# Patient Record
Sex: Male | Born: 1955
Health system: Southern US, Community
[De-identification: ages and names within clinical notes are randomized; demographics above are authoritative.]

## PROBLEM LIST (undated history)

## (undated) DIAGNOSIS — R569 Unspecified convulsions: Secondary | ICD-10-CM

## (undated) DIAGNOSIS — H269 Unspecified cataract: Secondary | ICD-10-CM

## (undated) DIAGNOSIS — F329 Major depressive disorder, single episode, unspecified: Secondary | ICD-10-CM

## (undated) DIAGNOSIS — N179 Acute kidney failure, unspecified: Secondary | ICD-10-CM

## (undated) DIAGNOSIS — N183 Chronic kidney disease, stage 3 unspecified: Secondary | ICD-10-CM

## (undated) DIAGNOSIS — M1990 Unspecified osteoarthritis, unspecified site: Secondary | ICD-10-CM

## (undated) DIAGNOSIS — D649 Anemia, unspecified: Secondary | ICD-10-CM

## (undated) DIAGNOSIS — T7840XA Allergy, unspecified, initial encounter: Secondary | ICD-10-CM

## (undated) DIAGNOSIS — E785 Hyperlipidemia, unspecified: Secondary | ICD-10-CM

## (undated) DIAGNOSIS — Z87442 Personal history of urinary calculi: Secondary | ICD-10-CM

## (undated) DIAGNOSIS — G4733 Obstructive sleep apnea (adult) (pediatric): Secondary | ICD-10-CM

## (undated) DIAGNOSIS — F1910 Other psychoactive substance abuse, uncomplicated: Secondary | ICD-10-CM

## (undated) DIAGNOSIS — I951 Orthostatic hypotension: Secondary | ICD-10-CM

## (undated) DIAGNOSIS — J189 Pneumonia, unspecified organism: Secondary | ICD-10-CM

## (undated) DIAGNOSIS — G4730 Sleep apnea, unspecified: Secondary | ICD-10-CM

## (undated) DIAGNOSIS — F419 Anxiety disorder, unspecified: Secondary | ICD-10-CM

## (undated) DIAGNOSIS — F32A Depression, unspecified: Secondary | ICD-10-CM

## (undated) DIAGNOSIS — I10 Essential (primary) hypertension: Secondary | ICD-10-CM

## (undated) HISTORY — DX: Unspecified osteoarthritis, unspecified site: M19.90

## (undated) HISTORY — DX: Allergy, unspecified, initial encounter: T78.40XA

## (undated) HISTORY — DX: Acute kidney failure, unspecified: N17.9

## (undated) HISTORY — DX: Orthostatic hypotension: I95.1

## (undated) HISTORY — DX: Obstructive sleep apnea (adult) (pediatric): G47.33

## (undated) HISTORY — DX: Major depressive disorder, single episode, unspecified: F32.9

## (undated) HISTORY — DX: Sleep apnea, unspecified: G47.30

## (undated) HISTORY — DX: Unspecified cataract: H26.9

## (undated) HISTORY — PX: COLONOSCOPY: SHX174

## (undated) HISTORY — DX: Essential (primary) hypertension: I10

## (undated) HISTORY — DX: Anemia, unspecified: D64.9

## (undated) HISTORY — DX: Depression, unspecified: F32.A

## (undated) HISTORY — DX: Other psychoactive substance abuse, uncomplicated: F19.10

## (undated) HISTORY — DX: Unspecified convulsions: R56.9

## (undated) HISTORY — DX: Hyperlipidemia, unspecified: E78.5

## (undated) HISTORY — DX: Anxiety disorder, unspecified: F41.9

## (undated) HISTORY — PX: WISDOM TOOTH EXTRACTION: SHX21

---

## 1988-11-15 HISTORY — PX: FRACTURE SURGERY: SHX138

## 1988-11-15 HISTORY — PX: JOINT REPLACEMENT: SHX530

## 1999-08-16 HISTORY — PX: TRACHEOSTOMY: SUR1362

## 1999-10-03 ENCOUNTER — Inpatient Hospital Stay (HOSPITAL_COMMUNITY): Admission: EM | Admit: 1999-10-03 | Discharge: 1999-11-02 | Payer: Self-pay | Admitting: Emergency Medicine

## 1999-10-04 ENCOUNTER — Encounter: Payer: Self-pay | Admitting: Family Medicine

## 1999-10-04 ENCOUNTER — Encounter: Payer: Self-pay | Admitting: *Deleted

## 1999-10-05 ENCOUNTER — Encounter: Payer: Self-pay | Admitting: Pulmonary Disease

## 1999-10-05 ENCOUNTER — Encounter: Payer: Self-pay | Admitting: Gastroenterology

## 1999-10-05 ENCOUNTER — Encounter: Payer: Self-pay | Admitting: Family Medicine

## 1999-10-06 ENCOUNTER — Encounter: Payer: Self-pay | Admitting: Pulmonary Disease

## 1999-10-07 ENCOUNTER — Encounter: Payer: Self-pay | Admitting: Pulmonary Disease

## 1999-10-08 ENCOUNTER — Encounter: Payer: Self-pay | Admitting: Pulmonary Disease

## 1999-10-09 ENCOUNTER — Encounter: Payer: Self-pay | Admitting: Pulmonary Disease

## 1999-10-10 ENCOUNTER — Encounter: Payer: Self-pay | Admitting: Pulmonary Disease

## 1999-10-11 ENCOUNTER — Encounter: Payer: Self-pay | Admitting: Critical Care Medicine

## 1999-10-12 ENCOUNTER — Encounter: Payer: Self-pay | Admitting: Family Medicine

## 1999-10-13 ENCOUNTER — Encounter: Payer: Self-pay | Admitting: Critical Care Medicine

## 1999-10-13 ENCOUNTER — Encounter: Payer: Self-pay | Admitting: Pulmonary Disease

## 1999-10-14 ENCOUNTER — Encounter: Payer: Self-pay | Admitting: Pulmonary Disease

## 1999-10-14 ENCOUNTER — Encounter: Payer: Self-pay | Admitting: Otolaryngology

## 1999-10-15 ENCOUNTER — Encounter: Payer: Self-pay | Admitting: Interventional Cardiology

## 1999-10-15 ENCOUNTER — Encounter: Payer: Self-pay | Admitting: Pulmonary Disease

## 1999-10-16 ENCOUNTER — Encounter: Payer: Self-pay | Admitting: Pulmonary Disease

## 1999-10-17 ENCOUNTER — Encounter: Payer: Self-pay | Admitting: Family Medicine

## 1999-10-18 ENCOUNTER — Encounter: Payer: Self-pay | Admitting: Family Medicine

## 1999-10-19 ENCOUNTER — Encounter: Payer: Self-pay | Admitting: Pulmonary Disease

## 1999-10-20 ENCOUNTER — Encounter: Payer: Self-pay | Admitting: Family Medicine

## 1999-10-21 ENCOUNTER — Encounter: Payer: Self-pay | Admitting: Family Medicine

## 1999-10-22 ENCOUNTER — Encounter: Payer: Self-pay | Admitting: Pulmonary Disease

## 1999-10-22 ENCOUNTER — Encounter: Payer: Self-pay | Admitting: Family Medicine

## 1999-10-24 ENCOUNTER — Encounter: Payer: Self-pay | Admitting: Family Medicine

## 1999-10-26 ENCOUNTER — Encounter: Payer: Self-pay | Admitting: Family Medicine

## 1999-10-28 ENCOUNTER — Encounter: Payer: Self-pay | Admitting: Family Medicine

## 1999-11-02 ENCOUNTER — Encounter: Payer: Self-pay | Admitting: Family Medicine

## 1999-11-10 ENCOUNTER — Encounter: Admission: RE | Admit: 1999-11-10 | Discharge: 1999-11-10 | Payer: Self-pay | Admitting: Family Medicine

## 1999-11-12 ENCOUNTER — Encounter: Admission: RE | Admit: 1999-11-12 | Discharge: 1999-11-12 | Payer: Self-pay | Admitting: Family Medicine

## 1999-11-19 ENCOUNTER — Encounter: Admission: RE | Admit: 1999-11-19 | Discharge: 1999-11-19 | Payer: Self-pay | Admitting: Family Medicine

## 1999-11-24 ENCOUNTER — Encounter: Admission: RE | Admit: 1999-11-24 | Discharge: 1999-12-17 | Payer: Self-pay | Admitting: *Deleted

## 1999-12-11 ENCOUNTER — Encounter: Admission: RE | Admit: 1999-12-11 | Discharge: 1999-12-11 | Payer: Self-pay | Admitting: Family Medicine

## 1999-12-25 ENCOUNTER — Encounter: Admission: RE | Admit: 1999-12-25 | Discharge: 1999-12-25 | Payer: Self-pay | Admitting: Family Medicine

## 1999-12-31 ENCOUNTER — Encounter: Admission: RE | Admit: 1999-12-31 | Discharge: 1999-12-31 | Payer: Self-pay | Admitting: Family Medicine

## 2000-01-04 ENCOUNTER — Encounter: Admission: RE | Admit: 2000-01-04 | Discharge: 2000-01-04 | Payer: Self-pay | Admitting: Family Medicine

## 2000-01-08 ENCOUNTER — Encounter: Admission: RE | Admit: 2000-01-08 | Discharge: 2000-01-08 | Payer: Self-pay | Admitting: Family Medicine

## 2000-01-13 ENCOUNTER — Encounter: Payer: Self-pay | Admitting: Sports Medicine

## 2000-01-13 ENCOUNTER — Encounter: Admission: RE | Admit: 2000-01-13 | Discharge: 2000-01-13 | Payer: Self-pay | Admitting: *Deleted

## 2000-01-18 ENCOUNTER — Encounter: Admission: RE | Admit: 2000-01-18 | Discharge: 2000-01-18 | Payer: Self-pay | Admitting: Family Medicine

## 2000-01-25 ENCOUNTER — Encounter: Admission: RE | Admit: 2000-01-25 | Discharge: 2000-01-25 | Payer: Self-pay | Admitting: Family Medicine

## 2000-02-01 ENCOUNTER — Encounter: Admission: RE | Admit: 2000-02-01 | Discharge: 2000-02-01 | Payer: Self-pay | Admitting: Family Medicine

## 2000-02-11 ENCOUNTER — Encounter: Admission: RE | Admit: 2000-02-11 | Discharge: 2000-02-11 | Payer: Self-pay | Admitting: Family Medicine

## 2000-02-15 ENCOUNTER — Encounter: Admission: RE | Admit: 2000-02-15 | Discharge: 2000-02-15 | Payer: Self-pay | Admitting: Family Medicine

## 2000-02-19 ENCOUNTER — Encounter: Admission: RE | Admit: 2000-02-19 | Discharge: 2000-02-19 | Payer: Self-pay | Admitting: Family Medicine

## 2000-03-04 ENCOUNTER — Encounter: Admission: RE | Admit: 2000-03-04 | Discharge: 2000-03-04 | Payer: Self-pay | Admitting: Family Medicine

## 2000-07-04 ENCOUNTER — Encounter: Admission: RE | Admit: 2000-07-04 | Discharge: 2000-07-04 | Payer: Self-pay | Admitting: Family Medicine

## 2000-07-05 ENCOUNTER — Ambulatory Visit (HOSPITAL_COMMUNITY): Admission: RE | Admit: 2000-07-05 | Discharge: 2000-07-05 | Payer: Self-pay | Admitting: *Deleted

## 2000-07-06 ENCOUNTER — Encounter: Admission: RE | Admit: 2000-07-06 | Discharge: 2000-07-06 | Payer: Self-pay | Admitting: Family Medicine

## 2001-05-09 ENCOUNTER — Encounter: Admission: RE | Admit: 2001-05-09 | Discharge: 2001-05-09 | Payer: Self-pay | Admitting: Family Medicine

## 2001-05-31 ENCOUNTER — Ambulatory Visit (HOSPITAL_COMMUNITY): Admission: RE | Admit: 2001-05-31 | Discharge: 2001-05-31 | Payer: Self-pay | Admitting: *Deleted

## 2001-06-07 ENCOUNTER — Encounter: Admission: RE | Admit: 2001-06-07 | Discharge: 2001-06-07 | Payer: Self-pay | Admitting: Family Medicine

## 2002-09-01 ENCOUNTER — Emergency Department (HOSPITAL_COMMUNITY): Admission: EM | Admit: 2002-09-01 | Discharge: 2002-09-01 | Payer: Self-pay

## 2003-02-05 ENCOUNTER — Encounter: Payer: Self-pay | Admitting: *Deleted

## 2003-02-05 ENCOUNTER — Emergency Department (HOSPITAL_COMMUNITY): Admission: EM | Admit: 2003-02-05 | Discharge: 2003-02-06 | Payer: Self-pay | Admitting: Emergency Medicine

## 2003-11-27 ENCOUNTER — Encounter: Admission: RE | Admit: 2003-11-27 | Discharge: 2003-11-27 | Payer: Self-pay | Admitting: Family Medicine

## 2004-08-16 ENCOUNTER — Ambulatory Visit: Payer: Self-pay | Admitting: Family Medicine

## 2004-08-16 ENCOUNTER — Inpatient Hospital Stay (HOSPITAL_COMMUNITY): Admission: EM | Admit: 2004-08-16 | Discharge: 2004-08-19 | Payer: Self-pay | Admitting: Emergency Medicine

## 2004-09-04 ENCOUNTER — Ambulatory Visit: Payer: Self-pay | Admitting: Family Medicine

## 2004-09-17 ENCOUNTER — Ambulatory Visit: Payer: Self-pay | Admitting: Family Medicine

## 2004-10-01 ENCOUNTER — Emergency Department (HOSPITAL_COMMUNITY): Admission: EM | Admit: 2004-10-01 | Discharge: 2004-10-02 | Payer: Self-pay | Admitting: Emergency Medicine

## 2004-10-09 ENCOUNTER — Emergency Department (HOSPITAL_COMMUNITY): Admission: EM | Admit: 2004-10-09 | Discharge: 2004-10-09 | Payer: Self-pay | Admitting: Emergency Medicine

## 2004-10-19 ENCOUNTER — Emergency Department (HOSPITAL_COMMUNITY): Admission: EM | Admit: 2004-10-19 | Discharge: 2004-10-20 | Payer: Self-pay

## 2004-11-04 ENCOUNTER — Emergency Department (HOSPITAL_COMMUNITY): Admission: EM | Admit: 2004-11-04 | Discharge: 2004-11-05 | Payer: Self-pay | Admitting: Emergency Medicine

## 2004-11-04 ENCOUNTER — Ambulatory Visit: Payer: Self-pay | Admitting: Sports Medicine

## 2004-12-24 ENCOUNTER — Ambulatory Visit: Payer: Self-pay | Admitting: Family Medicine

## 2005-01-21 ENCOUNTER — Ambulatory Visit: Payer: Self-pay | Admitting: Family Medicine

## 2005-03-29 ENCOUNTER — Emergency Department (HOSPITAL_COMMUNITY): Admission: EM | Admit: 2005-03-29 | Discharge: 2005-03-30 | Payer: Self-pay | Admitting: Emergency Medicine

## 2005-07-29 ENCOUNTER — Ambulatory Visit: Payer: Self-pay | Admitting: Family Medicine

## 2005-09-28 ENCOUNTER — Ambulatory Visit: Payer: Self-pay | Admitting: Family Medicine

## 2005-10-27 ENCOUNTER — Ambulatory Visit: Payer: Self-pay | Admitting: Family Medicine

## 2005-10-29 ENCOUNTER — Ambulatory Visit: Payer: Self-pay | Admitting: Family Medicine

## 2005-12-01 ENCOUNTER — Ambulatory Visit: Payer: Self-pay | Admitting: Family Medicine

## 2005-12-09 ENCOUNTER — Ambulatory Visit: Payer: Self-pay | Admitting: Sports Medicine

## 2005-12-23 ENCOUNTER — Ambulatory Visit: Payer: Self-pay | Admitting: Family Medicine

## 2006-01-19 ENCOUNTER — Ambulatory Visit: Payer: Self-pay | Admitting: Family Medicine

## 2006-03-17 ENCOUNTER — Ambulatory Visit: Payer: Self-pay | Admitting: Family Medicine

## 2006-05-27 ENCOUNTER — Ambulatory Visit: Payer: Self-pay | Admitting: Family Medicine

## 2006-09-08 ENCOUNTER — Ambulatory Visit: Payer: Self-pay | Admitting: Family Medicine

## 2006-11-01 ENCOUNTER — Ambulatory Visit: Payer: Self-pay | Admitting: Sports Medicine

## 2007-01-12 DIAGNOSIS — F319 Bipolar disorder, unspecified: Secondary | ICD-10-CM | POA: Insufficient documentation

## 2007-01-12 DIAGNOSIS — G43909 Migraine, unspecified, not intractable, without status migrainosus: Secondary | ICD-10-CM | POA: Insufficient documentation

## 2007-01-12 DIAGNOSIS — F1010 Alcohol abuse, uncomplicated: Secondary | ICD-10-CM | POA: Insufficient documentation

## 2007-01-17 ENCOUNTER — Telehealth: Payer: Self-pay | Admitting: *Deleted

## 2007-03-01 ENCOUNTER — Ambulatory Visit: Payer: Self-pay | Admitting: Family Medicine

## 2007-03-06 ENCOUNTER — Telehealth: Payer: Self-pay | Admitting: Psychology

## 2007-03-10 ENCOUNTER — Telehealth (INDEPENDENT_AMBULATORY_CARE_PROVIDER_SITE_OTHER): Payer: Self-pay | Admitting: Family Medicine

## 2007-03-20 ENCOUNTER — Ambulatory Visit: Payer: Self-pay | Admitting: Psychology

## 2007-04-03 ENCOUNTER — Ambulatory Visit: Payer: Self-pay | Admitting: Family Medicine

## 2007-04-12 ENCOUNTER — Ambulatory Visit: Payer: Self-pay | Admitting: Family Medicine

## 2007-05-10 ENCOUNTER — Ambulatory Visit: Payer: Self-pay | Admitting: Family Medicine

## 2007-05-10 DIAGNOSIS — G40909 Epilepsy, unspecified, not intractable, without status epilepticus: Secondary | ICD-10-CM | POA: Insufficient documentation

## 2007-06-14 ENCOUNTER — Ambulatory Visit: Payer: Self-pay | Admitting: Family Medicine

## 2007-07-10 ENCOUNTER — Emergency Department (HOSPITAL_COMMUNITY): Admission: EM | Admit: 2007-07-10 | Discharge: 2007-07-10 | Payer: Self-pay | Admitting: Emergency Medicine

## 2007-07-29 ENCOUNTER — Inpatient Hospital Stay (HOSPITAL_COMMUNITY): Admission: EM | Admit: 2007-07-29 | Discharge: 2007-08-01 | Payer: Self-pay | Admitting: Emergency Medicine

## 2007-08-29 ENCOUNTER — Ambulatory Visit: Payer: Self-pay | Admitting: Family Medicine

## 2007-08-29 ENCOUNTER — Telehealth: Payer: Self-pay | Admitting: *Deleted

## 2007-08-29 DIAGNOSIS — R55 Syncope and collapse: Secondary | ICD-10-CM

## 2007-09-06 ENCOUNTER — Encounter (INDEPENDENT_AMBULATORY_CARE_PROVIDER_SITE_OTHER): Payer: Self-pay | Admitting: Family Medicine

## 2007-09-06 ENCOUNTER — Ambulatory Visit: Payer: Self-pay | Admitting: Family Medicine

## 2007-09-06 LAB — CONVERTED CEMR LAB
ALT: 14 units/L (ref 0–53)
AST: 12 units/L (ref 0–37)
Albumin: 4.9 g/dL (ref 3.5–5.2)
Alkaline Phosphatase: 51 units/L (ref 39–117)
BUN: 13 mg/dL (ref 6–23)
CO2: 21 meq/L (ref 19–32)
Calcium: 9.9 mg/dL (ref 8.4–10.5)
Chloride: 103 meq/L (ref 96–112)
Cholesterol: 185 mg/dL (ref 0–200)
Creatinine, Ser: 0.96 mg/dL (ref 0.40–1.50)
Glucose, Bld: 100 mg/dL — ABNORMAL HIGH (ref 70–99)
HCT: 42.6 % (ref 39.0–52.0)
HDL: 32 mg/dL — ABNORMAL LOW (ref >39)
Hemoglobin: 13.6 g/dL (ref 13.0–17.0)
LDL Cholesterol: 97 mg/dL (ref 0–99)
MCHC: 31.9 g/dL (ref 30.0–36.0)
MCV: 91.6 fL (ref 78.0–100.0)
Platelets: 411 10*3/uL — ABNORMAL HIGH (ref 150–400)
Potassium: 4.5 meq/L (ref 3.5–5.3)
RBC: 4.65 M/uL (ref 4.22–5.81)
RDW: 14.5 % — ABNORMAL HIGH (ref 11.5–14.0)
Sodium: 137 meq/L (ref 135–145)
TSH: 1.756 microintl units/mL (ref 0.350–5.50)
Total Bilirubin: 0.5 mg/dL (ref 0.3–1.2)
Total CHOL/HDL Ratio: 5.8
Total Protein: 7.5 g/dL (ref 6.0–8.3)
Triglycerides: 279 mg/dL — ABNORMAL HIGH (ref <150)
VLDL: 56 mg/dL — ABNORMAL HIGH (ref 0–40)
WBC: 9.5 10*3/uL (ref 4.0–10.5)

## 2007-10-09 ENCOUNTER — Ambulatory Visit: Payer: Self-pay | Admitting: Family Medicine

## 2007-10-09 DIAGNOSIS — E785 Hyperlipidemia, unspecified: Secondary | ICD-10-CM | POA: Insufficient documentation

## 2007-11-07 ENCOUNTER — Inpatient Hospital Stay (HOSPITAL_COMMUNITY): Admission: EM | Admit: 2007-11-07 | Discharge: 2007-11-14 | Payer: Self-pay | Admitting: Family Medicine

## 2007-11-07 ENCOUNTER — Ambulatory Visit: Payer: Self-pay | Admitting: Family Medicine

## 2007-11-07 ENCOUNTER — Encounter: Payer: Self-pay | Admitting: Emergency Medicine

## 2007-11-08 ENCOUNTER — Encounter: Payer: Self-pay | Admitting: Family Medicine

## 2007-12-14 ENCOUNTER — Ambulatory Visit: Payer: Self-pay | Admitting: Family Medicine

## 2007-12-14 DIAGNOSIS — F17200 Nicotine dependence, unspecified, uncomplicated: Secondary | ICD-10-CM | POA: Insufficient documentation

## 2008-01-02 ENCOUNTER — Ambulatory Visit (HOSPITAL_COMMUNITY): Admission: RE | Admit: 2008-01-02 | Discharge: 2008-01-02 | Payer: Self-pay | Admitting: Family Medicine

## 2008-01-03 ENCOUNTER — Ambulatory Visit: Payer: Self-pay

## 2008-01-04 ENCOUNTER — Telehealth (INDEPENDENT_AMBULATORY_CARE_PROVIDER_SITE_OTHER): Payer: Self-pay | Admitting: Family Medicine

## 2008-01-08 ENCOUNTER — Encounter (INDEPENDENT_AMBULATORY_CARE_PROVIDER_SITE_OTHER): Payer: Self-pay | Admitting: Family Medicine

## 2008-02-07 ENCOUNTER — Ambulatory Visit: Payer: Self-pay | Admitting: Family Medicine

## 2008-02-07 ENCOUNTER — Encounter (INDEPENDENT_AMBULATORY_CARE_PROVIDER_SITE_OTHER): Payer: Self-pay | Admitting: Family Medicine

## 2008-02-07 LAB — CONVERTED CEMR LAB
ALT: 32 units/L (ref 0–53)
AST: 24 units/L (ref 0–37)
Albumin: 4.3 g/dL (ref 3.5–5.2)
Alkaline Phosphatase: 38 units/L — ABNORMAL LOW (ref 39–117)
BUN: 12 mg/dL (ref 6–23)
CO2: 24 meq/L (ref 19–32)
Calcium: 9.5 mg/dL (ref 8.4–10.5)
Chloride: 108 meq/L (ref 96–112)
Cholesterol: 174 mg/dL (ref 0–200)
Creatinine, Ser: 0.84 mg/dL (ref 0.40–1.50)
Glucose, Bld: 88 mg/dL (ref 70–99)
HDL: 38 mg/dL — ABNORMAL LOW (ref >39)
LDL Cholesterol: 105 mg/dL — ABNORMAL HIGH (ref 0–99)
Potassium: 4.5 meq/L (ref 3.5–5.3)
Sodium: 145 meq/L (ref 135–145)
Total Bilirubin: 0.7 mg/dL (ref 0.3–1.2)
Total CHOL/HDL Ratio: 4.6
Total CK: 63 units/L (ref 7–232)
Total Protein: 6.6 g/dL (ref 6.0–8.3)
Triglycerides: 156 mg/dL — ABNORMAL HIGH (ref <150)
VLDL: 31 mg/dL (ref 0–40)

## 2008-02-08 ENCOUNTER — Encounter (INDEPENDENT_AMBULATORY_CARE_PROVIDER_SITE_OTHER): Payer: Self-pay | Admitting: Family Medicine

## 2008-02-12 ENCOUNTER — Telehealth (INDEPENDENT_AMBULATORY_CARE_PROVIDER_SITE_OTHER): Payer: Self-pay | Admitting: *Deleted

## 2008-08-21 ENCOUNTER — Ambulatory Visit: Payer: Self-pay | Admitting: Family Medicine

## 2008-11-27 ENCOUNTER — Ambulatory Visit: Payer: Self-pay | Admitting: Family Medicine

## 2009-04-10 ENCOUNTER — Encounter: Payer: Self-pay | Admitting: Family Medicine

## 2009-04-10 ENCOUNTER — Ambulatory Visit: Payer: Self-pay | Admitting: Family Medicine

## 2009-04-10 DIAGNOSIS — K439 Ventral hernia without obstruction or gangrene: Secondary | ICD-10-CM | POA: Insufficient documentation

## 2009-04-10 LAB — CONVERTED CEMR LAB
ALT: 32 units/L (ref 0–53)
AST: 26 units/L (ref 0–37)
Albumin: 4.4 g/dL (ref 3.5–5.2)
Alkaline Phosphatase: 63 units/L (ref 39–117)
BUN: 2 mg/dL — ABNORMAL LOW (ref 6–23)
CO2: 21 meq/L (ref 19–32)
Calcium: 9.5 mg/dL (ref 8.4–10.5)
Chloride: 98 meq/L (ref 96–112)
Cholesterol: 217 mg/dL — ABNORMAL HIGH (ref 0–200)
Creatinine, Ser: 0.8 mg/dL (ref 0.40–1.50)
Glucose, Bld: 123 mg/dL — ABNORMAL HIGH (ref 70–99)
HDL: 27 mg/dL — ABNORMAL LOW (ref >39)
LDL Cholesterol: 125 mg/dL — ABNORMAL HIGH (ref 0–99)
PSA: 0.53 ng/mL (ref 0.10–4.00)
Potassium: 4.2 meq/L (ref 3.5–5.3)
Sodium: 132 meq/L — ABNORMAL LOW (ref 135–145)
Total Bilirubin: 0.3 mg/dL (ref 0.3–1.2)
Total CHOL/HDL Ratio: 8
Total Protein: 7 g/dL (ref 6.0–8.3)
Triglycerides: 323 mg/dL — ABNORMAL HIGH (ref <150)
VLDL: 65 mg/dL — ABNORMAL HIGH (ref 0–40)

## 2009-05-06 ENCOUNTER — Encounter: Payer: Self-pay | Admitting: Family Medicine

## 2009-06-11 ENCOUNTER — Ambulatory Visit: Payer: Self-pay | Admitting: Family Medicine

## 2009-07-02 ENCOUNTER — Encounter: Payer: Self-pay | Admitting: Family Medicine

## 2009-07-16 HISTORY — PX: HERNIA REPAIR: SHX51

## 2009-07-23 ENCOUNTER — Ambulatory Visit (HOSPITAL_COMMUNITY): Admission: RE | Admit: 2009-07-23 | Discharge: 2009-07-23 | Payer: Self-pay | Admitting: General Surgery

## 2009-07-26 ENCOUNTER — Inpatient Hospital Stay (HOSPITAL_COMMUNITY): Admission: EM | Admit: 2009-07-26 | Discharge: 2009-08-01 | Payer: Self-pay | Admitting: Emergency Medicine

## 2009-07-26 ENCOUNTER — Encounter (INDEPENDENT_AMBULATORY_CARE_PROVIDER_SITE_OTHER): Payer: Self-pay | Admitting: General Surgery

## 2009-08-01 ENCOUNTER — Encounter: Payer: Self-pay | Admitting: Family Medicine

## 2009-09-08 ENCOUNTER — Encounter: Payer: Self-pay | Admitting: Family Medicine

## 2009-09-18 ENCOUNTER — Ambulatory Visit: Payer: Self-pay | Admitting: Family Medicine

## 2009-12-24 ENCOUNTER — Encounter: Payer: Self-pay | Admitting: Family Medicine

## 2010-01-23 ENCOUNTER — Encounter: Payer: Self-pay | Admitting: Family Medicine

## 2010-01-23 LAB — CONVERTED CEMR LAB
ALT: 13 units/L
AST: 28 units/L
Alkaline Phosphatase: 64 units/L
BUN: 8 mg/dL
CO2: 34 meq/L
Calcium: 9.9 mg/dL
Chloride: 79 meq/L
Creatinine, Ser: 1.4 mg/dL
GFR calc non Af Amer: 57 mL/min
Glucose, Bld: 93 mg/dL
HCT: 40.7 %
Hemoglobin: 14.4 g/dL
Hgb A1c MFr Bld: 5.1 %
MCHC: 35.3 g/dL
MCV: 85 fL
Platelets: 379 10*3/uL
Potassium: 3.6 meq/L
RBC: 4.8 M/uL
RDW: 12.2 %
Sodium: 122 meq/L
TSH: 2.72 microintl units/mL
Total Bilirubin: 0.6 mg/dL
Total Protein: 8 g/dL
WBC: 8.2 10*3/uL

## 2010-01-26 ENCOUNTER — Telehealth: Payer: Self-pay | Admitting: Family Medicine

## 2010-01-29 ENCOUNTER — Telehealth: Payer: Self-pay | Admitting: Family Medicine

## 2010-03-04 ENCOUNTER — Encounter: Payer: Self-pay | Admitting: Family Medicine

## 2010-05-12 ENCOUNTER — Ambulatory Visit: Payer: Self-pay | Admitting: Family Medicine

## 2010-05-12 DIAGNOSIS — H938X9 Other specified disorders of ear, unspecified ear: Secondary | ICD-10-CM | POA: Insufficient documentation

## 2010-05-14 ENCOUNTER — Telehealth: Payer: Self-pay | Admitting: Family Medicine

## 2010-05-28 ENCOUNTER — Encounter: Payer: Self-pay | Admitting: Family Medicine

## 2010-05-28 ENCOUNTER — Ambulatory Visit: Payer: Self-pay | Admitting: Family Medicine

## 2010-09-03 ENCOUNTER — Encounter: Payer: Self-pay | Admitting: Family Medicine

## 2010-09-03 ENCOUNTER — Ambulatory Visit: Payer: Self-pay | Admitting: Family Medicine

## 2010-09-03 LAB — CONVERTED CEMR LAB
Cholesterol: 212 mg/dL — ABNORMAL HIGH (ref 0–200)
HDL: 32 mg/dL — ABNORMAL LOW (ref >39)
LDL Cholesterol: 122 mg/dL — ABNORMAL HIGH (ref 0–99)
Total CHOL/HDL Ratio: 6.6
Triglycerides: 289 mg/dL — ABNORMAL HIGH (ref <150)
VLDL: 58 mg/dL — ABNORMAL HIGH (ref 0–40)

## 2010-09-11 ENCOUNTER — Encounter: Payer: Self-pay | Admitting: Family Medicine

## 2010-09-11 ENCOUNTER — Telehealth (INDEPENDENT_AMBULATORY_CARE_PROVIDER_SITE_OTHER): Payer: Self-pay | Admitting: *Deleted

## 2010-09-15 ENCOUNTER — Encounter: Payer: Self-pay | Admitting: Family Medicine

## 2010-10-13 ENCOUNTER — Encounter (INDEPENDENT_AMBULATORY_CARE_PROVIDER_SITE_OTHER): Payer: Self-pay | Admitting: *Deleted

## 2010-12-02 ENCOUNTER — Ambulatory Visit: Admission: RE | Admit: 2010-12-02 | Discharge: 2010-12-02 | Payer: Self-pay | Source: Home / Self Care

## 2010-12-17 NOTE — Miscellaneous (Signed)
Summary: Medical record request  Clinical Lists Changes  Rec'd medical record request to go to: [facility] date sent: 08/05/10 Marily Memos  October 13, 2010 3:24 PM
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
## 2010-12-17 NOTE — Progress Notes (Signed)
Summary: Rx Req  Phone Note Refill Request Call back at [phone] Message from:  Patient  Refills Requested: Medication #1:  IBUPROFEN 600 MG TABS one tab by mouth q6 hours as needed headache. [facility].  Initial call taken by: Clydell Hakim,  May 14, 2010 9:56 AM    Prescriptions: IBUPROFEN 600 MG TABS (IBUPROFEN) one tab by mouth q6 hours as needed headache.  #90 x 3   Entered and Authorized by:   Lequita Asal  MD   Signed by:   Lequita Asal  MD on 05/14/2010   Method used:   Electronically to        [facility]  [phone]* (retail)       [address]       [place], [place]  [address]       Ph: [phone] or [phone]       Fax: [phone]   RxID:   [id number]

## 2010-12-17 NOTE — Assessment & Plan Note (Signed)
Summary: F/U   Vital Signs:  Patient profile:   55 year old male Height:      98.6 inches Weight:      196.5 pounds BMI:     14.26 Temp:     98.6 degrees F oral Pulse rate:   104 / minute BP sitting:   126 / 86  (left arm) Cuff size:   regular  Vitals Entered By: Barnabas Lister  MD (May 28, 2010 3:16 PM) CC: F/U Ears Is Patient Diabetic? No Pain Assessment Patient in pain? no        Primary Provider:  Barnabas Lister  MD  CC:  F/U Ears.  History of Present Illness: CC: F/u ear discomfort from 2 weeks ago, improved  HPI: Pt came to clinic 2 weeks ago for ear discomfort.  C/o ringing and itchiness.  Pt was given Zytrec 10mg  by mouth once a day.  Pt says medication has improved his symptoms and says he is doing well.  ROS: Denies headache, ear pain, ear discharge, hearing impairment, sinus congestion.  Current Medications (verified): 1)  Depakote 500 Mg  Tbec (Divalproex Sodium) .Marland Kitchen.. 1 Tabs By Mouth Bid 2)  Venlafaxine Hcl 75 Mg Tabs (Venlafaxine Hcl) .... One Tab By Mouth By Mouth Bid 3)  Thiamine Hcl 100 Mg  Tabs (Thiamine Hcl) .Marland Kitchen.. 1 By Mouth Daily 4)  Mens Multivitamin Plus   Tabs (Multiple Vitamins-Minerals) .Marland Kitchen.. 1 By Mouth Daily 5)  Benztropine Mesylate 1 Mg  Tabs (Benztropine Mesylate) .Marland Kitchen.. 1 By Mouth Bid 6)  Ibuprofen 600 Mg Tabs (Ibuprofen) .... One Tab By Mouth Q6 Hours As Needed Headache. 7)  Keppra 750 Mg Tabs (Levetiracetam) .... One Tab By Mouth Bid 8)  Risperdal 2 Mg Tabs (Risperidone) .... One Tab By Mouth Qhs 9)  Risperdal 1 Mg Tabs (Risperidone) .... One Tab By Mouth Qam 10)  Cetirizine Hcl 10 Mg Tabs (Cetirizine Hcl) .... One Tab By Mouth Daily (Same As Zyrtec)  Allergies (verified): 1)  ! Erythromycin  Review of Systems       per hpi  Physical Exam  General:  Well-developed,well-nourished,in no acute distress; alert,appropriate and cooperative throughout examination Head:  Normocephalic and atraumatic without obvious abnormalities. No apparent  alopecia or balding. Ears:  External ear exam shows no significant lesions or deformities.  Otoscopic examination reveals clear canals, tympanic membranes are intact bilaterally without bulging, retraction, inflammation or discharge. Hearing is grossly normal bilaterally.   Impression & Recommendations:  Problem # 1:  OTHER DISORDERS OF EAR (ICD-388.8) Assessment Improved  Pt states the ear discomfort has improved.  Discussed with patient the results of his hearing exam which was normal.  Patient denied any ear tenderness, itching, or discharge.  Patient will continue to take Zyrtec 10 mg by mouth daily.  Advised to return to clinic if ear symptoms return or symptoms become worse.    Orders: [facility]- Est Level  3 ([address])  Complete Medication List: 1)  Depakote 500 Mg Tbec (Divalproex sodium) .Marland Kitchen.. 1 tabs by mouth bid 2)  Venlafaxine Hcl 75 Mg Tabs (Venlafaxine hcl) .... One tab by mouth by mouth bid 3)  Thiamine Hcl 100 Mg Tabs (Thiamine hcl) .Marland Kitchen.. 1 by mouth daily 4)  Mens Multivitamin Plus Tabs (Multiple vitamins-minerals) .Marland Kitchen.. 1 by mouth daily 5)  Benztropine Mesylate 1 Mg Tabs (Benztropine mesylate) .Marland Kitchen.. 1 by mouth bid 6)  Ibuprofen 600 Mg Tabs (Ibuprofen) .... One tab by mouth q6 hours as needed headache. 7)  Keppra  750 Mg Tabs (Levetiracetam) .... One tab by mouth bid 8)  Risperdal 2 Mg Tabs (Risperidone) .... One tab by mouth qhs 9)  Risperdal 1 Mg Tabs (Risperidone) .... One tab by mouth qam 10)  Cetirizine Hcl 10 Mg Tabs (Cetirizine hcl) .... One tab by mouth daily (same as zyrtec)  Patient Instructions: 1)  Pt to return if ear discomfort returns or becomes worse. 2)  Advised to make an appointment with PCP to discuss preventative medicine and smoking cessation.

## 2010-12-17 NOTE — Assessment & Plan Note (Signed)
Summary: f/u & flu shot,df   Vital Signs:  Patient profile:   55 year old male Height:      98.6 inches Weight:      190.8 pounds BMI:     13.85 Temp:     98.1 degrees F oral Pulse rate:   92 / minute BP sitting:   149 / 75  (left arm) Cuff size:   regular  Vitals Entered By: Garen Grams LPN (September 03, 2010 1:56 PM) CC: f/u Is Patient Diabetic? No Pain Assessment Patient in pain? no        Primary Provider:  Barnabas Lister  MD  CC:  f/u.  History of Present Illness: This is a 55 yo AAM who is here for his flu shot and to get his cholesterol levels checked.  His last FLP was over one year ago and his cholesterol, TG, and LDL were high, and HDL low.  Dr.  Lanier Prude that he advised to watch his diet, especially carbs, to help reduce his elevated TG.  Pt does not have any complaints.  No hospitalizations since last visit.  Needs refill for Ibuprofen 600 for low back pain.  At last visit, pt c/o ringing and pain in his ear.  He says this has improved with Zyrtec.  Pt has no other complaints.  ROS: Denies CP, SOB, fever, chills, NS.  Denies N/V, constipation/diarrhea, abdominal pain, dysuria.  Does endorse low back pain and headaches.  Preventive Screening-Counseling & Management  Alcohol-Tobacco     Smoking Status: current     Smoking Cessation Counseling: yes     Packs/Day: 1.5     Tobacco Counseling: to quit use of tobacco products  Current Medications (verified): 1)  Venlafaxine Hcl 75 Mg Tabs (Venlafaxine Hcl) .... One Tab By Mouth By Mouth Bid 2)  Thiamine Hcl 100 Mg  Tabs (Thiamine Hcl) .Marland Kitchen.. 1 By Mouth Daily 3)  Mens Multivitamin Plus   Tabs (Multiple Vitamins-Minerals) .Marland Kitchen.. 1 By Mouth Daily 4)  Benztropine Mesylate 1 Mg  Tabs (Benztropine Mesylate) .Marland Kitchen.. 1 By Mouth Bid 5)  Ibuprofen 600 Mg Tabs (Ibuprofen) .... One Tab By Mouth Q6 Hours As Needed Headache. 6)  Keppra 500 Mg Tabs (Levetiracetam) .... 2 Tab By Mouth Bid 7)  Risperdal 2 Mg Tabs (Risperidone) .... One Tab  By Mouth Qhs 8)  Risperdal 1 Mg Tabs (Risperidone) .... One Tab By Mouth Qam 9)  Cetirizine Hcl 10 Mg Tabs (Cetirizine Hcl) .... One Tab By Mouth Daily (Same As Zyrtec)  Allergies: 1)  ! Erythromycin  Review of Systems       per HPI  Physical Exam  General:  Well-developed,well-nourished,in no acute distress Lungs:  Normal respiratory effort, chest expands symmetrically. Lungs are clear to auscultation, no crackles or wheezes. Heart:  Normal rate and regular rhythm. S1 and S2 normal without gallop, murmur, click, rub or other extra sounds. Extremities:  No clubbing, cyanosis, edema Neurologic:  No cranial nerve deficits noted. Station and gait are normal.   Impression & Recommendations:  Problem # 1:  HYPERLIPIDEMIA (ICD-272.4) Most recent FLP showed elevated TG and total cholesterol, and low HDL.  Advised pt to continue to diet and increase physical activity.  WIll get a FLP today, and call patient with results.  Will decide if pt needs to take meds for hyperlipidemia.  Currently, he does not take any statins ro fish oils.  Orders: Lipid-[facility] ([id number]) [facility]- Est Level  3 ([address])Future Orders: Comp Met-[facility] ([id number]) ... 09/10/2011 A1C-[facility] (  [address]) ... 09/10/2011  Problem # 2:  TOBACCO ABUSE (ICD-305.1) Discussed the importance of cutting down on tobacco use.  Pt still packs 2 packs/week.  Pt says he is not ready to quit smoking.  Advised pt of the many resources available to him in the [facility] clinic.  He would like to discuss this further at his next visit.    Complete Medication List: 1)  Venlafaxine Hcl 75 Mg Tabs (Venlafaxine hcl) .... One tab by mouth by mouth bid 2)  Thiamine Hcl 100 Mg Tabs (Thiamine hcl) .Marland Kitchen.. 1 by mouth daily 3)  Mens Multivitamin Plus Tabs (Multiple vitamins-minerals) .Marland Kitchen.. 1 by mouth daily 4)  Benztropine Mesylate 1 Mg Tabs (Benztropine mesylate) .Marland Kitchen.. 1 by mouth bid 5)  Ibuprofen 600 Mg Tabs (Ibuprofen) .... One tab by mouth q6 hours as needed  headache. 6)  Keppra 500 Mg Tabs (Levetiracetam) .... 2 tab by mouth bid 7)  Risperdal 2 Mg Tabs (Risperidone) .... One tab by mouth qhs 8)  Risperdal 1 Mg Tabs (Risperidone) .... One tab by mouth qam 9)  Cetirizine Hcl 10 Mg Tabs (Cetirizine hcl) .... One tab by mouth daily (same as zyrtec)  Other Orders: Future Orders: CBC-[facility] ([address]454) ... 09/10/2011  Patient Instructions: 1)  It was great to see you today. 2)  Please schedule a complete physical exam in March 2012. 3)  Please schedule a lab appointment for blood work 1 week prior to your CPE. 4)  Thanks! Prescriptions: IBUPROFEN 600 MG TABS (IBUPROFEN) one tab by mouth q6 hours as needed headache.  #90 x 3   Entered and Authorized by:   Ivy de Lawson Radar  MD   Signed by:   Barnabas Lister  MD on 09/03/2010   Method used:   Print then Give to Patient   RxID:   [id number]    Orders Added: 1)  Lipid-[facility] [[id number]] 2)  Comp Met-[facility] [[id number]] 3)  A1C-[facility] [83036] 4)  CBC-[facility] [85027] 5)  [facility]- Est Level  3 [62952]

## 2010-12-17 NOTE — Progress Notes (Signed)
Summary: triage  Phone Note Call from Patient Call back at [phone]   Caller: Patient Summary of Call: Pt needs to talk to a nurse about an adjustment that the [facility] has requested for him to go from 3 a day to just 1 a day. Initial call taken by: Clydell Hakim,  January 26, 2010 3:25 PM  Follow-up for Phone Call        left message Follow-up by: Golden Circle RN,  January 26, 2010 3:27 PM  Additional Follow-up for Phone Call Additional follow up Details #1::        [facility] wants his dose changed of the effexor. They are faxing info about that. told him I will forward it to his md when we receive it Additional Follow-up by: Golden Circle RN,  January 26, 2010 3:40 PM
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
## 2010-12-17 NOTE — Progress Notes (Signed)
Summary: refill  Phone Note Refill Request Call back at [phone] Message from:  Patient  Refills Requested: Medication #1:  CETIRIZINE HCL 10 MG TABS one tab by mouth daily (same as zyrtec). Initial call taken by: De Nurse,  September 11, 2010 8:53 AM  Follow-up for Phone Call        patient notified that Rx has been sent.. Follow-up by: Theresia Lo RN,  September 11, 2010 10:39 AM

## 2010-12-17 NOTE — Consult Note (Signed)
Summary: [facility]  [facility]   Imported By: Knox Royalty 09/18/2010 09:37:51  _____________________________________________________________________  External Attachment:    Type:   Image     Comment:   External Document

## 2010-12-17 NOTE — Letter (Signed)
Summary: Generic Letter  [facility]  [address]   [place], [place] [address]   Phone: [phone]  Fax: [phone]    09/15/2010  Jose Li [address] [place], [place]  [address]  Dear Jose Li,  I hope this letter finds you well.  I have reviewed the results of your most recent lab work.  Your total cholesterol and triglycerides are elevated.  However, your LDL (bad cholesterol) is within normal limits.  At this time, I would encourage you to avoid foods that are high in cholesterol and high in sugars/carbohydrates.  Also, please increase your physical activity as tolerated.  We can discuss other lifesyle modifications at your next office visit.  Tests: (1) Lipid Profile (69629)   Cholesterol          [H]  212 mg/dL                   5-284     ATP III Classification:           < 200        mg/dL        Desirable          200 - 239     mg/dL        Borderline High          >= 240        mg/dL        High         Triglyceride         [H]  289 mg/dL                   <132   HDL Cholesterol      [L]  32 mg/dL                    >44   Total Chol/HDL Ratio      6.6 Ratio  VLDL Cholesterol (Calc)                        [H]  58 mg/dL                    0-10  LDL Cholesterol (Calc)                        [H]  122 mg/dL                   2-72      Sincerely,   Ivy de Lawson Radar  MD  Appended Document: Generic Letter letter mailed

## 2010-12-17 NOTE — Progress Notes (Signed)
Summary: triage  Phone Note Call from Patient Call back at [phone]   Caller: Patient Summary of Call: Checking on the medication that hopefully will be at a reduced price. Initial call taken by: Clydell Hakim,  January 29, 2010 2:18 PM  Follow-up for Phone Call        he wants to be notified when this is done. told him his pcp will be here in am & will handle.Golden Circle RN  January 29, 2010 2:37 PM  Follow-up by: Golden Circle RN,  January 29, 2010 2:35 PM  Additional Follow-up for Phone Call Additional follow up Details #1::        not sure what patient is referring to. only recent correspondence had to do with effexor, and i dont prescribe that medication. Additional Follow-up by: Lequita Asal  MD,  January 29, 2010 11:05 PM    Additional Follow-up for Phone Call Additional follow up Details #2::    Dr. Manson Passey at [facility] center & a nurse Madaline Savage told him to get med from pcp from now on & that pcp could change if desired for lower cost to pcp Follow-up by: Golden Circle RN,  January 30, 2010 9:29 AM  Additional Follow-up for Phone Call Additional follow up Details #3:: Details for Additional Follow-up Action Taken: effexor changed immediate release formulation (which is less expensive) 50 mg twice a day. patient will need to have BMP in 4-6 weeks to evaluate sodium level (which is reason for medication adjustment). i am unwilling to change to another medication all together because patient has been tried on several in the past.  Additional Follow-up by: Lequita Asal  MD,  January 30, 2010 10:08 AM  New/Updated Medications: VENLAFAXINE HCL 50 MG TABS (VENLAFAXINE HCL) one tab by mouth bid Prescriptions: VENLAFAXINE HCL 50 MG TABS (VENLAFAXINE HCL) one tab by mouth bid  #60 x 3   Entered and Authorized by:   Lequita Asal  MD   Signed by:   Lequita Asal  MD on 01/30/2010   Method used:   Electronically to        [facility]  [phone]* (retail)       [address]       [place], [place]  [address]       Ph: [phone] or [phone]       Fax: [phone]   RxID:   [id number]  pt informed of above.Golden Circle RN  January 30, 2010 11:33 AM

## 2010-12-17 NOTE — Miscellaneous (Signed)
  Clinical Lists Changes  Medications: Removed medication of AMBIEN 10 MG  TABS (ZOLPIDEM TARTRATE) 1 by mouth at bedtime prn

## 2010-12-17 NOTE — Consult Note (Signed)
Summary: [facility]   [facility]   Imported By: Clydell Hakim 03/05/2010 12:04:22  _____________________________________________________________________  External Attachment:    Type:   Image     Comment:   External Document

## 2010-12-17 NOTE — Miscellaneous (Signed)
Summary: Labs  Clinical Lists Changes  Observations: Added new observation of TSH: 2.720 microintl units/mL (01/23/2010 8:57) Added new observation of HGBA1C: 5.1 % (01/23/2010 8:57) Added new observation of PLATELETK/UL: 379 K/uL (01/23/2010 8:57) Added new observation of RDW: 12.2 % (01/23/2010 8:57) Added new observation of MCHC RBC: 35.3 g/dL (13/06/6577 4:69) Added new observation of MCV: 85 fL (01/23/2010 8:57) Added new observation of HCT: 40.7 % (01/23/2010 8:57) Added new observation of HGB: 14.4 g/dL (62/95/2841 3:24) Added new observation of RBC M/UL: 4.8 M/uL (01/23/2010 8:57) Added new observation of WBC COUNT: 8.2 10*3/microliter (01/23/2010 8:57) Added new observation of CALCIUM: 9.9 mg/dL (40/08/2724 3:66) Added new observation of PROTEIN, TOT: 8.0 g/dL (44/01/4741 5:95) Added new observation of SGPT (ALT): 13 units/L (01/23/2010 8:57) Added new observation of SGOT (AST): 28 units/L (01/23/2010 8:57) Added new observation of ALK PHOS: 64 units/L (01/23/2010 8:57) Added new observation of BILI TOTAL: 0.6 mg/dL (63/87/5643 3:29) Added new observation of GFR: 57 mL/min (01/23/2010 8:57) Added new observation of CREATININE: 1.4 mg/dL (51/88/4166 0:63) Added new observation of BUN: 8 mg/dL (01/60/1093 2:35) Added new observation of BG RANDOM: 93 mg/dL (57/32/2025 4:27) Added new observation of CO2 PLSM/SER: 34 meq/L (01/23/2010 8:57) Added new observation of CL SERUM: 79 meq/L (01/23/2010 8:57) Added new observation of K SERUM: 3.6 meq/L (01/23/2010 8:57) Added new observation of NA: 122 meq/L (01/23/2010 8:57)

## 2011-02-11 ENCOUNTER — Ambulatory Visit (INDEPENDENT_AMBULATORY_CARE_PROVIDER_SITE_OTHER): Payer: Medicaid Other | Admitting: Family Medicine

## 2011-02-11 ENCOUNTER — Encounter: Payer: Self-pay | Admitting: Family Medicine

## 2011-02-11 VITALS — BP 121/80 | HR 104 | Temp 97.2°F | Ht 72.0 in | Wt 204.0 lb

## 2011-02-11 DIAGNOSIS — Z0000 Encounter for general adult medical examination without abnormal findings: Secondary | ICD-10-CM

## 2011-02-11 MED ORDER — ATORVASTATIN CALCIUM 20 MG PO TABS: 20.0000 mg | ORAL_TABLET | Freq: Every day | ORAL | Status: DC

## 2011-02-11 MED ORDER — PROPRANOLOL HCL ER 60 MG PO CP24: 60.0000 mg | ORAL_CAPSULE | Freq: Every day | ORAL | Status: DC

## 2011-02-11 NOTE — Patient Instructions (Signed)
It was great to see you today. Please schedule an appointment with me in 6 weeks to follow-up migraines. Two new prescriptions have been sent to your pharmacy. Please take Lipitor for high cholesterol as directed. Please take Inderal to prevent migraines as directed. Please call me if you have any questions or concerns. Thanks, Dr. Sherron Flemings Sondra Come

## 2011-02-12 ENCOUNTER — Telehealth: Payer: Self-pay | Admitting: *Deleted

## 2011-02-12 ENCOUNTER — Telehealth: Payer: Self-pay | Admitting: Family Medicine

## 2011-02-12 NOTE — Telephone Encounter (Signed)
Needs to get PA for his Lipitor - [facility]- [address]

## 2011-02-12 NOTE — Telephone Encounter (Signed)
PA required for Atorvastatin. Form placed in MD box. 

## 2011-02-15 DIAGNOSIS — Z0000 Encounter for general adult medical examination without abnormal findings: Secondary | ICD-10-CM | POA: Insufficient documentation

## 2011-02-15 NOTE — Assessment & Plan Note (Signed)
Patient's FLP: cholesterol 218, TG 326, HDL 27, LDL 126.  Will start statin today and will repeat FLP in 6 months.  Patient does not want to schedule a colonscopy at this time.  Will discuss importance again at next visit.  Patient's bipolar DO is managed by Psychiatry - seems stable.  Epilepsy stable on current meds.  Migraine HA still bothersome.  He was taking Motrin 3 days/week for HA.  Will start Inderal today and follow-up in 4-6 weeks.   If this does not prevent HA, may consider Topamax low dose.  Will follow.

## 2011-02-15 NOTE — Progress Notes (Signed)
  Subjective:    Patient ID: Jose Li, male    DOB: 1956/05/25, 55 y.o.   MRN: [id number]  HPI This is a 55 yo male who is here for an annual physical.  Patient has no complaints today.  He denies any recent illnesses, ED visits or hospitalizations.  He recently had lab work done and wants to review it with PCP.  Patient continues to smoke 1/2 ppd and has no desire to quit at this time.  In the past, patient hesitated to start taking any statins.  He does not want to schedule a colonoscopy yet.       Review of Systems Denies any fever, chills, NS, CP, SOB.  Denies HA, cough, weight loss, constipation/diarrhea, or bloody stool.    Objective:   Physical Exam  Constitutional: He appears well-developed and well-nourished.  HENT:  Head: Normocephalic and atraumatic.  Mouth/Throat: No oropharyngeal exudate.  Eyes: EOM are normal. Pupils are equal, round, and reactive to light.  Neck: Normal range of motion. Neck supple.  Cardiovascular: Normal rate and regular rhythm.  Exam reveals no gallop and no friction rub.   No murmur heard. Pulmonary/Chest: Effort normal and breath sounds normal. He has no wheezes. He has no rales.  Abdominal: Soft. Bowel sounds are normal. He exhibits distension. There is no tenderness. There is no rebound and no guarding.  Musculoskeletal: He exhibits no edema.  Lymphadenopathy:    He has no cervical adenopathy.  Skin: Skin is warm and dry.          Assessment & Plan:

## 2011-02-17 NOTE — Telephone Encounter (Signed)
Approval received . Form placed in MD box.

## 2011-02-17 NOTE — Telephone Encounter (Signed)
Approval recieved . Pharmacy notified.

## 2011-02-19 LAB — COMPREHENSIVE METABOLIC PANEL
ALT: 80 U/L — ABNORMAL HIGH (ref 0–53)
AST: 51 U/L — ABNORMAL HIGH (ref 0–37)
AST: 26 U/L (ref 0–37)
Albumin: 2.7 g/dL — ABNORMAL LOW (ref 3.5–5.2)
Albumin: 2.9 g/dL — ABNORMAL LOW (ref 3.5–5.2)
Albumin: 3.5 g/dL (ref 3.5–5.2)
Alkaline Phosphatase: 58 U/L (ref 39–117)
Alkaline Phosphatase: 66 U/L (ref 39–117)
Alkaline Phosphatase: 37 U/L — ABNORMAL LOW (ref 39–117)
BUN: 6 mg/dL (ref 6–23)
BUN: 7 mg/dL (ref 6–23)
BUN: 12 mg/dL (ref 6–23)
BUN: 13 mg/dL (ref 6–23)
BUN: 3 mg/dL — ABNORMAL LOW (ref 6–23)
CO2: 22 mEq/L (ref 19–32)
CO2: 22 mEq/L (ref 19–32)
CO2: 25 mEq/L (ref 19–32)
Calcium: 8.8 mg/dL (ref 8.4–10.5)
Calcium: 8 mg/dL — ABNORMAL LOW (ref 8.4–10.5)
Chloride: 100 mEq/L (ref 96–112)
Chloride: 101 mEq/L (ref 96–112)
Creatinine, Ser: 0.7 mg/dL (ref 0.4–1.5)
Creatinine, Ser: 0.74 mg/dL (ref 0.4–1.5)
Creatinine, Ser: 0.83 mg/dL (ref 0.4–1.5)
Creatinine, Ser: 0.85 mg/dL (ref 0.4–1.5)
Creatinine, Ser: 0.87 mg/dL (ref 0.4–1.5)
GFR calc Af Amer: >60 mL/min (ref >60)
GFR calc Af Amer: >60 mL/min (ref >60)
GFR calc non Af Amer: >60 mL/min (ref >60)
GFR calc non Af Amer: >60 mL/min (ref >60)
GFR calc non Af Amer: >60 mL/min (ref >60)
Glucose, Bld: 90 mg/dL (ref 70–99)
Glucose, Bld: 85 mg/dL (ref 70–99)
Glucose, Bld: 98 mg/dL (ref 70–99)
Potassium: 3.6 mEq/L (ref 3.5–5.1)
Potassium: 4.1 mEq/L (ref 3.5–5.1)
Potassium: 3.5 mEq/L (ref 3.5–5.1)
Sodium: 130 mEq/L — ABNORMAL LOW (ref 135–145)
Total Bilirubin: 0.6 mg/dL (ref 0.3–1.2)
Total Bilirubin: 0.7 mg/dL (ref 0.3–1.2)
Total Protein: 6.2 g/dL (ref 6.0–8.3)
Total Protein: 5.9 g/dL — ABNORMAL LOW (ref 6.0–8.3)
Total Protein: 6.3 g/dL (ref 6.0–8.3)

## 2011-02-19 LAB — VALPROIC ACID LEVEL
Valproic Acid Lvl: 91.5 ug/mL (ref 50.0–100.0)
Valproic Acid Lvl: 83 ug/mL (ref 50.0–100.0)

## 2011-02-19 LAB — CARDIAC PANEL(CRET KIN+CKTOT+MB+TROPI)
Relative Index: 1.5 (ref 0.0–2.5)
Relative Index: 1.6 (ref 0.0–2.5)
Total CK: 185 U/L (ref 7–232)
Troponin I: 0.01 ng/mL (ref 0.00–0.06)

## 2011-02-19 LAB — URINALYSIS, ROUTINE W REFLEX MICROSCOPIC
Bilirubin Urine: NEGATIVE
Glucose, UA: NEGATIVE mg/dL
Hgb urine dipstick: NEGATIVE
Ketones, ur: NEGATIVE mg/dL
Nitrite: NEGATIVE
Nitrite: NEGATIVE
Protein, ur: NEGATIVE mg/dL
Specific Gravity, Urine: 1.026 (ref 1.005–1.030)
Urobilinogen, UA: 1 mg/dL (ref 0.0–1.0)
pH: 6.5 (ref 5.0–8.0)

## 2011-02-19 LAB — AMMONIA
Ammonia: 32 umol/L (ref 11–35)
Ammonia: 43 umol/L — ABNORMAL HIGH (ref 11–35)

## 2011-02-19 LAB — GRAM STAIN

## 2011-02-19 LAB — DIFFERENTIAL
Basophils Absolute: 0 10*3/uL (ref 0.0–0.1)
Basophils Relative: 0 % (ref 0–1)
Eosinophils Relative: 2 % (ref 0–5)
Lymphocytes Relative: 14 % (ref 12–46)
Lymphocytes Relative: 19 % (ref 12–46)
Lymphs Abs: 1.6 10*3/uL (ref 0.7–4.0)
Monocytes Absolute: 1.3 10*3/uL — ABNORMAL HIGH (ref 0.1–1.0)
Monocytes Relative: 14 % — ABNORMAL HIGH (ref 3–12)
Monocytes Relative: 20 % — ABNORMAL HIGH (ref 3–12)
Neutro Abs: 5.7 10*3/uL (ref 1.7–7.7)
Neutro Abs: 7 10*3/uL (ref 1.7–7.7)
Neutrophils Relative %: 67 % (ref 43–77)

## 2011-02-19 LAB — CBC
HCT: 36.5 % — ABNORMAL LOW (ref 39.0–52.0)
HCT: 30.5 % — ABNORMAL LOW (ref 39.0–52.0)
HCT: 31.2 % — ABNORMAL LOW (ref 39.0–52.0)
HCT: 35.1 % — ABNORMAL LOW (ref 39.0–52.0)
HCT: 36.1 % — ABNORMAL LOW (ref 39.0–52.0)
Hemoglobin: 12.7 g/dL — ABNORMAL LOW (ref 13.0–17.0)
Hemoglobin: 10.3 g/dL — ABNORMAL LOW (ref 13.0–17.0)
Hemoglobin: 10.6 g/dL — ABNORMAL LOW (ref 13.0–17.0)
Hemoglobin: 12.3 g/dL — ABNORMAL LOW (ref 13.0–17.0)
MCHC: 34.8 g/dL (ref 30.0–36.0)
MCHC: 33.7 g/dL (ref 30.0–36.0)
MCHC: 35 g/dL (ref 30.0–36.0)
MCHC: 35.5 g/dL (ref 30.0–36.0)
MCV: 90.3 fL (ref 78.0–100.0)
MCV: 89.5 fL (ref 78.0–100.0)
MCV: 90.1 fL (ref 78.0–100.0)
MCV: 90.4 fL (ref 78.0–100.0)
MCV: 91.4 fL (ref 78.0–100.0)
Platelets: 369 10*3/uL (ref 150–400)
Platelets: 242 10*3/uL (ref 150–400)
RBC: 4.04 MIL/uL — ABNORMAL LOW (ref 4.22–5.81)
RBC: 3.33 MIL/uL — ABNORMAL LOW (ref 4.22–5.81)
RDW: 14 % (ref 11.5–15.5)
RDW: 13.5 % (ref 11.5–15.5)
RDW: 13.8 % (ref 11.5–15.5)
RDW: 14.2 % (ref 11.5–15.5)
RDW: 14.3 % (ref 11.5–15.5)
WBC: 10.9 10*3/uL — ABNORMAL HIGH (ref 4.0–10.5)
WBC: 8.8 10*3/uL (ref 4.0–10.5)

## 2011-02-19 LAB — WOUND CULTURE: Culture: NO GROWTH

## 2011-02-19 LAB — CLOSTRIDIUM DIFFICILE EIA: C difficile Toxins A+B, EIA: NEGATIVE

## 2011-02-19 LAB — CULTURE, BLOOD (ROUTINE X 2): Culture: NO GROWTH

## 2011-02-19 LAB — ANAEROBIC CULTURE

## 2011-02-19 LAB — OVA AND PARASITE EXAMINATION

## 2011-02-19 LAB — BASIC METABOLIC PANEL
BUN: 11 mg/dL (ref 6–23)
Calcium: 7.8 mg/dL — ABNORMAL LOW (ref 8.4–10.5)
Creatinine, Ser: 0.81 mg/dL (ref 0.4–1.5)
GFR calc non Af Amer: >60 mL/min (ref >60)
Glucose, Bld: 87 mg/dL (ref 70–99)
Potassium: 3.4 mEq/L — ABNORMAL LOW (ref 3.5–5.1)

## 2011-02-19 LAB — GLUCOSE, CAPILLARY: Glucose-Capillary: 100 mg/dL — ABNORMAL HIGH (ref 70–99)

## 2011-02-19 LAB — STOOL CULTURE

## 2011-02-19 LAB — RAPID URINE DRUG SCREEN, HOSP PERFORMED
Amphetamines: NOT DETECTED
Benzodiazepines: NOT DETECTED
Tetrahydrocannabinol: NOT DETECTED

## 2011-03-30 ENCOUNTER — Ambulatory Visit (INDEPENDENT_AMBULATORY_CARE_PROVIDER_SITE_OTHER): Payer: Medicaid Other | Admitting: Family Medicine

## 2011-03-30 ENCOUNTER — Encounter: Payer: Self-pay | Admitting: Family Medicine

## 2011-03-30 DIAGNOSIS — Z0000 Encounter for general adult medical examination without abnormal findings: Secondary | ICD-10-CM

## 2011-03-30 DIAGNOSIS — E785 Hyperlipidemia, unspecified: Secondary | ICD-10-CM

## 2011-03-30 DIAGNOSIS — G43909 Migraine, unspecified, not intractable, without status migrainosus: Secondary | ICD-10-CM
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
## 2011-03-30 NOTE — Progress Notes (Signed)
  Subjective:    Patient ID: Jose [name], male    DOB: 1956-07-03, 55 y.o.   MRN: [id number]  HPI This is a 55 year old male who presents to clinic with CC: follow migraine HA.  Patient has been taking Inderal daily for 2 months now.  Says HA are becoming less frequent ~ once a week.  Migraines no longer wake him up at night.  He used to wake up with HA, but that has now resolved.  Wants to continue taking medication.  Started Lipitor 2 months ago.  Complains of occasional muscle cramps/aches in bilateral LE.  This does not bother patient.  He wants to continue Lipitor.  Patient wants a referral for colonoscopy today.   Review of Systems Denies any fever, chills, NS, nausea/vomting, or CP.  Denies any seizure activity.  Endorses occasional muscle aches in bil LE, headache, and chronic back pain.    Objective:   Physical Exam  Constitutional: He appears well-developed and well-nourished.  Cardiovascular: Normal rate and regular rhythm.  Exam reveals no gallop and no friction rub.   No murmur heard. Pulmonary/Chest: Breath sounds normal. No respiratory distress. He has no wheezes. He has no rales.  Neurological: He is alert.  Psychiatric: He has a normal mood and affect.          Assessment & Plan:

## 2011-03-30 NOTE — H&P (Signed)
Jose [name], Jose [name]                  ACCOUNT NO.:  [id number]   MEDICAL RECORD NO.:  [id number]          PATIENT TYPE:  EMS   LOCATION:  MAJO                         FACILITY:  [facility]   PHYSICIAN:  Mobolaji B. Bakare, M.D.DATE OF BIRTH:  1956-11-02   DATE OF ADMISSION:  07/29/2007  DATE OF DISCHARGE:                              HISTORY & PHYSICAL   PRIMARY CARE PHYSICIAN:  Ralene Ok, M.D.   CHIEF COMPLAINT:  Change in mental status.   HISTORY OF PRESENTING COMPLAINT:  Jose [name] is a 55 year old Caucasian  male with significant history of bipolar disorder and seizure disorder.  He has been on multiple medications.  He was in his usual state of  health until early this morning about 3:30 a.m. when his mom heard a  bang.  They suspected that he could have fallen, but they rushed to the  site of the bang and the patient was in the bathroom on the toilet seat.  He was confused, uncooperative, and was not answering questions.  He was  responding to all questions by saying no, no.  His blood glucose was  122 per EMS at the time of arrival.  Blood pressure and temperature were  normal in the emergency room.   Further details obtained from family reveal that the patient had used  all his medications prior to time he went to bed.  Just before he went  to bed he went outside the house for some time and came back in to  sleep.  Of note is that his alcohol level was 22 on arrival here.  The  patient was noted by EMS to be smelling of alcohol.  The patient used  sedating medications including trazodone, Risperdal, and Remeron prior  to going to bed.  His urine drug screen showed no abnormality.  Acetaminophen level and salicylate level were normal.   REVIEW OF SYSTEMS:  Not obtainable from the patient, but there was no  report of fever, cough, shortness of breath, or chest pain.   PAST MEDICAL HISTORY:  1. Visual disorder secondary to head injury in 1990.  2. Bipolar disorder.  3. Bilobar  pneumonia in 2000 requiring intubation and tracheostomy.  4. History of DVT.  5. History of superficial thrombophlebitis.  6. Chronically elevated GTT.  7. Migraine headaches.   PAST SURGICAL HISTORY:  1. History of right hip fracture in the 1990s status post open      reduction internal fixation.  2. C3-C4 fusion in the 1990s.   CURRENT MEDICATIONS:  1. Hydrochlorothiazide 25 mg daily.  2. Trazodone 100 mg one-half to one tablet q.h.s.  3. Depakote ER 500 mg two in the morning and four at bedtime.  4. Remeron 30 mg q.h.s.  5. Risperdal 3 mg q.h.s.  6. Zoloft 100 mg two tablets daily.   ALLERGIES:  No known drug allergies.   FAMILY HISTORY:  Significant for myocardial infarction in mom, who had  MI at an older age.  One brother passed away from a massive heart attack  at the age of 37, 7 years ago.  Both  maternal and paternal grandfathers  had myocardial infarction.  Both parents are alive.   SOCIAL HISTORY:  The patient resides with his family and his parents  have been taking good care of him.  He smokes cigarettes and drinks  alcohol.  Father stated that he used to drink heavily in the past but  stopped 2 years ago and he is not aware that the patient started  drinking again.   INITIAL VITAL SIGNS:  Temperature 97.0, blood pressure 135/86, pulse of  105, respiratory rate is 24, O2 saturations of 95% on room air.   EXAMINATION:  The patient is drowsy, arousable with sternal rub, and he  responds by saying leave me alone.  He moves all his limbs.  His  pupils are equal, round and reactive to light, 6 mm bilaterally.  No  elevated JVD, no carotid bruit.  Mucous membranes moist.  LUNGS:  Clear clinically to auscultation.  CARDIOVASCULAR:  S1, S2.  Regular.  No murmur, gallop or rub.  ABDOMEN:  Not distended.  Soft, nontender.  Bowel sounds present.  EXTREMITIES:  No pedal edema or calf tenderness.  Dorsalis pedis pulse  palpable bilaterally 2+.  CENTRAL NERVOUS SYSTEM:  No  focal weakness.   INITIAL LABORATORY DATA:  Valproic acid level 51.8.  Urine drug screen:  None detected.  Urinalysis shows white blood cells 3-6, red blood cells  11-20, bacteria rare, granular casts.  There is moderate hemoglobin,  negative nitrite and leukocyte esterase.  Salicylate level less than 4.  Alcohol level 22.  Acetaminophen level less than 10.  Sodium 134,  potassium 3.1, chloride 97, bicarb 21, glucose 106, BUN 5, creatinine  0.84, calcium 9.0, total protein 6.8, albumin 3.9, AST 21, ALT 15,  alkaline phosphatase 34, total bilirubin 0.4.  White cells 6.6,  hemoglobin 12.7, hematocrit 36.4, platelets 343.  Normal differential.   Head CT scan showed no acute abnormalities.  EKG unavailable.   ASSESSMENT AND PLAN:  Jose [name] is a 55 year old Caucasian male with  bipolar disorder and seizure disorder on multiple medications.  He  apparently must have had some alcohol intake yesterday night.  His  alcohol level is 22.  The patient is now presenting with diminished  level of consciousness.  His head CT scan showed no acute abnormality.  He will be admitted for further evaluation and management.   ADMISSION DIAGNOSES:  1. Altered mental status, likely secondary to alcohol intoxication on      the background of antipsychotic medications which he used last      night (Risperdal, trazodone, Remeron).  We will also need to rule      out postictal confusion.  The patient will be admitted to      telemetry, obtain an EEG, place on seizure and fall precautions, IV      fluids D5-normal saline at 125 mL/hour.  Neurologic checks q.2h.      for 24 hours.  2. Hypokalemia.  Will replete with three rounds of 10 mEq KCl and also      add potassium chloride to IV fluids.  3. Bipolar disorder.  Will hold antipsychotic medication until the      patient wakes up and we can reassess.  4. Seizure disorder.  This change in mental status may reflect      postictal state but there was no witnessed  seizure.  Will obtain an      EEG.  Will resume Depakote IV (Depacon).  5. Alcohol intoxication.  Will  give thiamine, multivitamin and folate.      Offer alcohol cessation counseling.  6. Hypertension.  Blood pressure is currently fairly controlled.  Will      resume hydrochlorothiazide when the patient is fully awake.      Alternatively, may use clonidine patch if necessary.  7. Tobacco abuse. Nicotine patch 21 mg daily.  Will offer tobacco      cessation counseling.      Mobolaji B. Corky Downs, M.D.  Electronically Signed     MBB/MEDQ  D:  07/29/2007  T:  07/29/2007  Job:  [id number]   cc:   Ralene Ok, M.D.  [name], M.D.

## 2011-03-30 NOTE — Assessment & Plan Note (Signed)
Started Lipitor 2 months ago.  Complains of occasional muscle cramps/aches in bilateral LE.  Not bothersome.  Patient wants to continue taking Lipitor.  Will follow up in 6 months and check LFTs at that time.  Counseled patient about the risks of smoking and hyperlipidemia.  Patient has no desire to quit smoking.

## 2011-03-30 NOTE — Procedures (Signed)
EEG NUMBER:  [id number].   HISTORY:  The patient is a 55 year old with bipolar affective disorder  and seizures.  The patient was admitted due to an episode where he fell,  followed by confusion, being uncooperative and not answering questions,  (780.02, 293.0).   PROCEDURE:  The tracing is carried out on a 32-channel digital Cadwell  recorder reformatted into 16 channel montages with one devoted to EKG.  The patient was awake and drowsy during the recording.  The  International 10/20 system of lead placement was used.   MEDICATIONS:  Lovenox, Protonix, nicotine Depakote, Risperdal, Zoloft,  Zofran, Ventolin, vitamin B12 and Tylenol.   DESCRIPTION OF FINDINGS:  Dominant frequency is a 10-15 microvolt 8-Hz  alpha range-activity that is broadly distributed.  Background activity  shows predominately alpha and frontally-predominant beta-range  components with rare upper theta-range activity.  There was no focal  slowing.  There was no interictal epileptiform activity in the form of  spikes or sharp waves.  Periods of drowsiness occurred during the  record, where somewhat greater rhythmic theta was seen.   IMPRESSION:  Normal record with the patient awake and drowsy.      Deanna Artis. Sharene Skeans, M.D.  Electronically Signed     WGN:FAOZ  D:  07/31/2007 17:45:13  T:  08/01/2007 08:34:12  Job #:  [id number]   cc:   Barnetta Chapel, MD

## 2011-03-30 NOTE — Discharge Summary (Signed)
Jose [name], Jose [name]                  ACCOUNT NO.:  [id number]   MEDICAL RECORD NO.:  [id number]          PATIENT TYPE:  INP   LOCATION:  3705                         FACILITY:  [facility]   PHYSICIAN:  Hettie Holstein, D.O.    DATE OF BIRTH:  1956/03/17   DATE OF ADMISSION:  07/29/2007  DATE OF DISCHARGE:  08/01/2007                               DISCHARGE SUMMARY   PRIMARY CARE PHYSICIAN:  Dr. Ludwig Clarks.   PRIMARY NEUROLOGIST:  [name], M.D.   FINAL DIAGNOSIS:  1. Altered mentation, resolved, probably related to alcohol abuse.  2. Seizure disorder.  3. Hypokalemia, resolved.  4. Bipolar disorder.  5. History of deep venous thrombosis.  6. History of alcohol abuse.   MEDICATIONS ON DISCHARGE:  1. He was instructed to resume his Depakote ER 500 mg 2 tablets in the      morning, Depakote 500 mg 4 tablets in the evening.  2. He was instructed STOP taking hydrochlorothiazide.  3. He was to resume his mirtazapine 30 mg p.o. nightly.  4. Risperdal 3 mg p.o. nightly.  5. Sertraline 100 mg 2 tablets in the morning.  6,  He was instructed to STOP his evening dose of trazodone.  1. Continue aspirin 81 mg daily.  2. A multivitamin daily.   He was instructed to followup with Dr. Anne Hahn in 1-2 weeks and Dr.  Ludwig Clarks as well in 1-2 weeks.   DISPOSITION:  Jose [name] was discharged in medically stable condition.   HISTORY OF PRESENTING ILLNESS AND HOSPITAL COURSE:  [name] is a 55-  year-old male with a history significant for bipolar disorder and  seizure disorder who had had apparent changes in his mental status.  He  was in his usual state of health in the morning and at 3:30 a.m. his  mother heard a noise, and she suspected that the patient could have  fallen.  They noticed the patient to be in the bathroom on the toilet.  He was confused, uncooperative, and was not answer questions.  In any  event, he was brought to the emergency department.  He was suspected by  the family to have  had some alcohol, as it was reported by family that  he had used all of his medications prior to the time, he went to bed.  Just before he went to bed he went outside of the house for some time,  he came back in to sleep.  Upon arrival to the emergency department his  alcohol level was 22.  He was noted by EMS to be smelling of alcohol, in  combination with sedating medications, and recent adjustments by his  primary psychiatrist of his sedating medications.  He was admitted and  observed.  His mental status resolved to the baseline.  He became asymptomatic and hemodynamically stable.  He was ambulatory  without difficulty.  He underwent EEG, as described above, that was  normal per final impression as read by Dr. Sharene Skeans.  He was provided  with tobacco cessation information, and ultimately he was felt to be  stable for  discharge home.      Hettie Holstein, D.O.  Electronically Signed     ESS/MEDQ  D:  08/27/2007  T:  08/28/2007  Job:  [id number]   cc:   Deanna Artis. Sharene Skeans, M.D.  Dr. Ludwig Clarks

## 2011-03-30 NOTE — Assessment & Plan Note (Signed)
Started patient on Inderal for migraine prophylaxis 2 months ago.  Now patient says migraine HA are less frequent.  Has a HA once a week.  Able to sleep through the night without waking up in pain.  Still takes Ibuprofen as needed for HA and back pain.  Takes about 3 tablets 5 days/week.  Will continue current regimen.  Patient's HR and BP are stable.  No adverse effects noted.

## 2011-03-30 NOTE — Patient Instructions (Addendum)
It was great to see you today. Please continue to take medications as directed. I am happy to hear your migraines are getting better. Please try to cut back on smoking: goal 3-4 cigarettes per day. Continue to walk and exercise to improve back pain.  Continue to take Motrin as needed. Please call [facility] to schedule appointment for colonoscopy. Please return to clinic in 6 months. Thanks, Dr. Sherron Flemings Sondra Come

## 2011-04-02 ENCOUNTER — Telehealth: Payer: Self-pay | Admitting: Family Medicine

## 2011-04-02 DIAGNOSIS — Z0000 Encounter for general adult medical examination without abnormal findings: Secondary | ICD-10-CM
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
## 2011-04-02 NOTE — Op Note (Signed)
[facility]  Patient:    Jose Li                          MRN: [id number] Proc. Date: 10/14/99 Adm. Date:  [id number] Attending:  Tobin Chad                           Operative Report  PREOPERATIVE DIAGNOSIS: 1. Chronic respiratory failure. 2. Chronic ventilatory dependent. 3. Acute respiratory distress syndrome.  POSTOPERATIVE DIAGNOSIS: 1. Chronic respiratory failure. 2. Chronic ventilatory dependent. 3. Acute respiratory distress syndrome.  SURGICAL PROCEDURE:  Tracheostomy.  ANESTHESIA:  General endotracheal.  SURGEON:  Kinnie Scales. Annalee Genta, M.D.  COMPLICATIONS:  None.  ESTIMATED BLOOD LOSS:  Minimal.  DISPOSITION:  The patient is transferred from the operating room to Unit 2100 in stable condition.  DRAINS:  A #8 Shiley tracheostomy tube was placed without difficulty.  BRIEF HISTORY:  [name] is a 55 year old white male admitted to [facility] on October 03, 1999, with progressive respiratory distress.  This necessitated intubation and ventilatory support and he developed pneumococcal pneumonia, sepsis, and ARDS.  The patient was unable to wean from the ventilator and the ENT service was consulted for placement of the tracheostomy.  The risks, benefits, and possible complications of the surgical procedure were discussed in detail with the patients family.  They understood and concurred with our plan for surgery.  DESCRIPTION OF THE PROCEDURE:  The patient was brought to the operating room on  October 14, 1999, and placed in the supine position on the operating table. General endotracheal anesthesia was established via the patients existing endotracheal tube.  With the patient adequately anesthetized, he was prepped and draped in a sterile fashion and injected with 1 cc of 1% lidocaine, 1:100,000 solution epinephrine in the anterior neck skin.  After allowing adequate time for vasoconstriction, the  surgical procedure was begun using a #15 scalpel and creating a 2 cm horizontal skin incision in the anterior neck.  The subcutaneous tissue as dissected.  Strap muscles were identified, divided in the midline, and lateralized. The thyroid isthmus was identified and suture ligated with 2-0 chromic suture.  This allowed direct access to the anterior tracheal wall.  The trachea was entered at the second tracheal interspace using a #15 scalpel blade followed by Bovie cautery and a curved Mayo scissors.  The tracheotomy was created using a U-shaped inferioly-based tracheal flap.  Superior and inferior stay sutures were then placed consisting of 2-0 silk suture.  A #8 Shiley tracheostomy tube was then inserted  without difficulty as the endotracheal tube was removed.  The tracheostomy tube was sutured into position using a 3-0 Ethilon suture and Velcro tracheostomy ties.  Xeroform gauze was used to dress the tracheostomy site.  The patient was then awakened from his anesthetic.  He was transferred from the operating room to unit 2100 in stable condition.  There were no complications and estimated blood loss was minimal. DD:  10/14/99 TD:  10/15/99 Job: [id number]

## 2011-04-02 NOTE — Discharge Summary (Signed)
[facility]  Patient:    Jose Li                          MRN: [id number] Adm. Date:  [id number] Disc. Date: 11/02/99 Attending:  Tobin Chad Dictator:   Doren Custard, M.D. CC:         Danice Goltz, M.D.                           Discharge Summary  DISCHARGE DIAGNOSES:  1. Bilateral Pneumococcal pneumonia.  2. Severe septic shock.  3. Severe adult respiratory distress syndrome.  4. Right pneumothorax secondary to ______ trauma.  5. Left pleural effusion.  6. Right lower extremity deep venous thrombosis.  7. History of seizure disorder.  8. Severe deconditioning.  9. Malnutrition. 10. Nosocomial sepsis. 11. History of tobacco abuse. 12. History of alcohol abuse. 13. Electrolyte abnormalities/resolved. 14. Thrombocytosis, resolved.  DISCHARGE MEDICATIONS:  1. Dilantin 300 mg p.o. q.d.  2. Klonopin 1 mg p.o. q.o.d. x 6 days.  3. Albuterol MDI two puffs q.6h. p.r.n.  4. Ultram 15 mg one to two q.6h. p.r.n.  5. Coumadin 7.5 mg p.o. q.d.  CONSULTS:  1. Pulmonary critical care, Dr. Sung Amabile, Dr. Jayme Cloud.  2. CVTS, Dr. Allyson Sabal.  3. ENT, Dr. Annalee Genta.  4. Nutrition.  5. Speech therapy, occupational therapy, physical therapy.  PROCEDURES:  1. Intubation.  2. Right femoral line.  3. Right anterior chest tube.  4. Right posterior chest tube.  5. Right subclavian line.  6. Left subclavian line.  7. Tracheotomy.  8. FEES speech evaluation.  9. Radiographic left thoracentesis.  BRIEF HISTORY:  This is a 55 year old male who presented with a one-week history of worsening shortness of breath, subjective fever, productive cough, myalgias and  weakness.  He was febrile to 103.6, had a heart rate of 150, respiratory rate of 30, satting 91% on room air.  BRIEF HOSPITAL COURSE: #1 - PULMONARY:  Patient was admitted on November 18 with bilateral pneumonia found to be secondary to Pneumococcus.  He rapidly decompensated requiring  intubation on November 19.  Pulmonary critical care was consulted.  Patient developed ARDS and underwent a "mini-code" on November 19 with severe hypoxemia, tachycardia and decrease in respiratory function, development of sepsis.  Patient was on Levophed, dopamine, and epinephrine drip to sustain heart function.  Patient was also treated with ceftriaxone, vancomycin.  Patient developed a pneumothorax, treated with chest tubes and continued on a ventilator.  Patient had a tracheostomy placed on October 14, 1999.  He was continued with aggressive pulmonary toilet and respiratory treatment.  Patient was ventilator dependent until October 19, 1999 at which time he has undergone a chronic weaning protocol.  Until his day of discharge, he has been decannulated without problem, satting 99% on room air, very comfortable with a good pulmonary exam and chest x-ray showing a persistent left pleural effusion ut good overall aeration.  #2 - SEVERE SEPTIC SHOCK SECONDARY TO PNEUMOCOCCAL PNEUMONIA:  Treated aggressively with antibiotics, required pressors and intensive care.  Patient made a truly remarkable recovery and has been off antibiotics for two weeks.  At the time of his discharge, he was afebrile, feeling well, and making an excellent recovery.  #3 - RIGHT LOWER EXTREMITY DEEP VENOUS THROMBOSIS:  Treated initially with Lovenox but  now is on Coumadin therapeutic, will go home to complete a 3-6 month course of  Coumadin.  #4 - HISTORY OF SEIZURE DISORDER:  Was treated with phenytoin during this admission and will be discharged home on a lower dose of phenytoin after discussion with is neurologist, Dr. Noreene Filbert.  He will go home on Dilantin 300 mg p.o. q.d.  #5 - MALNUTRITION:  Patient was fed via a feeding tube until he was extubated, swallowing function improved and he was sent home with no dietary restrictions er speech recommendations.  He was tolerating food well and  beginning to gain weight upon discharge.  #6 - SEVERE DECONDITIONING:  Patient underwent aggressive physical therapy, occupational therapy and speech therapy.  This will be continued on an outpatient basis.  #7 - HISTORY OF TOBACCO ABUSE:  It was imperative that this patient not smoke again.  This was stressed to him repeatedly.  He agreed to comply with this.  #8 - HISTORY OF ALCOHOL ABUSE:  Again this was stressed that it was imperative hat he no longer drink.  He agreed that he will no longer drink.  #9 - ELECTROLYTE ABNORMALITIES, THROMBOCYTOSIS:  These were resolved by the time of his discharge.  #10 - LEFT PLEURAL EFFUSION:  This persisted and will be followed as an outpatient.  DISCHARGE CONDITION:  Greatly improved and stable.  Afebrile, vital signs stable. Patient is ambulatory.  Mental status is alert and oriented x 4, Folstein mini-mental status exam revealed 30 out of 30, eager to go home.  FOLLOWUP:  Patient will follow up with Dr. Wilma Flavin at [facility] on November 24, 1999, at 1:30 p.m.  Patient will have a PT and INR drawn as an outpatient by the home health R.N.  Also have one drawn in the clinic on November 12, 1999, have Coumadin dose adjusted if needed. DD:  11/02/99 TD:  11/03/99 Job: [id number]

## 2011-04-02 NOTE — Telephone Encounter (Signed)
He called [facility] to make appt for his colonoscopy and they told him that they needed a referral from [place].
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
## 2011-04-05 NOTE — Telephone Encounter (Signed)
This pt does not have an order for this.Loralee [place]

## 2011-04-06 ENCOUNTER — Telehealth: Payer: Self-pay | Admitting: Family Medicine

## 2011-04-06 NOTE — Telephone Encounter (Signed)
Will change order from virtual CT colonoscopy to regular screening colonoscopy.

## 2011-04-06 NOTE — Telephone Encounter (Signed)
Patient asking for RN to schedule his colonoscopy, says everyone he has called requires a referral.

## 2011-04-09 ENCOUNTER — Other Ambulatory Visit: Payer: Self-pay | Admitting: Family Medicine

## 2011-04-09 DIAGNOSIS — Z0000 Encounter for general adult medical examination without abnormal findings: Secondary | ICD-10-CM

## 2011-04-09 NOTE — Telephone Encounter (Signed)
LVM for patient to call back. Just wanting to verify if he has had a colonoscopy anywhere else before I schedule him one.Erma Pinto

## 2011-04-09 NOTE — Telephone Encounter (Signed)
Pt called back, has never had a colonoscopy.

## 2011-04-15 NOTE — Telephone Encounter (Signed)
Pt called again to see about appointment.

## 2011-04-15 NOTE — Telephone Encounter (Signed)
Spoke with patient and informed him of colonoscopy set up at [facility] w/ Dr. Marina Goodell. 6/13 @ 3:30pm pre-procedure visit. 6/27 @ 11:30am patient to arrive @ 10:30am. Patient given address and phone number of practice if needing to cancel or reschedule. [address] 3rd floor. [phone]. Patient agreed and understands information

## 2011-04-28 ENCOUNTER — Ambulatory Visit (AMBULATORY_SURGERY_CENTER): Payer: Medicaid Other | Admitting: *Deleted

## 2011-04-28 VITALS — Ht 72.0 in | Wt 204.0 lb

## 2011-04-28 DIAGNOSIS — Z1211 Encounter for screening for malignant neoplasm of colon: Secondary | ICD-10-CM

## 2011-04-28 MED ORDER — PEG-KCL-NACL-NASULF-NA ASC-C 100 G PO SOLR: ORAL | Status: DC

## 2011-05-04 ENCOUNTER — Ambulatory Visit (AMBULATORY_SURGERY_CENTER): Payer: Medicaid Other | Admitting: Internal Medicine

## 2011-05-04 ENCOUNTER — Encounter: Payer: Self-pay | Admitting: Internal Medicine

## 2011-05-04 ENCOUNTER — Other Ambulatory Visit: Payer: Self-pay | Admitting: Family Medicine

## 2011-05-04 VITALS — BP 117/67 | HR 69 | Temp 98.3°F | Resp 18 | Ht 73.0 in | Wt 204.0 lb

## 2011-05-04 DIAGNOSIS — K5730 Diverticulosis of large intestine without perforation or abscess without bleeding: Secondary | ICD-10-CM

## 2011-05-04 DIAGNOSIS — Z1211 Encounter for screening for malignant neoplasm of colon: Secondary | ICD-10-CM

## 2011-05-04 MED ORDER — SODIUM CHLORIDE 0.9 % IV SOLN: 500.0000 mL | INTRAVENOUS | Status: DC

## 2011-05-04 NOTE — Patient Instructions (Addendum)
Please refer to blue and green discharge instruction sheets. It's very important NOT to drink or drive today.  Follow all discharge instructions. Continue your medications.  Next Colonoscopy in 10 years.

## 2011-05-05 ENCOUNTER — Telehealth: Payer: Self-pay

## 2011-05-05 NOTE — Telephone Encounter (Signed)
Refill request for ibuprofen for Dr. Tye Savoy patient . I refilled cetizirine . Will forward to MD to ask about refill on ibuprofen.

## 2011-05-05 NOTE — Telephone Encounter (Signed)
No id on answering machine  

## 2011-05-12 ENCOUNTER — Other Ambulatory Visit: Payer: Medicaid Other | Admitting: Internal Medicine

## 2011-06-02 ENCOUNTER — Other Ambulatory Visit: Payer: Self-pay | Admitting: Family Medicine

## 2011-06-02 NOTE — Telephone Encounter (Signed)
Refill request

## 2011-08-11 ENCOUNTER — Ambulatory Visit: Payer: Medicaid Other

## 2011-08-13 ENCOUNTER — Ambulatory Visit: Payer: Medicaid Other

## 2011-08-18 ENCOUNTER — Ambulatory Visit (INDEPENDENT_AMBULATORY_CARE_PROVIDER_SITE_OTHER): Payer: Medicaid Other

## 2011-08-18 DIAGNOSIS — Z23 Encounter for immunization: Secondary | ICD-10-CM

## 2011-08-20 LAB — COMPREHENSIVE METABOLIC PANEL
ALT: 23
AST: 24
Albumin: 3.9
Alkaline Phosphatase: 38 — ABNORMAL LOW
BUN: 6
CO2: 21
Calcium: 9.5
Chloride: 112
Creatinine, Ser: 0.93
GFR calc Af Amer: >60
GFR calc non Af Amer: >60
Glucose, Bld: 91
Potassium: 4.2
Sodium: 145
Total Bilirubin: 0.7
Total Protein: 6.8

## 2011-08-20 LAB — BASIC METABOLIC PANEL
BUN: 7
CO2: 23
CO2: 23
CO2: 24
Calcium: 9
Chloride: 105
Chloride: 109
Chloride: 110
Chloride: 112
Creatinine, Ser: 1.02
GFR calc Af Amer: >60
GFR calc Af Amer: >60
GFR calc Af Amer: >60
GFR calc non Af Amer: >60
Glucose, Bld: 110 — ABNORMAL HIGH
Potassium: 3.2 — ABNORMAL LOW
Potassium: 3.2 — ABNORMAL LOW
Potassium: 3.4 — ABNORMAL LOW
Potassium: 4.3
Sodium: 139
Sodium: 140

## 2011-08-20 LAB — URINALYSIS, ROUTINE W REFLEX MICROSCOPIC
Glucose, UA: NEGATIVE
Leukocytes, UA: NEGATIVE
Protein, ur: NEGATIVE
Specific Gravity, Urine: 1.016
Urobilinogen, UA: 0.2

## 2011-08-20 LAB — RAPID URINE DRUG SCREEN, HOSP PERFORMED
Barbiturates: NOT DETECTED
Benzodiazepines: NOT DETECTED

## 2011-08-20 LAB — DIFFERENTIAL
Basophils Absolute: 0
Basophils Relative: 1
Eosinophils Absolute: 0 — ABNORMAL LOW
Eosinophils Relative: 0
Lymphocytes Relative: 9 — ABNORMAL LOW
Lymphs Abs: 0.7
Monocytes Absolute: 0.3
Monocytes Relative: 3
Neutro Abs: 7
Neutrophils Relative %: 87 — ABNORMAL HIGH

## 2011-08-20 LAB — CBC
HCT: 35.9 — ABNORMAL LOW
HCT: 34.3 — ABNORMAL LOW
Hemoglobin: 12.4 — ABNORMAL LOW
Hemoglobin: 11.7 — ABNORMAL LOW
MCHC: 34.5
MCV: 89.6
Platelets: 307
RBC: 4.01 — ABNORMAL LOW
RDW: 13.6
RDW: 13.4
WBC: 7.4

## 2011-08-20 LAB — VALPROIC ACID LEVEL: Valproic Acid Lvl: 67.1

## 2011-08-20 LAB — URINE MICROSCOPIC-ADD ON

## 2011-08-20 LAB — ETHANOL: Alcohol, Ethyl (B): 26 — ABNORMAL HIGH

## 2011-08-26 LAB — BASIC METABOLIC PANEL
BUN: 7
CO2: 24
Chloride: 109
GFR calc non Af Amer: >60
Glucose, Bld: 93
Potassium: 4.6
Sodium: 139

## 2011-08-27 LAB — SALICYLATE LEVEL: Salicylate Lvl: <4

## 2011-08-27 LAB — COMPREHENSIVE METABOLIC PANEL
ALT: 15
AST: 21
CO2: 21
Chloride: 97
Creatinine, Ser: 0.84
GFR calc Af Amer: >60
GFR calc non Af Amer: >60
Glucose, Bld: 106 — ABNORMAL HIGH
Total Bilirubin: 0.4

## 2011-08-27 LAB — I-STAT 8, (EC8 V) (CONVERTED LAB)
BUN: 7
Bicarbonate: 26.2 — ABNORMAL HIGH
Glucose, Bld: 103 — ABNORMAL HIGH
Hemoglobin: 14.6
Potassium: 3 — ABNORMAL LOW
Sodium: 135

## 2011-08-27 LAB — BASIC METABOLIC PANEL
CO2: 25
Chloride: 104
GFR calc non Af Amer: >60
Glucose, Bld: 97
Potassium: 2.8 — ABNORMAL LOW
Sodium: 136

## 2011-08-27 LAB — CBC
HCT: 33 — ABNORMAL LOW
Hemoglobin: 11.6 — ABNORMAL LOW
Hemoglobin: 12.7 — ABNORMAL LOW
MCHC: 35.1
MCV: 86.3
MCV: 86.6
RBC: 4.18 — ABNORMAL LOW
RDW: 13.6
WBC: 6.6

## 2011-08-27 LAB — DIFFERENTIAL
Basophils Absolute: 0
Basophils Absolute: 0
Eosinophils Absolute: 0
Eosinophils Relative: 0
Eosinophils Relative: 1
Lymphocytes Relative: 18
Lymphocytes Relative: 35
Monocytes Absolute: 0.6
Neutrophils Relative %: 73

## 2011-08-27 LAB — RAPID URINE DRUG SCREEN, HOSP PERFORMED: Barbiturates: NOT DETECTED

## 2011-08-27 LAB — URINALYSIS, ROUTINE W REFLEX MICROSCOPIC
Glucose, UA: NEGATIVE
Leukocytes, UA: NEGATIVE
pH: 8

## 2011-08-27 LAB — URINE MICROSCOPIC-ADD ON

## 2011-09-16 ENCOUNTER — Encounter: Payer: Self-pay | Admitting: Family Medicine

## 2011-09-16 ENCOUNTER — Ambulatory Visit (INDEPENDENT_AMBULATORY_CARE_PROVIDER_SITE_OTHER): Payer: Medicaid Other | Admitting: Family Medicine

## 2011-09-16 DIAGNOSIS — G40909 Epilepsy, unspecified, not intractable, without status epilepticus: Secondary | ICD-10-CM

## 2011-09-16 DIAGNOSIS — E785 Hyperlipidemia, unspecified: Secondary | ICD-10-CM

## 2011-09-16 DIAGNOSIS — F319 Bipolar disorder, unspecified: Secondary | ICD-10-CM

## 2011-09-16 NOTE — Patient Instructions (Signed)
It was good to see you, Jose Li. I will send you a letter with the recent lab results. I will also fax results to your other providers. Please schedule follow up appointment in 6 months.

## 2011-09-17 LAB — COMPREHENSIVE METABOLIC PANEL
ALT: 12 U/L (ref 0–53)
AST: 14 U/L (ref 0–37)
Alkaline Phosphatase: 60 U/L (ref 39–117)
BUN: 2 mg/dL — ABNORMAL LOW (ref 6–23)
Calcium: 9.1 mg/dL (ref 8.4–10.5)
Chloride: 103 mEq/L (ref 96–112)
Creat: 0.89 mg/dL (ref 0.50–1.35)
Total Bilirubin: 0.4 mg/dL (ref 0.3–1.2)

## 2011-09-17 LAB — LIPID PANEL
Cholesterol: 151 mg/dL (ref 0–200)
HDL: 28 mg/dL — ABNORMAL LOW (ref >39)
Triglycerides: 240 mg/dL — ABNORMAL HIGH (ref <150)
VLDL: 48 mg/dL — ABNORMAL HIGH (ref 0–40)

## 2011-09-17 LAB — CBC WITH DIFFERENTIAL/PLATELET
Basophils Absolute: 0.1 10*3/uL (ref 0.0–0.1)
Basophils Relative: 1 % (ref 0–1)
Eosinophils Absolute: 0.4 10*3/uL (ref 0.0–0.7)
Hemoglobin: 13.7 g/dL (ref 13.0–17.0)
Lymphocytes Relative: 32 % (ref 12–46)
Lymphs Abs: 2.9 10*3/uL (ref 0.7–4.0)
Monocytes Relative: 11 % (ref 3–12)
Neutro Abs: 4.8 10*3/uL (ref 1.7–7.7)
Neutrophils Relative %: 53 % (ref 43–77)
RBC: 4.77 MIL/uL (ref 4.22–5.81)

## 2011-09-18 ENCOUNTER — Encounter: Payer: Self-pay | Admitting: Family Medicine

## 2011-09-18 NOTE — Assessment & Plan Note (Signed)
Will order fasting lipid panel today. Will call or send letter with results to patient and to other providers. Continue Lipitor 20 mg q daily for now.

## 2011-09-18 NOTE — Assessment & Plan Note (Signed)
Will order labs requested by physician at [facility] and fax to their office.  Continue current psych regimen per psychiatry.

## 2011-09-18 NOTE — Progress Notes (Signed)
  Subjective:    Patient ID: Jose Li, male    DOB: December 10, 1955, 55 y.o.   MRN: [id number]  HPI  Patient presents to clinic for routine check up for hyperlipidemia, tobacco use.    Hyperlipidemia:  Patient has no complaints.  Denies any adverse effects of Lipitor.  Currently takes Lipitor 20 mg daily.  Patient is due for a fasting lipid panel and CMP today.  Tobacco abuse: He continues to smoke 2 packs per week, which has improved from before.  He is still not ready to quit and politely declines a referral to [facility].    Bipolar DO: Patient seen by psychiatrist at [facility].  Psychiatrist requesting a copy of lab work to be faxed to their office.   Review of Systems  Denies any recent ED visits or hospitalizations. Denies any fever, chills, night sweats, cough, chest pain.  Denies any muscle aches, numbness/tingling of extremities, or weakness.    Objective:   Physical Exam  Constitutional: He is oriented to person, place, and time. No distress.  HENT:  Head: Normocephalic and atraumatic.  Neck: Neck supple. No JVD present. No thyromegaly present.  Cardiovascular: Normal rate, regular rhythm and normal heart sounds.  Exam reveals no gallop and no friction rub.   No murmur heard. Pulmonary/Chest: Effort normal and breath sounds normal. He has no wheezes. He has no rales.  Abdominal: Soft. Bowel sounds are normal. He exhibits no distension. There is no tenderness.  Lymphadenopathy:    He has no cervical adenopathy.  Neurological: He is alert and oriented to person, place, and time.  Psychiatric: He has a normal mood and affect.           Assessment & Plan:

## 2011-11-29 ENCOUNTER — Other Ambulatory Visit: Payer: Self-pay | Admitting: Family Medicine

## 2011-11-29 NOTE — Telephone Encounter (Signed)
Refill request

## 2012-02-16 ENCOUNTER — Ambulatory Visit (INDEPENDENT_AMBULATORY_CARE_PROVIDER_SITE_OTHER): Payer: Medicaid Other | Admitting: Family Medicine

## 2012-02-16 ENCOUNTER — Encounter: Payer: Self-pay | Admitting: Family Medicine

## 2012-02-16 VITALS — BP 126/77 | HR 77 | Temp 97.8°F | Ht 72.0 in | Wt 202.6 lb

## 2012-02-16 DIAGNOSIS — E785 Hyperlipidemia, unspecified: Secondary | ICD-10-CM

## 2012-02-16 DIAGNOSIS — F17200 Nicotine dependence, unspecified, uncomplicated: Secondary | ICD-10-CM

## 2012-02-16 NOTE — Assessment & Plan Note (Signed)
Patient still not interested in smoking cessation at this time.  Does not want to cut back and definitely does not want to take Chantix.

## 2012-02-16 NOTE — Assessment & Plan Note (Signed)
Continue Lipitor 20 and start taking Fish Oil supplements. Repeat fasting lipid panel in 6 months.

## 2012-02-16 NOTE — Progress Notes (Signed)
  Subjective:    Patient ID: Jose Li, male    DOB: Apr 11, 1956, 56 y.o.   MRN: [id number]  HPI  Hyperlipidemia: Patient continues to take Lipitor 20.  He denies any myalgias.    Tobacco use: Continues to smoke 2-3 packs per week.  He is not interested in smoking cessation.  He has friends who have tried Chantix and experienced adverse effects.  He would rather smoke than go through that.   Psych/Neuro Disorder: Patient is followed by Neurology (once a year) and Psychiatry (every 6 months).  Denies any seizure activity on Keppra.  Denies any side effects on Bipolar medications.  Mood has been stable.  Review of Systems  Per HPI    Objective:   Physical Exam  Constitutional: No distress.  HENT:  Head: Normocephalic and atraumatic.  Mouth/Throat: Oropharynx is clear and moist.  Cardiovascular: Normal rate and normal heart sounds.   No murmur heard. Pulmonary/Chest: Effort normal and breath sounds normal. He has no wheezes. He has no rales.  Abdominal: Soft. He exhibits no distension. There is no tenderness.  Musculoskeletal: Normal range of motion. He exhibits no edema.  Neurological: He is alert.  Psychiatric: He has a normal mood and affect. His behavior is normal.         Assessment & Plan:

## 2012-02-16 NOTE — Patient Instructions (Signed)
It was good to see you today, Jose Li. For your cold, pick up over the counter Dayquil and Nyquil generic brands. Drink plenty of water and get plenty of sleep. If your symptoms worsen or you develop fever, vomiting, difficulty breathing, cough, please return to clinic. Also, pick up Fish oil supplements and take once a day. Schedule follow up appointment with me in 6 months.

## 2012-02-25 ENCOUNTER — Telehealth: Payer: Self-pay | Admitting: Family Medicine

## 2012-02-25 MED ORDER — ATORVASTATIN CALCIUM 20 MG PO TABS: 20.0000 mg | ORAL_TABLET | Freq: Every day | ORAL | Status: DC

## 2012-02-25 NOTE — Telephone Encounter (Signed)
Jose Li is calling because his pharmacy has sent a refill request for atorvastatin (LIPITOR), it is in Dr. Tye Savoy' box.

## 2012-02-25 NOTE — Telephone Encounter (Signed)
Will fax today

## 2012-03-01 ENCOUNTER — Other Ambulatory Visit: Payer: Self-pay | Admitting: Family Medicine

## 2012-08-02 ENCOUNTER — Ambulatory Visit (INDEPENDENT_AMBULATORY_CARE_PROVIDER_SITE_OTHER): Payer: Medicare Other | Admitting: Family Medicine

## 2012-08-02 ENCOUNTER — Encounter: Payer: Self-pay | Admitting: Family Medicine

## 2012-08-02 VITALS — BP 143/78 | HR 78 | Temp 98.0°F | Ht 72.0 in | Wt 211.0 lb

## 2012-08-02 DIAGNOSIS — Z23 Encounter for immunization: Secondary | ICD-10-CM

## 2012-08-02 DIAGNOSIS — I10 Essential (primary) hypertension: Secondary | ICD-10-CM | POA: Insufficient documentation

## 2012-08-02 HISTORY — DX: Essential (primary) hypertension: I10

## 2012-08-02 MED ORDER — HYDROCHLOROTHIAZIDE 12.5 MG PO TABS: 12.5000 mg | ORAL_TABLET | Freq: Every day | ORAL | Status: DC

## 2012-08-02 NOTE — Patient Instructions (Addendum)
Please pick up new blood pressure medication at your pharmacy.  Take once a day. Read about the DASH diet below and try to eat fresh foods, less frozen dinners. Schedule follow up appointment with me in ONE month. If you develop chest pain, nausea/vomiting, shortness of breath, one sided weakness, or numbness in extremities, please return to clinic or go to ER.  DASH Diet The DASH diet stands for "Dietary Approaches to Stop Hypertension." It is a healthy eating plan that has been shown to reduce high blood pressure (hypertension) in as little as 14 days, while also possibly providing other significant health benefits. These other health benefits include reducing the risk of breast cancer after menopause and reducing the risk of type 2 diabetes, heart disease, colon cancer, and stroke. Health benefits also include weight loss and slowing kidney failure in patients with chronic kidney disease.  DIET GUIDELINES  Limit salt (sodium). Your diet should contain less than 1500 mg of sodium daily.   Limit refined or processed carbohydrates. Your diet should include mostly whole grains. Desserts and added sugars should be used sparingly.   Include small amounts of heart-healthy fats. These types of fats include nuts, oils, and tub margarine. Limit saturated and trans fats. These fats have been shown to be harmful in the body.  CHOOSING FOODS  The following food groups are based on a 2000 calorie diet. See your Registered Dietitian for individual calorie needs. Grains and Grain Products (6 to 8 servings daily)  Eat More Often: Whole-wheat bread, brown rice, whole-grain or wheat pasta, quinoa, popcorn without added fat or salt (air popped).   Eat Less Often: White bread, white pasta, white rice, cornbread.  Vegetables (4 to 5 servings daily)  Eat More Often: Fresh, frozen, and canned vegetables. Vegetables may be raw, steamed, roasted, or grilled with a minimal amount of fat.   Eat Less Often/Avoid:  Creamed or fried vegetables. Vegetables in a cheese sauce.  Fruit (4 to 5 servings daily)  Eat More Often: All fresh, canned (in natural juice), or frozen fruits. Dried fruits without added sugar. One hundred percent fruit juice ( cup [237 mL] daily).   Eat Less Often: Dried fruits with added sugar. Canned fruit in light or heavy syrup.  [employer], Fish, and Poultry (2 servings or less daily. One serving is 3 to 4 oz [85-114 g]).  Eat More Often: Ninety percent or leaner ground beef, tenderloin, sirloin. Round cuts of beef, chicken breast, [place] breast. All fish. Grill, bake, or broil your meat. Nothing should be fried.   Eat Less Often/Avoid: Fatty cuts of meat, [place], or chicken leg, thigh, or wing. Fried cuts of meat or fish.  Dairy (2 to 3 servings)  Eat More Often: Low-fat or fat-free milk, low-fat plain or light yogurt, reduced-fat or part-skim cheese.   Eat Less Often/Avoid: Milk (whole, 2%, skim, or chocolate).Whole milk yogurt. Full-fat cheeses.  Nuts, Seeds, and Legumes (4 to 5 servings per week)  Eat More Often: All without added salt.   Eat Less Often/Avoid: Salted nuts and seeds, canned beans with added salt.  Fats and Sweets (limited)  Eat More Often: Vegetable oils, tub margarines without trans fats, sugar-free gelatin. Mayonnaise and salad dressings.   Eat Less Often/Avoid: Coconut oils, palm oils, butter, stick margarine, cream, half and half, cookies, candy, pie.  FOR MORE INFORMATION The Dash Diet Eating Plan: www.dashdiet.org Document Released: 10/21/2011 Document Reviewed: 10/11/2011 [facility] Patient Information 2012 [place], [place].

## 2012-08-02 NOTE — Progress Notes (Signed)
  Subjective:    Patient here for follow-up of elevated blood pressure.  He is exercising (walks for 30 minutes in the mornings 5x/week) and is not adherent to a low-salt diet.  He likes to frozen dinners, pizza, and goes out to eat 2 times per week.  Patient denies: dyspnea, fatigue, palpitations and syncope.  Does complain of intermittent chest pain - not associated with food or exertion.  CP not associated with nausea/vomiting, diaphoreses.  Lasts for a few minutes, and resolves on its own.  Use of agents associated with hypertension: takes Propanolol for migraine prophylaxis.  He went to his Psych office last week, BP was elevated there and advised to see PCP.  He is followed by Psych NP every 6 months.  Review of Systems Pertinent items are noted in HPI.     Objective:    BP 143/78  Pulse 78  Temp 98 F (36.7 C) (Oral)  Ht 6' (1.829 m)  Wt 211 lb (95.709 kg)  BMI 28.62 kg/m2  General Appearance:    Alert, cooperative, no distress, central obesity  Head:    Normocephalic, without obvious abnormality, atraumatic  Eyes:    PERRL, conjunctiva/corneas clear, EOM's intact  Neck:   Supple, symmetrical, trachea midline, no adenopathy;       thyroid:  No enlargement/tenderness/nodules; no carotid   bruit or JVD  Lungs:     Clear to auscultation bilaterally, respirations unlabored  Heart:    Regular rate and rhythm, S1 and S2 normal, no murmur, rub   or gallop  Extremities:   1+ pitting edema bilateral ankles to shin; no skin breakdown  Neurologic:   No focal deficits      Assessment:    Hypertension.   Plan:    See Problem List.

## 2012-08-02 NOTE — Assessment & Plan Note (Signed)
BP elevated at Psych office last week and elevated today. He is taking Inderal for migraine PPx, but BP still elevated likely due to sedentary lifestyle and poor dietary habits. Start HCTZ 12.5 mg today and recheck BP in one month. Red flags reviewed per [facility].

## 2012-08-29 ENCOUNTER — Ambulatory Visit: Payer: Medicare Other | Admitting: Family Medicine

## 2012-09-01 ENCOUNTER — Ambulatory Visit (INDEPENDENT_AMBULATORY_CARE_PROVIDER_SITE_OTHER): Payer: Medicare Other | Admitting: Family Medicine

## 2012-09-01 ENCOUNTER — Encounter: Payer: Self-pay | Admitting: Family Medicine

## 2012-09-01 VITALS — BP 114/72 | HR 60 | Temp 98.6°F | Ht 72.0 in | Wt 209.0 lb

## 2012-09-01 DIAGNOSIS — M7989 Other specified soft tissue disorders: Secondary | ICD-10-CM

## 2012-09-01 DIAGNOSIS — I10 Essential (primary) hypertension: Secondary | ICD-10-CM

## 2012-09-01 MED ORDER — HYDROCHLOROTHIAZIDE 12.5 MG PO TABS: 12.5000 mg | ORAL_TABLET | Freq: Every day | ORAL | Status: DC

## 2012-09-01 NOTE — Assessment & Plan Note (Signed)
Now well controlled.  Cont current meds with no changes.

## 2012-09-01 NOTE — Assessment & Plan Note (Signed)
With asymmetric leg swelling will order venous doplers.  As this is a long-term problem do not anticipate this will be an acute DVT but it would be helpful to know if there is a chronic DVT present.  If doplers do show acute DVT would plan Xarelto (has insurance so should be able to obtain).

## 2012-09-01 NOTE — Patient Instructions (Signed)
It was good to see you today! We will schedule an ultrasound of your legs to make certain you do not have a blood clot. I will call you if there is a blood clot, otherwise I will send you a letter. Plan on getting back to see [place] in 2-3 months.

## 2012-09-01 NOTE — Progress Notes (Signed)
Patient ID: Jose Li, male   DOB: 03-16-1956, 56 y.o.   MRN: [id number] Subjective: The patient is a 56 y.o. year old male who presents today for htn f/u.  HTN: No cp, new SOB.  Tolerating HCTZ well.  BP appropriate today.  Continues to have asymmetric leg swelling.  Reports this has been a problem for some time with no new changes.  Mood: Reports mood is slightly more depressed than normal but denies any SI/HI.  Has appt with mental health at end of month.  They typically manage his meds.  Patient's past medical, social, and family history were reviewed and updated as appropriate. History  Substance Use Topics  . Smoking status: Current Every Day Smoker -- 0.3 packs/day for 30 years    Types: Cigarettes  . Smokeless tobacco: Never Used  . Alcohol Use: No   Objective:  Filed Vitals:   09/01/12 0956  BP: 114/72  Pulse: 60  Temp: 98.6 F (37 C)   Gen: NAD CV: RRR Resp: CTABL Ext: Edema and dilated veins on right.  Slightly dilated veins on left but no edema.  Some tenderness in right calf.  Assessment/Plan:  Please also see individual problems in problem list for problem-specific plans.

## 2012-09-04 ENCOUNTER — Telehealth: Payer: Self-pay | Admitting: Family Medicine

## 2012-09-04 ENCOUNTER — Ambulatory Visit (HOSPITAL_COMMUNITY)
Admission: RE | Admit: 2012-09-04 | Discharge: 2012-09-04 | Disposition: A | Discharge status: Home / Self Care | Payer: Medicare Other | Source: Ambulatory Visit | Attending: Family Medicine | Admitting: Family Medicine

## 2012-09-04 DIAGNOSIS — M7989 Other specified soft tissue disorders: Secondary | ICD-10-CM

## 2012-09-04 DIAGNOSIS — M79609 Pain in unspecified limb: Secondary | ICD-10-CM | POA: Insufficient documentation

## 2012-09-04 NOTE — Progress Notes (Signed)
VASCULAR LAB PRELIMINARY  PRELIMINARY  PRELIMINARY  PRELIMINARY  Bilateral lower extremity venous Dopplers completed.    Preliminary report:  There is no DVT or SVT noted in the bilateral lower extremities.  Carel Carrier, 09/04/2012, 9:49 AM

## 2012-09-04 NOTE — Telephone Encounter (Signed)
I received phone call in preceptor room from vascular lab, calling to report negative for clots, but does have varicosities.  Patient discharged from vascular lab.  Paula Compton, MD

## 2012-09-13 ENCOUNTER — Ambulatory Visit: Payer: Medicare Other | Admitting: Family Medicine

## 2012-09-22 ENCOUNTER — Other Ambulatory Visit: Payer: Self-pay | Admitting: Family Medicine

## 2012-09-28 ENCOUNTER — Other Ambulatory Visit: Payer: Self-pay | Admitting: Family Medicine

## 2012-11-09 ENCOUNTER — Other Ambulatory Visit: Payer: Self-pay | Admitting: Family Medicine

## 2012-11-10 ENCOUNTER — Ambulatory Visit (INDEPENDENT_AMBULATORY_CARE_PROVIDER_SITE_OTHER): Payer: Medicare Other | Admitting: Family Medicine

## 2012-11-10 ENCOUNTER — Encounter: Payer: Self-pay | Admitting: Family Medicine

## 2012-11-10 VITALS — BP 117/76 | HR 90 | Temp 98.6°F | Ht 72.0 in | Wt 209.0 lb

## 2012-11-10 DIAGNOSIS — Z0000 Encounter for general adult medical examination without abnormal findings: Secondary | ICD-10-CM

## 2012-11-10 DIAGNOSIS — I10 Essential (primary) hypertension: Secondary | ICD-10-CM

## 2012-11-10 DIAGNOSIS — E669 Obesity, unspecified: Secondary | ICD-10-CM

## 2012-11-10 DIAGNOSIS — F17200 Nicotine dependence, unspecified, uncomplicated: Secondary | ICD-10-CM

## 2012-11-10 DIAGNOSIS — E785 Hyperlipidemia, unspecified: Secondary | ICD-10-CM

## 2012-11-10 DIAGNOSIS — F319 Bipolar disorder, unspecified: Secondary | ICD-10-CM

## 2012-11-10 LAB — COMPREHENSIVE METABOLIC PANEL
AST: 15 U/L (ref 0–37)
Alkaline Phosphatase: 54 U/L (ref 39–117)
BUN: 3 mg/dL — ABNORMAL LOW (ref 6–23)
Calcium: 9.3 mg/dL (ref 8.4–10.5)
Chloride: 100 mEq/L (ref 96–112)
Creat: 1 mg/dL (ref 0.50–1.35)
Total Bilirubin: 0.4 mg/dL (ref 0.3–1.2)

## 2012-11-10 LAB — CBC
HCT: 42 % (ref 39.0–52.0)
MCH: 28.9 pg (ref 26.0–34.0)
MCHC: 34.5 g/dL (ref 30.0–36.0)
MCV: 83.7 fL (ref 78.0–100.0)
RDW: 13.7 % (ref 11.5–15.5)

## 2012-11-10 LAB — LIPID PANEL
HDL: 32 mg/dL — ABNORMAL LOW (ref >39)
Total CHOL/HDL Ratio: 5.5 Ratio
VLDL: 73 mg/dL — ABNORMAL HIGH (ref 0–40)

## 2012-11-10 NOTE — Assessment & Plan Note (Signed)
Discussed smoking cessation, he smokes about 6 cigarettes per day but is not interested in cutting back at this time.  Usually not motivated when we discuss this.

## 2012-11-10 NOTE — Progress Notes (Signed)
  Subjective:    Patient ID: SAGE KOPERA, male    DOB: 07/28/1956, 56 y.o.   MRN: [id number]  HPI  Patient presents to clinic for follow up blood pressure, LE swelling on RT, and for annual screening lab work (typically faxed to [facility]).  Patient has no complaints or concerns today.  BP at goal today.  He denies any HA, lightheadedness, paresthesias, abdominal or chest pain.  He continues to take Propanolol 60 for migraine PPx and HCTZ for HTN.  He says HCTZ has helped to decrease swelling oft RT LE.  He was evaluated by Dr. Louanne Belton previously - LE dopplers were negative for DVT.  Patient says his RT LE has been larger than LT since he had pneumonia several years ago.  Patient continues to smoke and is not motivated to quit.  He smokes about 6 cigarettes per day.  He does not seem interested in meeting with [facility] or starting Chantix.  Review of Systems  Per HPI    Objective:   Physical Exam  Constitutional: He appears well-nourished. No distress.  HENT:  Mouth/Throat: Oropharynx is clear and moist.  Cardiovascular: Normal rate, regular rhythm and normal heart sounds.   Pulmonary/Chest: Effort normal and breath sounds normal. He has no wheezes. He has no rales.  Abdominal:       Central obesity, soft, ND, NT  Musculoskeletal:       Arms:      Assessment & Plan:

## 2012-11-10 NOTE — Patient Instructions (Addendum)
It was good to see you again, Jose Li. Your blood pressure was normal today.  Keep up the good work. Try to increase physical activity - try to exercise 30 minutes per day. You will receive a letter with results with recent lab work in the next week or so. Have a happy New Year!!

## 2012-11-10 NOTE — Assessment & Plan Note (Signed)
Will check lipid panel today 

## 2012-11-10 NOTE — Assessment & Plan Note (Signed)
BP at goal today.   Will order CBC and CMET today - will need to be faxed to [facility], as well as TSH. Continue Propanolol (mostly for migraine PPx) and HCTZ (he says this has improved RT lower extremity swelling). Recheck BP in 1-2 months - if still well controlled, will consider backing off on either Propanolol or HCTZ. Red flags reviewed - recommended elevating LE at night.

## 2012-12-01 ENCOUNTER — Telehealth: Payer: Self-pay | Admitting: Family Medicine

## 2012-12-01 NOTE — Telephone Encounter (Signed)
Pt is calling about his lab results - has not heard anything and also the doctor was supposed to fax results to [facility].  pls advise

## 2012-12-04 ENCOUNTER — Encounter: Payer: Self-pay | Admitting: Family Medicine

## 2012-12-04 NOTE — Telephone Encounter (Signed)
Hi team, I have a huge favor to ask.  Can you please print out two copies of the results letter and send one copy to patient and the other to [facility]?  I don't know what the fax number is and when I called the office, the number was disconnected.  I'll try to search again tomorrow.  Thanks.

## 2012-12-04 NOTE — Telephone Encounter (Signed)
Dr. Sherron Flemings Sondra Come

## 2012-12-05 NOTE — Telephone Encounter (Signed)
Mailed to patient, faxed to [facility] [phone]

## 2012-12-11 ENCOUNTER — Other Ambulatory Visit: Payer: Self-pay | Admitting: Neurology

## 2012-12-11 DIAGNOSIS — R42 Dizziness and giddiness: Secondary | ICD-10-CM

## 2012-12-14 ENCOUNTER — Ambulatory Visit
Admission: RE | Admit: 2012-12-14 | Discharge: 2012-12-14 | Disposition: A | Discharge status: Home / Self Care | Payer: Medicare Other | Source: Ambulatory Visit | Attending: Neurology | Admitting: Neurology

## 2012-12-14 DIAGNOSIS — R42 Dizziness and giddiness: Secondary | ICD-10-CM

## 2012-12-14 MED ORDER — GADOBENATE DIMEGLUMINE 529 MG/ML IV SOLN
20.0000 mL | Freq: Once | INTRAVENOUS | Status: AC | PRN
  Administered 2012-12-14: 20 mL via INTRAVENOUS

## 2012-12-25 ENCOUNTER — Encounter: Payer: Self-pay | Admitting: Internal Medicine

## 2012-12-25 ENCOUNTER — Ambulatory Visit (INDEPENDENT_AMBULATORY_CARE_PROVIDER_SITE_OTHER): Payer: Medicare Other | Admitting: Internal Medicine

## 2012-12-25 VITALS — BP 90/70 | HR 86 | Ht 72.0 in | Wt 211.4 lb

## 2012-12-25 DIAGNOSIS — R55 Syncope and collapse: Secondary | ICD-10-CM

## 2012-12-25 NOTE — Patient Instructions (Addendum)
Do not take Hydrochlorothiazide and Propranolol.  Your physician recommends that you schedule a follow-up appointment in: 4-6 weeks with Dr. Tenny Craw.

## 2012-12-25 NOTE — Progress Notes (Signed)
HPI Patient referred for evaluation of syncope Patient blacks out with no warning  Other times gets real dizzy, hot  Ears ring more.  Pass out then No history of injury.  OUt for a min  After feels weak after   Not dizzy all the time.  Drinks fluids.    Allergies  Allergen Reactions  . Erythromycin     Current Outpatient Prescriptions  Medication Sig Dispense Refill  . aspirin 81 MG EC tablet Take 81 mg by mouth daily.        Marland Kitchen atorvastatin (LIPITOR) 20 MG tablet TAKE ONE TABLET BY MOUTH EVERY DAY  30 tablet  4  . benztropine (COGENTIN) 1 MG tablet Take 1 mg by mouth 2 (two) times daily.        . cetirizine (ZYRTEC) 10 MG tablet TAKE ONE TABLET BY MOUTH EVERY DAY  30 tablet  10  . DULoxetine (CYMBALTA) 60 MG capsule Take 60 mg by mouth daily.        . hydrochlorothiazide (HYDRODIURIL) 12.5 MG tablet Take 1 tablet (12.5 mg total) by mouth daily.  30 tablet  3  . ibuprofen (ADVIL,MOTRIN) 600 MG tablet TAKE ONE TABLET BY MOUTH EVERY 6 HOURS AS NEEDED FOR HEADACHE  30 tablet  3  . levETIRAcetam (KEPPRA) 500 MG tablet Take 1,500 mg by mouth 2 (two) times daily.       . Multiple Vitamins-Minerals (MENS MULTIVITAMIN PLUS) TABS Take 1 tablet by mouth daily.        . pregabalin (LYRICA) 50 MG capsule Take 50 mg by mouth 3 (three) times daily. Take 1 in the morning and 2 in the evening       . propranolol ER (INDERAL LA) 60 MG 24 hr capsule TAKE ONE CAPSULE BY MOUTH EVERY DAY  30 capsule  5  . risperiDONE (RISPERDAL) 1 MG tablet Take 1 mg by mouth every morning.        . risperiDONE (RISPERDAL) 2 MG tablet Take 2 mg by mouth at bedtime.        . thiamine 100 MG tablet Take 100 mg by mouth daily.        . traZODone (DESYREL) 100 MG tablet 100 mg at bedtime.        Current Facility-Administered Medications  Medication Dose Route Frequency Provider Last Rate Last Dose  . 0.9 %  sodium chloride infusion  500 mL Intravenous Continuous Hilarie Fredrickson, MD        Past Medical History  Diagnosis Date   . Allergy   . Anxiety   . Depression   . Hyperlipidemia   . Substance abuse   . Seizures     none for last 6-59months  . Hypertension, essential, benign 08/02/2012    Past Surgical History  Procedure Laterality Date  . Fracture surgery      right hip and femur  . Joint replacement      right hip  . Tracheostomy  08/1999    Had pneumoniaand was in a coma 10 days  . Hernia repair  07/2009    umbilical hernia/ got infected    Family History  Problem Relation Age of Onset  . Heart disease Mother   . Diabetes Father   . Heart disease Brother     History   Social History  . Marital Status: Single    Spouse Name: N/A    Number of Children: N/A  . Years of Education: N/A   Occupational History  . Not  on file.   Social History Main Topics  . Smoking status: Current Every Day Smoker -- 0.30 packs/day for 30 years    Types: Cigarettes  . Smokeless tobacco: Never Used  . Alcohol Use: No  . Drug Use: No  . Sexually Active: Not Currently   Other Topics Concern  . Not on file   Social History Narrative  . No narrative on file    Review of Systems:  All systems reviewed.  They are negative to the above problem except as previously stated.  Vital Signs: BP 114/69  Pulse 76  Ht 6' (1.829 m)  Wt 211 lb 6.4 oz (95.89 kg)  BMI 28.66 kg/m2  Physical Exam Patient is in NAD HEENT:  Normocephalic, atraumatic. EOMI, PERRLA.  Neck: JVP is normal.  No bruits.  Lungs: clear to auscultation. No rales no wheezes.  Heart: Regular rate and rhythm. Normal S1, S2. No S3.   No significant murmurs. PMI not displaced.  Abdomen:  Supple, nontender. Normal bowel sounds. No masses. No hepatomegaly.  Extremities:   Good distal pulses throughout. No lower extremity edema.  Musculoskeletal :moving all extremities.  Neuro:   alert and oriented x3.  CN II-XII grossly intact.  EKG  SR 79  Occasional PVC Assessment and Plan:  1.  Syncope.    I do not think spells are due to arrhythmia.   Question orthostatic.  I would recomm holking HCTZ and propranolol.  Stay hydrated.     2.  HL  Continue statin.

## 2013-01-30 ENCOUNTER — Encounter: Payer: Self-pay | Admitting: Family Medicine

## 2013-01-30 ENCOUNTER — Ambulatory Visit (INDEPENDENT_AMBULATORY_CARE_PROVIDER_SITE_OTHER): Payer: Medicare Other | Admitting: Family Medicine

## 2013-01-30 VITALS — BP 161/83 | HR 99 | Temp 97.9°F | Ht 72.0 in | Wt 212.0 lb

## 2013-01-30 MED ORDER — LOSARTAN POTASSIUM 50 MG PO TABS: 50.0000 mg | ORAL_TABLET | Freq: Every day | ORAL | Status: DC

## 2013-01-30 NOTE — Assessment & Plan Note (Signed)
Poorly controlled off medication. He will prefer not to restart HCTZ and Propranolol since he believe it was the cause of his dizziness. I started him on Losartan 50 mg qd,patient instructed to check his BP atleast 2-3 times per week and to alert me or his PMD if BP is running high. He verbalized understanding.

## 2013-01-30 NOTE — Patient Instructions (Addendum)
PLEASE CHECK BP FREQUENTLY FOR 1 WEEK IF STILL ELEVATED CALL us.  Losartan tablets What is this medicine? LOSARTAN (loe SAR tan) is used to treat high blood pressure and to reduce the risk of stroke in certain patients. This drug also slows the progression of kidney disease in patients with diabetes. This medicine may be used for other purposes; ask your health care provider or pharmacist if you have questions. What should I tell my health care provider before I take this medicine? They need to know if you have any of these conditions: -heart failure -kidney or liver disease -an unusual or allergic reaction to losartan, other medicines, foods, dyes, or preservatives -pregnant or trying to get pregnant -breast-feeding How should I use this medicine? Take this medicine by mouth with a glass of water. Follow the directions on the prescription label. This medicine can be taken with or without food. Take your doses at regular intervals. Do not take your medicine more often than directed. Talk to your pediatrician regarding the use of this medicine in children. Special care may be needed. Overdosage: If you think you have taken too much of this medicine contact a poison control center or emergency room at once. NOTE: This medicine is only for you. Do not share this medicine with others. What if I miss a dose? If you miss a dose, take it as soon as you can. If it is almost time for your next dose, take only that dose. Do not take double or extra doses. What may interact with this medicine? -blood pressure medicines -diuretics, especially triamterene, spironolactone, or amiloride -fluconazole -NSAIDs, medicines for pain and inflammation, like ibuprofen or naproxen -potassium salts or potassium supplements -rifampin This list may not describe all possible interactions. Give your health care provider a list of all the medicines, herbs, non-prescription drugs, or dietary supplements you use. Also  tell them if you smoke, drink alcohol, or use illegal drugs. Some items may interact with your medicine. What should I watch for while using this medicine? Visit your doctor or health care professional for regular checks on your progress. Check your blood pressure as directed. Ask your doctor or health care professional what your blood pressure should be and when you should contact him or her. Call your doctor or health care professional if you notice an irregular or fast heart beat. Women should inform their doctor if they wish to become pregnant or think they might be pregnant. There is a potential for serious side effects to an unborn child, particularly in the second or third trimester. Talk to your health care professional or pharmacist for more information. You may get drowsy or dizzy. Do not drive, use machinery, or do anything that needs mental alertness until you know how this drug affects you. Do not stand or sit up quickly, especially if you are an older patient. This reduces the risk of dizzy or fainting spells. Alcohol can make you more drowsy and dizzy. Avoid alcoholic drinks. Avoid salt substitutes unless you are told otherwise by your doctor or health care professional. Do not treat yourself for coughs, colds, or pain while you are taking this medicine without asking your doctor or health care professional for advice. Some ingredients may increase your blood pressure. What side effects may I notice from receiving this medicine? Side effects that you should report to your doctor or health care professional as soon as possible: -confusion, dizziness, light headedness or fainting spells -decreased amount of urine passed -difficulty breathing or  swallowing, hoarseness, or tightening of the throat -fast or irregular heart beat, palpitations, or chest pain -skin rash, itching -swelling of your face, lips, tongue, hands, or feet Side effects that usually do not require medical attention  (report to your doctor or health care professional if they continue or are bothersome): -cough -decreased sexual function or desire -headache -nasal congestion or stuffiness -nausea or stomach pain -sore or cramping muscles This list may not describe all possible side effects. Call your doctor for medical advice about side effects. You may report side effects to FDA at 1-800-FDA-1088. Where should I keep my medicine? Keep out of the reach of children. Store at room temperature between 15 and 30 degrees C (59 and 86 degrees F). Protect from light. Keep container tightly closed. Throw away any unused medicine after the expiration date. NOTE: This sheet is a summary. It may not cover all possible information. If you have questions about this medicine, talk to your doctor, pharmacist, or health care provider.  2012, [employer]. (01/12/2008 4:42:18 PM)

## 2013-01-30 NOTE — Progress Notes (Signed)
Subjective:     Patient ID: Jose Li, male   DOB: 13-Jul-1956, 57 y.o.   MRN: [id number]  HPI HTN: Patient is here to follow up for his BP which has been running high since he was taken off his HCTZ and Propranolol due to him feeling dizzy.BP [facility] and his BP was 182/102. He denies chest pain,no dizziness,he does have ringing in his ears due to her BP and body feels strange.He does not want to get back on HCTZ and propranolol. Hyperlipidemia;He is compliant with Lipitor 20 mg qd which he has been on for many years,he denies any S/E to this medication.   Review of Systems  Constitutional: Negative for fever and fatigue.  Eyes: Negative for visual disturbance.  Respiratory: Negative.   Cardiovascular: Negative.   Gastrointestinal: Negative.   Genitourinary: Negative.   Neurological: Negative for dizziness.  All other systems reviewed and are negative.   Filed Vitals:   01/30/13 1046  BP: 161/83  Pulse: 99  Temp: 97.9 F (36.6 C)  TempSrc: Oral  Height: 6' (1.829 m)  Weight: 212 lb (96.163 kg)      Objective:   Physical Exam  Nursing note and vitals reviewed. Constitutional: He is oriented to person, place, and time. He appears well-developed. No distress.  Cardiovascular: Normal rate, regular rhythm, normal heart sounds and intact distal pulses.   No murmur heard. Pulmonary/Chest: Effort normal and breath sounds normal. No respiratory distress. He has no wheezes. He exhibits no tenderness.  Abdominal: Soft. Bowel sounds are normal. He exhibits no mass. There is no tenderness.  Musculoskeletal: He exhibits no edema.  Neurological: He is alert and oriented to person, place, and time. No cranial nerve deficit.       Assessment/Plan:

## 2013-01-30 NOTE — Assessment & Plan Note (Signed)
He his compliant with his medication. I reviewed his lab,he has normal Chol,but triglyceride is high. I suggested he will need his Lipitor increased or start Niacin. Since I just started him on new medication for his BP,I suggested he follow up with his PMD to discuss medication adjustment. In the interim,he will continue to work on diet control as well. He agreed with plan.

## 2013-02-05 ENCOUNTER — Encounter: Payer: Self-pay | Admitting: Internal Medicine

## 2013-02-05 ENCOUNTER — Ambulatory Visit (INDEPENDENT_AMBULATORY_CARE_PROVIDER_SITE_OTHER): Payer: Medicare Other | Admitting: Internal Medicine

## 2013-02-05 VITALS — BP 119/61 | HR 104 | Ht 72.0 in | Wt 212.0 lb

## 2013-02-05 DIAGNOSIS — I10 Essential (primary) hypertension: Secondary | ICD-10-CM

## 2013-02-05 DIAGNOSIS — I951 Orthostatic hypotension: Secondary | ICD-10-CM

## 2013-02-05 DIAGNOSIS — R55 Syncope and collapse: Secondary | ICD-10-CM

## 2013-02-05 MED ORDER — LOSARTAN POTASSIUM 25 MG PO TABS: 25.0000 mg | ORAL_TABLET | Freq: Every day | ORAL | Status: DC

## 2013-02-05 MED ORDER — FLUDROCORTISONE ACETATE 0.1 MG PO TABS: 0.0500 mg | ORAL_TABLET | Freq: Two times a day (BID) | ORAL | Status: DC

## 2013-02-05 MED ORDER — PINDOLOL 5 MG PO TABS: 2.5000 mg | ORAL_TABLET | Freq: Two times a day (BID) | ORAL | Status: DC

## 2013-02-05 NOTE — Progress Notes (Signed)
HPI Patient is a 57 yo who I saw back in Feb  He has a history of syncope.  Whien I saw him he was orthostatic. I recomm he stop HTCT and propranlol.  INcrease fluid intake Since seen the patient has not had any more syncopal spells  He still gets dizzy. He was seen last week in [facility].  Placed on cozaar 50 since his BP was 180s/ Allergies  Allergen Reactions  . Erythromycin   . Hctz (Hydrochlorothiazide)     "Makes him faint" per patient Dr. Tenny Craw ([place]) took him off  . Propranolol     "Makes him faint" per patient Dr. Tenny Craw ([place]) took him off      Current Outpatient Prescriptions  Medication Sig Dispense Refill  . aspirin 81 MG EC tablet Take 81 mg by mouth daily.        Marland Kitchen atorvastatin (LIPITOR) 20 MG tablet TAKE ONE TABLET BY MOUTH EVERY DAY  30 tablet  4  . benztropine (COGENTIN) 1 MG tablet Take 1 mg by mouth 2 (two) times daily.        . cetirizine (ZYRTEC) 10 MG tablet TAKE ONE TABLET BY MOUTH EVERY DAY  30 tablet  10  . DULoxetine (CYMBALTA) 60 MG capsule Take 60 mg by mouth daily.        Marland Kitchen ibuprofen (ADVIL,MOTRIN) 600 MG tablet TAKE ONE TABLET BY MOUTH EVERY 6 HOURS AS NEEDED FOR HEADACHE  30 tablet  3  . levETIRAcetam (KEPPRA) 500 MG tablet Take 1,500 mg by mouth 2 (two) times daily.       Marland Kitchen losartan (COZAAR) 50 MG tablet Take 1 tablet (50 mg total) by mouth daily.  30 tablet  1  . Multiple Vitamins-Minerals (MENS MULTIVITAMIN PLUS) TABS Take 1 tablet by mouth daily.        . pregabalin (LYRICA) 50 MG capsule Take 50 mg by mouth 3 (three) times daily. Take 1 in the morning and 2 in the evening       . risperiDONE (RISPERDAL) 1 MG tablet Take 1 mg by mouth every morning.        . risperiDONE (RISPERDAL) 2 MG tablet Take 2 mg by mouth at bedtime.        . thiamine 100 MG tablet Take 100 mg by mouth daily.        . traZODone (DESYREL) 100 MG tablet 100 mg at bedtime.        Current Facility-Administered Medications  Medication Dose Route Frequency Provider Last  Rate Last Dose  . 0.9 %  sodium chloride infusion  500 mL Intravenous Continuous Hilarie Fredrickson, MD        Past Medical History  Diagnosis Date  . Allergy   . Anxiety   . Depression   . Hyperlipidemia   . Substance abuse   . Seizures     none for last 6-74months  . Hypertension, essential, benign 08/02/2012    Past Surgical History  Procedure Laterality Date  . Fracture surgery      right hip and femur  . Joint replacement      right hip  . Tracheostomy  08/1999    Had pneumoniaand was in a coma 10 days  . Hernia repair  07/2009    umbilical hernia/ got infected    Family History  Problem Relation Age of Onset  . Heart disease Mother   . Diabetes Father   . Heart disease Brother     History  Social History  . Marital Status: Single    Spouse Name: N/A    Number of Children: N/A  . Years of Education: N/A   Occupational History  . Not on file.   Social History Main Topics  . Smoking status: Current Every Day Smoker -- 0.30 packs/day for 30 years    Types: Cigarettes  . Smokeless tobacco: Never Used  . Alcohol Use: No  . Drug Use: No  . Sexually Active: Not Currently   Other Topics Concern  . Not on file   Social History Narrative  . No narrative on file    Review of Systems:  All systems reviewed.  They are negative to the above problem except as previously stated.  Vital Signs: BP 119/61  Pulse 104  Ht 6' (1.829 m)  Wt 212 lb (96.163 kg)  BMI 28.75 kg/m2  Physical Exam Patient is in NAD HEENT:  Normocephalic, atraumatic. EOMI, PERRLA.  Neck: JVP is normal.  No bruits.  Lungs: clear to auscultation. No rales no wheezes.  Heart: Regular rate and rhythm. Normal S1, S2. No S3.   No significant murmurs. PMI not displaced.  Abdomen:  Supple, nontender. Normal bowel sounds. No masses. No hepatomegaly.  Extremities:   Good distal pulses throughout. No lower extremity edema.  Musculoskeletal :moving all extremities.  Neuro:   alert and oriented x3.   CN II-XII grossly intact.   Assessment and Plan: 1.  Syncope.  He still is orthostatic.  I would recomm adding Flornef 0.05  At 8 and 2.  Also add pindolol 2.5 bid Cut cozaar to 25.  F/U in 1 month. Will need to have BMET checked in 10 days.

## 2013-02-05 NOTE — Patient Instructions (Addendum)
Decrease Cozaar to 25mg  daily.  Start Pindolol 2.5mg  twice daily.  Start Florinef 0.05mg  at 8AM and 2PM daily.  Your physician recommends that you schedule a follow-up appointment with Dr. Tenny Craw on 02/23/2013.

## 2013-02-23 ENCOUNTER — Other Ambulatory Visit (INDEPENDENT_AMBULATORY_CARE_PROVIDER_SITE_OTHER): Payer: Medicare Other

## 2013-02-23 ENCOUNTER — Ambulatory Visit (INDEPENDENT_AMBULATORY_CARE_PROVIDER_SITE_OTHER): Payer: Medicare Other | Admitting: Internal Medicine

## 2013-02-23 VITALS — BP 126/78 | HR 84 | Ht 72.0 in | Wt 219.0 lb

## 2013-02-23 DIAGNOSIS — I10 Essential (primary) hypertension: Secondary | ICD-10-CM

## 2013-02-23 LAB — BASIC METABOLIC PANEL
BUN: 3 mg/dL — ABNORMAL LOW (ref 6–23)
Creatinine, Ser: 0.9 mg/dL (ref 0.4–1.5)
GFR: 88.51 mL/min (ref >60.00)
Glucose, Bld: 86 mg/dL (ref 70–99)
Potassium: 2.6 mEq/L — CL (ref 3.5–5.1)

## 2013-02-23 MED ORDER — POTASSIUM CHLORIDE CRYS ER 20 MEQ PO TBCR: 20.0000 meq | EXTENDED_RELEASE_TABLET | ORAL | Status: DC

## 2013-02-23 NOTE — Progress Notes (Signed)
HPI Patient is a 57 yo who I saw back in Feb He has a history of syncope. Whien I saw him he was orthostatic.  I recomm he stop HTCT and propranlol. INcrease fluid intake  I saw him in March  Primary care had placed him on Cozaar 50  He was still dizzy  He was still orthostatic I cut back on his cozaar to 25 and added Florinef 0.05 bid and Pindolol 2.5 bid.    Since seen he denies syncope  He says he is much less dizzy. Breathing is OK  APpetite OK  Allergies     Allergies  Allergen Reactions  . Erythromycin   . Hctz (Hydrochlorothiazide)     "Makes him faint" per patient Dr. Tenny Craw ([place]) took him off  . Propranolol     "Makes him faint" per patient Dr. Tenny Craw ([place]) took him off      Current Outpatient Prescriptions  Medication Sig Dispense Refill  . atorvastatin (LIPITOR) 20 MG tablet TAKE ONE TABLET BY MOUTH EVERY DAY  30 tablet  4  . benztropine (COGENTIN) 1 MG tablet Take 1 mg by mouth 2 (two) times daily.        . cetirizine (ZYRTEC) 10 MG tablet TAKE ONE TABLET BY MOUTH EVERY DAY  30 tablet  10  . DULoxetine (CYMBALTA) 60 MG capsule Take 60 mg by mouth daily.        . fludrocortisone (FLORINEF) 0.1 MG tablet Take 0.5 tablets (0.05 mg total) by mouth 2 (two) times daily. Twice daily at 8AM and 2PM.  60 tablet  3  . ibuprofen (ADVIL,MOTRIN) 600 MG tablet TAKE ONE TABLET BY MOUTH EVERY 6 HOURS AS NEEDED FOR HEADACHE  30 tablet  3  . levETIRAcetam (KEPPRA) 500 MG tablet Take 1,500 mg by mouth 2 (two) times daily.       Marland Kitchen losartan (COZAAR) 25 MG tablet Take 1 tablet (25 mg total) by mouth daily.  90 tablet  3  . Multiple Vitamins-Minerals (MENS MULTIVITAMIN PLUS) TABS Take 1 tablet by mouth daily.        . pindolol (VISKEN) 5 MG tablet Take 0.5 tablets (2.5 mg total) by mouth 2 (two) times daily.  60 tablet  3  . pregabalin (LYRICA) 50 MG capsule Take 50 mg by mouth 3 (three) times daily. Take 1 in the morning and 2 in the evening       . risperiDONE (RISPERDAL) 1 MG tablet  Take 1 mg by mouth every morning.        . risperiDONE (RISPERDAL) 2 MG tablet Take 2 mg by mouth at bedtime.        . thiamine 100 MG tablet Take 100 mg by mouth daily.        . traZODone (DESYREL) 100 MG tablet 150 mg at bedtime.       Marland Kitchen aspirin 81 MG EC tablet Take 81 mg by mouth daily.         Current Facility-Administered Medications  Medication Dose Route Frequency Provider Last Rate Last Dose  . 0.9 %  sodium chloride infusion  500 mL Intravenous Continuous Hilarie Fredrickson, MD        Past Medical History  Diagnosis Date  . Allergy   . Anxiety   . Depression   . Hyperlipidemia   . Substance abuse   . Seizures     none for last 6-63months  . Hypertension, essential, benign 08/02/2012    Past Surgical History  Procedure Laterality  Date  . Fracture surgery      right hip and femur  . Joint replacement      right hip  . Tracheostomy  08/1999    Had pneumoniaand was in a coma 10 days  . Hernia repair  07/2009    umbilical hernia/ got infected    Family History  Problem Relation Age of Onset  . Heart disease Mother   . Diabetes Father   . Heart disease Brother     History   Social History  . Marital Status: Single    Spouse Name: N/A    Number of Children: N/A  . Years of Education: N/A   Occupational History  . Not on file.   Social History Main Topics  . Smoking status: Current Every Day Smoker -- 0.30 packs/day for 30 years    Types: Cigarettes  . Smokeless tobacco: Never Used  . Alcohol Use: No  . Drug Use: No  . Sexually Active: Not Currently   Other Topics Concern  . Not on file   Social History Narrative  . No narrative on file    Review of Systems:  All systems reviewed.  They are negative to the above problem except as previously stated.  Vital Signs: BP 138/72  Pulse 86  Ht 6' (1.829 m)  Wt 219 lb (99.338 kg)  BMI 29.7 kg/m2  Physical Exam Patient is in NAD HEENT:  Normocephalic, atraumatic. EOMI, PERRLA.  Neck: JVP is normal.  No  bruits.  Lungs: clear to auscultation. No rales no wheezes.  Heart: Regular rate and rhythm. Normal S1, S2. No S3.   No significant murmurs. PMI not displaced.  Abdomen:  Supple, nontender. Normal bowel sounds. No masses. No hepatomegaly.  Extremities:   Good distal pulses throughout. No lower extremity edema.  Musculoskeletal :moving all extremities.  Neuro:   alert and oriented x3.  CN II-XII grossly intact.   Assessment and Plan:  1  Orthostatic hypotension  BP drops with standing but it does not quite meet criteria for othrostasis Patient symptomatically better.   My plan was to continue current regmein.  Labs though showed K was low at 2.4 Instead of adding supplements will replete over a few days then stop K D/C florinef Check BMET on Monday  Patient will need f/u in 1 month  Continue pindolol   2.  HTN  Continue pindolol and cozaar.

## 2013-02-23 NOTE — Patient Instructions (Addendum)
LABS TODAY:  BMET  Your physician wants you to follow-up in: 3 months with Dr. Tenny Craw.  You will receive a reminder letter in the mail two months in advance. If you don't receive a letter, please call our office to schedule the follow-up appointment.

## 2013-02-26 ENCOUNTER — Other Ambulatory Visit: Payer: Self-pay | Admitting: *Deleted

## 2013-02-26 ENCOUNTER — Other Ambulatory Visit: Payer: Self-pay | Admitting: Family Medicine

## 2013-02-26 ENCOUNTER — Other Ambulatory Visit (INDEPENDENT_AMBULATORY_CARE_PROVIDER_SITE_OTHER): Payer: Medicare Other

## 2013-02-26 DIAGNOSIS — E876 Hypokalemia: Secondary | ICD-10-CM

## 2013-02-26 LAB — BASIC METABOLIC PANEL
CO2: 25 mEq/L (ref 19–32)
Chloride: 105 mEq/L (ref 96–112)
Creatinine, Ser: 0.9 mg/dL (ref 0.4–1.5)
Sodium: 137 mEq/L (ref 135–145)

## 2013-02-28 ENCOUNTER — Encounter: Payer: Self-pay | Admitting: Family Medicine

## 2013-02-28 ENCOUNTER — Ambulatory Visit (INDEPENDENT_AMBULATORY_CARE_PROVIDER_SITE_OTHER): Payer: Medicare Other | Admitting: Family Medicine

## 2013-02-28 VITALS — BP 148/81 | HR 77 | Temp 98.7°F | Ht 72.0 in | Wt 216.0 lb

## 2013-02-28 DIAGNOSIS — R112 Nausea with vomiting, unspecified: Secondary | ICD-10-CM

## 2013-02-28 DIAGNOSIS — E876 Hypokalemia: Secondary | ICD-10-CM

## 2013-02-28 DIAGNOSIS — R197 Diarrhea, unspecified: Secondary | ICD-10-CM

## 2013-02-28 DIAGNOSIS — I10 Essential (primary) hypertension: Secondary | ICD-10-CM

## 2013-02-28 NOTE — Patient Instructions (Addendum)
It was nice to see you today, Jose Li. I will recheck your Potassium level and call you with results. Continue taking Potassium daily until we call you. Schedule follow up appointment with me in 1-2 months or sooner as needed.  You may have a GI virus causing vomiting and diarrhea x 3 days. If you develop fever, bloody diarrhea, or persistent vomiting, please return to clinic or go to ER.

## 2013-02-28 NOTE — Assessment & Plan Note (Signed)
BP could be better controlled.  Currently BP is 148/81.  He is followed by [facility] for syncope spells.  Medications have been changed per progress note.  Continue current regimen.  Follow up with me in 1-2 months or sooner as needed.

## 2013-02-28 NOTE — Progress Notes (Signed)
  Subjective:    Patient ID: Jose Li, male    DOB: 31-Aug-1956, 57 y.o.   MRN: [id number]  HPI  Patient presents to clinic for follow up hypertension and to get blood work done.  Potassium was 2.1 at Cardiology office about 2 weeks ago.  He is taking K-Dur 60 mg once per day.  K on 4/14 was 2.6.    Denies any chest pain, SOB, but does endorse heart palpitations.  Does endorse leg cramping, but this has been on a chronic issue.  Endorses nausea, loose stool,  and vomited x 3 days after eating.  No sick contacts.  Decreased appetite x 3 days.  No bloody stools or abdominal pain.  BP 148/81.  Since he has been going to [facility] for fainting spells, his medications have been changed.  He is no longer taking Propanolol for migraine PPx.  He is no longer taking HCTZ.  He is now taking Cozaar and Pindolol.  He denies any recent migraines.  Denies any blurry vision, HA, SOB, CP, fatigue, or abdominal pain.  Review of Systems Per HPI    Objective:   Physical Exam  Constitutional: He appears well-nourished. No distress.  Cardiovascular: Normal rate and normal heart sounds.   Pulmonary/Chest: Effort normal and breath sounds normal. He has no wheezes. He has no rales.  Abdominal: Soft. Bowel sounds are normal. He exhibits no distension. There is no tenderness. There is no rebound and no guarding.  Central obesity  Musculoskeletal:  Trace pitting pedal edema RT > LT  Skin: Skin is warm and dry. No rash noted.       Assessment & Plan:

## 2013-02-28 NOTE — Assessment & Plan Note (Signed)
Afebrile today and non-toxic appearing.  Symptoms could be due to GI virus.  Offered anti-emetic but patient wants to see if symptoms are self-limiting.  No acute abdomen on exam.  Red flags reviewed.  Patient to follow up with me if symptoms worsen.

## 2013-02-28 NOTE — Assessment & Plan Note (Signed)
On 02/26/13, K was 2.6.  Patient is not taking Lasix, so unsure why K is low.  He has been taking K-Dur 60 daily since then.  Wiill repeat BMET today and notify of results.

## 2013-03-01 ENCOUNTER — Other Ambulatory Visit: Payer: Self-pay | Admitting: Neurology

## 2013-03-01 ENCOUNTER — Telehealth: Payer: Self-pay | Admitting: Family Medicine

## 2013-03-01 DIAGNOSIS — E876 Hypokalemia: Secondary | ICD-10-CM

## 2013-03-01 LAB — BASIC METABOLIC PANEL
BUN: 3 mg/dL — ABNORMAL LOW (ref 6–23)
Chloride: 106 mEq/L (ref 96–112)
Glucose, Bld: 103 mg/dL — ABNORMAL HIGH (ref 70–99)
Potassium: 3.6 mEq/L (ref 3.5–5.3)
Sodium: 140 mEq/L (ref 135–145)

## 2013-03-01 NOTE — Telephone Encounter (Signed)
Please let Jose Li know that potassium was low normal.  He should take Potassium through the weekend, then he should schedule a lab visit in ONE week to recheck level.  Thanks.  I'll put the future order in.

## 2013-03-05 ENCOUNTER — Telehealth: Payer: Self-pay | Admitting: Internal Medicine

## 2013-03-05 DIAGNOSIS — E876 Hypokalemia: Secondary | ICD-10-CM

## 2013-03-05 MED ORDER — POTASSIUM CHLORIDE CRYS ER 20 MEQ PO TBCR: 20.0000 meq | EXTENDED_RELEASE_TABLET | Freq: Three times a day (TID) | ORAL | Status: DC

## 2013-03-05 NOTE — Telephone Encounter (Signed)
Called patient back. He wanted to know how long he needs to stay on KCL. Currently taking KCL 3 times per day. Advised will forward last set of labs to Dr.Ross for review and we will call him back for instructions. He will stay on the same dose for now. Has only a few KCL tabs left. Will send in script to [facility] on [address].

## 2013-03-05 NOTE — Telephone Encounter (Signed)
New problem     1. C/O sided from medication  K+.      2. Test results from [facility] practice.

## 2013-03-06 NOTE — Telephone Encounter (Signed)
Stop KCL He has stopped Florinef KCL supplements were only temporary to replace what he had lost Do not fill refills called in  D/C Rx  Needs BMET tomorrow or Thurs  Keep f/u appt as scheduled

## 2013-03-06 NOTE — Telephone Encounter (Signed)
Called patient and advised him to stop KCL for now and return for a BMET on 4/24 per Dr.Ross. Patient verbalized understanding.

## 2013-03-08 ENCOUNTER — Other Ambulatory Visit (INDEPENDENT_AMBULATORY_CARE_PROVIDER_SITE_OTHER): Payer: Medicare Other

## 2013-03-08 DIAGNOSIS — E876 Hypokalemia: Secondary | ICD-10-CM

## 2013-03-08 LAB — BASIC METABOLIC PANEL
BUN: 3 mg/dL — ABNORMAL LOW (ref 6–23)
CO2: 23 mEq/L (ref 19–32)
Calcium: 8.6 mg/dL (ref 8.4–10.5)
GFR: 91.88 mL/min (ref >60.00)
Glucose, Bld: 124 mg/dL — ABNORMAL HIGH (ref 70–99)
Potassium: 3.5 mEq/L (ref 3.5–5.1)
Sodium: 135 mEq/L (ref 135–145)

## 2013-03-22 ENCOUNTER — Other Ambulatory Visit: Payer: Self-pay | Admitting: Neurology

## 2013-03-30 ENCOUNTER — Encounter: Payer: Self-pay | Admitting: Internal Medicine

## 2013-03-30 ENCOUNTER — Ambulatory Visit (INDEPENDENT_AMBULATORY_CARE_PROVIDER_SITE_OTHER): Payer: Medicare Other | Admitting: Internal Medicine

## 2013-03-30 VITALS — BP 105/70 | HR 96 | Ht 72.0 in | Wt 210.8 lb

## 2013-03-30 DIAGNOSIS — I10 Essential (primary) hypertension: Secondary | ICD-10-CM

## 2013-03-30 LAB — BASIC METABOLIC PANEL
BUN: 6 mg/dL (ref 6–23)
CO2: 28 mEq/L (ref 19–32)
Calcium: 8.9 mg/dL (ref 8.4–10.5)
Chloride: 103 mEq/L (ref 96–112)
Creatinine, Ser: 1.1 mg/dL (ref 0.4–1.5)

## 2013-03-30 NOTE — Patient Instructions (Addendum)
STOP Cozaar.  LABS TODAY:  BMET  Your physician recommends that you schedule a follow-up appointment in: 1 month with Dr. Tenny Craw.

## 2013-04-02 NOTE — Progress Notes (Signed)
HPI Patient is a 57 yo who has a history of orthostatic hypotension and syncope.  I saw him last month.  He was doing better and feeling good on florinef, pindolol.  Cozaar was added by primary MD (25 mg ) because he was hypertensive. When I saw him I checked a BMET and he was severely hypokalemic (He did nto return 10 days after starting florinef as scheduled)  I recomm d/cing florinef  K was repleted Since stopping florinf he has been dizzier.  He denies syncope.  Not feeling as good Says he is drinking fluids. Allergies  Allergen Reactions  . Erythromycin   . Hctz (Hydrochlorothiazide)     "Makes him faint" per patient Dr. Tenny Craw ([place]) took him off  . Propranolol     "Makes him faint" per patient Dr. Tenny Craw ([place]) took him off      Current Outpatient Prescriptions  Medication Sig Dispense Refill  . aspirin 81 MG EC tablet Take 81 mg by mouth daily.        Marland Kitchen atorvastatin (LIPITOR) 20 MG tablet TAKE ONE TABLET BY MOUTH EVERY DAY  30 tablet  2  . benztropine (COGENTIN) 1 MG tablet Take 1 mg by mouth 2 (two) times daily.        . cetirizine (ZYRTEC) 10 MG tablet TAKE ONE TABLET BY MOUTH EVERY DAY  30 tablet  10  . DULoxetine (CYMBALTA) 60 MG capsule Take 60 mg by mouth daily. 20 MG added in the afternoons Total of 80 MG daily      . fludrocortisone (FLORINEF) 0.1 MG tablet Take 0.5 tablets (0.05 mg total) by mouth 2 (two) times daily. Twice daily at 8AM and 2PM.  60 tablet  3  . ibuprofen (ADVIL,MOTRIN) 600 MG tablet TAKE ONE TABLET BY MOUTH EVERY 6 HOURS AS NEEDED FOR HEADACHE  30 tablet  3  . levETIRAcetam (KEPPRA) 500 MG tablet Take 1,500 mg by mouth 2 (two) times daily.       Marland Kitchen LYRICA 75 MG capsule TAKE ONE CAPSULE BY MOUTH TWICE DAILY  60 capsule  5  . Multiple Vitamins-Minerals (MENS MULTIVITAMIN PLUS) TABS Take 1 tablet by mouth daily.        . pindolol (VISKEN) 5 MG tablet Take 0.5 tablets (2.5 mg total) by mouth 2 (two) times daily.  60 tablet  3  . risperiDONE (RISPERDAL) 2 MG  tablet Take 2 mg by mouth at bedtime.        . thiamine 100 MG tablet Take 100 mg by mouth daily.        . traZODone (DESYREL) 100 MG tablet 150 mg at bedtime.        Current Facility-Administered Medications  Medication Dose Route Frequency Provider Last Rate Last Dose  . 0.9 %  sodium chloride infusion  500 mL Intravenous Continuous Hilarie Fredrickson, MD        Past Medical History  Diagnosis Date  . Allergy   . Anxiety   . Depression   . Hyperlipidemia   . Substance abuse   . Seizures     none for last 6-26months  . Hypertension, essential, benign 08/02/2012    Past Surgical History  Procedure Laterality Date  . Fracture surgery      right hip and femur  . Joint replacement      right hip  . Tracheostomy  08/1999    Had pneumoniaand was in a coma 10 days  . Hernia repair  07/2009    umbilical  hernia/ got infected    Family History  Problem Relation Age of Onset  . Heart disease Mother   . Diabetes Father   . Heart disease Brother     History   Social History  . Marital Status: Single    Spouse Name: N/A    Number of Children: N/A  . Years of Education: N/A   Occupational History  . Not on file.   Social History Main Topics  . Smoking status: Current Every Day Smoker -- 0.30 packs/day for 30 years    Types: Cigarettes  . Smokeless tobacco: Never Used  . Alcohol Use: No  . Drug Use: No  . Sexually Active: Not Currently   Other Topics Concern  . Not on file   Social History Narrative  . No narrative on file    Review of Systems:  All systems reviewed.  They are negative to the above problem except as previously stated.  Vital Signs: BP 105/70  Pulse 96  Ht 6' (1.829 m)  Wt 210 lb 12.8 oz (95.618 kg)  BMI 28.58 kg/m2  SpO2 96%  Physical Exam Patient is in NAD HEENT:  Normocephalic, atraumatic. EOMI, PERRLA.  Neck: JVP is normal.  No bruits.  Lungs: clear to auscultation. No rales no wheezes.  Heart: Regular rate and rhythm. Normal S1, S2. No S3.    No significant murmurs. PMI not displaced.  Abdomen:  Supple, nontender. Normal bowel sounds. No masses. No hepatomegaly.  Extremities:   Good distal pulses throughout. No lower extremity edema.  Musculoskeletal :moving all extremities.  Neuro:   alert and oriented x3.  CN II-XII grossly intact.   Assessment and Plan:  1.  Orthostatic hypotension/syncope.  Unfortunately developed hypokalemia on Florinef.  I am not eager to place him on K supplemtns to use it  May need to though  For now I would keep on pindolol Stop Cozaar.  Keep drinking fluids.  WIll follow up and reassess in 1 month.  May need to add back florinef with K at that time.

## 2013-04-27 ENCOUNTER — Telehealth: Payer: Self-pay | Admitting: Neurology

## 2013-04-27 NOTE — Telephone Encounter (Signed)
Patient needs to be rescheduled from the May 02, 2013 appt with Dr. Anne Hahn.  Patient needs a 30 minute slot.

## 2013-04-30 ENCOUNTER — Telehealth: Payer: Self-pay | Admitting: Neurology

## 2013-04-30 NOTE — Telephone Encounter (Signed)
States he got a call from out office needing to reschedule his appointment.  The next available is in January 2015 but he doesn't feel he can wait that long.  States he needs to be seen much sooner.  Offered to schedule and put on a wait list but patient refused.  Please call.

## 2013-05-01 ENCOUNTER — Telehealth: Payer: Self-pay | Admitting: *Deleted

## 2013-05-01 NOTE — Telephone Encounter (Signed)
Patient excepted appointment

## 2013-05-02 ENCOUNTER — Ambulatory Visit: Payer: Self-pay | Admitting: Neurology

## 2013-05-07 ENCOUNTER — Encounter: Payer: Self-pay | Admitting: Family Medicine

## 2013-05-07 ENCOUNTER — Ambulatory Visit (INDEPENDENT_AMBULATORY_CARE_PROVIDER_SITE_OTHER): Payer: Medicare Other | Admitting: Family Medicine

## 2013-05-07 VITALS — BP 112/71 | HR 94 | Temp 99.1°F | Ht 72.0 in | Wt 211.4 lb

## 2013-05-07 DIAGNOSIS — E785 Hyperlipidemia, unspecified: Secondary | ICD-10-CM

## 2013-05-07 DIAGNOSIS — I10 Essential (primary) hypertension: Secondary | ICD-10-CM

## 2013-05-07 DIAGNOSIS — G43909 Migraine, unspecified, not intractable, without status migrainosus: Secondary | ICD-10-CM

## 2013-05-07 NOTE — Patient Instructions (Addendum)
I will call you this week if your lab results are abnormal. Your blood pressure today was perfect.  Keep up the good work. Schedule a complete physical in 6 months for annual lab work.  It was my pleasure taking care of you and I will miss you.

## 2013-05-07 NOTE — Progress Notes (Signed)
  Subjective:    Patient ID: Jose Li, male    DOB: 1956/01/04, 57 y.o.   MRN: [id number]  HPI  Patient presents to clinic for BP follow up and to say goodbye before I graduate.  He has been seeing Cardiology for orthostatic hypotension.  Takes Pindolol per Cardiology.  Still complains of intermittent dizziness, about 2 times per week.  He experiences dizziness only with positional changes.  He has stopped taking Florinef and Cozaar.  BP is well controlled today.   Denies any HA or migraine.  Pindolol seems to be helping prevent this.    He has had intermittent elevated CBG in the past, so he agrees to get A1c checked today.  Review of Systems Per HPI    Objective:   Physical Exam  Constitutional: He appears well-nourished. No distress.  Cardiovascular: Normal rate and regular rhythm.   Pulmonary/Chest: Effort normal and breath sounds normal.  Musculoskeletal: He exhibits no edema.      Assessment & Plan:

## 2013-05-07 NOTE — Assessment & Plan Note (Signed)
Now he has orthostatic hypotension.  Continue Pindolol.  Appreciate Cardiology recommendations.

## 2013-05-07 NOTE — Assessment & Plan Note (Signed)
Well controlled. No recent exacerbations

## 2013-05-11 ENCOUNTER — Other Ambulatory Visit: Payer: Self-pay | Admitting: *Deleted

## 2013-05-11 ENCOUNTER — Ambulatory Visit (INDEPENDENT_AMBULATORY_CARE_PROVIDER_SITE_OTHER): Payer: Medicare Other | Admitting: Internal Medicine

## 2013-05-11 VITALS — BP 96/66 | HR 95

## 2013-05-11 DIAGNOSIS — R55 Syncope and collapse: Secondary | ICD-10-CM

## 2013-05-11 LAB — BASIC METABOLIC PANEL
BUN: 2 mg/dL — ABNORMAL LOW (ref 6–23)
Chloride: 105 mEq/L (ref 96–112)
Glucose, Bld: 76 mg/dL (ref 70–99)
Potassium: 3 mEq/L — ABNORMAL LOW (ref 3.5–5.1)

## 2013-05-11 MED ORDER — MIDODRINE HCL 2.5 MG PO TABS: 2.5000 mg | ORAL_TABLET | Freq: Two times a day (BID) | ORAL | Status: DC

## 2013-05-11 MED ORDER — POTASSIUM CHLORIDE 20 MEQ PO PACK: 20.0000 meq | PACK | Freq: Every day | ORAL | Status: DC

## 2013-05-11 MED ORDER — FLUDROCORTISONE ACETATE 0.1 MG PO TABS: 0.0500 mg | ORAL_TABLET | Freq: Two times a day (BID) | ORAL | Status: DC

## 2013-05-11 NOTE — Progress Notes (Signed)
HPI Patient is a 57 yo who has a history of dysautonomia.  I saw him in May  Unfort he did not tolerate Florinef due to hypokalemia  When i saw him last I stopped Cozaar. SInce I saw him he has continued to feel dizzy at times  Denies syncope.   Allergies  Allergen Reactions  . Erythromycin   . Hctz (Hydrochlorothiazide)     "Makes him faint" per patient Dr. Tenny Craw ([place]) took him off  . Propranolol     "Makes him faint" per patient Dr. Tenny Craw ([place]) took him off      Current Outpatient Prescriptions  Medication Sig Dispense Refill  . aspirin 81 MG EC tablet Take 81 mg by mouth daily.        Marland Kitchen atorvastatin (LIPITOR) 20 MG tablet TAKE ONE TABLET BY MOUTH EVERY DAY  30 tablet  2  . benztropine (COGENTIN) 1 MG tablet Take 1 mg by mouth 2 (two) times daily.        . cetirizine (ZYRTEC) 10 MG tablet TAKE ONE TABLET BY MOUTH EVERY DAY  30 tablet  10  . DULoxetine (CYMBALTA) 20 MG capsule Take 20 mg by mouth daily at 12 noon.      . DULoxetine (CYMBALTA) 60 MG capsule Take 60 mg by mouth 2 (two) times daily. 20 MG added in the afternoons      . ibuprofen (ADVIL,MOTRIN) 600 MG tablet TAKE ONE TABLET BY MOUTH EVERY 6 HOURS AS NEEDED FOR HEADACHE  30 tablet  3  . levETIRAcetam (KEPPRA) 500 MG tablet Take 1,000 mg by mouth 2 (two) times daily.       Marland Kitchen LYRICA 75 MG capsule TAKE ONE CAPSULE BY MOUTH TWICE DAILY  60 capsule  5  . pindolol (VISKEN) 5 MG tablet Take 0.5 tablets (2.5 mg total) by mouth 2 (two) times daily.  60 tablet  3  . risperiDONE (RISPERDAL) 2 MG tablet Take 1/2 tab in the am, 1/2 tab at lunch, 1 tab at night      . thiamine 100 MG tablet Take 100 mg by mouth daily.        . traZODone (DESYREL) 100 MG tablet 150 mg at bedtime.       . midodrine (PROAMATINE) 2.5 MG tablet Take 1 tablet (2.5 mg total) by mouth 2 (two) times daily. At 8am and 2pm.  180 tablet  3  . potassium chloride (KLOR-CON) 20 MEQ packet Take 20 mEq by mouth daily.  90 tablet  3   Current Facility-Administered  Medications  Medication Dose Route Frequency Provider Last Rate Last Dose  . 0.9 %  sodium chloride infusion  500 mL Intravenous Continuous Hilarie Fredrickson, MD        Past Medical History  Diagnosis Date  . Allergy   . Anxiety   . Depression   . Hyperlipidemia   . Substance abuse   . Seizures     none for last 6-29months  . Hypertension, essential, benign 08/02/2012    Past Surgical History  Procedure Laterality Date  . Fracture surgery      right hip and femur  . Joint replacement      right hip  . Tracheostomy  08/1999    Had pneumoniaand was in a coma 10 days  . Hernia repair  07/2009    umbilical hernia/ got infected    Family History  Problem Relation Age of Onset  . Heart disease Mother   . Diabetes Father   .  Heart disease Brother     History   Social History  . Marital Status: Single    Spouse Name: N/A    Number of Children: N/A  . Years of Education: N/A   Occupational History  . Not on file.   Social History Main Topics  . Smoking status: Current Every Day Smoker -- 0.30 packs/day for 30 years    Types: Cigarettes  . Smokeless tobacco: Never Used  . Alcohol Use: No  . Drug Use: No  . Sexually Active: Not Currently   Other Topics Concern  . Not on file   Social History Narrative  . No narrative on file    Review of Systems:  All systems reviewed.  They are negative to the above problem except as previously stated.  Vital Signs: BP 96/66  Pulse 95  Physical Exam Patient is in NAD HEENT:  Normocephalic, atraumatic. EOMI, PERRLA.  Neck: JVP is normal.  No bruits.  Lungs: clear to auscultation. No rales no wheezes.  Heart: Regular rate and rhythm. Normal S1, S2. No S3.   No significant murmurs. PMI not displaced.  Abdomen:  Supple, nontender. Normal bowel sounds. No masses. No hepatomegaly.  Extremities:   Good distal pulses throughout. No lower extremity edema.  Musculoskeletal :moving all extremities.  Neuro:   alert and oriented x3.  CN  II-XII grossly intact.   Assessment and Plan:  1. Dysautonomia.  Patient still dizzy.  Orthostatics are positive He did very well with florinef  I would recomm resuming 0.05 mg at 8 and 2 PM  Continue pindolol.  Add KCL supplement 20 mEq per day.   WIll check BMET today.\ Encouraged him to stay active.  Stay hydrated.

## 2013-05-11 NOTE — Patient Instructions (Addendum)
Start Florinef 0.05mg  (1/2 tablet) twice daily.  Start Potassium once daily.  BMET today  Your physician recommends that you return for lab work on May 17, 2013 for a BMET.

## 2013-05-17 ENCOUNTER — Other Ambulatory Visit (INDEPENDENT_AMBULATORY_CARE_PROVIDER_SITE_OTHER): Payer: Medicare Other

## 2013-05-17 ENCOUNTER — Other Ambulatory Visit: Payer: Self-pay | Admitting: *Deleted

## 2013-05-17 DIAGNOSIS — R55 Syncope and collapse: Secondary | ICD-10-CM

## 2013-05-17 LAB — BASIC METABOLIC PANEL
CO2: 26 mEq/L (ref 19–32)
Calcium: 8.5 mg/dL (ref 8.4–10.5)
Creatinine, Ser: 1 mg/dL (ref 0.4–1.5)
Glucose, Bld: 121 mg/dL — ABNORMAL HIGH (ref 70–99)

## 2013-05-17 MED ORDER — POTASSIUM CHLORIDE 20 MEQ PO PACK: 20.0000 meq | PACK | Freq: Every day | ORAL | Status: DC

## 2013-05-17 NOTE — Telephone Encounter (Signed)
Refill for potassium sent

## 2013-05-21 ENCOUNTER — Other Ambulatory Visit: Payer: Self-pay | Admitting: Family Medicine

## 2013-05-22 ENCOUNTER — Telehealth: Payer: Self-pay | Admitting: Internal Medicine

## 2013-05-22 DIAGNOSIS — E876 Hypokalemia: Secondary | ICD-10-CM

## 2013-05-22 NOTE — Telephone Encounter (Signed)
Will discuss with Dr Tenny Craw, patient has increase his potassium to twice a day

## 2013-05-22 NOTE — Telephone Encounter (Signed)
Follow Up    Pt calling following up on a call received concerning his blood work. Please call.

## 2013-05-23 NOTE — Telephone Encounter (Signed)
Patient has continued to have loose stools and continued potassium twice a day. He does think stools secondary to virus, denies any dark stools. Only having about 2-3 loose stools a day now.  Will have patient come in tomorrow for lab recheck.

## 2013-05-24 ENCOUNTER — Other Ambulatory Visit (INDEPENDENT_AMBULATORY_CARE_PROVIDER_SITE_OTHER): Payer: Medicare Other

## 2013-05-24 DIAGNOSIS — E876 Hypokalemia: Secondary | ICD-10-CM

## 2013-05-24 LAB — BASIC METABOLIC PANEL WITH GFR
BUN: 4 mg/dL — ABNORMAL LOW (ref 6–23)
CO2: 25 meq/L (ref 19–32)
Calcium: 8.7 mg/dL (ref 8.4–10.5)
Chloride: 104 meq/L (ref 96–112)
Creatinine, Ser: 1.1 mg/dL (ref 0.4–1.5)
GFR: 72.25 mL/min
Glucose, Bld: 87 mg/dL (ref 70–99)
Potassium: 3.7 meq/L (ref 3.5–5.1)
Sodium: 132 meq/L — ABNORMAL LOW (ref 135–145)

## 2013-05-25 ENCOUNTER — Ambulatory Visit: Payer: Medicare Other | Admitting: Internal Medicine

## 2013-05-28 ENCOUNTER — Telehealth: Payer: Self-pay | Admitting: Internal Medicine

## 2013-05-28 NOTE — Telephone Encounter (Signed)
Pt is aware of lab results and MD's recommendations.  Pt said that he still having dizziness, but  not as bad as when he saw Dr. Tenny Craw  on his last office visit. Pt would like to know when Dr. Tenny Craw wants  to come for an Office visit again.

## 2013-05-28 NOTE — Telephone Encounter (Signed)
Follow Up ° ° ° ° °Pt calling in following up on test results. Please call. °

## 2013-05-29 ENCOUNTER — Ambulatory Visit: Payer: Self-pay | Admitting: Neurology

## 2013-05-29 NOTE — Telephone Encounter (Signed)
Left follow up ov date (8/22) on machine

## 2013-05-29 NOTE — Telephone Encounter (Signed)
F/U in 4 to 6 wks. 

## 2013-05-29 NOTE — Telephone Encounter (Signed)
F/U in 4 to 6 wks.

## 2013-06-08 ENCOUNTER — Other Ambulatory Visit: Payer: Self-pay | Admitting: *Deleted

## 2013-06-08 DIAGNOSIS — E876 Hypokalemia: Secondary | ICD-10-CM

## 2013-06-08 MED ORDER — POTASSIUM CHLORIDE CRYS ER 20 MEQ PO TBCR: 20.0000 meq | EXTENDED_RELEASE_TABLET | Freq: Every day | ORAL | Status: DC

## 2013-06-08 NOTE — Telephone Encounter (Signed)
Pharmacy faxed requesting Rx for K+ tablets instead of packets, patient been taking tabs. Ok tablets

## 2013-06-20 ENCOUNTER — Other Ambulatory Visit: Payer: Self-pay | Admitting: Family Medicine

## 2013-07-06 ENCOUNTER — Ambulatory Visit (INDEPENDENT_AMBULATORY_CARE_PROVIDER_SITE_OTHER): Payer: Medicare Other | Admitting: Internal Medicine

## 2013-07-06 ENCOUNTER — Other Ambulatory Visit: Payer: Self-pay

## 2013-07-06 ENCOUNTER — Encounter: Payer: Self-pay | Admitting: Internal Medicine

## 2013-07-06 VITALS — BP 119/74 | HR 86 | Ht 72.0 in | Wt 211.0 lb

## 2013-07-06 DIAGNOSIS — Z79899 Other long term (current) drug therapy: Secondary | ICD-10-CM

## 2013-07-06 DIAGNOSIS — R55 Syncope and collapse: Secondary | ICD-10-CM

## 2013-07-06 DIAGNOSIS — E876 Hypokalemia: Secondary | ICD-10-CM

## 2013-07-06 LAB — BASIC METABOLIC PANEL
Chloride: 103 mEq/L (ref 96–112)
Creatinine, Ser: 1 mg/dL (ref 0.4–1.5)
Potassium: 2.9 mEq/L — ABNORMAL LOW (ref 3.5–5.1)
Sodium: 137 mEq/L (ref 135–145)

## 2013-07-06 MED ORDER — POTASSIUM CHLORIDE CRYS ER 20 MEQ PO TBCR: 20.0000 meq | EXTENDED_RELEASE_TABLET | Freq: Every day | ORAL | Status: DC

## 2013-07-06 NOTE — Patient Instructions (Signed)
Your physician recommends that you have lab work today: BMP  Your physician wants you to follow-up in: January 2015 with Dr Tenny Craw. You will receive a reminder letter in the mail two months in advance. If you don't receive a letter, please call our office to schedule the follow-up appointment.  Your physician recommends that you continue on your current medications as directed. Please refer to the Current Medication list given to you today.

## 2013-07-06 NOTE — Progress Notes (Signed)
HPI Patient is a 57 yo who has a history of dysautonomia. I saw him in May Unfort he did not tolerate Florinef due to hypokalemia .   I saw him in June  I recomm a retrial of Florinef with K since his symtoms improved so much.  The patient did not tolearte He is currently on pindolol and midodrine.   He is doing fairly well.  Notes occasional dizziness  No syncope   He has occasional diarrhea. Allergies     Allergies  Allergen Reactions  . Erythromycin   . Hctz [Hydrochlorothiazide]     "Makes him faint" per patient Dr. Tenny Craw ([place]) took him off  . Propranolol     "Makes him faint" per patient Dr. Tenny Craw ([place]) took him off      Current Outpatient Prescriptions  Medication Sig Dispense Refill  . aspirin 81 MG EC tablet Take 81 mg by mouth daily.        Marland Kitchen atorvastatin (LIPITOR) 20 MG tablet TAKE ONE TABLET BY MOUTH EVERY DAY  30 tablet  11  . benztropine (COGENTIN) 1 MG tablet Take 1 mg by mouth 2 (two) times daily.        . cetirizine (ZYRTEC) 10 MG tablet TAKE ONE TABLET BY MOUTH EVERY DAY  30 tablet  10  . DULoxetine (CYMBALTA) 20 MG capsule Take 20 mg by mouth daily at 12 noon.      . DULoxetine (CYMBALTA) 60 MG capsule Take 60 mg by mouth 2 (two) times daily. 20 MG added in the afternoons      . ibuprofen (ADVIL,MOTRIN) 600 MG tablet TAKE ONE TABLET BY MOUTH EVERY 6 HOURS AS NEEDED FOR HEADACHE  30 tablet  3  . levETIRAcetam (KEPPRA) 500 MG tablet Take 1,000 mg by mouth 2 (two) times daily.       Marland Kitchen LYRICA 75 MG capsule TAKE ONE CAPSULE BY MOUTH TWICE DAILY  60 capsule  5  . midodrine (PROAMATINE) 2.5 MG tablet Take 1 tablet (2.5 mg total) by mouth 2 (two) times daily. At 8am and 2pm.  180 tablet  3  . pindolol (VISKEN) 5 MG tablet Take 0.5 tablets (2.5 mg total) by mouth 2 (two) times daily.  60 tablet  3  . potassium chloride SA (K-DUR,KLOR-CON) 20 MEQ tablet Take 1 tablet (20 mEq total) by mouth daily.  90 tablet  3  . risperiDONE (RISPERDAL) 2 MG tablet Take 1/2 tab in the  am, 1/2 tab at lunch, 2 tab at night      . thiamine 100 MG tablet Take 100 mg by mouth daily.        . traZODone (DESYREL) 100 MG tablet 150 mg at bedtime.        No current facility-administered medications for this visit.    Past Medical History  Diagnosis Date  . Allergy   . Anxiety   . Depression   . Hyperlipidemia   . Substance abuse   . Seizures     none for last 6-84months  . Hypertension, essential, benign 08/02/2012    Past Surgical History  Procedure Laterality Date  . Fracture surgery      right hip and femur  . Joint replacement      right hip  . Tracheostomy  08/1999    Had pneumoniaand was in a coma 10 days  . Hernia repair  07/2009    umbilical hernia/ got infected    Family History  Problem Relation Age of Onset  .  Heart disease Mother   . Diabetes Father   . Heart disease Brother     History   Social History  . Marital Status: Single    Spouse Name: N/A    Number of Children: N/A  . Years of Education: N/A   Occupational History  . Not on file.   Social History Main Topics  . Smoking status: Current Every Day Smoker -- 0.30 packs/day for 30 years    Types: Cigarettes  . Smokeless tobacco: Never Used  . Alcohol Use: No  . Drug Use: No  . Sexual Activity: Not Currently   Other Topics Concern  . Not on file   Social History Narrative  . No narrative on file    Review of Systems:  All systems reviewed.  They are negative to the above problem except as previously stated.  Vital Signs: BP 119/74  Pulse 86  Ht 6' (1.829 m)  Wt 211 lb (95.709 kg)  BMI 28.61 kg/m2  Physical Exam [place] is in NAD HEENT:  Normocephalic, atraumatic. EOMI, PERRLA.  Neck: JVP is normal.  No bruits.  Lungs: clear to auscultation. No rales no wheezes.  Heart: Regular rate and rhythm. Normal S1, S2. No S3.   No significant murmurs. PMI not displaced.  Abdomen:  Supple, nontender. Normal bowel sounds. No masses. No hepatomegaly.  Extremities:   Good distal  pulses throughout. No lower extremity edema.  Musculoskeletal :moving all extremities.  Neuro:   alert and oriented x3.  CN II-XII grossly intact.   Assessment and Plan:  1.  Dysautonomia.  Patients symptoms have improved.  Still dizzy at times but not severe I would keep on same regimen    2.  Hypokalemia  Would check BMET today.

## 2013-07-13 ENCOUNTER — Other Ambulatory Visit (INDEPENDENT_AMBULATORY_CARE_PROVIDER_SITE_OTHER): Payer: Medicare Other

## 2013-07-13 DIAGNOSIS — E876 Hypokalemia: Secondary | ICD-10-CM

## 2013-07-13 LAB — BASIC METABOLIC PANEL
CO2: 23 mEq/L (ref 19–32)
GFR: 91.76 mL/min (ref >60.00)
Glucose, Bld: 101 mg/dL — ABNORMAL HIGH (ref 70–99)
Potassium: 3.5 mEq/L (ref 3.5–5.1)
Sodium: 135 mEq/L (ref 135–145)

## 2013-07-19 ENCOUNTER — Telehealth: Payer: Self-pay | Admitting: Internal Medicine

## 2013-07-19 NOTE — Telephone Encounter (Signed)
Spoke with pt, aware of lab results. 

## 2013-07-19 NOTE — Telephone Encounter (Signed)
Pt wants lab results from last friday

## 2013-08-29 ENCOUNTER — Other Ambulatory Visit: Payer: Self-pay | Admitting: Neurology

## 2013-08-30 NOTE — Telephone Encounter (Signed)
Rx signed and faxed.

## 2013-09-20 ENCOUNTER — Other Ambulatory Visit: Payer: Self-pay | Admitting: Internal Medicine

## 2013-09-21 ENCOUNTER — Encounter: Payer: Self-pay | Admitting: Family Medicine

## 2013-09-21 ENCOUNTER — Ambulatory Visit (INDEPENDENT_AMBULATORY_CARE_PROVIDER_SITE_OTHER): Payer: Medicare Other | Admitting: Family Medicine

## 2013-09-21 VITALS — BP 137/79 | HR 94 | Temp 98.2°F | Ht 72.0 in | Wt 214.6 lb

## 2013-09-21 DIAGNOSIS — Z23 Encounter for immunization: Secondary | ICD-10-CM

## 2013-09-21 DIAGNOSIS — Z0000 Encounter for general adult medical examination without abnormal findings: Secondary | ICD-10-CM | POA: Insufficient documentation

## 2013-09-21 DIAGNOSIS — F319 Bipolar disorder, unspecified: Secondary | ICD-10-CM

## 2013-09-21 DIAGNOSIS — I10 Essential (primary) hypertension: Secondary | ICD-10-CM

## 2013-09-21 DIAGNOSIS — E7800 Pure hypercholesterolemia, unspecified: Secondary | ICD-10-CM

## 2013-09-21 NOTE — Progress Notes (Signed)
Subjective:     Patient ID: Jose Li, male   DOB: [birth year], 57 y.o.   MRN: [id number]  HPI Annual exam:  Patient reports he is doing well. He is here for his annual exam. He has been feeling well. He currently being treated for Bipolar disorder at [facility]. He is in need for his yearly lipid profile and labs. He has no complaints today.   Flu shot today  Review of Systems  Constitutional: Negative for activity change, appetite change, fatigue and unexpected weight change.  HENT: Negative for drooling and hearing loss.   Eyes: Negative for photophobia and visual disturbance.  Respiratory: Negative for cough, chest tightness, shortness of breath and wheezing.   Cardiovascular: Negative for chest pain.  Gastrointestinal: Negative for abdominal pain, diarrhea, constipation, blood in stool, anal bleeding and rectal pain.  Endocrine: Negative for polydipsia and polyphagia.  Genitourinary: Negative for dysuria, urgency, frequency, hematuria and difficulty urinating.  Neurological: Negative for dizziness, syncope and headaches.       Objective:   Physical Exam BP 137/79  Pulse 94  Temp(Src) 98.2 F (36.8 C) (Oral)  Ht 6' (1.829 m)  Wt 214 lb 9.6 oz (97.342 kg)  BMI 29.10 kg/m2 Gen: NAD. pleasant HEENT: AT. [place]. Bilateral TM visualized and normal in appearance. Bilateral eyes without injections or icterus. MMM. Bilateral nares normal. Throat without erythema or exudates.  CV: RRR Chest: CTAB, no wheeze or crackles Abd: Soft.  NTND. BS present. No Masses palpated.  Ext: No erythema. No edema.  Neuro: Normal gait. PERLA. EOMi. Alert. Grossly intact.  Psych: Normal dress, demeanor and affect. Not manic or depressed in nature.

## 2013-09-21 NOTE — Patient Instructions (Signed)
Flu vac Hypertension As your heart beats, it forces blood through your arteries. This force is your blood pressure. If the pressure is too high, it is called hypertension (HTN) or high blood pressure. HTN is dangerous because you may have it and not know it. High blood pressure may mean that your heart has to work harder to pump blood. Your arteries may be narrow or stiff. The extra work puts you at risk for heart disease, stroke, and other problems.  Blood pressure consists of two numbers, a higher number over a lower, 110/72, for example. It is stated as "110 over 72." The ideal is below 120 for the top number (systolic) and under 80 for the bottom (diastolic). Write down your blood pressure today. You should pay close attention to your blood pressure if you have certain conditions such as:  Heart failure.  Prior heart attack.  Diabetes  Chronic kidney disease.  Prior stroke.  Multiple risk factors for heart disease. To see if you have HTN, your blood pressure should be measured while you are seated with your arm held at the level of the heart. It should be measured at least twice. A one-time elevated blood pressure reading (especially in the Emergency Department) does not mean that you need treatment. There may be conditions in which the blood pressure is different between your right and left arms. It is important to see your caregiver soon for a recheck. Most people have essential hypertension which means that there is not a specific cause. This type of high blood pressure may be lowered by changing lifestyle factors such as:  Stress.  Smoking.  Lack of exercise.  Excessive weight.  Drug/tobacco/alcohol use.  Eating less salt. Most people do not have symptoms from high blood pressure until it has caused damage to the body. Effective treatment can often prevent, delay or reduce that damage. TREATMENT  When a cause has been identified, treatment for high blood pressure is directed  at the cause. There are a large number of medications to treat HTN. These fall into several categories, and your caregiver will help you select the medicines that are best for you. Medications may have side effects. You should review side effects with your caregiver. If your blood pressure stays high after you have made lifestyle changes or started on medicines,   Your medication(s) may need to be changed.  Other problems may need to be addressed.  Be certain you understand your prescriptions, and know how and when to take your medicine.  Be sure to follow up with your caregiver within the time frame advised (usually within two weeks) to have your blood pressure rechecked and to review your medications.  If you are taking more than one medicine to lower your blood pressure, make sure you know how and at what times they should be taken. Taking two medicines at the same time can result in blood pressure that is too low. SEEK IMMEDIATE MEDICAL CARE IF:  You develop a severe headache, blurred or changing vision, or confusion.  You have unusual weakness or numbness, or a faint feeling.  You have severe chest or abdominal pain, vomiting, or breathing problems. MAKE SURE YOU:   Understand these instructions.  Will watch your condition.  Will get help right away if you are not doing well or get worse. Document Released: 11/01/2005 Document Revised: 01/24/2012 Document Reviewed: 06/21/2008 [facility] Patient Information 2014 [place], [place].

## 2013-09-25 NOTE — Assessment & Plan Note (Signed)
Labs ordered today. FLu shot given

## 2013-09-25 NOTE — Assessment & Plan Note (Signed)
Scheduled routine labs today. Otherwise chronic diseases are stable .

## 2013-09-25 NOTE — Assessment & Plan Note (Signed)
Presently seen at [facility] for care. Labs ordered today. Continue services at [facility]

## 2013-09-26 ENCOUNTER — Other Ambulatory Visit: Payer: Medicare Other

## 2013-09-26 DIAGNOSIS — I10 Essential (primary) hypertension: Secondary | ICD-10-CM

## 2013-09-26 DIAGNOSIS — Z0000 Encounter for general adult medical examination without abnormal findings: Secondary | ICD-10-CM

## 2013-09-26 DIAGNOSIS — E7800 Pure hypercholesterolemia, unspecified: Secondary | ICD-10-CM

## 2013-09-26 LAB — COMPREHENSIVE METABOLIC PANEL
ALT: 26 U/L (ref 0–53)
AST: 19 U/L (ref 0–37)
Albumin: 3.8 g/dL (ref 3.5–5.2)
CO2: 29 mEq/L (ref 19–32)
Calcium: 9.1 mg/dL (ref 8.4–10.5)
Chloride: 105 mEq/L (ref 96–112)
Creat: 1.04 mg/dL (ref 0.50–1.35)
Potassium: 3.9 mEq/L (ref 3.5–5.3)
Total Bilirubin: 0.3 mg/dL (ref 0.3–1.2)

## 2013-09-26 LAB — CBC WITH DIFFERENTIAL/PLATELET
Basophils Absolute: 0.1 10*3/uL (ref 0.0–0.1)
Basophils Relative: 1 % (ref 0–1)
Eosinophils Relative: 4 % (ref 0–5)
HCT: 38.6 % — ABNORMAL LOW (ref 39.0–52.0)
Lymphocytes Relative: 27 % (ref 12–46)
Lymphs Abs: 2.2 10*3/uL (ref 0.7–4.0)
MCH: 28.8 pg (ref 26.0–34.0)
MCV: 81.1 fL (ref 78.0–100.0)
Monocytes Absolute: 0.8 10*3/uL (ref 0.1–1.0)
Neutro Abs: 4.8 10*3/uL (ref 1.7–7.7)
Neutrophils Relative %: 58 % (ref 43–77)
Platelets: 412 10*3/uL — ABNORMAL HIGH (ref 150–400)
RBC: 4.76 MIL/uL (ref 4.22–5.81)
RDW: 13.4 % (ref 11.5–15.5)
WBC: 8.1 10*3/uL (ref 4.0–10.5)

## 2013-09-26 LAB — LIPID PANEL
Cholesterol: 158 mg/dL (ref 0–200)
Total CHOL/HDL Ratio: 5.6 Ratio

## 2013-09-26 NOTE — Progress Notes (Signed)
CBC WITH DIFF, CMP, FLP. Nikos Anglemyer,

## 2013-09-27 ENCOUNTER — Encounter: Payer: Self-pay | Admitting: Family Medicine

## 2013-10-04 ENCOUNTER — Other Ambulatory Visit: Payer: Self-pay | Admitting: Family Medicine

## 2013-10-09 ENCOUNTER — Encounter: Payer: Self-pay | Admitting: Neurology

## 2013-10-09 ENCOUNTER — Ambulatory Visit (INDEPENDENT_AMBULATORY_CARE_PROVIDER_SITE_OTHER): Payer: Medicare Other | Admitting: Neurology

## 2013-10-09 VITALS — BP 104/72 | HR 70 | Wt 221.0 lb

## 2013-10-09 DIAGNOSIS — R55 Syncope and collapse: Secondary | ICD-10-CM

## 2013-10-09 DIAGNOSIS — G40909 Epilepsy, unspecified, not intractable, without status epilepticus: Secondary | ICD-10-CM

## 2013-10-09 DIAGNOSIS — I951 Orthostatic hypotension: Secondary | ICD-10-CM

## 2013-10-09 HISTORY — DX: Orthostatic hypotension: I95.1

## 2013-10-09 MED ORDER — LEVETIRACETAM 500 MG PO TABS: 1000.0000 mg | ORAL_TABLET | Freq: Two times a day (BID) | ORAL | Status: DC

## 2013-10-09 NOTE — Patient Instructions (Signed)
Orthostatic Hypotension °Orthostatic hypotension is a sudden fall in blood pressure. It occurs when a person goes from a sitting or lying position to a standing position. °CAUSES  °· Loss of body fluids (dehydration). °· Medicines that lower blood pressure. °· Sudden changes in posture, such as sudden standing when you have been sitting or lying down. °· Taking too much of your medicine. °SYMPTOMS  °· Lightheadedness or dizziness. °· Fainting or near-fainting. °· A fast heart rate (tachycardia). °· Weakness. °· Feeling tired (fatigue). °DIAGNOSIS  °Your caregiver may find the cause of orthostatic hypotension through: °· A history and/or physical exam. °· Checking your blood pressure. Your caregiver will check your blood pressure when you are: °· Lying down. °· Sitting. °· Standing. °· Tilt table testing. In this test, you are placed on a table that goes from a lying position to a standing position. You will be strapped to the table. This test helps to monitor your blood pressure and heart rate when you are in different positions. °TREATMENT  °· If orthostatic hypotension is caused by your medicines, your caregiver will need to adjust your dosage. Do not stop or adjust your medicine on your own. °· When changing positions, make these changes slowly. This allows your body to adjust to the different position. °· Compression stockings that are worn on your lower legs may be helpful. °· Your caregiver may have you consume extra salt. Do not add extra salt to your diet unless directed by your caregiver. °· Eat frequent, small meals. Avoid sudden standing after eating. °· Avoid hot showers or excessive heat. °· Your caregiver may give you fluids through the vein (intravenous). °· Your caregiver may put you on medicine to help enhance fluid retention. °SEEK IMMEDIATE MEDICAL CARE IF:  °· You faint or have a near-fainting episode. Call your local emergency services (911 in [place] °· You have or develop chest pain. °· You  feel sick to your stomach (nauseous) or vomit. °· You have a loss of feeling or movement in your arms or legs. °· You have difficulty talking, slurred speech, or you are unable to talk. °· You have difficulty thinking or have confused thinking. °MAKE SURE YOU:  °· Understand these instructions. °· Will watch your condition. °· Will get help right away if you are not doing well or get worse. °Document Released: 10/22/2002 Document Revised: 01/24/2012 Document Reviewed: 02/14/2009 °ExitCare® Patient Information ©2014 [employer]. ° °

## 2013-10-09 NOTE — Progress Notes (Signed)
Reason for visit: History of seizures  Jose Li is an 58 y.o. male  History of present illness:  Jose Li is a 57 year old right-handed white male with a history of alcohol overuse in the past. The patient is no longer consuming alcohol. The patient has a history of bipolar disorder, and he is been treated with Risperdal and Cogentin. The patient has developed significant problems with orthostatic hypotension, and he continues to have episodes of dizziness with occasional blackout episodes. The patient last had a blackout last week. The patient can feel dizzy prior to the blackout. The patient has dizziness only with standing, not with sitting or lying down. The patient has been placed on midodrine in low dose taking 2.5 mg twice daily. This has not been completely effective. The patient indicates that he already sleeps with his head elevated about 30. The patient does not have compression stockings at this point. The patient returns to this office for an evaluation. The patient indicates that he has not had any further seizures, and that his migraine headaches are under good control.  Past Medical History  Diagnosis Date  . Allergy   . Anxiety   . Depression   . Hyperlipidemia   . Substance abuse   . Seizures     none for last 6-27months  . Hypertension, essential, benign 08/02/2012  . Orthostatic hypotension 10/09/2013    Past Surgical History  Procedure Laterality Date  . Fracture surgery      right hip and femur  . Joint replacement      right hip  . Tracheostomy  08/1999    Had pneumoniaand was in a coma 10 days  . Hernia repair  07/2009    umbilical hernia/ got infected    Family History  Problem Relation Age of Onset  . Heart disease Mother   . Diabetes Father   . Heart disease Brother     Social history:  reports that he has been smoking Cigarettes.  He has a 9 pack-year smoking history. He has never used smokeless tobacco. He reports that he does not drink  alcohol or use illicit drugs.    Allergies  Allergen Reactions  . Erythromycin   . Hctz [Hydrochlorothiazide]     "Makes him faint" per patient Dr. Tenny Craw ([place]) took him off  . Propranolol     "Makes him faint" per patient Dr. Tenny Craw ([place]) took him off      Medications:  Current Outpatient Prescriptions on File Prior to Visit  Medication Sig Dispense Refill  . aspirin 81 MG EC tablet Take 81 mg by mouth daily.        Marland Kitchen atorvastatin (LIPITOR) 20 MG tablet TAKE ONE TABLET BY MOUTH EVERY DAY  30 tablet  11  . benztropine (COGENTIN) 1 MG tablet Take 1 mg by mouth 2 (two) times daily.        . cetirizine (ZYRTEC) 10 MG tablet TAKE ONE TABLET BY MOUTH EVERY DAY  30 tablet  2  . DULoxetine (CYMBALTA) 60 MG capsule Take 60 mg by mouth daily. 20 MG added in the afternoons      . ibuprofen (ADVIL,MOTRIN) 600 MG tablet TAKE ONE TABLET BY MOUTH EVERY 6 HOURS AS NEEDED FOR HEADACHE  30 tablet  3  . LYRICA 75 MG capsule TAKE ONE CAPSULE BY MOUTH TWICE DAILY  60 capsule  3  . midodrine (PROAMATINE) 2.5 MG tablet Take 1 tablet (2.5 mg total) by mouth 2 (two) times daily.  At 8am and 2pm.  180 tablet  3  . pindolol (VISKEN) 5 MG tablet TAKE ONE-HALF TABLET BY MOUTH TWICE DAILY  30 tablet  1  . potassium chloride SA (K-DUR,KLOR-CON) 20 MEQ tablet Take 1 tablet (20 mEq total) by mouth daily.  90 tablet  3  . risperiDONE (RISPERDAL) 2 MG tablet Take 1/2 tab in the am, 1/2 tab at lunch, 2 tab at night      . thiamine 100 MG tablet Take 100 mg by mouth daily.        . traZODone (DESYREL) 100 MG tablet 150 mg at bedtime.        No current facility-administered medications on file prior to visit.    ROS:  Out of a complete 14 system review of symptoms, the patient complains only of the following symptoms, and all other reviewed systems are negative.  Weight gain, fatigue Headache, dizziness Depression, anxiety, insomnia, racing thoughts, sleepiness  Blood pressure 104/72, pulse 70, weight 221 lb  (100.245 kg).  Blood pressures, right arm, standing is 84/58. Blood pressure, right arm, sitting is 146/80.  Physical Exam  General: The patient is alert and cooperative at the time of the examination.  Skin: No significant peripheral edema is noted.   Neurologic Exam  Mental status: The patient is oriented x 3.  Cranial nerves: Facial symmetry is present. Speech is normal, no aphasia or dysarthria is noted. Extraocular movements are full. Visual fields are full.  Motor: The patient has good strength in all 4 extremities.  Sensory examination: Soft touch sensation on the face, arms, and legs is symmetric.  Coordination: The patient has good finger-nose-finger and heel-to-shin bilaterally.  Gait and station: The patient has a normal gait. Tandem gait is normal. Romberg is negative. No drift is seen.  Reflexes: Deep tendon reflexes are symmetric.   Assessment/Plan:  1. History seizures  2. History of migraine headache  3. Orthostatic hypotension  The patient may have an autonomic neuropathy causing his orthostasis. The patient does not report any bowel or bladder dysfunction or problems consistent with gastroparesis. The patient will be given a prescription for compression stockings, and the midodrine may need to be increased in the future. The patient will followup through this office in 6 months.  [name] MD 10/09/2013 7:00 PM  [facility] [address] [place], [place] [address]  Phone [phone] Fax [phone]

## 2013-11-19 DIAGNOSIS — F3132 Bipolar disorder, current episode depressed, moderate: Secondary | ICD-10-CM | POA: Diagnosis not present

## 2013-11-21 ENCOUNTER — Other Ambulatory Visit: Payer: Self-pay | Admitting: Internal Medicine

## 2013-11-22 ENCOUNTER — Ambulatory Visit: Payer: Self-pay | Admitting: Neurology

## 2013-12-03 ENCOUNTER — Encounter: Payer: Self-pay | Admitting: Internal Medicine

## 2013-12-03 ENCOUNTER — Ambulatory Visit (INDEPENDENT_AMBULATORY_CARE_PROVIDER_SITE_OTHER): Payer: Medicare Other | Admitting: Internal Medicine

## 2013-12-03 VITALS — BP 101/77 | HR 83 | Ht 72.0 in | Wt 223.0 lb

## 2013-12-03 DIAGNOSIS — R55 Syncope and collapse: Secondary | ICD-10-CM | POA: Diagnosis not present

## 2013-12-03 DIAGNOSIS — R42 Dizziness and giddiness: Secondary | ICD-10-CM

## 2013-12-03 LAB — URINALYSIS, ROUTINE W REFLEX MICROSCOPIC
BILIRUBIN URINE: NEGATIVE
HGB URINE DIPSTICK: NEGATIVE
KETONES UR: NEGATIVE
LEUKOCYTES UA: NEGATIVE
NITRITE: NEGATIVE
RBC / HPF: NONE SEEN (ref 0–?)
Specific Gravity, Urine: <=1.005 — AB (ref 1.000–1.030)
Total Protein, Urine: NEGATIVE
Urine Glucose: NEGATIVE
Urobilinogen, UA: 0.2 (ref 0.0–1.0)
WBC, UA: NONE SEEN (ref 0–?)
pH: 7 (ref 5.0–8.0)

## 2013-12-03 LAB — BASIC METABOLIC PANEL
BUN: 4 mg/dL — ABNORMAL LOW (ref 6–23)
CALCIUM: 9.1 mg/dL (ref 8.4–10.5)
CO2: 29 mEq/L (ref 19–32)
CREATININE: 1.1 mg/dL (ref 0.4–1.5)
Chloride: 103 mEq/L (ref 96–112)
GFR: 76.01 mL/min (ref >60.00)
Glucose, Bld: 83 mg/dL (ref 70–99)
Potassium: 4.5 mEq/L (ref 3.5–5.1)
Sodium: 139 mEq/L (ref 135–145)

## 2013-12-03 MED ORDER — MIDODRINE HCL 5 MG PO TABS: 5.0000 mg | ORAL_TABLET | Freq: Two times a day (BID) | ORAL | Status: DC

## 2013-12-03 NOTE — Patient Instructions (Addendum)
Your physician recommends that you return for lab work in: today bmet and urinalysis   Your physician has recommended you make the following change in your medication:  INCREASE MIDODRINE TO 5 MG TWICE DAILY   Your physician recommends that you schedule a follow-up appointment in: 6 WEEKS

## 2013-12-03 NOTE — Progress Notes (Signed)
HPI Patient is a 58 yo who has a history of dysautonomia. I saw him in August   He was seen by Dr Anne Hahn in  November  BP decreased wint standing  Told to wear support socks He still feels dizzy a lot.  Passed out a few days ago.  He was walking  Felt light headed  Sat down then passed out Ha some R sided Abdominal/chest pain. Allergies     Allergies  Allergen Reactions  . Erythromycin   . Hctz [Hydrochlorothiazide]     "Makes him faint" per patient Dr. Tenny Craw ([place]) took him off  . Propranolol     "Makes him faint" per patient Dr. Tenny Craw ([place]) took him off      Current Outpatient Prescriptions  Medication Sig Dispense Refill  . aspirin 81 MG EC tablet Take 81 mg by mouth daily.        Marland Kitchen atorvastatin (LIPITOR) 20 MG tablet TAKE ONE TABLET BY MOUTH EVERY DAY  30 tablet  11  . benztropine (COGENTIN) 1 MG tablet Take 1 mg by mouth 2 (two) times daily.        . cetirizine (ZYRTEC) 10 MG tablet TAKE ONE TABLET BY MOUTH EVERY DAY  30 tablet  2  . DULoxetine (CYMBALTA) 30 MG capsule Take 30 mg by mouth every evening. With Cymbalta 60 mg every morning.      . DULoxetine (CYMBALTA) 60 MG capsule Take 60 mg by mouth daily. 30 MG added in the afternoons      . ibuprofen (ADVIL,MOTRIN) 600 MG tablet TAKE ONE TABLET BY MOUTH EVERY 6 HOURS AS NEEDED FOR HEADACHE  30 tablet  3  . levETIRAcetam (KEPPRA) 500 MG tablet Take 2 tablets (1,000 mg total) by mouth 2 (two) times daily.  120 tablet  11  . LYRICA 75 MG capsule TAKE ONE CAPSULE BY MOUTH TWICE DAILY  60 capsule  3  . midodrine (PROAMATINE) 2.5 MG tablet Take 1 tablet (2.5 mg total) by mouth 2 (two) times daily. At 8am and 2pm.  180 tablet  3  . pindolol (VISKEN) 5 MG tablet TAKE ONE-HALF TABLET BY MOUTH TWICE DAILY  30 tablet  0  . potassium chloride SA (K-DUR,KLOR-CON) 20 MEQ tablet Take 1 tablet (20 mEq total) by mouth daily.  90 tablet  3  . risperiDONE (RISPERDAL) 2 MG tablet Take 1/2 tab in the am, 1/2 tab at lunch, 2 tab at night       . thiamine 100 MG tablet Take 100 mg by mouth daily.        . traZODone (DESYREL) 100 MG tablet 150 mg at bedtime.        No current facility-administered medications for this visit.    Past Medical History  Diagnosis Date  . Allergy   . Anxiety   . Depression   . Hyperlipidemia   . Substance abuse   . Seizures     none for last 6-64months  . Hypertension, essential, benign 08/02/2012  . Orthostatic hypotension 10/09/2013    Past Surgical History  Procedure Laterality Date  . Fracture surgery      right hip and femur  . Joint replacement      right hip  . Tracheostomy  08/1999    Had pneumoniaand was in a coma 10 days  . Hernia repair  07/2009    umbilical hernia/ got infected    Family History  Problem Relation Age of Onset  . Heart disease Mother   .  Diabetes Father   . Heart disease Brother     History   Social History  . Marital Status: Single    Spouse Name: N/A    Number of Children: N/A  . Years of Education: N/A   Occupational History  . Not on file.   Social History Main Topics  . Smoking status: Current Every Day Smoker -- 0.30 packs/day for 30 years    Types: Cigarettes  . Smokeless tobacco: Never Used  . Alcohol Use: No     Comment: history of alcohol abuse.  . Drug Use: No  . Sexual Activity: Not Currently   Other Topics Concern  . Not on file   Social History Narrative  . No narrative on file    Review of Systems:  All systems reviewed.  They are negative to the above problem except as previously stated.  Vital Signs: BP 136/83  Pulse 78  Ht 6' (1.829 m)  Wt 223 lb (101.152 kg)  BMI 30.24 kg/m2  Physical Exam Patinet is in NAD HEENT:  Normocephalic, atraumatic. EOMI, PERRLA.  Neck: JVP is normal.  No bruits.  Lungs: clear to auscultation. No rales no wheezes.  Heart: Regular rate and rhythm. Normal S1, S2. No S3.   No significant murmurs. PMI not displaced.  Abdomen:  Supple, nontender. Normal bowel sounds. No masses. No  hepatomegaly.  Extremities:   Good distal pulses throughout. No lower extremity edema.  Musculoskeletal :moving all extremities.  Neuro:   alert and oriented x3.  CN II-XII grossly intact.   Assessment and Plan:  1.  Dysautonomia.  Patient still symptomatic  I would increase proamatine to 5 2x per day  Continue to drink fluids, eat salt   F/U in 1 month  2.  Hypokalemia  Would check BMET today.  Taking K

## 2013-12-04 ENCOUNTER — Telehealth: Payer: Self-pay | Admitting: Internal Medicine

## 2013-12-04 NOTE — Telephone Encounter (Signed)
New message ° °Patient is returning your call, please call back. °

## 2013-12-05 NOTE — Telephone Encounter (Signed)
Spoke with pt, aware of lab results. 

## 2013-12-13 ENCOUNTER — Other Ambulatory Visit: Payer: Self-pay | Admitting: Neurology

## 2013-12-13 NOTE — Telephone Encounter (Signed)
The pharmacy sent a refill request for Keppra 750mg  (1,500mg  twice daily).  At last OV, an Rx for Keppra 500mg  was prescribed (1,000mg  twice daily).  I spoke with the patient who says he has been taking 750mg .  Okay to send refills on the 750mg  Rx?  Please advise.  Thank you.

## 2013-12-20 ENCOUNTER — Other Ambulatory Visit: Payer: Self-pay | Admitting: Internal Medicine

## 2013-12-27 ENCOUNTER — Other Ambulatory Visit: Payer: Self-pay | Admitting: Neurology

## 2013-12-27 ENCOUNTER — Other Ambulatory Visit: Payer: Self-pay | Admitting: Family Medicine

## 2013-12-27 NOTE — Telephone Encounter (Signed)
Rx signed and faxed.

## 2014-01-14 ENCOUNTER — Ambulatory Visit (INDEPENDENT_AMBULATORY_CARE_PROVIDER_SITE_OTHER): Payer: Medicare Other | Admitting: Internal Medicine

## 2014-01-14 ENCOUNTER — Encounter: Payer: Self-pay | Admitting: Internal Medicine

## 2014-01-14 VITALS — BP 102/72 | HR 96 | Ht 72.0 in | Wt 221.0 lb

## 2014-01-14 DIAGNOSIS — I951 Orthostatic hypotension: Secondary | ICD-10-CM

## 2014-01-14 MED ORDER — FLUDROCORTISONE ACETATE 0.1 MG PO TABS: ORAL_TABLET | ORAL | Status: DC

## 2014-01-14 MED ORDER — MIDODRINE HCL 5 MG PO TABS: ORAL_TABLET | ORAL | Status: DC

## 2014-01-14 NOTE — Progress Notes (Signed)
HPI Patient is a 58 yo who has a history of dysautonomia. I saw him in Jan At the time I saw him in Jan  Increased proamatine Still dizzy with occasional syncope with bending.  Allergies     Allergies  Allergen Reactions  . Erythromycin   . Hctz [Hydrochlorothiazide]     "Makes him faint" per patient Dr. Tenny Crawoss ([place]) took him off  . Propranolol     "Makes him faint" per patient Dr. Tenny Crawoss () took him off      Current Outpatient Prescriptions  Medication Sig Dispense Refill  . aspirin 81 MG EC tablet Take 81 mg by mouth daily.        Marland Kitchen. atorvastatin (LIPITOR) 20 MG tablet TAKE ONE TABLET BY MOUTH EVERY DAY  30 tablet  11  . benztropine (COGENTIN) 1 MG tablet Take 1 mg by mouth 2 (two) times daily.        . cetirizine (ZYRTEC) 10 MG tablet TAKE ONE TABLET BY MOUTH ONCE DAILY  30 tablet  0  . DULoxetine (CYMBALTA) 30 MG capsule Take 30 mg by mouth every evening. With Cymbalta 60 mg every morning.      . DULoxetine (CYMBALTA) 60 MG capsule Take 60 mg by mouth daily. 30 MG added in the afternoons      . ibuprofen (ADVIL,MOTRIN) 600 MG tablet TAKE ONE TABLET BY MOUTH EVERY 6 HOURS AS NEEDED FOR HEADACHE  30 tablet  3  . levETIRAcetam (KEPPRA) 750 MG tablet TAKE TWO TABLETS BY MOUTH TWICE DAILY.  360 tablet  3  . LYRICA 75 MG capsule TAKE ONE CAPSULE BY MOUTH TWICE DAILY  60 capsule  5  . midodrine (PROAMATINE) 5 MG tablet Take 1 tablet (5 mg total) by mouth 2 (two) times daily with a meal. At 8am and 2pm.  180 tablet  3  . pindolol (VISKEN) 5 MG tablet TAKE ONE-HALF TABLET BY MOUTH TWICE DAILY  30 tablet  0  . potassium chloride SA (K-DUR,KLOR-CON) 20 MEQ tablet Take 1 tablet (20 mEq total) by mouth daily.  90 tablet  3  . risperiDONE (RISPERDAL) 2 MG tablet Take 1/2 tab in the am, 1/2 tab at lunch, 2 tab at night      . thiamine 100 MG tablet Take 100 mg by mouth daily.        . traZODone (DESYREL) 100 MG tablet 150 mg at bedtime.        No current facility-administered  medications for this visit.    Past Medical History  Diagnosis Date  . Allergy   . Anxiety   . Depression   . Hyperlipidemia   . Substance abuse   . Seizures     none for last 6-927months  . Hypertension, essential, benign 08/02/2012  . Orthostatic hypotension 10/09/2013    Past Surgical History  Procedure Laterality Date  . Fracture surgery      right hip and femur  . Joint replacement      right hip  . Tracheostomy  08/1999    Had pneumoniaand was in a coma 10 days  . Hernia repair  07/2009    umbilical hernia/ got infected    Family History  Problem Relation Age of Onset  . Heart disease Mother   . Diabetes Father   . Heart disease Brother     History   Social History  . Marital Status: Single    Spouse Name: N/A    Number of Children: N/A  . Years  of Education: N/A   Occupational History  . Not on file.   Social History Main Topics  . Smoking status: Current Every Day Smoker -- 0.30 packs/day for 30 years    Types: Cigarettes  . Smokeless tobacco: Never Used  . Alcohol Use: No     Comment: history of alcohol abuse.  . Drug Use: No  . Sexual Activity: Not Currently   Other Topics Concern  . Not on file   Social History Narrative  . No narrative on file    Review of Systems:  All systems reviewed.  They are negative to the above problem except as previously stated.  Vital Signs: BP 102/72  Pulse 96  Ht 6' (1.829 m)  Wt 221 lb (100.245 kg)  BMI 29.97 kg/m2  Physical Exam Patinet is in NAD HEENT:  Normocephalic, atraumatic. EOMI, PERRLA.  Neck: JVP is normal.  No bruits.  Lungs: clear to auscultation. No rales no wheezes.  Heart: Regular rate and rhythm. Normal S1, S2. No S3.   No significant murmurs. PMI not displaced.  Abdomen:  Supple, nontender. Normal bowel sounds. No masses. No hepatomegaly.  Extremities:   Good distal pulses throughout. No lower extremity edema.  Musculoskeletal :moving all extremities.  Neuro:   alert and oriented  x3.  CN II-XII grossly intact.   Assessment and Plan:  1.  Dysautonomia.  Patient still symptomatic Continue proamitine  Add back florinef  0.05 bid  This is only med he really responded to  2.  Hypokalemia  Would check BMET today.  Taking K  Check in 10 days.

## 2014-01-14 NOTE — Patient Instructions (Signed)
Your physician has recommended you make the following change in your medication:   1. Start back taking Florinef 0.1 mg 1/2 tablet at 8 am and 1/2 tablet at 2 pm.  2. Take Midodrine 5 mg at 8 am and 2.5 mg at 2pm.  Your physician recommends that you return for lab work in 1 week on 01/21/14.  Your physician recommends that you schedule a follow-up appointment in: 4 weeks with Dr. Tenny Crawoss.

## 2014-01-17 ENCOUNTER — Other Ambulatory Visit: Payer: Self-pay | Admitting: Internal Medicine

## 2014-01-21 ENCOUNTER — Other Ambulatory Visit (INDEPENDENT_AMBULATORY_CARE_PROVIDER_SITE_OTHER): Payer: Medicare Other

## 2014-01-21 DIAGNOSIS — I951 Orthostatic hypotension: Secondary | ICD-10-CM

## 2014-01-21 LAB — BASIC METABOLIC PANEL
BUN: 4 mg/dL — ABNORMAL LOW (ref 6–23)
CALCIUM: 8.7 mg/dL (ref 8.4–10.5)
CO2: 26 meq/L (ref 19–32)
Chloride: 103 mEq/L (ref 96–112)
Creatinine, Ser: 1.1 mg/dL (ref 0.4–1.5)
GFR: 75.16 mL/min (ref >60.00)
Glucose, Bld: 156 mg/dL — ABNORMAL HIGH (ref 70–99)
POTASSIUM: 3.3 meq/L — AB (ref 3.5–5.1)
SODIUM: 138 meq/L (ref 135–145)

## 2014-01-23 ENCOUNTER — Telehealth: Payer: Self-pay | Admitting: Internal Medicine

## 2014-01-23 DIAGNOSIS — E876 Hypokalemia: Secondary | ICD-10-CM

## 2014-01-23 MED ORDER — POTASSIUM CHLORIDE CRYS ER 20 MEQ PO TBCR: 20.0000 meq | EXTENDED_RELEASE_TABLET | Freq: Two times a day (BID) | ORAL | Status: DC

## 2014-01-23 NOTE — Telephone Encounter (Signed)
Follow up      rtn call back to nurse  

## 2014-01-23 NOTE — Telephone Encounter (Signed)
Left message for pt to call.

## 2014-01-23 NOTE — Telephone Encounter (Signed)
Spoke with pt, aware of labs and Patient voiced understanding of med change.

## 2014-01-23 NOTE — Telephone Encounter (Signed)
Follow up         Pt would like blood test results from yesterday

## 2014-01-23 NOTE — Telephone Encounter (Signed)
Pt rtn call to deborah jones

## 2014-01-24 ENCOUNTER — Other Ambulatory Visit: Payer: Self-pay | Admitting: Family Medicine

## 2014-02-01 ENCOUNTER — Other Ambulatory Visit (INDEPENDENT_AMBULATORY_CARE_PROVIDER_SITE_OTHER): Payer: Medicare Other

## 2014-02-01 DIAGNOSIS — E876 Hypokalemia: Secondary | ICD-10-CM

## 2014-02-01 LAB — BASIC METABOLIC PANEL
BUN: 4 mg/dL — AB (ref 6–23)
CHLORIDE: 102 meq/L (ref 96–112)
CO2: 28 meq/L (ref 19–32)
Calcium: 8.7 mg/dL (ref 8.4–10.5)
Creatinine, Ser: 1.1 mg/dL (ref 0.4–1.5)
GFR: 76.79 mL/min (ref >60.00)
Glucose, Bld: 94 mg/dL (ref 70–99)
POTASSIUM: 3.9 meq/L (ref 3.5–5.1)
Sodium: 137 mEq/L (ref 135–145)

## 2014-02-04 ENCOUNTER — Other Ambulatory Visit: Payer: Medicare Other

## 2014-02-08 ENCOUNTER — Ambulatory Visit (INDEPENDENT_AMBULATORY_CARE_PROVIDER_SITE_OTHER): Payer: Medicare Other | Admitting: Internal Medicine

## 2014-02-08 ENCOUNTER — Encounter: Payer: Self-pay | Admitting: Internal Medicine

## 2014-02-08 VITALS — BP 134/82 | HR 97 | Ht 72.0 in | Wt 224.8 lb

## 2014-02-08 DIAGNOSIS — I951 Orthostatic hypotension: Secondary | ICD-10-CM | POA: Diagnosis not present

## 2014-02-08 NOTE — Patient Instructions (Signed)
Your physician wants you to follow-up in: 4 MONTHS WITH DR Tenny CrawOSS You will receive a reminder letter in the mail two months in advance. If you don't receive a letter, please call our office to schedule the follow-up appointment.   Your physician recommends that you return for lab work in:ONE MONTH

## 2014-02-08 NOTE — Progress Notes (Signed)
HPI Patient is a 58 yo who has a history of dysautonomia. I saw him in early march At the time I saw him I added back Florinef I placed him on K and his last K wsa OK   SInce seen has had only a couple dizzy spells Breahing is OK   Has had 2 episdoes of CP  Not with activty  Allergies     Allergies  Allergen Reactions  . Erythromycin   . Hctz [Hydrochlorothiazide]     "Makes him faint" per patient Dr. Tenny Craw ([place]) took him off  . Propranolol     "Makes him faint" per patient Dr. Tenny Craw ([place]) took him off      Current Outpatient Prescriptions  Medication Sig Dispense Refill  . aspirin 81 MG EC tablet Take 81 mg by mouth daily.        Marland Kitchen atorvastatin (LIPITOR) 20 MG tablet TAKE ONE TABLET BY MOUTH EVERY DAY  30 tablet  11  . benztropine (COGENTIN) 1 MG tablet Take 1 mg by mouth 2 (two) times daily.        . DULoxetine (CYMBALTA) 30 MG capsule Take 30 mg by mouth every evening. With Cymbalta 60 mg every morning.      . DULoxetine (CYMBALTA) 60 MG capsule Take 60 mg by mouth daily. 30 MG added in the afternoons      . EQ ALLERGY RELIEF, CETIRIZINE, 10 MG tablet TAKE ONE TABLET BY MOUTH ONCE DAILY  30 tablet  6  . fludrocortisone (FLORINEF) 0.1 MG tablet 1/2 tablet at 8 am and 1/2 tablet at 2 pm  30 tablet  6  . ibuprofen (ADVIL,MOTRIN) 600 MG tablet TAKE ONE TABLET BY MOUTH EVERY 6 HOURS AS NEEDED FOR HEADACHE  30 tablet  3  . levETIRAcetam (KEPPRA) 750 MG tablet TAKE TWO TABLETS BY MOUTH TWICE DAILY.  360 tablet  3  . LYRICA 75 MG capsule TAKE ONE CAPSULE BY MOUTH TWICE DAILY  60 capsule  5  . midodrine (PROAMATINE) 5 MG tablet 5 mg at 8 am and 2.5 mg at 2 pm  180 tablet  3  . pindolol (VISKEN) 5 MG tablet TAKE ONE-HALF TABLET BY MOUTH TWICE DAILY  30 tablet  0  . potassium chloride SA (K-DUR,KLOR-CON) 20 MEQ tablet Take 10 mEq by mouth 2 (two) times daily.      . risperiDONE (RISPERDAL) 2 MG tablet Take 1/2 tab in the am, 1/2 tab at lunch, 2 tab at night      . thiamine 100 MG  tablet Take 100 mg by mouth daily.        . traZODone (DESYREL) 100 MG tablet 150 mg at bedtime.        No current facility-administered medications for this visit.    Past Medical History  Diagnosis Date  . Allergy   . Anxiety   . Depression   . Hyperlipidemia   . Substance abuse   . Seizures     none for last 6-41months  . Hypertension, essential, benign 08/02/2012  . Orthostatic hypotension 10/09/2013    Past Surgical History  Procedure Laterality Date  . Fracture surgery      right hip and femur  . Joint replacement      right hip  . Tracheostomy  08/1999    Had pneumoniaand was in a coma 10 days  . Hernia repair  07/2009    umbilical hernia/ got infected    Family History  Problem Relation Age of Onset  .  Heart disease Mother   . Diabetes Father   . Heart disease Brother     History   Social History  . Marital Status: Single    Spouse Name: N/A    Number of Children: N/A  . Years of Education: N/A   Occupational History  . Not on file.   Social History Main Topics  . Smoking status: Current Every Day Smoker -- 0.30 packs/day for 30 years    Types: Cigarettes  . Smokeless tobacco: Never Used  . Alcohol Use: No     Comment: history of alcohol abuse.  . Drug Use: No  . Sexual Activity: Not Currently   Other Topics Concern  . Not on file   Social History Narrative  . No narrative on file    Review of Systems:  All systems reviewed.  They are negative to the above problem except as previously stated.  Vital Signs: BP 134/82  Pulse 97  Ht 6' (1.829 m)  Wt 224 lb 12.8 oz (101.969 kg)  BMI 30.48 kg/m2  SpO2 96%  Physical Exam Patinet is in NAD HEENT:  Normocephalic, atraumatic. EOMI, PERRLA.  Neck: JVP is normal.  No bruits.  Lungs: clear to auscultation. No rales no wheezes.  Heart: Regular rate and rhythm. Normal S1, S2. No S3.   No significant murmurs. PMI not displaced.  Abdomen:  Supple, nontender. Normal bowel sounds. No masses. No  hepatomegaly.  Extremities:   Good distal pulses throughout. No lower extremity edema.  Musculoskeletal :moving all extremities.  Neuro:   alert and oriented x3.  CN II-XII grossly intact.   Assessment and Plan:  1. Autonomic dysfunction.  Doing much better on current regimen.  I would keep this  Check BMET in 1 mon th track K F/U in July  2.  Tobacco  Counselled on stopping  Smokes about 1/2 ppd for 30 yers  Can't take meds  Not interested in classes.  3.  Seizures  F/U in neuro scheduled.    4.  HL  Keep on statin.

## 2014-02-11 DIAGNOSIS — F3132 Bipolar disorder, current episode depressed, moderate: Secondary | ICD-10-CM | POA: Diagnosis not present

## 2014-02-18 ENCOUNTER — Other Ambulatory Visit: Payer: Self-pay | Admitting: Internal Medicine

## 2014-03-25 ENCOUNTER — Encounter: Payer: Self-pay | Admitting: Neurology

## 2014-04-12 ENCOUNTER — Ambulatory Visit: Payer: Self-pay | Admitting: Neurology

## 2014-04-26 ENCOUNTER — Telehealth: Payer: Self-pay | Admitting: *Deleted

## 2014-04-26 NOTE — Telephone Encounter (Signed)
Left message to see if patient would be willing to see Aundra MilletMegan on Wed Per Willis.

## 2014-05-01 ENCOUNTER — Ambulatory Visit (INDEPENDENT_AMBULATORY_CARE_PROVIDER_SITE_OTHER): Payer: Medicare Other | Admitting: Neurology

## 2014-05-01 ENCOUNTER — Encounter: Payer: Self-pay | Admitting: Neurology

## 2014-05-01 VITALS — BP 140/70 | HR 72 | Wt 231.0 lb

## 2014-05-01 DIAGNOSIS — I951 Orthostatic hypotension: Secondary | ICD-10-CM

## 2014-05-01 DIAGNOSIS — G40909 Epilepsy, unspecified, not intractable, without status epilepticus: Secondary | ICD-10-CM | POA: Diagnosis not present

## 2014-05-01 NOTE — Progress Notes (Signed)
Reason for visit: Syncope, seizures  Jose Li is an 58 y.o. male  History of present illness:  Jose Li is a 58 year old right-handed white male with a history of prior alcohol abuse and a history of bipolar disorder. He has had some issues with orthostatic hypotension that may be related to an autonomic neuropathy associated with a prior alcohol abuse. The patient was recently started on Florinef which seems to help. The patient still has some issues with dizziness occasionally with standing, but he has not had a recent blackout events. The patient denies any seizure episodes since last seen. He remains on Keppra. He returns to this office for an evaluation.   Past Medical History  Diagnosis Date  . Allergy   . Anxiety   . Depression   . Hyperlipidemia   . Substance abuse   . Seizures     none for last 6-387months  . Hypertension, essential, benign 08/02/2012  . Orthostatic hypotension 10/09/2013    Past Surgical History  Procedure Laterality Date  . Fracture surgery      right hip and femur  . Joint replacement      right hip  . Tracheostomy  08/1999    Had pneumoniaand was in a coma 10 days  . Hernia repair  07/2009    umbilical hernia/ got infected    Family History  Problem Relation Age of Onset  . Heart disease Mother   . Diabetes Father   . Heart disease Brother     Social history:  reports that he has been smoking Cigarettes.  He has a 9 pack-year smoking history. He has never used smokeless tobacco. He reports that he does not drink alcohol or use illicit drugs.    Allergies  Allergen Reactions  . Erythromycin   . Hctz [Hydrochlorothiazide]     "Makes him faint" per patient Dr. Tenny Crawoss ([place]) took him off  . Propranolol     "Makes him faint" per patient Dr. Tenny Crawoss ([place]) took him off      Medications:  Current Outpatient Prescriptions on File Prior to Visit  Medication Sig Dispense Refill  . aspirin 81 MG EC tablet Take 81 mg by mouth daily.         Marland Kitchen. atorvastatin (LIPITOR) 20 MG tablet TAKE ONE TABLET BY MOUTH EVERY DAY  30 tablet  11  . benztropine (COGENTIN) 1 MG tablet Take 1 mg by mouth 2 (two) times daily.        . DULoxetine (CYMBALTA) 30 MG capsule Take 30 mg by mouth every evening. With Cymbalta 60 mg every morning.      . DULoxetine (CYMBALTA) 60 MG capsule Take 60 mg by mouth daily. 30 MG added in the afternoons      . EQ ALLERGY RELIEF, CETIRIZINE, 10 MG tablet TAKE ONE TABLET BY MOUTH ONCE DAILY  30 tablet  6  . fludrocortisone (FLORINEF) 0.1 MG tablet 1/2 tablet at 8 am and 1/2 tablet at 2 pm  30 tablet  6  . ibuprofen (ADVIL,MOTRIN) 600 MG tablet TAKE ONE TABLET BY MOUTH EVERY 6 HOURS AS NEEDED FOR HEADACHE  30 tablet  3  . levETIRAcetam (KEPPRA) 750 MG tablet TAKE TWO TABLETS BY MOUTH TWICE DAILY.  360 tablet  3  . LYRICA 75 MG capsule TAKE ONE CAPSULE BY MOUTH TWICE DAILY  60 capsule  5  . midodrine (PROAMATINE) 5 MG tablet 5 mg at 8 am and 2.5 mg at 2 pm  180 tablet  3  . pindolol (VISKEN) 5 MG tablet TAKE ONE-HALF TABLET BY MOUTH TWICE DAILY.  30 tablet  6  . potassium chloride SA (K-DUR,KLOR-CON) 20 MEQ tablet Take 10 mEq by mouth 2 (two) times daily.      . risperiDONE (RISPERDAL) 2 MG tablet Take 1/2 tab in the am, 1/2 tab at lunch, 2 tab at night      . thiamine 100 MG tablet Take 100 mg by mouth daily.        . traZODone (DESYREL) 100 MG tablet 150 mg at bedtime.        No current facility-administered medications on file prior to visit.    ROS:  Out of a complete 14 system review of symptoms, the patient complains only of the following symptoms, and all other reviewed systems are negative.  Fatigue  Ringing in the ears Drooling Shortness of breath, chest tightness Excessive thirst Restless legs, insomnia, snoring Muscle cramps Memory loss Depression, anxiety, hyperactivity  Blood pressure 140/70, pulse 72, weight 231 lb (104.781 kg).  Blood pressure, right arm, standing is 116/68. Blood pressure,  sitting, right arm is 144/86.  Physical Exam  General: The patient is alert and cooperative at the time of the examination.  Skin: 1+ edema of ankles is noted bilaterally.    Neurologic Exam  Mental status: The patient is oriented x 3.  Cranial nerves: Facial symmetry is present. Speech is normal, no aphasia or dysarthria is noted. Extraocular movements are full. Visual fields are full.  Motor: The patient has good strength in all 4 extremities.  Sensory examination: Soft touch sensation is symmetric on the face, arms, and legs.   Coordination: The patient has good finger-nose-finger and heel-to-shin bilaterally.  Gait and station: The patient has a normal gait. Tandem gait is slightly unsteady.  Romberg is negative. No drift is seen.  Reflexes: Deep tendon reflexes are symmetric.   Assessment/Plan:   One. History seizures  2. Orthostatic hypotension  3. History of prior alcohol abuse  Or. Bipolar disorder  The blood pressure issues have improved with on Florinef, with a reduction in the orthostatic hypotension symptoms. The patient will remain on the Keppra, and he will followup in about 6 months. Currently, the patient does not operate a motor vehicle.   [name]. Keith Willis MD 05/01/2014 4:45 PM  [facility] [address] [place], [place] [id number]  Phone [phone] Fax [phone]

## 2014-05-01 NOTE — Patient Instructions (Signed)

## 2014-05-06 DIAGNOSIS — F3132 Bipolar disorder, current episode depressed, moderate: Secondary | ICD-10-CM | POA: Diagnosis not present

## 2014-06-13 ENCOUNTER — Other Ambulatory Visit: Payer: Self-pay | Admitting: Family Medicine

## 2014-06-19 ENCOUNTER — Encounter: Payer: Self-pay | Admitting: Internal Medicine

## 2014-06-27 ENCOUNTER — Other Ambulatory Visit: Payer: Self-pay | Admitting: Neurology

## 2014-06-27 NOTE — Telephone Encounter (Signed)
Rx signed and faxed.

## 2014-06-27 NOTE — Telephone Encounter (Signed)
Dr Willis is out of the office.  Forwarding request to [facility] for approval.  

## 2014-07-05 ENCOUNTER — Ambulatory Visit: Payer: Medicare Other | Admitting: Internal Medicine

## 2014-07-26 ENCOUNTER — Ambulatory Visit: Payer: Medicare Other | Admitting: Internal Medicine

## 2014-08-01 NOTE — Progress Notes (Signed)
HPI Patient is a 58 yo who has a history of dysautonomia.He has done well on Florinef but had problems with low K in past  Pateint was last seen in March Since seen he has done OK  Denies signif dizziness  Stays busy. No syncope  Allergies     Allergies  Allergen Reactions  . Erythromycin   . Hctz [Hydrochlorothiazide]     "Makes him faint" per patient Dr. Tenny Craw ([place]) took him off  . Propranolol     "Makes him faint" per patient Dr. Tenny Craw ([place]) took him off      Current Outpatient Prescriptions  Medication Sig Dispense Refill  . aspirin 81 MG EC tablet Take 81 mg by mouth daily.        Marland Kitchen atorvastatin (LIPITOR) 20 MG tablet TAKE ONE TABLET BY MOUTH EVERY DAY  30 tablet  11  . benztropine (COGENTIN) 1 MG tablet Take 1 mg by mouth 2 (two) times daily.        . DULoxetine (CYMBALTA) 30 MG capsule Take 30 mg by mouth every evening. With Cymbalta 60 mg every morning.      . DULoxetine (CYMBALTA) 60 MG capsule Take 60 mg by mouth daily. 30 MG added in the afternoons      . EQ ALLERGY RELIEF, CETIRIZINE, 10 MG tablet TAKE ONE TABLET BY MOUTH ONCE DAILY  30 tablet  6  . fludrocortisone (FLORINEF) 0.1 MG tablet 1/2 tablet at 8 am and 1/2 tablet at 2 pm  30 tablet  6  . ibuprofen (ADVIL,MOTRIN) 600 MG tablet TAKE ONE TABLET BY MOUTH EVERY 6 HOURS AS NEEDED FOR HEADACHE  30 tablet  3  . levETIRAcetam (KEPPRA) 750 MG tablet TAKE TWO TABLETS BY MOUTH TWICE DAILY.  360 tablet  3  . LYRICA 75 MG capsule TAKE ONE CAPSULE BY MOUTH TWICE DAILY  60 capsule  5  . midodrine (PROAMATINE) 5 MG tablet 5 mg at 8 am and 2.5 mg at 2 pm  180 tablet  3  . pindolol (VISKEN) 5 MG tablet TAKE ONE-HALF TABLET BY MOUTH TWICE DAILY  30 tablet  0  . potassium chloride SA (K-DUR,KLOR-CON) 20 MEQ tablet Take 10 mEq by mouth 2 (two) times daily.      . risperiDONE (RISPERDAL) 2 MG tablet Take 1/2 tab in the am, 1/2 tab at lunch, 2 tab at night      . thiamine 100 MG tablet Take 100 mg by mouth daily.        .  traZODone (DESYREL) 100 MG tablet 150 mg at bedtime.        No current facility-administered medications for this visit.    Past Medical History  Diagnosis Date  . Allergy   . Anxiety   . Depression   . Hyperlipidemia   . Substance abuse   . Seizures     none for last 6-33months  . Hypertension, essential, benign 08/02/2012  . Orthostatic hypotension 10/09/2013    Past Surgical History  Procedure Laterality Date  . Fracture surgery      right hip and femur  . Joint replacement      right hip  . Tracheostomy  08/1999    Had pneumoniaand was in a coma 10 days  . Hernia repair  07/2009    umbilical hernia/ got infected    Family History  Problem Relation Age of Onset  . Heart disease Mother   . Diabetes Father   . Heart disease Brother  History   Social History  . Marital Status: Single    Spouse Name: N/A    Number of Children: N/A  . Years of Education: N/A   Occupational History  . Not on file.   Social History Main Topics  . Smoking status: Current Every Day Smoker -- 0.30 packs/day for 30 years    Types: Cigarettes  . Smokeless tobacco: Never Used  . Alcohol Use: No     Comment: history of alcohol abuse.  . Drug Use: No  . Sexual Activity: Not Currently   Other Topics Concern  . Not on file   Social History Narrative  . No narrative on file    Review of Systems:  All systems reviewed.  They are negative to the above problem except as previously stated.  Vital Signs: BP 134/82  Pulse 97  Ht 6' (1.829 m)  Wt 224 lb 12.8 oz (101.969 kg)  BMI 30.48 kg/m2  SpO2 96%  Physical Exam Patinet is in NAD HEENT:  Normocephalic, atraumatic. EOMI, PERRLA.  Neck: JVP is normal.  No bruits.  Lungs: clear to auscultation. No rales no wheezes.  Heart: Regular rate and rhythm. Normal S1, S2. No S3.   No significant murmurs. PMI not displaced.  Abdomen:  Supple, nontender. Normal bowel sounds. No masses. No hepatomegaly.  Extremities:   Good distal pulses  throughout. No lower extremity edema.  Musculoskeletal :moving all extremities.  Neuro:   alert and oriented x3.  CN II-XII grossly intact.   Assessment and Plan:  1. Autonomic dysfunction. Doing pretty good.  Taper midodrine.Check labs    2.   Seizures  Followed by neuro  No spells  4.  HL  Keep on statin.

## 2014-08-02 ENCOUNTER — Encounter: Payer: Self-pay | Admitting: Internal Medicine

## 2014-08-02 ENCOUNTER — Ambulatory Visit (INDEPENDENT_AMBULATORY_CARE_PROVIDER_SITE_OTHER): Payer: Medicare Other | Admitting: Internal Medicine

## 2014-08-02 VITALS — BP 130/72 | HR 104 | Ht 72.0 in | Wt 229.4 lb

## 2014-08-02 DIAGNOSIS — Z79899 Other long term (current) drug therapy: Secondary | ICD-10-CM

## 2014-08-02 LAB — BASIC METABOLIC PANEL
BUN: 4 mg/dL — ABNORMAL LOW (ref 6–23)
CHLORIDE: 102 meq/L (ref 96–112)
CO2: 21 meq/L (ref 19–32)
Calcium: 8.8 mg/dL (ref 8.4–10.5)
Creatinine, Ser: 1.2 mg/dL (ref 0.4–1.5)
GFR: 69.77 mL/min (ref >60.00)
Glucose, Bld: 192 mg/dL — ABNORMAL HIGH (ref 70–99)
POTASSIUM: 3 meq/L — AB (ref 3.5–5.1)
SODIUM: 135 meq/L (ref 135–145)

## 2014-08-02 MED ORDER — MIDODRINE HCL 5 MG PO TABS: ORAL_TABLET | ORAL | Status: DC

## 2014-08-02 NOTE — Patient Instructions (Signed)
Your physician has recommended you make the following change in your medication:  1.) decrease midodrine to 2.5 mg twice daily Your physician recommends that you return for lab work today [employer]) Your physician wants you to follow-up in: 6 months with Dr. Tenny Craw.  You will receive a reminder letter in the mail two months in advance. If you don't receive a letter, please call our office to schedule the follow-up appointment.

## 2014-08-06 ENCOUNTER — Telehealth: Payer: Self-pay | Admitting: Internal Medicine

## 2014-08-06 ENCOUNTER — Other Ambulatory Visit: Payer: Self-pay | Admitting: *Deleted

## 2014-08-06 DIAGNOSIS — I159 Secondary hypertension, unspecified: Secondary | ICD-10-CM

## 2014-08-06 NOTE — Telephone Encounter (Signed)
Notified of lab results.  Verbalizes understanding.  Will get repeat labs 10/2.

## 2014-08-06 NOTE — Telephone Encounter (Signed)
New message        Pt returning nurses call about results

## 2014-08-06 NOTE — Telephone Encounter (Signed)
Follow up  ° ° ° °Returning call back to nurse  °

## 2014-08-14 DIAGNOSIS — F3132 Bipolar disorder, current episode depressed, moderate: Secondary | ICD-10-CM | POA: Diagnosis not present

## 2014-08-16 ENCOUNTER — Other Ambulatory Visit (INDEPENDENT_AMBULATORY_CARE_PROVIDER_SITE_OTHER): Payer: Medicare Other | Admitting: *Deleted

## 2014-08-16 DIAGNOSIS — I951 Orthostatic hypotension: Secondary | ICD-10-CM

## 2014-08-16 DIAGNOSIS — I159 Secondary hypertension, unspecified: Secondary | ICD-10-CM

## 2014-08-16 LAB — BASIC METABOLIC PANEL
BUN: 4 mg/dL — AB (ref 6–23)
CHLORIDE: 101 meq/L (ref 96–112)
CO2: 29 mEq/L (ref 19–32)
Calcium: 8.6 mg/dL (ref 8.4–10.5)
Creatinine, Ser: 1 mg/dL (ref 0.4–1.5)
GFR: 82.93 mL/min (ref >60.00)
GLUCOSE: 205 mg/dL — AB (ref 70–99)
POTASSIUM: 3.2 meq/L — AB (ref 3.5–5.1)
SODIUM: 137 meq/L (ref 135–145)

## 2014-08-16 LAB — MAGNESIUM: Magnesium: 1.7 mg/dL (ref 1.5–2.5)

## 2014-08-20 ENCOUNTER — Telehealth: Payer: Self-pay | Admitting: Internal Medicine

## 2014-08-20 DIAGNOSIS — E876 Hypokalemia: Secondary | ICD-10-CM

## 2014-08-20 NOTE — Telephone Encounter (Signed)
Voicemail stating the person I've dialed cant take my call now.  lmtcb

## 2014-08-20 NOTE — Telephone Encounter (Signed)
lmtcb on mobile

## 2014-08-20 NOTE — Telephone Encounter (Signed)
Patient is returning your call. Please call back.  °

## 2014-08-21 ENCOUNTER — Other Ambulatory Visit: Payer: Self-pay | Admitting: Family Medicine

## 2014-08-21 MED ORDER — POTASSIUM CHLORIDE CRYS ER 20 MEQ PO TBCR: EXTENDED_RELEASE_TABLET | ORAL | Status: DC

## 2014-08-21 NOTE — Telephone Encounter (Signed)
Follow up ° ° ° ° °Want lab results °

## 2014-08-21 NOTE — Telephone Encounter (Signed)
Advised patient of lab results  

## 2014-08-21 NOTE — Telephone Encounter (Signed)
Message copied by Burnell BlanksPRATT, Deborra Phegley B on Wed Aug 21, 2014  9:08 AM ------      Message from: Dietrich PatesOSS, PAULA V      Created: Sun Aug 18, 2014 10:25 PM       Is he feeling OK or dizzye      I would increase K to 2 in AM, 1 in PM  K is still a little low. ------

## 2014-08-21 NOTE — Telephone Encounter (Signed)
Patient stated he felt fine, denied any dizziness

## 2014-09-02 ENCOUNTER — Other Ambulatory Visit (INDEPENDENT_AMBULATORY_CARE_PROVIDER_SITE_OTHER): Payer: Medicare Other | Admitting: *Deleted

## 2014-09-02 DIAGNOSIS — E876 Hypokalemia: Secondary | ICD-10-CM | POA: Diagnosis not present

## 2014-09-02 LAB — BASIC METABOLIC PANEL
BUN: 4 mg/dL — AB (ref 6–23)
CALCIUM: 8.6 mg/dL (ref 8.4–10.5)
CO2: 26 mEq/L (ref 19–32)
CREATININE: 1 mg/dL (ref 0.4–1.5)
Chloride: 99 mEq/L (ref 96–112)
GFR: 79.21 mL/min (ref >60.00)
Glucose, Bld: 128 mg/dL — ABNORMAL HIGH (ref 70–99)
Potassium: 3.6 mEq/L (ref 3.5–5.1)
Sodium: 138 mEq/L (ref 135–145)

## 2014-09-06 ENCOUNTER — Telehealth: Payer: Self-pay | Admitting: Internal Medicine

## 2014-09-06 NOTE — Telephone Encounter (Signed)
New problem ° ° °Pt returning your call. Please call pt. °

## 2014-09-09 NOTE — Telephone Encounter (Signed)
Follow up ° ° ° ° °Want lab results °

## 2014-09-09 NOTE — Telephone Encounter (Signed)
Patient informed of labs. Apparently unable to see it in my chart.

## 2014-09-12 ENCOUNTER — Other Ambulatory Visit: Payer: Self-pay | Admitting: Internal Medicine

## 2014-09-13 ENCOUNTER — Ambulatory Visit (INDEPENDENT_AMBULATORY_CARE_PROVIDER_SITE_OTHER): Payer: Medicare Other | Admitting: *Deleted

## 2014-09-13 ENCOUNTER — Ambulatory Visit: Payer: Medicare Other

## 2014-09-13 DIAGNOSIS — Z23 Encounter for immunization: Secondary | ICD-10-CM

## 2014-09-19 ENCOUNTER — Encounter: Payer: Self-pay | Admitting: *Deleted

## 2014-10-24 ENCOUNTER — Other Ambulatory Visit: Payer: Self-pay | Admitting: Neurology

## 2014-10-25 NOTE — Telephone Encounter (Signed)
Rx signed and faxed.

## 2014-10-29 ENCOUNTER — Ambulatory Visit (INDEPENDENT_AMBULATORY_CARE_PROVIDER_SITE_OTHER): Payer: Medicare Other | Admitting: Neurology

## 2014-10-29 ENCOUNTER — Encounter: Payer: Self-pay | Admitting: Neurology

## 2014-10-29 VITALS — BP 134/82 | HR 81 | Ht 72.0 in | Wt 231.0 lb

## 2014-10-29 DIAGNOSIS — I951 Orthostatic hypotension: Secondary | ICD-10-CM

## 2014-10-29 DIAGNOSIS — G40909 Epilepsy, unspecified, not intractable, without status epilepticus: Secondary | ICD-10-CM

## 2014-10-29 MED ORDER — LEVETIRACETAM 750 MG PO TABS: ORAL_TABLET | ORAL | Status: DC

## 2014-10-29 NOTE — Patient Instructions (Signed)

## 2014-10-29 NOTE — Progress Notes (Signed)
Reason for visit: Seizures  Jose Li is an 58 y.o. male  History of present illness:  Mr. Jose Li is a 58 year old right-handed white male with a history of alcohol abuse and a history of seizures. The patient also has orthostatic hypotension, treated with Florinef. The patient is on Keppra taking the 750 mg tablets, 2 tablets twice daily. The patient has not had any new medical issues that have come up since last seen. The patient is tolerating the Keppra quite well. He has not had any blackouts associated with orthostatic hypotension. He is not operating a motor vehicle.  Past Medical History  Diagnosis Date  . Allergy   . Anxiety   . Depression   . Hyperlipidemia   . Substance abuse   . Seizures     none for last 6-717months  . Hypertension, essential, benign 08/02/2012  . Orthostatic hypotension 10/09/2013    Past Surgical History  Procedure Laterality Date  . Fracture surgery      right hip and femur  . Joint replacement      right hip  . Tracheostomy  08/1999    Had pneumoniaand was in a coma 10 days  . Hernia repair  07/2009    umbilical hernia/ got infected    Family History  Problem Relation Age of Onset  . Heart disease Mother   . Diabetes Father   . Heart disease Brother     Social history:  reports that he has been smoking Cigarettes.  He has a 9 pack-year smoking history. He has never used smokeless tobacco. He reports that he does not drink alcohol or use illicit drugs.    Allergies  Allergen Reactions  . Erythromycin   . Hctz [Hydrochlorothiazide]     "Makes him faint" per patient Dr. Tenny Crawoss ([place]) took him off  . Propranolol     "Makes him faint" per patient Dr. Tenny Crawoss ([place]) took him off      Medications:  Current Outpatient Prescriptions on File Prior to Visit  Medication Sig Dispense Refill  . aspirin 81 MG EC tablet Take 81 mg by mouth daily.      Marland Kitchen. atorvastatin (LIPITOR) 20 MG tablet TAKE ONE TABLET BY MOUTH ONCE DAILY. 30 tablet 10   . benztropine (COGENTIN) 1 MG tablet Take 1 mg by mouth 2 (two) times daily.      . cetirizine (ZYRTEC) 10 MG tablet TAKE ONE TABLET BY MOUTH ONCE DAILY 30 tablet 9  . DULoxetine (CYMBALTA) 30 MG capsule Take 30 mg by mouth every evening. With Cymbalta 60 mg every morning.    . DULoxetine (CYMBALTA) 60 MG capsule Take 60 mg by mouth daily. 30 MG added in the afternoons    . fludrocortisone (FLORINEF) 0.1 MG tablet daily. 1/2 tablet at 8 am and 1/2 tablet at 2 pm    . ibuprofen (ADVIL,MOTRIN) 600 MG tablet TAKE ONE TABLET BY MOUTH EVERY 6 HOURS AS NEEDED FOR HEADACHE 30 tablet 3  . LYRICA 75 MG capsule TAKE ONE CAPSULE BY MOUTH TWICE DAILY. 60 capsule 5  . midodrine (PROAMATINE) 5 MG tablet 2.5 mg at 8 am and 2.5 mg at 2 pm 180 tablet 3  . pindolol (VISKEN) 5 MG tablet TAKE ONE-HALF TABLET BY MOUTH TWICE DAILY 30 tablet 5  . potassium chloride SA (K-DUR,KLOR-CON) 20 MEQ tablet 2 tablets in the morning and 1 tablet in the evening. 270 tablet 3  . risperiDONE (RISPERDAL) 2 MG tablet Take 1/2 tab  in the am, 1/2 tab at lunch, 2 tab at night    . thiamine 100 MG tablet Take 100 mg by mouth daily.      . traZODone (DESYREL) 100 MG tablet 150 mg at bedtime.      No current facility-administered medications on file prior to visit.    ROS:  Out of a complete 14 system review of symptoms, the patient complains only of the following symptoms, and all other reviewed systems are negative.  Dizziness  Blood pressure 134/82, pulse 81, height 6' (1.829 m), weight 231 lb (104.781 kg).   Blood pressure, right arm, sitting is 152/86. Blood pressure, standing, right arm is 120/80.  Physical Exam  General: The patient is alert and cooperative at the time of the examination. The patient is minimally obese.  Skin: No significant peripheral edema is noted.   Neurologic Exam  Mental status: The patient is oriented x 3.  Cranial nerves: Facial symmetry is present. Speech is normal, no aphasia or  dysarthria is noted. Extraocular movements are full. Visual fields are full.  Motor: The patient has good strength in all 4 extremities.  Sensory examination:  Soft touch sensation is symmetric on the face, arms, and legs.  Coordination: The patient has good finger-nose-finger and heel-to-shin bilaterally.  Gait and station: The patient has a normal gait. Tandem gait is minimally unsteady. Romberg is negative. No drift is seen.  Reflexes: Deep tendon reflexes are symmetric.   Assessment/Plan:  1. History of seizures  2. Orthostatic hypotension  The patient is doing well at this time. He will continue the Keppra, and he will follow-up in one year. The patient is to contact our office if any problems arise.  Jose Li. Jose Mikal Wisman MD 10/29/2014 7:11 PM  [facility] [address] [place], [place] [address]  Phone [phone] Fax [phone]

## 2014-10-31 ENCOUNTER — Ambulatory Visit: Payer: Self-pay | Admitting: Neurology

## 2014-12-25 ENCOUNTER — Encounter: Payer: Self-pay | Admitting: Internal Medicine

## 2015-01-31 ENCOUNTER — Ambulatory Visit (INDEPENDENT_AMBULATORY_CARE_PROVIDER_SITE_OTHER): Payer: Medicare Other | Admitting: Internal Medicine

## 2015-01-31 ENCOUNTER — Other Ambulatory Visit: Payer: Self-pay | Admitting: Internal Medicine

## 2015-01-31 ENCOUNTER — Encounter: Payer: Self-pay | Admitting: Internal Medicine

## 2015-01-31 VITALS — BP 134/86 | HR 80 | Ht 72.0 in | Wt 234.0 lb

## 2015-01-31 DIAGNOSIS — R0683 Snoring: Secondary | ICD-10-CM | POA: Diagnosis not present

## 2015-01-31 DIAGNOSIS — G4710 Hypersomnia, unspecified: Secondary | ICD-10-CM

## 2015-01-31 DIAGNOSIS — R400 Somnolence: Secondary | ICD-10-CM

## 2015-01-31 LAB — BASIC METABOLIC PANEL
BUN: 3 mg/dL — ABNORMAL LOW (ref 6–23)
CO2: 31 mEq/L (ref 19–32)
Calcium: 8.7 mg/dL (ref 8.4–10.5)
Chloride: 102 mEq/L (ref 96–112)
Creatinine, Ser: 1.01 mg/dL (ref 0.40–1.50)
GFR: 80.9 mL/min (ref >60.00)
GLUCOSE: 144 mg/dL — AB (ref 70–99)
POTASSIUM: 3.5 meq/L (ref 3.5–5.1)
SODIUM: 136 meq/L (ref 135–145)

## 2015-01-31 NOTE — Progress Notes (Signed)
Cardiology Office Note   Date:  01/31/2015   ID:  Jose Li, DOB 31-Jan-1956, MRN [id number]  PCP:  Felix PaciniKuneff, Renee, DO  Cardiologist:   Dietrich PatesPaula Glendora Clouatre, MD   No chief complaint on file. returns for f/u of BP/HR     History of Present Illness: Jose HoopsGary D Li is a 59 y.o. male with a history of autonomic dysfunction  I saw him last fall Since seen he has done OK  Notes some dizzines  No syncope   Does snore a lot Says he is drinking fluids, eating salt    Current Outpatient Prescriptions  Medication Sig Dispense Refill  . aspirin 81 MG EC tablet Take 81 mg by mouth daily.      Marland Kitchen. atorvastatin (LIPITOR) 20 MG tablet TAKE ONE TABLET BY MOUTH ONCE DAILY. 30 tablet 10  . benztropine (COGENTIN) 1 MG tablet Take 1 mg by mouth 2 (two) times daily.      . cetirizine (ZYRTEC) 10 MG tablet TAKE ONE TABLET BY MOUTH ONCE DAILY 30 tablet 9  . DULoxetine (CYMBALTA) 30 MG capsule Take 30 mg by mouth every evening. With Cymbalta 60 mg every morning.    . DULoxetine (CYMBALTA) 60 MG capsule Take 60 mg by mouth daily. 30 MG added in the afternoons    . fludrocortisone (FLORINEF) 0.1 MG tablet daily. 1/2 tablet at 8 am and 1/2 tablet at 2 pm    . ibuprofen (ADVIL,MOTRIN) 600 MG tablet TAKE ONE TABLET BY MOUTH EVERY 6 HOURS AS NEEDED FOR HEADACHE 30 tablet 3  . levETIRAcetam (KEPPRA) 750 MG tablet TAKE TWO TABLETS BY MOUTH TWICE DAILY. 360 tablet 3  . LYRICA 75 MG capsule TAKE ONE CAPSULE BY MOUTH TWICE DAILY. 60 capsule 5  . midodrine (PROAMATINE) 5 MG tablet 2.5 mg at 8 am and 2.5 mg at 2 pm 180 tablet 3  . pindolol (VISKEN) 5 MG tablet TAKE ONE-HALF TABLET BY MOUTH TWICE DAILY 30 tablet 5  . potassium chloride SA (K-DUR,KLOR-CON) 20 MEQ tablet 2 tablets in the morning and 1 tablet in the evening. 270 tablet 3  . risperiDONE (RISPERDAL) 2 MG tablet Take 1/2 tab in the am, 1/2 tab at lunch, 2 tab at night    . thiamine 100 MG tablet Take 100 mg by mouth daily.      . traZODone (DESYREL) 100 MG tablet  150 mg at bedtime.      No current facility-administered medications for this visit.    Allergies:   Erythromycin; Hctz; and Propranolol   Past Medical History  Diagnosis Date  . Allergy   . Anxiety   . Depression   . Hyperlipidemia   . Substance abuse   . Seizures     none for last 6-447months  . Hypertension, essential, benign 08/02/2012  . Orthostatic hypotension 10/09/2013    Past Surgical History  Procedure Laterality Date  . Fracture surgery      right hip and femur  . Joint replacement      right hip  . Tracheostomy  08/1999    Had pneumoniaand was in a coma 10 days  . Hernia repair  07/2009    umbilical hernia/ got infected     Social History:  The patient  reports that he has been smoking Cigarettes.  He has a 9 pack-year smoking history. He has never used smokeless tobacco. He reports that he does not drink alcohol or use illicit drugs.   Family History:  The patient's family history  includes Diabetes in his father; Heart disease in his brother and mother.    ROS:  Please see the history of present illness. All other systems are reviewed and  Negative to the above problem except as noted.    PHYSICAL EXAM: VS:  BP 134/86 mmHg  Pulse 80  Ht 6' (1.829 m)  Wt 234 lb (106.142 kg)  BMI 31.73 kg/m2  GEN: Well nourished, well developed, in no acute distress HEENT: normal Neck: no JVD, carotid bruits, or masses Cardiac: RRR; no murmurs, rubs, or gallops,no edema  Respiratory:  clear to auscultation bilaterally, normal work of breathing GI: soft, nontender, nondistended, + BS  No hepatomegaly  MS: no deformity Moving all extremities   Skin: warm and dry, no rash Neuro:  Strength and sensation are intact Psych: euthymic mood, full affect   EKG:  EKG is ordered today.  SR 80 bpm     Lipid Panel    Component Value Date/Time   CHOL 158 09/26/2013 0840   TRIG 379* 09/26/2013 0840   HDL 28* 09/26/2013 0840   CHOLHDL 5.6 09/26/2013 0840   VLDL 76* 09/26/2013  0840   LDLCALC 54 09/26/2013 0840      Wt Readings from Last 3 Encounters:  01/31/15 234 lb (106.142 kg)  10/29/14 231 lb (104.781 kg)  08/02/14 229 lb 6.4 oz (104.055 kg)      ASSESSMENT AND PLAN:  1  Autonomic dysfunction  patinet is not orthostatic today  I would keep on same regimen I encouraged him to increase his physical activity as this will help symptoms    2. ? Sleep apnea  Ptient has daytime fatigue  Snores a lot   Will set up for sleep study     Current medicines are reviewed at length with the patient today.  The patient does not have concerns regarding medicines.  The following changes have been made: None  Labs/ tests ordered today include:  BMET  No orders of the defined types were placed in this encounter.     Disposition:   FU with  in  6 mon    Signed, Dietrich Pates, MD  01/31/2015 10:15 AM    [facility] [address] [place], [place], [place]  [address] Phone: [phone]; Fax: [phone]

## 2015-01-31 NOTE — Patient Instructions (Signed)
Your physician recommends that you return for lab work in: TODAY [employer]ell[employer](BMET)  Your physician recommends that you continue on your current medications as directed. Please refer to the Current Medication list given to you today.  Your physician has recommended that you have a sleep study. This test records several body functions during sleep, including: brain activity, eye movement, oxygen and carbon dioxide blood levels, heart rate and rhythm, breathing rate and rhythm, the flow of air through your mouth and nose, snoring, body muscle movements, and chest and belly movement.  Your physician wants you to follow-up in: 6 MONTHS WITH DR ROSS.  You will receive a reminder letter in the mail two months in advance. If you don't receive a letter, please call our office to schedule the follow-up appointment.

## 2015-02-03 ENCOUNTER — Telehealth: Payer: Self-pay | Admitting: Internal Medicine

## 2015-02-03 NOTE — Telephone Encounter (Signed)
New message ° ° ° ° ° °Returning a nurses call °

## 2015-02-03 NOTE — Telephone Encounter (Signed)
Informed of lab results.  Pt verbalizes understanding. Sleep study scheduled for May.

## 2015-02-27 ENCOUNTER — Other Ambulatory Visit: Payer: Self-pay | Admitting: Internal Medicine

## 2015-03-13 ENCOUNTER — Other Ambulatory Visit: Payer: Self-pay | Admitting: Internal Medicine

## 2015-03-17 DIAGNOSIS — F3132 Bipolar disorder, current episode depressed, moderate: Secondary | ICD-10-CM | POA: Diagnosis not present

## 2015-03-20 DIAGNOSIS — F3132 Bipolar disorder, current episode depressed, moderate: Secondary | ICD-10-CM | POA: Diagnosis not present

## 2015-03-25 ENCOUNTER — Ambulatory Visit (HOSPITAL_BASED_OUTPATIENT_CLINIC_OR_DEPARTMENT_OTHER): Payer: Medicare Other | Attending: Internal Medicine | Admitting: Radiology

## 2015-03-25 VITALS — Ht 72.0 in | Wt 224.0 lb

## 2015-03-25 DIAGNOSIS — G4710 Hypersomnia, unspecified: Secondary | ICD-10-CM | POA: Diagnosis not present

## 2015-03-25 DIAGNOSIS — R0683 Snoring: Secondary | ICD-10-CM | POA: Insufficient documentation

## 2015-03-25 DIAGNOSIS — G4733 Obstructive sleep apnea (adult) (pediatric): Secondary | ICD-10-CM | POA: Insufficient documentation

## 2015-03-25 DIAGNOSIS — R400 Somnolence: Secondary | ICD-10-CM | POA: Insufficient documentation

## 2015-03-25 DIAGNOSIS — R5383 Other fatigue: Secondary | ICD-10-CM | POA: Insufficient documentation

## 2015-04-03 DIAGNOSIS — F3132 Bipolar disorder, current episode depressed, moderate: Secondary | ICD-10-CM | POA: Diagnosis not present

## 2015-04-10 DIAGNOSIS — F3132 Bipolar disorder, current episode depressed, moderate: Secondary | ICD-10-CM | POA: Diagnosis not present

## 2015-04-15 ENCOUNTER — Telehealth: Payer: Self-pay | Admitting: Internal Medicine

## 2015-04-15 NOTE — Telephone Encounter (Signed)
Walk in pt form-[facility]-paper dropped off Thurston Holenne covering Dr.Ross Thursday will give to her.

## 2015-04-17 ENCOUNTER — Telehealth: Payer: Self-pay | Admitting: Internal Medicine

## 2015-04-17 ENCOUNTER — Other Ambulatory Visit: Payer: Self-pay | Admitting: Neurology

## 2015-04-17 NOTE — Telephone Encounter (Signed)
Rx signed and faxed.

## 2015-04-17 NOTE — Telephone Encounter (Signed)
[facility] & completed by Dr.Ross pt called and aware ready for pick up.

## 2015-04-18 ENCOUNTER — Telehealth: Payer: Self-pay | Admitting: Internal Medicine

## 2015-04-18 NOTE — Telephone Encounter (Signed)
[facility]  [facility] picked up by pt.

## 2015-04-28 ENCOUNTER — Telehealth: Payer: Self-pay | Admitting: Neurology

## 2015-04-28 NOTE — Telephone Encounter (Signed)
I spoke with Dr Minta Balsam and he would like to speak with Dr Tenny Craw about this pt's autonomic dysfunction.  He is scheduled to provide anesthesia to the pt on Wednesday and he is trying to understand the pt's presentation and etiology of autonomic dysfunction.  Dr Minta Balsam can be reached at [phone].

## 2015-04-28 NOTE — Telephone Encounter (Signed)
Dr. Minta Balsam with [facility] would like to speak with Dr. Anne Hahn about this mutual patient.  Please call [phone].  Thanks!

## 2015-04-28 NOTE — Telephone Encounter (Signed)
Follow up    Dr. Minta Balsam is calling to speak to Dr. Tenny Craw pertaining to the mutual patient.   Please give him a call back

## 2015-04-28 NOTE — Telephone Encounter (Signed)
I called Dr. Minta Balsam to clarify who he wanted to speak to. He had questions for Dr. Anne Hahn regarding the patient's neurological status, as the patient was a poor historian. Dr. Anne Hahn was available to speak with him and answered all of his questions.

## 2015-04-29 NOTE — Telephone Encounter (Signed)
I spoke to Dr Minta Balsam  Discussed pt's autonomic dysfunction.  Would recomm IV fluids as needed for procedure.

## 2015-05-01 ENCOUNTER — Telehealth: Payer: Self-pay | Admitting: Cardiology

## 2015-05-01 NOTE — Sleep Study (Signed)
   NAME: Jose Li DATE OF BIRTH:  1956-06-17 MEDICAL RECORD NUMBER [id number]  LOCATION: [facility]  PHYSICIAN: Rodriquez Thorner R  DATE OF STUDY: 03/25/2015  SLEEP STUDY TYPE: Nocturnal Polysomnogram               REFERRING PHYSICIAN: Pricilla Riffle, MD  INDICATION FOR STUDY: Witnessed apnea, excessive daytime fatigue, am headaches  EPWORTH SLEEPINESS SCORE: 14 HEIGHT: 6' (182.9 cm)  WEIGHT: 224 lb (101.606 kg)    Body mass index is 30.37 kg/(m^2).  NECK SIZE: 17.5 in.  MEDICATIONS: Reviewed in the chart  SLEEP ARCHITECTURE: The patient slept for a total of 134 minutes out of a total sleep period time of 385 minutes.  There was no slow wave sleep and 51 minutes of REM sleep.  The onset to sleep latency was normal at 14 minutes and the onset to REM sleep latency was normal at 127 minutes.  The sleep efficiency was markedly reduced at 30%.   RESPIRATORY DATA: There were a total of 115 apneas, of which, 90 were obstructive, 16 were central and 9 were mixed apneas.  There were 78 hypopneas.  Most events occurred in the supine position during REM sleep.  The AHI was 86 events per hour consistent with severe obstructive sleep apnea/hypopnea syndrome.  There was moderate to severe snoring during the study.  OXYGEN DATA: The average oxygen saturation during the study was 87%.  The lowest oxygen saturation was 67%.  The time spent with oxygen saturations < 88% was 59 minutes.    CARDIAC DATA: The patient maintained NSR with PVC's and PAC's.  The average heart rate was 83 bpm and the lowest heart rate was 39 bpm.    MOVEMENT/PARASOMNIA: There were no periodic limb movements and no REM sleep behavior disorders noted.   IMPRESSION/ RECOMMENDATION:   1.  Severe obstructive sleep apnea/hypopnea syndrome with an AHI of 86 events per hour.  Most events occurred in the supine position during REM sleep. 2.  Severe oxygen desaturations were noted during respiratory events with the  lowest oxygen saturation being 67%.  The time spent with oxygen saturations < 88% was 59 minutes 3.  Abnormal sleep architecture with no slow wave sleep. 4.  Markedly reduced sleep efficiency with increased frequency of awakenings secondary to respiratory events.   5.  PVC's and PAC's were noted during the study. 6.  Given patient's significant daytime sleepiness with increased epworth sleepiness scale, significant nighttime hypoxemia and severe OSA, CPAP titration would be appropriate.  Treatment would also include careful attention to proper sleep hygiene, weight reduction given elevated BMI, avoidance of sleeping in the supine position and avoidance of alcohol within four hours of bedtime.  Specific treatment decisions should be tailored to each patient based upon the clinical situation and all treatment options should be considered.  The patient should be instructed to avoid driving if sleepy and careful clinical follow up is needed to ensure that the patient's symptoms are improving with therapy and the PAP adherence is supported and measured if prescribed.    Signed: Quintella Reichert Diplomate, American Board of Sleep Medicine  ELECTRONICALLY SIGNED ON:  05/01/2015, 8:05 PM [facility] PH: [phone]   FX: [phone] ACCREDITED BY THE AMERICAN ACADEMY OF SLEEP MEDICINE

## 2015-05-01 NOTE — Telephone Encounter (Signed)
Please let patient know that they have severe sleep apnea and recommend CPAP titration. Please set up titration in the sleep lab ASAP as the patient had severe oxygen desaturations at night.  Please try to get them worked in in the next week.

## 2015-05-02 ENCOUNTER — Other Ambulatory Visit: Payer: Self-pay | Admitting: *Deleted

## 2015-05-02 DIAGNOSIS — G4733 Obstructive sleep apnea (adult) (pediatric): Secondary | ICD-10-CM

## 2015-05-02 NOTE — Telephone Encounter (Signed)
Left message for patient to call back  

## 2015-05-02 NOTE — Telephone Encounter (Signed)
Patient is aware of results.   CPAP Titration scheduled for 05/06/15

## 2015-05-06 ENCOUNTER — Ambulatory Visit (HOSPITAL_BASED_OUTPATIENT_CLINIC_OR_DEPARTMENT_OTHER): Payer: Medicare Other | Attending: Cardiology | Admitting: Radiology

## 2015-05-06 VITALS — Ht 72.0 in | Wt 234.0 lb

## 2015-05-06 DIAGNOSIS — G4733 Obstructive sleep apnea (adult) (pediatric): Secondary | ICD-10-CM | POA: Diagnosis not present

## 2015-05-15 ENCOUNTER — Telehealth: Payer: Self-pay | Admitting: Cardiology

## 2015-05-15 ENCOUNTER — Other Ambulatory Visit: Payer: Self-pay | Admitting: Family Medicine

## 2015-05-15 DIAGNOSIS — G4733 Obstructive sleep apnea (adult) (pediatric): Secondary | ICD-10-CM

## 2015-05-15 NOTE — Progress Notes (Signed)
   NAME: Jose HoopsGary D Goon DATE OF BIRTH:  [birth year] MEDICAL RECORD NUMBER [id number]  LOCATION: [facility]  PHYSICIAN: Pratyush Ammon R  DATE OF STUDY: 05/06/2015  SLEEP STUDY TYPE: Positive Airway Pressure Titration               REFERRING PHYSICIAN: Quintella Reicherturner, Brentley Landfair R, MD  INDICATION FOR STUDY: OSA  EPWORTH SLEEPINESS SCORE: 14 HEIGHT: 6' (182.9 cm)  WEIGHT: 234 lb (106.142 kg)    Body mass index is 31.73 kg/(m^2).  NECK SIZE: 17.5 in.  MEDICATIONS: Reviewed in the chart  SLEEP ARCHITECTURE: The patient slept for a total of 363 minutes out of a total sleep period time of 405 minutes.  There was no REM or slow wave sleep.  The onset to sleep latency was 0.5 minutes.  The sleep efficiency was 89%.    RESPIRATORY DATA: The patient was started on CPAP at 5cm H2O and titrated for respiratory events and snoring to 17cm H2O.  He could not be adequately titrated on CPAP due to continued respiratory events and was changed to BiPAP.  He was started on BiPAP at 20/16 cm H2O and titrated to 23/6319mmHg.  The patient was not able to be titrated adequately on BiPAP due to lack of sleep time.  There was no snoring with the titration.  OXYGEN DATA: The average oxygen saturation was 91%.  The lowest oxygen saturation was 77%.  The time spent with oxygen saturations <88% was 50 minutes.    CARDIAC DATA: The patient maintained NSR with PVC's and PAC's.  The average heart rate was 85 bpm and the lowest heart rate was 38 bpm.  MOVEMENT/PARASOMNIA: There were an increased number of periodic limb movements with a PLMS index of 38/hr. There were no REM sleep behavior disorders.  IMPRESSION/ RECOMMENDATION:   1.  The patient was tried but unsuccessful at CPAP titration due to continued respiratory events.   2.  Recommend repeat titration using BiPAP.  Signed:  Quintella ReichertURNER,Linville Decarolis R Diplomate, American Board of Sleep Medicine  ELECTRONICALLY SIGNED ON:  05/15/2015, 9:31 PM [facility] PH: [phone]   FX: [phone] ACCREDITED BY THE AMERICAN ACADEMY OF SLEEP MEDICINE

## 2015-05-15 NOTE — Telephone Encounter (Signed)
Pease let patient know that he had unsuccessful CPAP titration and set up for in lab BiPAP titration starting at 8/4cm H2O.

## 2015-05-16 ENCOUNTER — Encounter: Payer: Self-pay | Admitting: *Deleted

## 2015-05-16 NOTE — Telephone Encounter (Signed)
I have left a message to let the patient know that as of right now his  BiPAP Titration has been scheduled for August 15th. I asked that he please call me to confirm that he got the message. I am also sending a letter to confirm.

## 2015-05-16 NOTE — Addendum Note (Signed)
Addended by: Arcola JanskyOOK, BETHANY M on: 05/16/2015 10:27 AM   Modules accepted: Orders

## 2015-05-16 NOTE — Telephone Encounter (Signed)
Left message to call me back.  Appt. For BiPAP Titration is being held for Tuesday, July 5.

## 2015-05-29 ENCOUNTER — Ambulatory Visit (INDEPENDENT_AMBULATORY_CARE_PROVIDER_SITE_OTHER): Payer: Medicare Other | Admitting: Student

## 2015-05-29 ENCOUNTER — Encounter: Payer: Self-pay | Admitting: Student

## 2015-05-29 VITALS — BP 140/79 | HR 88 | Temp 97.7°F | Ht 72.0 in | Wt 223.0 lb

## 2015-05-29 DIAGNOSIS — E785 Hyperlipidemia, unspecified: Secondary | ICD-10-CM

## 2015-05-29 DIAGNOSIS — G40909 Epilepsy, unspecified, not intractable, without status epilepticus: Secondary | ICD-10-CM | POA: Diagnosis not present

## 2015-05-29 DIAGNOSIS — G43909 Migraine, unspecified, not intractable, without status migrainosus: Secondary | ICD-10-CM | POA: Diagnosis not present

## 2015-05-29 MED ORDER — ATORVASTATIN CALCIUM 20 MG PO TABS
20.0000 mg | ORAL_TABLET | Freq: Every day | ORAL | Status: DC
Start: 2015-05-29 — End: 2015-12-30

## 2015-05-29 MED ORDER — IBUPROFEN 600 MG PO TABS: ORAL_TABLET | ORAL | Status: DC

## 2015-05-29 NOTE — Patient Instructions (Signed)
It is nice meeting you! We have talked about your cholestrol and headache today. We are getting your lipid labs today. I will call you if I have to change your medication based on the result. For now, I sent a refill to your pharmacy.  For your migraine, I you can continue your ibuprofen as prescribed. I couldn't not prescribe a new medication due ton your insurance.  Below is an information on migraine headache for you to read.  Thank you!  Migraine Headache A migraine headache is an intense, throbbing pain on one or both sides of your head. A migraine can last for 30 minutes to several hours. CAUSES  The exact cause of a migraine headache is not always known. However, a migraine may be caused when nerves in the brain become irritated and release chemicals that cause inflammation. This causes pain. Certain things may also trigger migraines, such as:  Alcohol.  Smoking.  Stress.  Menstruation.  Aged cheeses.  Foods or drinks that contain nitrates, glutamate, aspartame, or tyramine.  Lack of sleep.  Chocolate.  Caffeine.  Hunger.  Physical exertion.  Fatigue.  Medicines used to treat chest pain (nitroglycerine), birth control pills, estrogen, and some blood pressure medicines. SIGNS AND SYMPTOMS  Pain on one or both sides of your head.  Pulsating or throbbing pain.  Severe pain that prevents daily activities.  Pain that is aggravated by any physical activity.  Nausea, vomiting, or both.  Dizziness.  Pain with exposure to bright lights, loud noises, or activity.  General sensitivity to bright lights, loud noises, or smells. Before you get a migraine, you may get warning signs that a migraine is coming (aura). An aura may include:  Seeing flashing lights.  Seeing bright spots, halos, or zigzag lines.  Having tunnel vision or blurred vision.  Having feelings of numbness or tingling.  Having trouble talking.  Having muscle weakness. DIAGNOSIS  A  migraine headache is often diagnosed based on:  Symptoms.  Physical exam.  A CT scan or MRI of your head. These imaging tests cannot diagnose migraines, but they can help rule out other causes of headaches. TREATMENT Medicines may be given for pain and nausea. Medicines can also be given to help prevent recurrent migraines.  HOME CARE INSTRUCTIONS  Only take over-the-counter or prescription medicines for pain or discomfort as directed by your health care provider. The use of long-term narcotics is not recommended.  Lie down in a dark, quiet room when you have a migraine.  Keep a journal to find out what may trigger your migraine headaches. For example, write down:  What you eat and drink.  How much sleep you get.  Any change to your diet or medicines.  Limit alcohol consumption.  Quit smoking if you smoke.  Get 7-9 hours of sleep, or as recommended by your health care provider.  Limit stress.  Keep lights dim if bright lights bother you and make your migraines worse. SEEK IMMEDIATE MEDICAL CARE IF:   Your migraine becomes severe.  You have a fever.  You have a stiff neck.  You have vision loss.  You have muscular weakness or loss of muscle control.  You start losing your balance or have trouble walking.  You feel faint or pass out.  You have severe symptoms that are different from your first symptoms. MAKE SURE YOU:   Understand these instructions.  Will watch your condition.  Will get help right away if you are not doing well or get worse.  Document Released: 11/01/2005 Document Revised: 03/18/2014 Document Reviewed: 07/09/2013 [facility] Patient Information 2015 [place], [place]. This information is not intended to replace advice given to you by your health care provider. Make sure you discuss any questions you have with your health care provider.

## 2015-05-29 NOTE — Assessment & Plan Note (Signed)
Epilepsy: no seizure activity since last visit. Taking his Keppra religiously

## 2015-05-29 NOTE — Progress Notes (Signed)
   Subjective:    Patient ID: Jose Li, male    DOB: October 03, 1956, 59 y.o.   MRN: [id number]  HPI CC: patient here for refill on his atorvastatin.  Hyperlipidemia: last lipid in 2014. On atorvastatin 20 mg daily. Didn't have lab works since 2014.     Headache: over the right temple. Non-radiating. Denies vision changes. Similar to his migraine headache. On and off but more frequent. Pain is throbbing but he also nodes his head up and down when asked if it is sharp. Pain is every other day. Lasts about an hour or two. Worse when he is outside. Better in dark room. Pain 6/10. Took 4 tablet (two at a time) of Advil that doesn't help. On propranolol in the past. Didn't "agree with me". Caused him lightheaded and nauseous.  Denies neurologic sxs.  Reports that he has a follow up apt on his sleep study performed recently. Reports fatigue during the day time. Denies seizure episode since he was here last.  Review of Systems  Smokes about 2 packs a weekend. Not interested in quitting. No EtOH. No elicit drugs.  Lives with father at home.      Objective:   Physical Exam Gen: No acute distress. Alert, cooperative with exam HEENT:  Oropharynx clear. MMM CV: RRR. No murmurs, rubs, or gallops noted. Resp: CTAB. No wheezing, crackles, or rhonchi noted. Abd: +BS. Soft, non-distended, non-tender. Ext: No edema. No gross deformities. Neuro: Alert and oriented, No gross focal deficits, slow speech       Assessment & Plan:

## 2015-05-29 NOTE — Assessment & Plan Note (Signed)
Hyperlipidemia: last lipid panel in 2014. CH 158, TG 379, HDL 28, LDL 54.  Will get lipid panel today  Refilled his atorvastatin today. May change his rx pending his lab results.

## 2015-05-29 NOTE — Assessment & Plan Note (Addendum)
Headache: unchanged from baseline. Likely migraine. No red flags. No neurologic sxs. Attempted to give Rx for Fioricet but not on formulary. Couldn't tolerate propranolol in the past. - will continue his Advil PRN. - provided with resources to read on migraine

## 2015-05-30 LAB — LIPID PANEL
CHOL/HDL RATIO: 5.9 ratio
CHOLESTEROL: 136 mg/dL (ref 0–200)
HDL: 23 mg/dL — AB (ref >=40)
LDL CALC: 43 mg/dL (ref 0–99)
TRIGLYCERIDES: 352 mg/dL — AB (ref <150)
VLDL: 70 mg/dL — ABNORMAL HIGH (ref 0–40)

## 2015-06-11 ENCOUNTER — Other Ambulatory Visit: Payer: Self-pay | Admitting: Family Medicine

## 2015-06-11 DIAGNOSIS — E785 Hyperlipidemia, unspecified: Secondary | ICD-10-CM

## 2015-06-11 NOTE — Telephone Encounter (Signed)
Filled this Rx on 05/29/2015 with 11 refills. Called and confirmed with the pharmacy. Alwyn Ren

## 2015-06-19 ENCOUNTER — Other Ambulatory Visit: Payer: Self-pay | Admitting: Internal Medicine

## 2015-06-19 ENCOUNTER — Other Ambulatory Visit: Payer: Self-pay | Admitting: Family Medicine

## 2015-06-26 DIAGNOSIS — F3132 Bipolar disorder, current episode depressed, moderate: Secondary | ICD-10-CM | POA: Diagnosis not present

## 2015-06-27 ENCOUNTER — Ambulatory Visit (HOSPITAL_BASED_OUTPATIENT_CLINIC_OR_DEPARTMENT_OTHER): Payer: Medicare Other | Attending: Cardiology

## 2015-06-27 DIAGNOSIS — R0683 Snoring: Secondary | ICD-10-CM | POA: Diagnosis not present

## 2015-06-27 DIAGNOSIS — G4733 Obstructive sleep apnea (adult) (pediatric): Secondary | ICD-10-CM | POA: Diagnosis not present

## 2015-06-27 DIAGNOSIS — I493 Ventricular premature depolarization: Secondary | ICD-10-CM | POA: Insufficient documentation

## 2015-06-27 DIAGNOSIS — G4730 Sleep apnea, unspecified: Secondary | ICD-10-CM | POA: Diagnosis present

## 2015-06-30 ENCOUNTER — Telehealth: Payer: Self-pay | Admitting: Cardiology

## 2015-06-30 NOTE — Telephone Encounter (Signed)
Pt had successful PAP titration. Please setup appointment in 10 weeks. Please let [facility] know that order for PAP is in EPIC.   

## 2015-06-30 NOTE — Telephone Encounter (Signed)
Patient

## 2015-06-30 NOTE — Addendum Note (Signed)
Addended by: Armanda Magic R on: 06/30/2015 10:06 AM   Modules accepted: Orders

## 2015-06-30 NOTE — Telephone Encounter (Signed)
Patient informed of results.  Stated verbal understanding.  [facility] Notified

## 2015-06-30 NOTE — Sleep Study (Signed)
         SLEEP STUDY   Patient Name: Jose Li, Dickison Date: 06/27/2015 MRN: [id number] Gender: Male D.O.B: Jul 20, 1956 Age (years): 46 Referring Provider: Armanda Magic MD, ABSM Interpreting Physician: Armanda Magic MD, ABSM RPSGT: Melburn Popper  Weight (lbs): 220 Height (inches): 72 BMI: 30 Neck Size: 17.50  CLINICAL INFORMATION The patient is referred for a BiPAP titration to treat sleep apnea.  Date of NPSG, Split Night or HST:05/01/2015  SLEEP STUDY TECHNIQUE As per the AASM Manual for the Scoring of Sleep and Associated Events v2.3 (April 2016) with a hypopnea requiring 4% desaturations.  The channels recorded and monitored were frontal, central and occipital EEG, electrooculogram (EOG), submentalis EMG (chin), nasal and oral airflow, thoracic and abdominal wall motion, anterior tibialis EMG, snore microphone, electrocardiogram, and pulse oximetry. Bilevel positive airway pressure (BPAP) was initiated at the beginning of the study and titrated to treat sleep-disordered breathing.  MEDICATIONS Medications administered by patient during sleep study : No sleep medicine administered.  Medications at home:  ASA, Lipitor, Cogentin, Zyrtec, Cymbalta, Florinef, Advil, Keppra, Lyrica, Pindolol, KCl, Risperidone, Trazadone.  RESPIRATORY PARAMETERS Optimal IPAP Pressure (cm): 23  AHI at Optimal Pressure (/hr) 2.3 Optimal EPAP Pressure (cm):19   Overall Minimal O2 (%):80.00   Minimal O2 at Optimal Pressure (%):91.0  SLEEP ARCHITECTURE Start Time:10:54:25 PM Stop Time:5:40:10 AM Total Time (min):405.8 Total Sleep Time (min):299.0 Sleep Latency (min):13.5 Sleep Efficiency (%):73.7 REM Latency (min):65.5 WASO (min):93.3 Stage N1 (%):1.17  Stage N2 (%): 84.11  Stage N3 (%): 0.00  Stage R (%): 14.72 Supine (%):100.00  Arousal Index (/hr):33.7      CARDIAC DATA The 2 lead EKG demonstrated sinus rhythm. The mean heart rate was 71.33 beats per minute. Other EKG findings include:  PVCs.  LEG MOVEMENT DATA The total Periodic Limb Movements of Sleep (PLMS) were 0. The PLMS index was 0.00. A PLMS index of <15 is considered normal in adults.  IMPRESSIONS An optimal PAP pressure was selected for this patient of 23/19 cm of water. Central sleep apnea was not noted during this titration (CAI = 0.0/h). Severe oxygen desaturations were observed during this titration (min O2 = 80.00%). The patient snored with Loud snoring volume. 2-lead EKG demonstrated: PVCs Clinically significant periodic limb movements were not noted during this study. Arousals associated with PLMs were rare.  DIAGNOSIS Obstructive Sleep Apnea (327.23 [G47.33 ICD-10])  RECOMMENDATIONS Trial of BiPAP therapy on 23/19 cm H2O with a Large size Fisher[employer]Paykel Full Face Mask Simplus mask and heated humidification. Avoid alcohol, sedatives and other CNS depressants that may worsen sleep apnea and disrupt normal sleep architecture. Sleep hygiene should be reviewed to assess factors that may improve sleep quality. Weight management and regular exercise should be initiated or continued. Return to Sleep provide for re-evaluation after 10 weeks of therapy  Signed: Armanda Magic, MD ABSM Diplomate, American Board of Sleep Medicine

## 2015-07-17 ENCOUNTER — Other Ambulatory Visit: Payer: Self-pay | Admitting: Family Medicine

## 2015-07-18 ENCOUNTER — Other Ambulatory Visit: Payer: Self-pay | Admitting: *Deleted

## 2015-07-18 ENCOUNTER — Other Ambulatory Visit: Payer: Self-pay | Admitting: Family Medicine

## 2015-07-18 DIAGNOSIS — Z789 Other specified health status: Secondary | ICD-10-CM

## 2015-07-18 MED ORDER — CETIRIZINE HCL 10 MG PO TABS: 10.0000 mg | ORAL_TABLET | Freq: Every day | ORAL | Status: DC

## 2015-08-07 ENCOUNTER — Telehealth: Payer: Self-pay | Admitting: *Deleted

## 2015-08-07 ENCOUNTER — Encounter: Payer: Self-pay | Admitting: Internal Medicine

## 2015-08-07 ENCOUNTER — Ambulatory Visit (INDEPENDENT_AMBULATORY_CARE_PROVIDER_SITE_OTHER): Payer: Medicare Other | Admitting: Internal Medicine

## 2015-08-07 ENCOUNTER — Other Ambulatory Visit: Payer: Self-pay | Admitting: Internal Medicine

## 2015-08-07 VITALS — BP 136/84 | HR 85 | Ht 72.0 in | Wt 214.4 lb

## 2015-08-07 DIAGNOSIS — R55 Syncope and collapse: Secondary | ICD-10-CM

## 2015-08-07 DIAGNOSIS — G909 Disorder of the autonomic nervous system, unspecified: Secondary | ICD-10-CM | POA: Diagnosis not present

## 2015-08-07 DIAGNOSIS — G4730 Sleep apnea, unspecified: Secondary | ICD-10-CM

## 2015-08-07 DIAGNOSIS — G901 Familial dysautonomia [Riley-Day]: Secondary | ICD-10-CM

## 2015-08-07 DIAGNOSIS — E876 Hypokalemia: Secondary | ICD-10-CM

## 2015-08-07 LAB — BASIC METABOLIC PANEL
BUN: 2 mg/dL — ABNORMAL LOW (ref 6–23)
CHLORIDE: 104 meq/L (ref 96–112)
CO2: 29 meq/L (ref 19–32)
Calcium: 8.1 mg/dL — ABNORMAL LOW (ref 8.4–10.5)
Creatinine, Ser: 1 mg/dL (ref 0.40–1.50)
GFR: 81.69 mL/min (ref >60.00)
GLUCOSE: 91 mg/dL (ref 70–99)
POTASSIUM: 2.9 meq/L — AB (ref 3.5–5.1)
SODIUM: 141 meq/L (ref 135–145)

## 2015-08-07 MED ORDER — MIDODRINE HCL 5 MG PO TABS: ORAL_TABLET | ORAL | Status: DC

## 2015-08-07 MED ORDER — POTASSIUM CHLORIDE CRYS ER 20 MEQ PO TBCR: EXTENDED_RELEASE_TABLET | ORAL | Status: DC

## 2015-08-07 NOTE — Patient Instructions (Signed)
Your physician has recommended you make the following change in your medication:  1.) INCREASE MIDODRINE TO 1 TABLET IN AM; KEEP 1/2 TABLET IN PM  Your physician recommends that you return for lab work TODAY (BMET)  Your physician recommends that you schedule a follow-up appointment in: 6 WEEKS WITH DR. Tenny Craw.  DRINK WATER FREELY, PUSH FLUIDS.

## 2015-08-07 NOTE — Addendum Note (Signed)
Addended by: Drue Dun on: 08/07/2015 12:26 PM   Modules accepted: Orders

## 2015-08-07 NOTE — Telephone Encounter (Signed)
Patient returned phone call. His potassium is 2.9. Verified he is taking potassium 20 meq--2 tabs in AM 1 tab in PM.  Reviewed with Dr. Tenny Craw. Pt is to take 3 addition tablets today, then starting tomorrow take 2 tabs in AM and 2 in PM. Return next Thursday for BMET.  Pt verbalizes understanding and agreement with this plan.

## 2015-08-07 NOTE — Progress Notes (Signed)
Cardiology Office Note   Date:  08/07/2015   ID:  Jose Li, DOB August 08, 1956, MRN [id number]  PCP:  Almon Hercules, MD  Cardiologist:   Dietrich Pates, MD   No chief complaint on file.     History of Present Illness: Jose Li is a 59 y.o. male with a history of Autonomic dysfunction  I saw him in march   Since seen he says he has been dizzy a little more   No syncope but has had a couple close spells  Says he is drinking fluids  Wearing bipap  Says he may be a little less tired.       Current Outpatient Prescriptions  Medication Sig Dispense Refill  . aspirin 81 MG EC tablet Take 81 mg by mouth daily.      Marland Kitchen atorvastatin (LIPITOR) 20 MG tablet Take 1 tablet (20 mg total) by mouth daily. 30 tablet 11  . benztropine (COGENTIN) 1 MG tablet Take 1 mg by mouth 2 (two) times daily.      . cetirizine (ZYRTEC) 10 MG tablet Take 1 tablet (10 mg total) by mouth daily. 30 tablet 9  . DULoxetine (CYMBALTA) 30 MG capsule Take 30 mg by mouth every evening. With Cymbalta 60 mg every morning.    . DULoxetine (CYMBALTA) 60 MG capsule Take 60 mg by mouth daily. 30 MG added in the afternoons    . fludrocortisone (FLORINEF) 0.1 MG tablet TAKE 1/2 TABLET BY MOUTH AT 8 AM AND 1/2 TABLET AT 2 PM 30 tablet 5  . ibuprofen (ADVIL,MOTRIN) 600 MG tablet TAKE ONE TABLET BY MOUTH EVERY 6 HOURS AS NEEDED FOR HEADACHE 30 tablet 3  . levETIRAcetam (KEPPRA) 750 MG tablet TAKE TWO TABLETS BY MOUTH TWICE DAILY. 360 tablet 3  . LYRICA 75 MG capsule TAKE ONE CAPSULE BY MOUTH TWICE DAILY 60 capsule 5  . midodrine (PROAMATINE) 5 MG tablet TAKE ONE-HALF TABLET BY MOUTH AT 8AM AND  ONE-HALF AT 2PM 90 tablet 0  . pindolol (VISKEN) 5 MG tablet TAKE ONE-HALF TABLET BY MOUTH TWICE DAILY 30 tablet 5  . potassium chloride SA (K-DUR,KLOR-CON) 20 MEQ tablet 2 tablets in the morning and 1 tablet in the evening. 270 tablet 3  . risperiDONE (RISPERDAL) 2 MG tablet Take 1/2 tab in the am, 1/2 tab at lunch, 2 tab at night    .  thiamine 100 MG tablet Take 100 mg by mouth daily.      . traZODone (DESYREL) 100 MG tablet 150 mg at bedtime.      No current facility-administered medications for this visit.    Allergies:   Erythromycin; Hctz; and Propranolol   Past Medical History  Diagnosis Date  . Allergy   . Anxiety   . Depression   . Hyperlipidemia   . Substance abuse   . Seizures     none for last 6-19months  . Hypertension, essential, benign 08/02/2012  . Orthostatic hypotension 10/09/2013    Past Surgical History  Procedure Laterality Date  . Fracture surgery      right hip and femur  . Joint replacement      right hip  . Tracheostomy  08/1999    Had pneumoniaand was in a coma 10 days  . Hernia repair  07/2009    umbilical hernia/ got infected     Social History:  The patient  reports that he has been smoking Cigarettes.  He has a 9 pack-year smoking history. He has never used  smokeless tobacco. He reports that he does not drink alcohol or use illicit drugs.   Family History:  The patient's family history includes Diabetes in his father; Heart disease in his brother and mother.    ROS:  Please see the history of present illness. All other systems are reviewed and  Negative to the above problem except as noted.    PHYSICAL EXAM: VS:  BP 136/84 mmHg  Pulse 85  Ht 6' (1.829 m)  Wt 214 lb 6.4 oz (97.251 kg)  BMI 29.07 kg/m2  SpO2 90%  GEN: Well nourished, well developed, in no acute distress HEENT: normal Neck: no JVD, carotid bruits, or masses Cardiac: RRR; no murmurs, rubs, or gallops,no edema  Respiratory:  clear to auscultation bilaterally, normal work of breathing GI: soft, nontender, nondistended, + BS  No hepatomegaly  MS: no deformity Moving all extremities   Skin: warm and dry, no rash Neuro:  Strength and sensation are intact Psych: euthymic mood, full affect   EKG:  EKG is not  ordered today.   Lipid Panel    Component Value Date/Time   CHOL 136 05/29/2015 1447    TRIG 352* 05/29/2015 1447   HDL 23* 05/29/2015 1447   CHOLHDL 5.9 05/29/2015 1447   VLDL 70* 05/29/2015 1447   LDLCALC 43 05/29/2015 1447      Wt Readings from Last 3 Encounters:  08/07/15 214 lb 6.4 oz (97.251 kg)  06/28/15 220 lb (99.791 kg)  05/29/15 223 lb (101.152 kg)      ASSESSMENT AND PLAN: 1  Autonomic dysfunction  Pt is orthostatic today   BP is high initially   I would increase am midodrine to 5 mg  Continue others as is. I would recomm that he keep water bottle with him Drink throughout the day Elev head of bed  Stay active   WIll check BMET today    F/U in 6 wks  2.  Sleep apnea  I would recomm continuing bipap  I reviewed with him     Signed, Dietrich Pates, MD  08/07/2015 11:36 AM    [facility] [address] [place], [place], [place]  [address] Phone: [phone]; Fax: [phone]

## 2015-08-07 NOTE — Telephone Encounter (Signed)
Called patient to discuss lab results from today's visit, left message on home and mobile numbers.

## 2015-08-14 ENCOUNTER — Other Ambulatory Visit (INDEPENDENT_AMBULATORY_CARE_PROVIDER_SITE_OTHER): Payer: Medicare Other

## 2015-08-14 ENCOUNTER — Telehealth: Payer: Self-pay | Admitting: *Deleted

## 2015-08-14 DIAGNOSIS — E876 Hypokalemia: Secondary | ICD-10-CM

## 2015-08-14 LAB — BASIC METABOLIC PANEL
BUN: 2 mg/dL — AB (ref 6–23)
CHLORIDE: 108 meq/L (ref 96–112)
CO2: 29 mEq/L (ref 19–32)
Calcium: 8.2 mg/dL — ABNORMAL LOW (ref 8.4–10.5)
Creatinine, Ser: 0.97 mg/dL (ref 0.40–1.50)
GFR: 84.61 mL/min (ref >60.00)
Glucose, Bld: 101 mg/dL — ABNORMAL HIGH (ref 70–99)
POTASSIUM: 3.2 meq/L — AB (ref 3.5–5.1)
Sodium: 142 mEq/L (ref 135–145)

## 2015-08-14 MED ORDER — POTASSIUM CHLORIDE CRYS ER 20 MEQ PO TBCR: EXTENDED_RELEASE_TABLET | ORAL | Status: DC

## 2015-08-14 NOTE — Telephone Encounter (Signed)
Per protocol, advised patient to increase potassium, 20 meq to 3 tablets in am and 3 tablets in pm. He verbalizes undertanding and agreement. Will have bmet rechecked next Thursday.

## 2015-08-15 ENCOUNTER — Encounter: Payer: Self-pay | Admitting: *Deleted

## 2015-08-15 ENCOUNTER — Telehealth: Payer: Self-pay | Admitting: *Deleted

## 2015-08-15 DIAGNOSIS — E876 Hypokalemia: Secondary | ICD-10-CM

## 2015-08-15 MED ORDER — POTASSIUM CHLORIDE CRYS ER 20 MEQ PO TBCR: EXTENDED_RELEASE_TABLET | ORAL | Status: DC

## 2015-08-15 NOTE — Telephone Encounter (Signed)
Spoke with patient. Informed of Dr. Charlott Rakes recommendations for med changes He will stop florinef  Starting tomorrow he will cut potassium back to two tablets per day. Taking midodrine 5 mg in am and 2.5 mg in pm as ordered at last OV. Will come Monday for repeat BMET.

## 2015-08-15 NOTE — Telephone Encounter (Signed)
error 

## 2015-08-15 NOTE — Telephone Encounter (Signed)
-----   Message from Dietrich Pates V, MD sent at 08/15/2015 10:40 AM EDT ----- Will need to cut back on florinef I increased midodrine on last visit  WIll need to use this Stop florinef. Keep on midodrine. Will need to cut back on KCL after today to 2 tabs per day  BMET on Monday

## 2015-08-18 ENCOUNTER — Other Ambulatory Visit (INDEPENDENT_AMBULATORY_CARE_PROVIDER_SITE_OTHER): Payer: Medicare Other | Admitting: *Deleted

## 2015-08-18 DIAGNOSIS — E876 Hypokalemia: Secondary | ICD-10-CM

## 2015-08-18 LAB — BASIC METABOLIC PANEL
BUN: 1 mg/dL — AB (ref 6–23)
CALCIUM: 8.4 mg/dL (ref 8.4–10.5)
CO2: 24 mEq/L (ref 19–32)
Chloride: 107 mEq/L (ref 96–112)
Creatinine, Ser: 0.93 mg/dL (ref 0.40–1.50)
GFR: 88.82 mL/min (ref >60.00)
GLUCOSE: 89 mg/dL (ref 70–99)
Potassium: 3.2 mEq/L — ABNORMAL LOW (ref 3.5–5.1)
SODIUM: 140 meq/L (ref 135–145)

## 2015-08-20 ENCOUNTER — Other Ambulatory Visit: Payer: Self-pay | Admitting: *Deleted

## 2015-08-20 ENCOUNTER — Telehealth: Payer: Self-pay | Admitting: *Deleted

## 2015-08-20 DIAGNOSIS — E876 Hypokalemia: Secondary | ICD-10-CM

## 2015-08-20 NOTE — Telephone Encounter (Signed)
Notes Recorded by Pricilla Riffle, MD on 08/19/2015 at 10:12 AM Patient should not be taking florinef. I would set up for 24 hour urine potassium test. He is on 20 bid of potassium now. Get Magnesium and CBC when he comes in to pick up urine container.

## 2015-08-21 ENCOUNTER — Other Ambulatory Visit: Payer: Medicare Other

## 2015-08-21 ENCOUNTER — Other Ambulatory Visit: Payer: Self-pay | Admitting: *Deleted

## 2015-08-21 ENCOUNTER — Encounter: Payer: Self-pay | Admitting: Cardiology

## 2015-08-28 ENCOUNTER — Other Ambulatory Visit: Payer: Self-pay | Admitting: Internal Medicine

## 2015-08-29 ENCOUNTER — Other Ambulatory Visit (INDEPENDENT_AMBULATORY_CARE_PROVIDER_SITE_OTHER): Payer: Medicare Other | Admitting: *Deleted

## 2015-08-29 DIAGNOSIS — F3132 Bipolar disorder, current episode depressed, moderate: Secondary | ICD-10-CM | POA: Diagnosis not present

## 2015-08-29 DIAGNOSIS — E876 Hypokalemia: Secondary | ICD-10-CM | POA: Diagnosis not present

## 2015-08-29 LAB — CBC
HEMATOCRIT: 40.6 % (ref 39.0–52.0)
Hemoglobin: 13.3 g/dL (ref 13.0–17.0)
MCH: 26.3 pg (ref 26.0–34.0)
MCHC: 32.8 g/dL (ref 30.0–36.0)
MCV: 80.4 fL (ref 78.0–100.0)
MPV: 9.4 fL (ref 8.6–12.4)
Platelets: 414 10*3/uL — ABNORMAL HIGH (ref 150–400)
RBC: 5.05 MIL/uL (ref 4.22–5.81)
RDW: 16.1 % — AB (ref 11.5–15.5)
WBC: 12.6 10*3/uL — ABNORMAL HIGH (ref 4.0–10.5)

## 2015-08-29 LAB — MAGNESIUM: Magnesium: 1.7 mg/dL (ref 1.5–2.5)

## 2015-08-29 NOTE — Telephone Encounter (Signed)
Patient will come today for lab work and pick up urine container. Advised him to start the urine collection on Sunday so that he can return it on Monday. He verbalizes understanding and agreement with this plan.

## 2015-09-02 ENCOUNTER — Telehealth: Payer: Self-pay | Admitting: Internal Medicine

## 2015-09-02 ENCOUNTER — Other Ambulatory Visit: Payer: Self-pay | Admitting: *Deleted

## 2015-09-02 NOTE — Telephone Encounter (Signed)
New Message  Pt called in states that he was advised to call in and get a 24 hour urine Analys. Per Lab was advised to send this to the nurse to ask if we can order this from [facility] for the pt because this is not completed in office. Please assist

## 2015-09-02 NOTE — Telephone Encounter (Signed)
Done. Script was taken directly to [facility] today, right after patient was in office.

## 2015-09-04 ENCOUNTER — Other Ambulatory Visit: Payer: Self-pay | Admitting: Internal Medicine

## 2015-09-07 DIAGNOSIS — E876 Hypokalemia: Secondary | ICD-10-CM | POA: Diagnosis not present

## 2015-09-16 ENCOUNTER — Other Ambulatory Visit: Payer: Self-pay | Admitting: *Deleted

## 2015-09-17 DIAGNOSIS — Z23 Encounter for immunization: Secondary | ICD-10-CM | POA: Diagnosis not present

## 2015-09-23 ENCOUNTER — Encounter: Payer: Self-pay | Admitting: Internal Medicine

## 2015-10-01 ENCOUNTER — Ambulatory Visit (INDEPENDENT_AMBULATORY_CARE_PROVIDER_SITE_OTHER): Payer: Medicare Other | Admitting: Cardiology

## 2015-10-01 ENCOUNTER — Encounter: Payer: Self-pay | Admitting: Cardiology

## 2015-10-01 VITALS — BP 112/72 | HR 76 | Ht 72.0 in | Wt 209.0 lb

## 2015-10-01 DIAGNOSIS — I10 Essential (primary) hypertension: Secondary | ICD-10-CM

## 2015-10-01 DIAGNOSIS — G4733 Obstructive sleep apnea (adult) (pediatric): Secondary | ICD-10-CM | POA: Diagnosis not present

## 2015-10-01 HISTORY — DX: Obstructive sleep apnea (adult) (pediatric): G47.33

## 2015-10-01 NOTE — Patient Instructions (Signed)

## 2015-10-01 NOTE — Progress Notes (Signed)
Cardiology Office Note   Date:  10/01/2015   ID:  Jose Li, DOB [birth year], MRN [id number]  PCP:  Almon Hercules, MD    Chief Complaint  Patient presents with  . Sleep Apnea  . Hypertension      History of Present Illness: Jose Li is a 59 y.o. male who presents for evaluation of OSA. He complained to Dr. Tenny Craw of having excessive daytime sleepiness and snoring.  He underwent PSG showing severe OSA with an AHI of 86/hr.  He underwent CPAP titration but could not be adequately titrated due to ongoing respiratory events.  He underwent BiPAP titration to 23/19cm H2O.  He now presents for follwup.  He is doing well on his BiPAP.  He says that he gets at least 4 hours on the BiPAP.  He uses a full face mask which he tolerates well.  He feels the pressure is adequate although occasionally will feel it is too much.  He has had some problems with mouth dryness.  He is using the humidifier.  Since going on BiPAP he feels more rested in the am and less tired during the day.  He sleeps on his side.  He says that the mask has been leaking and on his d/l there is a max lead of 118L/min.      Past Medical History  Diagnosis Date  . Allergy   . Anxiety   . Depression   . Hyperlipidemia   . Substance abuse   . Seizures (HCC)     none for last 6-41months  . Hypertension, essential, benign 08/02/2012  . Orthostatic hypotension 10/09/2013  . OSA (obstructive sleep apnea) 10/01/2015    Severe OSA with an AHI of 86/hr now on BiPAP at 23/19cm H2O    Past Surgical History  Procedure Laterality Date  . Fracture surgery      right hip and femur  . Joint replacement      right hip  . Tracheostomy  08/1999    Had pneumoniaand was in a coma 10 days  . Hernia repair  07/2009    umbilical hernia/ got infected     Current Outpatient Prescriptions  Medication Sig Dispense Refill  . aspirin 81 MG EC tablet Take 81 mg by mouth daily.      Marland Kitchen atorvastatin (LIPITOR) 20 MG tablet  Take 1 tablet (20 mg total) by mouth daily. 30 tablet 11  . benztropine (COGENTIN) 1 MG tablet Take 1 mg by mouth 2 (two) times daily.      . cetirizine (ZYRTEC) 10 MG tablet Take 1 tablet (10 mg total) by mouth daily. 30 tablet 9  . DULoxetine (CYMBALTA) 30 MG capsule Take 30 mg by mouth every evening. With Cymbalta 60 mg every morning.    . DULoxetine (CYMBALTA) 60 MG capsule Take 60 mg by mouth daily. 30 MG added in the afternoons    . ibuprofen (ADVIL,MOTRIN) 600 MG tablet TAKE ONE TABLET BY MOUTH EVERY 6 HOURS AS NEEDED FOR HEADACHE 30 tablet 3  . KLOR-CON M20 20 MEQ tablet Take 20 mEq by mouth 2 (two) times daily.     Marland Kitchen levETIRAcetam (KEPPRA) 750 MG tablet TAKE TWO TABLETS BY MOUTH TWICE DAILY. 360 tablet 3  . LYRICA 75 MG capsule TAKE ONE CAPSULE BY MOUTH TWICE DAILY 60 capsule 5  . midodrine (PROAMATINE) 5 MG tablet Take 1 tablet by mouth in  the 8 am and 1/2 tablet by mouth at 2 pm daily. 45 tablet 1  . pindolol (VISKEN) 5 MG tablet TAKE ONE-HALF TABLET BY MOUTH TWICE DAILY 30 tablet 0  . risperiDONE (RISPERDAL) 2 MG tablet Take by mouth. Take 1/2 tablet mouth in the am, 1/2 tab at lunch, 2 tab at night    . thiamine 100 MG tablet Take 100 mg by mouth daily.      . traZODone (DESYREL) 150 MG tablet Take 150 mg by mouth at bedtime.      No current facility-administered medications for this visit.    Allergies:   Erythromycin; Hctz; and Propranolol    Social History:  The patient  reports that he has been smoking Cigarettes.  He has a 9 pack-year smoking history. He has never used smokeless tobacco. He reports that he does not drink alcohol or use illicit drugs.   Family History:  The patient's family history includes Diabetes in his father; Heart disease in his brother and mother.    ROS:  Please see the history of present illness.   Otherwise, review of systems are positive for none.   All other systems are reviewed and negative.    PHYSICAL EXAM: VS:  BP 112/72 mmHg  Pulse 76   Ht 6' (1.829 m)  Wt 94.802 kg (209 lb)  BMI 28.34 kg/m2 , BMI Body mass index is 28.34 kg/(m^2). GEN: Well nourished, well developed, in no acute distress HEENT: normal Neck: no JVD, carotid bruits, or masses Cardiac: RRR; no murmurs, rubs, or gallops,no edema  Respiratory:  clear to auscultation bilaterally, normal work of breathing GI: soft, nontender, nondistended, + BS MS: no deformity or atrophy Skin: warm and dry, no rash Neuro:  Strength and sensation are intact Psych: euthymic mood, full affect   EKG:  EKG is not ordered today.    Recent Labs: 08/18/2015: BUN 1*; Creatinine, Ser 0.93; Potassium 3.2*; Sodium 140 08/29/2015: Hemoglobin 13.3; Magnesium 1.7; Platelets 414*    Lipid Panel    Component Value Date/Time   CHOL 136 05/29/2015 1447   TRIG 352* 05/29/2015 1447   HDL 23* 05/29/2015 1447   CHOLHDL 5.9 05/29/2015 1447   VLDL 70* 05/29/2015 1447   LDLCALC 43 05/29/2015 1447      Wt Readings from Last 3 Encounters:  10/01/15 94.802 kg (209 lb)  08/07/15 97.251 kg (214 lb 6.4 oz)  06/28/15 99.791 kg (220 lb)      ASSESSMENT AND PLAN:  1.  Severe OSA with an AHI of 86/hr now on BIPAP at 23/19cm H2O.  His d/l today showed an AHI of 16.9/hr on 23/19cm H2O.  His mask shows a significant leak and he has gotten an new seal for his mask that he has not changed out yet.  I will check a d/l in 4 weeks with the new seal.  I have encouarged him to be more compliant with his device and explained that insurance will not pay for his device if he is not 70% compliant in using his BiPAP at least 4 hours nightly.   2.  HTN - well controlled on current medical regimen.     Current medicines are reviewed at length with the patient today.  The patient has concerns regarding medicines.  The following changes have been made:  no change  Labs/ tests ordered today: See above Assessment and Plan No orders of the defined types were placed in this encounter.     Disposition:    FU  with me in 6 months  Signed, Quintella Reichert, MD  10/01/2015 10:54 AM    [facility] [address] [place], [place], [place]  [address] Phone: [phone]; Fax: [phone]

## 2015-10-02 ENCOUNTER — Other Ambulatory Visit: Payer: Self-pay | Admitting: Internal Medicine

## 2015-10-03 ENCOUNTER — Telehealth: Payer: Self-pay | Admitting: Internal Medicine

## 2015-10-03 NOTE — Telephone Encounter (Signed)
F/u  Pt stated he is returning RNphone call. Please call back and discuss.   

## 2015-10-15 ENCOUNTER — Other Ambulatory Visit: Payer: Self-pay | Admitting: Neurology

## 2015-10-15 ENCOUNTER — Encounter: Payer: Self-pay | Admitting: Cardiology

## 2015-10-30 ENCOUNTER — Ambulatory Visit (INDEPENDENT_AMBULATORY_CARE_PROVIDER_SITE_OTHER): Payer: Medicare Other | Admitting: Nurse Practitioner

## 2015-10-30 ENCOUNTER — Encounter: Payer: Self-pay | Admitting: Nurse Practitioner

## 2015-10-30 VITALS — BP 100/70 | HR 84 | Ht 72.0 in | Wt 206.4 lb

## 2015-10-30 DIAGNOSIS — I951 Orthostatic hypotension: Secondary | ICD-10-CM | POA: Diagnosis not present

## 2015-10-30 DIAGNOSIS — G40909 Epilepsy, unspecified, not intractable, without status epilepticus: Secondary | ICD-10-CM | POA: Diagnosis not present

## 2015-10-30 MED ORDER — LEVETIRACETAM 750 MG PO TABS: ORAL_TABLET | ORAL | Status: DC

## 2015-10-30 NOTE — Progress Notes (Signed)
I have read the note, and I agree with the clinical assessment and plan.  WILLIS,CHARLES KEITH   

## 2015-10-30 NOTE — Progress Notes (Signed)
[facility]  PATIENT: GERALDINE TESAR DOB: July 25, 1956   REASON FOR VISIT: follow up for epilepsy, hx of orthostatic hypotension HISTORY FROM:patient   HISTORY OF PRESENT ILLNESS:Mr. Gause is a 59 year old right-handed white male with a history of alcohol abuse and a history of seizures. The patient also has orthostatic hypotension, treated with Midodrine. The patient is on Keppra taking the 750 mg tablets, 2 tablets twice daily. The patient has not had any seizures since last seen. The patient is tolerating the [place] quite well. He has not had any blackouts associated with orthostatic hypotension. He is not operating a motor vehicle.He returns for reevaluation   REVIEW OF SYSTEMS: Full 14 system review of systems performed and notable only for those listed, all others are neg:  Constitutional: neg  Cardiovascular: neg Ear/Nose/Throat: ringing in the ears  Skin: neg Eyes: neg Respiratory: neg Gastroitestinal: neg  Hematology/Lymphatic: neg  Endocrine: neg Musculoskeletal:neg Allergy/Immunology: neg Neurological: neg Psychiatric: depression anxiety Sleep : obstructive sleep apnea   ALLERGIES: Allergies  Allergen Reactions  . Erythromycin   . Hctz [Hydrochlorothiazide]     "Makes him faint" per patient Dr. Harrington Challenger ([place]) took him off  . Propranolol     "Makes him faint" per patient Dr. Harrington Challenger ([place]) took him off      HOME MEDICATIONS: Outpatient Prescriptions Prior to Visit  Medication Sig Dispense Refill  . aspirin 81 MG EC tablet Take 81 mg by mouth daily.      Marland Kitchen atorvastatin (LIPITOR) 20 MG tablet Take 1 tablet (20 mg total) by mouth daily. 30 tablet 11  . benztropine (COGENTIN) 1 MG tablet Take 1 mg by mouth 2 (two) times daily.      . cetirizine (ZYRTEC) 10 MG tablet Take 1 tablet (10 mg total) by mouth daily. 30 tablet 9  . DULoxetine (CYMBALTA) 30 MG capsule Take 30 mg by mouth every evening. With Cymbalta 60 mg every morning.    . DULoxetine  (CYMBALTA) 60 MG capsule Take 60 mg by mouth daily. 30 MG added in the afternoons    . ibuprofen (ADVIL,MOTRIN) 600 MG tablet TAKE ONE TABLET BY MOUTH EVERY 6 HOURS AS NEEDED FOR HEADACHE 30 tablet 3  . KLOR-CON M20 20 MEQ tablet Take 20 mEq by mouth 2 (two) times daily.     Marland Kitchen levETIRAcetam (KEPPRA) 750 MG tablet TAKE TWO TABLETS BY MOUTH TWICE DAILY. 360 tablet 3  . LYRICA 75 MG capsule TAKE ONE CAPSULE BY MOUTH TWICE DAILY 60 capsule 3  . midodrine (PROAMATINE) 5 MG tablet Take 1 tablet by mouth in the 8 am and 1/2 tablet by mouth at 2 pm daily. 45 tablet 1  . pindolol (VISKEN) 5 MG tablet TAKE ONE-HALF TABLET BY MOUTH TWICE DAILY 30 tablet 9  . risperiDONE (RISPERDAL) 2 MG tablet Take by mouth. Take 1/2 tablet mouth in the am, 1/2 tab at lunch, 2 tab at night    . thiamine 100 MG tablet Take 100 mg by mouth daily.      . traZODone (DESYREL) 150 MG tablet Take 150 mg by mouth at bedtime.      No facility-administered medications prior to visit.    PAST MEDICAL HISTORY: Past Medical History  Diagnosis Date  . Allergy   . Anxiety   . Depression   . Hyperlipidemia   . Substance abuse   . Seizures ([place])     none for last 6-5month  . Hypertension, essential, benign 08/02/2012  . Orthostatic hypotension  10/09/2013  . OSA (obstructive sleep apnea) 10/01/2015    Severe OSA with an AHI of 86/hr now on BiPAP at 23/19cm H2O    PAST SURGICAL HISTORY: Past Surgical History  Procedure Laterality Date  . Fracture surgery      right hip and femur  . Joint replacement      right hip  . Tracheostomy  08/1999    Had pneumoniaand was in a coma 10 days  . Hernia repair  01/8249    umbilical hernia/ got infected    FAMILY HISTORY: Family History  Problem Relation Age of Onset  . Heart disease Mother   . Diabetes Father   . Heart disease Brother     SOCIAL HISTORY: Social History   Social History  . Marital Status: Single    Spouse Name: N/A  . Number of Children: N/A  . Years of  Education: N/A   Occupational History  . Not on file.   Social History Main Topics  . Smoking status: Current Every Day Smoker -- 0.30 packs/day for 30 years    Types: Cigarettes  . Smokeless tobacco: Never Used  . Alcohol Use: No     Comment: history of alcohol abuse.  . Drug Use: No  . Sexual Activity: Not Currently   Other Topics Concern  . Not on file   Social History Narrative     PHYSICAL EXAM  Filed Vitals:   10/30/15 0912 10/30/15 0921  BP: 132/80 100/70  Pulse:  84  Height: 6' (1.829 m)   Weight: 206 lb 6.4 oz (93.622 kg)    Body mass index is 27.99 kg/(m^2). General: The patient is alert and cooperative at the time of the examination. The patient is minimally obese. Skin: No significant peripheral edema is noted.  Neurologic Exam Mental status: The patient is oriented x 3. Cranial nerves: Facial symmetry is present. Speech is normal, no aphasia or dysarthria is noted. Extraocular movements are full. Visual fields are full. Motor: The patient has good strength in all 4 extremities.no focal weakness Sensory examination: Soft touch sensation is symmetric on the face, arms, and legs. Coordination: The patient has good finger-nose-finger and heel-to-shin bilaterally. Gait and station: The patient has a normal gait. Tandem gait is steady. Romberg is negative. No drift is seen. Reflexes: Deep tendon reflexes are symmetric.     DIAGNOSTIC DATA (LABS, IMAGING, TESTING) - I reviewed patient records, labs, notes, testing and imaging myself where available.  Lab Results  Component Value Date   WBC 12.6* 08/29/2015   HGB 13.3 08/29/2015   HCT 40.6 08/29/2015   MCV 80.4 08/29/2015   PLT 414* 08/29/2015      Component Value Date/Time   NA 140 08/18/2015 1118   K 3.2* 08/18/2015 1118   CL 107 08/18/2015 1118   CO2 24 08/18/2015 1118   GLUCOSE 89 08/18/2015 1118   BUN 1* 08/18/2015 1118   CREATININE 0.93 08/18/2015 1118   CREATININE 1.04 09/26/2013 0840    CALCIUM 8.4 08/18/2015 1118   PROT 6.2 09/26/2013 0840   ALBUMIN 3.8 09/26/2013 0840   AST 19 09/26/2013 0840   ALT 26 09/26/2013 0840   ALKPHOS 49 09/26/2013 0840   BILITOT 0.3 09/26/2013 0840   GFRNONAA 57 01/23/2010 0857   GFRAA  08/01/2009 0600    >60        The eGFR has been calculated using the MDRD equation. This calculation has not been validated in all clinical situations. eGFR's persistently <60 mL/min signify  possible Chronic Kidney Disease.   Lab Results  Component Value Date   CHOL 136 05/29/2015   HDL 23* 05/29/2015   LDLCALC 43 05/29/2015   TRIG 352* 05/29/2015   CHOLHDL 5.9 05/29/2015       ASSESSMENT AND PLAN  59 y.o. year old male  has a past medical history of  Substance abuse; Seizures ([place]); Hypertension, essential, benign (08/02/2012); Orthostatic hypotension (10/09/2013); and OSA (obstructive sleep apnea) (10/01/2015). Here to follow-up  Continue Keppra at current dose will refill Call for seizure activity F/U yearly and prn Dennie Bible, [facility], APRN  [facility] [address], [place], [place] [address] [phone]

## 2015-10-30 NOTE — Patient Instructions (Signed)
Continue Keppra at current dose will refill Call for seizure activity F/U yearly and prn  

## 2015-10-31 ENCOUNTER — Ambulatory Visit (INDEPENDENT_AMBULATORY_CARE_PROVIDER_SITE_OTHER): Payer: Medicare Other | Admitting: Internal Medicine

## 2015-10-31 ENCOUNTER — Encounter: Payer: Self-pay | Admitting: Internal Medicine

## 2015-10-31 VITALS — BP 146/72 | HR 94 | Ht 72.0 in | Wt 205.8 lb

## 2015-10-31 DIAGNOSIS — I951 Orthostatic hypotension: Secondary | ICD-10-CM | POA: Diagnosis not present

## 2015-10-31 LAB — CBC
HEMATOCRIT: 38.7 % — AB (ref 39.0–52.0)
HEMOGLOBIN: 12.8 g/dL — AB (ref 13.0–17.0)
MCH: 27.6 pg (ref 26.0–34.0)
MCHC: 33.1 g/dL (ref 30.0–36.0)
MCV: 83.4 fL (ref 78.0–100.0)
MPV: 8.9 fL (ref 8.6–12.4)
Platelets: 378 10*3/uL (ref 150–400)
RBC: 4.64 MIL/uL (ref 4.22–5.81)
RDW: 14.1 % (ref 11.5–15.5)
WBC: 8.8 10*3/uL (ref 4.0–10.5)

## 2015-10-31 LAB — BASIC METABOLIC PANEL
BUN: 2 mg/dL — AB (ref 7–25)
CHLORIDE: 106 mmol/L (ref 98–110)
CO2: 25 mmol/L (ref 20–31)
Calcium: 8.3 mg/dL — ABNORMAL LOW (ref 8.6–10.3)
Creat: 1.03 mg/dL (ref 0.70–1.33)
Glucose, Bld: 110 mg/dL — ABNORMAL HIGH (ref 65–99)
POTASSIUM: 3.6 mmol/L (ref 3.5–5.3)
SODIUM: 140 mmol/L (ref 135–146)

## 2015-10-31 MED ORDER — MIDODRINE HCL 5 MG PO TABS: ORAL_TABLET | ORAL | Status: DC

## 2015-10-31 NOTE — Progress Notes (Signed)
Cardiology Office Note   Date:  10/31/2015   ID:  Jose Li, DOB [birth year], MRN [id number]  PCP:  Almon Herculesaye T Gonfa, MD  Cardiologist:   Dietrich PatesPaula Davanta Meuser, MD   F/U of autonomic dysfunction     History of Present Illness: Jose Li is a 59 y.o. male with a history of dysautonomia Still having spells of dizziness  Last spell about a week ago  Went outside in AM  Came back in  [place] AFBFelt OK  Laid down  Then had swimmy feeling  Episode passed.   He says that he is drinking ample fluids       Current Outpatient Prescriptions  Medication Sig Dispense Refill  . aspirin 81 MG EC tablet Take 81 mg by mouth daily.      Marland Kitchen. atorvastatin (LIPITOR) 20 MG tablet Take 1 tablet (20 mg total) by mouth daily. 30 tablet 11  . benztropine (COGENTIN) 1 MG tablet Take 1 mg by mouth 2 (two) times daily.      . cetirizine (ZYRTEC) 10 MG tablet Take 1 tablet (10 mg total) by mouth daily. 30 tablet 9  . DULoxetine (CYMBALTA) 30 MG capsule Take 30 mg by mouth every evening. With Cymbalta 60 mg every morning.    . DULoxetine (CYMBALTA) 60 MG capsule Take 60 mg by mouth daily. 30 MG added in the afternoons    . ibuprofen (ADVIL,MOTRIN) 600 MG tablet TAKE ONE TABLET BY MOUTH EVERY 6 HOURS AS NEEDED FOR HEADACHE 30 tablet 3  . KLOR-CON M20 20 MEQ tablet Take 20 mEq by mouth 2 (two) times daily.     Marland Kitchen. levETIRAcetam (KEPPRA) 750 MG tablet TAKE TWO TABLETS BY MOUTH TWICE DAILY. 360 tablet 3  . LYRICA 75 MG capsule TAKE ONE CAPSULE BY MOUTH TWICE DAILY 60 capsule 3  . midodrine (PROAMATINE) 5 MG tablet Take 1 tablet by mouth in the 8 am and 1/2 tablet by mouth at 2 pm daily. 45 tablet 1  . pindolol (VISKEN) 5 MG tablet TAKE ONE-HALF TABLET BY MOUTH TWICE DAILY 30 tablet 9  . risperiDONE (RISPERDAL) 2 MG tablet Take by mouth. Take 1/2 tablet mouth in the am, 1/2 tab at lunch, 2 tab at night    . thiamine 100 MG tablet Take 100 mg by mouth daily.      . traZODone (DESYREL) 150 MG tablet Take 150 mg by mouth at bedtime.       No current facility-administered medications for this visit.    Allergies:   Erythromycin; Hctz; and Propranolol   Past Medical History  Diagnosis Date  . Allergy   . Anxiety   . Depression   . Hyperlipidemia   . Substance abuse   . Seizures (HCC)     none for last 6-767months  . Hypertension, essential, benign 08/02/2012  . Orthostatic hypotension 10/09/2013  . OSA (obstructive sleep apnea) 10/01/2015    Severe OSA with an AHI of 86/hr now on BiPAP at 23/19cm H2O    Past Surgical History  Procedure Laterality Date  . Fracture surgery      right hip and femur  . Joint replacement      right hip  . Tracheostomy  08/1999    Had pneumoniaand was in a coma 10 days  . Hernia repair  07/2009    umbilical hernia/ got infected     Social History:  The patient  reports that he has been smoking Cigarettes.  He has a 9 pack-year smoking history.  He has never used smokeless tobacco. He reports that he does not drink alcohol or use illicit drugs.   Family History:  The patient's family history includes Diabetes in his father; Heart disease in his brother and mother.    ROS:  Please see the history of present illness. All other systems are reviewed and  Negative to the above problem except as noted.    PHYSICAL EXAM: VS:  BP 146/72 mmHg  Pulse 94  Ht 6' (1.829 m)  Wt 93.35 kg (205 lb 12.8 oz)  BMI 27.91 kg/m2  SpO2 99%  GEN: Well nourished, well developed, in no acute distress HEENT: normal Neck: no JVD, carotid bruits, or masses Cardiac: RRR; no murmurs, rubs, or gallops,no edema  Respiratory:  clear to auscultation bilaterally, normal work of breathing GI: soft, nontender, nondistended, + BS  No hepatomegaly  MS: no deformity Moving all extremities   Skin: warm and dry, no rash Neuro:  Strength and sensation are intact Psych: euthymic mood, full affect   EKG:  EKG is not  ordered today.   Lipid Panel    Component Value Date/Time   CHOL 136 05/29/2015 1447   TRIG  352* 05/29/2015 1447   HDL 23* 05/29/2015 1447   CHOLHDL 5.9 05/29/2015 1447   VLDL 70* 05/29/2015 1447   LDLCALC 43 05/29/2015 1447      Wt Readings from Last 3 Encounters:  10/31/15 93.35 kg (205 lb 12.8 oz)  10/30/15 93.622 kg (206 lb 6.4 oz)  10/01/15 94.802 kg (209 lb)      ASSESSMENT AND PLAN: 1  Autonomic dysfunction  Continues to have dizziness  BP dropped on orthostatic check yesterday  I would recomm increasing midodrine to 5/ 2.5/2.5 mg per day   Check BMET and CBC today   F/U in clinic in 2 months    Signed, Dietrich Pates, MD  10/31/2015 8:32 AM    [facility] [address] [place], [place], [place]  [address] Phone: [phone]; Fax: [phone]

## 2015-10-31 NOTE — Patient Instructions (Signed)
Your physician has recommended you make the following change in your medication:  1.) increase midodrine 5 mg to 1 tablet at 6 am, 1/2 tablet at 10 am, 1/2 tablet at 2 pm Your physician recommends that you return for lab work today (BMET, CBC) Your physician recommends that you schedule a follow-up appointment in: 2 MONTHS WITH DR. Tenny CrawOSS

## 2015-11-04 ENCOUNTER — Encounter: Payer: Self-pay | Admitting: Cardiology

## 2015-11-18 DIAGNOSIS — F3132 Bipolar disorder, current episode depressed, moderate: Secondary | ICD-10-CM | POA: Diagnosis not present

## 2015-11-19 DIAGNOSIS — G4733 Obstructive sleep apnea (adult) (pediatric): Secondary | ICD-10-CM | POA: Diagnosis not present

## 2015-11-20 ENCOUNTER — Other Ambulatory Visit: Payer: Self-pay | Admitting: Student

## 2015-11-20 DIAGNOSIS — R519 Headache, unspecified: Secondary | ICD-10-CM

## 2015-11-20 DIAGNOSIS — R51 Headache: Principal | ICD-10-CM

## 2015-12-11 ENCOUNTER — Other Ambulatory Visit: Payer: Self-pay | Admitting: Student

## 2015-12-16 DIAGNOSIS — G4733 Obstructive sleep apnea (adult) (pediatric): Secondary | ICD-10-CM | POA: Diagnosis not present

## 2015-12-29 ENCOUNTER — Ambulatory Visit (INDEPENDENT_AMBULATORY_CARE_PROVIDER_SITE_OTHER): Payer: Medicare Other | Admitting: Student

## 2015-12-29 ENCOUNTER — Encounter: Payer: Self-pay | Admitting: Student

## 2015-12-29 VITALS — BP 132/85 | HR 75 | Temp 98.0°F | Ht 71.0 in | Wt 202.4 lb

## 2015-12-29 DIAGNOSIS — F17200 Nicotine dependence, unspecified, uncomplicated: Secondary | ICD-10-CM | POA: Diagnosis not present

## 2015-12-29 DIAGNOSIS — R7303 Prediabetes: Secondary | ICD-10-CM

## 2015-12-29 DIAGNOSIS — R42 Dizziness and giddiness: Secondary | ICD-10-CM

## 2015-12-29 DIAGNOSIS — Z0000 Encounter for general adult medical examination without abnormal findings: Secondary | ICD-10-CM

## 2015-12-29 DIAGNOSIS — I10 Essential (primary) hypertension: Secondary | ICD-10-CM

## 2015-12-29 DIAGNOSIS — E785 Hyperlipidemia, unspecified: Secondary | ICD-10-CM | POA: Diagnosis not present

## 2015-12-29 LAB — POCT GLYCOSYLATED HEMOGLOBIN (HGB A1C): HEMOGLOBIN A1C: 5.6

## 2015-12-29 MED ORDER — PNEUMOCOCCAL VAC POLYVALENT 25 MCG/0.5ML IJ INJ
0.5000 mL | INJECTION | INTRAMUSCULAR | Status: AC
  Administered 2015-12-29: 0.5 mL via INTRAMUSCULAR

## 2015-12-29 NOTE — Patient Instructions (Signed)
It was great seeing you today! We have addressed the following issues today  1. Smoking: I recommend you quit smoking as this has a lot of impact on your health. I would be happy to talk the options we have to help you with this whenever you are ready to quit smoking. You can also call this number to talk to professional counselor. Call 1800-QUIT-NOW for help with stopping smoking. 2. General health: continue exercise (walking). Below are some guides on type of exercise.     If we did any lab work today, and the results require attention, either me or my nurse will get in touch with you. If everything is normal, you will get a letter in mail. If you don't hear from [place] in two weeks, please give [place] a call. Otherwise, I look forward to talking with you again at our next visit. If you have any questions or concerns before then, please call the clinic at [phone].  Please bring all your medications to every doctors visit   Sign up for My Chart to have easy access to your labs results, and communication with your Primary care physician.    Please check-out at the front desk before leaving the clinic.   Take Care,       Exercise Guide:

## 2015-12-29 NOTE — Assessment & Plan Note (Signed)
Unclear what is causing this. Doesn't appear to be BPPV based on his description. No tinnitus. Ear exam normal. Association with gravity makes orthostatic hypotension likely. He is on multiple medications (reperisone, Lyrica, pindolol and Cymbalta) that could cause significant dizziness  -advised to keep good hydration -Encouraged to get up slowly (step wise) to prevent fall

## 2015-12-29 NOTE — Assessment & Plan Note (Addendum)
Chronic conditions: -Hypertension: at goal. Will check Ac today. -Bipolar disorder: stable. being followed at [facility]. -Hyperlipidemia: will get lipid panel today  Screenings -Colonoscopy-uptodate -HIV and Hep C  Vaccination: -reports having flu vaccine three months ago. However, this is not found in chart -PPSV-23 given smoking history.  Exercise: -recommend 150 minutes of moderate intensity exercise every week and gave handout  Diet -Discussed about nutrition particularly portion size and regular meals and gave handout  Smoking: -Advised to quit smoking. Patient is not ready to quit at this time. Gave quit line number.

## 2015-12-29 NOTE — Progress Notes (Signed)
Subjective:    Patient ID: Jose Li, male    DOB: 23-Feb-1956, 60 y.o.   MRN: [id number]  HPI No concern today. Taking his medication. Denies side effect except for occasional vertiginous dizziness when he gets up from sitting. Denies vertigo when he turn in bed.  Goes to [facility] every 6 months for bipolar disorder. His next apt is 01/16/2016. Takes his medication. Reports good compliance with his medication.   Smoking: 20-pack-year smoking history. He is not ready to quit today. He reports some success in the past. He had quit for 7 years at one point.  Exercise: he [place] around [place]". He has lost about 30 lbs in the last one year.  Lives with father (90 years). Denies EtOH or recreational drug use.  Review of Systems  Constitutional: Positive for weight loss. Negative for fever, chills, malaise/fatigue and diaphoresis.  HENT: Negative for ear discharge, ear pain, hearing loss, sore throat and tinnitus.        History of migraine headache  Eyes: Negative for blurred vision.  Respiratory: Negative for cough, hemoptysis, sputum production, shortness of breath and wheezing.   Cardiovascular: Positive for leg swelling. Negative for chest pain, palpitations, orthopnea and PND.       Chronic right leg swelling  Gastrointestinal: Negative for heartburn, vomiting, diarrhea, constipation and blood in stool.  Genitourinary: Negative for dysuria and hematuria.  Musculoskeletal: Negative for myalgias, back pain, joint pain and neck pain.  Skin: Negative for itching and rash.  Neurological: Positive for dizziness and headaches. Negative for tingling, sensory change, speech change, focal weakness, seizures and weakness.       Occasional vertigo  Endo/Heme/Allergies: Negative for polydipsia. Does not bruise/bleed easily.  Psychiatric/Behavioral: Negative for depression, suicidal ideas and hallucinations. The patient is not nervous/anxious.    Objective:   Physical  Exam Filed Vitals:   12/29/15 1035  BP: 132/85  Pulse: 75  Temp: 98 F (36.7 C)  TempSrc: Oral  Height:  (1.803 m)  Weight: 202 lb 6.4 oz (91.808 kg)   Gen: appears well, no acute distress Eyes: pupils equal, round and reactive to light Ears: TM's and ear canals appear normal Nares: clear, no erythema, swelling or congestion Oropharynx: wears denture, clear, moist Neck: supple, no LAD CV: regular rate and rythm. S1 & S2 audible, no murmurs. Resp: no apparent work of breathing, clear to auscultation bilaterally. GI: bowel sounds normal, no tenderness to palpation, no rebound or guarding, no mass.  GU: no suprapubic tenderness Skin: no lesion MSK: RE: 1+ pitting edema to knee level LE: trace edema. No joint swelling. Neuro:  Mental status: AAO x4  Speech: normal Psych: appropriate, normal affect, appropriately dressed.    Assessment & Plan:  Routine history and physical examination of adult Chronic conditions: -Hypertension: at goal. Will check Ac today. -Bipolar disorder: stable. being followed at [facility]. -Hyperlipidemia: will get lipid panel today  Screenings -Colonoscopy-uptodate -HIV and Hep C  Vaccination: -reports having flu vaccine three months ago. However, this is not found in chart -PPSV-23 given smoking history.  Exercise: -recommend 150 minutes of moderate intensity exercise every week and gave handout  Diet -Discussed about nutrition particularly portion size and regular meals and gave handout  Smoking: -Advised to quit smoking. Patient is not ready to quit at this time. Gave quit line number.   Vertigo Unclear what is causing this. Doesn't appear to be BPPV based on his description. No tinnitus. Ear exam normal. Association with gravity  makes orthostatic hypotension likely. He is on multiple medications (reperisone, Lyrica, pindolol and Cymbalta) that could cause significant dizziness  -advised to keep good hydration -Encouraged to get up  slowly (step wise) to prevent fall

## 2015-12-30 ENCOUNTER — Encounter: Payer: Self-pay | Admitting: Student

## 2015-12-30 ENCOUNTER — Telehealth: Payer: Self-pay | Admitting: Student

## 2015-12-30 DIAGNOSIS — E785 Hyperlipidemia, unspecified: Secondary | ICD-10-CM

## 2015-12-30 LAB — LIPID PANEL
CHOLESTEROL: 176 mg/dL (ref 125–200)
HDL: 33 mg/dL — ABNORMAL LOW (ref >=40)
LDL Cholesterol: 64 mg/dL (ref <130)
TRIGLYCERIDES: 393 mg/dL — AB (ref <150)
Total CHOL/HDL Ratio: 5.3 Ratio — ABNORMAL HIGH (ref <=5.0)
VLDL: 79 mg/dL — AB (ref <30)

## 2015-12-30 MED ORDER — ATORVASTATIN CALCIUM 40 MG PO TABS: 20.0000 mg | ORAL_TABLET | Freq: Every day | ORAL | Status: DC

## 2015-12-30 NOTE — Telephone Encounter (Signed)
Called and discussed patient's lipid panel. ASCVD score 15%. We have discussed about increasing his Lipitor to 40 mg, which he is okay with. I have sent a prescription to his pharmacy.

## 2016-01-01 ENCOUNTER — Other Ambulatory Visit: Payer: Self-pay | Admitting: Internal Medicine

## 2016-01-04 NOTE — Progress Notes (Signed)
Cardiology Office Note   Date:  01/05/2016   ID:  Jose Li, DOB 10-29-1956, MRN [id number]  PCP:  Almon Hercules, MD  Cardiologist:   Dietrich Pates, MD   No chief complaint on file.  F/U of autonomic dysfunction     History of Present Illness: Jose Li is a 60 y.o. male with a history of autonomic dysfunction  I saw him in December  I recomm increasing midodrine to 5/ 2.5/2.5   Since seen he has still had dizziness  Near syncopal spell yesterday  Was sitting for 2 hours watching TV  Got up to get drink  Felt very dizzy        Current Outpatient Prescriptions  Medication Sig Dispense Refill  . aspirin 81 MG EC tablet Take 81 mg by mouth daily.      Marland Kitchen atorvastatin (LIPITOR) 40 MG tablet Take 0.5 tablets (20 mg total) by mouth daily. 30 tablet 11  . benztropine (COGENTIN) 0.5 MG tablet     . benztropine (COGENTIN) 1 MG tablet Take 3 mg by mouth 3 (three) times daily. TAKE 1/2 TAB  AM, TAKE 1/2 TAB,  10 AM  TAKE 2 MG @ BEDTIME TOTAL OF 3 MG    . cetirizine (ZYRTEC) 10 MG tablet Take 1 tablet (10 mg total) by mouth daily. 30 tablet 9  . DULoxetine (CYMBALTA) 30 MG capsule Take 30 mg by mouth every evening. With Cymbalta 60 mg every morning.    . DULoxetine (CYMBALTA) 60 MG capsule Take 60 mg by mouth daily. 30 MG added in the afternoons    . ibuprofen (ADVIL,MOTRIN) 600 MG tablet TAKE ONE TABLET BY MOUTH TWICE DAILY AS NEEDED FOR HEADACHE OR MODERATE PAIN 30 tablet 0  . levETIRAcetam (KEPPRA) 750 MG tablet TAKE TWO TABLETS BY MOUTH TWICE DAILY. 360 tablet 3  . LYRICA 75 MG capsule TAKE ONE CAPSULE BY MOUTH TWICE DAILY 60 capsule 3  . midodrine (PROAMATINE) 5 MG tablet Take 1 tablet by mouth at 6 am and 1 tablet 10 am and 1 tablet at 2 pm daily. 90 tablet 11  . pindolol (VISKEN) 5 MG tablet TAKE ONE-HALF TABLET BY MOUTH TWICE DAILY 30 tablet 9  . potassium chloride SA (KLOR-CON M20) 20 MEQ tablet TAKE TWO TABLETS BY MOUTH IN THE AM & TAKE ONE TABLET BY MOUTH IN THE PM. 270 tablet  2  . risperiDONE (RISPERDAL) 2 MG tablet Take by mouth. Take 1/2 tablet mouth in the am, 1/2 tab at lunch, 2 tab at night    . thiamine 100 MG tablet Take 100 mg by mouth daily.      . traZODone (DESYREL) 150 MG tablet Take 150 mg by mouth at bedtime.      No current facility-administered medications for this visit.    Allergies:   Erythromycin; Hctz; and Propranolol   Past Medical History  Diagnosis Date  . Allergy   . Anxiety   . Depression   . Hyperlipidemia   . Substance abuse   . Seizures (HCC)     none for last 6-48months  . Hypertension, essential, benign 08/02/2012  . Orthostatic hypotension 10/09/2013  . OSA (obstructive sleep apnea) 10/01/2015    Severe OSA with an AHI of 86/hr now on BiPAP at 23/19cm H2O    Past Surgical History  Procedure Laterality Date  . Fracture surgery      right hip and femur  . Joint replacement      right hip  .  Tracheostomy  08/1999    Had pneumoniaand was in a coma 10 days  . Hernia repair  07/2009    umbilical hernia/ got infected     Social History:  The patient  reports that he has been smoking Cigarettes.  He has a 9 pack-year smoking history. He has never used smokeless tobacco. He reports that he does not drink alcohol or use illicit drugs.   Family History:  The patient's family history includes Diabetes in his father; Heart attack in his brother; Heart disease in his brother and mother. There is no history of Hypertension or Stroke.    ROS:  Please see the history of present illness. All other systems are reviewed and  Negative to the above problem except as noted.    PHYSICAL EXAM: VS:  BP 100/75 mmHg  Pulse 84  Ht  (1.803 m)  Wt 91.536 kg (201 lb 12.8 oz)  BMI 28.16 kg/m2  GEN: Well nourished, well developed, in no acute distress HEENT: normal Neck: no JVD, carotid bruits, or masses Cardiac: RRR; no murmurs, rubs, or gallops,no edema  Respiratory:  clear to auscultation bilaterally, normal work of  breathing GI: soft, nontender, nondistended, + BS  No hepatomegaly  MS: no deformity Moving all extremities   Skin: warm and dry, no rash Neuro:  Strength and sensation are intact Psych: euthymic mood, full affect   EKG:  EKG is ordered today.  SR 79 bpm     Lipid Panel    Component Value Date/Time   CHOL 176 12/29/2015 1146   TRIG 393* 12/29/2015 1146   HDL 33* 12/29/2015 1146   CHOLHDL 5.3* 12/29/2015 1146   VLDL 79* 12/29/2015 1146   LDLCALC 64 12/29/2015 1146      Wt Readings from Last 3 Encounters:  01/05/16 91.536 kg (201 lb 12.8 oz)  12/29/15 91.808 kg (202 lb 6.4 oz)  10/31/15 93.35 kg (205 lb 12.8 oz)      ASSESSMENT AND PLAN:  1  Autonomic dysfunction  Would recomm increasing midodrine to 5 mg tid  Continue salt/fluid intake  F/U in 4 to 6 wks    2.  Hx hypokalemia  Will check BMET today    Signed, Dietrich Pates, MD  01/05/2016 12:22 PM    [facility] [address] [place], [place], [place]  [address] Phone: [phone]; Fax: [phone]

## 2016-01-05 ENCOUNTER — Telehealth: Payer: Self-pay | Admitting: Student

## 2016-01-05 ENCOUNTER — Encounter: Payer: Self-pay | Admitting: Internal Medicine

## 2016-01-05 ENCOUNTER — Ambulatory Visit (INDEPENDENT_AMBULATORY_CARE_PROVIDER_SITE_OTHER): Payer: Medicare Other | Admitting: Internal Medicine

## 2016-01-05 VITALS — BP 100/75 | HR 84 | Ht 71.0 in | Wt 201.8 lb

## 2016-01-05 DIAGNOSIS — I951 Orthostatic hypotension: Secondary | ICD-10-CM

## 2016-01-05 LAB — BASIC METABOLIC PANEL
BUN: 3 mg/dL — AB (ref 7–25)
CHLORIDE: 101 mmol/L (ref 98–110)
CO2: 25 mmol/L (ref 20–31)
CREATININE: 1.16 mg/dL (ref 0.70–1.33)
Calcium: 8.6 mg/dL (ref 8.6–10.3)
GLUCOSE: 78 mg/dL (ref 65–99)
Potassium: 4.8 mmol/L (ref 3.5–5.3)
Sodium: 134 mmol/L — ABNORMAL LOW (ref 135–146)

## 2016-01-05 MED ORDER — MIDODRINE HCL 5 MG PO TABS: ORAL_TABLET | ORAL | Status: DC

## 2016-01-05 NOTE — Telephone Encounter (Signed)
Pt called because he was seen last week and the doctor increased his Lipitor to 40 mg a day. He picked this up and the bottle is 40 mg but the directions are still the same for 20 mg. Can someone call him to let him know what he is suppose to be taking. jw

## 2016-01-05 NOTE — Patient Instructions (Signed)
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 Tenny Craw.

## 2016-01-06 ENCOUNTER — Other Ambulatory Visit: Payer: Self-pay | Admitting: Student

## 2016-01-06 DIAGNOSIS — E785 Hyperlipidemia, unspecified: Secondary | ICD-10-CM

## 2016-01-06 MED ORDER — ATORVASTATIN CALCIUM 40 MG PO TABS: 40.0000 mg | ORAL_TABLET | Freq: Every day | ORAL | Status: DC

## 2016-01-06 NOTE — Telephone Encounter (Signed)
Dr. Alanda Slim took care of this by contacting the pt. Jose Li, Jose Li, [place]

## 2016-01-06 NOTE — Progress Notes (Signed)
Called and talked to the patient about his Lipitor. Patient called because I sent in a prescription for Lipitor 20 mg by mistake about a week ago after I told him to increase his dose to 40 mg. I have apologized for the confusion and advised him to take 40 mg daily. I have also sent a new prescription for 40 mg. Patient appreciated the call and agreed to do as advised.

## 2016-01-07 ENCOUNTER — Telehealth: Payer: Self-pay | Admitting: Internal Medicine

## 2016-01-07 NOTE — Telephone Encounter (Signed)
Pt would like his lab results from Monday. °

## 2016-01-07 NOTE — Telephone Encounter (Signed)
Pt is aware of lab results. Pt verbalized understanding. 

## 2016-01-13 DIAGNOSIS — G4733 Obstructive sleep apnea (adult) (pediatric): Secondary | ICD-10-CM | POA: Diagnosis not present

## 2016-01-20 ENCOUNTER — Other Ambulatory Visit: Payer: Self-pay | Admitting: Student

## 2016-01-27 ENCOUNTER — Encounter (HOSPITAL_COMMUNITY): Payer: Self-pay | Admitting: *Deleted

## 2016-01-27 ENCOUNTER — Telehealth: Payer: Self-pay | Admitting: Internal Medicine

## 2016-01-27 ENCOUNTER — Inpatient Hospital Stay (HOSPITAL_COMMUNITY)
Admission: EM | Admit: 2016-01-27 | Discharge: 2016-01-28 | DRG: 083 | Disposition: A | Discharge status: Home Health | Payer: Medicare Other | Attending: General Surgery | Admitting: General Surgery

## 2016-01-27 ENCOUNTER — Emergency Department (HOSPITAL_COMMUNITY): Payer: Medicare Other

## 2016-01-27 DIAGNOSIS — S52514A Nondisplaced fracture of right radial styloid process, initial encounter for closed fracture: Secondary | ICD-10-CM | POA: Diagnosis not present

## 2016-01-27 DIAGNOSIS — Z7982 Long term (current) use of aspirin: Secondary | ICD-10-CM

## 2016-01-27 DIAGNOSIS — Z881 Allergy status to other antibiotic agents status: Secondary | ICD-10-CM | POA: Diagnosis not present

## 2016-01-27 DIAGNOSIS — E785 Hyperlipidemia, unspecified: Secondary | ICD-10-CM | POA: Diagnosis not present

## 2016-01-27 DIAGNOSIS — R51 Headache: Secondary | ICD-10-CM | POA: Diagnosis present

## 2016-01-27 DIAGNOSIS — R402414 Glasgow coma scale score 13-15, 24 hours or more after hospital admission: Secondary | ICD-10-CM | POA: Diagnosis not present

## 2016-01-27 DIAGNOSIS — G4733 Obstructive sleep apnea (adult) (pediatric): Secondary | ICD-10-CM | POA: Diagnosis not present

## 2016-01-27 DIAGNOSIS — Z79899 Other long term (current) drug therapy: Secondary | ICD-10-CM | POA: Diagnosis not present

## 2016-01-27 DIAGNOSIS — R0781 Pleurodynia: Secondary | ICD-10-CM

## 2016-01-27 DIAGNOSIS — F17210 Nicotine dependence, cigarettes, uncomplicated: Secondary | ICD-10-CM | POA: Diagnosis present

## 2016-01-27 DIAGNOSIS — S0101XA Laceration without foreign body of scalp, initial encounter: Secondary | ICD-10-CM

## 2016-01-27 DIAGNOSIS — Z9181 History of falling: Secondary | ICD-10-CM

## 2016-01-27 DIAGNOSIS — S065X9A Traumatic subdural hemorrhage with loss of consciousness of unspecified duration, initial encounter: Principal | ICD-10-CM | POA: Diagnosis present

## 2016-01-27 DIAGNOSIS — I10 Essential (primary) hypertension: Secondary | ICD-10-CM | POA: Diagnosis not present

## 2016-01-27 DIAGNOSIS — Z888 Allergy status to other drugs, medicaments and biological substances status: Secondary | ICD-10-CM | POA: Diagnosis not present

## 2016-01-27 DIAGNOSIS — S2242XA Multiple fractures of ribs, left side, initial encounter for closed fracture: Secondary | ICD-10-CM | POA: Diagnosis not present

## 2016-01-27 DIAGNOSIS — R1011 Right upper quadrant pain: Secondary | ICD-10-CM | POA: Diagnosis not present

## 2016-01-27 DIAGNOSIS — Z96641 Presence of right artificial hip joint: Secondary | ICD-10-CM | POA: Diagnosis not present

## 2016-01-27 DIAGNOSIS — G40909 Epilepsy, unspecified, not intractable, without status epilepticus: Secondary | ICD-10-CM | POA: Diagnosis present

## 2016-01-27 DIAGNOSIS — W109XXA Fall (on) (from) unspecified stairs and steps, initial encounter: Secondary | ICD-10-CM | POA: Diagnosis present

## 2016-01-27 DIAGNOSIS — S2232XA Fracture of one rib, left side, initial encounter for closed fracture: Secondary | ICD-10-CM | POA: Diagnosis not present

## 2016-01-27 DIAGNOSIS — S065XAA Traumatic subdural hemorrhage with loss of consciousness status unknown, initial encounter: Secondary | ICD-10-CM | POA: Diagnosis present

## 2016-01-27 DIAGNOSIS — S065X0A Traumatic subdural hemorrhage without loss of consciousness, initial encounter: Secondary | ICD-10-CM | POA: Diagnosis not present

## 2016-01-27 DIAGNOSIS — W19XXXA Unspecified fall, initial encounter: Secondary | ICD-10-CM

## 2016-01-27 LAB — CBC
HCT: 35.9 % — ABNORMAL LOW (ref 39.0–52.0)
HEMOGLOBIN: 12.1 g/dL — AB (ref 13.0–17.0)
MCH: 27.5 pg (ref 26.0–34.0)
MCHC: 33.7 g/dL (ref 30.0–36.0)
MCV: 81.6 fL (ref 78.0–100.0)
Platelets: 385 10*3/uL (ref 150–400)
RBC: 4.4 MIL/uL (ref 4.22–5.81)
RDW: 12.9 % (ref 11.5–15.5)
WBC: 15.6 10*3/uL — ABNORMAL HIGH (ref 4.0–10.5)

## 2016-01-27 LAB — BASIC METABOLIC PANEL
ANION GAP: 12 (ref 5–15)
BUN: <5 mg/dL — ABNORMAL LOW (ref 6–20)
CHLORIDE: 97 mmol/L — AB (ref 101–111)
CO2: 24 mmol/L (ref 22–32)
CREATININE: 1.37 mg/dL — AB (ref 0.61–1.24)
Calcium: 8.9 mg/dL (ref 8.9–10.3)
GFR calc non Af Amer: 55 mL/min — ABNORMAL LOW (ref >60)
GLUCOSE: 82 mg/dL (ref 65–99)
Potassium: 4.2 mmol/L (ref 3.5–5.1)
Sodium: 133 mmol/L — ABNORMAL LOW (ref 135–145)

## 2016-01-27 LAB — URINALYSIS, ROUTINE W REFLEX MICROSCOPIC
BILIRUBIN URINE: NEGATIVE
GLUCOSE, UA: NEGATIVE mg/dL
HGB URINE DIPSTICK: NEGATIVE
Ketones, ur: NEGATIVE mg/dL
Leukocytes, UA: NEGATIVE
Nitrite: NEGATIVE
Protein, ur: NEGATIVE mg/dL
SPECIFIC GRAVITY, URINE: 1.024 (ref 1.005–1.030)
pH: 5 (ref 5.0–8.0)

## 2016-01-27 MED ORDER — LIDOCAINE-EPINEPHRINE (PF) 2 %-1:200000 IJ SOLN
10.0000 mL | Freq: Once | INTRAMUSCULAR | Status: AC
  Administered 2016-01-27: 10 mL
  Filled 2016-01-27: qty 20

## 2016-01-27 MED ORDER — SODIUM CHLORIDE 0.9 % IV BOLUS (SEPSIS)
1000.0000 mL | Freq: Once | INTRAVENOUS | Status: AC
  Administered 2016-01-27: 1000 mL via INTRAVENOUS

## 2016-01-27 MED ORDER — TETANUS-DIPHTH-ACELL PERTUSSIS 5-2.5-18.5 LF-MCG/0.5 IM SUSP
0.5000 mL | Freq: Once | INTRAMUSCULAR | Status: AC
  Administered 2016-01-28: 0.5 mL via INTRAMUSCULAR
  Filled 2016-01-27: qty 0.5

## 2016-01-27 NOTE — ED Notes (Signed)
Notified CT patient has IV and is ready for transport.

## 2016-01-27 NOTE — ED Notes (Signed)
Patient transported to CT 

## 2016-01-27 NOTE — ED Notes (Signed)
MD at bedside suturing patient.

## 2016-01-27 NOTE — ED Notes (Signed)
Pt states he fell down 24 steps on Sunday after passing out. Pt is complaining of bilateral rib pain. Pt appears pale.

## 2016-01-27 NOTE — Telephone Encounter (Signed)
Spoke with pt and he states that he has passed out 3-4 times each day for the last 2-3 days. Pt says he has been having episodes since Midodrine was changed. Asked pt if he has hit his head at all when he has fallen and he said yes. Pt noted to have some slurring in his speech. Advised pt that he needed to call 911 and proceed to the ER for evaluation. Pt states that he will have his dad bring him because they can get there faster. Asked to speak with pt's father and he would not give him the phone. Highly encouraged EMS and pt said he would just have dad bring him.

## 2016-01-27 NOTE — ED Provider Notes (Signed)
CSN: [id number]     Arrival date & time 01/27/16  1301 History   First MD Initiated Contact with Patient 01/27/16 1953     Chief Complaint  Patient presents with  . Loss of Consciousness  . Fall     (Consider location/radiation/quality/duration/timing/severity/associated sxs/prior Treatment) HPI Jose Li is a 60 year old man with a history of autonomic dysfunction and orthostatic hypotension. He has had multiple falls which occur on a regular basis for him. He states he has had some increase in falls recently. Several days ago he states that he fell down multiple steps. He is complaining of bilateral rib pain had also an injury to his head. Patient states that he injured his has little laceration 2 days ago. He has not done any wound care. Past Medical History  Diagnosis Date  . Allergy   . Anxiety   . Depression   . Hyperlipidemia   . Substance abuse   . Seizures (HCC)     none for last 6-13months  . Hypertension, essential, benign 08/02/2012  . Orthostatic hypotension 10/09/2013  . OSA (obstructive sleep apnea) 10/01/2015    Severe OSA with an AHI of 86/hr now on BiPAP at 23/19cm H2O   Past Surgical History  Procedure Laterality Date  . Fracture surgery      right hip and femur  . Joint replacement      right hip  . Tracheostomy  08/1999    Had pneumoniaand was in a coma 10 days  . Hernia repair  07/2009    umbilical hernia/ got infected   Family History  Problem Relation Age of Onset  . Heart disease Mother   . Diabetes Father   . Heart disease Brother   . Heart attack Brother   . Hypertension Neg Hx   . Stroke Neg Hx    Social History  Substance Use Topics  . Smoking status: Current Every Day Smoker -- 0.30 packs/day for 30 years    Types: Cigarettes  . Smokeless tobacco: Never Used  . Alcohol Use: No     Comment: history of alcohol abuse.    Review of Systems    Allergies  Erythromycin; Hctz; and Propranolol  Home Medications   Prior to Admission  medications   Medication Sig Start Date End Date Taking? Authorizing Provider  aspirin 81 MG EC tablet Take 81 mg by mouth daily.      Historical Provider, MD  atorvastatin (LIPITOR) 40 MG tablet Take 1 tablet (40 mg total) by mouth daily. 01/06/16   Almon Hercules, MD  benztropine (COGENTIN) 0.5 MG tablet  01/01/16   Historical Provider, MD  benztropine (COGENTIN) 1 MG tablet Take 3 mg by mouth 3 (three) times daily. TAKE 1/2 TAB  AM, TAKE 1/2 TAB,  10 AM  TAKE 2 MG @ BEDTIME TOTAL OF 3 MG    Historical Provider, MD  cetirizine (ZYRTEC) 10 MG tablet Take 1 tablet (10 mg total) by mouth daily. 07/18/15   Almon Hercules, MD  DULoxetine (CYMBALTA) 30 MG capsule Take 30 mg by mouth every evening. With Cymbalta 60 mg every morning.    Historical Provider, MD  DULoxetine (CYMBALTA) 60 MG capsule Take 60 mg by mouth daily. 30 MG added in the afternoons    Historical Provider, MD  ibuprofen (ADVIL,MOTRIN) 600 MG tablet TAKE ONE TABLET BY MOUTH TWICE DAILY AS NEEDED FOR HEADACHE OR MODERATE PAIN 01/20/16   Almon Hercules, MD  levETIRAcetam (KEPPRA) 750 MG tablet TAKE TWO TABLETS  BY MOUTH TWICE DAILY. 10/30/15   Nilda RiggsNancy Carolyn Martin, NP  LYRICA 75 MG capsule TAKE ONE CAPSULE BY MOUTH TWICE DAILY 10/15/15   York Spanielharles K Willis, MD  midodrine (PROAMATINE) 5 MG tablet Take 1 tablet by mouth at 6 am and 1 tablet 10 am and 1 tablet at 2 pm daily. 01/05/16   Pricilla RifflePaula Ross V, MD  pindolol (VISKEN) 5 MG tablet TAKE ONE-HALF TABLET BY MOUTH TWICE DAILY 10/03/15   Pricilla RifflePaula Ross V, MD  potassium chloride SA (KLOR-CON M20) 20 MEQ tablet TAKE TWO TABLETS BY MOUTH IN THE AM & TAKE ONE TABLET BY MOUTH IN THE PM. 01/01/16   Pricilla RifflePaula Ross V, MD  risperiDONE (RISPERDAL) 2 MG tablet Take by mouth. Take 1/2 tablet mouth in the am, 1/2 tab at lunch, 2 tab at night    Historical Provider, MD  thiamine 100 MG tablet Take 100 mg by mouth daily.      Historical Provider, MD  traZODone (DESYREL) 150 MG tablet Take 150 mg by mouth at bedtime.  09/24/15    Historical Provider, MD   BP 127/73 mmHg  Pulse 86  Temp(Src) 97.8 F (36.6 C) (Oral)  Resp 20  SpO2 98% Physical Exam  ED Course  US bedside Date/Time: 01/27/2016 11:09 PM Performed by: Margarita GrizzleAY, Joylynn Defrancesco Authorized by: Margarita GrizzleAY, Tashari Schoenfelder Consent: Verbal consent obtained. Patient tolerance: Patient tolerated the procedure well with no immediate complications Comments: Right antecubital 20 gauge catheter inserted with us guidance.    (including critical care time) Labs Review Labs Reviewed  BASIC METABOLIC PANEL - Abnormal; Notable for the following:    Sodium 133 (*)    Chloride 97 (*)    BUN <5 (*)    Creatinine, Ser 1.37 (*)    GFR calc non Af Amer 55 (*)    All other components within normal limits  CBC - Abnormal; Notable for the following:    WBC 15.6 (*)    Hemoglobin 12.1 (*)    HCT 35.9 (*)    All other components within normal limits  URINALYSIS, ROUTINE W REFLEX MICROSCOPIC (NOT AT [facility])    Imaging Review Dg Ribs Bilateral W/chest  01/27/2016  CLINICAL DATA:  Initial encounter for FELL DOWN STEPS ON Saturday,PAIN LOWER ANTERIOR BILATERAL RIBS.LEFT > RIGHT EXAM: BILATERAL RIBS AND CHEST - 4+ VIEW COMPARISON:  Chest radiograph 07/26/2009 FINDINGS: Frontal view of the chest and multiple views of bilateral ribs. Frontal view of the chest demonstrates sixth posterior lateral left rib remote trauma. Midline trachea. Normal heart size and mediastinal contours. No pleural effusion or pneumothorax. Mild biapical pleural thickening. Clear lungs. Dedicated rib radiographs demonstrate subtle irregularity about the approximately 8th-9th anterior lateral left rib. IMPRESSION: Minimally displaced fracture of a lower left anterior rib, likely subacute. Remote upper left rib fracture. No pleural fluid, pneumothorax, or other acute finding. Electronically Signed   By: Jeronimo GreavesKyle  Talbot M.D.   On: 01/27/2016 14:16   I have personally reviewed and evaluated these images and lab results as part of my  medical decision-making.   EKG Interpretation   Date/Time:  Tuesday January 27 2016 20:06:18 EDT Ventricular Rate:  86 PR Interval:  179 QRS Duration: 78 QT Interval:  374 QTC Calculation: 447 R Axis:   60 Text Interpretation:  Normal sinus rhythm Normal ECG Confirmed by Yashira Offenberger MD,  Ilaria Much ([id number]) on 01/27/2016 8:42:08 PM      MDM   Final diagnoses:  Rib pain   60 y.o. Male complaining of headache and chest pain  after falls.  Patient has History of orthostatic hypotension autonomic dysfunction with multiple falls. These have increased somewhat and he fell down several steps 2 days ago. He really feels hemodynamically stable.CT of his head chest and abdominal pending. I discussed the patient's care with Dr. Lottie Mussel. If these are normal, the plan is to discharge him. He understands plan and voices agreement.   Margarita Grizzle, MD 01/28/16 [phone]

## 2016-01-27 NOTE — Telephone Encounter (Signed)
New message      Pt c/o Syncope: STAT if syncope occurred within 30 minutes and pt complains of lightheadedness High Priority if episode of passing out, completely, today or in last 24 hours   Did you pass out today?  Pt is still in bed---if he get up, he will most likely pass out sometimes later 1. When is the last time you passed out? Yesterday-----he passed out 4 times  Has this occurred multiple times? yes 2. Did you have any symptoms prior to passing out? Sometimes he has a ringing in his ear----says father

## 2016-01-28 ENCOUNTER — Emergency Department (HOSPITAL_COMMUNITY): Payer: Medicare Other

## 2016-01-28 DIAGNOSIS — F17210 Nicotine dependence, cigarettes, uncomplicated: Secondary | ICD-10-CM | POA: Diagnosis present

## 2016-01-28 DIAGNOSIS — Z7982 Long term (current) use of aspirin: Secondary | ICD-10-CM | POA: Diagnosis not present

## 2016-01-28 DIAGNOSIS — Z79899 Other long term (current) drug therapy: Secondary | ICD-10-CM | POA: Diagnosis not present

## 2016-01-28 DIAGNOSIS — E785 Hyperlipidemia, unspecified: Secondary | ICD-10-CM | POA: Diagnosis not present

## 2016-01-28 DIAGNOSIS — G4733 Obstructive sleep apnea (adult) (pediatric): Secondary | ICD-10-CM | POA: Diagnosis not present

## 2016-01-28 DIAGNOSIS — S065X0A Traumatic subdural hemorrhage without loss of consciousness, initial encounter: Secondary | ICD-10-CM | POA: Diagnosis not present

## 2016-01-28 DIAGNOSIS — S065X9A Traumatic subdural hemorrhage with loss of consciousness of unspecified duration, initial encounter: Secondary | ICD-10-CM | POA: Diagnosis present

## 2016-01-28 DIAGNOSIS — W109XXA Fall (on) (from) unspecified stairs and steps, initial encounter: Secondary | ICD-10-CM | POA: Diagnosis present

## 2016-01-28 DIAGNOSIS — Z9181 History of falling: Secondary | ICD-10-CM | POA: Diagnosis not present

## 2016-01-28 DIAGNOSIS — Z881 Allergy status to other antibiotic agents status: Secondary | ICD-10-CM | POA: Diagnosis not present

## 2016-01-28 DIAGNOSIS — S2242XA Multiple fractures of ribs, left side, initial encounter for closed fracture: Secondary | ICD-10-CM | POA: Diagnosis not present

## 2016-01-28 DIAGNOSIS — I10 Essential (primary) hypertension: Secondary | ICD-10-CM | POA: Diagnosis not present

## 2016-01-28 DIAGNOSIS — Z96641 Presence of right artificial hip joint: Secondary | ICD-10-CM | POA: Diagnosis not present

## 2016-01-28 DIAGNOSIS — R1011 Right upper quadrant pain: Secondary | ICD-10-CM | POA: Diagnosis not present

## 2016-01-28 DIAGNOSIS — Z888 Allergy status to other drugs, medicaments and biological substances status: Secondary | ICD-10-CM | POA: Diagnosis not present

## 2016-01-28 DIAGNOSIS — S52514A Nondisplaced fracture of right radial styloid process, initial encounter for closed fracture: Secondary | ICD-10-CM | POA: Diagnosis not present

## 2016-01-28 DIAGNOSIS — R402414 Glasgow coma scale score 13-15, 24 hours or more after hospital admission: Secondary | ICD-10-CM | POA: Diagnosis not present

## 2016-01-28 DIAGNOSIS — G40909 Epilepsy, unspecified, not intractable, without status epilepticus: Secondary | ICD-10-CM | POA: Diagnosis present

## 2016-01-28 DIAGNOSIS — S065XAA Traumatic subdural hemorrhage with loss of consciousness status unknown, initial encounter: Secondary | ICD-10-CM | POA: Diagnosis present

## 2016-01-28 DIAGNOSIS — R51 Headache: Secondary | ICD-10-CM | POA: Diagnosis not present

## 2016-01-28 MED ORDER — HYDROMORPHONE HCL 1 MG/ML IJ SOLN: 1.0000 mg | INTRAMUSCULAR | Status: DC | PRN

## 2016-01-28 MED ORDER — PINDOLOL 5 MG PO TABS
2.5000 mg | ORAL_TABLET | Freq: Two times a day (BID) | ORAL | Status: DC
  Administered 2016-01-28: 2.5 mg via ORAL
  Filled 2016-01-28 (×2): qty 1

## 2016-01-28 MED ORDER — KCL IN DEXTROSE-NACL 20-5-0.9 MEQ/L-%-% IV SOLN
INTRAVENOUS | Status: DC
  Filled 2016-01-28 (×2): qty 1000

## 2016-01-28 MED ORDER — LORATADINE 10 MG PO TABS
10.0000 mg | ORAL_TABLET | Freq: Every day | ORAL | Status: DC
  Administered 2016-01-28: 10 mg via ORAL
  Filled 2016-01-28: qty 1

## 2016-01-28 MED ORDER — MIDODRINE HCL 2.5 MG PO TABS
2.5000 mg | ORAL_TABLET | Freq: Two times a day (BID) | ORAL | Status: DC
  Filled 2016-01-28: qty 1

## 2016-01-28 MED ORDER — ATORVASTATIN CALCIUM 10 MG PO TABS
40.0000 mg | ORAL_TABLET | Freq: Every day | ORAL | Status: DC
  Administered 2016-01-28: 40 mg via ORAL
  Filled 2016-01-28: qty 4

## 2016-01-28 MED ORDER — OXYCODONE HCL 5 MG PO TABS
10.0000 mg | ORAL_TABLET | ORAL | Status: DC | PRN
  Administered 2016-01-28: 10 mg via ORAL
  Filled 2016-01-28: qty 2

## 2016-01-28 MED ORDER — DULOXETINE HCL 60 MG PO CPEP
60.0000 mg | ORAL_CAPSULE | Freq: Two times a day (BID) | ORAL | Status: DC
  Administered 2016-01-28: 60 mg via ORAL
  Filled 2016-01-28: qty 1

## 2016-01-28 MED ORDER — PREGABALIN 75 MG PO CAPS
75.0000 mg | ORAL_CAPSULE | Freq: Two times a day (BID) | ORAL | Status: DC
  Administered 2016-01-28: 75 mg via ORAL
  Filled 2016-01-28: qty 1

## 2016-01-28 MED ORDER — RISPERIDONE 0.5 MG PO TABS
1.0000 mg | ORAL_TABLET | Freq: Three times a day (TID) | ORAL | Status: DC
  Administered 2016-01-28: 1 mg via ORAL
  Administered 2016-01-28: 2 mg via ORAL
  Filled 2016-01-28: qty 8

## 2016-01-28 MED ORDER — VITAMIN B-1 100 MG PO TABS
ORAL_TABLET | ORAL | Status: AC
  Filled 2016-01-28: qty 1

## 2016-01-28 MED ORDER — BENZTROPINE MESYLATE 1 MG PO TABS
3.0000 mg | ORAL_TABLET | Freq: Three times a day (TID) | ORAL | Status: DC
  Administered 2016-01-28 (×2): 3 mg via ORAL
  Filled 2016-01-28 (×2): qty 3

## 2016-01-28 MED ORDER — TRAZODONE HCL 50 MG PO TABS: 150.0000 mg | ORAL_TABLET | Freq: Every day | ORAL | Status: DC

## 2016-01-28 MED ORDER — LEVETIRACETAM 500 MG PO TABS
500.0000 mg | ORAL_TABLET | Freq: Two times a day (BID) | ORAL | Status: DC
  Administered 2016-01-28 (×2): 500 mg via ORAL
  Filled 2016-01-28 (×4): qty 1

## 2016-01-28 MED ORDER — VITAMIN B-1 100 MG PO TABS
100.0000 mg | ORAL_TABLET | Freq: Every day | ORAL | Status: DC
  Administered 2016-01-28: 100 mg via ORAL

## 2016-01-28 MED ORDER — OXYCODONE HCL 10 MG PO TABS: 10.0000 mg | ORAL_TABLET | ORAL | Status: DC | PRN

## 2016-01-28 MED ORDER — ONDANSETRON HCL 4 MG/2ML IJ SOLN: 4.0000 mg | Freq: Four times a day (QID) | INTRAMUSCULAR | Status: DC | PRN

## 2016-01-28 MED ORDER — ONDANSETRON HCL 4 MG PO TABS: 4.0000 mg | ORAL_TABLET | Freq: Four times a day (QID) | ORAL | Status: DC | PRN

## 2016-01-28 MED ORDER — MIDODRINE HCL 2.5 MG PO TABS
2.5000 mg | ORAL_TABLET | ORAL | Status: DC
  Administered 2016-01-28 (×2): 2.5 mg via ORAL
  Filled 2016-01-28 (×2): qty 1

## 2016-01-28 MED ORDER — IOHEXOL 300 MG/ML  SOLN
100.0000 mL | Freq: Once | INTRAMUSCULAR | Status: AC | PRN
  Administered 2016-01-28: 100 mL via INTRAVENOUS

## 2016-01-28 NOTE — H&P (Signed)
History   Jose Li is an 60 y.o. male.   Chief Complaint:  Chief Complaint  Patient presents with  . Loss of Consciousness  . Fall    Loss of Consciousness Associated symptoms: headaches and seizures   Associated symptoms: no chest pain   Fall Associated symptoms include headaches. Pertinent negatives include no chest pain.   asked to see patient at the request of Dr.Oni secondary to a fall 2 days ago. The patient has a history of autonomic dysfunction and orthostatic hypotension. He is of many falls in the past. He fell 2 days ago. Since that time, he complains of a headache and right wrist pain. CT scan shows a 4 mm subdural hematoma on the right, evidence of old infarcts and a nondisplaced right distal radius fracture. He sought care in the emergency room for his headache primarily. He denies chest pain, abdominal pain or lower extremity pain. He denies back pain.  Past Medical History  Diagnosis Date  . Allergy   . Anxiety   . Depression   . Hyperlipidemia   . Substance abuse   . Seizures ([place])     none for last 6-69month  . Hypertension, essential, benign 08/02/2012  . Orthostatic hypotension 10/09/2013  . OSA (obstructive sleep apnea) 10/01/2015    Severe OSA with an AHI of 86/hr now on BiPAP at 23/19cm H2O    Past Surgical History  Procedure Laterality Date  . Fracture surgery      right hip and femur  . Joint replacement      right hip  . Tracheostomy  08/1999    Had pneumoniaand was in a coma 10 days  . Hernia repair  [id number]/7062   umbilical hernia/ got infected    Family History  Problem Relation Age of Onset  . Heart disease Mother   . Diabetes Father   . Heart disease Brother   . Heart attack Brother   . Hypertension Neg Hx   . Stroke Neg Hx    Social History:  reports that he has been smoking Cigarettes.  He has a 9 pack-year smoking history. He has never used smokeless tobacco. He reports that he does not drink alcohol or use illicit  drugs.  Allergies   Allergies  Allergen Reactions  . Erythromycin   . Hctz [Hydrochlorothiazide]     "Makes him faint" per patient Dr. RHarrington Challenger([place]) took him off  . Propranolol     "Makes him faint" per patient Dr. RHarrington Challenger([place]) took him off      Home Medications   (Not in a hospital admission)  Trauma Course   Results for orders placed or performed during the hospital encounter of 01/27/16 (from the past 48 hour(s))  Basic metabolic panel     Status: Abnormal   Collection Time: 01/27/16  1:40 PM  Result Value Ref Range   Sodium 133 (L) 135 - 145 mmol/L   Potassium 4.2 3.5 - 5.1 mmol/L   Chloride 97 (L) 101 - 111 mmol/L   CO2 24 22 - 32 mmol/L   Glucose, Bld 82 65 - 99 mg/dL   BUN <5 (L) 6 - 20 mg/dL   Creatinine, Ser 1.37 (H) 0.61 - 1.24 mg/dL   Calcium 8.9 8.9 - 10.3 mg/dL   GFR calc non Af Amer 55 (L) >60 mL/min   GFR calc Af Amer >60 >60 mL/min    Comment: (NOTE) The eGFR has been calculated using the CKD EPI equation. This calculation has  not been validated in all clinical situations. eGFR's persistently <60 mL/min signify possible Chronic Kidney Disease.    Anion gap 12 5 - 15  CBC     Status: Abnormal   Collection Time: 01/27/16  1:40 PM  Result Value Ref Range   WBC 15.6 (H) 4.0 - 10.5 K/uL   RBC 4.40 4.22 - 5.81 MIL/uL   Hemoglobin 12.1 (L) 13.0 - 17.0 g/dL   HCT 35.9 (L) 39.0 - 52.0 %   MCV 81.6 78.0 - 100.0 fL   MCH 27.5 26.0 - 34.0 pg   MCHC 33.7 30.0 - 36.0 g/dL   RDW 12.9 11.5 - 15.5 %   Platelets 385 150 - 400 K/uL  Urinalysis, Routine w reflex microscopic (not at [facility])     Status: None   Collection Time: 01/27/16  8:03 PM  Result Value Ref Range   Color, Urine YELLOW YELLOW   APPearance CLEAR CLEAR   Specific Gravity, Urine 1.024 1.005 - 1.030   pH 5.0 5.0 - 8.0   Glucose, UA NEGATIVE NEGATIVE mg/dL   Hgb urine dipstick NEGATIVE NEGATIVE   Bilirubin Urine NEGATIVE NEGATIVE   Ketones, ur NEGATIVE NEGATIVE mg/dL   Protein, ur NEGATIVE  NEGATIVE mg/dL   Nitrite NEGATIVE NEGATIVE   Leukocytes, UA NEGATIVE NEGATIVE    Comment: MICROSCOPIC NOT DONE ON URINES WITH NEGATIVE PROTEIN, BLOOD, LEUKOCYTES, NITRITE, OR GLUCOSE <1000 mg/dL.   Dg Ribs Bilateral W/chest  01/27/2016  CLINICAL DATA:  Initial encounter for FELL DOWN STEPS ON Saturday,PAIN LOWER ANTERIOR BILATERAL RIBS.LEFT > RIGHT EXAM: BILATERAL RIBS AND CHEST - 4+ VIEW COMPARISON:  Chest radiograph 07/26/2009 FINDINGS: Frontal view of the chest and multiple views of bilateral ribs. Frontal view of the chest demonstrates sixth posterior lateral left rib remote trauma. Midline trachea. Normal heart size and mediastinal contours. No pleural effusion or pneumothorax. Mild biapical pleural thickening. Clear lungs. Dedicated rib radiographs demonstrate subtle irregularity about the approximately 8th-9th anterior lateral left rib. IMPRESSION: Minimally displaced fracture of a lower left anterior rib, likely subacute. Remote upper left rib fracture. No pleural fluid, pneumothorax, or other acute finding. Electronically Signed   By: Abigail Miyamoto M.D.   On: 01/27/2016 14:16   Dg Wrist Complete Right  01/28/2016  CLINICAL DATA:  Fall down steps 2 days ago with pain in the right wrist ever since. Initial encounter. EXAM: RIGHT WRIST - COMPLETE 3+ VIEW COMPARISON:  None. FINDINGS: Nondisplaced fracture through the radial styloid. Normal radiocarpal alignment. IMPRESSION: Nondisplaced radial styloid fracture. Electronically Signed   By: Monte Fantasia M.D.   On: 01/28/2016 00:38   Ct Head Wo Contrast  01/28/2016  CLINICAL DATA:  Four syncopal episodes yesterday, fell. Tinnitus. History of seizures, migraines, substance abuse, hyperlipidemia. EXAM: CT HEAD WITHOUT CONTRAST TECHNIQUE: Contiguous axial images were obtained from the base of the skull through the vertex without intravenous contrast. COMPARISON:  None. FINDINGS: 4 mm parafalcine dense subdural hematoma. Trace RIGHT frontal subdural  hematoma. Minimal subarachnoid hemorrhage versus subdural hematoma RIGHT parietal convexity. Moderate ventriculomegaly on the basis of global parenchymal brain volume loss. Old RIGHT basal ganglia lacunar infarct. Small area RIGHT parietal encephalomalacia. No intraparenchymal hemorrhage, mass effect nor midline shift. No acute large vascular territory infarcts. Basal cisterns are patent. Mild calcific atherosclerosis the carotid siphons. No skull fracture. Moderate RIGHT parieto-occipital scalp hematoma with subcutaneous gas, no radiopaque foreign bodies. The included ocular globes and orbital contents are non-suspicious. The mastoid aircells and included paranasal sinuses are well-aerated. Patient is edentulous. IMPRESSION:  4 mm acute parafalcine subdural hematoma. Trace RIGHT frontal subdural hematoma. Minimal RIGHT extra-axial blood products. Moderate RIGHT posterior scalp hematoma and laceration. No skull fracture. Moderate global parenchymal brain volume loss, advanced for age. Old RIGHT basal ganglia lacunar infarct. Small RIGHT parietal infarct. Acute findings discussed with and reconfirmed by Dr.ONI on 01/28/2016 at 12:30 am. Electronically Signed   By: Elon Alas M.D.   On: 01/28/2016 00:30   Ct Chest W Contrast  01/28/2016  CLINICAL DATA:  Status post fall during syncopal episode. Acute onset of left upper quadrant abdominal pain. Initial encounter. EXAM: CT CHEST, ABDOMEN, AND PELVIS WITH CONTRAST TECHNIQUE: Multidetector CT imaging of the chest, abdomen and pelvis was performed following the standard protocol during bolus administration of intravenous contrast. CONTRAST:  166m OMNIPAQUE IOHEXOL 300 MG/ML  SOLN COMPARISON:  Chest radiograph performed 01/27/2016, and CT of the abdomen and pelvis performed 07/26/2009 FINDINGS: CT CHEST Mild bibasilar atelectasis is noted. The lungs are otherwise clear. No pleural effusion or pneumothorax is seen. No masses are identified. There is no evidence of  pulmonary parenchymal contusion. The mediastinum is unremarkable in appearance. There is no evidence of venous hemorrhage. No mediastinal lymphadenopathy is seen. No pericardial effusion is identified. The great vessels are grossly unremarkable in appearance. The visualized portions of the thyroid gland are unremarkable. No axillary lymphadenopathy is seen. There is no evidence of significant soft injury along the chest wall. There appear to be minimally displaced acute fractures of the left lateral fifth through tenth ribs, with additional chronic rib deformities noted on the left side. CT ABDOMEN AND PELVIS No free air or free fluid is seen within the abdomen or pelvis. There is no evidence of solid or hollow organ injury. The liver and spleen are unremarkable in appearance. The gallbladder is within normal limits. The pancreas and adrenal glands are unremarkable. Nonspecific perinephric stranding is noted bilaterally. A 1.6 cm left renal cyst is noted. There is no evidence of hydronephrosis. No renal or ureteral stones are identified. No free fluid is identified. The small bowel is unremarkable in appearance. The stomach is within normal limits. No acute vascular abnormalities are seen. Minimal calcification is noted at the distal abdominal aorta. The appendix is not definitely characterized; there is no evidence for appendicitis. Minimal diverticulosis is noted along the descending colon, without evidence of diverticulitis. The bladder is is moderately distended and grossly unremarkable. The prostate remains normal in size. Small bilateral inguinal hernias are seen, containing only fat. No inguinal lymphadenopathy is seen. No acute osseous abnormalities are identified. The patient's right femoral hardware is only partially imaged. IMPRESSION: 1. Minimally displaced acute fractures of the left lateral fifth through tenth ribs. 2. Mild bibasilar atelectasis noted.  Lungs otherwise clear. 3. No evidence of  traumatic injury to the abdomen or pelvis. 4. Small left renal cyst noted. 5. Minimal diverticulosis along the descending colon without evidence of diverticulitis. 6. Small bilateral inguinal hernias, containing only fat. Electronically Signed   By: JGarald BaldingM.D.   On: 01/28/2016 02:05   Ct Abdomen Pelvis W Contrast  01/28/2016  CLINICAL DATA:  Status post fall during syncopal episode. Acute onset of left upper quadrant abdominal pain. Initial encounter. EXAM: CT CHEST, ABDOMEN, AND PELVIS WITH CONTRAST TECHNIQUE: Multidetector CT imaging of the chest, abdomen and pelvis was performed following the standard protocol during bolus administration of intravenous contrast. CONTRAST:  1022mOMNIPAQUE IOHEXOL 300 MG/ML  SOLN COMPARISON:  Chest radiograph performed 01/27/2016, and CT of the abdomen and  pelvis performed 07/26/2009 FINDINGS: CT CHEST Mild bibasilar atelectasis is noted. The lungs are otherwise clear. No pleural effusion or pneumothorax is seen. No masses are identified. There is no evidence of pulmonary parenchymal contusion. The mediastinum is unremarkable in appearance. There is no evidence of venous hemorrhage. No mediastinal lymphadenopathy is seen. No pericardial effusion is identified. The great vessels are grossly unremarkable in appearance. The visualized portions of the thyroid gland are unremarkable. No axillary lymphadenopathy is seen. There is no evidence of significant soft injury along the chest wall. There appear to be minimally displaced acute fractures of the left lateral fifth through tenth ribs, with additional chronic rib deformities noted on the left side. CT ABDOMEN AND PELVIS No free air or free fluid is seen within the abdomen or pelvis. There is no evidence of solid or hollow organ injury. The liver and spleen are unremarkable in appearance. The gallbladder is within normal limits. The pancreas and adrenal glands are unremarkable. Nonspecific perinephric stranding is noted  bilaterally. A 1.6 cm left renal cyst is noted. There is no evidence of hydronephrosis. No renal or ureteral stones are identified. No free fluid is identified. The small bowel is unremarkable in appearance. The stomach is within normal limits. No acute vascular abnormalities are seen. Minimal calcification is noted at the distal abdominal aorta. The appendix is not definitely characterized; there is no evidence for appendicitis. Minimal diverticulosis is noted along the descending colon, without evidence of diverticulitis. The bladder is is moderately distended and grossly unremarkable. The prostate remains normal in size. Small bilateral inguinal hernias are seen, containing only fat. No inguinal lymphadenopathy is seen. No acute osseous abnormalities are identified. The patient's right femoral hardware is only partially imaged. IMPRESSION: 1. Minimally displaced acute fractures of the left lateral fifth through tenth ribs. 2. Mild bibasilar atelectasis noted.  Lungs otherwise clear. 3. No evidence of traumatic injury to the abdomen or pelvis. 4. Small left renal cyst noted. 5. Minimal diverticulosis along the descending colon without evidence of diverticulitis. 6. Small bilateral inguinal hernias, containing only fat. Electronically Signed   By: Garald Balding M.D.   On: 01/28/2016 02:05    Review of Systems  Constitutional: Negative.   Eyes: Negative.   Cardiovascular: Positive for syncope. Negative for chest pain.  Gastrointestinal: Negative.   Skin: Negative.   Neurological: Positive for seizures and headaches. Negative for loss of consciousness.  Psychiatric/Behavioral: Positive for memory loss and substance abuse. The patient is nervous/anxious.     Blood pressure 139/74, pulse 98, temperature 98 F (36.7 C), temperature source Rectal, resp. rate 16, SpO2 96 %. Physical Exam  Constitutional: He is oriented to person, place, and time. He appears well-developed and well-nourished. No distress.   HENT:  Head: Normocephalic and atraumatic.  Mouth/Throat: Oropharynx is clear and moist.  Eyes: Conjunctivae and EOM are normal. Pupils are equal, round, and reactive to light. No scleral icterus.  Neck: Normal range of motion and full passive range of motion without pain. Neck supple. No spinous process tenderness and no muscular tenderness present.    Cardiovascular: Normal rate and regular rhythm.   No murmur heard. Respiratory: Effort normal and breath sounds normal.  GI: Soft. Bowel sounds are normal. He exhibits no distension. There is no tenderness. There is no rebound and no guarding.  Musculoskeletal:       Right wrist: He exhibits tenderness and swelling. He exhibits normal range of motion and no deformity.  Neurological: He is alert and oriented to person,  place, and time. He has normal strength. No cranial nerve deficit or sensory deficit. GCS eye subscore is 4. GCS verbal subscore is 5. GCS motor subscore is 6.  Skin: Skin is warm and dry.  Psychiatric: He has a normal mood and affect. His behavior is normal.     Assessment/Plan Fall 2 days ago  Small subdural hematoma-neurosurgery has been consulted by the EDP neurologically intact  Nondisplaced distal right radius fracture-splinted by EDP will call hand surgery later this morning  History of orthostatic hypotension and autonomic dysfunction followed by cardiology continue current medical treatment may need cardiology follow-up prior to discharge  Patient Active Problem List   Diagnosis Date Noted  . Vertigo 12/29/2015  . OSA (obstructive sleep apnea) 10/01/2015  . Orthostatic hypotension 10/09/2013  . Healthcare maintenance 09/21/2013  . Leg swelling 09/01/2012  . Hypertension, essential, benign 08/02/2012  . Routine history and physical examination of adult 02/15/2011  . OTHER DISORDERS OF EAR 05/12/2010  . HERNIA, VENTRAL 04/10/2009  . TOBACCO ABUSE 12/14/2007  . HYPERLIPIDEMIA 10/09/2007  . SYNCOPE  08/29/2007  . Epilepsy ([place]) 05/10/2007  . BIPOLAR DISORDER 01/12/2007  . MIGRAINE, UNSPEC., W/O INTRACTABLE MIGRAINE 01/12/2007    , A. 01/28/2016, 2:13 AM   Procedures

## 2016-01-28 NOTE — ED Notes (Signed)
Dr. Lindie SpruceWyatt down to see patient. Dr. Melvyn Novasrtmann paged for Dr. Lindie SpruceWyatt.

## 2016-01-28 NOTE — ED Notes (Signed)
Meal tray ordered 

## 2016-01-28 NOTE — ED Notes (Signed)
Patient back in room from CT. Awaiting IV team arrival to place IV for CT to be completed.

## 2016-01-28 NOTE — Consult Note (Signed)
CC:  Chief Complaint  Patient presents with  . Loss of Consciousness  . Fall    HPI: Jose Li is a 60 y.o. male with a history of autonomic dysfunction and orthostatic hypotension who suffered a fall on Sunday after standing up. He does also have a history of epilepsy. Since then he has been c/o right wrist pain and HA. HA is unchanged in severity since the fall. He denies changes in vision, or new N/T/W of the extremities.  PMH: Past Medical History  Diagnosis Date  . Allergy   . Anxiety   . Depression   . Hyperlipidemia   . Substance abuse   . Seizures ([facility])     none for last 6-75months  . Hypertension, essential, benign 08/02/2012  . Orthostatic hypotension 10/09/2013  . OSA (obstructive sleep apnea) 10/01/2015    Severe OSA with an AHI of 86/hr now on BiPAP at 23/19cm H2O    PSH: Past Surgical History  Procedure Laterality Date  . Fracture surgery      right hip and femur  . Joint replacement      right hip  . Tracheostomy  08/1999    Had pneumoniaand was in a coma 10 days  . Hernia repair  07/2009    umbilical hernia/ got infected    SH: Social History  Substance Use Topics  . Smoking status: Current Every Day Smoker -- 0.30 packs/day for 30 years    Types: Cigarettes  . Smokeless tobacco: Never Used  . Alcohol Use: No     Comment: history of alcohol abuse.    MEDS: Prior to Admission medications   Medication Sig Start Date End Date Taking? Authorizing Provider  aspirin 81 MG EC tablet Take 81 mg by mouth daily.     Yes Historical Provider, MD  atorvastatin (LIPITOR) 40 MG tablet Take 1 tablet (40 mg total) by mouth daily. 01/06/16  Yes Almon Hercules, MD  benztropine (COGENTIN) 1 MG tablet Take 3 mg by mouth 3 (three) times daily. TAKE 1/2 TAB  AM, TAKE 1/2 TAB,  10 AM  TAKE 2 MG @ BEDTIME TOTAL OF 3 MG   Yes Historical Provider, MD  cetirizine (ZYRTEC) 10 MG tablet Take 1 tablet (10 mg total) by mouth daily. 07/18/15  Yes Almon Hercules, MD  DULoxetine  (CYMBALTA) 60 MG capsule Take 60 mg by mouth 2 (two) times daily.    Yes Historical Provider, MD  ibuprofen (ADVIL,MOTRIN) 600 MG tablet TAKE ONE TABLET BY MOUTH TWICE DAILY AS NEEDED FOR HEADACHE OR MODERATE PAIN 01/20/16  Yes Almon Hercules, MD  levETIRAcetam (KEPPRA) 750 MG tablet TAKE TWO TABLETS BY MOUTH TWICE DAILY. 10/30/15  Yes Nilda Riggs, NP  LYRICA 75 MG capsule TAKE ONE CAPSULE BY MOUTH TWICE DAILY 10/15/15  Yes York Spaniel, MD  midodrine (PROAMATINE) 5 MG tablet Take 1 tablet by mouth at 6 am and 1 tablet 10 am and 1 tablet at 2 pm daily. 01/05/16  Yes Pricilla Riffle, MD  pindolol (VISKEN) 5 MG tablet TAKE ONE-HALF TABLET BY MOUTH TWICE DAILY 10/03/15  Yes Pricilla Riffle, MD  potassium chloride SA (KLOR-CON M20) 20 MEQ tablet TAKE TWO TABLETS BY MOUTH IN THE AM & TAKE ONE TABLET BY MOUTH IN THE PM. 01/01/16  Yes Pricilla Riffle, MD  risperiDONE (RISPERDAL) 2 MG tablet Take 1-4 mg by mouth 3 (three) times daily. Take 1 mg every morning Take 1 mg at lunch Take 4 mg  at bedtime   Yes Historical Provider, MD  thiamine 100 MG tablet Take 100 mg by mouth daily.     Yes Historical Provider, MD  traZODone (DESYREL) 150 MG tablet Take 150 mg by mouth at bedtime.  09/24/15  Yes Historical Provider, MD    ALLERGY: Allergies  Allergen Reactions  . Erythromycin   . Hctz [Hydrochlorothiazide]     "Makes him faint" per patient Dr. Tenny Crawoss ([place]) took him off  . Propranolol     "Makes him faint" per patient Dr. Tenny Crawoss ([place]) took him off      ROS: ROS  NEUROLOGIC EXAM: Awake, alert, oriented Memory and concentration grossly intact Speech fluent, appropriate, although somewhat slowed CN grossly intact Motor exam: Upper Extremities Deltoid Bicep Tricep Grip  Right 5/5 5/5 5/5 5/5  Left 5/5 5/5 5/5 5/5   Lower Extremity IP Quad PF DF EHL  Right 5/5 5/5 5/5 5/5 5/5  Left 5/5 5/5 5/5 5/5 5/5   Sensation grossly intact to LT  IMGAING: CTH demonstrates small falcine SDH without  local mass effect. No HCP. There is age-dysproportionate atrophy.  IMPRESSION: - 60 y.o. male s/p fall with clinically insignificant falcine SDH, neurologically at baseline  PLAN: - Does not need any neurosurgical intervention. - Asked pt to hold home baby ASA  - Already on Keppra for hx of SZ - Can f/u in my office in 3-4 weeks.

## 2016-01-28 NOTE — ED Notes (Signed)
Neuro surgery at bedside.

## 2016-01-28 NOTE — ED Notes (Signed)
Attempted to page neuro surgery for pt's fathers inquiry.

## 2016-01-28 NOTE — Discharge Instructions (Signed)
Nonsutured Laceration Care °A laceration is a cut that goes through all layers of the skin and extends into the tissue that is right under the skin. This type of cut is usually stitched up (sutured) or closed with tape (adhesive strips) or skin glue shortly after the injury happens. °However, if the wound is dirty or if several hours pass before medical treatment is provided, it is likely that germs (bacteria) will enter the wound. Closing a laceration after bacteria have entered it increases the risk of infection. In these cases, your health care provider may leave the laceration open (nonsutured) and cover it with a bandage. This type of treatment helps prevent infection and allows the wound to heal from the deepest layer of tissue damage up to the surface. °An open fracture is a type of injury that may involve nonsutured lacerations. An open fracture is a break in a bone that happens along with one or more lacerations through the skin that is near the fracture site. °HOW TO CARE FOR YOUR NONSUTURED LACERATION °· Take or apply over-the-counter and prescription medicines only as told by your health care provider. °· If you were prescribed an antibiotic medicine, take or apply it as told by your health care provider. Do not stop using the antibiotic even if your condition improves. °· Clean the wound one time each day or as told by your health care provider. °¨ Wash the wound with mild soap and water. °¨ Rinse the wound with water to remove all soap. °¨ Pat your wound dry with a clean towel. Do not rub the wound. °· Do not inject anything into the wound unless your health care provider told you to. °· Change any bandages (dressings) as told by your health care provider. This includes changing the dressing if it gets wet, dirty, or starts to smell bad. °· Keep the dressing dry until your health care provider says it can be removed. Do not take baths, swim, or do anything that puts your wound underwater until your  health care provider approves. °· Raise (elevate) the injured area above the level of your heart while you are sitting or lying down, if possible. °· Do not scratch or pick at the wound. °· Check your wound every day for signs of infection. Watch for: °¨ Redness, swelling, or pain. °¨ Fluid, blood, or pus. °· Keep all follow-up visits as told by your health care provider. This is important. °SEEK MEDICAL CARE IF: °· You received a tetanus and shot and you have swelling, severe pain, redness, or bleeding at the injection site.   °· You have a fever. °· Your pain is not controlled with medicine. °· You have increased redness, swelling, or pain at the site of your wound. °· You have fluid, blood, or pus coming from your wound. °· You notice a bad smell coming from your wound or your dressing. °· You notice something coming out of the wound, such as wood or glass. °· You notice a change in the color of your skin near your wound. °· You develop a new rash. °· You need to change the dressing frequently due to fluid, blood, or pus draining from the wound. °· You develop numbness around your wound. °SEEK IMMEDIATE MEDICAL CARE IF: °· Your pain suddenly increases and is severe. °· You develop severe swelling around the wound. °· The wound is on your hand or foot and you cannot properly move a finger or toe. °· The wound is on your hand or   foot and you notice that your fingers or toes look pale or bluish. °· You have a red streak going away from your wound. °  °This information is not intended to replace advice given to you by your health care provider. Make sure you discuss any questions you have with your health care provider. °  °Document Released: 09/29/2006 Document Revised: 03/18/2015 Document Reviewed: 10/28/2014 °Elsevier Interactive Patient Education ©2016 [employer] ° °

## 2016-01-28 NOTE — ED Notes (Signed)
Another RN called saying pt. Could not be found. Tech walked outside and found pt. There. Pt. Reminded that he cannot leave. Pt. Escorted back to his room and safety sitter initiated.

## 2016-01-28 NOTE — Progress Notes (Signed)
The patient has been in the ED for over 24 hours and is clinically stable.  I have spoken with the neurosurgeon who has cleared the patient for not needing a repeat CT head.  Clinically he has a GCS of 15, and is mentally at his baseline according to his father who is at the bedside.  All we have to do is get a follow-up with hand surgery for his minimally displaced radius fracture.  He will follow up with Dr. Orlan Leavensrtman tomorrow.  Marta LamasJames O. Gae BonWyatt, III, MD, FACS [phone] Trauma Surgeon

## 2016-01-28 NOTE — ED Notes (Signed)
Trauma surgery at bedside.

## 2016-01-28 NOTE — ED Notes (Signed)
Patient transported to CT via stretcher.

## 2016-01-28 NOTE — ED Notes (Signed)
Ortho notified of need for thumb spica splint.

## 2016-01-28 NOTE — ED Provider Notes (Addendum)
By signing my name below, I, Marisue HumbleMichelle Chaffee, attest that this documentation has been prepared under the direction and in the presence of Tomasita CrumbleAdeleke Dashanna Kinnamon, MD . Electronically Signed: Marisue HumbleMichelle Chaffee, Scribe. 01/28/2016. 12:47 AM.   12:45 AM  Angiocath insertion Performed by: Tomasita CrumbleAdeleke Facundo Allemand, MD  Consent: Verbal consent obtained. Risks and benefits: risks, benefits and alternatives were discussed Time out: Immediately prior to procedure a "time out" was called to verify the correct patient, procedure, equipment, support staff and site/side marked as required. Preparation: Patient was prepped and draped in the usual sterile fashion.  Vein Location: Left AC  Ultrasound Guided  Gauge: 20G  Normal blood return and flush without difficulty Patient tolerance: Patient tolerated the procedure well with no immediate complications.  Patient signed out to me as pending CT scan for evaluation for injury. CT reveals a subdural hematoma. X-rays reveals a radial styloid fracture. I spoke with Dr. Latricia Heftornell, Kern AlbertaSergio assess the patient for further care. Dr. Conni ElliotNunkamar with neurosurgery was also consulted and will evaluate the patient. Splint was applied to radial fracture.    I personally performed the services described in this documentation, which was scribed in my presence. The recorded information has been reviewed and is accurate.      Tomasita CrumbleAdeleke Ellee Wawrzyniak, MD 01/28/16 [phone]

## 2016-01-28 NOTE — ED Notes (Signed)
Attempted to page neuro

## 2016-01-28 NOTE — ED Notes (Signed)
CT notified patient has 20 gauge in L AC placed by Dr. Mora Bellmanni. Patient ready to be transported to CT.

## 2016-01-28 NOTE — ED Notes (Signed)
Patient got out of bed, and pulled IV out of arm. Will notify MD.

## 2016-01-29 ENCOUNTER — Other Ambulatory Visit: Payer: Self-pay | Admitting: Student

## 2016-01-30 NOTE — Telephone Encounter (Signed)
Please call and let the patient know that he has another prescription at pharmacy for Lipitor 40 mg with 11 refills. He is on 40 mg once daily.  Thank you, Alwyn Renaye

## 2016-02-02 NOTE — Telephone Encounter (Signed)
LVM for pt to call back to inform him of below. Zimmerman Rumple, April D, CMA  

## 2016-02-04 ENCOUNTER — Telehealth: Payer: Self-pay | Admitting: Internal Medicine

## 2016-02-04 NOTE — Telephone Encounter (Signed)
Spoke with Mr. Jose Li, patient's father. He states the patient continues to pass out daily.  He was up at 3:30 am getting drink of water from the sink and fell backwards into the bathtub. He has no injury as a result as far as his father could tell.  He also already passed out again today in his bedroom.  No injury.  Mr. Jose Li also notes that patient has now been "getting things mixed up, can't remember where to put things".  This is new for him.  Has appointment with hand surgeon tomorrow am and will take him as long as he is walking ok Not scheduled to see Dr. Tenny Crawoss until 4/3 and neuro on 4/17 according to patient's father.  He is aware I am forwarding to Dr. Tenny Crawoss for review/recommendations for this matter and will call him back.

## 2016-02-04 NOTE — Telephone Encounter (Signed)
I am not in office this week Needs to be seen in clinic Thursday, may indeed need to be admitted if still passing out orthstatics and labs taken   He has hx of dizziness  Significant syncopal spells are new.

## 2016-02-04 NOTE — Telephone Encounter (Signed)
New message      Pt c/o Syncope: STAT if syncope occurred within 30 minutes and pt complains of lightheadedness High Priority if episode of passing out, completely, today or in last 24 hours   Did you pass out today?   Yes---this am around 3 When is the last time you passed out? Pt has passed out everyday this week Has this occurred multiple times? yes 1. Did you have any symptoms prior to passing out? No prior warning 2. Father of the patient said that something needs to be done before Jose Li gets hurt.  Please call

## 2016-02-05 ENCOUNTER — Encounter: Payer: Self-pay | Admitting: Internal Medicine

## 2016-02-05 ENCOUNTER — Inpatient Hospital Stay (HOSPITAL_COMMUNITY)
Admission: EM | Admit: 2016-02-05 | Discharge: 2016-02-09 | DRG: 640 | Disposition: A | Discharge status: Home Health | Payer: Medicare Other | Attending: Family Medicine | Admitting: Family Medicine

## 2016-02-05 ENCOUNTER — Ambulatory Visit (INDEPENDENT_AMBULATORY_CARE_PROVIDER_SITE_OTHER): Payer: Medicare Other | Admitting: Internal Medicine

## 2016-02-05 ENCOUNTER — Emergency Department (HOSPITAL_COMMUNITY): Payer: Medicare Other

## 2016-02-05 ENCOUNTER — Inpatient Hospital Stay (HOSPITAL_COMMUNITY): Payer: Medicare Other

## 2016-02-05 ENCOUNTER — Encounter (HOSPITAL_COMMUNITY): Payer: Self-pay

## 2016-02-05 VITALS — BP 150/90 | HR 88 | Ht 72.0 in | Wt 200.8 lb

## 2016-02-05 DIAGNOSIS — R569 Unspecified convulsions: Secondary | ICD-10-CM | POA: Insufficient documentation

## 2016-02-05 DIAGNOSIS — R55 Syncope and collapse: Secondary | ICD-10-CM | POA: Insufficient documentation

## 2016-02-05 DIAGNOSIS — G92 Toxic encephalopathy: Secondary | ICD-10-CM | POA: Diagnosis present

## 2016-02-05 DIAGNOSIS — I951 Orthostatic hypotension: Secondary | ICD-10-CM | POA: Insufficient documentation

## 2016-02-05 DIAGNOSIS — E871 Hypo-osmolality and hyponatremia: Principal | ICD-10-CM | POA: Insufficient documentation

## 2016-02-05 DIAGNOSIS — R109 Unspecified abdominal pain: Secondary | ICD-10-CM | POA: Insufficient documentation

## 2016-02-05 DIAGNOSIS — R1111 Vomiting without nausea: Secondary | ICD-10-CM | POA: Diagnosis not present

## 2016-02-05 DIAGNOSIS — Z881 Allergy status to other antibiotic agents status: Secondary | ICD-10-CM

## 2016-02-05 DIAGNOSIS — E785 Hyperlipidemia, unspecified: Secondary | ICD-10-CM | POA: Diagnosis present

## 2016-02-05 DIAGNOSIS — G40909 Epilepsy, unspecified, not intractable, without status epilepticus: Secondary | ICD-10-CM | POA: Diagnosis present

## 2016-02-05 DIAGNOSIS — Z87820 Personal history of traumatic brain injury: Secondary | ICD-10-CM | POA: Diagnosis not present

## 2016-02-05 DIAGNOSIS — I10 Essential (primary) hypertension: Secondary | ICD-10-CM | POA: Diagnosis not present

## 2016-02-05 DIAGNOSIS — F319 Bipolar disorder, unspecified: Secondary | ICD-10-CM | POA: Diagnosis present

## 2016-02-05 DIAGNOSIS — G901 Familial dysautonomia [Riley-Day]: Secondary | ICD-10-CM

## 2016-02-05 DIAGNOSIS — Z888 Allergy status to other drugs, medicaments and biological substances status: Secondary | ICD-10-CM

## 2016-02-05 DIAGNOSIS — E876 Hypokalemia: Secondary | ICD-10-CM | POA: Diagnosis present

## 2016-02-05 DIAGNOSIS — F3170 Bipolar disorder, currently in remission, most recent episode unspecified: Secondary | ICD-10-CM | POA: Insufficient documentation

## 2016-02-05 DIAGNOSIS — E860 Dehydration: Secondary | ICD-10-CM | POA: Diagnosis not present

## 2016-02-05 DIAGNOSIS — Z7982 Long term (current) use of aspirin: Secondary | ICD-10-CM

## 2016-02-05 DIAGNOSIS — Z9181 History of falling: Secondary | ICD-10-CM | POA: Insufficient documentation

## 2016-02-05 DIAGNOSIS — S069X9A Unspecified intracranial injury with loss of consciousness of unspecified duration, initial encounter: Secondary | ICD-10-CM | POA: Insufficient documentation

## 2016-02-05 DIAGNOSIS — S069X9S Unspecified intracranial injury with loss of consciousness of unspecified duration, sequela: Secondary | ICD-10-CM | POA: Insufficient documentation

## 2016-02-05 DIAGNOSIS — R4182 Altered mental status, unspecified: Secondary | ICD-10-CM | POA: Diagnosis not present

## 2016-02-05 DIAGNOSIS — G4733 Obstructive sleep apnea (adult) (pediatric): Secondary | ICD-10-CM | POA: Diagnosis present

## 2016-02-05 DIAGNOSIS — S52514A Nondisplaced fracture of right radial styloid process, initial encounter for closed fracture: Secondary | ICD-10-CM | POA: Diagnosis not present

## 2016-02-05 DIAGNOSIS — Z79899 Other long term (current) drug therapy: Secondary | ICD-10-CM

## 2016-02-05 DIAGNOSIS — F17210 Nicotine dependence, cigarettes, uncomplicated: Secondary | ICD-10-CM | POA: Diagnosis present

## 2016-02-05 DIAGNOSIS — T43215A Adverse effect of selective serotonin and norepinephrine reuptake inhibitors, initial encounter: Secondary | ICD-10-CM | POA: Diagnosis not present

## 2016-02-05 DIAGNOSIS — Z96641 Presence of right artificial hip joint: Secondary | ICD-10-CM | POA: Diagnosis not present

## 2016-02-05 DIAGNOSIS — J9811 Atelectasis: Secondary | ICD-10-CM | POA: Diagnosis not present

## 2016-02-05 DIAGNOSIS — F419 Anxiety disorder, unspecified: Secondary | ICD-10-CM | POA: Diagnosis present

## 2016-02-05 DIAGNOSIS — D72829 Elevated white blood cell count, unspecified: Secondary | ICD-10-CM | POA: Diagnosis not present

## 2016-02-05 DIAGNOSIS — G909 Disorder of the autonomic nervous system, unspecified: Secondary | ICD-10-CM | POA: Diagnosis not present

## 2016-02-05 DIAGNOSIS — Z8249 Family history of ischemic heart disease and other diseases of the circulatory system: Secondary | ICD-10-CM

## 2016-02-05 DIAGNOSIS — S069X0D Unspecified intracranial injury without loss of consciousness, subsequent encounter: Secondary | ICD-10-CM | POA: Diagnosis not present

## 2016-02-05 DIAGNOSIS — S069XAS Unspecified intracranial injury with loss of consciousness status unknown, sequela: Secondary | ICD-10-CM | POA: Insufficient documentation

## 2016-02-05 DIAGNOSIS — G928 Other toxic encephalopathy: Secondary | ICD-10-CM | POA: Diagnosis present

## 2016-02-05 DIAGNOSIS — K2970 Gastritis, unspecified, without bleeding: Secondary | ICD-10-CM | POA: Diagnosis not present

## 2016-02-05 DIAGNOSIS — S069XAA Unspecified intracranial injury with loss of consciousness status unknown, initial encounter: Secondary | ICD-10-CM | POA: Insufficient documentation

## 2016-02-05 DIAGNOSIS — S52509A Unspecified fracture of the lower end of unspecified radius, initial encounter for closed fracture: Secondary | ICD-10-CM | POA: Insufficient documentation

## 2016-02-05 LAB — URINALYSIS, ROUTINE W REFLEX MICROSCOPIC
Bilirubin Urine: NEGATIVE
Glucose, UA: NEGATIVE mg/dL
Hgb urine dipstick: NEGATIVE
Ketones, ur: 15 mg/dL — AB
LEUKOCYTES UA: NEGATIVE
NITRITE: NEGATIVE
PROTEIN: NEGATIVE mg/dL
SPECIFIC GRAVITY, URINE: 1.009 (ref 1.005–1.030)
pH: 8 (ref 5.0–8.0)

## 2016-02-05 LAB — CBC
HEMATOCRIT: 32.6 % — AB (ref 39.0–52.0)
HEMOGLOBIN: 11.9 g/dL — AB (ref 13.0–17.0)
MCH: 27.7 pg (ref 26.0–34.0)
MCHC: 36.5 g/dL — AB (ref 30.0–36.0)
MCV: 76 fL — ABNORMAL LOW (ref 78.0–100.0)
Platelets: 507 10*3/uL — ABNORMAL HIGH (ref 150–400)
RBC: 4.29 MIL/uL (ref 4.22–5.81)
RDW: 12.3 % (ref 11.5–15.5)
WBC: 17.5 10*3/uL — ABNORMAL HIGH (ref 4.0–10.5)

## 2016-02-05 LAB — COMPREHENSIVE METABOLIC PANEL
ALK PHOS: 99 U/L (ref 38–126)
ALT: 22 U/L (ref 17–63)
ANION GAP: 12 (ref 5–15)
AST: 36 U/L (ref 15–41)
Albumin: 3.4 g/dL — ABNORMAL LOW (ref 3.5–5.0)
BILIRUBIN TOTAL: 1 mg/dL (ref 0.3–1.2)
BUN: <5 mg/dL — ABNORMAL LOW (ref 6–20)
CHLORIDE: 86 mmol/L — AB (ref 101–111)
CO2: 22 mmol/L (ref 22–32)
Calcium: 8.4 mg/dL — ABNORMAL LOW (ref 8.9–10.3)
Creatinine, Ser: 0.9 mg/dL (ref 0.61–1.24)
GFR calc Af Amer: >60 mL/min (ref >60)
Glucose, Bld: 104 mg/dL — ABNORMAL HIGH (ref 65–99)
POTASSIUM: 3 mmol/L — AB (ref 3.5–5.1)
SODIUM: 120 mmol/L — AB (ref 135–145)
TOTAL PROTEIN: 6.6 g/dL (ref 6.5–8.1)

## 2016-02-05 LAB — LIPASE, BLOOD: LIPASE: 34 U/L (ref 11–51)

## 2016-02-05 LAB — TROPONIN I

## 2016-02-05 LAB — MAGNESIUM: MAGNESIUM: 1.2 mg/dL — AB (ref 1.7–2.4)

## 2016-02-05 MED ORDER — ASPIRIN EC 81 MG PO TBEC
81.0000 mg | DELAYED_RELEASE_TABLET | Freq: Every day | ORAL | Status: DC
  Administered 2016-02-06 – 2016-02-09 (×4): 81 mg via ORAL
  Filled 2016-02-05 (×5): qty 1

## 2016-02-05 MED ORDER — SODIUM CHLORIDE 0.9% FLUSH: 3.0000 mL | INTRAVENOUS | Status: DC | PRN

## 2016-02-05 MED ORDER — RISPERIDONE 1 MG PO TABS: 1.0000 mg | ORAL_TABLET | Freq: Three times a day (TID) | ORAL | Status: DC

## 2016-02-05 MED ORDER — PINDOLOL 5 MG PO TABS
2.5000 mg | ORAL_TABLET | Freq: Two times a day (BID) | ORAL | Status: DC
  Administered 2016-02-05 – 2016-02-09 (×8): 2.5 mg via ORAL
  Filled 2016-02-05 (×9): qty 1

## 2016-02-05 MED ORDER — MIDODRINE HCL 5 MG PO TABS
5.0000 mg | ORAL_TABLET | Freq: Three times a day (TID) | ORAL | Status: DC
  Administered 2016-02-06 – 2016-02-09 (×12): 5 mg via ORAL
  Filled 2016-02-05 (×12): qty 1

## 2016-02-05 MED ORDER — BENZTROPINE MESYLATE 1 MG PO TABS
1.5000 mg | ORAL_TABLET | Freq: Two times a day (BID) | ORAL | Status: DC
  Administered 2016-02-06 – 2016-02-09 (×8): 1.5 mg via ORAL
  Filled 2016-02-05 (×8): qty 2

## 2016-02-05 MED ORDER — BENZTROPINE MESYLATE 1 MG PO TABS: 3.0000 mg | ORAL_TABLET | Freq: Three times a day (TID) | ORAL | Status: DC

## 2016-02-05 MED ORDER — ATORVASTATIN CALCIUM 40 MG PO TABS
40.0000 mg | ORAL_TABLET | Freq: Every day | ORAL | Status: DC
  Administered 2016-02-05 – 2016-02-09 (×5): 40 mg via ORAL
  Filled 2016-02-05 (×5): qty 1

## 2016-02-05 MED ORDER — SODIUM CHLORIDE 0.9 % IV SOLN: INTRAVENOUS | Status: DC

## 2016-02-05 MED ORDER — PROMETHAZINE HCL 25 MG/ML IJ SOLN
12.5000 mg | Freq: Four times a day (QID) | INTRAMUSCULAR | Status: DC | PRN
  Administered 2016-02-05: 12.5 mg via INTRAVENOUS
  Filled 2016-02-05: qty 1

## 2016-02-05 MED ORDER — PREGABALIN 75 MG PO CAPS
75.0000 mg | ORAL_CAPSULE | Freq: Two times a day (BID) | ORAL | Status: DC
  Administered 2016-02-05 – 2016-02-09 (×8): 75 mg via ORAL
  Filled 2016-02-05 (×8): qty 1

## 2016-02-05 MED ORDER — RISPERIDONE 1 MG PO TABS
1.0000 mg | ORAL_TABLET | Freq: Two times a day (BID) | ORAL | Status: DC
  Administered 2016-02-06 – 2016-02-09 (×8): 1 mg via ORAL
  Filled 2016-02-05 (×8): qty 1

## 2016-02-05 MED ORDER — SODIUM CHLORIDE 0.9 % IV BOLUS (SEPSIS)
500.0000 mL | Freq: Once | INTRAVENOUS | Status: AC
  Administered 2016-02-05: 1000 mL via INTRAVENOUS

## 2016-02-05 MED ORDER — TRAZODONE HCL 100 MG PO TABS
150.0000 mg | ORAL_TABLET | Freq: Every day | ORAL | Status: DC
  Administered 2016-02-05 – 2016-02-08 (×4): 150 mg via ORAL
  Filled 2016-02-05 (×5): qty 1

## 2016-02-05 MED ORDER — SODIUM CHLORIDE 0.9 % IV SOLN: 250.0000 mL | INTRAVENOUS | Status: DC | PRN

## 2016-02-05 MED ORDER — VITAMIN B-1 100 MG PO TABS
100.0000 mg | ORAL_TABLET | Freq: Every day | ORAL | Status: DC
  Administered 2016-02-06 – 2016-02-09 (×4): 100 mg via ORAL
  Filled 2016-02-05 (×5): qty 1

## 2016-02-05 MED ORDER — PROMETHAZINE HCL 25 MG PO TABS: 12.5000 mg | ORAL_TABLET | Freq: Four times a day (QID) | ORAL | Status: DC | PRN

## 2016-02-05 MED ORDER — DULOXETINE HCL 60 MG PO CPEP
60.0000 mg | ORAL_CAPSULE | Freq: Two times a day (BID) | ORAL | Status: DC
  Administered 2016-02-05: 60 mg via ORAL
  Filled 2016-02-05: qty 1

## 2016-02-05 MED ORDER — SODIUM CHLORIDE 0.9 % IV BOLUS (SEPSIS)
500.0000 mL | Freq: Once | INTRAVENOUS | Status: AC
  Administered 2016-02-05: 500 mL via INTRAVENOUS

## 2016-02-05 MED ORDER — ACETAMINOPHEN 650 MG RE SUPP: 650.0000 mg | Freq: Four times a day (QID) | RECTAL | Status: DC | PRN

## 2016-02-05 MED ORDER — SODIUM CHLORIDE 0.9% FLUSH
3.0000 mL | Freq: Two times a day (BID) | INTRAVENOUS | Status: DC
  Administered 2016-02-06 – 2016-02-09 (×4): 3 mL via INTRAVENOUS

## 2016-02-05 MED ORDER — BENZTROPINE MESYLATE 1 MG PO TABS
3.0000 mg | ORAL_TABLET | Freq: Every day | ORAL | Status: DC
  Administered 2016-02-05 – 2016-02-08 (×4): 3 mg via ORAL
  Filled 2016-02-05 (×4): qty 3

## 2016-02-05 MED ORDER — SODIUM CHLORIDE 0.9% FLUSH
3.0000 mL | Freq: Two times a day (BID) | INTRAVENOUS | Status: DC
  Administered 2016-02-05 – 2016-02-07 (×5): 3 mL via INTRAVENOUS

## 2016-02-05 MED ORDER — RISPERIDONE 2 MG PO TABS
4.0000 mg | ORAL_TABLET | Freq: Every day | ORAL | Status: DC
  Administered 2016-02-05 – 2016-02-08 (×4): 4 mg via ORAL
  Filled 2016-02-05 (×5): qty 2

## 2016-02-05 MED ORDER — LEVETIRACETAM 750 MG PO TABS
1500.0000 mg | ORAL_TABLET | Freq: Two times a day (BID) | ORAL | Status: DC
  Administered 2016-02-05 – 2016-02-09 (×8): 1500 mg via ORAL
  Filled 2016-02-05 (×8): qty 2

## 2016-02-05 MED ORDER — POTASSIUM CHLORIDE CRYS ER 20 MEQ PO TBCR
40.0000 meq | EXTENDED_RELEASE_TABLET | Freq: Once | ORAL | Status: AC
  Administered 2016-02-05: 40 meq via ORAL
  Filled 2016-02-05: qty 2

## 2016-02-05 MED ORDER — ACETAMINOPHEN 325 MG PO TABS
650.0000 mg | ORAL_TABLET | Freq: Four times a day (QID) | ORAL | Status: DC | PRN
  Administered 2016-02-06 – 2016-02-09 (×5): 650 mg via ORAL
  Filled 2016-02-05 (×5): qty 2

## 2016-02-05 NOTE — ED Provider Notes (Signed)
CSN: [id number]     Arrival date & time 02/05/16  1715 History   First MD Initiated Contact with Patient 02/05/16 1719     Chief Complaint  Patient presents with  . Weakness  . Altered Mental Status     (Consider location/radiation/quality/duration/timing/severity/associated sxs/prior Treatment) Patient is a 60 y.o. male presenting with weakness and altered mental status. The history is provided by the patient and the EMS personnel.  Weakness Pertinent negatives include no chest pain, no abdominal pain, no headaches and no shortness of breath.  Altered Mental Status Presenting symptoms: no confusion   Associated symptoms: weakness   Associated symptoms: no abdominal pain, no fever, no headaches, no rash and no vomiting   Patient w hx mood disorder, anxiety, presents from his cardiologist office where he was being seen in follow up for long-standing orthostatic hypotension and syncope.  Pt w a few syncopal events in past week, c/w prior - pt denies any injury w episodes, or any fainting today. Pt was seen in ED 3/12 after fall, w rib fxs and small sdh - report of increased periods confusion since then, so was referred to ED for repeat assessment.  Pt denies headache. No chest pain. Intermittent epigastric pain in past, without acute worsening. No vomiting or diarrhea. No dysuria or gu c/o. w synopal events + generalized weakness, no focal or unilateral numbness or weakness.  No neck or back pain. Pt indicates compliant w normal meds, denies recent change in meds or doses.      Past Medical History  Diagnosis Date  . Allergy   . Anxiety   . Depression   . Hyperlipidemia   . Substance abuse   . Seizures (HCC)     none for last 6-74months  . Hypertension, essential, benign 08/02/2012  . Orthostatic hypotension 10/09/2013  . OSA (obstructive sleep apnea) 10/01/2015    Severe OSA with an AHI of 86/hr now on BiPAP at 23/19cm H2O   Past Surgical History  Procedure Laterality Date  .  Fracture surgery      right hip and femur  . Joint replacement      right hip  . Tracheostomy  08/1999    Had pneumoniaand was in a coma 10 days  . Hernia repair  07/2009    umbilical hernia/ got infected   Family History  Problem Relation Age of Onset  . Heart disease Mother   . Diabetes Father   . Heart disease Brother   . Heart attack Brother   . Hypertension Neg Hx   . Stroke Neg Hx    Social History  Substance Use Topics  . Smoking status: Current Every Day Smoker -- 0.30 packs/day for 30 years    Types: Cigarettes  . Smokeless tobacco: Never Used  . Alcohol Use: No     Comment: history of alcohol abuse.    Review of Systems  Constitutional: Negative for fever and chills.  HENT: Negative for sore throat.   Eyes: Negative for redness.  Respiratory: Negative for cough and shortness of breath.   Cardiovascular: Negative for chest pain and leg swelling.  Gastrointestinal: Negative for vomiting, abdominal pain and diarrhea.  Genitourinary: Negative for dysuria and flank pain.  Musculoskeletal: Negative for back pain and neck pain.  Skin: Negative for rash.  Neurological: Positive for syncope and weakness. Negative for headaches.  Hematological: Does not bruise/bleed easily.  Psychiatric/Behavioral: Negative for confusion.      Allergies  Erythromycin; Hctz; and Propranolol  Home Medications  Prior to Admission medications   Medication Sig Start Date End Date Taking? Authorizing Provider  aspirin 81 MG EC tablet Take 81 mg by mouth daily.      Historical Provider, MD  atorvastatin (LIPITOR) 40 MG tablet Take 1 tablet (40 mg total) by mouth daily. 01/06/16   Almon Hercules, MD  benztropine (COGENTIN) 1 MG tablet Take 3 mg by mouth 3 (three) times daily. TAKE 1/2 TAB  AM, TAKE 1/2 TAB,  10 AM  TAKE 2 MG @ BEDTIME TOTAL OF 3 MG    Historical Provider, MD  cetirizine (ZYRTEC) 10 MG tablet Take 1 tablet (10 mg total) by mouth daily. 07/18/15   Almon Hercules, MD   DULoxetine (CYMBALTA) 60 MG capsule Take 60 mg by mouth 2 (two) times daily.     Historical Provider, MD  levETIRAcetam (KEPPRA) 750 MG tablet TAKE TWO TABLETS BY MOUTH TWICE DAILY. 10/30/15   Nilda Riggs, NP  LYRICA 75 MG capsule TAKE ONE CAPSULE BY MOUTH TWICE DAILY 10/15/15   York Spaniel, MD  midodrine (PROAMATINE) 5 MG tablet Take 1 tablet by mouth at 6 am and 1 tablet 10 am and 1 tablet at 2 pm daily. 01/05/16   Pricilla Riffle, MD  oxyCODONE 10 MG TABS Take 1 tablet (10 mg total) by mouth every 4 (four) hours as needed for severe pain. 01/28/16   Jimmye Norman, MD  pindolol (VISKEN) 5 MG tablet TAKE ONE-HALF TABLET BY MOUTH TWICE DAILY 10/03/15   Pricilla Riffle, MD  potassium chloride SA (KLOR-CON M20) 20 MEQ tablet TAKE TWO TABLETS BY MOUTH IN THE AM & TAKE ONE TABLET BY MOUTH IN THE PM. 01/01/16   Pricilla Riffle, MD  risperiDONE (RISPERDAL) 2 MG tablet Take 1-4 mg by mouth 3 (three) times daily. Take 1 mg every morning Take 1 mg at lunch Take 4 mg at bedtime    Historical Provider, MD  thiamine 100 MG tablet Take 100 mg by mouth daily.      Historical Provider, MD  traZODone (DESYREL) 150 MG tablet Take 150 mg by mouth at bedtime.  09/24/15   Historical Provider, MD   There were no vitals taken for this visit. Physical Exam  Constitutional: He appears well-developed and well-nourished. No distress.  HENT:  Head: Atraumatic.  Mouth/Throat: Oropharynx is clear and moist.  Eyes: Conjunctivae are normal. Pupils are equal, round, and reactive to light. No scleral icterus.  Neck: Neck supple. No tracheal deviation present. No thyromegaly present.  No stiffness or rigidity  Cardiovascular: Normal rate, regular rhythm, normal heart sounds and intact distal pulses.  Exam reveals no gallop and no friction rub.   No murmur heard. Pulmonary/Chest: Effort normal and breath sounds normal. No accessory muscle usage. No respiratory distress.  Abdominal: Soft. Bowel sounds are normal. He exhibits  no distension and no mass. There is no rebound and no guarding.  Mild epigastric tenderness.   Genitourinary:  No cva tenderness  Musculoskeletal: Normal range of motion. He exhibits no edema or tenderness.  CTLS spine, non tender, aligned, no step off.   Neurological: He is alert.  Oriented x person/place. Speech clear/fluent. Motor intact bil. stre 5/5. sens grossly intact.   Skin: Skin is warm and dry. No rash noted. He is not diaphoretic.  Psychiatric: He has a normal mood and affect.  Nursing note and vitals reviewed.   ED Course  Procedures (including critical care time) Labs Review  Results for orders placed or  performed during the hospital encounter of 02/05/16  CBC  Result Value Ref Range   WBC 17.5 (H) 4.0 - 10.5 K/uL   RBC 4.29 4.22 - 5.81 MIL/uL   Hemoglobin 11.9 (L) 13.0 - 17.0 g/dL   HCT 16.1 (L) 09.6 - 04.5 %   MCV 76.0 (L) 78.0 - 100.0 fL   MCH 27.7 26.0 - 34.0 pg   MCHC 36.5 (H) 30.0 - 36.0 g/dL   RDW 40.9 81.1 - 91.4 %   Platelets 507 (H) 150 - 400 K/uL  Comprehensive metabolic panel  Result Value Ref Range   Sodium 120 (L) 135 - 145 mmol/L   Potassium 3.0 (L) 3.5 - 5.1 mmol/L   Chloride 86 (L) 101 - 111 mmol/L   CO2 22 22 - 32 mmol/L   Glucose, Bld 104 (H) 65 - 99 mg/dL   BUN <5 (L) 6 - 20 mg/dL   Creatinine, Ser 7.82 0.61 - 1.24 mg/dL   Calcium 8.4 (L) 8.9 - 10.3 mg/dL   Total Protein 6.6 6.5 - 8.1 g/dL   Albumin 3.4 (L) 3.5 - 5.0 g/dL   AST 36 15 - 41 U/L   ALT 22 17 - 63 U/L   Alkaline Phosphatase 99 38 - 126 U/L   Total Bilirubin 1.0 0.3 - 1.2 mg/dL   GFR calc non Af Amer >60 >60 mL/min   GFR calc Af Amer >60 >60 mL/min   Anion gap 12 5 - 15  Troponin I  Result Value Ref Range   Troponin I <0.03 <0.031 ng/mL  Lipase, blood  Result Value Ref Range   Lipase 34 11 - 51 U/L   Dg Chest 2 View  02/05/2016  CLINICAL DATA:  History of recent subdural hematoma after a fall. Altered mental status. EXAM: CHEST  2 VIEW COMPARISON:  Chest CT  01/28/2016 and chest x-ray 01/27/2016. FINDINGS: The heart is borderline enlarged. Low lung volumes with vascular crowding and streaky bibasilar atelectasis. There is also vascular congestion and possible mild interstitial edema. No definite pleural effusions. No pneumothorax. Healed and acute left rib fractures are again demonstrated. IMPRESSION: Low lung volumes with vascular crowding and bibasilar atelectasis. Vascular congestion and possible mild interstitial edema. Healed and acute left rib fractures again demonstrated. Electronically Signed   By: Rudie Meyer M.D.   On: 02/05/2016 18:30   Ct Head Wo Contrast  02/05/2016  CLINICAL DATA:  Fall on 01/25/2016 with subdural hematoma. Altered mental status with increasing confusion and lack of coordination. Multiple syncopal of the ITs. EXAM: CT HEAD WITHOUT CONTRAST TECHNIQUE: Contiguous axial images were obtained from the base of the skull through the vertex without intravenous contrast. COMPARISON:  01/28/2016 FINDINGS: There is trace residual subdural hematoma anterior to the right frontal lobe, decreased from prior. The small volume subdural hematoma along the falx on the prior study has also largely resolved, with at most trace residual blood anteriorly. There is no evidence of acute large territory infarct, mass, or midline shift. Chronic infarcts are again seen in the right basal ganglia and right parietal lobe. There is moderate cerebral atrophy. The orbits are unremarkable. Minimal residual right parietal scalp soft tissue swelling remains. Paranasal sinuses and mastoid air cells are clear. No skull fracture is identified. Calcified atherosclerosis is noted at the skullbase. IMPRESSION: 1. Near complete resolution of right-sided subdural hematomas. 2. No evidence of new intracranial abnormality. Electronically Signed   By: Sebastian Ache M.D.   On: 02/05/2016 19:42      I  have personally reviewed and evaluated these images and lab results as part of  my medical decision-making.   EKG Interpretation   Date/Time:  Thursday February 05 2016 17:34:31 EDT Ventricular Rate:  94 PR Interval:  168 QRS Duration: 86 QT Interval:  393 QTC Calculation: 491 R Axis:   66 Text Interpretation:  Sinus rhythm Probable left atrial enlargement No  significant change since last tracing Confirmed by Denton LankSTEINL  MD, Caryn BeeKEVIN  ([id number]) on 02/05/2016 5:39:40 PM      MDM   Labs. Imaging studies.  Reviewed nursing notes and prior charts for additional history.   From labs, Na very low, 120.  Iv ns boluses.  Recheck pt alert, content, answering questions appropriately. No new c/o.     No apparent infection on exam. ua pending.   PCP service/Family Practice [facility] contacted for admission.     Cathren LaineKevin Emmelina Mcloughlin, MD 02/05/16 [phone]

## 2016-02-05 NOTE — Progress Notes (Signed)
ELECTROPHYSIOLOGY CONSULT NOTE  Patient ID: Jose Li, MRN: [id number], DOB/AGE: 1956/06/17 60 y.o. Admit date: (Not on file) Date of Consult: 02/05/2016  Primary Physician: Almon Hercules, MD Primary Cardiologist: PR Consulting Physician PR  Chief Complaint: falls   HPI Jose Li is a 60 y.o. male  Seen for orthostatic hypotension in the context of a history of hypertension. He has had recurrent syncope. He was seen in the emergency room 3/14 following a fall. He was noted to have a subdural hematoma. He comes in with his father today who said that his behavior has become increasingly variable following this fall. There has been no neurosurgical or radiographic follow-up since then.  He has a history of orthostatic hypotension, at least in the context of EPIC he has scant documentation of orthostatic hypotension with BP cahnge of abut 30mm.      Past history is notable for substance abuse and seizure disorder context of bipolar disease. These have been treated with Respirdal and Cogentin in addition he also takes Cymbalta, Lyrica,Desyrel  All all of these have been associated with dizziness as well as orthostatic hypotension   He is treated in the past with Florinef complicated by hypokalemia, pindolol and Midrin.      Past Medical History  Diagnosis Date  . Allergy   . Anxiety   . Depression   . Hyperlipidemia   . Substance abuse   . Seizures (HCC)     none for last 6-96months  . Hypertension, essential, benign 08/02/2012  . Orthostatic hypotension 10/09/2013  . OSA (obstructive sleep apnea) 10/01/2015    Severe OSA with an AHI of 86/hr now on BiPAP at 23/19cm H2O      Surgical History:  Past Surgical History  Procedure Laterality Date  . Fracture surgery      right hip and femur  . Joint replacement      right hip  . Tracheostomy  08/1999    Had pneumoniaand was in a coma 10 days  . Hernia repair  07/2009    umbilical hernia/ got infected     Home  Meds: Prior to Admission medications   Medication Sig Start Date End Date Taking? Authorizing Provider  aspirin 81 MG EC tablet Take 81 mg by mouth daily.     Yes Historical Provider, MD  atorvastatin (LIPITOR) 40 MG tablet Take 1 tablet (40 mg total) by mouth daily. 01/06/16  Yes Almon Hercules, MD  benztropine (COGENTIN) 1 MG tablet Take 3 mg by mouth 3 (three) times daily. TAKE 1/2 TAB @6  AM, TAKE 1/2 TAB,  10 AM  TAKE 2 MG @ BEDTIME TOTAL OF 3 MG   Yes Historical Provider, MD  cetirizine (ZYRTEC) 10 MG tablet Take 1 tablet (10 mg total) by mouth daily. 07/18/15  Yes Almon Hercules, MD  DULoxetine (CYMBALTA) 60 MG capsule Take 60 mg by mouth 2 (two) times daily.    Yes Historical Provider, MD  levETIRAcetam (KEPPRA) 750 MG tablet TAKE TWO TABLETS BY MOUTH TWICE DAILY. 10/30/15  Yes Nilda Riggs, NP  LYRICA 75 MG capsule TAKE ONE CAPSULE BY MOUTH TWICE DAILY 10/15/15  Yes York Spaniel, MD  midodrine (PROAMATINE) 5 MG tablet Take 1 tablet by mouth at 6 am and 1 tablet 10 am and 1 tablet at 2 pm daily. 01/05/16  Yes Pricilla Riffle, MD  oxyCODONE 10 MG TABS Take 1 tablet (10 mg total) by mouth every 4 (four) hours  as needed for severe pain. 01/28/16  Yes Jimmye Norman, MD  pindolol (VISKEN) 5 MG tablet TAKE ONE-HALF TABLET BY MOUTH TWICE DAILY 10/03/15  Yes Pricilla Riffle, MD  potassium chloride SA (KLOR-CON M20) 20 MEQ tablet TAKE TWO TABLETS BY MOUTH IN THE AM & TAKE ONE TABLET BY MOUTH IN THE PM. 01/01/16  Yes Pricilla Riffle, MD  risperiDONE (RISPERDAL) 2 MG tablet Take 1-4 mg by mouth 3 (three) times daily. Take 1 mg every morning Take 1 mg at lunch Take 4 mg at bedtime   Yes Historical Provider, MD  thiamine 100 MG tablet Take 100 mg by mouth daily.     Yes Historical Provider, MD  traZODone (DESYREL) 150 MG tablet Take 150 mg by mouth at bedtime.  09/24/15  Yes Historical Provider, MD    Allergies:  Allergies  Allergen Reactions  . Erythromycin   . Hctz [Hydrochlorothiazide]     "Makes him  faint" per patient Dr. Tenny Craw ([place]) took him off  . Propranolol     "Makes him faint" per patient Dr. Tenny Craw ([place]) took him off      Social History   Social History  . Marital Status: Single    Spouse Name: N/A  . Number of Children: N/A  . Years of Education: N/A   Occupational History  . Not on file.   Social History Main Topics  . Smoking status: Current Every Day Smoker -- 0.30 packs/day for 30 years    Types: Cigarettes  . Smokeless tobacco: Never Used  . Alcohol Use: No     Comment: history of alcohol abuse.  . Drug Use: No  . Sexual Activity: Not Currently   Other Topics Concern  . Not on file   Social History Narrative     Family History  Problem Relation Age of Onset  . Heart disease Mother   . Diabetes Father   . Heart disease Brother   . Heart attack Brother   . Hypertension Neg Hx   . Stroke Neg Hx      ROS:  Please see the history of present illness.     All other systems reviewed and negative.    Physical Exam:  Blood pressure 150/90, pulse 88, height 6' (1.829 m), weight 200 lb 12.8 oz (91.082 kg). General: Well developed, well nourished male in no acute distress. Head: Normocephalic, atraumatic, sclera non-icteric, no xanthomas, nares are without discharge. EENT: normal  Lymph Nodes:  none Neck: Negative for carotid bruits. JVD not elevated. Back:without scoliosis kyphosis Lungs: Clear bilaterally to auscultation without wheezes, rales, or rhonchi. Breathing is unlabored. Heart: RRR with S1 S2. No  murmur . No rubs, or gallops appreciated. Abdomen: Soft, non-tender, non-distended with normoactive bowel sounds. No hepatomegaly. No rebound/guarding. No obvious abdominal masses. Msk:  Strength and tone appear normal for age. Extremities: No clubbing or cyanosis. No  edema.  Distal pedal pulses are 2+ and equal bilaterally. Skin: Warm and Dry Neuro: Alert and oriented X 3. CN III-XII intact Grossly normal sensory and motor function . Psych:   Responds to questions appropriately with an agitated affect      Labs: Cardiac Enzymes No results for input(s): CKTOTAL, CKMB, TROPONINI in the last 72 hours. CBC Lab Results  Component Value Date   WBC 15.6* 01/27/2016   HGB 12.1* 01/27/2016   HCT 35.9* 01/27/2016   MCV 81.6 01/27/2016   PLT 385 01/27/2016   PROTIME: No results for input(s): LABPROT, INR in the last 72  hours. Chemistry No results for input(s): NA, K, CL, CO2, BUN, CREATININE, CALCIUM, PROT, BILITOT, ALKPHOS, ALT, AST, GLUCOSE in the last 168 hours.  Invalid input(s): LABALBU Lipids Lab Results  Component Value Date   CHOL 176 12/29/2015   HDL 33* 12/29/2015   LDLCALC 64 12/29/2015   TRIG 393* 12/29/2015   BNP No results found for: PROBNP Thyroid Function Tests: No results for input(s): TSH, T4TOTAL, T3FREE, THYROIDAB in the last 72 hours.  Invalid input(s): FREET3 Miscellaneous No results found for: DDIMER  Radiology/Studies:  Dg Ribs Bilateral W/chest  01/27/2016  CLINICAL DATA:  Initial encounter for FELL DOWN STEPS ON Saturday,PAIN LOWER ANTERIOR BILATERAL RIBS.LEFT > RIGHT EXAM: BILATERAL RIBS AND CHEST - 4+ VIEW COMPARISON:  Chest radiograph 07/26/2009 FINDINGS: Frontal view of the chest and multiple views of bilateral ribs. Frontal view of the chest demonstrates sixth posterior lateral left rib remote trauma. Midline trachea. Normal heart size and mediastinal contours. No pleural effusion or pneumothorax. Mild biapical pleural thickening. Clear lungs. Dedicated rib radiographs demonstrate subtle irregularity about the approximately 8th-9th anterior lateral left rib. IMPRESSION: Minimally displaced fracture of a lower left anterior rib, likely subacute. Remote upper left rib fracture. No pleural fluid, pneumothorax, or other acute finding. Electronically Signed   By: Jeronimo Greaves M.D.   On: 01/27/2016 14:16   Dg Wrist Complete Right  01/28/2016  CLINICAL DATA:  Fall down steps 2 days ago with pain in  the right wrist ever since. Initial encounter. EXAM: RIGHT WRIST - COMPLETE 3+ VIEW COMPARISON:  None. FINDINGS: Nondisplaced fracture through the radial styloid. Normal radiocarpal alignment. IMPRESSION: Nondisplaced radial styloid fracture. Electronically Signed   By: Marnee Spring M.D.   On: 01/28/2016 00:38   Ct Head Wo Contrast  01/28/2016  CLINICAL DATA:  Four syncopal episodes yesterday, fell. Tinnitus. History of seizures, migraines, substance abuse, hyperlipidemia. EXAM: CT HEAD WITHOUT CONTRAST TECHNIQUE: Contiguous axial images were obtained from the base of the skull through the vertex without intravenous contrast. COMPARISON:  None. FINDINGS: 4 mm parafalcine dense subdural hematoma. Trace RIGHT frontal subdural hematoma. Minimal subarachnoid hemorrhage versus subdural hematoma RIGHT parietal convexity. Moderate ventriculomegaly on the basis of global parenchymal brain volume loss. Old RIGHT basal ganglia lacunar infarct. Small area RIGHT parietal encephalomalacia. No intraparenchymal hemorrhage, mass effect nor midline shift. No acute large vascular territory infarcts. Basal cisterns are patent. Mild calcific atherosclerosis the carotid siphons. No skull fracture. Moderate RIGHT parieto-occipital scalp hematoma with subcutaneous gas, no radiopaque foreign bodies. The included ocular globes and orbital contents are non-suspicious. The mastoid aircells and included paranasal sinuses are well-aerated. Patient is edentulous. IMPRESSION: 4 mm acute parafalcine subdural hematoma. Trace RIGHT frontal subdural hematoma. Minimal RIGHT extra-axial blood products. Moderate RIGHT posterior scalp hematoma and laceration. No skull fracture. Moderate global parenchymal brain volume loss, advanced for age. Old RIGHT basal ganglia lacunar infarct. Small RIGHT parietal infarct. Acute findings discussed with and reconfirmed by Dr.ONI on 01/28/2016 at 12:30 am. Electronically Signed   By: Awilda Metro M.D.   On:  01/28/2016 00:30   Ct Chest W Contrast  01/28/2016  CLINICAL DATA:  Status post fall during syncopal episode. Acute onset of left upper quadrant abdominal pain. Initial encounter. EXAM: CT CHEST, ABDOMEN, AND PELVIS WITH CONTRAST TECHNIQUE: Multidetector CT imaging of the chest, abdomen and pelvis was performed following the standard protocol during bolus administration of intravenous contrast. CONTRAST:  OMNIPAQUE IOHEXOL 300 MG/ML  SOLN COMPARISON:  Chest radiograph performed 01/27/2016, and CT of the abdomen  and pelvis performed 07/26/2009 FINDINGS: CT CHEST Mild bibasilar atelectasis is noted. The lungs are otherwise clear. No pleural effusion or pneumothorax is seen. No masses are identified. There is no evidence of pulmonary parenchymal contusion. The mediastinum is unremarkable in appearance. There is no evidence of venous hemorrhage. No mediastinal lymphadenopathy is seen. No pericardial effusion is identified. The great vessels are grossly unremarkable in appearance. The visualized portions of the thyroid gland are unremarkable. No axillary lymphadenopathy is seen. There is no evidence of significant soft injury along the chest wall. There appear to be minimally displaced acute fractures of the left lateral fifth through tenth ribs, with additional chronic rib deformities noted on the left side. CT ABDOMEN AND PELVIS No free air or free fluid is seen within the abdomen or pelvis. There is no evidence of solid or hollow organ injury. The liver and spleen are unremarkable in appearance. The gallbladder is within normal limits. The pancreas and adrenal glands are unremarkable. Nonspecific perinephric stranding is noted bilaterally. A 1.6 cm left renal cyst is noted. There is no evidence of hydronephrosis. No renal or ureteral stones are identified. No free fluid is identified. The small bowel is unremarkable in appearance. The stomach is within normal limits. No acute vascular abnormalities are seen.  Minimal calcification is noted at the distal abdominal aorta. The appendix is not definitely characterized; there is no evidence for appendicitis. Minimal diverticulosis is noted along the descending colon, without evidence of diverticulitis. The bladder is is moderately distended and grossly unremarkable. The prostate remains normal in size. Small bilateral inguinal hernias are seen, containing only fat. No inguinal lymphadenopathy is seen. No acute osseous abnormalities are identified. The patient's right femoral hardware is only partially imaged. IMPRESSION: 1. Minimally displaced acute fractures of the left lateral fifth through tenth ribs. 2. Mild bibasilar atelectasis noted.  Lungs otherwise clear. 3. No evidence of traumatic injury to the abdomen or pelvis. 4. Small left renal cyst noted. 5. Minimal diverticulosis along the descending colon without evidence of diverticulitis. 6. Small bilateral inguinal hernias, containing only fat. Electronically Signed   By: Roanna Raider M.D.   On: 01/28/2016 02:05   Ct Abdomen Pelvis W Contrast  01/28/2016  CLINICAL DATA:  Status post fall during syncopal episode. Acute onset of left upper quadrant abdominal pain. Initial encounter. EXAM: CT CHEST, ABDOMEN, AND PELVIS WITH CONTRAST TECHNIQUE: Multidetector CT imaging of the chest, abdomen and pelvis was performed following the standard protocol during bolus administration of intravenous contrast. CONTRAST:  OMNIPAQUE IOHEXOL 300 MG/ML  SOLN COMPARISON:  Chest radiograph performed 01/27/2016, and CT of the abdomen and pelvis performed 07/26/2009 FINDINGS: CT CHEST Mild bibasilar atelectasis is noted. The lungs are otherwise clear. No pleural effusion or pneumothorax is seen. No masses are identified. There is no evidence of pulmonary parenchymal contusion. The mediastinum is unremarkable in appearance. There is no evidence of venous hemorrhage. No mediastinal lymphadenopathy is seen. No pericardial effusion is  identified. The great vessels are grossly unremarkable in appearance. The visualized portions of the thyroid gland are unremarkable. No axillary lymphadenopathy is seen. There is no evidence of significant soft injury along the chest wall. There appear to be minimally displaced acute fractures of the left lateral fifth through tenth ribs, with additional chronic rib deformities noted on the left side. CT ABDOMEN AND PELVIS No free air or free fluid is seen within the abdomen or pelvis. There is no evidence of solid or hollow organ injury. The liver and spleen are  unremarkable in appearance. The gallbladder is within normal limits. The pancreas and adrenal glands are unremarkable. Nonspecific perinephric stranding is noted bilaterally. A 1.6 cm left renal cyst is noted. There is no evidence of hydronephrosis. No renal or ureteral stones are identified. No free fluid is identified. The small bowel is unremarkable in appearance. The stomach is within normal limits. No acute vascular abnormalities are seen. Minimal calcification is noted at the distal abdominal aorta. The appendix is not definitely characterized; there is no evidence for appendicitis. Minimal diverticulosis is noted along the descending colon, without evidence of diverticulitis. The bladder is is moderately distended and grossly unremarkable. The prostate remains normal in size. Small bilateral inguinal hernias are seen, containing only fat. No inguinal lymphadenopathy is seen. No acute osseous abnormalities are identified. The patient's right femoral hardware is only partially imaged. IMPRESSION: 1. Minimally displaced acute fractures of the left lateral fifth through tenth ribs. 2. Mild bibasilar atelectasis noted.  Lungs otherwise clear. 3. No evidence of traumatic injury to the abdomen or pelvis. 4. Small left renal cyst noted. 5. Minimal diverticulosis along the descending colon without evidence of diverticulitis. 6. Small bilateral inguinal  hernias, containing only fat. Electronically Signed   By: Roanna RaiderJeffery  Chang M.D.   On: 01/28/2016 02:05    EKG:  Not obtained  Assessment and Plan:  Subdural hematoma  Orthostatic hypotension  Polypharmacy  Bipolar disorder   The patient has a long-standing history of orthostatic hypotension with symptoms somewhat disproportional to the objective findings.  Perhaps most acute note is that he fell 10 days ago was undergone subdural hematoma and according to his caregiver 58 year old father his behavior has been increasingly abnormal and he increasingly agitated since this fall. No follow-up imaging or evaluation has yet been done. I am most concerned about this and we will send him to the emergency room for repeat evaluation  As relates to his orthostatic hypotension, it's hard to know how to begin. It is likely the case although his objective measurements today are not all that impressive nor have they been in the past. Still, he is on at least 7 medications (Cogentin, Cymbalta, Keppra, Visken, Lyrica, risperidone,Desyrel .) which can cause or aggravate orthostatic hypotension. Most of these are neurological/psychiatric the 1 cardiac medication being pindolol.  Another medication to add to the regime might be Mestinon instead of Midrin; I would be in favor of stopping the pindolol and I would recommend the use of an abdominal binder is nonpharmacological intervention. However, I think the most important intervention may well be input from neuropsychiatry for modification of his medical regime.  We'll be glad to see him again as needed   Sherryl MangesSteven Kalene Cutler

## 2016-02-05 NOTE — ED Notes (Signed)
Complaints of pain in left to mid rib area. MD aware.

## 2016-02-05 NOTE — H&P (Signed)
Family Medicine Teaching [facility] Admission History and Physical Service Pager: [phone]  Patient name: Jose Li Medical record number: [id number] Date of birth: 15-Sep-1956 Age: 60 y.o. Gender: male  Primary Care Provider: Almon Hercules, MD Consultants: none Code Status: Full  Chief Complaint: confused  Assessment and Plan: Jose Li is a 60 y.o. male presenting with confusion, hyponatremia. PMH is significant for recent head trauma with multiple small SDH, recent syncopal episodes, orthostatic hypotension, anxiety/depression/bipolar, seizures, OSA.  # Toxic metabolic encephalopathy likely secondary to hyponatremia: sodium 120, down from 133-134 9 days ago. Vitals stable, borderline tachycardic. Other lab abnormalities include K+ 3.0, WBC 17.5 (up from 15 on 3/14). EKG tachy/sinus rhythm. Repeat CT head showed resolved SDH, no other pathology. Differential for encephalopathy includes hyponatremia, infection (leukocytosis and borderline fever) but no obvious source, medications (on multiple psych medications). Hyponatremia could be due to SIADH vs cerebral salt wasting vs polydipsia vs medications (of note risperdal could be a cause of SIADH). He received 2 x 500cc NS bolus in ED. - admit to telemetry - urine osmolality, urine sodium and creatinine - serum osmolality - repeat BMP q4hrs - if apparent SIADH will switch fluids to 3% hypertonic (low infusion rate)  # Leukocytosis and borderline fever: WBC 17.5, up slightly from ED visit where it was 15. Temp 100.2F initially in ED, improved to 99.59F. Not hypotensive. No clear source of infection. - collect blood cultures - repeat CBC in AM - if spikes fevers, becomes hypotensive, will start empiric antibiotics  # Syncopal episodes: seen by EP today as outpatient, noted he is on multiple medicines that can contribute to orthostasis. EKG in ED without arrhythmia.  - telemetry  # Hypokalemia: 3.0 on admission, repleted x 1 -  await repeat BMP  # Abdominal pain: difficult to elicit full history, sounds to be intermittent. Reported diarrhea yesterday, no BM today. Tympanic/mildly distended appearing abdomen but no guarding/rebound. KUB was unremarkable.  - monitor clinically  # Orthostatic hypotension: on midodrine. BPs in hypertensive range currently - ordered midodrine dosing to continue tomorrow but may need to be held if BP elevated and not ambulatory.  # History of seizures: none recently - continue keppra  twice daily  - continue lyrica  twice daily   # Psych: multiple mentions of anxiety/depression/bipolar in history/Epic chart.  - home medications include cymbalta, risperdal, lyrica - consider psych consult, likely needs medication adjustments in light of syncopal episodes above  FEN/GI: fluids pending repeat labs. Prophylaxis: SCDs  Disposition: admit  History of Present Illness:  Jose Li is a 60 y.o. male presenting with confusion, falls. History provided by patient and his father at the bedside. Father reports that over past month patient has been falling and passing out, on 3/12 passed out on the stairs; presented to ED on 3/14 because of this and found to have several small right sided subdural hematomas; discharged from ED. On arriving home he continued to have several syncopal episodes, including 3 the day after the ED visit. Father and patient both started noticing confusion, worse over past several days. Patient does not specifically add any other symptoms. When asked he says that he has noticed some weakness of his upper extremities, he has pain in his abdomen that he describes as around belly button, comes and goes (when worse it is very bad and lasts a few minutes), and also felt fevers over past 24 hours but no temperature measured. He denies headache, neck pain, changes in  vision. He says he has not felt more thirsty, did not drink a large amount of water over past several days but  did drink some water (denies >64oz in last 24 hours). He has been urinating like normal. He has had no BM today, says he had a "different" bowel movement yesterday that was diarrhea. He denies cough, runny nose, muscle aches, burning with urination, new rash. He does have a cut on the back of his head from the fall on 3/12 but this has been kept clean and has not drained any pus or gotten swollen/red. He denies any recent alcohol or drug use.  Review Of Systems: Per HPI with the following additions: none Otherwise the remainder of the systems were negative.  Patient Active Problem List   Diagnosis Date Noted  . SDH (subdural hematoma) (HCC) 01/28/2016  . Vertigo 12/29/2015  . OSA (obstructive sleep apnea) 10/01/2015  . Orthostatic hypotension 10/09/2013  . Healthcare maintenance 09/21/2013  . Leg swelling 09/01/2012  . Hypertension, essential, benign 08/02/2012  . Routine history and physical examination of adult 02/15/2011  . OTHER DISORDERS OF EAR 05/12/2010  . HERNIA, VENTRAL 04/10/2009  . TOBACCO ABUSE 12/14/2007  . HYPERLIPIDEMIA 10/09/2007  . SYNCOPE 08/29/2007  . Epilepsy (HCC) 05/10/2007  . BIPOLAR DISORDER 01/12/2007  . MIGRAINE, UNSPEC., W/O INTRACTABLE MIGRAINE 01/12/2007    Past Medical History: Past Medical History  Diagnosis Date  . Allergy   . Anxiety   . Depression   . Hyperlipidemia   . Substance abuse   . Seizures (HCC)     none for last 6-437months  . Hypertension, essential, benign 08/02/2012  . Orthostatic hypotension 10/09/2013  . OSA (obstructive sleep apnea) 10/01/2015    Severe OSA with an AHI of 86/hr now on BiPAP at 23/19cm H2O    Past Surgical History: Past Surgical History  Procedure Laterality Date  . Fracture surgery      right hip and femur  . Joint replacement      right hip  . Tracheostomy  08/1999    Had pneumoniaand was in a coma 10 days  . Hernia repair  07/2009    umbilical hernia/ got infected    Social History: Social History   Substance Use Topics  . Smoking status: Current Every Day Smoker -- 0.30 packs/day for 30 years    Types: Cigarettes  . Smokeless tobacco: Never Used  . Alcohol Use: No     Comment: history of alcohol abuse.   Additional social history: lives with 58 year old father  Please also refer to relevant sections of EMR.  Family History: Family History  Problem Relation Age of Onset  . Heart disease Mother   . Diabetes Father   . Heart disease Brother   . Heart attack Brother   . Hypertension Neg Hx   . Stroke Neg Hx    Allergies and Medications: Allergies  Allergen Reactions  . Erythromycin   . Hctz [Hydrochlorothiazide]     "Makes him faint" per patient Dr. Tenny Crawoss ([place]) took him off  . Propranolol     "Makes him faint" per patient Dr. Tenny Crawoss ([place]) took him off     No current facility-administered medications on file prior to encounter.   Current Outpatient Prescriptions on File Prior to Encounter  Medication Sig Dispense Refill  . aspirin 81 MG EC tablet Take 81 mg by mouth daily.      Marland Kitchen. atorvastatin (LIPITOR) 40 MG tablet Take 1 tablet (40 mg total) by mouth  daily. 30 tablet 11  . benztropine (COGENTIN) 1 MG tablet Take 3 mg by mouth 3 (three) times daily. TAKE 1/2 TAB @6  AM, TAKE 1/2 TAB,  10 AM  TAKE 2 MG @ BEDTIME TOTAL OF 3 MG    . cetirizine (ZYRTEC) 10 MG tablet Take 1 tablet (10 mg total) by mouth daily. 30 tablet 9  . DULoxetine (CYMBALTA) 60 MG capsule Take 60 mg by mouth 2 (two) times daily.     Marland Kitchen levETIRAcetam (KEPPRA) 750 MG tablet TAKE TWO TABLETS BY MOUTH TWICE DAILY. 360 tablet 3  . LYRICA 75 MG capsule TAKE ONE CAPSULE BY MOUTH TWICE DAILY 60 capsule 3  . midodrine (PROAMATINE) 5 MG tablet Take 1 tablet by mouth at 6 am and 1 tablet 10 am and 1 tablet at 2 pm daily. (Patient taking differently: Take 5 mg by mouth See admin instructions. Take 1 tablet by mouth at 6 am and 1 tablet 10 am and 1 tablet at 2 pm daily.) 90 tablet 11  . oxyCODONE 10 MG TABS Take 1  tablet (10 mg total) by mouth every 4 (four) hours as needed for severe pain. 30 tablet 0  . pindolol (VISKEN) 5 MG tablet TAKE ONE-HALF TABLET BY MOUTH TWICE DAILY 30 tablet 9  . potassium chloride SA (KLOR-CON M20) 20 MEQ tablet TAKE TWO TABLETS BY MOUTH IN THE AM & TAKE ONE TABLET BY MOUTH IN THE PM. 270 tablet 2  . risperiDONE (RISPERDAL) 2 MG tablet Take 1-4 mg by mouth 3 (three) times daily. Take 1 mg every morning Take 1 mg at lunch Take 4 mg at bedtime    . thiamine 100 MG tablet Take 100 mg by mouth daily.      . traZODone (DESYREL) 150 MG tablet Take 150 mg by mouth at bedtime.       Objective: BP 164/92 mmHg  Pulse 92  Temp(Src) 100.3 F (37.9 C) (Oral)  Resp 22  Ht 6' (1.829 m)  Wt 200 lb (90.719 kg)  BMI 27.12 kg/m2  SpO2 100% Exam: General: no apparent distress, moving around in bed constantly Eyes: PERRL, EOMI, normal conjunctiva and sclera.  ENTM: patent nares bilaterally, no posterior pharyngeal erythema, mucous membranes appear dry. Head: there is a well healing laceration right posterior scalp, healing by secondary intention, no surrounding swelling, fluctuance, or erythema Neck: supple, FROM Cardiovascular: borderline tachycardia, normal rhythm, no murmur appreciated. 2+ left radial pulse (cast on right wrist) Respiratory: clear to auscultation bilaterally, normal effort Abdomen: soft, mildly distended appearing, minimal tenderness to palpation periumbilical areas, no rebound or guarding, tympanic to percussion, normal bowel sounds  MSK: right wrist/forearm with hard cast in place, no other major deformities noted Skin: no rashes seen, head laceration as above Neuro: alert to person, place, president but not time ("two thousand something" and "February"), cooperative with exam. CN 2-12 normal, strength testing normal in UE and LE, 2+ patellar reflexes bilaterally, heel to shin normal bilaterally, negative pronator drift. Psych: normal thought content, speech is  minimally dysarthric at times  Labs and Imaging: CBC BMET   Recent Labs Lab 02/05/16 1811  WBC 17.5*  HGB 11.9*  HCT 32.6*  PLT 507*    Recent Labs Lab 02/05/16 1811  NA 120*  K 3.0*  CL 86*  CO2 22  BUN <5*  CREATININE 0.90  GLUCOSE 104*  CALCIUM 8.4*     Dg Chest 2 View  02/05/2016  CLINICAL DATA:  History of recent subdural hematoma after a  fall. Altered mental status. EXAM: CHEST  2 VIEW COMPARISON:  Chest CT 01/28/2016 and chest x-ray 01/27/2016. FINDINGS: The heart is borderline enlarged. Low lung volumes with vascular crowding and streaky bibasilar atelectasis. There is also vascular congestion and possible mild interstitial edema. No definite pleural effusions. No pneumothorax. Healed and acute left rib fractures are again demonstrated. IMPRESSION: Low lung volumes with vascular crowding and bibasilar atelectasis. Vascular congestion and possible mild interstitial edema. Healed and acute left rib fractures again demonstrated. Electronically Signed   By: Rudie Meyer M.D.   On: 02/05/2016 18:30   Dg Ribs Bilateral W/chest  01/27/2016  CLINICAL DATA:  Initial encounter for FELL DOWN STEPS ON Saturday,PAIN LOWER ANTERIOR BILATERAL RIBS.LEFT > RIGHT EXAM: BILATERAL RIBS AND CHEST - 4+ VIEW COMPARISON:  Chest radiograph 07/26/2009 FINDINGS: Frontal view of the chest and multiple views of bilateral ribs. Frontal view of the chest demonstrates sixth posterior lateral left rib remote trauma. Midline trachea. Normal heart size and mediastinal contours. No pleural effusion or pneumothorax. Mild biapical pleural thickening. Clear lungs. Dedicated rib radiographs demonstrate subtle irregularity about the approximately 8th-9th anterior lateral left rib. IMPRESSION: Minimally displaced fracture of a lower left anterior rib, likely subacute. Remote upper left rib fracture. No pleural fluid, pneumothorax, or other acute finding. Electronically Signed   By: Jeronimo Greaves M.D.   On: 01/27/2016  14:16   Dg Wrist Complete Right  01/28/2016  CLINICAL DATA:  Fall down steps 2 days ago with pain in the right wrist ever since. Initial encounter. EXAM: RIGHT WRIST - COMPLETE 3+ VIEW COMPARISON:  None. FINDINGS: Nondisplaced fracture through the radial styloid. Normal radiocarpal alignment. IMPRESSION: Nondisplaced radial styloid fracture. Electronically Signed   By: Marnee Spring M.D.   On: 01/28/2016 00:38   Dg Abd 1 View  02/05/2016  CLINICAL DATA:  60 year old male with abdominal pain EXAM: ABDOMEN - 1 VIEW COMPARISON:  CT dated 01/28/2016 FINDINGS: There is no evidence of bowel obstruction for free air. No radiopaque calculi identified. The soft tissues are grossly unremarkable with there is osteopenia with degenerative changes of the spine. Partially visualized right femoral intra medullary hardware. Stop no acute fracture. IMPRESSION: Negative. Electronically Signed   By: Elgie Collard M.D.   On: 02/05/2016 21:44   Ct Head Wo Contrast  02/05/2016  CLINICAL DATA:  Fall on 01/25/2016 with subdural hematoma. Altered mental status with increasing confusion and lack of coordination. Multiple syncopal of the ITs. EXAM: CT HEAD WITHOUT CONTRAST TECHNIQUE: Contiguous axial images were obtained from the base of the skull through the vertex without intravenous contrast. COMPARISON:  01/28/2016 FINDINGS: There is trace residual subdural hematoma anterior to the right frontal lobe, decreased from prior. The small volume subdural hematoma along the falx on the prior study has also largely resolved, with at most trace residual blood anteriorly. There is no evidence of acute large territory infarct, mass, or midline shift. Chronic infarcts are again seen in the right basal ganglia and right parietal lobe. There is moderate cerebral atrophy. The orbits are unremarkable. Minimal residual right parietal scalp soft tissue swelling remains. Paranasal sinuses and mastoid air cells are clear. No skull fracture is  identified. Calcified atherosclerosis is noted at the skullbase. IMPRESSION: 1. Near complete resolution of right-sided subdural hematomas. 2. No evidence of new intracranial abnormality. Electronically Signed   By: Sebastian Ache M.D.   On: 02/05/2016 19:42   Ct Head Wo Contrast  01/28/2016  CLINICAL DATA:  Four syncopal episodes yesterday, fell. Tinnitus. History  of seizures, migraines, substance abuse, hyperlipidemia. EXAM: CT HEAD WITHOUT CONTRAST TECHNIQUE: Contiguous axial images were obtained from the base of the skull through the vertex without intravenous contrast. COMPARISON:  None. FINDINGS: 4 mm parafalcine dense subdural hematoma. Trace RIGHT frontal subdural hematoma. Minimal subarachnoid hemorrhage versus subdural hematoma RIGHT parietal convexity. Moderate ventriculomegaly on the basis of global parenchymal brain volume loss. Old RIGHT basal ganglia lacunar infarct. Small area RIGHT parietal encephalomalacia. No intraparenchymal hemorrhage, mass effect nor midline shift. No acute large vascular territory infarcts. Basal cisterns are patent. Mild calcific atherosclerosis the carotid siphons. No skull fracture. Moderate RIGHT parieto-occipital scalp hematoma with subcutaneous gas, no radiopaque foreign bodies. The included ocular globes and orbital contents are non-suspicious. The mastoid aircells and included paranasal sinuses are well-aerated. Patient is edentulous. IMPRESSION: 4 mm acute parafalcine subdural hematoma. Trace RIGHT frontal subdural hematoma. Minimal RIGHT extra-axial blood products. Moderate RIGHT posterior scalp hematoma and laceration. No skull fracture. Moderate global parenchymal brain volume loss, advanced for age. Old RIGHT basal ganglia lacunar infarct. Small RIGHT parietal infarct. Acute findings discussed with and reconfirmed by Dr.ONI on 01/28/2016 at 12:30 am. Electronically Signed   By: Awilda Metro M.D.   On: 01/28/2016 00:30   Ct Chest W Contrast  01/28/2016   CLINICAL DATA:  Status post fall during syncopal episode. Acute onset of left upper quadrant abdominal pain. Initial encounter. EXAM: CT CHEST, ABDOMEN, AND PELVIS WITH CONTRAST TECHNIQUE: Multidetector CT imaging of the chest, abdomen and pelvis was performed following the standard protocol during bolus administration of intravenous contrast. CONTRAST:  OMNIPAQUE IOHEXOL 300 MG/ML  SOLN COMPARISON:  Chest radiograph performed 01/27/2016, and CT of the abdomen and pelvis performed 07/26/2009 FINDINGS: CT CHEST Mild bibasilar atelectasis is noted. The lungs are otherwise clear. No pleural effusion or pneumothorax is seen. No masses are identified. There is no evidence of pulmonary parenchymal contusion. The mediastinum is unremarkable in appearance. There is no evidence of venous hemorrhage. No mediastinal lymphadenopathy is seen. No pericardial effusion is identified. The great vessels are grossly unremarkable in appearance. The visualized portions of the thyroid gland are unremarkable. No axillary lymphadenopathy is seen. There is no evidence of significant soft injury along the chest wall. There appear to be minimally displaced acute fractures of the left lateral fifth through tenth ribs, with additional chronic rib deformities noted on the left side. CT ABDOMEN AND PELVIS No free air or free fluid is seen within the abdomen or pelvis. There is no evidence of solid or hollow organ injury. The liver and spleen are unremarkable in appearance. The gallbladder is within normal limits. The pancreas and adrenal glands are unremarkable. Nonspecific perinephric stranding is noted bilaterally. A 1.6 cm left renal cyst is noted. There is no evidence of hydronephrosis. No renal or ureteral stones are identified. No free fluid is identified. The small bowel is unremarkable in appearance. The stomach is within normal limits. No acute vascular abnormalities are seen. Minimal calcification is noted at the distal abdominal  aorta. The appendix is not definitely characterized; there is no evidence for appendicitis. Minimal diverticulosis is noted along the descending colon, without evidence of diverticulitis. The bladder is is moderately distended and grossly unremarkable. The prostate remains normal in size. Small bilateral inguinal hernias are seen, containing only fat. No inguinal lymphadenopathy is seen. No acute osseous abnormalities are identified. The patient's right femoral hardware is only partially imaged. IMPRESSION: 1. Minimally displaced acute fractures of the left lateral fifth through tenth ribs. 2. Mild bibasilar  atelectasis noted.  Lungs otherwise clear. 3. No evidence of traumatic injury to the abdomen or pelvis. 4. Small left renal cyst noted. 5. Minimal diverticulosis along the descending colon without evidence of diverticulitis. 6. Small bilateral inguinal hernias, containing only fat. Electronically Signed   By: Roanna Raider M.D.   On: 01/28/2016 02:05   Ct Abdomen Pelvis W Contrast  01/28/2016  CLINICAL DATA:  Status post fall during syncopal episode. Acute onset of left upper quadrant abdominal pain. Initial encounter. EXAM: CT CHEST, ABDOMEN, AND PELVIS WITH CONTRAST TECHNIQUE: Multidetector CT imaging of the chest, abdomen and pelvis was performed following the standard protocol during bolus administration of intravenous contrast. CONTRAST:  OMNIPAQUE IOHEXOL 300 MG/ML  SOLN COMPARISON:  Chest radiograph performed 01/27/2016, and CT of the abdomen and pelvis performed 07/26/2009 FINDINGS: CT CHEST Mild bibasilar atelectasis is noted. The lungs are otherwise clear. No pleural effusion or pneumothorax is seen. No masses are identified. There is no evidence of pulmonary parenchymal contusion. The mediastinum is unremarkable in appearance. There is no evidence of venous hemorrhage. No mediastinal lymphadenopathy is seen. No pericardial effusion is identified. The great vessels are grossly unremarkable in  appearance. The visualized portions of the thyroid gland are unremarkable. No axillary lymphadenopathy is seen. There is no evidence of significant soft injury along the chest wall. There appear to be minimally displaced acute fractures of the left lateral fifth through tenth ribs, with additional chronic rib deformities noted on the left side. CT ABDOMEN AND PELVIS No free air or free fluid is seen within the abdomen or pelvis. There is no evidence of solid or hollow organ injury. The liver and spleen are unremarkable in appearance. The gallbladder is within normal limits. The pancreas and adrenal glands are unremarkable. Nonspecific perinephric stranding is noted bilaterally. A 1.6 cm left renal cyst is noted. There is no evidence of hydronephrosis. No renal or ureteral stones are identified. No free fluid is identified. The small bowel is unremarkable in appearance. The stomach is within normal limits. No acute vascular abnormalities are seen. Minimal calcification is noted at the distal abdominal aorta. The appendix is not definitely characterized; there is no evidence for appendicitis. Minimal diverticulosis is noted along the descending colon, without evidence of diverticulitis. The bladder is is moderately distended and grossly unremarkable. The prostate remains normal in size. Small bilateral inguinal hernias are seen, containing only fat. No inguinal lymphadenopathy is seen. No acute osseous abnormalities are identified. The patient's right femoral hardware is only partially imaged. IMPRESSION: 1. Minimally displaced acute fractures of the left lateral fifth through tenth ribs. 2. Mild bibasilar atelectasis noted.  Lungs otherwise clear. 3. No evidence of traumatic injury to the abdomen or pelvis. 4. Small left renal cyst noted. 5. Minimal diverticulosis along the descending colon without evidence of diverticulitis. 6. Small bilateral inguinal hernias, containing only fat. Electronically Signed   By:  Roanna Raider M.D.   On: 01/28/2016 02:05     Nani Ravens, MD 02/05/2016, 8:09 PM PGY-3, [facility]PT[facility] Intern pager: [phone], text pages welcome

## 2016-02-05 NOTE — ED Notes (Signed)
Dr.Goebel asked to placed US guided IV

## 2016-02-05 NOTE — ED Notes (Signed)
MD at bedside for US placement

## 2016-02-05 NOTE — Patient Instructions (Signed)
We are sending you to the hospital to follow up on passing out and behavioral changes since subdural hematoma on 3/14.  Follow up with Dietrich PatesPaula Ross, MD after hospital.

## 2016-02-05 NOTE — ED Notes (Signed)
Pt from [facility].  Pt fell on 3/12 and had a subdural hematoma and was d/c on 3/14.  Since pt was d/c family reports increasing confusion and lack of coordination.  Pt has had multiple syncopal events over the past week but was not seen by a physician for symptoms.  Pt sent by PMD today.

## 2016-02-05 NOTE — Telephone Encounter (Signed)
Called patient's father and informed him that Dr. Tenny Crawoss wants patient to be seen in our office today. Will find an appointment for patient and call back to confirm.   Left message for patient to call back to confirm appointment with Dr. Graciela HusbandsKlein today at 3:30 pm.

## 2016-02-05 NOTE — Telephone Encounter (Signed)
Patient has been scheduled with Dr. Graciela HusbandsKlein today at 3:30 pm.   Triage called and left message for patient regarding appointment today.

## 2016-02-05 NOTE — ED Notes (Signed)
Pt out of room to CT 

## 2016-02-06 DIAGNOSIS — E871 Hypo-osmolality and hyponatremia: Secondary | ICD-10-CM | POA: Insufficient documentation

## 2016-02-06 DIAGNOSIS — E876 Hypokalemia: Secondary | ICD-10-CM

## 2016-02-06 DIAGNOSIS — G92 Toxic encephalopathy: Secondary | ICD-10-CM

## 2016-02-06 DIAGNOSIS — R4182 Altered mental status, unspecified: Secondary | ICD-10-CM | POA: Insufficient documentation

## 2016-02-06 DIAGNOSIS — D72829 Elevated white blood cell count, unspecified: Secondary | ICD-10-CM

## 2016-02-06 DIAGNOSIS — R569 Unspecified convulsions: Secondary | ICD-10-CM | POA: Insufficient documentation

## 2016-02-06 DIAGNOSIS — R109 Unspecified abdominal pain: Secondary | ICD-10-CM | POA: Insufficient documentation

## 2016-02-06 DIAGNOSIS — R55 Syncope and collapse: Secondary | ICD-10-CM | POA: Insufficient documentation

## 2016-02-06 LAB — BASIC METABOLIC PANEL
ANION GAP: 7 (ref 5–15)
ANION GAP: 7 (ref 5–15)
ANION GAP: 9 (ref 5–15)
ANION GAP: 7 (ref 5–15)
Anion gap: 10 (ref 5–15)
Anion gap: 7 (ref 5–15)
Anion gap: 10 (ref 5–15)
BUN: <5 mg/dL — ABNORMAL LOW (ref 6–20)
BUN: <5 mg/dL — ABNORMAL LOW (ref 6–20)
BUN: <5 mg/dL — ABNORMAL LOW (ref 6–20)
BUN: <5 mg/dL — ABNORMAL LOW (ref 6–20)
BUN: <5 mg/dL — ABNORMAL LOW (ref 6–20)
BUN: <5 mg/dL — ABNORMAL LOW (ref 6–20)
CALCIUM: 8.5 mg/dL — AB (ref 8.9–10.3)
CALCIUM: 8.2 mg/dL — AB (ref 8.9–10.3)
CHLORIDE: 92 mmol/L — AB (ref 101–111)
CHLORIDE: 89 mmol/L — AB (ref 101–111)
CHLORIDE: 90 mmol/L — AB (ref 101–111)
CHLORIDE: 92 mmol/L — AB (ref 101–111)
CO2: 19 mmol/L — AB (ref 22–32)
CO2: 20 mmol/L — AB (ref 22–32)
CO2: 21 mmol/L — ABNORMAL LOW (ref 22–32)
CO2: 21 mmol/L — ABNORMAL LOW (ref 22–32)
CO2: 23 mmol/L (ref 22–32)
CO2: 23 mmol/L (ref 22–32)
CO2: 25 mmol/L (ref 22–32)
CREATININE: 0.72 mg/dL (ref 0.61–1.24)
CREATININE: 0.66 mg/dL (ref 0.61–1.24)
CREATININE: 0.7 mg/dL (ref 0.61–1.24)
CREATININE: 0.71 mg/dL (ref 0.61–1.24)
CREATININE: 0.78 mg/dL (ref 0.61–1.24)
Calcium: 7.8 mg/dL — ABNORMAL LOW (ref 8.9–10.3)
Calcium: 7.8 mg/dL — ABNORMAL LOW (ref 8.9–10.3)
Calcium: 7.8 mg/dL — ABNORMAL LOW (ref 8.9–10.3)
Calcium: 7.9 mg/dL — ABNORMAL LOW (ref 8.9–10.3)
Calcium: 8.2 mg/dL — ABNORMAL LOW (ref 8.9–10.3)
Chloride: 90 mmol/L — ABNORMAL LOW (ref 101–111)
Chloride: 90 mmol/L — ABNORMAL LOW (ref 101–111)
Chloride: 93 mmol/L — ABNORMAL LOW (ref 101–111)
Creatinine, Ser: 0.72 mg/dL (ref 0.61–1.24)
Creatinine, Ser: 0.72 mg/dL (ref 0.61–1.24)
GFR calc Af Amer: >60 mL/min (ref >60)
GFR calc Af Amer: >60 mL/min (ref >60)
GFR calc Af Amer: >60 mL/min (ref >60)
GFR calc non Af Amer: >60 mL/min (ref >60)
GFR calc non Af Amer: >60 mL/min (ref >60)
GFR calc non Af Amer: >60 mL/min (ref >60)
GFR calc non Af Amer: >60 mL/min (ref >60)
GFR calc non Af Amer: >60 mL/min (ref >60)
GLUCOSE: 111 mg/dL — AB (ref 65–99)
GLUCOSE: 105 mg/dL — AB (ref 65–99)
GLUCOSE: 105 mg/dL — AB (ref 65–99)
Glucose, Bld: 109 mg/dL — ABNORMAL HIGH (ref 65–99)
Glucose, Bld: 118 mg/dL — ABNORMAL HIGH (ref 65–99)
Glucose, Bld: 95 mg/dL (ref 65–99)
Glucose, Bld: 99 mg/dL (ref 65–99)
POTASSIUM: 3.3 mmol/L — AB (ref 3.5–5.1)
POTASSIUM: 3.4 mmol/L — AB (ref 3.5–5.1)
POTASSIUM: 3 mmol/L — AB (ref 3.5–5.1)
POTASSIUM: 3.1 mmol/L — AB (ref 3.5–5.1)
Potassium: 3.1 mmol/L — ABNORMAL LOW (ref 3.5–5.1)
Potassium: 3.2 mmol/L — ABNORMAL LOW (ref 3.5–5.1)
Potassium: 3.3 mmol/L — ABNORMAL LOW (ref 3.5–5.1)
SODIUM: 118 mmol/L — AB (ref 135–145)
SODIUM: 120 mmol/L — AB (ref 135–145)
SODIUM: 120 mmol/L — AB (ref 135–145)
SODIUM: 122 mmol/L — AB (ref 135–145)
SODIUM: 122 mmol/L — AB (ref 135–145)
Sodium: 122 mmol/L — ABNORMAL LOW (ref 135–145)
Sodium: 121 mmol/L — ABNORMAL LOW (ref 135–145)

## 2016-02-06 LAB — CBC
HCT: 28.6 % — ABNORMAL LOW (ref 39.0–52.0)
HEMOGLOBIN: 10.3 g/dL — AB (ref 13.0–17.0)
MCH: 27.5 pg (ref 26.0–34.0)
MCHC: 36 g/dL (ref 30.0–36.0)
MCV: 76.3 fL — ABNORMAL LOW (ref 78.0–100.0)
Platelets: 411 10*3/uL — ABNORMAL HIGH (ref 150–400)
RBC: 3.75 MIL/uL — AB (ref 4.22–5.81)
RDW: 12.4 % (ref 11.5–15.5)
WBC: 15.7 10*3/uL — AB (ref 4.0–10.5)

## 2016-02-06 LAB — CORTISOL-AM, BLOOD: Cortisol - AM: 20.9 ug/dL (ref 6.7–22.6)

## 2016-02-06 LAB — VITAMIN B12: Vitamin B-12: 473 pg/mL (ref 180–914)

## 2016-02-06 LAB — OSMOLALITY, URINE: Osmolality, Ur: 290 mOsm/kg — ABNORMAL LOW (ref 300–900)

## 2016-02-06 LAB — RAPID URINE DRUG SCREEN, HOSP PERFORMED
Amphetamines: NOT DETECTED
BENZODIAZEPINES: NOT DETECTED
Barbiturates: NOT DETECTED
Cocaine: NOT DETECTED
Opiates: NOT DETECTED
Tetrahydrocannabinol: NOT DETECTED

## 2016-02-06 LAB — INFLUENZA PANEL BY PCR (TYPE A & B)
H1N1 flu by pcr: NOT DETECTED
INFLAPCR: NEGATIVE
Influenza B By PCR: NEGATIVE

## 2016-02-06 LAB — ACTH STIMULATION, 3 TIME POINTS
CORTISOL 30 MIN: 29.2 ug/dL
CORTISOL 60 MIN: 30.2 ug/dL
Cortisol, Base: 21.2 ug/dL

## 2016-02-06 LAB — AMMONIA: AMMONIA: 65 umol/L — AB (ref 9–35)

## 2016-02-06 LAB — TSH: TSH: 1.518 u[IU]/mL (ref 0.350–4.500)

## 2016-02-06 LAB — SODIUM, URINE, RANDOM: Sodium, Ur: 80 mmol/L

## 2016-02-06 LAB — OSMOLALITY: Osmolality: 247 mOsm/kg — CL (ref 275–295)

## 2016-02-06 LAB — CREATININE, URINE, RANDOM: CREATININE, URINE: 75.31 mg/dL

## 2016-02-06 MED ORDER — POTASSIUM CHLORIDE CRYS ER 20 MEQ PO TBCR
20.0000 meq | EXTENDED_RELEASE_TABLET | Freq: Once | ORAL | Status: AC
  Administered 2016-02-06: 20 meq via ORAL
  Filled 2016-02-06: qty 1

## 2016-02-06 MED ORDER — CALCIUM CARBONATE 1250 (500 CA) MG PO TABS
1500.0000 mg | ORAL_TABLET | Freq: Once | ORAL | Status: AC
  Administered 2016-02-06: 1500 mg via ORAL
  Filled 2016-02-06 (×2): qty 2

## 2016-02-06 MED ORDER — SODIUM CHLORIDE 0.9 % IV BOLUS (SEPSIS)
500.0000 mL | Freq: Once | INTRAVENOUS | Status: AC
  Administered 2016-02-06: 500 mL via INTRAVENOUS

## 2016-02-06 MED ORDER — COSYNTROPIN 0.25 MG IJ SOLR
0.2500 mg | Freq: Once | INTRAMUSCULAR | Status: AC
  Administered 2016-02-06: 0.25 mg via INTRAVENOUS
  Filled 2016-02-06: qty 0.25

## 2016-02-06 MED ORDER — POTASSIUM CHLORIDE CRYS ER 20 MEQ PO TBCR
40.0000 meq | EXTENDED_RELEASE_TABLET | Freq: Once | ORAL | Status: AC
  Administered 2016-02-06: 40 meq via ORAL
  Filled 2016-02-06: qty 2

## 2016-02-06 MED ORDER — MAGNESIUM SULFATE 2 GM/50ML IV SOLN
2.0000 g | Freq: Once | INTRAVENOUS | Status: AC
  Administered 2016-02-06: 2 g via INTRAVENOUS
  Filled 2016-02-06: qty 50

## 2016-02-06 NOTE — Progress Notes (Signed)
Received patient from ED on stretcher. Pt oriented to person and place. Multiple ecchymotic areas on upper and lower extremities. Left knee has significant ecchymotic area with old abrasion present.  Currently, open to air.  Ecchymosis also present on R knee.  Pt has several areas on lower Left back, appearing to have been scratched. Open to air. Dorothe PeaKaren Florena Kozma, RN

## 2016-02-06 NOTE — Evaluation (Signed)
Physical Therapy Evaluation Patient Details Name: Jose Li MRN: [id number] DOB: 02/05/1956 Today's Date: 02/06/2016   History of Present Illness  Patient is a 60 yo male admitted 02/05/16 with worsening confusion.  Patient with hyponatremia.   PMH:  syncope, orthostatic hypotension, multiple falls, mult SDH, anxiety, depression, bipolar disorder, seizures, OSA  Clinical Impression  Patient presents with problems listed below.  Will benefit from acute PT to maximize functional mobility prior to discharge.  Patient was independent pta with mobility.  Currently with deficits related to mobility, ADL's, and cognition.  Recommend Inpatient Rehab consult with goal to maximize functional independence prior to return home with father.    Follow Up Recommendations CIR;Supervision/Assistance - 24 hour    Equipment Recommendations  Rolling walker with 5" wheels    Recommendations for Other Services Rehab consult     Precautions / Restrictions Precautions Precautions: Fall Precaution Comments: Mult falls at home with injury (mult SDH, Rt UE fx, rib fx) Restrictions Weight Bearing Restrictions: No      Mobility  Bed Mobility Overal bed mobility: Needs Assistance Bed Mobility: Supine to Sit;Sit to Supine     Supine to sit: Min guard Sit to supine: Min guard   General bed mobility comments: Verbal cues to move to EOB - mult cues before patient initiated movement.  Patient sat for approx 2 minutes as PT asking questions.  Patient suddenly returned to supine.  Encouraged patient to sit up again.  Impulsive.  Transfers Overall transfer level: Needs assistance Equipment used: None Transfers: Sit to/from Stand Sit to Stand: Mod assist         General transfer comment: Patient rocking and standing up quickly.  Immediately lost balance posteriorly and abruptly sat on bed.  Instructed patient to stop, but patient repeated this activity.  Had patient stop, and then stand using slower  technique.  Stood for several seconds to maintain balance prior to gait.  Ambulation/Gait Ambulation/Gait assistance: Mod assist Ambulation Distance (Feet): 8 Feet Assistive device: None Gait Pattern/deviations: Step-through pattern;Decreased stride length;Shuffle     General Gait Details: Patient with very unsteady gait.  Ambulated short distance and stopped.  Reported he needed to return to bed.  Mod assist to maintain balance.  Stairs            Wheelchair Mobility    Modified Rankin (Stroke Patients Only)       Balance Overall balance assessment: Needs assistance         Standing balance support: No upper extremity supported Standing balance-Leahy Scale: Fair Standing balance comment: Able to maintain static balance.  Unsteady with dynamic activities                             Pertinent Vitals/Pain Pain Assessment: No/denies pain    Home Living Family/patient expects to be discharged to:: Private residence Living Arrangements: Parent (Father) Available Help at Discharge: Family;Available 24 hours/day (Unsure is father is able to assist patient) Type of Home: House       Home Layout: Two level;Bed/bath upstairs Home Equipment: Crutches      Prior Function Level of Independence: Independent         Comments: Takes bus or gets rides to medical appts     Hand Dominance        Extremity/Trunk Assessment   Upper Extremity Assessment: RUE deficits/detail RUE Deficits / Details: Short cast RUE         Lower Extremity  Assessment: Generalized weakness         Communication   Communication: No difficulties  Cognition Arousal/Alertness: Awake/alert Behavior During Therapy: Anxious;Impulsive Overall Cognitive Status: Impaired/Different from baseline Area of Impairment: Orientation;Attention;Safety/judgement;Problem solving;Awareness Orientation Level: Disoriented to;Time;Situation Current Attention Level: Sustained      Safety/Judgement: Decreased awareness of deficits;Decreased awareness of safety   Problem Solving: Slow processing;Difficulty sequencing;Requires verbal cues      General Comments      Exercises        Assessment/Plan    PT Assessment Patient needs continued PT services  PT Diagnosis Difficulty walking;Generalized weakness;Altered mental status   PT Problem List Decreased strength;Decreased activity tolerance;Decreased balance;Decreased mobility;Decreased knowledge of use of DME;Decreased cognition;Decreased safety awareness  PT Treatment Interventions DME instruction;Gait training;Functional mobility training;Therapeutic activities;Balance training;Cognitive remediation;Patient/family education   PT Goals (Current goals can be found in the Care Plan section) Acute Rehab PT Goals Patient Stated Goal: None stated PT Goal Formulation: With patient Time For Goal Achievement: 02/13/16 Potential to Achieve Goals: Good    Frequency Min 3X/week   Barriers to discharge   Unsure whether father can provide care patient requires    Co-evaluation               End of Session Equipment Utilized During Treatment: Gait belt Activity Tolerance: Patient limited by fatigue Patient left: in bed;with call bell/phone within reach;with bed alarm set Nurse Communication: Mobility status         Time: [id number] PT Time Calculation (min) (ACUTE ONLY): 10 min   Charges:   PT Evaluation $PT Eval Moderate Complexity: 1 Procedure     PT G CodesVena Austria:        Dabney Dever H 02/06/2016, 8:30 PM Durenda HurtSusan H. Renaldo Fiddleravis, PT, [facility] Acute Rehab Services Pager [phone]

## 2016-02-06 NOTE — Progress Notes (Signed)
CRITICAL VALUE ALERT  Critical value received:  Sodium 118  Date of notification:  02/06/16  Time of notification:  1:24pm  Critical value read back:Yes.    Nurse who received alert:  Warnell BureauErica Chele Cornell, RN   MD notified (1st page):  [facility]  Time of first page:  1:52pm  MD notified (2nd page):  Time of second page:   Responding MD:  Ottie GlazierGunadasa, MD  Time MD responded:  2:18pm

## 2016-02-06 NOTE — Care Management Note (Signed)
Case Management Note  Patient Details  Name: Jose Li MRN: [id number] Date of Birth: 1956-10-16  Subjective/Objective:                 Patient admitted with electrolyte imbalances and falls. Patient is a recent discharge with c/o chest pain.  Patient lives at home with his father. He states he takes the bus or gets a ride from his dad to get to [facility] apt and to the pharmacy. Pt goes to [facility]. Patient denies having a cane or walker. He states that he has had [facility] in the past and would use hem again if needed. PT eval pending.    Action/Plan:  [facility]  Expected Discharge Date:  02/07/16               Expected Discharge Plan:  Home/Self Care  In-House Referral:     Discharge planning Services  CM Consult  Post Acute Care Choice:    Choice offered to:  Patient  DME Arranged:    DME Agency:     HH Arranged:    HH Agency:  [facility]  Status of Service:  In process, will continue to follow  Medicare Important Message Given:    Date Medicare IM Given:    Medicare IM give by:    Date Additional Medicare IM Given:    Additional Medicare Important Message give by:     If discussed at Long Length of Stay Meetings, dates discussed:    Additional Comments:  Jose SabalDebbie Gurman [place], RN 02/06/2016, 2:17 PM

## 2016-02-06 NOTE — Progress Notes (Cosign Needed)
Family Medicine Teaching Service Daily Progress Note Intern Pager: [phone]  Patient name: Jose Li Medical record number: [id number] Date of birth: 11-09-1956 Age: 60 y.o. Gender: male  Primary Care Provider: Almon Herculesaye T Gonfa, MD Consultants: none Code Status: FULL  Pt Overview and Major Events to Date:  3/23: admit for hypnatremia and confusion  Assessment and Plan: Jose Li is a 60 y.o. male presenting with confusion, hyponatremia. PMH is significant for recent head trauma with multiple small SDH, recent syncopal episodes, orthostatic hypotension, anxiety/depression/bipolar, seizures, OSA.  # Toxic metabolic encephalopathy likely secondary to hyponatremia: sodium 120 on admission, down from 133-134 9 days ago. Vitals stable. Repeat CT head showed resolved SDH, no other pathology. Differential for encephalopathy includes hyponatremia, infection (leukocytosis and borderline fever) but no obvious source, medications (on multiple psych medications). Hyponatremia could be due to SIADH vs cerebral salt wasting vs polydipsia vs medications (of note risperdal could be a cause of SIADH). He received 3 x 500cc NS bolus since presentation. Urine osmolality: 290 (low). Serum osmolality: 247 (low). ACTH stim test is normal. TSH is wnl. Vit B12 473 (wnl). Most recent Sodium at 118.  - BMP q4hrs - monitor with fluid restriction to 1200cc - strict I&O - HIV, RPR, Ammonia  # Leukocytosis and borderline fever: WBC 17.5 > 15.7, up slightly from ED visit where it was 15. Temp 100.38F initially in ED, improved to 99.49F. Not hypotensive. No clear source of infection. - blood cultures pending - if spikes fevers, becomes hypotensive, will start empiric antibiotics - rapid flu  # Syncopal episodes: seen by EP today as outpatient, noted he is on multiple medicines that can contribute to orthostasis. EKG in ED without arrhythmia.  - telemetry -ECHO ordered  # Hypokalemia, improving: 3.1, s/p total 80mEq  Kdur. Most recent K 3.4  - will replete Kdur 20mEq once  - monitor with labs   # Abdominal pain: difficult to elicit full history, sounds to be intermittent. Reported diarrhea yesterday, no BM today. Soft  abdomen but no guarding/rebound. KUB was unremarkable. Lipase wnl at 34. CMP wnl. Abdominal x-ray wnl.  - monitor clinically  # Orthostatic hypotension: on midodrine. BPs in hypertensive range currently - ordered midodrine dosing to continue tomorrow but may need to be held if BP elevated and not ambulatory.  # History of seizures: none recently - continue keppra 1500mg  twice daily  - continue lyrica 75mg  twice daily   # Psych: multiple mentions of anxiety/depression/bipolar in history/Epic chart.  - home medications include cymbalta, risperdal, lyrica - consider psych consult, likely needs medication adjustments in light of syncopal episodes above  FEN/GI: fluids pending repeat labs. Prophylaxis: SCDs  Disposition: pending management of hyponatremia  Subjective:  States he is still confused. When asked about recent medication changes, reports of increase dose of Midrodrine. When asked, notes of   Objective: Temp:  [98.8 F (37.1 C)-100.3 F (37.9 C)] 98.8 F (37.1 C) (03/24 0657) Pulse Rate:  [87-100] 87 (03/24 0657) Resp:  [18-22] 18 (03/24 0657) BP: (150-174)/(72-97) 159/72 mmHg (03/24 0657) SpO2:  [96 %-100 %] 98 % (03/24 0657) Weight:  [90.719 kg (200 lb)-91.1 kg (200 lb 13.4 oz)] 91.1 kg (200 lb 13.4 oz) (03/23 2150) Physical Exam: General: NAD Cardiovascular: RRR, no m/r/g Respiratory: normal effort, CTAB Abdomen: Soft, some tenderness on Right abdomen, no guarding or rebound,  Extremities: no LE edema Neuro: CB 2-12 grossly intact, strength 5/5 in upper and lower extremities, sensation to light touch normal in al extremities  Laboratory:  Recent Labs Lab 02/05/16 1811 02/06/16 0209  WBC 17.5* 15.7*  HGB 11.9* 10.3*  HCT 32.6* 28.6*  PLT 507* 411*     Recent Labs Lab 02/05/16 1811 02/05/16 2340 02/06/16 0209  NA 120* 122* 120*  K 3.0* 3.1* 3.0*  CL 86* 90* 89*  CO2 22 25 21*  BUN <5* <5* <5*  CREATININE 0.90 0.78 0.71  CALCIUM 8.4* 7.8* 7.8*  PROT 6.6  --   --   BILITOT 1.0  --   --   ALKPHOS 99  --   --   ALT 22  --   --   AST 36  --   --   GLUCOSE 104* 109* 95    Imaging/Diagnostic Tests: Abdominal x-ray: FINDINGS: There is no evidence of bowel obstruction for free air. No radiopaque calculi identified. The soft tissues are grossly unremarkable with there is osteopenia with degenerative changes of the spine. Partially visualized right femoral intra medullary hardware. Stop no acute fracture.  IMPRESSION: Negative.  CT Head: FINDINGS: There is trace residual subdural hematoma anterior to the right frontal lobe, decreased from prior. The small volume subdural hematoma along the falx on the prior study has also largely resolved, with at most trace residual blood anteriorly. There is no evidence of acute large territory infarct, mass, or midline shift. Chronic infarcts are again seen in the right basal ganglia and right parietal lobe. There is moderate cerebral atrophy.  The orbits are unremarkable. Minimal residual right parietal scalp soft tissue swelling remains. Paranasal sinuses and mastoid air cells are clear. No skull fracture is identified. Calcified atherosclerosis is noted at the skullbase.  IMPRESSION: 1. Near complete resolution of right-sided subdural hematomas. 2. No evidence of new intracranial abnormality.  CXR:  IMPRESSION: Low lung volumes with vascular crowding and bibasilar atelectasis.  Vascular congestion and possible mild interstitial edema.  Healed and acute left rib fractures again demonstrated.  Palma Holter, MD 02/06/2016, 7:21 AM PGY-1, [facility] FPTS Intern pager: [phone], text pages welcome

## 2016-02-06 NOTE — Progress Notes (Signed)
Nutrition Brief Note  Patient identified on the Malnutrition Screening Tool (MST) Report  Wt Readings from Last 15 Encounters:  02/05/16 200 lb 13.4 oz (91.1 kg)  02/05/16 200 lb 12.8 oz (91.082 kg)  01/05/16 201 lb 12.8 oz (91.536 kg)  12/29/15 202 lb 6.4 oz (91.808 kg)  10/31/15 205 lb 12.8 oz (93.35 kg)  10/30/15 206 lb 6.4 oz (93.622 kg)  10/01/15 209 lb (94.802 kg)  08/07/15 214 lb 6.4 oz (97.251 kg)  06/28/15 220 lb (99.791 kg)  05/29/15 223 lb (101.152 kg)  05/06/15 234 lb (106.142 kg)  03/25/15 224 lb (101.606 kg)  01/31/15 234 lb (106.142 kg)  10/29/14 231 lb (104.781 kg)  08/02/14 229 lb 6.4 oz (104.055 kg)   Jose Li is a 60 y.o. male presenting with confusion, hyponatremia. PMH is significant for recent head trauma with multiple small SDH, recent syncopal episodes, orthostatic hypotension, anxiety/depression/bipolar, seizures, OSA.  Pt in with multiple providers at time of visit.   Wt hx reviewed. Pt UBW is around 200#, however, fluctuates between 195-212# at baseline. Noted pt has had a period of weight gain, but has experienced steady weight loss in the past year, with no weight changes significant for time frame. Wt loss is favorable given hx of obesity.   No signs of subcutaneous fat or muscle depletion observed.   Body mass index is 27.23 kg/(m^2). Patient meets criteria for overweight based on current BMI.   Current diet order is regular, patient is consuming approximately n/a% of meals at this time. Labs and medications reviewed.   No nutrition interventions warranted at this time. If nutrition issues arise, please consult RD.   Berda Shelvin A. Mayford KnifeWilliams, RD, LDN, CDE Pager: [phone] After hours Pager: [phone]

## 2016-02-06 NOTE — Progress Notes (Signed)
FPTS Interim Progress Note  S: Went to reevaluate patient. Patient's father was at bedside. Father states that patient usually eats normally at home. Patient states he eats [employer] at home. He does report that his cardiologist have encouraged him to increase salt in his diet. He did not eat breakfast because he forgot his dentures at home.   Patient's father reports that patient has had falls since the 5370's and it seems to have increased in frequency as he got older. Patient states he usually has falls about 1x/week.  His falls have significantly increased since his fall on 3/12 and patient has been confused since his fall as well.   O: BP 153/79 mmHg  Pulse 78  Temp(Src) 98.8 F (37.1 C) (Oral)  Resp 18  Ht 6' (1.829 m)  Wt 91.1 kg (200 lb 13.4 oz)  BMI 27.23 kg/m2  SpO2 99%    A/P: Currently hyponatremia is further decreasing.  - placed a soft diet so patient is able to eat without dentures - encouraged patient to eat to help improve Na levels - will continue to monitor with q4 BMPs   Palma HolterKanishka G Gunadasa, MD 02/06/2016, 3:18 PM PGY-1, [facility] Service pager [phone]

## 2016-02-06 NOTE — Progress Notes (Signed)
CRITICAL VALUE ALERT  Critical value received:  Serum Osmolarity 247  Date of notification:  02/06/2016  Time of notification:  0031  Critical value read back:Yes.    Nurse who received alert:  Raynald BlendAsia Terrell Shimko, RN  MD notified (1st page):  Oncall MD  Time of first page: 0035  Responding MD:  Sampson GoonFitzgerald, MD  Time MD responded:  Wendell.[email or web address], MD Awaiting urine sample.

## 2016-02-07 LAB — BASIC METABOLIC PANEL
Anion gap: 7 (ref 5–15)
Anion gap: 10 (ref 5–15)
Anion gap: 9 (ref 5–15)
Anion gap: 8 (ref 5–15)
Anion gap: 9 (ref 5–15)
BUN: <5 mg/dL — ABNORMAL LOW (ref 6–20)
CALCIUM: 8.3 mg/dL — AB (ref 8.9–10.3)
CALCIUM: 8.4 mg/dL — AB (ref 8.9–10.3)
CALCIUM: 8.3 mg/dL — AB (ref 8.9–10.3)
CALCIUM: 8.6 mg/dL — AB (ref 8.9–10.3)
CALCIUM: 8.5 mg/dL — AB (ref 8.9–10.3)
CHLORIDE: 97 mmol/L — AB (ref 101–111)
CO2: 21 mmol/L — AB (ref 22–32)
CO2: 19 mmol/L — ABNORMAL LOW (ref 22–32)
CO2: 22 mmol/L (ref 22–32)
CO2: 23 mmol/L (ref 22–32)
CO2: 20 mmol/L — AB (ref 22–32)
CREATININE: 0.69 mg/dL (ref 0.61–1.24)
CREATININE: 0.76 mg/dL (ref 0.61–1.24)
CREATININE: 0.75 mg/dL (ref 0.61–1.24)
CREATININE: 0.81 mg/dL (ref 0.61–1.24)
CREATININE: 0.76 mg/dL (ref 0.61–1.24)
Chloride: 95 mmol/L — ABNORMAL LOW (ref 101–111)
Chloride: 96 mmol/L — ABNORMAL LOW (ref 101–111)
Chloride: 93 mmol/L — ABNORMAL LOW (ref 101–111)
Chloride: 96 mmol/L — ABNORMAL LOW (ref 101–111)
GFR calc Af Amer: >60 mL/min (ref >60)
GFR calc Af Amer: >60 mL/min (ref >60)
GFR calc non Af Amer: >60 mL/min (ref >60)
GFR calc non Af Amer: >60 mL/min (ref >60)
GFR calc non Af Amer: >60 mL/min (ref >60)
GFR calc non Af Amer: >60 mL/min (ref >60)
GLUCOSE: 103 mg/dL — AB (ref 65–99)
Glucose, Bld: 101 mg/dL — ABNORMAL HIGH (ref 65–99)
Glucose, Bld: 114 mg/dL — ABNORMAL HIGH (ref 65–99)
Glucose, Bld: 126 mg/dL — ABNORMAL HIGH (ref 65–99)
Glucose, Bld: 133 mg/dL — ABNORMAL HIGH (ref 65–99)
Potassium: 3.5 mmol/L (ref 3.5–5.1)
Potassium: 4.2 mmol/L (ref 3.5–5.1)
Potassium: 3.8 mmol/L (ref 3.5–5.1)
Potassium: 3.6 mmol/L (ref 3.5–5.1)
Potassium: 3.3 mmol/L — ABNORMAL LOW (ref 3.5–5.1)
SODIUM: 124 mmol/L — AB (ref 135–145)
SODIUM: 127 mmol/L — AB (ref 135–145)
SODIUM: 126 mmol/L — AB (ref 135–145)
Sodium: 123 mmol/L — ABNORMAL LOW (ref 135–145)
Sodium: 125 mmol/L — ABNORMAL LOW (ref 135–145)

## 2016-02-07 LAB — MAGNESIUM
MAGNESIUM: 1.6 mg/dL — AB (ref 1.7–2.4)
Magnesium: 1.7 mg/dL (ref 1.7–2.4)

## 2016-02-07 LAB — HIV ANTIBODY (ROUTINE TESTING W REFLEX): HIV Screen 4th Generation wRfx: NONREACTIVE

## 2016-02-07 LAB — CBC
HCT: 31.3 % — ABNORMAL LOW (ref 39.0–52.0)
Hemoglobin: 11.3 g/dL — ABNORMAL LOW (ref 13.0–17.0)
MCH: 28 pg (ref 26.0–34.0)
MCHC: 36.1 g/dL — ABNORMAL HIGH (ref 30.0–36.0)
MCV: 77.7 fL — ABNORMAL LOW (ref 78.0–100.0)
PLATELETS: 431 10*3/uL — AB (ref 150–400)
RBC: 4.03 MIL/uL — AB (ref 4.22–5.81)
RDW: 12.7 % (ref 11.5–15.5)
WBC: 14.2 10*3/uL — AB (ref 4.0–10.5)

## 2016-02-07 LAB — PHOSPHORUS: Phosphorus: 2.5 mg/dL (ref 2.5–4.6)

## 2016-02-07 LAB — RPR: RPR Ser Ql: NONREACTIVE

## 2016-02-07 MED ORDER — POTASSIUM CHLORIDE CRYS ER 20 MEQ PO TBCR
40.0000 meq | EXTENDED_RELEASE_TABLET | Freq: Two times a day (BID) | ORAL | Status: AC
  Administered 2016-02-07 – 2016-02-08 (×2): 40 meq via ORAL
  Filled 2016-02-07 (×2): qty 2

## 2016-02-07 NOTE — Progress Notes (Signed)
Inpatient Rehabilitation  Patient was screened by Fae PippinMelissa Azhia Siefken for appropriateness for an Inpatient Acute Rehab consult.  At this time we are recommending an Inpatient Rehab consult.  Family Medicine Teaching Service Intern paged; please order if you are agreeable.    Charlane FerrettiMelissa Kirby Cortese, M.A., CCC/SLP Admission Coordinator  [facility]  Cell [phone]

## 2016-02-07 NOTE — Progress Notes (Signed)
Family Medicine Teaching Service Daily Progress Note Intern Pager: [phone]  Patient name: Jose Li Medical record number: [id number] Date of birth: [birth year] Age: 60 y.o. Gender: male  Primary Care Provider: Almon Hercules, MD Consultants: none Code Status: FULL  Pt Overview and Major Events to Date:  3/23: admit for hypnatremia and confusion  Assessment and Plan: 60 y.o. male presenting with confusion, hyponatremia. PMH is significant for recent head trauma with multiple small SDH, recent syncopal episodes, orthostatic hypotension, anxiety/depression/bipolar, seizures, OSA.  # Toxic metabolic encephalopathy likely secondary to hyponatremia: sodium 120 on admission, down from 133-134 9 days ago. Repeat CT head showed resolved SDH, no other pathology. Urine osmolality: 290 (low). Serum osmolality: 247 (low). ACTH stim test is normal. TSH is wnl. Vit B12 473 (wnl).  - Most recent sodium 125 (122>118>120>122>121>123>125) - BMP q4hrs - Monitor with fluid restriction to 1200cc - Strict I&O - PT/OT recommend CIR. CIR evaluation ordered.  # Leukocytosis: -  WBC 17.5 > 15.7 >14.2.  - Afebrile - Blood cultures negative to date - Monitor on CBC  # Syncopal episodes: seen by EP today as outpatient, noted he is on multiple medicines that can contribute to orthostasis. EKG in ED without arrhythmia. On Midodrine for h/o orthostatic hypotension. - Telemetry - ECHO ordered - Hold Midodrine if BP elevated  #Anxiety/Depression/Bipolar: - Cymbalta discontinued as suspected to be contributing to Hyponatremia - Continue Risperdal, Trazodone, Lyrica - Contact Psychiatry concerning medication at discharge.  FEN/GI: fluids pending repeat labs. Prophylaxis: SCDs  Disposition: pending management of hyponatremia  Subjective:  Feeling better. Still slightly confused, but closer to baseline. Denies pain.  Objective: Temp:  [98.7 F (37.1 C)-99.2 F (37.3 C)] 98.7 F (37.1 C) (03/25  0512) Pulse Rate:  [78-82] 79 (03/25 0512) Resp:  [18-22] 22 (03/25 0512) BP: (152-192)/(66-82) 152/66 mmHg (03/25 0512) SpO2:  [99 %] 99 % (03/25 0512) Physical Exam: General: NAD Cardiovascular: RRR, no m/r/g Respiratory: normal effort, CTAB Abdomen: Soft, nondistended, nontender Extremities: no LE edema Psych: AAOx3  Laboratory:  Recent Labs Lab 02/05/16 1811 02/06/16 0209 02/07/16 0306  WBC 17.5* 15.7* 14.2*  HGB 11.9* 10.3* 11.3*  HCT 32.6* 28.6* 31.3*  PLT 507* 411* 431*    Recent Labs Lab 02/05/16 1811  02/06/16 2236 02/07/16 0306 02/07/16 0631  NA 120*  < > 121* 123* 125*  K 3.0*  < > 3.3* 3.5 4.2  CL 86*  < > 93* 95* 96*  CO2 22  < > 19* 21* 19*  BUN <5*  < > <5* <5* <5*  CREATININE 0.90  < > 0.66 0.69 0.76  CALCIUM 8.4*  < > 8.2* 8.3* 8.4*  PROT 6.6  --   --   --   --   BILITOT 1.0  --   --   --   --   ALKPHOS 99  --   --   --   --   ALT 22  --   --   --   --   AST 36  --   --   --   --   GLUCOSE 104*  < > 105* 103* 101*  < > = values in this interval not displayed. - Troponin <0.03 - Osmolality 247, Urine Osmolality 290 - Urine sodium 80 - Urine creatinine 75.31 - Vitamin B12 473 - Cortisol normal - TSH 1.518 - RPR nonreactive - Flu negative - HIV nonreactive - UDS negative   Imaging/Diagnostic Tests: Abdominal x-ray: FINDINGS: There is no  evidence of bowel obstruction for free air. No radiopaque calculi identified. The soft tissues are grossly unremarkable with there is osteopenia with degenerative changes of the spine. Partially visualized right femoral intra medullary hardware. Stop no acute fracture.  IMPRESSION: Negative.  CT Head: FINDINGS: There is trace residual subdural hematoma anterior to the right frontal lobe, decreased from prior. The small volume subdural hematoma along the falx on the prior study has also largely resolved, with at most trace residual blood anteriorly. There is no evidence of acute large territory  infarct, mass, or midline shift. Chronic infarcts are again seen in the right basal ganglia and right parietal lobe. There is moderate cerebral atrophy.  The orbits are unremarkable. Minimal residual right parietal scalp soft tissue swelling remains. Paranasal sinuses and mastoid air cells are clear. No skull fracture is identified. Calcified atherosclerosis is noted at the skullbase.  IMPRESSION: 1. Near complete resolution of right-sided subdural hematomas. 2. No evidence of new intracranial abnormality.  CXR:  IMPRESSION: Low lung volumes with vascular crowding and bibasilar atelectasis.  Vascular congestion and possible mild interstitial edema.  Healed and acute left rib fractures again demonstrated.  [address] GilbertRumley, [place]DO 02/07/2016, 9:01 AM PGY-2, [facility] FPTS Intern pager: [phone], text pages welcome

## 2016-02-08 ENCOUNTER — Inpatient Hospital Stay (HOSPITAL_COMMUNITY): Payer: Medicare Other

## 2016-02-08 DIAGNOSIS — R55 Syncope and collapse: Secondary | ICD-10-CM

## 2016-02-08 LAB — BASIC METABOLIC PANEL
ANION GAP: 8 (ref 5–15)
Anion gap: 8 (ref 5–15)
Anion gap: 8 (ref 5–15)
BUN: <5 mg/dL — ABNORMAL LOW (ref 6–20)
CALCIUM: 8.4 mg/dL — AB (ref 8.9–10.3)
CALCIUM: 8.7 mg/dL — AB (ref 8.9–10.3)
CHLORIDE: 103 mmol/L (ref 101–111)
CO2: 21 mmol/L — AB (ref 22–32)
CO2: 22 mmol/L (ref 22–32)
CO2: 21 mmol/L — AB (ref 22–32)
CREATININE: 0.78 mg/dL (ref 0.61–1.24)
CREATININE: 0.77 mg/dL (ref 0.61–1.24)
Calcium: 9 mg/dL (ref 8.9–10.3)
Chloride: 99 mmol/L — ABNORMAL LOW (ref 101–111)
Chloride: 100 mmol/L — ABNORMAL LOW (ref 101–111)
Creatinine, Ser: 0.81 mg/dL (ref 0.61–1.24)
GFR calc Af Amer: >60 mL/min (ref >60)
GFR calc Af Amer: >60 mL/min (ref >60)
GFR calc non Af Amer: >60 mL/min (ref >60)
GFR calc non Af Amer: >60 mL/min (ref >60)
GLUCOSE: 111 mg/dL — AB (ref 65–99)
GLUCOSE: 121 mg/dL — AB (ref 65–99)
GLUCOSE: 162 mg/dL — AB (ref 65–99)
POTASSIUM: 3.8 mmol/L (ref 3.5–5.1)
Potassium: 3.7 mmol/L (ref 3.5–5.1)
Potassium: 4.5 mmol/L (ref 3.5–5.1)
Sodium: 128 mmol/L — ABNORMAL LOW (ref 135–145)
Sodium: 130 mmol/L — ABNORMAL LOW (ref 135–145)
Sodium: 132 mmol/L — ABNORMAL LOW (ref 135–145)

## 2016-02-08 LAB — ECHOCARDIOGRAM COMPLETE
HEIGHTINCHES: 72 in
WEIGHTICAEL: 3128.77 [oz_av]

## 2016-02-08 NOTE — Progress Notes (Signed)
Family Medicine Teaching Service Daily Progress Note Intern Pager: [phone]  Patient name: Jose Li Medical record number: [id number] Date of birth: 02-10-1956 Age: 60 y.o. Gender: male  Primary Care Provider: Almon Hercules, MD Consultants: none Code Status: FULL  Pt Overview and Major Events to Date:  3/23: admit for hypnatremia and confusion  Assessment and Plan: 60 y.o. male presenting with confusion, hyponatremia. PMH is significant for recent head trauma with multiple small SDH, recent syncopal episodes, orthostatic hypotension, anxiety/depression/bipolar, seizures, OSA.  # Toxic metabolic encephalopathy likely secondary to hyponatremia: sodium 120 on admission.. Repeat CT head showed resolved SDH, no other pathology. Urine osmolality: 290 (low). Serum osmolality: 247 (low). ACTH stim test is normal. TSH is wnl. Vit B12 473 (wnl).  - Most recent sodium 128 (122>118>120>122>121>123>125>124>127>126>128) - BMP q8hrs - Monitor with fluid restriction to 1200cc - Strict I&O - PT/OT recommend CIR. CIR evaluation ordered.  # Syncopal episodes: seen by EP today as outpatient, noted he is on multiple medicines that can contribute to orthostasis. EKG in ED without arrhythmia. On Midodrine for h/o orthostatic hypotension. - Telemetry - ECHO ordered - Hold Midodrine if BP elevated  #Anxiety/Depression/Bipolar: - Cymbalta discontinued as suspected to be contributing to Hyponatremia - Continue Risperdal, Trazodone, Lyrica - Contact Outpatient Psychiatry concerning medication at discharge. Will do 3/27  FEN/GI: fluids pending repeat labs. Prophylaxis: SCDs  Disposition: pending management of hyponatremia  Subjective:  Notes improvement in confusion. Denies pain. No further concerns today. Agreeable to CIR.   Objective: Temp:  [97.7 F (36.5 C)-99.3 F (37.4 C)] 99.3 F (37.4 C) (03/26 0510) Pulse Rate:  [82-96] 96 (03/26 0510) Resp:  [18-20] 18 (03/26 0510) BP:  (149-187)/(65-87) 149/65 mmHg (03/26 0510) SpO2:  [90 %-100 %] 99 % (03/26 0510) Weight:  [195 lb 8.8 oz (88.7 kg)] 195 lb 8.8 oz (88.7 kg) (03/26 0508) Physical Exam: General: NAD Cardiovascular: RRR, no m/r/g Respiratory: normal effort, CTAB Abdomen: Soft, nondistended, nontender Extremities: no LE edema Psych: AAOx3  Laboratory:  Recent Labs Lab 02/05/16 1811 02/06/16 0209 02/07/16 0306  WBC 17.5* 15.7* 14.2*  HGB 11.9* 10.3* 11.3*  HCT 32.6* 28.6* 31.3*  PLT 507* 411* 431*    Recent Labs Lab 02/05/16 1811  02/07/16 1453 02/07/16 1827 02/08/16 0317  NA 120*  < > 127* 126* 128*  K 3.0*  < > 3.6 3.3* 3.7  CL 86*  < > 96* 97* 99*  CO2 22  < > 23 20* 21*  BUN <5*  < > <5* <5* <5*  CREATININE 0.90  < > 0.81 0.76 0.78  CALCIUM 8.4*  < > 8.6* 8.5* 8.4*  PROT 6.6  --   --   --   --   BILITOT 1.0  --   --   --   --   ALKPHOS 99  --   --   --   --   ALT 22  --   --   --   --   AST 36  --   --   --   --   GLUCOSE 104*  < > 126* 133* 111*  < > = values in this interval not displayed. - Troponin <0.03 - Osmolality 247, Urine Osmolality 290 - Urine sodium 80 - Urine creatinine 75.31 - Vitamin B12 473 - Cortisol normal - TSH 1.518 - RPR nonreactive - Flu negative - HIV nonreactive - UDS negative   Imaging/Diagnostic Tests: Abdominal x-ray: FINDINGS: There is no evidence of bowel obstruction for  free air. No radiopaque calculi identified. The soft tissues are grossly unremarkable with there is osteopenia with degenerative changes of the spine. Partially visualized right femoral intra medullary hardware. Stop no acute fracture.  IMPRESSION: Negative.  CT Head: FINDINGS: There is trace residual subdural hematoma anterior to the right frontal lobe, decreased from prior. The small volume subdural hematoma along the falx on the prior study has also largely resolved, with at most trace residual blood anteriorly. There is no evidence of acute large territory  infarct, mass, or midline shift. Chronic infarcts are again seen in the right basal ganglia and right parietal lobe. There is moderate cerebral atrophy.  The orbits are unremarkable. Minimal residual right parietal scalp soft tissue swelling remains. Paranasal sinuses and mastoid air cells are clear. No skull fracture is identified. Calcified atherosclerosis is noted at the skullbase.  IMPRESSION: 1. Near complete resolution of right-sided subdural hematomas. 2. No evidence of new intracranial abnormality.  CXR:  IMPRESSION: Low lung volumes with vascular crowding and bibasilar atelectasis.  Vascular congestion and possible mild interstitial edema.  Healed and acute left rib fractures again demonstrated.  [address] PapineauRumley, DO 02/08/2016, 7:16 AM PGY-2, [facility] FPTS Intern pager: [phone], text pages welcome

## 2016-02-08 NOTE — Progress Notes (Signed)
  Echocardiogram 2D Echocardiogram has been performed.  Delcie RochENNINGTON, Avereigh Spainhower 02/08/2016, 5:35 PM

## 2016-02-09 ENCOUNTER — Other Ambulatory Visit (HOSPITAL_COMMUNITY): Payer: Medicare Other

## 2016-02-09 DIAGNOSIS — S069X9S Unspecified intracranial injury with loss of consciousness of unspecified duration, sequela: Secondary | ICD-10-CM

## 2016-02-09 DIAGNOSIS — S069XAA Unspecified intracranial injury with loss of consciousness status unknown, initial encounter: Secondary | ICD-10-CM | POA: Insufficient documentation

## 2016-02-09 DIAGNOSIS — G4733 Obstructive sleep apnea (adult) (pediatric): Secondary | ICD-10-CM

## 2016-02-09 DIAGNOSIS — I951 Orthostatic hypotension: Secondary | ICD-10-CM

## 2016-02-09 DIAGNOSIS — R109 Unspecified abdominal pain: Secondary | ICD-10-CM

## 2016-02-09 DIAGNOSIS — S069XAS Unspecified intracranial injury with loss of consciousness status unknown, sequela: Secondary | ICD-10-CM | POA: Insufficient documentation

## 2016-02-09 DIAGNOSIS — F3170 Bipolar disorder, currently in remission, most recent episode unspecified: Secondary | ICD-10-CM | POA: Insufficient documentation

## 2016-02-09 DIAGNOSIS — S52501S Unspecified fracture of the lower end of right radius, sequela: Secondary | ICD-10-CM

## 2016-02-09 DIAGNOSIS — S52509A Unspecified fracture of the lower end of unspecified radius, initial encounter for closed fracture: Secondary | ICD-10-CM | POA: Insufficient documentation

## 2016-02-09 DIAGNOSIS — S069X9A Unspecified intracranial injury with loss of consciousness of unspecified duration, initial encounter: Secondary | ICD-10-CM | POA: Insufficient documentation

## 2016-02-09 DIAGNOSIS — S069X0D Unspecified intracranial injury without loss of consciousness, subsequent encounter: Secondary | ICD-10-CM

## 2016-02-09 DIAGNOSIS — Z9181 History of falling: Secondary | ICD-10-CM | POA: Insufficient documentation

## 2016-02-09 DIAGNOSIS — I10 Essential (primary) hypertension: Secondary | ICD-10-CM

## 2016-02-09 LAB — BASIC METABOLIC PANEL
Anion gap: 10 (ref 5–15)
Anion gap: 10 (ref 5–15)
CHLORIDE: 101 mmol/L (ref 101–111)
CHLORIDE: 100 mmol/L — AB (ref 101–111)
CO2: 21 mmol/L — AB (ref 22–32)
CO2: 20 mmol/L — AB (ref 22–32)
CREATININE: 0.74 mg/dL (ref 0.61–1.24)
CREATININE: 0.71 mg/dL (ref 0.61–1.24)
Calcium: 8.7 mg/dL — ABNORMAL LOW (ref 8.9–10.3)
Calcium: 8.5 mg/dL — ABNORMAL LOW (ref 8.9–10.3)
GFR calc Af Amer: >60 mL/min (ref >60)
GFR calc Af Amer: >60 mL/min (ref >60)
GFR calc non Af Amer: >60 mL/min (ref >60)
GFR calc non Af Amer: >60 mL/min (ref >60)
GLUCOSE: 121 mg/dL — AB (ref 65–99)
GLUCOSE: 101 mg/dL — AB (ref 65–99)
Potassium: 3.6 mmol/L (ref 3.5–5.1)
Potassium: 3.8 mmol/L (ref 3.5–5.1)
Sodium: 132 mmol/L — ABNORMAL LOW (ref 135–145)
Sodium: 130 mmol/L — ABNORMAL LOW (ref 135–145)

## 2016-02-09 MED ORDER — POTASSIUM CHLORIDE CRYS ER 20 MEQ PO TBCR: 20.0000 meq | EXTENDED_RELEASE_TABLET | Freq: Every day | ORAL | Status: DC

## 2016-02-09 NOTE — Care Management Important Message (Signed)
Important Message  Patient Details  Name: Jose Li MRN: [id number] Date of Birth: 10/04/1956   Medicare Important Message Given:  Yes    Bernadette HoitShoffner, Roisin Mones Coleman 02/09/2016, 1:03 PM

## 2016-02-09 NOTE — Discharge Instructions (Signed)
You were hospitalized because your sodium level was low causing you to be confused. We believe this was due to your Cymbalta. We have discontinued this medication because of this. Please call your psychiatrist to let them know about this, and please make a follow up appointment with them soon.

## 2016-02-09 NOTE — Progress Notes (Signed)
I met with pt and his Dad at bedside to discuss his progress with therapies. He is hopeful that he can d/c directly home. I will follow his progress over the next day or two to assist with plans. [phone]

## 2016-02-09 NOTE — Progress Notes (Signed)
Family Medicine Teaching Service Daily Progress Note Intern Pager: [phone]  Patient name: Jose Li Medical record number: [id number] Date of birth: Jul 05, 1956 Age: 60 y.o. Gender: male  Primary Care Provider: Almon Hercules, MD Consultants: none Code Status: FULL  Pt Overview and Major Events to Date:  3/23: admit for hyponatremia and confusion  Assessment and Plan: 60 y.o. male presenting with confusion, hyponatremia. PMH is significant for recent head trauma with multiple small SDH, recent syncopal episodes, orthostatic hypotension, anxiety/depression/bipolar, seizures, OSA.  # Toxic metabolic encephalopathy likely secondary to hyponatremia, Improved: sodium 120 on admission.. Repeat CT head showed resolved SDH, no other pathology. Urine osmolality: 290 (low). Serum osmolality: 247 (low). ACTH stim test is normal. TSH is wnl. Vit B12 473 (wnl).  - Most recent sodium 132 (122>>132) - BMP q8hrs - Monitor with fluid restriction to 1200cc - Strict I&O - PT/OT recommend CIR. Patient declined CIR   # Syncopal episodes: seen by EP on day of admission as outpatient, noted he is on multiple medicines that can contribute to orthostasis. EKG in ED without arrhythmia. On Midodrine for h/o orthostatic hypotension. - Telemetry - ECHO: 60-65%, G1DD - Midrodrine  TID  #Anxiety/Depression/Bipolar: - Cymbalta discontinued as suspected to be contributing to Hyponatremia - Continue Risperdal, Trazodone, Lyrica - Contact Outpatient Psychiatry concerning medication at discharge. Will do 3/27  FEN/GI: fluid restricted  Prophylaxis: SCDs  Disposition: pending management of hyponatremia  Subjective:  Notes improvement in confusion. Denies pain. No further concerns today. Would like to go home with home health.   Objective: Temp:  [98.5 F (36.9 C)-98.9 F (37.2 C)] 98.5 F (36.9 C) (03/27 0352) Pulse Rate:  [96-102] 96 (03/27 0352) Resp:  [16] 16 (03/27 0352) BP: (125-154)/(54-74)  125/54 mmHg (03/27 0352) SpO2:  [96 %-98 %] 98 % (03/27 0352) Physical Exam: General: NAD Cardiovascular: RRR, no m/r/g Respiratory: normal effort, CTAB Abdomen: Soft, nondistended, nontender Extremities: no LE edema Psych: AAOx3  Laboratory:  Recent Labs Lab 02/05/16 1811 02/06/16 0209 02/07/16 0306  WBC 17.5* 15.7* 14.2*  HGB 11.9* 10.3* 11.3*  HCT 32.6* 28.6* 31.3*  PLT 507* 411* 431*    Recent Labs Lab 02/05/16 1811  02/08/16 1111 02/08/16 1838 02/09/16 0324  NA 120*  < > 130* 132* 132*  K 3.0*  < > 4.5 3.8 3.6  CL 86*  < > 100* 103 101  CO2 22  < > 22 21* 21*  BUN <5*  < > <5* <5* <5*  CREATININE 0.90  < > 0.77 0.81 0.74  CALCIUM 8.4*  < > 8.7* 9.0 8.7*  PROT 6.6  --   --   --   --   BILITOT 1.0  --   --   --   --   ALKPHOS 99  --   --   --   --   ALT 22  --   --   --   --   AST 36  --   --   --   --   GLUCOSE 104*  < > 121* 162* 121*  < > = values in this interval not displayed. - Troponin <0.03 - Osmolality 247, Urine Osmolality 290 - Urine sodium 80 - Urine creatinine 75.31 - Vitamin B12 473 - Cortisol normal - TSH 1.518 - RPR nonreactive - Flu negative - HIV nonreactive - UDS negative - Magnesium: 1.2 > 1.6 > 1.7   Imaging/Diagnostic Tests: Abdominal x-ray: FINDINGS: There is no evidence of bowel obstruction for free  air. No radiopaque calculi identified. The soft tissues are grossly unremarkable with there is osteopenia with degenerative changes of the spine. Partially visualized right femoral intra medullary hardware. Stop no acute fracture.  IMPRESSION: Negative.  CT Head: FINDINGS: There is trace residual subdural hematoma anterior to the right frontal lobe, decreased from prior. The small volume subdural hematoma along the falx on the prior study has also largely resolved, with at most trace residual blood anteriorly. There is no evidence of acute large territory infarct, mass, or midline shift. Chronic infarcts are again seen  in the right basal ganglia and right parietal lobe. There is moderate cerebral atrophy.  The orbits are unremarkable. Minimal residual right parietal scalp soft tissue swelling remains. Paranasal sinuses and mastoid air cells are clear. No skull fracture is identified. Calcified atherosclerosis is noted at the skullbase.  IMPRESSION: 1. Near complete resolution of right-sided subdural hematomas. 2. No evidence of new intracranial abnormality.  CXR:  IMPRESSION: Low lung volumes with vascular crowding and bibasilar atelectasis.  Vascular congestion and possible mild interstitial edema.  Healed and acute left rib fractures again demonstrated.  Palma HolterKanishka G Latroy Gaymon, MD 02/09/2016, 8:27 AM PGY-1, [place] [facility] FPTS Intern pager: [phone], text pages welcome

## 2016-02-09 NOTE — Discharge Summary (Signed)
Family Medicine Teaching [facility] Discharge Summary  Patient name: Jose Li Medical record number: [id number] Date of birth: [birth year] Age: 60 y.o. Gender: male Date of Admission: 02/05/2016  Date of Discharge: 02/09/16 Admitting Physician: Doreene ElandKehinde T Eniola, MD  Primary Care Provider: Almon Herculesaye T Gonfa, MD Consultants: none  Indication for Hospitalization: AMS and hyponatremia   Discharge Diagnoses/Problem List:  Hyponatremia likely 2/2 increased dose of Cymbalta Toxic Metabolic Encephalopathy  Anxiety/Depression/Bipolar History of Syncopal Episodes   Disposition: home with home health PT  Discharge Condition: improved, stable   Discharge Exam: Please refer to progress note from day of discharge   Brief Hospital Course:  Jose HoopsGary D Li is a 60 y.o. male presenting with confusion, falls. PMH is significant for recent head trauma with multiple small SDH, recent syncopal episodes, orthostatic hypotension, anxiety/depression/bipolar, seizures, OSA. History was provided by patient and his father at the bedside. Father reports that over past month patient has been falling and passing out, on 3/12 passed out on the stairs; presented to ED on 3/14 because of this and found to have several small right sided subdural hematomas; discharged from ED. On arriving home he continued to have several syncopal episodes, including 3 the day after the ED visit. Father and patient both started noticing confusion, worse over past several days. Patient does not specifically add any other symptoms. When asked he says that he has noticed some weakness of his upper extremities, he has pain in his abdomen that he describes as around belly button, comes and goes (when worse it is very bad and lasts a few minutes), and also felt fevers over past 24 hours but no temperature measured. He says he has not felt more thirsty, did not drink a large amount of water over past several days but did drink some water (denies >64oz  in last 24 hours). He had been urinating like normal.  He denies cough, runny nose, muscle aches, burning with urination, new rash. He does have a cut on the back of his head from the fall on 3/12 but this has been kept clean and has not drained any pus or gotten swollen/red. He denies any recent alcohol or drug use. He was found to be hyponatremic in the ED down to 120 (was 133-134 9 days prior to presentation).   Hyponatremia:  This was thought to be due to a recent increase in dose of Cymbalta. Cymbalta was discontinued. Patient was fluid restricted. ACTH stim test was normal.  Labs were closely monitored and sodium improved slowly. Additionally, magnesium was slightly low which was repleted. Other labs are noted below   Encephalopathy: Thought to be due to hyponatremia. Improved and returned to baseline with improvement of hyponatremia. TSH was normal. Vitamin B12 normal. UDS was negative. CT head without new intracranial abnormality.   Leukocytosis: Patient did have leukocytosis on admission and had low grade fever of 100.87F. No sings or symptoms of infection. Patient was afebrile during rest of  hospitalization. Blood cultures showed no growth. Therefore, antibiotics were not started. Influenza panel was negative.  History of Syncope: Patient is followed by cardiology as outpatient and is taking Midrodine; syncopal episodes since the "1670s" per father. ECHO showed EF 60-65% and G1DD. Patient was continued on home midrodrine  Anxiety/Depression/Bipolar: As noted above Cymbalta was discontinued due to suspicion of it contributing to hyponatremia. Risperdal, Trazodone, Lyrica were continued. Patient was asked to make appointment with psychiatry as soon as possible.   Other:  Patient was evaluated by PT  who recommended CIR. Patient declined and opted to go home with [facility] PT. Patient was also ordered a rolling walker.   Issues for Follow Up:  - consider repeat BMP to evaluate sodium  - follow up  if patient has psychiatry appointment - follow up syncope    Significant Procedures: none  Significant Labs and Imaging:   Recent Labs Lab 02/05/16 1811 02/06/16 0209 02/07/16 0306  WBC 17.5* 15.7* 14.2*  HGB 11.9* 10.3* 11.3*  HCT 32.6* 28.6* 31.3*  PLT 507* 411* 431*    Recent Labs Lab 02/05/16 1811 02/05/16 2016  02/07/16 0306  02/07/16 2253 02/08/16 0317 02/08/16 1111 02/08/16 1838 02/09/16 0324 02/09/16 1127  NA 120*  --   < > 123*  < >  --  128* 130* 132* 132* 130*  K 3.0*  --   < > 3.5  < >  --  3.7 4.5 3.8 3.6 3.8  CL 86*  --   < > 95*  < >  --  99* 100* 103 101 100*  CO2 22  --   < > 21*  < >  --  21* 22 21* 21* 20*  GLUCOSE 104*  --   < > 103*  < >  --  111* 121* 162* 121* 101*  BUN <5*  --   < > <5*  < >  --  <5* <5* <5* <5* <5*  CREATININE 0.90  --   < > 0.69  < >  --  0.78 0.77 0.81 0.74 0.71  CALCIUM 8.4*  --   < > 8.3*  < >  --  8.4* 8.7* 9.0 8.7* 8.5*  MG  --  1.2*  --  1.6*  --  1.7  --   --   --   --   --   PHOS  --   --   --  2.5  --   --   --   --   --   --   --   ALKPHOS 99  --   --   --   --   --   --   --   --   --   --   AST 36  --   --   --   --   --   --   --   --   --   --   ALT 22  --   --   --   --   --   --   --   --   --   --   ALBUMIN 3.4*  --   --   --   --   --   --   --   --   --   --   < > = values in this interval not displayed. - Troponin <0.03 - Osmolality 247, Urine Osmolality 290 - Urine sodium 80 - Urine creatinine 75.31 - Vitamin B12 473 - Cortisol 20.9 - TSH 1.518 - RPR nonreactive - Flu negative - HIV nonreactive - UDS negative - Magnesium: 1.2 > 1.6 > 1.7 - ACTH stim test wnl Blood Culture: no growth x 5 days  Abdominal x-ray: FINDINGS: There is no evidence of bowel obstruction for free air. No radiopaque calculi identified. The soft tissues are grossly unremarkable with there is osteopenia with degenerative changes of the spine. Partially visualized right femoral intra medullary hardware. Stop no acute  fracture.  IMPRESSION: Negative.  CT Head: FINDINGS: There  is trace residual subdural hematoma anterior to the right frontal lobe, decreased from prior. The small volume subdural hematoma along the falx on the prior study has also largely resolved, with at most trace residual blood anteriorly. There is no evidence of acute large territory infarct, mass, or midline shift. Chronic infarcts are again seen in the right basal ganglia and right parietal lobe. There is moderate cerebral atrophy.  The orbits are unremarkable. Minimal residual right parietal scalp soft tissue swelling remains. Paranasal sinuses and mastoid air cells are clear. No skull fracture is identified. Calcified atherosclerosis is noted at the skullbase.  IMPRESSION: 1. Near complete resolution of right-sided subdural hematomas. 2. No evidence of new intracranial abnormality.  CXR:  IMPRESSION: Low lung volumes with vascular crowding and bibasilar atelectasis.  Vascular congestion and possible mild interstitial edema.  Healed and acute left rib fractures again demonstrated.  Results/Tests Pending at Time of Discharge: none  Discharge Medications:    Medication List    STOP taking these medications        DULoxetine 60 MG capsule  Commonly known as:  CYMBALTA     Oxycodone HCl 10 MG Tabs      TAKE these medications        aspirin 81 MG EC tablet  Take 81 mg by mouth daily.     atorvastatin 40 MG tablet  Commonly known as:  LIPITOR  Take 1 tablet (40 mg total) by mouth daily.     benztropine 1 MG tablet  Commonly known as:  COGENTIN  Take 3 mg by mouth 3 (three) times daily. TAKE 1/2 TAB  AM, TAKE 1/2 TAB,  10 AM  TAKE 2 MG @ BEDTIME TOTAL OF 3 MG     cetirizine 10 MG tablet  Commonly known as:  ZYRTEC  Take 1 tablet (10 mg total) by mouth daily.     levETIRAcetam 750 MG tablet  Commonly known as:  KEPPRA  TAKE TWO TABLETS BY MOUTH TWICE DAILY.     LYRICA 75 MG capsule  Generic  drug:  pregabalin  TAKE ONE CAPSULE BY MOUTH TWICE DAILY     midodrine 5 MG tablet  Commonly known as:  PROAMATINE  Take 1 tablet by mouth at 6 am and 1 tablet 10 am and 1 tablet at 2 pm daily.     pindolol 5 MG tablet  Commonly known as:  VISKEN  TAKE ONE-HALF TABLET BY MOUTH TWICE DAILY     potassium chloride SA 20 MEQ tablet  Commonly known as:  KLOR-CON M20  Take 1 tablet (20 mEq total) by mouth daily.     risperiDONE 2 MG tablet  Commonly known as:  RISPERDAL  Take 1-4 mg by mouth 3 (three) times daily. Take 1 mg every morning Take 1 mg at lunch Take 4 mg at bedtime     thiamine 100 MG tablet  Take 100 mg by mouth daily.     traZODone 150 MG tablet  Commonly known as:  DESYREL  Take 150 mg by mouth at bedtime.        Discharge Instructions: Please refer to Patient Instructions section of EMR for full details.  Patient was counseled important signs and symptoms that should prompt return to medical care, changes in medications, dietary instructions, activity restrictions, and follow up appointments.   Follow-Up Appointments:     Follow-up Information    Follow up with [facility].   Why:  Home health will call you in 1-2  days to set up first home visit.   Contact information:   [address] [place] [address] [phone]       Follow up with Palma Holter, MD On 02/17/2016.   Specialty:  Family Medicine   Why:  at 9:30AM for hospital follow up    Contact information:   [address] [place] [address] [phone]       Palma Holter, MD 02/09/2016, 3:55 PM PGY-1, [facility]

## 2016-02-09 NOTE — Care Management Note (Signed)
Case Management Note  Patient Details  Name: Jose Li MRN: [id number] Date of Birth: June 14, 1956  Subjective/Objective:                 Spoke with patient over the phone, he states that he does not want to go to CIR at discahrge, that he would prefer to go home. He states that he has crutches and two walkers at home, and declines any further DME. Patient states that he has used [facility] in the past. CM will make referral for PT RN [facility].    Action/Plan:  [facility]  Expected Discharge Date:  02/07/16               Expected Discharge Plan:  Home w Home Health Services  In-House Referral:     Discharge planning Services  CM Consult  Post Acute Care Choice:  NA, Home Health Choice offered to:  Patient  DME Arranged:    DME Agency:     HH Arranged:  RN, PT, Nurse's Aide HH Agency:  [facility]  Status of Service:  Completed, signed off  Medicare Important Message Given:    Date Medicare IM Given:    Medicare IM give by:    Date Additional Medicare IM Given:    Additional Medicare Important Message give by:     If discussed at Long Length of Stay Meetings, dates discussed:    Additional Comments:  Lawerance SabalDebbie Yedidya Duddy, RN 02/09/2016, 11:11 AM

## 2016-02-09 NOTE — Consult Note (Addendum)
Physical Medicine and Rehabilitation Consult  Reason for Consult: Hx of TBI with metabolic encephalopathy Referring Physician: Dr. Randolm Idol   HPI: Jose Li is a 60 y.o. male with history of seizure disorder, bipolar disorder, orthostatic hypotension--on midodrine, h/o alcohol abuse, severe OSA, recent falls due to syncope with small SDH and right radius fracture on 03/14. He was evaluated in ED and sent home but started developing having MS and behavioral changes therefore admitted on 02/05/16 for work up. He was found to be severely hyponatremic with Na-118. Blood cultures done due to leukocytosis and negative.  Celexa and Cymbalta were discontinued and patient placed on 1200 cc/FR.  UDS negative.  Normal saline IVF also added with improvement in sodium levels. CT head done showing near complete resolution of SDH.  CT chest/abdomen revealed minimally displaced acute fracture of left lateral 5th to 10 ribs and no traumatic injury to abdomen/pelvis.  Hyponatremia resolving with improvement in encephalopathy. 2D echo with EF 60-65%, no wall abnormality with grade 1 diastolic dysfunction. PT evaluation done this weekend and CIR recommended for follow up therapy.    He reports that he did not sit up in the chair this weekend but dizziness has resolved. He used to get dizziness just sitting up before and now feels " fantastic".  He would like therapy at home and states his father take care of him.   Review of Systems  Constitutional: Negative for malaise/fatigue.  HENT: Negative for hearing loss.   Eyes: Negative for blurred vision and double vision.  Respiratory: Negative for cough and shortness of breath.   Cardiovascular: Negative for chest pain and palpitations.  Gastrointestinal: Positive for constipation. Negative for heartburn, nausea and vomiting.  Genitourinary: Negative for dysuria and urgency.  Musculoskeletal: Negative for myalgias and neck pain.  Skin: Negative for rash.    Neurological: Negative for dizziness and headaches.  Psychiatric/Behavioral: Negative for depression. The patient has insomnia (due to lack of CPAP here).   All other systems reviewed and are negative.     Past Medical History  Diagnosis Date  . Allergy   . Anxiety   . Depression   . Hyperlipidemia   . Substance abuse   . Seizures (HCC)     none for last 6-14months  . Hypertension, essential, benign 08/02/2012  . Orthostatic hypotension 10/09/2013  . OSA (obstructive sleep apnea) 10/01/2015    Severe OSA with an AHI of 86/hr now on BiPAP at 23/19cm H2O    Past Surgical History  Procedure Laterality Date  . Fracture surgery      right hip and femur  . Joint replacement      right hip  . Tracheostomy  08/1999    Had pneumoniaand was in a coma 10 days  . Hernia repair  07/2009    umbilical hernia/ got infected    Family History  Problem Relation Age of Onset  . Heart disease Mother   . Diabetes Father   . Heart disease Brother   . Heart attack Brother   . Hypertension Neg Hx   . Stroke Neg Hx     Social History:  Single. 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 Lives with 31 year old father who provides supervision. He reports that he has been smoking Cigarettes--about 1/2 PPD.  He has been smoking for 30 years--used to smoke about 1 PPD in the past. He has never used smokeless tobacco. He reports that he does not drink alcohol or use illicit drugs.  Allergies  Allergen Reactions  . Erythromycin   . Hctz [Hydrochlorothiazide]     "Makes him faint" per patient Dr. Tenny Craw ([place]) took him off  . Propranolol     "Makes him faint" per patient Dr. Tenny Craw ([place]) took him off      Medications Prior to Admission  Medication Sig Dispense Refill  . aspirin 81 MG EC tablet Take 81 mg by mouth daily.      Marland Kitchen atorvastatin (LIPITOR) 40 MG tablet Take 1 tablet (40 mg total) by mouth daily. 30 tablet 11  . benztropine (COGENTIN) 1 MG tablet Take 3 mg by mouth 3 (three)  times daily. TAKE 1/2 TAB @6  AM, TAKE 1/2 TAB,  10 AM  TAKE 2 MG @ BEDTIME TOTAL OF 3 MG    . cetirizine (ZYRTEC) 10 MG tablet Take 1 tablet (10 mg total) by mouth daily. 30 tablet 9  . DULoxetine (CYMBALTA) 60 MG capsule Take 60 mg by mouth 2 (two) times daily.     Marland Kitchen levETIRAcetam (KEPPRA) 750 MG tablet TAKE TWO TABLETS BY MOUTH TWICE DAILY. 360 tablet 3  . LYRICA 75 MG capsule TAKE ONE CAPSULE BY MOUTH TWICE DAILY 60 capsule 3  . midodrine (PROAMATINE) 5 MG tablet Take 1 tablet by mouth at 6 am and 1 tablet 10 am and 1 tablet at 2 pm daily. (Patient taking differently: Take 5 mg by mouth See admin instructions. Take 1 tablet by mouth at 6 am and 1 tablet 10 am and 1 tablet at 2 pm daily.) 90 tablet 11  . oxyCODONE 10 MG TABS Take 1 tablet (10 mg total) by mouth every 4 (four) hours as needed for severe pain. 30 tablet 0  . pindolol (VISKEN) 5 MG tablet TAKE ONE-HALF TABLET BY MOUTH TWICE DAILY 30 tablet 9  . potassium chloride SA (KLOR-CON M20) 20 MEQ tablet TAKE TWO TABLETS BY MOUTH IN THE AM & TAKE ONE TABLET BY MOUTH IN THE PM. 270 tablet 2  . risperiDONE (RISPERDAL) 2 MG tablet Take 1-4 mg by mouth 3 (three) times daily. Take 1 mg every morning Take 1 mg at lunch Take 4 mg at bedtime    . thiamine 100 MG tablet Take 100 mg by mouth daily.      . traZODone (DESYREL) 150 MG tablet Take 150 mg by mouth at bedtime.       Home: Home Living Family/patient expects to be discharged to:: Private residence Living Arrangements: Parent (Father) Available Help at Discharge: Family, Available 24 hours/day (Unsure is father is able to assist patient) Type of Home: House Home Layout: Two level, Bed/bath upstairs Alternate Level Stairs-Number of Steps: flight Alternate Level Stairs-Rails: Right Home Equipment: Crutches  Functional History: Prior Function Level of Independence: Independent Comments: Takes bus or gets rides to medical appts Functional Status:  Mobility: Bed Mobility Overal bed  mobility: Needs Assistance Bed Mobility: Supine to Sit, Sit to Supine Supine to sit: Min guard Sit to supine: Min guard General bed mobility comments: Verbal cues to move to EOB - mult cues before patient initiated movement.  Patient sat for approx 2 minutes as PT asking questions.  Patient suddenly returned to supine.  Encouraged patient to sit up again.  Impulsive. Transfers Overall transfer level: Needs assistance Equipment used: None Transfers: Sit to/from Stand Sit to Stand: Mod assist General transfer comment: Patient rocking and standing up quickly.  Immediately lost balance posteriorly and abruptly sat on bed.  Instructed patient to stop, but patient repeated this  activity.  Had patient stop, and then stand using slower technique.  Stood for several seconds to maintain balance prior to gait. Ambulation/Gait Ambulation/Gait assistance: Mod assist Ambulation Distance (Feet): 8 Feet Assistive device: None Gait Pattern/deviations: Step-through pattern, Decreased stride length, Shuffle General Gait Details: Patient with very unsteady gait.  Ambulated short distance and stopped.  Reported he needed to return to bed.  Mod assist to maintain balance.    ADL:    Cognition: Cognition Overall Cognitive Status: Impaired/Different from baseline Orientation Level: Oriented X4 Cognition Arousal/Alertness: Awake/alert Behavior During Therapy: Anxious, Impulsive Overall Cognitive Status: Impaired/Different from baseline Area of Impairment: Orientation, Attention, Safety/judgement, Problem solving, Awareness Orientation Level: Disoriented to, Time, Situation Current Attention Level: Sustained Safety/Judgement: Decreased awareness of deficits, Decreased awareness of safety Problem Solving: Slow processing, Difficulty sequencing, Requires verbal cues   Blood pressure 125/54, pulse 96, temperature 98.5 F (36.9 C), temperature source Oral, resp. rate 16, height 6' (1.829 m), weight 88.7 kg  (195 lb 8.8 oz), SpO2 98 %. Physical Exam  Nursing note and vitals reviewed. Constitutional: He is oriented to person, place, and time. He appears well-developed and well-nourished.  HENT:  Head: Normocephalic.  Mouth/Throat: Oropharynx is clear and moist.  Eyes: Conjunctivae and EOM are normal. Pupils are equal, round, and reactive to light.  Neck: Normal range of motion. Neck supple.  Cardiovascular: Normal rate and regular rhythm.   No murmur heard. Respiratory: Effort normal and breath sounds normal.  GI: Soft. Bowel sounds are normal. He exhibits no distension. There is no tenderness.  Musculoskeletal: He exhibits no edema or tenderness.  Multiple bruises on left knee.   Neurological: He is alert and oriented to person, place, and time.  Slow speech.  Able to follow simple one and two step commands.  Lacks awareness of deficits.   Motor: Bilateral upper extremities 4+/5 proximal to distal Bilateral lower extremities: 4/5 proximal to distal Sensation intact light touch  Skin: Skin is warm and dry.  Psychiatric: His affect is blunt. His speech is delayed. He is slowed. He expresses impulsivity. He exhibits abnormal recent memory.    Results for orders placed or performed during the hospital encounter of 02/05/16 (from the past 24 hour(s))  Basic metabolic panel     Status: Abnormal   Collection Time: 02/08/16 11:11 AM  Result Value Ref Range   Sodium 130 (L) 135 - 145 mmol/L   Potassium 4.5 3.5 - 5.1 mmol/L   Chloride 100 (L) 101 - 111 mmol/L   CO2 22 22 - 32 mmol/L   Glucose, Bld 121 (H) 65 - 99 mg/dL   BUN <5 (L) 6 - 20 mg/dL   Creatinine, Ser 4.540.77 0.61 - 1.24 mg/dL   Calcium 8.7 (L) 8.9 - 10.3 mg/dL   GFR calc non Af Amer >60 >60 mL/min   GFR calc Af Amer >60 >60 mL/min   Anion gap 8 5 - 15  Basic metabolic panel     Status: Abnormal   Collection Time: 02/08/16  6:38 PM  Result Value Ref Range   Sodium 132 (L) 135 - 145 mmol/L   Potassium 3.8 3.5 - 5.1 mmol/L    Chloride 103 101 - 111 mmol/L   CO2 21 (L) 22 - 32 mmol/L   Glucose, Bld 162 (H) 65 - 99 mg/dL   BUN <5 (L) 6 - 20 mg/dL   Creatinine, Ser 0.980.81 0.61 - 1.24 mg/dL   Calcium 9.0 8.9 - 11.910.3 mg/dL   GFR calc non Af Amer >  60 >60 mL/min   GFR calc Af Amer >60 >60 mL/min   Anion gap 8 5 - 15  Basic metabolic panel     Status: Abnormal   Collection Time: 02/09/16  3:24 AM  Result Value Ref Range   Sodium 132 (L) 135 - 145 mmol/L   Potassium 3.6 3.5 - 5.1 mmol/L   Chloride 101 101 - 111 mmol/L   CO2 21 (L) 22 - 32 mmol/L   Glucose, Bld 121 (H) 65 - 99 mg/dL   BUN <5 (L) 6 - 20 mg/dL   Creatinine, Ser 1.61 0.61 - 1.24 mg/dL   Calcium 8.7 (L) 8.9 - 10.3 mg/dL   GFR calc non Af Amer >60 >60 mL/min   GFR calc Af Amer >60 >60 mL/min   Anion gap 10 5 - 15   No results found.  Assessment/Plan: Diagnosis: Hx of TBI with metabolic encephalopathy  [place] score:  >/7  Speech to evaluate for Post traumatic amnesia and interval GOAT scores to assess progress.  NeuroPsych evaluation for behavorial assessment.  Provide environmental management by reducing the level of stimulation, tolerating restlessness when possible, protecting patient from harming self or others and reducing patient's cognitive confusion.  Address behavioral concerns include providing structured environments and daily routines.  Cognitive therapy to direct modular abilities in order to maintain goals including problem solving, self regulation/monitoring, self management, attention, and memory.  Fall precautions; pt at risk for second impact syndrome  Prevention of secondary injury: monitor for hypotension, hypoxia, seizures or signs of increased ICP  Prophylactic AED:   Avoid medications that could impair cognitive abilities, such as anticholinergics, antihistaminic, benzodiazapines, narcotics, etc when possible  1. Does the need for close, 24 hr/day medical supervision in concert with the patient's rehab needs make it  unreasonable for this patient to be served in a less intensive setting? Potentially  2. Co-Morbidities requiring supervision/potential complications: seizure disorder (continue meds), bipolar disorder (continue meds, ensure mood does not limit therapies), orthostatic hypotension (continue med), HTN (monitor and provide prns in accordance with increased physical exertion and pain), h/o alcohol abuse (continue counsel), severe OSA (monitor for daytime somnolence and fatigue, continue CPAP), recent falls due to syncope with small SDH and right radius fracture on 03/14, hyponatremia (continue to monitor and replete as necessary)  3. Due to safety, skin/wound care, disease management, medication administration and patient education, does the patient require 24 hr/day rehab nursing? Potentially 4. Does the patient require coordinated care of a physician, rehab nurse, PT (1-2 hrs/day, 5 days/week), OT (1-2 hrs/day, 5 days/week) and SLP (1-2 hrs/day, 5 days/week) to address physical and functional deficits in the context of the above medical diagnosis(es)? Potentially Addressing deficits in the following areas: balance, endurance, locomotion, strength, transferring, toileting, cognition and psychosocial support 5. Can the patient actively participate in an intensive therapy program of at least 3 hrs of therapy per day at least 5 days per week? Yes 6. The potential for patient to make measurable gains while on inpatient rehab is excellent 7. Anticipated functional outcomes upon discharge from inpatient rehab are modified independent and supervision  with PT, modified independent and supervision with OT, supervision with SLP. 8. Estimated rehab length of stay to reach the above functional goals is: 5-7 days. 9. Does the patient have adequate social supports and living environment to accommodate these discharge functional goals? Yes 10. Anticipated D/C setting: Home 11. Anticipated post D/C treatments: HH therapy  and Home excercise program 12. Overall Rehab/Functional Prognosis:  good  RECOMMENDATIONS: This patient's condition is appropriate for continued rehabilitative care in the following setting: In therapies. Anticipate patient will continue to make gains and will not require a IRF. Will recommend home with home health. Patient has agreed to participate in recommended program. Potentially Note that insurance prior authorization may be required for reimbursement for recommended care.  Comment: Rehab Admissions Coordinator to follow up.  Maryla Morrow, MD 02/09/2016

## 2016-02-09 NOTE — Progress Notes (Signed)
Physical Therapy Treatment Patient Details Name: Jose Li MRN: [id number] DOB: 1956/01/14 Today's Date: 02/09/2016    History of Present Illness Patient is a 60 yo male admitted 02/05/16 with worsening confusion.  Patient with hyponatremia.   PMH:  syncope, orthostatic hypotension, multiple falls, mult SDH, anxiety, depression, bipolar disorder, seizures, OSA    PT Comments    Pt presents with increased activity tolerance and required decreased assist.  Pt remains unsteady and require cues/ assist to negotiate obstacles in halls.  Will inform supervising PT of pt's progress.    Follow Up Recommendations  CIR;Supervision/Assistance - 24 hour     Equipment Recommendations  Rolling walker with 5" wheels    Recommendations for Other Services       Precautions / Restrictions Precautions Precautions: Fall Precaution Comments: Mult falls at home with injury (mult SDH, Rt UE fx, rib fx) Restrictions Weight Bearing Restrictions: No    Mobility  Bed Mobility Overal bed mobility: Needs Assistance Bed Mobility: Supine to Sit     Supine to sit: Supervision;HOB elevated     General bed mobility comments: Cues for sequencing and hand placement to improve ease.    Transfers Overall transfer level: Needs assistance Equipment used: Rolling walker (2 wheeled) Transfers: Sit to/from Stand Sit to Stand: Min guard         General transfer comment: Pt pulls on RW despite cues to push.  pt does a better job reaching back for seated surface with delayed response to VC.    Ambulation/Gait Ambulation/Gait assistance: Min assist Ambulation Distance (Feet): 380 Feet Assistive device: Rolling walker (2 wheeled) Gait Pattern/deviations: Step-through pattern;Drifts right/left;Decreased stride length     General Gait Details: Pt presents with improved steadiness with use of RW.  Pt require VCs and physical assistance to negotiate obstacles in halls despite cueing remains to present with  difficulty.  Pt drifts R during gait, somewhat neglecting R side.     Stairs            Wheelchair Mobility    Modified Rankin (Stroke Patients Only)       Balance             Standing balance-Leahy Scale: Fair                      Cognition Arousal/Alertness: Awake/alert Behavior During Therapy: Anxious;Impulsive (less impulsive from previous tx.  ) Overall Cognitive Status: Impaired/Different from baseline Area of Impairment: Orientation;Attention;Safety/judgement;Problem solving;Awareness         Safety/Judgement: Decreased awareness of deficits;Decreased awareness of safety   Problem Solving: Slow processing;Difficulty sequencing;Requires verbal cues      Exercises      General Comments        Pertinent Vitals/Pain Pain Assessment: 0-10 Pain Score: 2  Pain Location: L rib cage.   Pain Descriptors / Indicators: Sore Pain Intervention(s): Monitored during session;Repositioned    Home Living                      Prior Function            PT Goals (current goals can now be found in the care plan section) Acute Rehab PT Goals Patient Stated Goal: None stated Potential to Achieve Goals: Good Progress towards PT goals: Progressing toward goals    Frequency  Min 3X/week    PT Plan      Co-evaluation             End  of Session Equipment Utilized During Treatment: Gait belt Activity Tolerance: Patient limited by fatigue Patient left: in bed;with call bell/phone within reach;with bed alarm set     Time: 1130-1153 PT Time Calculation (min) (ACUTE ONLY): 23 min  Charges:  $Gait Training: 8-22 mins $Therapeutic Activity: 8-22 mins                    G Codes:      Florestine Aversimee J Khalani Novoa 02/09/2016, 12:00 PM  Joycelyn RuaAimee Marinell Igarashi, PTA pager [phone]

## 2016-02-09 NOTE — Progress Notes (Signed)
Patient was discharged home with home health by MD order; discharged instructions review and give to patient and his dad  with care notes; IV DIC; patient will be escorted to the car by nurse tech via wheelchair.

## 2016-02-10 LAB — CULTURE, BLOOD (ROUTINE X 2)
Culture: NO GROWTH
Culture: NO GROWTH

## 2016-02-11 NOTE — Discharge Summary (Signed)
Physician Discharge Summary  Patient ID: Jose Li MRN: [id number] DOB/AGE: [birth year] 60 y.o.  Admit date: 01/27/2016 Discharge date: 01/28/2016  Discharge Diagnoses Patient Active Problem List   Diagnosis Date Noted  . TBI (traumatic brain injury) (HCC)   . 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 Bipolar affective disorder in remission (HCC)   . Essential hypertension   . Orthostasis   . History of fall   . Radius distal fracture   . Abdominal pain   . Altered mental status   . Hyponatremia   . Seizures (HCC)   . Leukocytosis   . Faintness   . Hypokalemia 02/05/2016  . Toxic metabolic encephalopathy 02/05/2016  . SDH (subdural hematoma) (HCC) 01/28/2016  . Vertigo 12/29/2015  . OSA (obstructive sleep apnea) 10/01/2015  . Orthostatic hypotension 10/09/2013  . Healthcare maintenance 09/21/2013  . Leg swelling 09/01/2012  . Hypertension, essential, benign 08/02/2012  . Routine history and physical examination of adult 02/15/2011  . OTHER DISORDERS OF EAR 05/12/2010  . HERNIA, VENTRAL 04/10/2009  . TOBACCO ABUSE 12/14/2007  . HYPERLIPIDEMIA 10/09/2007  . SYNCOPE 08/29/2007  . Epilepsy (HCC) 05/10/2007  . BIPOLAR DISORDER 01/12/2007  . MIGRAINE, UNSPEC., W/O INTRACTABLE MIGRAINE 01/12/2007    Consultants Dr. Lisbeth Renshaw for neurosurgery   Procedures None   HPI: [name] fell 2 days prior to presentation. The patient has a history of autonomic dysfunction and orthostatic hypotension. He has had many falls in the past. Since that time he complained of a headache and right wrist pain. A CT scan and x-rays showed a 4 mm subdural hematoma on the right, evidence of old infarcts and a nondisplaced right distal radius fracture. He sought care in the emergency room for his headache primarily. He was admitted to the trauma service and neurosurgery and hand surgery were consulted.   Hospital Course: The patient remained clinically and neurologically stable in the  ED for more than 24 hours while waiting on an inpatient bed. Neurosurgery did not feel the need for a repeat head CT given his stability. Outpatient follow-up with hand surgery was arranged for his wrist fracture and he was discharged home in good condition.     Medication List    STOP taking these medications        DULoxetine 60 MG capsule  Commonly known as:  CYMBALTA     ibuprofen 600 MG tablet  Commonly known as:  ADVIL,MOTRIN     potassium chloride SA 20 MEQ tablet  Commonly known as:  KLOR-CON M20      TAKE these medications        aspirin 81 MG EC tablet  Take 81 mg by mouth daily.     atorvastatin 40 MG tablet  Commonly known as:  LIPITOR  Take 1 tablet (40 mg total) by mouth daily.     benztropine 1 MG tablet  Commonly known as:  COGENTIN  Take 3 mg by mouth 3 (three) times daily. TAKE 1/2 TAB  AM, TAKE 1/2 TAB,  10 AM  TAKE 2 MG @ BEDTIME TOTAL OF 3 MG     cetirizine 10 MG tablet  Commonly known as:  ZYRTEC  Take 1 tablet (10 mg total) by mouth daily.     levETIRAcetam 750 MG tablet  Commonly known as:  KEPPRA  TAKE TWO TABLETS BY MOUTH TWICE DAILY.     LYRICA 75 MG capsule  Generic drug:  pregabalin  TAKE ONE CAPSULE BY MOUTH TWICE DAILY  midodrine 5 MG tablet  Commonly known as:  PROAMATINE  Take 1 tablet by mouth at 6 am and 1 tablet 10 am and 1 tablet at 2 pm daily.     pindolol 5 MG tablet  Commonly known as:  VISKEN  TAKE ONE-HALF TABLET BY MOUTH TWICE DAILY     risperiDONE 2 MG tablet  Commonly known as:  RISPERDAL  Take 1-4 mg by mouth 3 (three) times daily. Take 1 mg every morning Take 1 mg at lunch Take 4 mg at bedtime     thiamine 100 MG tablet  Take 100 mg by mouth daily.     traZODone 150 MG tablet  Commonly known as:  DESYREL  Take 150 mg by mouth at bedtime.            Follow-up Information    Follow up with NUNDKUMAR, NEELESH, C, MD In 4 weeks.   Specialty:  Neurosurgery   Contact information:   [address] [place] [address] [phone]       Follow up with Sharma CovertTMANN,FRED W, MD. Call on 01/29/2016.   Specialty:  Orthopedic Surgery   Why:  His office may call you first.  I have spoken with the MD and given him your information   Contact information:   [address] [place] [address] [phone]        Signed: Freeman CaldronMichael J. Laekyn Rayos, PA-C Pager: [phone] General Trauma PA Pager: [phone] 02/11/2016, 9:12 AM

## 2016-02-13 DIAGNOSIS — G4733 Obstructive sleep apnea (adult) (pediatric): Secondary | ICD-10-CM | POA: Diagnosis not present

## 2016-02-16 ENCOUNTER — Encounter: Payer: Self-pay | Admitting: Internal Medicine

## 2016-02-16 ENCOUNTER — Ambulatory Visit (INDEPENDENT_AMBULATORY_CARE_PROVIDER_SITE_OTHER): Payer: Medicare Other | Admitting: Internal Medicine

## 2016-02-16 VITALS — BP 148/82 | HR 84 | Ht 72.0 in | Wt 193.4 lb

## 2016-02-16 DIAGNOSIS — R55 Syncope and collapse: Secondary | ICD-10-CM

## 2016-02-16 DIAGNOSIS — I951 Orthostatic hypotension: Secondary | ICD-10-CM | POA: Diagnosis not present

## 2016-02-16 NOTE — Patient Instructions (Signed)
Your physician recommends that you continue on your current medications as directed. Please refer to the Current Medication list given to you today. We will contact you regarding follow up.

## 2016-02-16 NOTE — Progress Notes (Signed)
Cardiology Office Note   Date:  02/16/2016   ID:  Jose Li, DOB Apr 05, 1956, MRN [id number]  PCP:  Almon Herculesaye T Gonfa, MD  Cardiologist:   Dietrich PatesPaula Jaylan Duggar, MD    Pt presents for f/u of dizziness and syncope     History of Present Illness: Jose Li is a 60 y.o. male with a history of orhotsatic hypotension  I saw him back in Feb  A that time his dizziness was getting worse  I recomm increasing hs midodrine to tid 5 mg  He was seen in ER in march after a syncopal spell  Prior to that he had spell that resulted in a subdural hematoma  He was seen by Odessa FlemingS Klein  He felt a lot of problem was due to meds  He recomm stopping pindolol  Using abdominal binder  Most important reassessing psych meds   Na was found to be 120  Pt admitted Felt to be due to increasein cymbalta.  was stopped  He was sent home on 3/27  SInce d/c he has not had any more syncope  Not dizzy   [facility] has been one adjusting his psych meds     Outpatient Prescriptions Prior to Visit  Medication Sig Dispense Refill  . aspirin 81 MG EC tablet Take 81 mg by mouth daily.      Marland Kitchen. atorvastatin (LIPITOR) 40 MG tablet Take 1 tablet (40 mg total) by mouth daily. 30 tablet 11  . benztropine (COGENTIN) 1 MG tablet Take 3 mg by mouth 3 (three) times daily. TAKE 1/2 TAB @6  AM, TAKE 1/2 TAB,  10 AM  TAKE 2 MG @ BEDTIME TOTAL OF 3 MG    . cetirizine (ZYRTEC) 10 MG tablet Take 1 tablet (10 mg total) by mouth daily. 30 tablet 9  . levETIRAcetam (KEPPRA) 750 MG tablet TAKE TWO TABLETS BY MOUTH TWICE DAILY. 360 tablet 3  . LYRICA 75 MG capsule TAKE ONE CAPSULE BY MOUTH TWICE DAILY 60 capsule 3  . midodrine (PROAMATINE) 5 MG tablet Take 1 tablet by mouth at 6 am and 1 tablet 10 am and 1 tablet at 2 pm daily. (Patient taking differently: Take 5 mg by mouth See admin instructions. Take 1 tablet by mouth at 6 am and 1 tablet 10 am and 1 tablet at 2 pm daily.) 90 tablet 11  . pindolol (VISKEN) 5 MG tablet TAKE ONE-HALF TABLET BY MOUTH TWICE  DAILY 30 tablet 9  . potassium chloride SA (KLOR-CON M20) 20 MEQ tablet Take 1 tablet (20 mEq total) by mouth daily. 30 tablet 0  . risperiDONE (RISPERDAL) 2 MG tablet Take 1-4 mg by mouth 3 (three) times daily. Take 1 mg every morning Take 1 mg at lunch Take 4 mg at bedtime    . thiamine 100 MG tablet Take 100 mg by mouth daily.      . traZODone (DESYREL) 150 MG tablet Take 150 mg by mouth at bedtime.      No facility-administered medications prior to visit.     Allergies:   Duloxetine; Erythromycin; Hctz; and Propranolol   Past Medical History  Diagnosis Date  . Allergy   . Anxiety   . Depression   . Hyperlipidemia   . Substance abuse   . Seizures (HCC)     none for last 6-517months  . Hypertension, essential, benign 08/02/2012  . Orthostatic hypotension 10/09/2013  . OSA (obstructive sleep apnea) 10/01/2015    Severe OSA with an AHI of 86/hr  now on BiPAP at 23/19cm H2O    Past Surgical History  Procedure Laterality Date  . Fracture surgery      right hip and femur  . Joint replacement      right hip  . Tracheostomy  08/1999    Had pneumoniaand was in a coma 10 days  . Hernia repair  07/2009    umbilical hernia/ got infected     Social History:  The patient  reports that he has been smoking Cigarettes.  He has a 9 pack-year smoking history. He has never used smokeless tobacco. He reports that he does not drink alcohol or use illicit drugs.   Family History:  The patient's family history includes Diabetes in his father; Heart attack in his brother; Heart disease in his brother and mother. There is no history of Hypertension or Stroke.    ROS:  Please see the history of present illness. All other systems are reviewed and  Negative to the above problem except as noted.    PHYSICAL EXAM: VS:  BP 148/82 mmHg  Pulse 84  Ht 6' (1.829 m)  Wt 193 lb 6.4 oz (87.726 kg)  BMI 26.22 kg/m2  SpO2 99%  GEN: Well nourished, well developed, in no acute distress HEENT:  normal Neck: no JVD, carotid bruits, or masses Cardiac: RRR; no murmurs, rubs, or gallops,no edema  Respiratory:  clear to auscultation bilaterally, normal work of breathing GI: soft, nontender, nondistended, + BS  No hepatomegaly  MS: no deformity Moving all extremities   Skin: warm and dry, no rash Neuro:  Strength and sensation are intact Psych: euthymic mood, full affect   EKG:  EKG is not  ordered today.   Lipid Panel    Component Value Date/Time   CHOL 176 12/29/2015 1146   TRIG 393* 12/29/2015 1146   HDL 33* 12/29/2015 1146   CHOLHDL 5.3* 12/29/2015 1146   VLDL 79* 12/29/2015 1146   LDLCALC 64 12/29/2015 1146      Wt Readings from Last 3 Encounters:  02/16/16 193 lb 6.4 oz (87.726 kg)  02/08/16 195 lb 8.8 oz (88.7 kg)  02/05/16 200 lb 12.8 oz (91.082 kg)      ASSESSMENT AND PLAN:  1  Syncope  No further spells  Feeling much better  Not dizzy I will get in touch with [facility] re pts meds   Keep on same regimen for now.       Current medicines are reviewed at length with the patient today.  The patient does not have concerns regarding medicines.  The following changes have been made:   Labs/ tests ordered today include: No orders of the defined types were placed in this encounter.     Disposition:   FU with  in   Signed, Dietrich Pates, MD  02/16/2016 3:35 PM    [facility] [address] [place], [place], [place]  [address] Phone: [phone]; Fax: [phone]

## 2016-02-17 ENCOUNTER — Ambulatory Visit (INDEPENDENT_AMBULATORY_CARE_PROVIDER_SITE_OTHER): Payer: Medicare Other | Admitting: Internal Medicine

## 2016-02-17 ENCOUNTER — Encounter: Payer: Self-pay | Admitting: Internal Medicine

## 2016-02-17 VITALS — BP 126/62 | HR 88 | Temp 98.0°F | Ht 72.0 in | Wt 190.0 lb

## 2016-02-17 DIAGNOSIS — Z09 Encounter for follow-up examination after completed treatment for conditions other than malignant neoplasm: Secondary | ICD-10-CM | POA: Diagnosis not present

## 2016-02-17 DIAGNOSIS — E871 Hypo-osmolality and hyponatremia: Secondary | ICD-10-CM | POA: Diagnosis not present

## 2016-02-17 LAB — BASIC METABOLIC PANEL WITH GFR
BUN: 6 mg/dL — ABNORMAL LOW (ref 7–25)
CHLORIDE: 106 mmol/L (ref 98–110)
CO2: 21 mmol/L (ref 20–31)
Calcium: 8.5 mg/dL — ABNORMAL LOW (ref 8.6–10.3)
Creat: 0.99 mg/dL (ref 0.70–1.33)
GFR, EST NON AFRICAN AMERICAN: 84 mL/min (ref >=60)
GFR, Est African American: >89 mL/min (ref >=60)
GLUCOSE: 100 mg/dL — AB (ref 65–99)
Potassium: 3.8 mmol/L (ref 3.5–5.3)
SODIUM: 137 mmol/L (ref 135–146)

## 2016-02-17 NOTE — Patient Instructions (Signed)
We will get blood work to check on your electrolytes.   I am glad you are feeling better.   Please make an appointment with your PCP for a physical as it seems you are due for one.

## 2016-02-17 NOTE — Progress Notes (Signed)
Patient ID: Jose Li, male   DOB: February 13, 1956, 60 y.o.   MRN: [id number] Date of Visit: 02/17/2016   HPI: Patient is here for hospital follow up visit. Patient was admitted for hyponatremia which was thought to be due Cymbalta (had dose increased a few days prior to admission; sodium on file a few days prior to increase of dose, patient's sodium was wnl). His Cymbalta was discontinued. He also has a history of orthostatic hypotension with history of   Patient is doing well. Reports of no confusion since being discharged. Had three "falls" the day he returned home; no loss of consciousness; fell on his bed, and did not hit head. Since then has not had any falls. Notes of improved orthostatic symptoms but they are still present at times; may occurs every other day but has made changes to deal with symptoms.   Was seen by psychiatry on 4/3. Patient and his father reports his Benztropine was increased from 0.5 mg to 1 mg, and Risperdal was increased from 1mg  to 2 mg. He has close follow up again later on this month.   He was also seen by cardiology on 4/3. Per patient, no medication changes were made. They checked orthostatic vitals which were normal per patient and father.   ROS: See HPI.  PMFSH: PMH reviewed   PHYSICAL EXAM: BP 126/62 mmHg  Pulse 88  Temp(Src) 98 F (36.7 C) (Oral)  Ht 6' (1.829 m)  Wt 190 lb (86.183 kg)  BMI 25.76 kg/m2 Gen: NAD Heart: RRR, no m/r/g Lungs: normal effort, CTAB Neuro: no gross deficits noted  Ext: right arm cast noted; no LE edema  ASSESSMENT/PLAN:  Hospital Follow Up for Hyponatremia: - asymptomatic and doing well since discharge - will check BMP to ensure sodium is not worsened since discharge   FOLLOW UP: Follow up PRN   Palma HolterKanishka G Nekeya Briski, MD PGY 1 Family Medicine

## 2016-02-18 ENCOUNTER — Other Ambulatory Visit: Payer: Self-pay | Admitting: Neurology

## 2016-02-18 NOTE — Telephone Encounter (Signed)
Spoke with patient and scheduled his annual FU, will refill medication. He verbalized understanding.

## 2016-02-19 ENCOUNTER — Encounter: Payer: Self-pay | Admitting: Internal Medicine

## 2016-02-26 DIAGNOSIS — S52514D Nondisplaced fracture of right radial styloid process, subsequent encounter for closed fracture with routine healing: Secondary | ICD-10-CM | POA: Diagnosis not present

## 2016-03-01 DIAGNOSIS — I6200 Nontraumatic subdural hemorrhage, unspecified: Secondary | ICD-10-CM | POA: Diagnosis not present

## 2016-03-04 ENCOUNTER — Telehealth: Payer: Self-pay | Admitting: Internal Medicine

## 2016-03-04 ENCOUNTER — Telehealth: Payer: Self-pay | Admitting: *Deleted

## 2016-03-04 ENCOUNTER — Other Ambulatory Visit: Payer: Self-pay | Admitting: Student

## 2016-03-04 NOTE — Telephone Encounter (Signed)
LVM for patient to return my call 

## 2016-03-04 NOTE — Telephone Encounter (Signed)
Message     Pt needs to sign a release that [facility] is [facility] [place] to us.    We can fax it to them or he can go there    I spoke with [facility] today        Message     Left msg at psych services that Rx meds for him No return call    He needs to let me know in future if any changes in these meds are made     ----- Message -----     From: Pricilla RifflePaula Ross V, MD     Sent: 02/20/2016  1:07 AM         RECEIVED ABOVE MESSAGES FROM DR. ROSS.   SPOKE WITH KIM IN [facility]ICAL RECORDS WHO WILL HAVE PATIENT SIGN APPROPRIATE FORM AND WILL [facility]

## 2016-03-05 NOTE — Telephone Encounter (Signed)
2nd request.  Shanti Eichel L, RN  

## 2016-03-14 DIAGNOSIS — G4733 Obstructive sleep apnea (adult) (pediatric): Secondary | ICD-10-CM | POA: Diagnosis not present

## 2016-03-24 ENCOUNTER — Other Ambulatory Visit: Payer: Self-pay | Admitting: Neurology

## 2016-03-24 NOTE — Telephone Encounter (Signed)
Rx printed, signed, faxed to pharmacy. 

## 2016-03-24 NOTE — Telephone Encounter (Signed)
Last OV 10/2015 w/ Eber Jonesarolyn NP, f/u scheduled 10/2016. Last filled 02/18/16.

## 2016-03-25 DIAGNOSIS — S52514D Nondisplaced fracture of right radial styloid process, subsequent encounter for closed fracture with routine healing: Secondary | ICD-10-CM | POA: Diagnosis not present

## 2016-03-28 NOTE — Progress Notes (Signed)
Cardiology Office Note    Date:  03/29/2016   ID:  Jose Li, DOB [birth year], MRN [id number]  PCP:  Almon Hercules, MD  Cardiologist:  Armanda Magic, MD   Chief Complaint  Patient presents with  . Sleep Apnea  . Hypertension    History of Present Illness:  Jose Li is a 60 y.o. male who presents for followup of OSA. He has known severe OSA with an AHI of 86/hr. He is on  BiPAP at 23/19cm H2O. He now presents for follwup. He is doing well on his BiPAP but has not been using it recently due to an accident.  He fell and broke several ribs and his right wrist.  He is now back to using it.  Marland Kitchen He says that he gets at least 4 hours on the BiPAP. He uses a full face mask which he tolerates well. He feels the pressure is adequate. He has had some problems with mouth dryness. He is using the humidifier. Since going on BiPAP he feels more rested in the am and less tired during the day. He sleeps on his side.      Past Medical History  Diagnosis Date  . Allergy   . Anxiety   . Depression   . Hyperlipidemia   . Substance abuse   . Seizures (HCC)     none for last 6-46months  . Hypertension, essential, benign 08/02/2012  . Orthostatic hypotension 10/09/2013  . OSA (obstructive sleep apnea) 10/01/2015    Severe OSA with an AHI of 86/hr now on BiPAP at 23/19cm H2O    Past Surgical History  Procedure Laterality Date  . Fracture surgery      right hip and femur  . Joint replacement      right hip  . Tracheostomy  08/1999    Had pneumoniaand was in a coma 10 days  . Hernia repair  07/2009    umbilical hernia/ got infected    Current Medications: Outpatient Prescriptions Prior to Visit  Medication Sig Dispense Refill  . aspirin 81 MG EC tablet Take 81 mg by mouth daily.      Marland Kitchen atorvastatin (LIPITOR) 40 MG tablet Take 1 tablet (40 mg total) by mouth daily. 30 tablet 11  . benztropine (COGENTIN) 1 MG tablet Take 3 mg by mouth 3 (three) times daily.     . cetirizine  (ZYRTEC) 10 MG tablet Take 1 tablet (10 mg total) by mouth daily. 30 tablet 9  . ibuprofen (ADVIL,MOTRIN) 600 MG tablet TAKE ONE TABLET BY MOUTH TWICE DAILY AS NEEDED FOR HEADACHE OR  MODERATE  PAIN 30 tablet 0  . levETIRAcetam (KEPPRA) 750 MG tablet TAKE TWO TABLETS BY MOUTH TWICE DAILY. (Patient taking differently: Take 1,500 mg by mouth 2 (two) times daily. TAKE TWO TABLETS BY MOUTH TWICE DAILY.) 360 tablet 3  . LYRICA 75 MG capsule TAKE ONE CAPSULE BY MOUTH TWICE DAILY 60 capsule 5  . midodrine (PROAMATINE) 5 MG tablet Take 1 tablet by mouth at 6 am and 1 tablet 10 am and 1 tablet at 2 pm daily. (Patient taking differently: Take 5-10 mg by mouth 3 (three) times daily with meals. ) 90 tablet 11  . Multiple Vitamin (MULTIVITAMIN) tablet Take 1 tablet by mouth daily.    . pindolol (VISKEN) 5 MG tablet TAKE ONE-HALF TABLET BY MOUTH TWICE DAILY 30 tablet 9  . potassium chloride SA (KLOR-CON M20) 20 MEQ tablet Take 1 tablet (20 mEq total) by mouth daily.  30 tablet 0  . risperiDONE (RISPERDAL) 2 MG tablet Take 1-4 mg by mouth 3 (three) times daily. Take 1 mg every morning Take 1 mg at lunch Take 4 mg at bedtime    . thiamine 100 MG tablet Take 100 mg by mouth daily.      . traZODone (DESYREL) 150 MG tablet Take 150 mg by mouth at bedtime.     Marland Kitchen LYRICA 75 MG capsule TAKE ONE CAPSULE BY MOUTH TWICE DAILY 60 capsule 0   No facility-administered medications prior to visit.     Allergies:   Duloxetine; Erythromycin; Hctz; and Propranolol   Social History   Social History  . Marital Status: Single    Spouse Name: N/A  . Number of Children: N/A  . Years of Education: N/A   Social History Main Topics  . Smoking status: Current Every Day Smoker -- 0.30 packs/day for 30 years    Types: Cigarettes  . Smokeless tobacco: Never Used  . Alcohol Use: No     Comment: history of alcohol abuse.  . Drug Use: No  . Sexual Activity: Not Currently   Other Topics Concern  . None   Social History  Narrative     Family History:  The patient's family history includes Diabetes in his father; Heart attack in his brother; Heart disease in his brother and mother. There is no history of Hypertension or Stroke.   ROS:   Please see the history of present illness.    ROS All other systems reviewed and are negative.   PHYSICAL EXAM:   VS:  BP 110/78 mmHg  Pulse 84  Ht 6' (1.829 m)  Wt 199 lb 12.8 oz (90.629 kg)  BMI 27.09 kg/m2  SpO2 97%   GEN: Well nourished, well developed, in no acute distress HEENT: normal Neck: no JVD, carotid bruits, or masses Cardiac: RRR; no murmurs, rubs, or gallops,no edema.  Intact distal pulses bilaterally.  Respiratory:  clear to auscultation bilaterally, normal work of breathing GI: soft, nontender, nondistended, + BS MS: no deformity or atrophy Skin: warm and dry, no rash Neuro:  Alert and Oriented x 3, Strength and sensation are intact Psych: euthymic mood, full affect  Wt Readings from Last 3 Encounters:  03/29/16 199 lb 12.8 oz (90.629 kg)  02/17/16 190 lb (86.183 kg)  02/16/16 193 lb 6.4 oz (87.726 kg)      Studies/Labs Reviewed:   EKG:  EKG is not ordered today.  Recent Labs: 02/05/2016: ALT 22 02/06/2016: TSH 1.518 02/07/2016: Hemoglobin 11.3*; Magnesium 1.7; Platelets 431* 02/17/2016: BUN 6*; Creat 0.99; Potassium 3.8; Sodium 137   Lipid Panel    Component Value Date/Time   CHOL 176 12/29/2015 1146   TRIG 393* 12/29/2015 1146   HDL 33* 12/29/2015 1146   CHOLHDL 5.3* 12/29/2015 1146   VLDL 79* 12/29/2015 1146   LDLCALC 64 12/29/2015 1146    Additional studies/ records that were reviewed today include: BiPAP d/l    ASSESSMENT:    1. OSA (obstructive sleep apnea)   2. Hypertension, essential, benign      PLAN:  In order of problems listed above:  1.  OSA - the patient is tolerating PAP therapy well without any problems. The PAP download was reviewed today and showed an AHI of 3.2/hr on 23/19 cm H2O with 23% compliance in  using more than 4 hours nightly.  The patient has been using and benefiting from CPAP use and will continue to benefit from therapy.  2.  HTN - BP controlled on Pindolol     Medication Adjustments/Labs and Tests Ordered: Current medicines are reviewed at length with the patient today.  Concerns regarding medicines are outlined above.  Medication changes, Labs and Tests ordered today are listed in the Patient Instructions below.  There are no Patient Instructions on file for this visit.   Signed, Armanda Magicraci Beyla Loney, MD  03/29/2016 9:52 AM    [facility] [address], [place], [place]  [address] Phone: [phone]; Fax: [phone]

## 2016-03-29 ENCOUNTER — Ambulatory Visit (INDEPENDENT_AMBULATORY_CARE_PROVIDER_SITE_OTHER): Payer: Medicare Other | Admitting: Cardiology

## 2016-03-29 ENCOUNTER — Encounter: Payer: Self-pay | Admitting: Cardiology

## 2016-03-29 VITALS — BP 110/78 | HR 84 | Ht 72.0 in | Wt 199.8 lb

## 2016-03-29 DIAGNOSIS — G4733 Obstructive sleep apnea (adult) (pediatric): Secondary | ICD-10-CM | POA: Diagnosis not present

## 2016-03-29 DIAGNOSIS — I10 Essential (primary) hypertension: Secondary | ICD-10-CM | POA: Diagnosis not present

## 2016-03-29 NOTE — Patient Instructions (Signed)
Medication Instructions:  Your physician recommends that you continue on your current medications as directed. Please refer to the Current Medication list given to you today.    Labwork: None ordered  Testing/Procedures: None ordered  Follow-Up: Your physician wants you to follow-up in: 1 YEAR WITH DR. TURNER    You will receive a reminder letter in the mail two months in advance. If you don't receive a letter, please call our office to schedule the follow-up appointment.   Any Other Special Instructions Will Be Listed Below (If Applicable).     If you need a refill on your cardiac medications before your next appointment, please call your pharmacy.  . 

## 2016-04-05 ENCOUNTER — Encounter: Payer: Self-pay | Admitting: Cardiology

## 2016-04-07 ENCOUNTER — Telehealth: Payer: Self-pay | Admitting: Internal Medicine

## 2016-04-07 NOTE — Telephone Encounter (Signed)
Called patient's father back. Patient saw a different psychiatrist who decreased his bedtime dose of Risperdal to 2 mg from 4 mg. Other daytime doses are the same.  He has follow up with psychiatry on 6/19.  Will forward to Dr. Tenny Crawoss to make aware and for any new recommendations.

## 2016-04-07 NOTE — Telephone Encounter (Signed)
New Message:  Pt's father is calling in to speak with the nurse about the dosage for the pt's Risperidone medication. Please f/u with him .

## 2016-04-09 NOTE — Telephone Encounter (Signed)
°  Follow Up   Pts father is calling regarding medication concern below. Please call.

## 2016-04-09 NOTE — Telephone Encounter (Signed)
Spoke with Mr. Jose Li.  They picked up the new medicine bottles today (Risperdal and Cogentin)  He wanted to clarify the dosing instructions for Dr. Tenny Crawoss.  Medicine list updated. He is taking a decrease dose of each medication.

## 2016-04-13 ENCOUNTER — Telehealth: Payer: Self-pay | Admitting: Cardiology

## 2016-04-13 ENCOUNTER — Telehealth: Payer: Self-pay | Admitting: Internal Medicine

## 2016-04-13 NOTE — Telephone Encounter (Signed)
Follow Up:   He talked to you on Friday and was supposed to give you some information today.

## 2016-04-13 NOTE — Telephone Encounter (Signed)
New message  Pt called states that he is having faint spells of High blood pressure.  Pt c/o BP issue:  1. What are your last 5 BP readings?No readings  2. Are you having any other symptoms (ex. Dizziness, headache, blurred vision, passed out)? Dizziness, headache, blurred vision, states that he hasn't passed out this time but he has before.  ( Experienced these symptoms for the last couple of weeks   3. What is your medication issue? Not sure.

## 2016-04-13 NOTE — Telephone Encounter (Signed)
Left message to call back  

## 2016-04-13 NOTE — Telephone Encounter (Signed)
Left a message for patient's father to call back. 

## 2016-04-14 DIAGNOSIS — G4733 Obstructive sleep apnea (adult) (pediatric): Secondary | ICD-10-CM | POA: Diagnosis not present

## 2016-04-14 NOTE — Telephone Encounter (Signed)
Patient's father , Illa LevelGorden Deleonardis wanted to clarify that benztropine dose is 1 mg once daily.  Pt will take this medication everyday at 2 pm.

## 2016-04-14 NOTE — Telephone Encounter (Signed)
Follow Up: ° ° ° °Returning your call from yesterday. °

## 2016-04-14 NOTE — Telephone Encounter (Signed)
Spoke with patient. He reports that when he gets out of bed things get a little blurry and he feels weak at first.  He has no blood pressure readings but feels like it is fluctuating.   I advised patient to remember to change positions slowly, especially when getting up out of bed or up from a chair.  He verbalizes understanding and agreement. Discussed that 2 of his medications (risperdol and Cogentin) have been reduced recently, and he has f/u with psyche in June.  He asked when he is due to follow up with Dr. Mayford Knifeurner and Dr. Tenny Crawoss. Advised patient he will see Dr. Mayford Knifeurner in 1 year and scheduled a 3 months f/u with Dr. Tenny Crawoss for 06/10/16.  Advised patient to call back with any worsening/new concerns. He verbalizes understanding and agreement.

## 2016-05-12 ENCOUNTER — Telehealth: Payer: Self-pay | Admitting: Internal Medicine

## 2016-05-12 NOTE — Telephone Encounter (Signed)
Keep eye on blood pressure .  Call back if dizziness worsens or he passes out.

## 2016-05-12 NOTE — Telephone Encounter (Signed)
Medication list updated per message below. Forwarding to Dr. Tenny Crawoss as Lorain ChildesFYI

## 2016-05-12 NOTE — Telephone Encounter (Signed)
New message     Pt c/o medication issue:  1. Name of Medication: Risperdone and Citalopram  2. How are you currently taking this medication (dosage and times per day)? 2 mg po daily and 20 mg daily  3. Are you having a reaction (difficulty breathing--STAT)? no  4. What is your medication issue? The father was just letting you know if the changes made on the medications

## 2016-05-13 NOTE — Telephone Encounter (Signed)
Spoke with patient's father and informed.  Mr. Pieter PartridgeWells verbalizes understanding and agreement.

## 2016-05-14 DIAGNOSIS — G4733 Obstructive sleep apnea (adult) (pediatric): Secondary | ICD-10-CM | POA: Diagnosis not present

## 2016-05-20 ENCOUNTER — Other Ambulatory Visit: Payer: Self-pay | Admitting: Student

## 2016-05-21 DIAGNOSIS — G4733 Obstructive sleep apnea (adult) (pediatric): Secondary | ICD-10-CM | POA: Diagnosis not present

## 2016-05-25 ENCOUNTER — Telehealth: Payer: Self-pay | Admitting: Neurology

## 2016-05-25 NOTE — Telephone Encounter (Signed)
Patient called to reschedule December 5th appointment, conflicts with something else this day. Appointment rescheduled to Monday December 4th.

## 2016-05-25 NOTE — Telephone Encounter (Signed)
Patient called back to say, he can keep original appointment on December 5th. Appointment rescheduled to original day and time, Tuesday December 5th 11:15am.

## 2016-06-10 ENCOUNTER — Ambulatory Visit (INDEPENDENT_AMBULATORY_CARE_PROVIDER_SITE_OTHER): Payer: Medicare Other | Admitting: Internal Medicine

## 2016-06-10 ENCOUNTER — Encounter: Payer: Self-pay | Admitting: Internal Medicine

## 2016-06-10 VITALS — BP 142/65 | HR 93 | Ht 72.0 in | Wt 195.6 lb

## 2016-06-10 DIAGNOSIS — R000 Tachycardia, unspecified: Secondary | ICD-10-CM | POA: Diagnosis not present

## 2016-06-10 DIAGNOSIS — I951 Orthostatic hypotension: Secondary | ICD-10-CM

## 2016-06-10 DIAGNOSIS — G90A Postural orthostatic tachycardia syndrome (POTS): Secondary | ICD-10-CM

## 2016-06-10 NOTE — Progress Notes (Signed)
Cardiology Office Note   Date:  06/10/2016   ID:  Jose Li, DOB 1956-08-17, MRN [id number]  PCP:  Almon Hercules, MD  Cardiologist:   Dietrich Pates, MD   No chief complaint on file.  F/U of dizziness, syncope     History of Present Illness: Jose Li is a 60 y.o. male with a history of Orthostatic hypotenisin  I saw him in April  Had signif problems earlier this year with syncope, subdural hematomas.  Seems related to medicine changes  Since I saw him this spring he denies syncope  Still is dizzy a couple times per wk  Drinking fluids        Outpatient Medications Prior to Visit  Medication Sig Dispense Refill  . aspirin 81 MG EC tablet Take 81 mg by mouth daily.      Marland Kitchen atorvastatin (LIPITOR) 40 MG tablet Take 1 tablet (40 mg total) by mouth daily. 30 tablet 11  . benztropine (COGENTIN) 1 MG tablet Take 1 mg by mouth daily.     . cetirizine (ZYRTEC) 10 MG tablet TAKE ONE TABLET BY MOUTH ONCE DAILY 30 tablet 0  . citalopram (CELEXA) 20 MG tablet Take 20 mg by mouth daily.    Marland Kitchen ibuprofen (ADVIL,MOTRIN) 600 MG tablet TAKE ONE TABLET BY MOUTH TWICE DAILY AS NEEDED FOR HEADACHE OR  MODERATE  PAIN 30 tablet 0  . levETIRAcetam (KEPPRA) 750 MG tablet TAKE TWO TABLETS BY MOUTH TWICE DAILY. (Patient taking differently: Take 1,500 mg by mouth 2 (two) times daily. TAKE TWO TABLETS BY MOUTH TWICE DAILY.) 360 tablet 3  . LYRICA 75 MG capsule TAKE ONE CAPSULE BY MOUTH TWICE DAILY 60 capsule 5  . midodrine (PROAMATINE) 5 MG tablet Take 1 tablet by mouth at 6 am and 1 tablet 10 am and 1 tablet at 2 pm daily. (Patient taking differently: Take 5-10 mg by mouth 3 (three) times daily with meals. ) 90 tablet 11  . Multiple Vitamin (MULTIVITAMIN) tablet Take 1 tablet by mouth daily.    . pindolol (VISKEN) 5 MG tablet TAKE ONE-HALF TABLET BY MOUTH TWICE DAILY 30 tablet 9  . potassium chloride SA (KLOR-CON M20) 20 MEQ tablet Take 1 tablet (20 mEq total) by mouth daily. 30 tablet 0  . risperiDONE  (RISPERDAL) 2 MG tablet Take 2 mg by mouth at bedtime.     . thiamine 100 MG tablet Take 100 mg by mouth daily.      . traZODone (DESYREL) 150 MG tablet Take 150 mg by mouth at bedtime.      No facility-administered medications prior to visit.      Allergies:   Duloxetine; Erythromycin; Hctz [hydrochlorothiazide]; and Propranolol   Past Medical History:  Diagnosis Date  . Allergy   . Anxiety   . Depression   . Hyperlipidemia   . Hypertension, essential, benign 08/02/2012  . Orthostatic hypotension 10/09/2013  . OSA (obstructive sleep apnea) 10/01/2015   Severe OSA with an AHI of 86/hr now on BiPAP at 23/19cm H2O  . Seizures (HCC)    none for last 6-68months  . Substance abuse     Past Surgical History:  Procedure Laterality Date  . FRACTURE SURGERY     right hip and femur  . HERNIA REPAIR  07/2009   umbilical hernia/ got infected  . JOINT REPLACEMENT     right hip  . TRACHEOSTOMY  08/1999   Had pneumoniaand was in a coma 10 days     Social  History:  The patient  reports that he has been smoking Cigarettes.  He has a 9.00 pack-year smoking history. He has never used smokeless tobacco. He reports that he does not drink alcohol or use drugs.   Family History:  The patient's family history includes Diabetes in his father; Heart attack in his brother; Heart disease in his brother and mother.    ROS:  Please see the history of present illness. All other systems are reviewed and  Negative to the above problem except as noted.    PHYSICAL EXAM: VS:  BP (!) 142/65   Pulse 93   Ht 6' (1.829 m)   Wt 195 lb 9.6 oz (88.7 kg)   BMI 26.53 kg/m   GEN: Well nourished, well developed, in no acute distress  HEENT: normal  Neck: no JVD, carotid bruits, or masses Cardiac: RRR; no murmurs, rubs, or gallops,no edema  Respiratory:  clear to auscultation bilaterally, normal work of breathing GI: soft, nontender, nondistended, + BS  No hepatomegaly  MS: no deformity Moving all  extremities   Skin: warm and dry, no rash Neuro:  Strength and sensation are intact Psych: euthymic mood, full affect   EKG:  EKG is not  ordered today.   Lipid Panel    Component Value Date/Time   CHOL 176 12/29/2015 1146   TRIG 393 (H) 12/29/2015 1146   HDL 33 (L) 12/29/2015 1146   CHOLHDL 5.3 (H) 12/29/2015 1146   VLDL 79 (H) 12/29/2015 1146   LDLCALC 64 12/29/2015 1146      Wt Readings from Last 3 Encounters:  06/10/16 195 lb 9.6 oz (88.7 kg)  03/29/16 199 lb 12.8 oz (90.6 kg)  02/17/16 190 lb (86.2 kg)      ASSESSMENT AND PLAN:  1  Dizziness/ syncope.  Pt still has drop in BP with standing at 3 min (150 to 122) I would not change meds  (he did nto tolerate florinef due to hypokalemia)   Would check BMET today    F?u later this year   Increase actvity.  Elevate head of bed  Continue fluids/salt         Signed, Dietrich Pates, MD  06/10/2016 3:48 PM    [facility] [address] [place], [place], [place]  [address] Phone: [phone]; Fax: [phone]

## 2016-06-10 NOTE — Patient Instructions (Signed)
Your physician recommends that you continue on your current medications as directed. Please refer to the Current Medication list given to you today.  Your physician recommends that you schedule a follow-up appointment in: November, 2017 with Dr. Tenny Craw.

## 2016-06-11 ENCOUNTER — Telehealth: Payer: Self-pay | Admitting: Internal Medicine

## 2016-06-11 LAB — BASIC METABOLIC PANEL
BUN: <2 mg/dL — ABNORMAL LOW (ref 7–25)
CALCIUM: 8.4 mg/dL — AB (ref 8.6–10.3)
CO2: 23 mmol/L (ref 20–31)
Chloride: 108 mmol/L (ref 98–110)
Creat: 0.94 mg/dL (ref 0.70–1.33)
Glucose, Bld: 90 mg/dL (ref 65–99)
Potassium: 3.6 mmol/L (ref 3.5–5.3)
SODIUM: 141 mmol/L (ref 135–146)

## 2016-06-11 NOTE — Telephone Encounter (Signed)
Informed patient of lab results.  Verbalizes understanding and appreciation.

## 2016-06-11 NOTE — Telephone Encounter (Signed)
Fu  Pt returning RN phone call- lab results. Please call back and discuss.   

## 2016-06-14 DIAGNOSIS — G4733 Obstructive sleep apnea (adult) (pediatric): Secondary | ICD-10-CM | POA: Diagnosis not present

## 2016-06-24 ENCOUNTER — Other Ambulatory Visit: Payer: Self-pay | Admitting: Student

## 2016-07-07 ENCOUNTER — Ambulatory Visit (INDEPENDENT_AMBULATORY_CARE_PROVIDER_SITE_OTHER): Payer: Medicare Other | Admitting: *Deleted

## 2016-07-07 ENCOUNTER — Encounter: Payer: Self-pay | Admitting: *Deleted

## 2016-07-07 DIAGNOSIS — Z23 Encounter for immunization: Secondary | ICD-10-CM

## 2016-07-07 DIAGNOSIS — Z1159 Encounter for screening for other viral diseases: Secondary | ICD-10-CM

## 2016-07-07 DIAGNOSIS — Z0000 Encounter for general adult medical examination without abnormal findings: Secondary | ICD-10-CM | POA: Diagnosis not present

## 2016-07-07 LAB — HEPATITIS C ANTIBODY: HCV Ab: NEGATIVE

## 2016-07-07 NOTE — Progress Notes (Signed)
Subjective:   Jose Li is a 60 y.o. male who presents for an Initial Medicare Annual Wellness Visit.   Review of Systems  Cardiac Risk Factors include: advanced age (>34men, >33 women);male gender;smoking/ tobacco exposure;sedentary lifestyle    Objective:    Today's Vitals   07/07/16 0837  BP: 107/66  Weight: 187 lb (84.8 kg)  Height: 5' 11.5" (1.816 m)   Body mass index is 25.72 kg/m.  Current Medications (verified) Outpatient Encounter Prescriptions as of 07/07/2016  Medication Sig  . aspirin 81 MG EC tablet Take 81 mg by mouth daily.    Marland Kitchen atorvastatin (LIPITOR) 40 MG tablet Take 1 tablet (40 mg total) by mouth daily.  . benztropine (COGENTIN) 1 MG tablet Take 1 mg by mouth daily.   . cetirizine (ZYRTEC) 10 MG tablet TAKE ONE TABLET BY MOUTH ONCE DAILY.  . citalopram (CELEXA) 20 MG tablet Take 20 mg by mouth daily.  Marland Kitchen ibuprofen (ADVIL,MOTRIN) 600 MG tablet TAKE ONE TABLET BY MOUTH TWICE DAILY AS NEEDED FOR HEADACHE OR  MODERATE  PAIN  . levETIRAcetam (KEPPRA) 750 MG tablet TAKE TWO TABLETS BY MOUTH TWICE DAILY. (Patient taking differently: Take 1,500 mg by mouth 2 (two) times daily. TAKE TWO TABLETS BY MOUTH TWICE DAILY.)  . LYRICA 75 MG capsule TAKE ONE CAPSULE BY MOUTH TWICE DAILY  . midodrine (PROAMATINE) 5 MG tablet Take 1 tablet by mouth at 6 am and 1 tablet 10 am and 1 tablet at 2 pm daily. (Patient taking differently: Take 5-10 mg by mouth 3 (three) times daily with meals. )  . Multiple Vitamin (MULTIVITAMIN) tablet Take 1 tablet by mouth daily.  . pindolol (VISKEN) 5 MG tablet TAKE ONE-HALF TABLET BY MOUTH TWICE DAILY  . potassium chloride SA (KLOR-CON M20) 20 MEQ tablet Take 1 tablet (20 mEq total) by mouth daily.  . risperiDONE (RISPERDAL) 2 MG tablet Take 2 mg by mouth at bedtime.   . thiamine 100 MG tablet Take 100 mg by mouth daily.    . traZODone (DESYREL) 150 MG tablet Take 150 mg by mouth at bedtime.    No facility-administered encounter medications on  file as of 07/07/2016.     Allergies (verified) Duloxetine; Erythromycin; Hctz [hydrochlorothiazide]; and Propranolol   History: Past Medical History:  Diagnosis Date  . Allergy   . Anxiety   . Depression   . Hyperlipidemia   . Hypertension, essential, benign 08/02/2012  . Orthostatic hypotension 10/09/2013  . OSA (obstructive sleep apnea) 10/01/2015   Severe OSA with an AHI of 86/hr now on BiPAP at 23/19cm H2O  . Seizures (HCC)    none for last 6-60months  . Substance abuse    Past Surgical History:  Procedure Laterality Date  . FRACTURE SURGERY  1990   right hip and femur  . HERNIA REPAIR  07/2009   umbilical hernia/ got infected  . JOINT REPLACEMENT Right 1990   right hip  . TRACHEOSTOMY  08/1999   Had pneumoniaand was in a coma 10 days   Family History  Problem Relation Age of Onset  . Heart disease Mother   . Diabetes Father   . Heart disease Brother   . Hypertension Neg Hx   . Stroke Neg Hx    Social History   Occupational History  . diability    Social History Main Topics  . Smoking status: Current Every Day Smoker    Packs/day: 0.50    Years: 30.00    Types: Cigarettes  . Smokeless  tobacco: Never Used     Comment: Had cut back, but stress has prompted him to smoke again  . Alcohol use No     Comment: history of alcohol abuse.  . Drug use: No  . Sexual activity: Not Currently   Tobacco Counseling Ready to quit: No Counseling given: Yes   Activities of Daily Living In your present state of health, do you have any difficulty performing the following activities: 07/07/2016 02/05/2016  Hearing? N N  Vision? Y N  Difficulty concentrating or making decisions? N N  Walking or climbing stairs? N Y  Dressing or bathing? N N  Doing errands, shopping? N Y  Quarry managerreparing Food and eating ? N -  Using the Toilet? N -  In the past six months, have you accidently leaked urine? Y -  Do you have problems with loss of bowel control? N -  Managing your Medications?  N -  Managing your Finances? N -  Housekeeping or managing your Housekeeping? N -  Some recent data might be Li   Home Safety Jose Li with his father. He reports their home has smoke alarms, railings and is free from wires/clutter/fall hazards. They plan to sell their four bedroom home and move into an apartment in the next 2-3 months.  Immunizations and Health Maintenance Immunization History  Administered Date(s) Administered  . Influenza Split 08/18/2011, 08/02/2012  . Influenza Whole 09/06/2007, 08/21/2008, 08/22/2008  . Influenza,inj,Quad PF,36+ Mos 09/21/2013, 09/13/2014  . Pneumococcal Polysaccharide-23 12/29/2015  . Td 11/15/2005  . Tdap 01/28/2016   Health Maintenance Due  Topic Date Due  . Hepatitis C Screening  Aug 07, 1956  . INFLUENZA VACCINE  06/15/2016    Patient Care Team: Almon Herculesaye T Gonfa, MD as PCP - General Pricilla RifflePaula V Ross, MD as Consulting Physician (Cardiology) York Spanielharles K Willis, MD as Consulting Physician (Neurology)  Indicate any recent Medical Services you may have received from other than [facility] providers in the past year (date may be approximate).    Assessment:   This is a routine wellness examination for Jose Li.   Hearing/Vision screen  Hearing Screening   125Hz  250Hz  500Hz  1000Hz  2000Hz  3000Hz  4000Hz  6000Hz  8000Hz   Right ear:   40 40 40  40    Left ear:   40 40 40  40      Dietary issues and exercise activities discussed: Current Exercise Habits: Home exercise routine, Type of exercise: walking;strength training/weights (Does weight lifting. Has membership to [employer] Time (Minutes): 30, Frequency (Times/Week): 4 (Was walking daily, now its every other day.), Weekly Exercise (Minutes/Week): 120, Intensity: Mild  Goals    . PHQ-9 < 5 (pt-stated)          Jose Li would like to get his depression under control. He is on medication and sees counsel[facility]. He would like to get hsi PHQ-9 under 5.      Depression Screen PHQ 2/9 Scores  07/07/2016 02/17/2016 12/29/2015 05/29/2015  PHQ - 2 Score 3 0 0 0  PHQ- 9 Score 14 - - -  Jose Li is followed by [facility] for his depression, and sees them every 6-8 weeks. He was previously on Cymbalta, but had adverse reactions (falls). He reports taking his medication as prescribed.  Fall Risk Fall Risk  07/07/2016 12/29/2015 09/21/2013  Falls in the past year? Yes Yes No  Number falls in past yr: 2 or more - -  Injury with Fall? Yes (No Data) -  Risk Factor Category  High Fall Risk - -  Risk for fall due to : History of fall(s);Medication side effect;Mental status change - -  Follow up Falls prevention discussed;Follow up appointment - -  TUG Test: 8 Seconds  Cognitive Function: Mini-Cog passed: 3/5  Screening Tests Health Maintenance  Topic Date Due  . Hepatitis C Screening  June 06, 1956  . INFLUENZA VACCINE  06/15/2016  . COLONOSCOPY  05/03/2021  . TETANUS/TDAP  01/27/2026  . HIV Screening  Completed        Plan:    Hep C and Flu Vaccine completed today.  Jose Li has an appointment with [facility] next week. Appointments with Cardiology and his "CPAP doctor" in December. He will schedule an appointment with his eye doctor soon to get his vision checked.  During the course of the visit Jose Li was educated and counseled about the following appropriate screening and preventive services:   Vaccines to include Pneumoccal, Influenza, Hepatitis B, Td, Zostavax, HCV  Colorectal cancer screening  Cardiovascular disease screening  Diabetes screening  Nutrition counseling  Smoking cessation counseling  Patient Instructions (the written plan) were given to the patient.   Haugh, Lorene Dyhristie   07/07/2016

## 2016-07-07 NOTE — Patient Instructions (Addendum)
It was a pleasure meeting you today!   Before you leave today, we will do your Hepatitis C screening and flu shot. Keep up the great work with your regular walking, and keeping your doctor's appointments.   I hope to see you again next year for your Medicare Wellness Visit! Have a great day!  [facility]   Health Maintenance, Male A healthy lifestyle and preventative care can promote health and wellness.  Maintain regular health, dental, and eye exams.  Eat a healthy diet. Foods like vegetables, fruits, whole grains, low-fat dairy products, and lean protein foods contain the nutrients you need and are low in calories. Decrease your intake of foods high in solid fats, added sugars, and salt. Get information about a proper diet from your health care provider, if necessary.  Regular physical exercise is one of the most important things you can do for your health. Most adults should get at least 150 minutes of moderate-intensity exercise (any activity that increases your heart rate and causes you to sweat) each week. In addition, most adults need muscle-strengthening exercises on 2 or more days a week.   Maintain a healthy weight. The body mass index (BMI) is a screening tool to identify possible weight problems. It provides an estimate of body fat based on height and weight. Your health care provider can find your BMI and can help you achieve or maintain a healthy weight. For males 20 years and older:  A BMI below 18.5 is considered underweight.  A BMI of 18.5 to 24.9 is normal.  A BMI of 25 to 29.9 is considered overweight.  A BMI of 30 and above is considered obese.  Maintain normal blood lipids and cholesterol by exercising and minimizing your intake of saturated fat. Eat a balanced diet with plenty of fruits and vegetables. Blood tests for lipids and cholesterol should begin at age 58 and be repeated every 5 years. If your lipid or cholesterol levels are high, you are over age 58, or you  are at high risk for heart disease, you may need your cholesterol levels checked more frequently.Ongoing high lipid and cholesterol levels should be treated with medicines if diet and exercise are not working.  If you smoke, find out from your health care provider how to quit. If you do not use tobacco, do not start.  Lung cancer screening is recommended for adults aged 55-80 years who are at high risk for developing lung cancer because of a history of smoking. A yearly low-dose CT scan of the lungs is recommended for people who have at least a 30-pack-year history of smoking and are current smokers or have quit within the past 15 years. A pack year of smoking is smoking an average of 1 pack of cigarettes a day for 1 year (for example, a 30-pack-year history of smoking could mean smoking 1 pack a day for 30 years or 2 packs a day for 15 years). Yearly screening should continue until the smoker has stopped smoking for at least 15 years. Yearly screening should be stopped for people who develop a health problem that would prevent them from having lung cancer treatment.  If you choose to drink alcohol, do not have more than 2 drinks per day. One drink is considered to be 12 oz (360 mL) of beer, 5 oz (150 mL) of wine, or 1.5 oz (45 mL) of liquor.  Avoid the use of street drugs. Do not share needles with anyone. Ask for help if you need support  or instructions about stopping the use of drugs.  High blood pressure causes heart disease and increases the risk of stroke. High blood pressure is more likely to develop in:  People who have blood pressure in the end of the normal range (100-139/85-89 mm Hg).  People who are overweight or obese.  People who are African American.  If you are 20-110 years of age, have your blood pressure checked every 3-5 years. If you are 82 years of age or older, have your blood pressure checked every year. You should have your blood pressure measured twice--once when you are at  a hospital or clinic, and once when you are not at a hospital or clinic. Record the average of the two measurements. To check your blood pressure when you are not at a hospital or clinic, you can use:  An automated blood pressure machine at a pharmacy.  A home blood pressure monitor.  If you are 62-40 years old, ask your health care provider if you should take aspirin to prevent heart disease.  Diabetes screening involves taking a blood sample to check your fasting blood sugar level. This should be done once every 3 years after age 54 if you are at a normal weight and without risk factors for diabetes. Testing should be considered at a younger age or be carried out more frequently if you are overweight and have at least 1 risk factor for diabetes.  Colorectal cancer can be detected and often prevented. Most routine colorectal cancer screening begins at the age of 58 and continues through age 56. However, your health care provider may recommend screening at an earlier age if you have risk factors for colon cancer. On a yearly basis, your health care provider may provide home test kits to check for hidden blood in the stool. A small camera at the end of a tube may be used to directly examine the colon (sigmoidoscopy or colonoscopy) to detect the earliest forms of colorectal cancer. Talk to your health care provider about this at age 73 when routine screening begins. A direct exam of the colon should be repeated every 5-10 years through age 25, unless early forms of precancerous polyps or small growths are found.  People who are at an increased risk for hepatitis B should be screened for this virus. You are considered at high risk for hepatitis B if:  You were born in a country where hepatitis B occurs often. Talk with your health care provider about which countries are considered high risk.  Your parents were born in a high-risk country and you have not received a shot to protect against hepatitis B  (hepatitis B vaccine).  You have HIV or AIDS.  You use needles to inject street drugs.  You live with, or have sex with, someone who has hepatitis B.  You are a man who has sex with other men (MSM).  You get hemodialysis treatment.  You take certain medicines for conditions like cancer, organ transplantation, and autoimmune conditions.  Hepatitis C blood testing is recommended for all people born from [birth year] through 1963 and any individual with known risk factors for hepatitis C.  Healthy men should no longer receive prostate-specific antigen (PSA) blood tests as part of routine cancer screening. Talk to your health care provider about prostate cancer screening.  Testicular cancer screening is not recommended for adolescents or adult males who have no symptoms. Screening includes self-exam, a health care provider exam, and other screening tests. Consult with your health  care provider about any symptoms you have or any concerns you have about testicular cancer.  Practice safe sex. Use condoms and avoid high-risk sexual practices to reduce the spread of sexually transmitted infections (STIs).  You should be screened for STIs, including gonorrhea and chlamydia if:  You are sexually active and are younger than 24 years.  You are older than 24 years, and your health care provider tells you that you are at risk for this type of infection.  Your sexual activity has changed since you were last screened, and you are at an increased risk for chlamydia or gonorrhea. Ask your health care provider if you are at risk.  If you are at risk of being infected with HIV, it is recommended that you take a prescription medicine daily to prevent HIV infection. This is called pre-exposure prophylaxis (PrEP). You are considered at risk if:  You are a man who has sex with other men (MSM).  You are a heterosexual man who is sexually active with multiple partners.  You take drugs by injection.  You are  sexually active with a partner who has HIV.  Talk with your health care provider about whether you are at high risk of being infected with HIV. If you choose to begin PrEP, you should first be tested for HIV. You should then be tested every 3 months for as long as you are taking PrEP.  Use sunscreen. Apply sunscreen liberally and repeatedly throughout the day. You should seek shade when your shadow is shorter than you. Protect yourself by wearing long sleeves, pants, a wide-brimmed hat, and sunglasses year round whenever you are outdoors.  Tell your health care provider of new moles or changes in moles, especially if there is a change in shape or color. Also, tell your health care provider if a mole is larger than the size of a pencil eraser.  A one-time screening for abdominal aortic aneurysm (AAA) and surgical repair of large AAAs by ultrasound is recommended for men aged 51-75 years who are current or former smokers.  Stay current with your vaccines (immunizations).   This information is not intended to replace advice given to you by your health care provider. Make sure you discuss any questions you have with your health care provider.   Document Released: 04/29/2008 Document Revised: 11/22/2014 Document Reviewed: 03/29/2011 Elsevier Interactive Patient Education [employer].

## 2016-07-22 ENCOUNTER — Other Ambulatory Visit: Payer: Self-pay | Admitting: Student

## 2016-08-04 ENCOUNTER — Other Ambulatory Visit: Payer: Self-pay | Admitting: Internal Medicine

## 2016-08-25 DIAGNOSIS — G4733 Obstructive sleep apnea (adult) (pediatric): Secondary | ICD-10-CM | POA: Diagnosis not present

## 2016-08-26 ENCOUNTER — Other Ambulatory Visit: Payer: Self-pay | Admitting: Student

## 2016-08-27 NOTE — Telephone Encounter (Signed)
Pt needs a refill on Cetirizine 10mg . Pharmacy on file is correct. Please advise. Thanks! ep

## 2016-09-02 ENCOUNTER — Other Ambulatory Visit: Payer: Self-pay | Admitting: Student

## 2016-09-16 ENCOUNTER — Other Ambulatory Visit: Payer: Self-pay | Admitting: Neurology

## 2016-09-16 NOTE — Telephone Encounter (Signed)
Rx printed, signed, faxed to pharmacy. 

## 2016-09-22 ENCOUNTER — Other Ambulatory Visit: Payer: Self-pay | Admitting: Student

## 2016-09-30 ENCOUNTER — Encounter: Payer: Self-pay | Admitting: Internal Medicine

## 2016-09-30 ENCOUNTER — Ambulatory Visit (INDEPENDENT_AMBULATORY_CARE_PROVIDER_SITE_OTHER): Payer: Medicare Other | Admitting: Internal Medicine

## 2016-09-30 VITALS — BP 142/86 | HR 80 | Ht 72.0 in | Wt 191.8 lb

## 2016-09-30 DIAGNOSIS — R55 Syncope and collapse: Secondary | ICD-10-CM | POA: Diagnosis not present

## 2016-09-30 DIAGNOSIS — R42 Dizziness and giddiness: Secondary | ICD-10-CM

## 2016-09-30 NOTE — Patient Instructions (Signed)
Your physician recommends that you continue on your current medications as directed. Please refer to the Current Medication list given to you today. Your physician recommends that you schedule a follow-up appointment in: April 2018 with Dr. Tenny Crawoss.

## 2016-09-30 NOTE — Progress Notes (Signed)
Cardiology Office Note   Date:  09/30/2016   ID:  Jose HoopsGary D Shankland, DOB May 11, 1956, MRN [id number]  PCP:  Almon Herculesaye T Gonfa, MD  Cardiologist:   Dietrich PatesPaula Nira Visscher, MD   F/u of dizziness, syncope    History of Present Illness: Jose Li is a 60 y.o. male with a history of dizziness, syncope I saw him earlier this summer  Since seen he has continued to have some dizzines  Mostly when stands in one position  If moves around he is OK  No syncope         Outpatient Medications Prior to Visit  Medication Sig Dispense Refill  . aspirin 81 MG EC tablet Take 81 mg by mouth daily.      Marland Kitchen. atorvastatin (LIPITOR) 40 MG tablet Take 1 tablet (40 mg total) by mouth daily. 30 tablet 11  . benztropine (COGENTIN) 1 MG tablet Take 1 mg by mouth daily.     . cetirizine (ZYRTEC) 10 MG tablet TAKE ONE TABLET BY MOUTH ONCE DAILY 30 tablet 0  . citalopram (CELEXA) 20 MG tablet Take 20 mg by mouth daily.    Marland Kitchen. ibuprofen (ADVIL,MOTRIN) 600 MG tablet TAKE ONE TABLET BY MOUTH TWICE DAILY AS NEEDED FOR HEADACHE OR  MODERATE  PAIN 30 tablet 0  . levETIRAcetam (KEPPRA) 750 MG tablet TAKE TWO TABLETS BY MOUTH TWICE DAILY. (Patient taking differently: Take 1,500 mg by mouth 2 (two) times daily. TAKE TWO TABLETS BY MOUTH TWICE DAILY.) 360 tablet 3  . LYRICA 75 MG capsule TAKE ONE CAPSULE BY MOUTH TWICE DAILY 60 capsule 5  . midodrine (PROAMATINE) 5 MG tablet Take 1 tablet by mouth at 6 am and 1 tablet 10 am and 1 tablet at 2 pm daily. (Patient taking differently: Take 5-10 mg by mouth 3 (three) times daily with meals. ) 90 tablet 11  . Multiple Vitamin (MULTIVITAMIN) tablet Take 1 tablet by mouth daily.    . pindolol (VISKEN) 5 MG tablet TAKE ONE-HALF TABLET BY MOUTH TWICE DAILY 30 tablet 9  . potassium chloride SA (KLOR-CON M20) 20 MEQ tablet Take 1 tablet (20 mEq total) by mouth daily. 30 tablet 0  . risperiDONE (RISPERDAL) 2 MG tablet Take 2 mg by mouth at bedtime.     . thiamine 100 MG tablet Take 100 mg by mouth daily.       . traZODone (DESYREL) 150 MG tablet Take 150 mg by mouth at bedtime.      No facility-administered medications prior to visit.      Allergies:   Duloxetine; Erythromycin; Hctz [hydrochlorothiazide]; and Propranolol   Past Medical History:  Diagnosis Date  . Allergy   . Anxiety   . Depression   . Hyperlipidemia   . Hypertension, essential, benign 08/02/2012  . Orthostatic hypotension 10/09/2013  . OSA (obstructive sleep apnea) 10/01/2015   Severe OSA with an AHI of 86/hr now on BiPAP at 23/19cm H2O  . Seizures (HCC)    none for last 6-407months  . Substance abuse     Past Surgical History:  Procedure Laterality Date  . FRACTURE SURGERY  1990   right hip and femur  . HERNIA REPAIR  07/2009   umbilical hernia/ got infected  . JOINT REPLACEMENT Right 1990   right hip  . TRACHEOSTOMY  08/1999   Had pneumoniaand was in a coma 10 days     Social History:  The patient  reports that he has been smoking Cigarettes.  He has a 15.00 pack-year  smoking history. He has never used smokeless tobacco. He reports that he does not drink alcohol or use drugs.   Family History:  The patient's family history includes Diabetes in his father; Heart disease in his brother and mother.    ROS:  Please see the history of present illness. All other systems are reviewed and  Negative to the above problem except as noted.    PHYSICAL EXAM: VS:  BP (!) 142/86   Pulse 80   Ht 6' (1.829 m)   Wt 191 lb 12.8 oz (87 kg)   SpO2 98%   BMI 26.01 kg/m   GEN: Well nourished, well developed, in no acute distress HEENT: normal Neck: no JVD, carotid bruits, or masses Cardiac: RRR; no murmurs, rubs, or gallops,no edema  Respiratory:  clear to auscultation bilaterally, normal work of breathing GI: soft, nontender, nondistended, + BS  No hepatomegaly  MS: no deformity Moving all extremities   Skin: warm and dry, no rash Neuro:  Strength and sensation are intact Psych: euthymic mood, full  affect   EKG:  EKG is not  ordered today.   Lipid Panel    Component Value Date/Time   CHOL 176 12/29/2015 1146   TRIG 393 (H) 12/29/2015 1146   HDL 33 (L) 12/29/2015 1146   CHOLHDL 5.3 (H) 12/29/2015 1146   VLDL 79 (H) 12/29/2015 1146   LDLCALC 64 12/29/2015 1146      Wt Readings from Last 3 Encounters:  09/30/16 191 lb 12.8 oz (87 kg)  07/07/16 187 lb (84.8 kg)  06/10/16 195 lb 9.6 oz (88.7 kg)      ASSESSMENT AND PLAN:  1  Dizziness/ syncope  BP drops immed with standing but then recovers  I have encouraged him to stay active  Dont stand in one place long   [facility]  Salt   Call if ever switch meds, esp psych meds  I will f/u next srping  Sooner if more syncope     Current medicines are reviewed at length with the patient today.  The patient does not have concerns regarding medicines.  Signed, Dietrich PatesPaula Ericha Whittingham, MD  09/30/2016 9:50 AM    [facility] [address], [place], [place]  [address] Phone: [phone](336) [phone]; Fax: [phone]) [phone]

## 2016-10-18 ENCOUNTER — Ambulatory Visit: Payer: Medicare Other | Admitting: Nurse Practitioner

## 2016-10-19 ENCOUNTER — Ambulatory Visit: Payer: Self-pay | Admitting: Nurse Practitioner

## 2016-10-19 ENCOUNTER — Encounter: Payer: Self-pay | Admitting: Nurse Practitioner

## 2016-10-19 ENCOUNTER — Ambulatory Visit (INDEPENDENT_AMBULATORY_CARE_PROVIDER_SITE_OTHER): Payer: Medicare Other | Admitting: Nurse Practitioner

## 2016-10-19 VITALS — BP 128/77 | HR 84 | Ht 72.0 in | Wt 195.2 lb

## 2016-10-19 DIAGNOSIS — G40909 Epilepsy, unspecified, not intractable, without status epilepticus: Secondary | ICD-10-CM

## 2016-10-19 DIAGNOSIS — R569 Unspecified convulsions: Secondary | ICD-10-CM

## 2016-10-19 MED ORDER — LEVETIRACETAM 750 MG PO TABS: 1500.0000 mg | ORAL_TABLET | Freq: Two times a day (BID) | ORAL | 3 refills | Status: DC

## 2016-10-19 NOTE — Patient Instructions (Signed)
Continue Keppra at current dose will refill Call for seizure activity F/U yearly and prn

## 2016-10-19 NOTE — Progress Notes (Signed)
I have read the note, and I agree with the clinical assessment and plan.  WILLIS,CHARLES KEITH   

## 2016-10-19 NOTE — Progress Notes (Signed)
[facility]  PATIENT: Jose Li DOB: 1956/09/22   REASON FOR VISIT: follow up for epilepsy, hx of orthostatic hypotension, OSA HISTORY FROM:patient   HISTORY OF PRESENT ILLNESS:Jose Li is a 60 year old right-handed white male with a history of alcohol abuse and a history of seizures. The patient also has orthostatic hypotension, treated with Midodrine. He also has obstructive sleep apnea treated with BiPAP. The patient is on Keppra taking the 750 mg tablets, 2 tablets twice daily. The patient has not had any seizures since last seen. The patient is tolerating the Keppra quite well. He has not had any blackouts associated with orthostatic hypotension. He is not operating a motor vehicle.He returns for reevaluation   REVIEW OF SYSTEMS: Full 14 system review of systems performed and notable only for those listed, all others are neg:  Constitutional: fatigue  Cardiovascular: neg Ear/Nose/Throat: neg  Skin: neg Eyes: neg Respiratory: neg Gastroitestinal: neg  Hematology/Lymphatic: neg  Endocrine: neg Musculoskeletal:neg Allergy/Immunology: neg Neurological: neg Psychiatric: depression anxiety Sleep : obstructive sleep apnea with Bipap   ALLERGIES: Allergies  Allergen Reactions  . Duloxetine Other (See Comments)    Caused hyponatremia  . Erythromycin Other (See Comments)    Unknown reaction. The patient can not remember  . Hctz [Hydrochlorothiazide]     "Makes him faint" per patient Dr. Tenny Craw ([place]) took him off  . Propranolol     "Makes him faint" per patient Dr. Tenny Craw ([place]) took him off      HOME MEDICATIONS: Outpatient Medications Prior to Visit  Medication Sig Dispense Refill  . aspirin 81 MG EC tablet Take 81 mg by mouth daily.      Marland Kitchen atorvastatin (LIPITOR) 40 MG tablet Take 1 tablet (40 mg total) by mouth daily. 30 tablet 11  . benztropine (COGENTIN) 1 MG tablet Take 1 mg by mouth daily.     . cetirizine (ZYRTEC) 10 MG tablet TAKE ONE  TABLET BY MOUTH ONCE DAILY 30 tablet 0  . citalopram (CELEXA) 20 MG tablet Take 20 mg by mouth daily.    Marland Kitchen ibuprofen (ADVIL,MOTRIN) 600 MG tablet TAKE ONE TABLET BY MOUTH TWICE DAILY AS NEEDED FOR HEADACHE OR  MODERATE  PAIN 30 tablet 0  . levETIRAcetam (KEPPRA) 750 MG tablet TAKE TWO TABLETS BY MOUTH TWICE DAILY. (Patient taking differently: Take 1,500 mg by mouth 2 (two) times daily. TAKE TWO TABLETS BY MOUTH TWICE DAILY.) 360 tablet 3  . LYRICA 75 MG capsule TAKE ONE CAPSULE BY MOUTH TWICE DAILY 60 capsule 5  . midodrine (PROAMATINE) 5 MG tablet Take 1 tablet by mouth at 6 am and 1 tablet 10 am and 1 tablet at 2 pm daily. (Patient taking differently: Take 5-10 mg by mouth 3 (three) times daily with meals. ) 90 tablet 11  . Multiple Vitamin (MULTIVITAMIN) tablet Take 1 tablet by mouth daily.    . pindolol (VISKEN) 5 MG tablet TAKE ONE-HALF TABLET BY MOUTH TWICE DAILY 30 tablet 9  . potassium chloride SA (KLOR-CON M20) 20 MEQ tablet Take 1 tablet (20 mEq total) by mouth daily. 30 tablet 0  . risperiDONE (RISPERDAL) 2 MG tablet Take 2 mg by mouth at bedtime.     . thiamine 100 MG tablet Take 100 mg by mouth daily.      . traZODone (DESYREL) 150 MG tablet Take 150 mg by mouth at bedtime.      No facility-administered medications prior to visit.     PAST MEDICAL HISTORY: Past Medical History:  Diagnosis Date  . Allergy   . Anxiety   . Depression   . Hyperlipidemia   . Hypertension, essential, benign 08/02/2012  . Orthostatic hypotension 10/09/2013  . OSA (obstructive sleep apnea) 10/01/2015   Severe OSA with an AHI of 86/hr now on BiPAP at 23/19cm H2O  . Seizures (HCC)    none for last 6-247months  . Substance abuse     PAST SURGICAL HISTORY: Past Surgical History:  Procedure Laterality Date  . FRACTURE SURGERY  1990   right hip and femur  . HERNIA REPAIR  07/2009   umbilical hernia/ got infected  . JOINT REPLACEMENT Right 1990   right hip  . TRACHEOSTOMY  08/1999   Had  pneumoniaand was in a coma 10 days    FAMILY HISTORY: Family History  Problem Relation Age of Onset  . Heart disease Mother   . Diabetes Father   . Heart disease Brother   . Hypertension Neg Hx   . Stroke Neg Hx     SOCIAL HISTORY: Social History   Social History  . Marital status: Single    Spouse name: N/A  . Number of children: N/A  . Years of education: N/A   Occupational History  . diability    Social History Main Topics  . Smoking status: Current Every Day Smoker    Packs/day: 0.50    Years: 30.00    Types: Cigarettes  . Smokeless tobacco: Never Used     Comment: Had cut back, but stress has prompted him to smoke again  . Alcohol use No     Comment: history of alcohol abuse.  . Drug use: No  . Sexual activity: Not Currently   Other Topics Concern  . Not on file   Social History Narrative   Lives with 58 year old Father, Jose Li. Watches TV for fun. 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      His only son lives with his mother in [place], and attends [employer].      PHYSICAL EXAM  Vitals:   10/19/16 1043  BP: 128/77  Pulse: 84  Weight: 195 lb 3.2 oz (88.5 kg)  Height: 6' (1.829 m)   Body mass index is 26.47 kg/m. General: The patient is alert and cooperative at the time of the examination. The patient is minimally obese. Skin: No significant peripheral edema is noted.  Neurologic Exam Mental status: The patient is oriented x 3. Cranial nerves: Facial symmetry is present. Speech is normal, no aphasia or dysarthria is noted. Extraocular movements are full. Visual fields are full. Motor: The patient has good strength in all 4 extremities.no focal weakness Sensory examination: Soft touch sensation is symmetric on the face, arms, and legs. Coordination: The patient has good finger-nose-finger and heel-to-shin bilaterally. Gait and station: The patient has a normal gait. Tandem gait is steady. Romberg is negative. No drift is seen. Reflexes: Deep tendon  reflexes are symmetric.     DIAGNOSTIC DATA (LABS, IMAGING, TESTING) - I reviewed patient records, labs, notes, testing and imaging myself where available.  Lab Results  Component Value Date   WBC 14.2 (H) 02/07/2016   HGB 11.3 (L) 02/07/2016   HCT 31.3 (L) 02/07/2016   MCV 77.7 (L) 02/07/2016   PLT 431 (H) 02/07/2016      Component Value Date/Time   NA 141 06/10/2016 1613   K 3.6 06/10/2016 1613   CL 108 06/10/2016 1613   CO2 23 06/10/2016 1613   GLUCOSE 90 06/10/2016 1613   BUN <  2 (L) 06/10/2016 1613   CREATININE 0.94 06/10/2016 1613   CALCIUM 8.4 (L) 06/10/2016 1613   PROT 6.6 02/05/2016 1811   ALBUMIN 3.4 (L) 02/05/2016 1811   AST 36 02/05/2016 1811   ALT 22 02/05/2016 1811   ALKPHOS 99 02/05/2016 1811   BILITOT 1.0 02/05/2016 1811   GFRNONAA 84 02/17/2016 1011   GFRAA >89 02/17/2016 1011   Lab Results  Component Value Date   CHOL 176 12/29/2015   HDL 33 (L) 12/29/2015   LDLCALC 64 12/29/2015   TRIG 393 (H) 12/29/2015   CHOLHDL 5.3 (H) 12/29/2015       ASSESSMENT AND PLAN  60 y.o. year old male  has a past medical history of  Substance abuse; Seizures (HCC); Hypertension, essential, benign (08/02/2012); Orthostatic hypotension (10/09/2013); and OSA (obstructive sleep apnea) (10/01/2015). Here to follow-up  Continue Keppra at current dose will refill Continue Lyrica at current dose Call for seizure activity F/U yearly and prn Nilda RiggsNancy Carolyn Axzel [place], [facility] HospitalGNP, [facility]BC, APRN  [facility] [address], [address] [place], [place] [address] [phone]

## 2016-10-21 ENCOUNTER — Other Ambulatory Visit: Payer: Self-pay | Admitting: Student

## 2016-10-29 ENCOUNTER — Other Ambulatory Visit: Payer: Self-pay | Admitting: Student

## 2016-11-02 DIAGNOSIS — H524 Presbyopia: Secondary | ICD-10-CM | POA: Diagnosis not present

## 2016-11-02 DIAGNOSIS — H5203 Hypermetropia, bilateral: Secondary | ICD-10-CM | POA: Diagnosis not present

## 2016-11-02 DIAGNOSIS — H2513 Age-related nuclear cataract, bilateral: Secondary | ICD-10-CM | POA: Diagnosis not present

## 2016-11-25 ENCOUNTER — Other Ambulatory Visit: Payer: Self-pay | Admitting: Student

## 2016-11-29 ENCOUNTER — Encounter: Payer: Self-pay | Admitting: Student

## 2016-11-29 ENCOUNTER — Ambulatory Visit (INDEPENDENT_AMBULATORY_CARE_PROVIDER_SITE_OTHER): Payer: Medicare Other | Admitting: Student

## 2016-11-29 VITALS — BP 140/84 | HR 76 | Temp 98.5°F | Ht 73.0 in | Wt 202.0 lb

## 2016-11-29 DIAGNOSIS — L602 Onychogryphosis: Secondary | ICD-10-CM

## 2016-11-29 DIAGNOSIS — S90219A Contusion of unspecified great toe with damage to nail, initial encounter: Secondary | ICD-10-CM

## 2016-11-29 DIAGNOSIS — Z0000 Encounter for general adult medical examination without abnormal findings: Secondary | ICD-10-CM

## 2016-11-29 DIAGNOSIS — I10 Essential (primary) hypertension: Secondary | ICD-10-CM

## 2016-11-29 NOTE — Progress Notes (Signed)
Subjective:     Jose Li is a 61 y.o. old male here for annual physical exam and enlarged toe nail  HPI  Annual physical: patient was recently seen in the office by RN for initial medicare annual wellness visit about 4 months ago. He did not return for complete physical exam.  Patient reports walking daily. He continues to smoke about a half pack a day for the last 30 years and is not interested in quitting right now. He reports trying multiple times in the past without success. Denies drinking alcohol or recreational drug use. Had problem with balance. He states that this has resolved with discontinuation of Cymbalta.  Patient lives with his father. He is uptodate on his immunizations.  His chronic medical conditions have been well controlled.   Enlarged and thickened toe nail: bilateral. Mainly the big toes. Reports some associated pain. This has been going on for about a year or more. No changes recently. No swelling. He likes to have referral to podiatrist for trimming. He was recommended [facility]. by his insurance.     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   has a past medical history of Allergy; Anxiety; Depression; Hyperlipidemia; Hypertension, essential, benign (08/02/2012); Orthostatic hypotension (10/09/2013); OSA (obstructive sleep apnea) (10/01/2015); Seizures (HCC); and Substance abuse.  [facility]  Family  History  Problem Relation Age of Onset  . Heart disease Mother   . Diabetes Father   . Heart disease Brother   . Hypertension Neg Hx   . Stroke Neg Hx     SH Social History  Substance Use Topics  . Smoking status: Current Every Day Smoker    Packs/day: 0.50    Years: 30.00    Types: Cigarettes  . Smokeless tobacco: Never Used     Comment: Had cut back, but stress has prompted him to smoke again  . Alcohol use No     Comment: history of alcohol abuse.     Review of Systems  Constitutional: Positive for fatigue. Negative for appetite change, fever and unexpected weight change.  HENT: Negative for trouble swallowing.   Eyes: Negative for visual disturbance.  Respiratory: Negative for cough, chest tightness and shortness of breath.   Cardiovascular: Negative for chest pain and leg swelling.  Gastrointestinal: Negative for abdominal pain, blood in stool, diarrhea and nausea.  Endocrine: Negative for cold intolerance and heat intolerance.  Genitourinary: Negative for dysuria and hematuria.  Musculoskeletal: Negative for arthralgias and myalgias.  Neurological: Positive for headaches. Negative for weakness and numbness.       History of migraine  Hematological: Negative for adenopathy. Does not bruise/bleed easily.  Psychiatric/Behavioral: Negative for dysphoric mood. The patient is not nervous/anxious.    Review of Systems Objective:   Physical Exam Vitals:   11/29/16 1443  BP: 140/84  Pulse: 76  Temp: 98.5 F (36.9 C)  TempSrc: Oral  SpO2: 96%  Weight: 202 lb (91.6 kg)  Height: 6\' 1"  (1.854 m)    GEN: appears well, no apparent distress. Head: normocephalic and atraumatic  Eyes: conjunctiva without  injection, sclera anicteric Ears: external ear and ear canal normal Nares: no rhinorrhea, congestion or erythema  Oropharynx: mmm without erythema or exudation, wears upper and lower dentures HEM: negative for cervical or periauricular lymphadenopathies CVS: RRR, nl s1  & s2, no murmurs, no edema RESP: speaks in full sentence, no IWOB, CTAB GI: BS present & normal, soft, NTND, no guarding, no rebound, no mass MSK: no focal tenderness or notable swelling SKIN: no apparent skin lesion, enlarged and hypertrophied big toe nails bilaterally.  NEURO: alert and oiented appropriately, no gross defecits  PSYCH: euthymic mood with congruent affect    Assessment & Plan:  Routine history and physical examination of adult Weight is generally stable over the last one year. BMI is 26.6. Briefly discussed about life style changes including exercise and diet. Barriers are cold weather and health literacy. Will refer to podiatrist for his onychauxis. He is uptodate on his immunizations and appropriate health screenings. He is not ready to quit smoking. He has been couselled and advised to quit.  Essential hypertension BP is at goal. Had problem with hypotension in the past. He is on midodrine now.  -BMP today  Onychauxis Ordered referral to podiatrist as recommended by his insurance. No sign of tinea pedis or cellulitis at the moment.

## 2016-11-29 NOTE — Assessment & Plan Note (Signed)
Ordered referral to podiatrist as recommended by his insurance. No sign of tinea pedis or cellulitis at the moment.

## 2016-11-29 NOTE — Assessment & Plan Note (Addendum)
Weight is generally stable over the last one year. BMI is 26.6. Briefly discussed about life style changes including exercise and diet. Barriers are cold weather and health literacy. Will refer to podiatrist for his onychauxis. He is uptodate on his immunizations and appropriate health screenings. He is not ready to quit smoking. He has been couselled and advised to quit. Problem list, medications and history reviewed

## 2016-11-29 NOTE — Assessment & Plan Note (Signed)
BP is at goal. Had problem with hypotension in the past. He is on midodrine now.  -BMP today

## 2016-11-29 NOTE — Patient Instructions (Signed)
It was great seeing you today! We have addressed the following issues today  1. Toenails: I have sent a referral to podiatry, specifically to the place you requested. Someone will get in touch with you over the next couple of weeks.    If we did any lab work today, and the results require attention, either me or my nurse will get in touch with you. If everything is normal, you will get a letter in mail. If you don't hear from us in two weeks, please give us a call. Otherwise, we look forward to seeing you again at your next visit. If you have any questions or concerns before then, please call the clinic at [phone].   Please bring all your medications to every doctors visit   Sign up for My Chart to have easy access to your labs results, and communication with your Primary care physician.     Please check-out at the front desk before leaving the clinic.   Portion Size    Choose healthier foods such as 100% whole grains, vegetables, fruits, beans, nut seeds, olive oil, most vegetable oils, fat-free dietary, wild game and fish.   Avoid sweet tea, other sweetened beverages, soda, fruit juice, cold cereal and milk and trans fat.   Eat at least 3 meals and 1-2 snacks per day.  Aim for no more than 5 hours between eating.  Eat breakfast within one hour of getting up.    Exercise at least 150 minutes per week, including weight resistance exercises 3 or 4 times per week.   Try to lose at least 7-10% of your current body weight.

## 2016-11-30 DIAGNOSIS — G4733 Obstructive sleep apnea (adult) (pediatric): Secondary | ICD-10-CM | POA: Diagnosis not present

## 2016-12-03 ENCOUNTER — Other Ambulatory Visit: Payer: Self-pay | Admitting: Student

## 2016-12-03 MED ORDER — ATORVASTATIN CALCIUM 40 MG PO TABS: 40.0000 mg | ORAL_TABLET | Freq: Every day | ORAL | 11 refills | Status: DC

## 2016-12-03 NOTE — Telephone Encounter (Signed)
Pt needs a refill on Lipitor. ep

## 2016-12-23 ENCOUNTER — Other Ambulatory Visit: Payer: Self-pay | Admitting: Student

## 2016-12-23 DIAGNOSIS — B351 Tinea unguium: Secondary | ICD-10-CM | POA: Diagnosis not present

## 2016-12-23 DIAGNOSIS — M79672 Pain in left foot: Secondary | ICD-10-CM | POA: Diagnosis not present

## 2016-12-23 DIAGNOSIS — M71372 Other bursal cyst, left ankle and foot: Secondary | ICD-10-CM | POA: Diagnosis not present

## 2016-12-23 DIAGNOSIS — M79671 Pain in right foot: Secondary | ICD-10-CM | POA: Diagnosis not present

## 2016-12-24 ENCOUNTER — Telehealth: Payer: Self-pay | Admitting: Internal Medicine

## 2016-12-24 NOTE — Telephone Encounter (Signed)
Pt and Father calling to make Dr Tenny Crawoss aware of a new medication the pts Foot Doctor wants to start him on.  Father states that the pts Foot MD wants to start him on a new medication called Lamisil, for ingrown toenails and fungus.  Per the Father, he states he runs all new med changes by Dr Tenny Crawoss first, before the pt proceeds with using it.  Informed the pts Father that both Dr Tenny Crawoss and RN are out of the office today, but I will route this message to them both for further review, recommendation, and follow-up with the pt and Father thereafter.  Father verbalized understanding and agrees with this plan.

## 2016-12-24 NOTE — Telephone Encounter (Signed)
Lamisil should be OK  Need to follow LFTs

## 2016-12-24 NOTE — Telephone Encounter (Signed)
[name] is calling to find out if its ok for Jose Li (son) to take Lamacil 250mg . He is having some problems with his foot. Please call .Marland Kitchen. Thanks

## 2016-12-27 ENCOUNTER — Encounter: Payer: Self-pay | Admitting: Student

## 2016-12-27 NOTE — Telephone Encounter (Signed)
Follow up    Pt calling to follow up about medication changes.

## 2016-12-27 NOTE — Telephone Encounter (Signed)
Advised father that Dr. Tenny Crawoss said Lamisil should be ok and LFTs need to be followed.  Now father wants to let Dr. Tenny Crawoss know that pt is taking 2 tabs once daily of the Cogentin 1mg  and get approval for medication change.  Will route to Dr. Tenny Crawoss for review.

## 2016-12-27 NOTE — Telephone Encounter (Signed)
New Message    Pt is now taking 2 tablets of this medication also  benztropine (COGENTIN) 1 MG tablet Take 1 mg by mouth daily.

## 2016-12-28 DIAGNOSIS — I890 Lymphedema, not elsewhere classified: Secondary | ICD-10-CM | POA: Diagnosis not present

## 2016-12-28 DIAGNOSIS — B351 Tinea unguium: Secondary | ICD-10-CM | POA: Diagnosis not present

## 2016-12-28 DIAGNOSIS — M79609 Pain in unspecified limb: Secondary | ICD-10-CM | POA: Diagnosis not present

## 2016-12-28 NOTE — Telephone Encounter (Signed)
I would be very careful  Can cause dizziness He could try going up to 1 1/2 tabs day  Watch symptoms for awhile

## 2016-12-28 NOTE — Telephone Encounter (Signed)
Follow up    Pt father is calling regarding medication changes.

## 2016-12-28 NOTE — Telephone Encounter (Signed)
Patient's father is calling again regarding Dr. Tenny Crawoss' approval for taking 2 tablets once daily of Cogentin 1 mg. Patient's father notified again that the message has been routed to Dr. Tenny Crawoss and we are still waiting approval. Patient's father verbalized understanding again.

## 2016-12-28 NOTE — Telephone Encounter (Signed)
Patient's father is calling regarding Dr. Tenny Crawoss' approval for taking 2 tablets once daily of Cogentin 1 mg. Patient's father notified that message has been routed to Dr. Tenny Crawoss and we are still waiting approval. Patient's father verbalized understanding.

## 2016-12-28 NOTE — Telephone Encounter (Signed)
Follow Up:; ° ° °Returning your call. °

## 2016-12-29 NOTE — Telephone Encounter (Signed)
Spoke with patient's father.  Advised of Dr. Charlott Rakesoss's recommendation due to patient's history of falling and passing out multiple times.   Advised they should contact patient's psychiatrist to let that dr know about Dr. Charlott Rakesoss's recommendations.  Advised to monitor for any dizziness, lightheadedness and if none after few weeks then if needed they may want to discuss increasing to 2 tablets of Cogentin 1 mg daily at that time if needed.  [name] thanked me for the call.

## 2016-12-30 ENCOUNTER — Telehealth: Payer: Self-pay | Admitting: Internal Medicine

## 2016-12-30 IMAGING — CR DG CHEST 2V
2 series · 2 of 2 positions shown · non-contrast
Comparison: Chest CT 01/28/2016 and chest x-ray 01/27/2016.

CLINICAL DATA: History of recent subdural hematoma after a fall.
Altered mental status.

EXAM:
CHEST  2 VIEW

[chest pa]
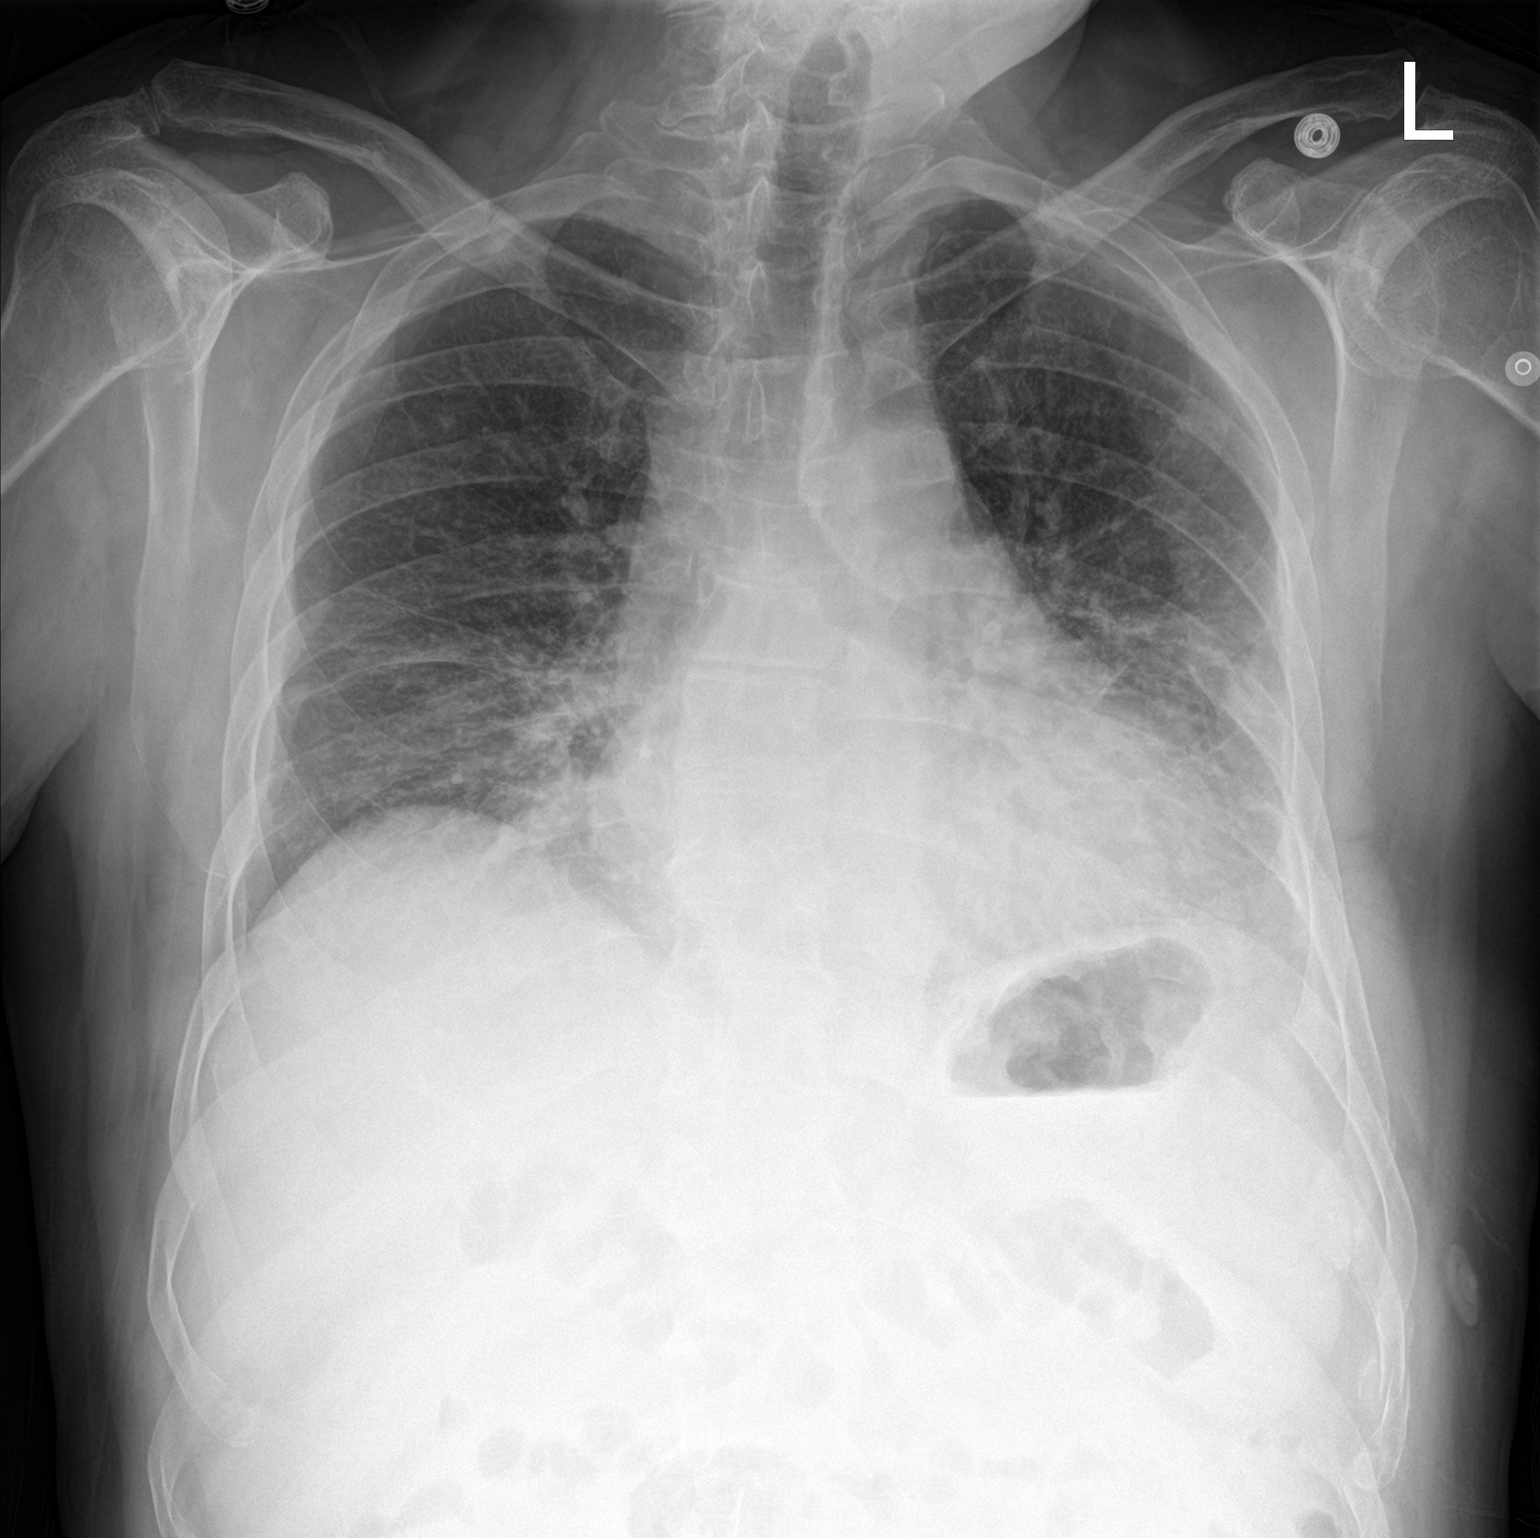

[chest lat]
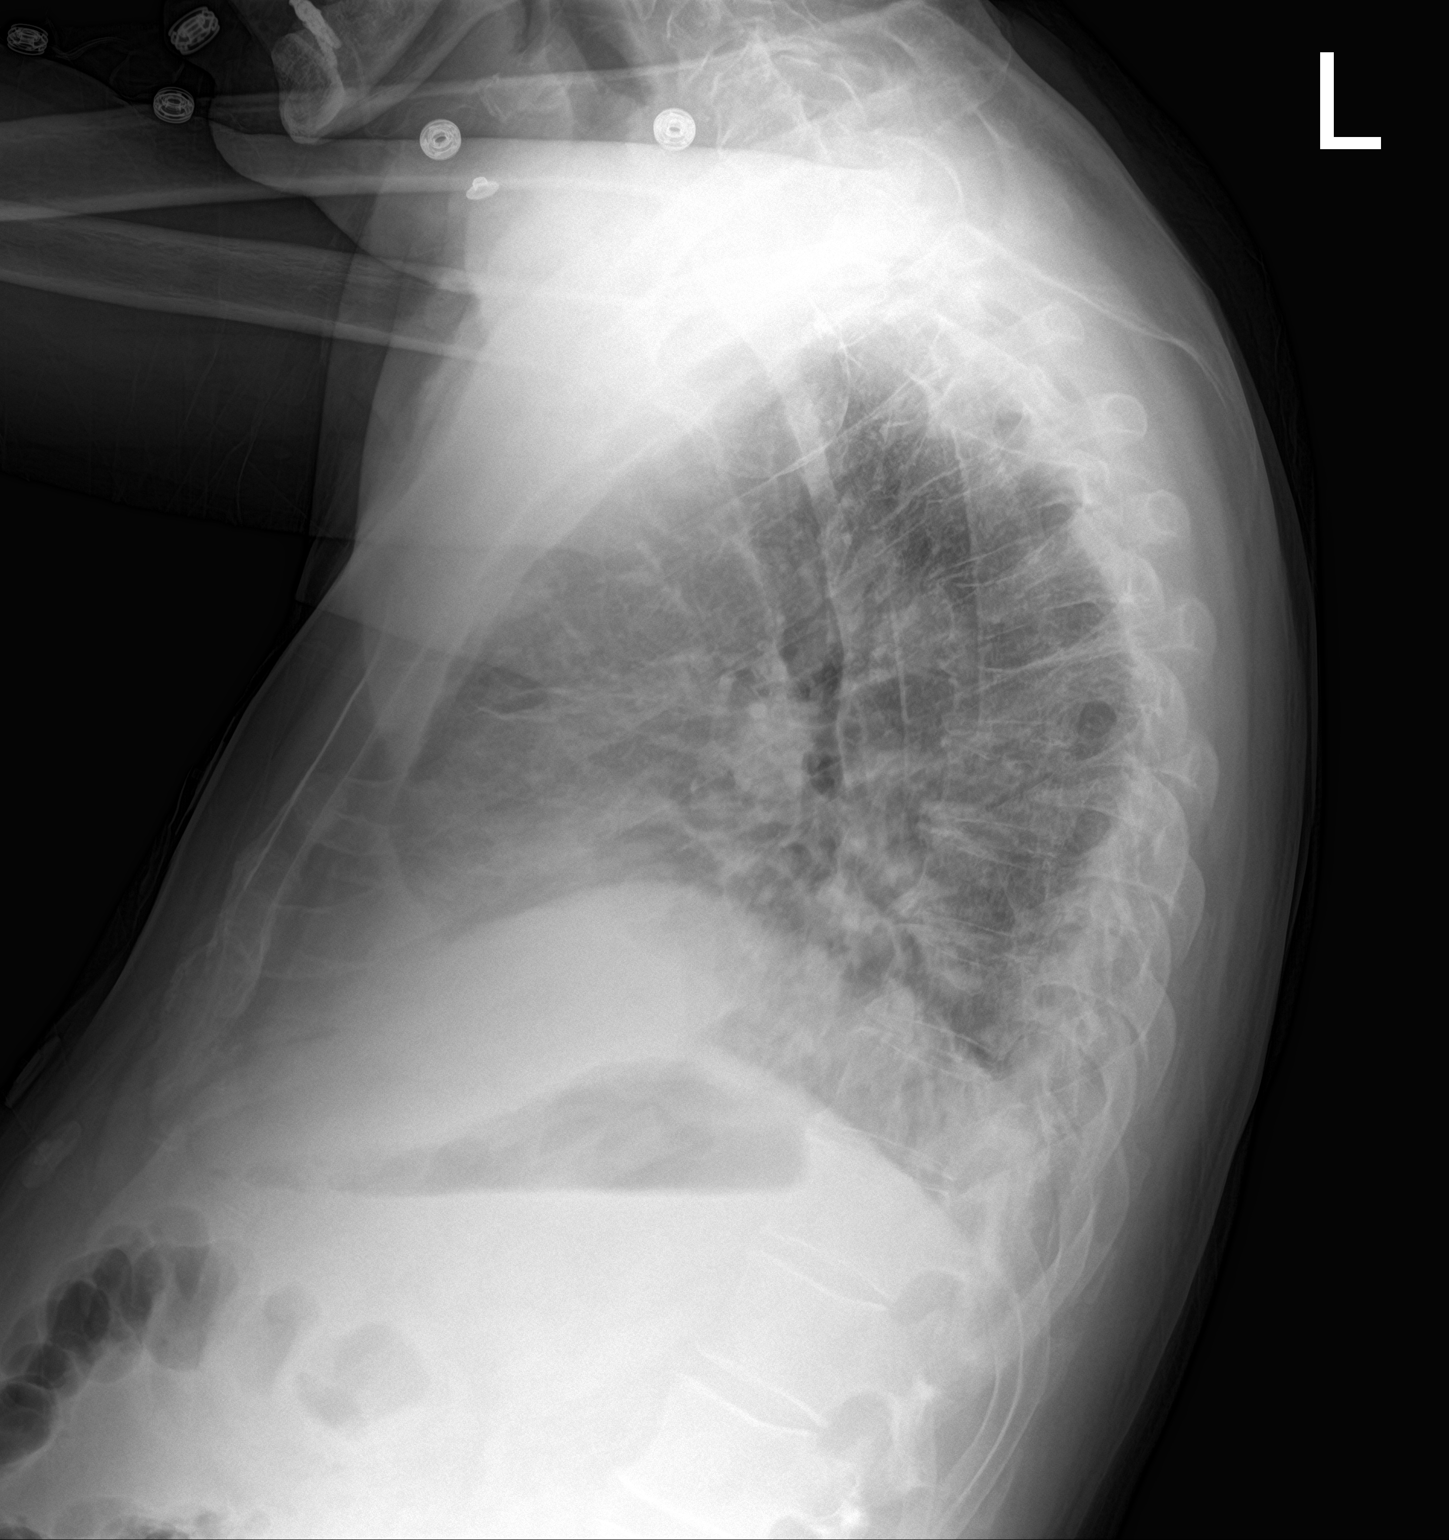

[2 of 2 positions shown; findings below may reference images not displayed]

FINDINGS: The heart is borderline enlarged. Low lung volumes with vascular
crowding and streaky bibasilar atelectasis. There is also vascular
congestion and possible mild interstitial edema. No definite pleural
effusions. No pneumothorax.

Healed and acute left rib fractures are again demonstrated.
IMPRESSION: Low lung volumes with vascular crowding and bibasilar atelectasis.

Vascular congestion and possible mild interstitial edema.

Healed and acute left rib fractures again demonstrated.

## 2016-12-30 IMAGING — CR DG ABDOMEN 1V
1 series · 1 of 1 positions shown · non-contrast
Comparison: CT dated 01/28/2016

CLINICAL DATA: 60-year-old male with abdominal pain

EXAM:
ABDOMEN - 1 VIEW

[abdomen kub]
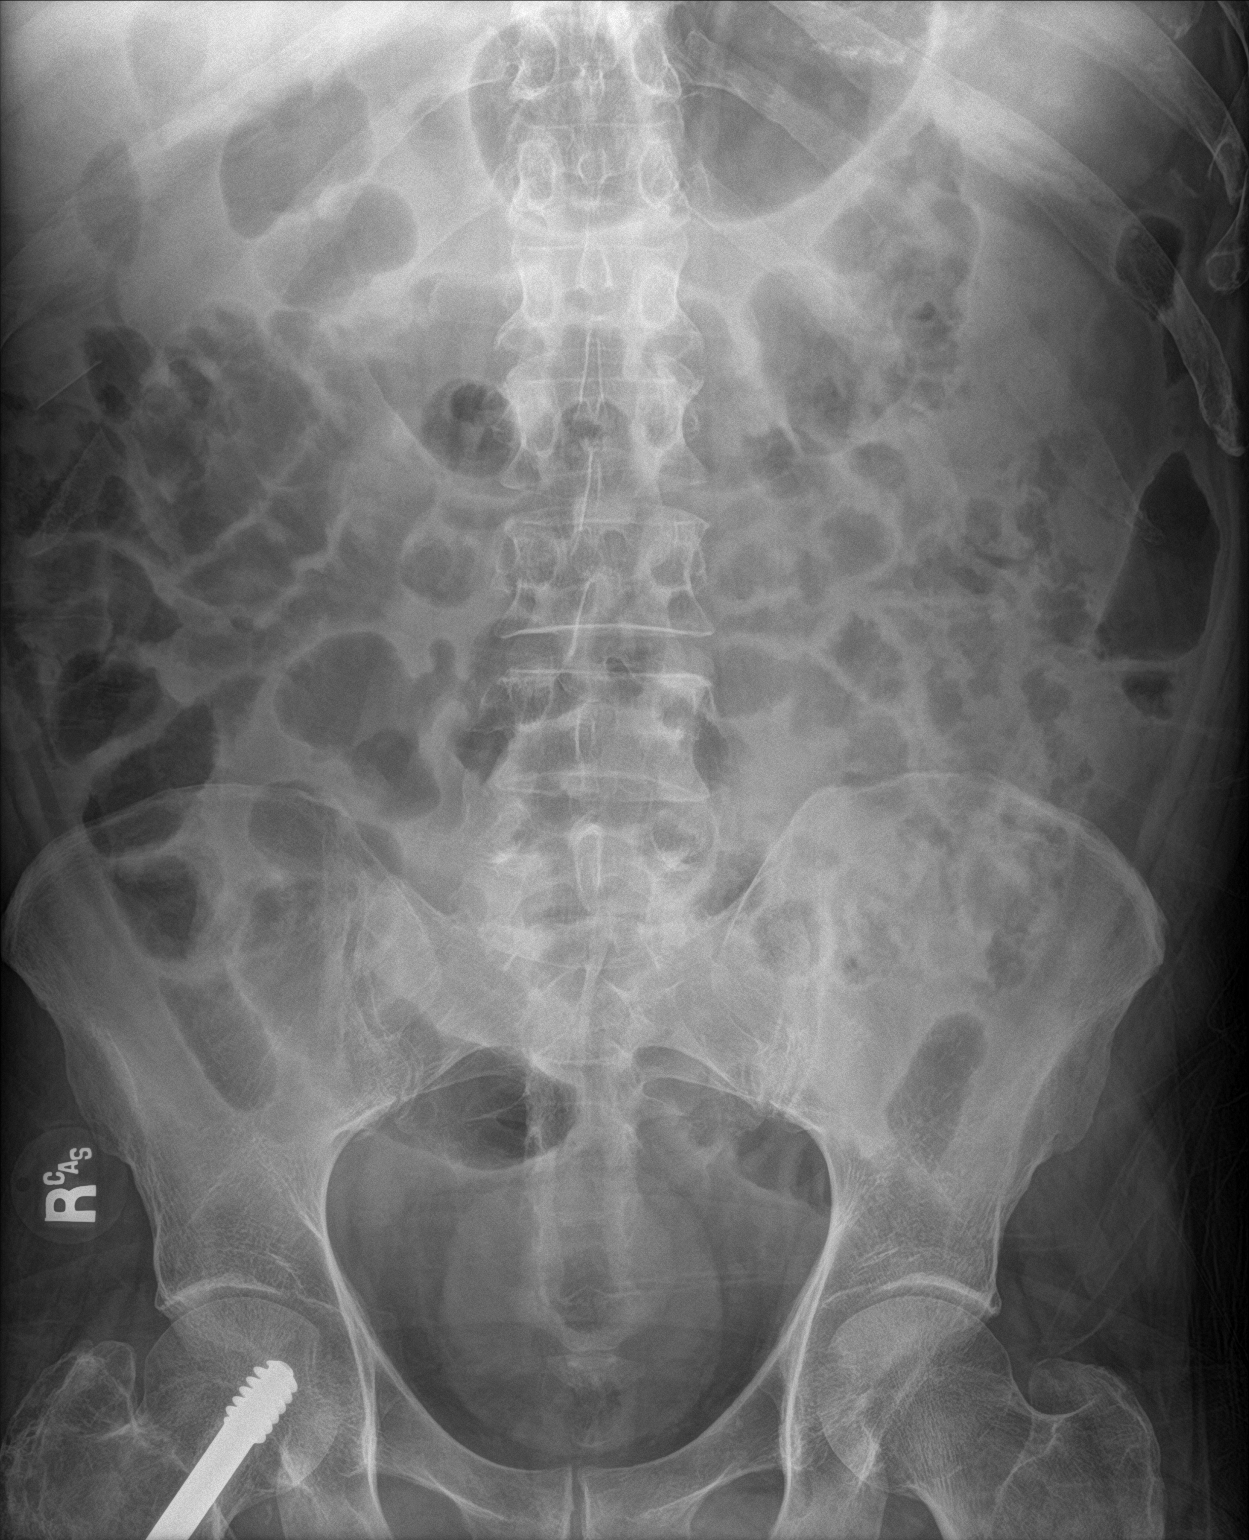

[1 of 1 positions shown; findings below may reference images not displayed]

FINDINGS: There is no evidence of bowel obstruction for free air. No
radiopaque calculi identified. The soft tissues are grossly
unremarkable with there is osteopenia with degenerative changes of
the spine. Partially visualized right femoral intra medullary
hardware. Stop no acute fracture.
IMPRESSION: Negative.

## 2016-12-30 NOTE — Telephone Encounter (Signed)
Medication list again updated to show no change was made to benztrpine.  Pt continues on 1 mg daily.

## 2016-12-30 NOTE — Telephone Encounter (Signed)
Mr. Jose EmmsGordon Griess (patient father) is calling to inform you that there will no be any changes to his medication. No followup with Mr. Rick DuffGordan Sinyard is necessary. Thanks.

## 2017-01-13 LAB — BASIC METABOLIC PANEL
BUN/Creatinine Ratio: 4 — ABNORMAL LOW (ref 9–20)
BUN: 4 mg/dL — AB (ref 6–24)
CALCIUM: 9.4 mg/dL (ref 8.7–10.2)
CO2: 23 mmol/L (ref 18–29)
Chloride: 101 mmol/L (ref 96–106)
Creatinine, Ser: 1.08 mg/dL (ref 0.76–1.27)
GFR calc Af Amer: 87 mL/min/{1.73_m2} (ref >59)
GFR calc non Af Amer: 75 mL/min/{1.73_m2} (ref >59)
GLUCOSE: 84 mg/dL (ref 65–99)
Potassium: 4.5 mmol/L (ref 3.5–5.2)
Sodium: 140 mmol/L (ref 134–144)

## 2017-01-14 ENCOUNTER — Encounter: Payer: Self-pay | Admitting: Vascular Surgery

## 2017-01-20 ENCOUNTER — Other Ambulatory Visit: Payer: Self-pay | Admitting: Student

## 2017-01-20 ENCOUNTER — Other Ambulatory Visit: Payer: Self-pay | Admitting: Internal Medicine

## 2017-01-20 DIAGNOSIS — B351 Tinea unguium: Secondary | ICD-10-CM | POA: Diagnosis not present

## 2017-01-20 DIAGNOSIS — M79671 Pain in right foot: Secondary | ICD-10-CM | POA: Diagnosis not present

## 2017-01-20 DIAGNOSIS — M79672 Pain in left foot: Secondary | ICD-10-CM | POA: Diagnosis not present

## 2017-01-24 ENCOUNTER — Ambulatory Visit (INDEPENDENT_AMBULATORY_CARE_PROVIDER_SITE_OTHER): Payer: Medicare Other | Admitting: Vascular Surgery

## 2017-01-24 ENCOUNTER — Telehealth: Payer: Self-pay | Admitting: Vascular Surgery

## 2017-01-24 ENCOUNTER — Encounter: Payer: Self-pay | Admitting: Vascular Surgery

## 2017-01-24 VITALS — BP 119/73 | HR 75 | Temp 97.3°F | Resp 20 | Ht 73.0 in | Wt 196.5 lb

## 2017-01-24 DIAGNOSIS — I83891 Varicose veins of right lower extremities with other complications: Secondary | ICD-10-CM | POA: Insufficient documentation

## 2017-01-24 NOTE — Progress Notes (Signed)
Subjective:     Patient ID: Blossom HoopsGary D Hafner, male   DOB: 10-08-1956, 61 y.o.   MRN: [id number]  HPI This 61 year old male was referred by Dr. Illene BolusJason Ziegler for evaluation of painful varicosities in the right leg with swelling. He has a remote history of a DVT in the right leg and did take Coumadin for 6 months but is now on aspirin only. He has been having significant swelling in the right leg which is worsened recently. He occasionally wears short leg elastic compression stockings which don't help much. He does not elevate his legs. He has no history of stasis ulcers bleeding or superficial thrombophlebitis. He does have aching throbbing and burning discomfort in his right thigh and calf which worsens as the day progresses the more he is standing. He has no symptoms in the contralateral left leg.  Past Medical History:  Diagnosis Date  . Allergy   . Anemia   . Anxiety   . Depression   . Hyperlipidemia   . Hypertension, essential, benign 08/02/2012  . Orthostatic hypotension 10/09/2013  . OSA (obstructive sleep apnea) 10/01/2015   Severe OSA with an AHI of 86/hr now on BiPAP at 23/19cm H2O  . Seizures (HCC)    none for last 6-427months  . Substance abuse     Social History  Substance Use Topics  . Smoking status: Current Every Day Smoker    Packs/day: 0.50    Years: 30.00    Types: Cigarettes  . Smokeless tobacco: Never Used     Comment: Had cut back, but stress has prompted him to smoke again  . Alcohol use No     Comment: history of alcohol abuse.    Family History  Problem Relation Age of Onset  . Heart disease Mother   . Diabetes Father   . Heart disease Brother   . Hypertension Neg Hx   . Stroke Neg Hx     Allergies  Allergen Reactions  . Duloxetine Other (See Comments)    Caused hyponatremia  . Erythromycin Other (See Comments)    Unknown reaction. The patient can not remember  . Hctz [Hydrochlorothiazide]     "Makes him faint" per patient Dr. Tenny Crawoss ([place]) took him  off  . Propranolol     "Makes him faint" per patient Dr. Tenny Crawoss ([place]) took him off       Current Outpatient Prescriptions:  .  aspirin 81 MG EC tablet, Take 81 mg by mouth daily.  , Disp: , Rfl:  .  atorvastatin (LIPITOR) 40 MG tablet, Take 1 tablet (40 mg total) by mouth daily., Disp: 30 tablet, Rfl: 11 .  benztropine (COGENTIN) 1 MG tablet, Take 1 mg by mouth daily. , Disp: , Rfl:  .  cetirizine (ZYRTEC) 10 MG tablet, TAKE 1 TABLET BY MOUTH ONCE DAILY, Disp: 30 tablet, Rfl: 0 .  citalopram (CELEXA) 20 MG tablet, Take 20 mg by mouth daily., Disp: , Rfl:  .  ibuprofen (ADVIL,MOTRIN) 600 MG tablet, TAKE ONE TABLET BY MOUTH TWICE DAILY AS NEEDED FOR HEADACHE OR  MODERATE  PAIN, Disp: 30 tablet, Rfl: 0 .  levETIRAcetam (KEPPRA) 750 MG tablet, Take 2 tablets (1,500 mg total) by mouth 2 (two) times daily. TAKE TWO TABLETS BY MOUTH TWICE DAILY., Disp: 360 tablet, Rfl: 3 .  LYRICA 75 MG capsule, TAKE ONE CAPSULE BY MOUTH TWICE DAILY, Disp: 60 capsule, Rfl: 5 .  midodrine (PROAMATINE) 5 MG tablet, TAKE 1 TABLET BY MOUTH AT 6 AM, THEN TAKE 1  TABLET AT 10 AM, AND 1 TABLET AT 2PM DAILY, Disp: 270 tablet, Rfl: 2 .  Multiple Vitamin (MULTIVITAMIN) tablet, Take 1 tablet by mouth daily., Disp: , Rfl:  .  pindolol (VISKEN) 5 MG tablet, TAKE ONE-HALF TABLET BY MOUTH TWICE DAILY, Disp: 30 tablet, Rfl: 9 .  potassium chloride SA (KLOR-CON M20) 20 MEQ tablet, Take 1 tablet (20 mEq total) by mouth daily., Disp: 30 tablet, Rfl: 0 .  risperiDONE (RISPERDAL) 2 MG tablet, Take 2 mg by mouth at bedtime. , Disp: , Rfl:  .  terbinafine (LAMISIL) 250 MG tablet, , Disp: , Rfl:  .  thiamine 100 MG tablet, Take 100 mg by mouth daily.  , Disp: , Rfl:  .  traZODone (DESYREL) 150 MG tablet, Take 150 mg by mouth at bedtime. , Disp: , Rfl:   Vitals:   01/24/17 1452  BP: 119/73  Pulse: 75  Resp: 20  Temp: 97.3 F (36.3 C)  TempSrc: Oral  SpO2: 98%  Weight: 196 lb 8 oz (89.1 kg)  Height: 6\' 1"  (1.854 m)    Body  mass index is 25.93 kg/m.         Review of Systems Denies chest pain, dyspnea on exertion, PND, orthopnea, hemoptysis. Has had a history of temporary loss of vision in one eye many many years ago with no recurrence. Occasionally has dizziness. Has history of depression.    Objective:   Physical Exam BP 119/73 (BP Location: Left Arm, Patient Position: Sitting, Cuff Size: Normal)   Pulse 75   Temp 97.3 F (36.3 C) (Oral)   Resp 20   Ht 6\' 1"  (1.854 m)   Wt 196 lb 8 oz (89.1 kg)   SpO2 98%   BMI 25.93 kg/m     Gen.-alert and oriented x3 in no apparent distress HEENT normal for age Lungs no rhonchi or wheezing Cardiovascular regular rhythm no murmurs carotid pulses 3+ palpable no bruits audible Abdomen soft nontender no palpable masses Musculoskeletal free of  major deformities Skin clear -no rashes Neurologic normal Lower extremities 3+ femoral and dorsalis pedis pulses palpable bilaterally with no edema on the left 1-2+ edema on the right beginning in distal thigh extending to foot. Bulging varicosities in right anterior thigh extending up inguinal crease as well as right medial calf below the knee over great saphenous vein. Reticular and spider veins involving the lower leg down to the ankle level. No hyperpigmentation or active ulceration noted. No varicosities noted in left leg  Today Performed a bedside sono site ultrasound exam which reveals an enlarged right great saphenous vein supplying these painful varicosities with gross reflux.       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 And then to be evaluated for stab phlebectomy of residual varicosities Return in 3 months

## 2017-01-24 NOTE — Telephone Encounter (Signed)
Per [employer] instructions, I spoke with patient to confirm his appt for today and also asked him to arrive at 2:50pm. So that Dr.Lawson can see him earlier due to JDL's mtg at 4:15pm today.  awt

## 2017-02-09 ENCOUNTER — Other Ambulatory Visit: Payer: Self-pay | Admitting: Internal Medicine

## 2017-02-16 ENCOUNTER — Other Ambulatory Visit: Payer: Self-pay | Admitting: Student

## 2017-02-17 DIAGNOSIS — M79672 Pain in left foot: Secondary | ICD-10-CM | POA: Diagnosis not present

## 2017-02-17 DIAGNOSIS — M79671 Pain in right foot: Secondary | ICD-10-CM | POA: Diagnosis not present

## 2017-02-17 DIAGNOSIS — B351 Tinea unguium: Secondary | ICD-10-CM | POA: Diagnosis not present

## 2017-02-23 NOTE — Progress Notes (Signed)
Cardiology Office Note   Date:  02/25/2017   ID:  Jose Li, DOB 05/23/1956, MRN [id number]  PCP:  Almon Hercules, MD  Cardiologist:   Dietrich Pates, MD   F/U of dizziness, orthostatic intolerance    History of Present Illness: Jose Li is a 61 y.o. male with a history of dizziness, syncope, autonomic dysfuncton   I saw him in November 2017     SInce seen he has done well  No signif dizzinss  No Syncope  Breathing is OK   Says he is drinking ample fluids   No recent change in meds      Current Meds  Medication Sig  . aspirin 81 MG EC tablet Take 81 mg by mouth daily.    Marland Kitchen atorvastatin (LIPITOR) 40 MG tablet Take 1 tablet (40 mg total) by mouth daily.  . benztropine (COGENTIN) 1 MG tablet Take 1 mg by mouth daily.   . cetirizine (ZYRTEC) 10 MG tablet TAKE 1 TABLET BY MOUTH ONCE DAILY  . citalopram (CELEXA) 20 MG tablet Take 20 mg by mouth daily.  Marland Kitchen ibuprofen (ADVIL,MOTRIN) 600 MG tablet TAKE ONE TABLET BY MOUTH TWICE DAILY AS NEEDED FOR HEADACHE OR  MODERATE  PAIN  . KLOR-CON M20 20 MEQ tablet TAKE 2 TABLETS BY MOUTH IN THE MORNING AND TAKE 1 TABLET IN THE EVENING  . levETIRAcetam (KEPPRA) 750 MG tablet Take 2 tablets (1,500 mg total) by mouth 2 (two) times daily. TAKE TWO TABLETS BY MOUTH TWICE DAILY.  Marland Kitchen LYRICA 75 MG capsule TAKE ONE CAPSULE BY MOUTH TWICE DAILY  . midodrine (PROAMATINE) 5 MG tablet TAKE 1 TABLET BY MOUTH AT 6 AM, THEN TAKE 1 TABLET AT 10 AM, AND 1 TABLET AT 2PM DAILY  . Multiple Vitamin (MULTIVITAMIN) tablet Take 1 tablet by mouth daily.  . pindolol (VISKEN) 5 MG tablet TAKE ONE-HALF TABLET BY MOUTH TWICE DAILY  . risperiDONE (RISPERDAL) 2 MG tablet Take 2 mg by mouth at bedtime.   . terbinafine (LAMISIL) 250 MG tablet   . thiamine 100 MG tablet Take 100 mg by mouth daily.    . traZODone (DESYREL) 150 MG tablet Take 150 mg by mouth at bedtime.      Allergies:   Duloxetine; Erythromycin; Hctz [hydrochlorothiazide]; and Propranolol   Past Medical  History:  Diagnosis Date  . Allergy   . Anemia   . Anxiety   . Depression   . Hyperlipidemia   . Hypertension, essential, benign 08/02/2012  . Orthostatic hypotension 10/09/2013  . OSA (obstructive sleep apnea) 10/01/2015   Severe OSA with an AHI of 86/hr now on BiPAP at 23/19cm H2O  . Seizures (HCC)    none for last 6-14months  . Substance abuse     Past Surgical History:  Procedure Laterality Date  . FRACTURE SURGERY  1990   right hip and femur  . HERNIA REPAIR  07/2009   umbilical hernia/ got infected  . JOINT REPLACEMENT Right 1990   right hip  . TRACHEOSTOMY  08/1999   Had pneumoniaand was in a coma 10 days     Social History:  The patient  reports that he has been smoking Cigarettes.  He has a 15.00 pack-year smoking history. He has never used smokeless tobacco. He reports that he does not drink alcohol or use drugs.   Family History:  The patient's family history includes Diabetes in his father; Heart disease in his brother and mother.    ROS:  Please see  the history of present illness. All other systems are reviewed and  Negative to the above problem except as noted.    PHYSICAL EXAM: VS:  BP 122/68   Pulse 79   Ht  (1.854 m)   Wt 193 lb 6.4 oz (87.7 kg)   BMI 25.52 kg/m   GEN: Well nourished, well developed, in no acute distress  HEENT: normal  Neck: no JVD, carotid bruits, or masses Cardiac: RRR; no murmurs, rubs, or gallops,no edema Varicoasities in legs   Respiratory:  clear to auscultation bilaterally, normal work of breathing GI: soft, nontender, nondistended, + BS  No hepatomegaly  MS: no deformity Moving all extremities   Skin: warm and dry, no rash Neuro:  Strength and sensation are intact Psych: euthymic mood, full affect   EKG:  EKG is ordered today.  SR 79 bpm     Lipid Panel    Component Value Date/Time   CHOL 176 12/29/2015 1146   TRIG 393 (H) 12/29/2015 1146   HDL 33 (L) 12/29/2015 1146   CHOLHDL 5.3 (H) 12/29/2015 1146   VLDL  79 (H) 12/29/2015 1146   LDLCALC 64 12/29/2015 1146      Wt Readings from Last 3 Encounters:  02/25/17 193 lb 6.4 oz (87.7 kg)  01/24/17 196 lb 8 oz (89.1 kg)  11/29/16 202 lb (91.6 kg)      ASSESSMENT AND PLAN: 1  Autonomic dysfunction  Doing good on current regimen  I am reluctant to changemeds  Would continue  F/U in fall Check BMET and CBC today  2  HL  Check lpids today  Trig have been elevated in past  Sty active      Current medicines are reviewed at length with the patient today.  The patient does not have concerns regarding medicines.  Signed, Dietrich Pates, MD  02/25/2017 11:13 AM    [facility] [address] [place], [place], [place]  [address] Phone: [phone]; Fax: [phone]

## 2017-02-25 ENCOUNTER — Ambulatory Visit (INDEPENDENT_AMBULATORY_CARE_PROVIDER_SITE_OTHER): Payer: Medicare Other | Admitting: Internal Medicine

## 2017-02-25 ENCOUNTER — Encounter: Payer: Self-pay | Admitting: Internal Medicine

## 2017-02-25 VITALS — BP 122/68 | HR 79 | Ht 73.0 in | Wt 193.4 lb

## 2017-02-25 DIAGNOSIS — E782 Mixed hyperlipidemia: Secondary | ICD-10-CM | POA: Diagnosis not present

## 2017-02-25 DIAGNOSIS — I951 Orthostatic hypotension: Secondary | ICD-10-CM | POA: Diagnosis not present

## 2017-02-25 LAB — CBC
HEMOGLOBIN: 13.1 g/dL (ref 13.0–17.7)
Hematocrit: 38.9 % (ref 37.5–51.0)
MCH: 27.2 pg (ref 26.6–33.0)
MCHC: 33.7 g/dL (ref 31.5–35.7)
MCV: 81 fL (ref 79–97)
PLATELETS: 332 10*3/uL (ref 150–379)
RBC: 4.81 x10E6/uL (ref 4.14–5.80)
RDW: 13.8 % (ref 12.3–15.4)
WBC: 10.1 10*3/uL (ref 3.4–10.8)

## 2017-02-25 LAB — LIPID PANEL
Chol/HDL Ratio: 5.3 ratio — ABNORMAL HIGH (ref 0.0–5.0)
Cholesterol, Total: 138 mg/dL (ref 100–199)
HDL: 26 mg/dL — AB (ref >39)
LDL CALC: 67 mg/dL (ref 0–99)
TRIGLYCERIDES: 223 mg/dL — AB (ref 0–149)
VLDL CHOLESTEROL CAL: 45 mg/dL — AB (ref 5–40)

## 2017-02-25 LAB — BASIC METABOLIC PANEL
BUN / CREAT RATIO: 3 — AB (ref 9–20)
BUN: 3 mg/dL — AB (ref 6–24)
CALCIUM: 9.3 mg/dL (ref 8.7–10.2)
CHLORIDE: 103 mmol/L (ref 96–106)
CO2: 24 mmol/L (ref 18–29)
CREATININE: 1.14 mg/dL (ref 0.76–1.27)
GFR calc Af Amer: 81 mL/min/{1.73_m2} (ref >59)
GFR calc non Af Amer: 70 mL/min/{1.73_m2} (ref >59)
GLUCOSE: 71 mg/dL (ref 65–99)
Potassium: 4.1 mmol/L (ref 3.5–5.2)
Sodium: 141 mmol/L (ref 134–144)

## 2017-02-25 NOTE — Patient Instructions (Signed)
Your physician recommends that you continue on your current medications as directed. Please refer to the Current Medication list given to you today.  Your physician recommends that you return for lab work in: today. (bmet, cbc, lipids)  Your physician wants you to follow-up in: 6 months with Dr. Tenny Craw.  You will receive a reminder letter in the mail two months in advance. If you don't receive a letter, please call our office to schedule the follow-up appointment.

## 2017-03-17 ENCOUNTER — Other Ambulatory Visit: Payer: Self-pay | Admitting: Neurology

## 2017-03-17 DIAGNOSIS — M79671 Pain in right foot: Secondary | ICD-10-CM | POA: Diagnosis not present

## 2017-03-17 DIAGNOSIS — B351 Tinea unguium: Secondary | ICD-10-CM | POA: Diagnosis not present

## 2017-03-17 DIAGNOSIS — M79672 Pain in left foot: Secondary | ICD-10-CM | POA: Diagnosis not present

## 2017-03-18 NOTE — Telephone Encounter (Signed)
Faxed printed/signed rx lyrica to pt pharmacy. Fax: [phone]. Received confirmation.  

## 2017-03-22 DIAGNOSIS — M79609 Pain in unspecified limb: Secondary | ICD-10-CM | POA: Diagnosis not present

## 2017-03-22 DIAGNOSIS — B351 Tinea unguium: Secondary | ICD-10-CM | POA: Diagnosis not present

## 2017-03-23 ENCOUNTER — Other Ambulatory Visit: Payer: Self-pay | Admitting: Student

## 2017-03-28 NOTE — Progress Notes (Signed)
Cardiology Office Note    Date:  03/29/2017   ID:  Jose Li, DOB 05/16/1956, MRN [id number]  PCP:  Almon Hercules, MD  Cardiologist:  Armanda Magic, MD   Chief Complaint  Patient presents with  . Sleep Apnea  . Hypertension    History of Present Illness:  Jose Li is a 61 y.o. male history of severe OSA with an AHI of 86/hr. He is on  BiPAP at 23/19cm H2O. He now presents for followup of BiPAP He says that he has not been using his BIPAP recently because he forgets to put it on even though it is beside his bed. He uses a full face mask which he tolerates well. He feels the pressure is adequate. He has some problems with mouth dryness despite using the humidifier but only occasionally. He continues to feel rested in the am with minimal daytime fatigue. He sleeps on his side and does not think that he snores when [place] is using it.   Past Medical History:  Diagnosis Date  . Allergy   . Anemia   . Anxiety   . Depression   . Hyperlipidemia   . Hypertension, essential, benign 08/02/2012  . Orthostatic hypotension 10/09/2013  . OSA (obstructive sleep apnea) 10/01/2015   Severe OSA with an AHI of 86/hr now on BiPAP at 23/19cm H2O  . Seizures (HCC)    none for last 6-3months  . Substance abuse     Past Surgical History:  Procedure Laterality Date  . FRACTURE SURGERY  1990   right hip and femur  . HERNIA REPAIR  07/2009   umbilical hernia/ got infected  . JOINT REPLACEMENT Right 1990   right hip  . TRACHEOSTOMY  08/1999   Had pneumoniaand was in a coma 10 days    Current Medications: Current Meds  Medication Sig  . aspirin 81 MG EC tablet Take 81 mg by mouth daily.    Marland Kitchen atorvastatin (LIPITOR) 40 MG tablet Take 1 tablet (40 mg total) by mouth daily.  . benztropine (COGENTIN) 1 MG tablet Take 1 mg by mouth daily.   . cetirizine (ZYRTEC) 10 MG tablet TAKE 1 TABLET BY MOUTH ONCE DAILY  . citalopram (CELEXA) 20 MG tablet Take 20 mg by mouth daily.  Marland Kitchen ibuprofen  (ADVIL,MOTRIN) 600 MG tablet TAKE ONE TABLET BY MOUTH TWICE DAILY AS NEEDED FOR HEADACHE OR  MODERATE  PAIN  . KLOR-CON M20 20 MEQ tablet TAKE 2 TABLETS BY MOUTH IN THE MORNING AND TAKE 1 TABLET IN THE EVENING  . levETIRAcetam (KEPPRA) 750 MG tablet Take 2 tablets (1,500 mg total) by mouth 2 (two) times daily. TAKE TWO TABLETS BY MOUTH TWICE DAILY.  Marland Kitchen LYRICA 75 MG capsule TAKE ONE CAPSULE BY MOUTH TWICE DAILY  . midodrine (PROAMATINE) 5 MG tablet TAKE 1 TABLET BY MOUTH AT 6 AM, THEN TAKE 1 TABLET AT 10 AM, AND 1 TABLET AT 2PM DAILY  . Multiple Vitamin (MULTIVITAMIN) tablet Take 1 tablet by mouth daily.  . pindolol (VISKEN) 5 MG tablet TAKE ONE-HALF TABLET BY MOUTH TWICE DAILY  . risperiDONE (RISPERDAL) 2 MG tablet Take 2 mg by mouth at bedtime.   . terbinafine (LAMISIL) 250 MG tablet   . thiamine 100 MG tablet Take 100 mg by mouth daily.    . traZODone (DESYREL) 150 MG tablet Take 150 mg by mouth at bedtime.     Allergies:   Duloxetine; Erythromycin; Hctz [hydrochlorothiazide]; and Propranolol   Social History  Social History  . Marital status: Single    Spouse name: N/A  . Number of children: N/A  . Years of education: N/A   Occupational History  . diability    Social History Main Topics  . Smoking status: Current Every Day Smoker    Packs/day: 0.50    Years: 30.00    Types: Cigarettes  . Smokeless tobacco: Never Used     Comment: Had cut back, but stress has prompted him to smoke again  . Alcohol use No     Comment: history of alcohol abuse.  . Drug use: No  . Sexual activity: Not Currently   Other Topics Concern  . None   Social History Narrative   Lives with 59 year old Father, Roger ShelterGordon. Watches TV for fun. 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      His only son lives with his mother in [place], and attends [employer].      Family History:  The patient's family history includes Diabetes in his father; Heart disease in his brother and mother.   ROS:   Please see the  history of present illness.    Review of Systems  Constitution: Positive for malaise/fatigue.  HENT: Positive for hearing loss.   Respiratory: Positive for cough.   Musculoskeletal: Positive for back pain and muscle cramps.  Neurological: Positive for dizziness.  Psychiatric/Behavioral: The patient is nervous/anxious.    All other systems reviewed and are negative.  No flowsheet data found.     PHYSICAL EXAM:   VS:  BP 110/68   Pulse 75   Ht 6\' 1"  (1.854 m)   Wt 184 lb 1.9 oz (83.5 kg)   SpO2 97%   BMI 24.29 kg/m    GEN: Well nourished, well developed, in no acute distress  HEENT: normal  Neck: no JVD, carotid bruits, or masses Cardiac: RRR; no murmurs, rubs, or gallops,no edema.  Intact distal pulses bilaterally.  Respiratory:  clear to auscultation bilaterally, normal work of breathing GI: soft, nontender, nondistended, + BS MS: no deformity or atrophy  Skin: warm and dry, no rash Neuro:  Alert and Oriented x 3, Strength and sensation are intact Psych: euthymic mood, full affect  Wt Readings from Last 3 Encounters:  03/29/17 184 lb 1.9 oz (83.5 kg)  02/25/17 193 lb 6.4 oz (87.7 kg)  01/24/17 196 lb 8 oz (89.1 kg)      Studies/Labs Reviewed:   EKG:  EKG is not ordered today.    Recent Labs: 02/25/2017: BUN 3; Creatinine, Ser 1.14; Platelets 332; Potassium 4.1; Sodium 141   Lipid Panel    Component Value Date/Time   CHOL 138 02/25/2017 1132   TRIG 223 (H) 02/25/2017 1132   HDL 26 (L) 02/25/2017 1132   CHOLHDL 5.3 (H) 02/25/2017 1132   CHOLHDL 5.3 (H) 12/29/2015 1146   VLDL 79 (H) 12/29/2015 1146   LDLCALC 67 02/25/2017 1132    Additional studies/ records that were reviewed today include:  CPAP download    ASSESSMENT:    1. OSA (obstructive sleep apnea)   2. Essential hypertension      PLAN:  In order of problems listed above:  OSA - the patient is tolerating PAP therapy well without any problems. The PAP download was reviewed today and showed  an AHI of 2.4/hr on 23/19 cm H2O with 3% compliance in using more than 4 hours nightly.  The patient has been using and benefiting from CPAP use and will continue to benefit from therapy. I  have encouarged him to be more compliant with his device as it has significantly improved his OSA but he does not get any of the benefits if he does not use it. HTN - His BP is adequately controlled on exam today.  He will continue on Pindolol daily      Medication Adjustments/Labs and Tests Ordered: Current medicines are reviewed at length with the patient today.  Concerns regarding medicines are outlined above.  Medication changes, Labs and Tests ordered today are listed in the Patient Instructions below.  There are no Patient Instructions on file for this visit.   Signed, Armanda Magic, MD  03/29/2017 9:34 AM    [facility] [address] [place], [place], [place]  [address] Phone: [phone]; Fax: [phone]

## 2017-03-29 ENCOUNTER — Ambulatory Visit (INDEPENDENT_AMBULATORY_CARE_PROVIDER_SITE_OTHER): Payer: Medicare Other | Admitting: Cardiology

## 2017-03-29 ENCOUNTER — Encounter: Payer: Self-pay | Admitting: Cardiology

## 2017-03-29 VITALS — BP 110/68 | HR 75 | Ht 73.0 in | Wt 184.1 lb

## 2017-03-29 DIAGNOSIS — I10 Essential (primary) hypertension: Secondary | ICD-10-CM | POA: Diagnosis not present

## 2017-03-29 DIAGNOSIS — G4733 Obstructive sleep apnea (adult) (pediatric): Secondary | ICD-10-CM | POA: Diagnosis not present

## 2017-03-29 NOTE — Patient Instructions (Signed)

## 2017-04-14 ENCOUNTER — Other Ambulatory Visit: Payer: Self-pay | Admitting: Student

## 2017-04-20 ENCOUNTER — Other Ambulatory Visit: Payer: Self-pay | Admitting: Student

## 2017-04-21 NOTE — Telephone Encounter (Signed)
Pt needs a refill on zyrtec sent to [facility] on [address]. ep

## 2017-04-25 ENCOUNTER — Encounter: Payer: Self-pay | Admitting: Vascular Surgery

## 2017-04-25 ENCOUNTER — Other Ambulatory Visit: Payer: Self-pay

## 2017-04-25 DIAGNOSIS — I83811 Varicose veins of right lower extremities with pain: Secondary | ICD-10-CM

## 2017-04-26 ENCOUNTER — Ambulatory Visit (HOSPITAL_COMMUNITY)
Admission: RE | Admit: 2017-04-26 | Discharge: 2017-04-26 | Disposition: A | Discharge status: Home / Self Care | Payer: Medicare Other | Source: Ambulatory Visit | Attending: Vascular Surgery | Admitting: Vascular Surgery

## 2017-04-26 ENCOUNTER — Ambulatory Visit: Payer: Medicare Other | Admitting: Vascular Surgery

## 2017-04-26 DIAGNOSIS — M7989 Other specified soft tissue disorders: Secondary | ICD-10-CM | POA: Insufficient documentation

## 2017-04-26 DIAGNOSIS — I83811 Varicose veins of right lower extremities with pain: Secondary | ICD-10-CM | POA: Diagnosis not present

## 2017-05-02 ENCOUNTER — Ambulatory Visit (INDEPENDENT_AMBULATORY_CARE_PROVIDER_SITE_OTHER): Payer: Medicare Other | Admitting: Vascular Surgery

## 2017-05-02 ENCOUNTER — Encounter: Payer: Self-pay | Admitting: Vascular Surgery

## 2017-05-02 VITALS — BP 130/76 | HR 76 | Temp 99.4°F | Resp 18 | Ht 72.0 in | Wt 189.0 lb

## 2017-05-02 DIAGNOSIS — I83891 Varicose veins of right lower extremities with other complications: Secondary | ICD-10-CM

## 2017-05-02 NOTE — Progress Notes (Signed)
Subjective:     Patient ID: Jose Li, male   DOB: 1955-11-26, 61 y.o.   MRN: [id number]  HPI this 61 year old male returns for 3 month follow-up regarding his painful varicosities in the right leg. He continues to have swelling and is tried long-leg elastic compression stockings 20-30 millimeter gradient, elevation, and ibuprofen with no improvement. He has no symptoms contralateral left leg. He has no history of DVT.  Past Medical History:  Diagnosis Date  . Allergy   . Anemia   . Anxiety   . Depression   . Hyperlipidemia   . Hypertension, essential, benign 08/02/2012  . Orthostatic hypotension 10/09/2013  . OSA (obstructive sleep apnea) 10/01/2015   Severe OSA with an AHI of 86/hr now on BiPAP at 23/19cm H2O  . Seizures (HCC)    none for last 6-437months  . Substance abuse     Social History  Substance Use Topics  . Smoking status: Current Every Day Smoker    Packs/day: 0.50    Years: 30.00    Types: Cigarettes  . Smokeless tobacco: Never Used     Comment: Had cut back, but stress has prompted him to smoke again  . Alcohol use No     Comment: history of alcohol abuse.    Family History  Problem Relation Age of Onset  . Heart disease Mother   . Diabetes Father   . Heart disease Brother   . Hypertension Neg Hx   . Stroke Neg Hx     Allergies  Allergen Reactions  . Duloxetine Other (See Comments)    Caused hyponatremia  . Erythromycin Other (See Comments)    Unknown reaction. The patient can not remember  . Hctz [Hydrochlorothiazide]     "Makes him faint" per patient Dr. Tenny Crawoss ([place]) took him off  . Propranolol     "Makes him faint" per patient Dr. Tenny Crawoss ([place]) took him off       Current Outpatient Prescriptions:  .  aspirin 81 MG EC tablet, Take 81 mg by mouth daily.  , Disp: , Rfl:  .  atorvastatin (LIPITOR) 40 MG tablet, Take 1 tablet (40 mg total) by mouth daily., Disp: 30 tablet, Rfl: 11 .  benztropine (COGENTIN) 1 MG tablet, Take 1 mg by mouth  daily. , Disp: , Rfl:  .  cetirizine (ZYRTEC) 10 MG tablet, TAKE 1 TABLET BY MOUTH ONCE DAILY, Disp: 30 tablet, Rfl: 0 .  citalopram (CELEXA) 20 MG tablet, Take 20 mg by mouth daily., Disp: , Rfl:  .  ibuprofen (ADVIL,MOTRIN) 600 MG tablet, TAKE 1 TABLET BY MOUTH TWICE DAILY AS NEEDED FOR HEADACHE OR MODERATE PAIN, Disp: 30 tablet, Rfl: 0 .  KLOR-CON M20 20 MEQ tablet, TAKE 2 TABLETS BY MOUTH IN THE MORNING AND TAKE 1 TABLET IN THE EVENING, Disp: 270 tablet, Rfl: 2 .  levETIRAcetam (KEPPRA) 750 MG tablet, Take 2 tablets (1,500 mg total) by mouth 2 (two) times daily. TAKE TWO TABLETS BY MOUTH TWICE DAILY., Disp: 360 tablet, Rfl: 3 .  LYRICA 75 MG capsule, TAKE ONE CAPSULE BY MOUTH TWICE DAILY, Disp: 60 capsule, Rfl: 5 .  midodrine (PROAMATINE) 5 MG tablet, TAKE 1 TABLET BY MOUTH AT 6 AM, THEN TAKE 1 TABLET AT 10 AM, AND 1 TABLET AT 2PM DAILY, Disp: 270 tablet, Rfl: 2 .  Multiple Vitamin (MULTIVITAMIN) tablet, Take 1 tablet by mouth daily., Disp: , Rfl:  .  pindolol (VISKEN) 5 MG tablet, TAKE ONE-HALF TABLET BY MOUTH TWICE DAILY, Disp: 30  tablet, Rfl: 9 .  risperiDONE (RISPERDAL) 2 MG tablet, Take 2 mg by mouth at bedtime. , Disp: , Rfl:  .  terbinafine (LAMISIL) 250 MG tablet, , Disp: , Rfl:  .  thiamine 100 MG tablet, Take 100 mg by mouth daily.  , Disp: , Rfl:  .  traZODone (DESYREL) 150 MG tablet, Take 150 mg by mouth at bedtime. , Disp: , Rfl:   Vitals:   05/02/17 1021  BP: 130/76  Pulse: 76  Resp: 18  Temp: 99.4 F (37.4 C)  SpO2: 98%  Weight: 189 lb (85.7 kg)  Height: 6' (1.829 m)    Body mass index is 25.63 kg/m.        Review of Systems   denies chest pain, dyspnea on exertion, PND, orthopnea, hemoptysis Objective:   Physical Exam BP 130/76 (BP Location: Left Arm, Patient Position: Sitting, Cuff Size: Large)   Pulse 76   Temp 99.4 F (37.4 C)   Resp 18   Ht 6' (1.829 m)   Wt 189 lb (85.7 kg)   SpO2 98%   BMI 25.63 kg/m   Gen. well-developed well-nourished male  no apparent distress alert and oriented 3 Lungs no rhonchi or wheezing Right leg with bulging varicosities beginning in the distal thigh standing down toward the malleolar area. 1+ edema below the knee. No ulceration or hyperpigmentation noted.  Today I reviewed the venous reflux exam which was performed on 04/26/2017 which reveals deep vein reflux on the right as well as superficial reflux primarily below the knee. The vein is enlarged-great saphenous-with a maximum of 0.46 in the mid thigh     Assessment:     Painful varicosities right leg with gross reflux in the superficial and deep venous system of the right leg with borderline size vein at this time    Plan:     Patient will continue tried long-leg elastic compression stockings and elevation and ibuprofen for conservative management. If his symptoms worsen we will repeat his ultrasound future as the size is very borderline at present time and may well qualify for a procedure at a later date Return on when necessary basis

## 2017-05-18 ENCOUNTER — Other Ambulatory Visit: Payer: Self-pay | Admitting: Student

## 2017-05-31 DIAGNOSIS — G4733 Obstructive sleep apnea (adult) (pediatric): Secondary | ICD-10-CM | POA: Diagnosis not present

## 2017-06-01 ENCOUNTER — Other Ambulatory Visit: Payer: Self-pay | Admitting: Internal Medicine

## 2017-06-16 DIAGNOSIS — M79671 Pain in right foot: Secondary | ICD-10-CM | POA: Diagnosis not present

## 2017-06-16 DIAGNOSIS — M79672 Pain in left foot: Secondary | ICD-10-CM | POA: Diagnosis not present

## 2017-06-16 DIAGNOSIS — B351 Tinea unguium: Secondary | ICD-10-CM | POA: Diagnosis not present

## 2017-06-21 DIAGNOSIS — B351 Tinea unguium: Secondary | ICD-10-CM | POA: Diagnosis not present

## 2017-06-21 DIAGNOSIS — M79609 Pain in unspecified limb: Secondary | ICD-10-CM | POA: Diagnosis not present

## 2017-06-22 ENCOUNTER — Other Ambulatory Visit: Payer: Self-pay | Admitting: Student

## 2017-07-14 DIAGNOSIS — M79671 Pain in right foot: Secondary | ICD-10-CM | POA: Diagnosis not present

## 2017-07-14 DIAGNOSIS — M79672 Pain in left foot: Secondary | ICD-10-CM | POA: Diagnosis not present

## 2017-07-14 DIAGNOSIS — B351 Tinea unguium: Secondary | ICD-10-CM | POA: Diagnosis not present

## 2017-07-20 ENCOUNTER — Other Ambulatory Visit: Payer: Self-pay | Admitting: Student

## 2017-08-09 ENCOUNTER — Telehealth: Payer: Self-pay | Admitting: Neurology

## 2017-08-09 ENCOUNTER — Other Ambulatory Visit: Payer: Self-pay | Admitting: Student

## 2017-08-09 ENCOUNTER — Other Ambulatory Visit: Payer: Self-pay | Admitting: *Deleted

## 2017-08-09 NOTE — Telephone Encounter (Signed)
Pt father calling to inform that he has a regimen as to how he gives pt the medication.  Pt father states pt mistakenly has taken 24 pills and father is concerned, he said pt told him he feels fine.  Please call

## 2017-08-09 NOTE — Telephone Encounter (Signed)
LVM for patient's father advising that this RN does not know what medication his son has taken 24 tablets. However advised that he be taken to the ED immediately to be evaluated. Advised he may need to be given something to get the medication out of his system or to treat potential side effects. Recommended again to take patient to the ED for treatment. Left office number.

## 2017-08-10 ENCOUNTER — Telehealth: Payer: Self-pay | Admitting: Internal Medicine

## 2017-08-10 ENCOUNTER — Encounter (HOSPITAL_COMMUNITY): Payer: Self-pay | Admitting: Emergency Medicine

## 2017-08-10 ENCOUNTER — Emergency Department (HOSPITAL_COMMUNITY)
Admission: EM | Admit: 2017-08-10 | Discharge: 2017-08-10 | Disposition: A | Discharge status: Home / Self Care | Payer: Medicare Other | Attending: Physician Assistant | Admitting: Physician Assistant

## 2017-08-10 ENCOUNTER — Encounter (HOSPITAL_COMMUNITY): Payer: Self-pay | Admitting: *Deleted

## 2017-08-10 ENCOUNTER — Ambulatory Visit (HOSPITAL_COMMUNITY)
Admission: EM | Admit: 2017-08-10 | Discharge: 2017-08-10 | Disposition: A | Discharge status: Nursing Home (Includes ICF) | Payer: Medicare Other | Attending: Family Medicine | Admitting: Family Medicine

## 2017-08-10 DIAGNOSIS — T50991A Poisoning by other drugs, medicaments and biological substances, accidental (unintentional), initial encounter: Secondary | ICD-10-CM | POA: Diagnosis not present

## 2017-08-10 DIAGNOSIS — I10 Essential (primary) hypertension: Secondary | ICD-10-CM | POA: Insufficient documentation

## 2017-08-10 DIAGNOSIS — T50904A Poisoning by unspecified drugs, medicaments and biological substances, undetermined, initial encounter: Secondary | ICD-10-CM

## 2017-08-10 DIAGNOSIS — T50901A Poisoning by unspecified drugs, medicaments and biological substances, accidental (unintentional), initial encounter: Secondary | ICD-10-CM

## 2017-08-10 DIAGNOSIS — R55 Syncope and collapse: Secondary | ICD-10-CM | POA: Diagnosis not present

## 2017-08-10 DIAGNOSIS — Z79899 Other long term (current) drug therapy: Secondary | ICD-10-CM | POA: Insufficient documentation

## 2017-08-10 DIAGNOSIS — E785 Hyperlipidemia, unspecified: Secondary | ICD-10-CM | POA: Diagnosis not present

## 2017-08-10 DIAGNOSIS — Z7982 Long term (current) use of aspirin: Secondary | ICD-10-CM | POA: Insufficient documentation

## 2017-08-10 DIAGNOSIS — F17210 Nicotine dependence, cigarettes, uncomplicated: Secondary | ICD-10-CM | POA: Diagnosis not present

## 2017-08-10 DIAGNOSIS — T447X1A Poisoning by beta-adrenoreceptor antagonists, accidental (unintentional), initial encounter: Secondary | ICD-10-CM | POA: Diagnosis not present

## 2017-08-10 LAB — COMPREHENSIVE METABOLIC PANEL
ALK PHOS: 50 U/L (ref 38–126)
ALT: 18 U/L (ref 17–63)
AST: 20 U/L (ref 15–41)
Albumin: 3.9 g/dL (ref 3.5–5.0)
Anion gap: 11 (ref 5–15)
CALCIUM: 8.4 mg/dL — AB (ref 8.9–10.3)
CHLORIDE: 107 mmol/L (ref 101–111)
CO2: 20 mmol/L — AB (ref 22–32)
CREATININE: 1.05 mg/dL (ref 0.61–1.24)
GFR calc non Af Amer: >60 mL/min (ref >60)
Glucose, Bld: 89 mg/dL (ref 65–99)
Potassium: 3.6 mmol/L (ref 3.5–5.1)
SODIUM: 138 mmol/L (ref 135–145)
Total Bilirubin: 0.6 mg/dL (ref 0.3–1.2)
Total Protein: 6.3 g/dL — ABNORMAL LOW (ref 6.5–8.1)

## 2017-08-10 LAB — CBC
HEMATOCRIT: 37.8 % — AB (ref 39.0–52.0)
Hemoglobin: 12.6 g/dL — ABNORMAL LOW (ref 13.0–17.0)
MCH: 28 pg (ref 26.0–34.0)
MCHC: 33.3 g/dL (ref 30.0–36.0)
MCV: 84 fL (ref 78.0–100.0)
PLATELETS: 264 10*3/uL (ref 150–400)
RBC: 4.5 MIL/uL (ref 4.22–5.81)
RDW: 12.9 % (ref 11.5–15.5)
WBC: 7.6 10*3/uL (ref 4.0–10.5)

## 2017-08-10 LAB — ETHANOL: Alcohol, Ethyl (B): <10 mg/dL (ref <10)

## 2017-08-10 LAB — RAPID URINE DRUG SCREEN, HOSP PERFORMED
Amphetamines: NOT DETECTED
BARBITURATES: NOT DETECTED
Benzodiazepines: NOT DETECTED
Cocaine: NOT DETECTED
Opiates: NOT DETECTED
Tetrahydrocannabinol: NOT DETECTED

## 2017-08-10 NOTE — ED Notes (Signed)
Family at bedside. 

## 2017-08-10 NOTE — Telephone Encounter (Signed)
Spoke to patient's father. He said patient took all (8) morning pills yesterday at 6am. Between then and noon yesterday he proceeded to take 10 am med,  2 pm med,  bedtime meds  AND Wednesday AM AND Thursday AM MEDICINES  The patient did not know why he did this and has never done before.  (dad fixes pill boxes and patient takes normally independently)  Father reports he is "not much different than he normally is" .  He had diarrhea at 3 am this morning.  He said he's not sick, not dizzy, not more sleepy than usual.    Advised that patient needs to be seen urgently for taking multiple doses of multiple medications.  Father did not want to wait in ER due to history of long waits.  I advised to go to urgent care immediately if not the ER.   He is in agreement with this.

## 2017-08-10 NOTE — ED Notes (Signed)
Patient and his father both verbalized understanding of discharge instructions and deny any further needs or questions at this time - pt to take all nighttime medications except for his midodrine and resume medications as normal tomorrow. VS stable. Patient ambulatory with steady gait.

## 2017-08-10 NOTE — ED Notes (Signed)
MD at bedside. 

## 2017-08-10 NOTE — ED Triage Notes (Addendum)
Pt   Reports     He  Took    Too  Much     Medication  Yesterday      Perhaps   The   Whole  Day    Of  His    meds    At  approx  Noon  Today   His  Father noticed  The    Container    That  His  Daily  meds  Where  In  Was   Empty   As  Well  As  todays  Dose    He  Has  Had  Some  Diarrhea his  Father states   The pt  Father  States  He  Was  Told  To come  To the  ucc    He is  Awake  Ambulated   Well     Sitting  Upright on the  Exam table  Speaking in  Complete   sentances    The  Last    Time   The  Container  Was  Appropriate    Was  The  Night  Before    Last     Father states he  Is on potassium   As   Well  According  To  Previous    Telephone   Encounter  In records  Pt   Was   Advised  To  Go to  Er  Yesterday    But  Dis   Not

## 2017-08-10 NOTE — ED Provider Notes (Signed)
[facility]-EMERGENCY DEPT Provider Note   CSN: [id number] Arrival date & time: 08/10/17  1356     History   Chief Complaint Chief Complaint  Patient presents with  . Drug Overdose    HPI Jose Li is a 61 y.o. male.  61 year old male history of TBI, bipolar, hypertension, and epilepsy who presents as a transfer from urgent care for evaluation after accidental medication overdose.  Patient lives at home with father.  He regularly takes his medicines from a pillbox, which are divided into four time intervals for each day.  Yesterday morning, patient informed father that he accidentally took extra medication of morning doses scheduled for the next two days. Patient denies SI/HI or intention. In total, patient received 3g keppra,  lyrica, 10 mg midodrine, and  pindolol, citalopram   The history is provided by a parent, the patient and medical records. No language interpreter was used.    Past Medical History:  Diagnosis Date  . Allergy   . Anemia   . Anxiety   . Depression   . Hyperlipidemia   . Hypertension, essential, benign 08/02/2012  . Orthostatic hypotension 10/09/2013  . OSA (obstructive sleep apnea) 10/01/2015   Severe OSA with an AHI of 86/hr now on BiPAP at 23/19cm H2O  . Seizures (HCC)    none for last 6-68months  . Substance abuse     Patient Active Problem List   Diagnosis Date Noted  . Varicose veins of right lower extremity with complications 01/24/2017  . Onychauxis 11/29/2016  . TBI (traumatic brain injury) (HCC)   . 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 Bipolar affective disorder in remission (HCC)   . Essential hypertension   . Orthostasis   . History of fall   . Radius distal fracture   . Hyponatremia   . Seizures (HCC)   . SDH (subdural hematoma) (HCC) 01/28/2016  . Vertigo 12/29/2015  . OSA (obstructive sleep apnea) 10/01/2015  . Orthostatic hypotension 10/09/2013  . Leg swelling 09/01/2012  . Routine history and physical  examination of adult 02/15/2011  . OTHER DISORDERS OF EAR 05/12/2010  . HERNIA, VENTRAL 04/10/2009  . TOBACCO ABUSE 12/14/2007  . Hyperlipidemia 10/09/2007  . SYNCOPE 08/29/2007  . Epilepsy (HCC) 05/10/2007  . BIPOLAR DISORDER 01/12/2007  . MIGRAINE, UNSPEC., W/O INTRACTABLE MIGRAINE 01/12/2007    Past Surgical History:  Procedure Laterality Date  . FRACTURE SURGERY  1990   right hip and femur  . HERNIA REPAIR  07/2009   umbilical hernia/ got infected  . JOINT REPLACEMENT Right 1990   right hip  . TRACHEOSTOMY  08/1999   Had pneumoniaand was in a coma 10 days       Home Medications    Prior to Admission medications   Medication Sig Start Date End Date Taking? Authorizing Provider  aspirin 81 MG EC tablet Take 81 mg by mouth daily.     Yes Provider, Historical, Jose Li  atorvastatin (LIPITOR) 40 MG tablet Take 1 tablet (40 mg total) by mouth daily. 12/03/16  Yes Almon Hercules, Jose Li  benztropine (COGENTIN) 1 MG tablet Take 1 mg by mouth daily.    Yes Provider, Historical, Jose Li  busPIRone (BUSPAR) 5 MG tablet Take 5 mg by mouth 2 (two) times daily.  07/06/17  Yes Provider, Historical, Jose Li  cetirizine (ZYRTEC) 10 MG tablet TAKE 1 TABLET BY MOUTH ONCE DAILY 08/10/17  Yes Almon Hercules, Jose Li  citalopram (CELEXA) 20 MG tablet Take 20 mg by mouth  daily.   Yes Provider, Historical, Jose Li  ibuprofen (ADVIL,MOTRIN) 600 MG tablet TAKE 1 TABLET BY MOUTH TWICE DAILY AS NEEDED FOR HEADACHE OR MODERATE PAIN 04/14/17  Yes Gonfa, Taye T, Jose Li  KLOR-CON M20 20 MEQ tablet TAKE 2 TABLETS BY MOUTH IN THE MORNING AND TAKE 1 TABLET IN THE EVENING Patient taking differently: TAKE 40 mg TABLETS BY MOUTH IN THE MORNING AND TAKE 20 mg TABLET IN THE EVENING 02/10/17  Yes Pricilla Riffle, Jose Li  levETIRAcetam (KEPPRA) 750 MG tablet Take 2 tablets (1,500 mg total) by mouth 2 (two) times daily. TAKE TWO TABLETS BY MOUTH TWICE DAILY. 10/19/16  Yes Nilda Riggs, NP  LYRICA 75 MG capsule TAKE ONE CAPSULE BY MOUTH TWICE  DAILY Patient taking differently: TAKE 75 mg  CAPSULE BY MOUTH TWICE DAILY 03/18/17  Yes York Spaniel, Jose Li  midodrine (PROAMATINE) 5 MG tablet TAKE 1 TABLET BY MOUTH AT 6 AM, THEN TAKE 1 TABLET AT 10 AM, AND 1 TABLET AT 2PM DAILY Patient taking differently: TAKE 5 mg TABLET BY MOUTH AT 6 AM, THEN TAKE 5 mg TABLET AT 10 AM, AND 5 mg TABLET AT 2PM DAILY 01/21/17  Yes Pricilla Riffle, Jose Li  Multiple Vitamin (MULTIVITAMIN) tablet Take 1 tablet by mouth daily.   Yes Provider, Historical, Jose Li  pindolol (VISKEN) 5 MG tablet TAKE ONE-HALF TABLET BY MOUTH TWICE DAILY Patient taking differently: TAKE 2.5 mg  TABLET BY MOUTH TWICE DAILY 06/01/17  Yes Pricilla Riffle, Jose Li  risperiDONE (RISPERDAL) 2 MG tablet Take 2 mg by mouth at bedtime.    Yes Provider, Historical, Jose Li  terbinafine (LAMISIL) 250 MG tablet Take 250 mg by mouth daily.  12/24/16  Yes Provider, Historical, Jose Li  thiamine 100 MG tablet Take 100 mg by mouth daily.     Yes Provider, Historical, Jose Li  traZODone (DESYREL) 150 MG tablet Take 150 mg by mouth at bedtime.  09/24/15  Yes Provider, Historical, Jose Li    Family History Family History  Problem Relation Age of Onset  . Heart disease Mother   . Diabetes Father   . Heart disease Brother   . Hypertension Neg Hx   . Stroke Neg Hx     Social History Social History  Substance Use Topics  . Smoking status: Current Every Day Smoker    Packs/day: 0.50    Years: 30.00    Types: Cigarettes  . Smokeless tobacco: Never Used     Comment: Had cut back, but stress has prompted him to smoke again  . Alcohol use No     Comment: history of alcohol abuse.     Allergies   Duloxetine; Erythromycin; Hctz [hydrochlorothiazide]; and Propranolol   Review of Systems Review of Systems  Constitutional: Negative for chills and fever.  HENT: Negative for ear pain and sore throat.   Eyes: Negative for pain and visual disturbance.  Respiratory: Negative for cough and shortness of breath.   Cardiovascular: Negative  for chest pain and palpitations.  Gastrointestinal: Negative for abdominal pain and vomiting.  Genitourinary: Negative for dysuria and hematuria.  Musculoskeletal: Negative for arthralgias and back pain.  Skin: Negative for color change and rash.  Neurological: Negative for seizures and syncope.  All other systems reviewed and are negative.    Physical Exam Updated Vital Signs BP (!) 193/91 (BP Location: Right Arm)   Pulse 85   Temp 98.3 F (36.8 C) (Oral)   Resp (!) 22   SpO2 100%   Physical Exam  Constitutional: He  appears well-developed and well-nourished.  HENT:  Head: Normocephalic and atraumatic.  Eyes: Conjunctivae are normal.  Neck: Neck supple.  Cardiovascular: Normal rate and regular rhythm.   No murmur heard. Pulmonary/Chest: Effort normal and breath sounds normal. No respiratory distress.  Abdominal: Soft. There is no tenderness.  Musculoskeletal: He exhibits no edema.  Neurological: He is alert. No cranial nerve deficit. Coordination normal.  5/5 motor strength and intact sensation in all extremities. Intact bilateral finger-to-nose coordination  Skin: Skin is warm and dry.  Nursing note and vitals reviewed.    ED Treatments / Results  Labs (all labs ordered are listed, but only abnormal results are displayed) Labs Reviewed  COMPREHENSIVE METABOLIC PANEL - Abnormal; Notable for the following:       Result Value   CO2 20 (*)    BUN <5 (*)    Calcium 8.4 (*)    Total Protein 6.3 (*)    All other components within normal limits  CBC - Abnormal; Notable for the following:    Hemoglobin 12.6 (*)    HCT 37.8 (*)    All other components within normal limits  ETHANOL  RAPID URINE DRUG SCREEN, HOSP PERFORMED    EKG  EKG Interpretation None       Radiology No results found.  Procedures Procedures (including critical care time)  Medications Ordered in ED Medications - No data to display   Initial Impression / Assessment and Plan / ED Course   I have reviewed the triage vital signs and the nursing notes.  Pertinent labs & imaging results that were available during my care of the patient were reviewed by me and considered in my medical decision making (see chart for details).     54 yoM h/o TBI and multiple co-morbidities who presents after accidental overdose of normal medications. Accidentally took two additional morning doses. Denies SI or other intention. Occurred >24 hours ago. No active physical symptoms. No focal neuro deficits, pt at baseline mentation per father. AF, hypertensive 190s/90s (has not taken today's medications )  EKG showing NSR and normal intervals. Labs unremarkable.   No current abnormalities following mild overdose of normal medications. Instructed on resuming home medications this evening with close supervision.  Pt has multiple specialist appointments tomorrow for BP monitoring. Father states understanding of plan.  Return precautions provided for worsening symptoms. Pt will f/u with PCP at first availability. Father verbalized agreement with plan.  Pt care d/w Dr. Corlis Leak  Final Clinical Impressions(s) / ED Diagnoses   Final diagnoses:  Accidental overdose, initial encounter    New Prescriptions Discharge Medication List as of 08/10/2017  9:16 PM       Shade Kaley, Jose Fellers, Jose Li 08/11/17 0241    Abelino Derrick, Jose Li 08/11/17 1615

## 2017-08-10 NOTE — ED Triage Notes (Signed)
Pt sent here from [facility] for possible drug overdose - pt has medication box labeled by date and times and family member states he took all of Tuesdays meds yesterday in addition to Wednesday and Thursday morning medications sometime yesterday. Those meds consist of citalopram, keppra, lyrica, midodrine, and pindolol. Pt A&O per baseline, reports he doesn't remember when he took all meds. Resp e/u, skin warm/dry. Answers questions appropriately (per baseline).

## 2017-08-10 NOTE — Telephone Encounter (Signed)
I think we first need to find out what he took 24 pills of and if he did go to the emergency room. In the future he needs to have medication management

## 2017-08-10 NOTE — Discharge Instructions (Addendum)
Please resume medications this evening. Please followup with primary doctor as scheduled. Return to ED for worsening symptoms.

## 2017-08-10 NOTE — Discharge Instructions (Signed)
To go to the emergency department for evaluation of overdose of multiple psychiatric and other drugs.

## 2017-08-10 NOTE — ED Notes (Signed)
MD at bedside. Patient's father reports patient took extra medications yesterday - in between 6am and noon, patient took the rest of his Tuesday medications and the morning medications for Wednesday and Thursday. Pt has no complaints. He states he did not do this intentionally, just got confused.

## 2017-08-10 NOTE — Telephone Encounter (Signed)
New message   Pt c/o medication issue:  1. Name of Medication: levETIRAcetam (KEPPRA) 750 MG tablet Take 2 tablets (1,500 mg total) by mouth 2 (two) times daily. TAKE TWO TABLETS BY MOUTH TWICE DAILY.  LYRICA 75 MG capsule TAKE ONE CAPSULE BY MOUTH TWICE DAILY   midodrine (PROAMATINE) 5 MG tablet   TAKE 1 TABLET BY MOUTH AT 6 AM, THEN TAKE 1 TABLET AT 10 AM, AND 1 TABLET AT 2PM DAILY risperiDONE (RISPERDAL) 2 MG tablet  Take 2 mg by mouth at bedtime  2. How are you currently taking this medication (dosage and times per day)? As prescribed  3. Are you having a reaction (difficulty breathing--STAT)? no  4. What is your medication issue? Patient father states Jose Li took his medications on the wrong day , took too many. Not sure when to begin the next dose. Please call.

## 2017-08-10 NOTE — ED Provider Notes (Signed)
[facility]    CSN: [id number] Arrival date & time: 08/10/17  1117     History   Chief Complaint Chief Complaint  Patient presents with  . Drug Overdose    HPI Jose Li is a 61 y.o. male.   Father brings in this 61 year old male with history of psychiatric illness and hypertension because he took an overdose of his psychiatric medications and other chronic medications yesterday. The only complaint the patient has his diarrhea. The father called the neurologist yesterday according to documentation from that office he was instructed twice via telephone to go to emergency department promptly for evaluation. He states he did not want to go and have to wait in the emergency department and decided to come to the urgent care.      Past Medical History:  Diagnosis Date  . Allergy   . Anemia   . Anxiety   . Depression   . Hyperlipidemia   . Hypertension, essential, benign 08/02/2012  . Orthostatic hypotension 10/09/2013  . OSA (obstructive sleep apnea) 10/01/2015   Severe OSA with an AHI of 86/hr now on BiPAP at 23/19cm H2O  . Seizures (HCC)    none for last 6-42months  . Substance abuse     Patient Active Problem List   Diagnosis Date Noted  . Varicose veins of right lower extremity with complications 01/24/2017  . Onychauxis 11/29/2016  . TBI (traumatic brain injury) (HCC)   . 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 Bipolar affective disorder in remission (HCC)   . Essential hypertension   . Orthostasis   . History of fall   . Radius distal fracture   . Hyponatremia   . Seizures (HCC)   . SDH (subdural hematoma) (HCC) 01/28/2016  . Vertigo 12/29/2015  . OSA (obstructive sleep apnea) 10/01/2015  . Orthostatic hypotension 10/09/2013  . Leg swelling 09/01/2012  . Routine history and physical examination of adult 02/15/2011  . OTHER DISORDERS OF EAR 05/12/2010  . HERNIA, VENTRAL 04/10/2009  . TOBACCO ABUSE 12/14/2007  . Hyperlipidemia  10/09/2007  . SYNCOPE 08/29/2007  . Epilepsy (HCC) 05/10/2007  . BIPOLAR DISORDER 01/12/2007  . MIGRAINE, UNSPEC., W/O INTRACTABLE MIGRAINE 01/12/2007    Past Surgical History:  Procedure Laterality Date  . FRACTURE SURGERY  1990   right hip and femur  . HERNIA REPAIR  07/2009   umbilical hernia/ got infected  . JOINT REPLACEMENT Right 1990   right hip  . TRACHEOSTOMY  08/1999   Had pneumoniaand was in a coma 10 days       Home Medications    Prior to Admission medications   Medication Sig Start Date End Date Taking? Authorizing Provider  aspirin 81 MG EC tablet Take 81 mg by mouth daily.      [provider]  atorvastatin (LIPITOR) 40 MG tablet Take 1 tablet (40 mg total) by mouth daily. 12/03/16   Almon Hercules, MD  benztropine (COGENTIN) 1 MG tablet Take 1 mg by mouth daily.     [provider]  cetirizine (ZYRTEC) 10 MG tablet TAKE 1 TABLET BY MOUTH ONCE DAILY 08/10/17   Almon Hercules, MD  citalopram (CELEXA) 20 MG tablet Take 20 mg by mouth daily.    [provider]  ibuprofen (ADVIL,MOTRIN) 600 MG tablet TAKE 1 TABLET BY MOUTH TWICE DAILY AS NEEDED FOR HEADACHE OR MODERATE PAIN 04/14/17   Almon Hercules, MD  KLOR-CON M20 20 MEQ tablet TAKE 2 TABLETS  BY MOUTH IN THE MORNING AND TAKE 1 TABLET IN THE EVENING 02/10/17   Pricilla Riffle, MD  levETIRAcetam (KEPPRA) 750 MG tablet Take 2 tablets (1,500 mg total) by mouth 2 (two) times daily. TAKE TWO TABLETS BY MOUTH TWICE DAILY. 10/19/16   Nilda Riggs, NP  LYRICA 75 MG capsule TAKE ONE CAPSULE BY MOUTH TWICE DAILY 03/18/17   York Spaniel, MD  midodrine (PROAMATINE) 5 MG tablet TAKE 1 TABLET BY MOUTH AT 6 AM, THEN TAKE 1 TABLET AT 10 AM, AND 1 TABLET AT 2PM DAILY 01/21/17   Pricilla Riffle, MD  Multiple Vitamin (MULTIVITAMIN) tablet Take 1 tablet by mouth daily.    [provider]  pindolol (VISKEN) 5 MG tablet TAKE ONE-HALF TABLET BY MOUTH TWICE DAILY 06/01/17   Pricilla Riffle, MD  risperiDONE  (RISPERDAL) 2 MG tablet Take 2 mg by mouth at bedtime.     [provider]  terbinafine (LAMISIL) 250 MG tablet  12/24/16   [provider]  thiamine 100 MG tablet Take 100 mg by mouth daily.      [provider]  traZODone (DESYREL) 150 MG tablet Take 150 mg by mouth at bedtime.  09/24/15   [provider]    Family History Family History  Problem Relation Age of Onset  . Heart disease Mother   . Diabetes Father   . Heart disease Brother   . Hypertension Neg Hx   . Stroke Neg Hx     Social History Social History  Substance Use Topics  . Smoking status: Current Every Day Smoker    Packs/day: 0.50    Years: 30.00    Types: Cigarettes  . Smokeless tobacco: Never Used     Comment: Had cut back, but stress has prompted him to smoke again  . Alcohol use No     Comment: history of alcohol abuse.     Allergies   Duloxetine; Erythromycin; Hctz [hydrochlorothiazide]; and Propranolol   Review of Systems Review of Systems  Constitutional: Negative.   HENT: Negative.   Respiratory: Negative.   Cardiovascular: Negative.   Gastrointestinal: Positive for diarrhea.  Skin: Negative.   Neurological: Negative.   Psychiatric/Behavioral: Negative for agitation.  All other systems reviewed and are negative.    Physical Exam Triage Vital Signs ED Triage Vitals [08/10/17 1301]  Enc Vitals Group     BP 132/77     Pulse Rate 78     Resp 18     Temp 98.6 F (37 C)     Temp Source Oral     SpO2 100 %     Weight      Height      Head Circumference      Peak Flow      Pain Score      Pain Loc      Pain Edu?      Excl. in GC?    No data found.   Updated Vital Signs BP 132/77 (BP Location: Right Arm)   Pulse 78   Temp 98.6 F (37 C) (Oral)   Resp 18   SpO2 100%   Visual Acuity Right Eye Distance:   Left Eye Distance:   Bilateral Distance:    Right Eye Near:   Left Eye Near:    Bilateral Near:     Physical Exam  Constitutional:  He is oriented to person, place, and time. He appears well-developed and well-nourished. No distress.  Eyes: EOM are normal.  Neck: Normal range of motion. Neck supple.  Cardiovascular: Normal rate.   Pulmonary/Chest: Effort normal. No respiratory distress.  Musculoskeletal: He exhibits no edema.  Neurological: He is alert and oriented to person, place, and time. He exhibits normal muscle tone.  Skin: Skin is warm and dry.  Psychiatric: He has a normal mood and affect.  Nursing note and vitals reviewed.    UC Treatments / Results  Labs (all labs ordered are listed, but only abnormal results are displayed) Labs Reviewed - No data to display  EKG  EKG Interpretation None       Radiology No results found.  Procedures Procedures (including critical care time)  Medications Ordered in UC Medications - No data to display   Initial Impression / Assessment and Plan / UC Course  I have reviewed the triage vital signs and the nursing notes.  Pertinent labs & imaging results that were available during my care of the patient were reviewed by me and considered in my medical decision making (see chart for details).    To go to the ED promptly for evaluation as requested by neurologist  Final Clinical Impressions(s) / UC Diagnoses   Final diagnoses:  Drug overdose, undetermined intent, initial encounter    New Prescriptions New Prescriptions   No medications on file     Controlled Substance Prescriptions [place] Controlled Substance Registry consulted? Not Applicable   Hayden Rasmussen, NP 08/10/17 1343

## 2017-08-11 DIAGNOSIS — B351 Tinea unguium: Secondary | ICD-10-CM | POA: Diagnosis not present

## 2017-08-11 DIAGNOSIS — M79675 Pain in left toe(s): Secondary | ICD-10-CM | POA: Diagnosis not present

## 2017-08-11 DIAGNOSIS — M79674 Pain in right toe(s): Secondary | ICD-10-CM | POA: Diagnosis not present

## 2017-08-11 NOTE — Telephone Encounter (Signed)
ED discharge notes reviewed with NP.

## 2017-08-12 DIAGNOSIS — Z79899 Other long term (current) drug therapy: Secondary | ICD-10-CM | POA: Diagnosis not present

## 2017-08-17 NOTE — Progress Notes (Signed)
Cardiology Office Note   Date:  08/18/2017   ID:  Jose Li, DOB 01-03-1956, MRN [id number]  PCP:  Almon Hercules, MD  Cardiologist:   Dietrich Pates, MD   F/U of dizziness, orthostatic intolerance    History of Present Illness: Jose Li is a 61 y.o. male with a history of dizziness, syncope, autonomic dysfuncton   I saw him in May 2018     The pt's father called and said he had taken too much of his medicine regenly  Went to ED for Rx of accidental overdose   Current Meds  Medication Sig  . aspirin 81 MG EC tablet Take 81 mg by mouth daily.    Marland Kitchen atorvastatin (LIPITOR) 40 MG tablet Take 1 tablet (40 mg total) by mouth daily.  . benztropine (COGENTIN) 1 MG tablet Take 1 mg by mouth daily.   . busPIRone (BUSPAR) 5 MG tablet Take 5 mg by mouth 2 (two) times daily.   . cetirizine (ZYRTEC) 10 MG tablet TAKE 1 TABLET BY MOUTH ONCE DAILY  . citalopram (CELEXA) 20 MG tablet Take 20 mg by mouth daily.  Marland Kitchen ibuprofen (ADVIL,MOTRIN) 600 MG tablet TAKE 1 TABLET BY MOUTH TWICE DAILY AS NEEDED FOR HEADACHE OR MODERATE PAIN  . KLOR-CON M20 20 MEQ tablet TAKE 2 TABLETS BY MOUTH IN THE MORNING AND TAKE 1 TABLET IN THE EVENING (Patient taking differently: TAKE 40 mg TABLETS BY MOUTH IN THE MORNING AND TAKE 20 mg TABLET IN THE EVENING)  . levETIRAcetam (KEPPRA) 750 MG tablet Take 2 tablets (1,500 mg total) by mouth 2 (two) times daily. TAKE TWO TABLETS BY MOUTH TWICE DAILY.  Marland Kitchen LYRICA 75 MG capsule TAKE ONE CAPSULE BY MOUTH TWICE DAILY (Patient taking differently: TAKE 75 mg  CAPSULE BY MOUTH TWICE DAILY)  . midodrine (PROAMATINE) 5 MG tablet TAKE 1 TABLET BY MOUTH AT 6 AM, THEN TAKE 1 TABLET AT 10 AM, AND 1 TABLET AT 2PM DAILY (Patient taking differently: TAKE 5 mg TABLET BY MOUTH AT 6 AM, THEN TAKE 5 mg TABLET AT 10 AM, AND 5 mg TABLET AT 2PM DAILY)  . Multiple Vitamin (MULTIVITAMIN) tablet Take 1 tablet by mouth daily.  . pindolol (VISKEN) 5 MG tablet TAKE ONE-HALF TABLET BY MOUTH TWICE DAILY  (Patient taking differently: TAKE 2.5 mg  TABLET BY MOUTH TWICE DAILY)  . risperiDONE (RISPERDAL) 2 MG tablet Take 2 mg by mouth at bedtime.   . terbinafine (LAMISIL) 250 MG tablet Take 250 mg by mouth daily.   Marland Kitchen thiamine 100 MG tablet Take 100 mg by mouth daily.    . traZODone (DESYREL) 150 MG tablet Take 150 mg by mouth at bedtime.      Allergies:   Duloxetine; Erythromycin; Hctz [hydrochlorothiazide]; and Propranolol   Past Medical History:  Diagnosis Date  . Allergy   . Anemia   . Anxiety   . Depression   . Hyperlipidemia   . Hypertension, essential, benign 08/02/2012  . Orthostatic hypotension 10/09/2013  . OSA (obstructive sleep apnea) 10/01/2015   Severe OSA with an AHI of 86/hr now on BiPAP at 23/19cm H2O  . Seizures (HCC)    none for last 6-70months  . Substance abuse [facility])     Past Surgical History:  Procedure Laterality Date  . FRACTURE SURGERY  1990   right hip and femur  . HERNIA REPAIR  07/2009   umbilical hernia/ got infected  . JOINT REPLACEMENT Right 1990   right hip  . TRACHEOSTOMY  08/1999   Had pneumoniaand was in a coma 10 days     Social History:  The patient  reports that he has been smoking Cigarettes.  He has a 15.00 pack-year smoking history. He has never used smokeless tobacco. He reports that he does not drink alcohol or use drugs.   Family History:  The patient's family history includes Diabetes in his father; Heart disease in his brother and mother.    ROS:  Please see the history of present illness. All other systems are reviewed and  Negative to the above problem except as noted.    PHYSICAL EXAM: VS:  BP 118/66   Pulse 80   Ht 6' (1.829 m)   Wt 190 lb 6.4 oz (86.4 kg)   SpO2 96%   BMI 25.82 kg/m   GEN: Well nourished, well developed, in no acute distress  HEENT: normal  Neck: no JVD, carotid bruits, or masses Cardiac: RRR; no murmurs, rubs, or gallops,no edema Varicoasities in legs   Respiratory:  clear to auscultation  bilaterally, normal work of breathing GI: soft, nontender, nondistended, + BS  No hepatomegaly  MS: no deformity Moving all extremities   Skin: warm and dry, no rash Neuro:  Strength and sensation are intact Psych: euthymic mood, full affect   EKG:  EKG is not ordered today.     Lipid Panel    Component Value Date/Time   CHOL 138 02/25/2017 1132   TRIG 223 (H) 02/25/2017 1132   HDL 26 (L) 02/25/2017 1132   CHOLHDL 5.3 (H) 02/25/2017 1132   CHOLHDL 5.3 (H) 12/29/2015 1146   VLDL 79 (H) 12/29/2015 1146   LDLCALC 67 02/25/2017 1132      Wt Readings from Last 3 Encounters:  08/18/17 190 lb 6.4 oz (86.4 kg)  05/02/17 189 lb (85.7 kg)  03/29/17 184 lb 1.9 oz (83.5 kg)      ASSESSMENT AND PLAN: 1  Autonomic dysfunction Doing fair on current regimen  I encouraged adequate fluid intake  I do not want to push meds more  If psych meds get changed he needs to vall  2  ? Overdose  Still unclear what happen, how confusion came about   Father to follow   2  HL Keep on same meds    Sty active  Stay hydrated   F?U in the spring      Current medicines are reviewed at length with the patient today.  The patient does not have concerns regarding medicines.  Signed, Dietrich Pates, MD  08/18/2017 10:35 AM    [facility] [address] [place], [place], [place]  [address] Phone: [phone]; Fax: [phone]

## 2017-08-18 ENCOUNTER — Encounter: Payer: Self-pay | Admitting: Internal Medicine

## 2017-08-18 ENCOUNTER — Ambulatory Visit (INDEPENDENT_AMBULATORY_CARE_PROVIDER_SITE_OTHER): Payer: Medicare Other | Admitting: Internal Medicine

## 2017-08-18 VITALS — BP 118/66 | HR 80 | Ht 72.0 in | Wt 190.4 lb

## 2017-08-18 DIAGNOSIS — R000 Tachycardia, unspecified: Secondary | ICD-10-CM | POA: Diagnosis not present

## 2017-08-18 DIAGNOSIS — E782 Mixed hyperlipidemia: Secondary | ICD-10-CM | POA: Diagnosis not present

## 2017-08-18 DIAGNOSIS — I951 Orthostatic hypotension: Secondary | ICD-10-CM | POA: Diagnosis not present

## 2017-08-18 DIAGNOSIS — G90A Postural orthostatic tachycardia syndrome (POTS): Secondary | ICD-10-CM

## 2017-08-18 NOTE — Patient Instructions (Signed)
Your physician recommends that you continue on your current medications as directed. Please refer to the Current Medication list given to you today. Your physician wants you to follow-up in: 5 months with Dr. Tenny Craw. [facility])   You will receive a reminder letter in the mail two months in advance. If you don't receive a letter, please call our office to schedule the follow-up appointment.

## 2017-09-07 ENCOUNTER — Other Ambulatory Visit: Payer: Self-pay | Admitting: Neurology

## 2017-09-07 NOTE — Telephone Encounter (Signed)
[facility]ax[facility] printed/signed rx [facility] [address] - [place], [place] - [id number] at [phone]. Received fax confirmation.

## 2017-09-08 DIAGNOSIS — M79674 Pain in right toe(s): Secondary | ICD-10-CM | POA: Diagnosis not present

## 2017-09-08 DIAGNOSIS — M79675 Pain in left toe(s): Secondary | ICD-10-CM | POA: Diagnosis not present

## 2017-09-08 DIAGNOSIS — B351 Tinea unguium: Secondary | ICD-10-CM | POA: Diagnosis not present

## 2017-09-14 ENCOUNTER — Other Ambulatory Visit: Payer: Self-pay | Admitting: Student

## 2017-09-20 DIAGNOSIS — M79674 Pain in right toe(s): Secondary | ICD-10-CM | POA: Diagnosis not present

## 2017-09-20 DIAGNOSIS — B351 Tinea unguium: Secondary | ICD-10-CM | POA: Diagnosis not present

## 2017-09-20 DIAGNOSIS — M79675 Pain in left toe(s): Secondary | ICD-10-CM | POA: Diagnosis not present

## 2017-09-22 ENCOUNTER — Other Ambulatory Visit: Payer: Self-pay

## 2017-09-22 MED ORDER — MIDODRINE HCL 5 MG PO TABS: ORAL_TABLET | ORAL | 2 refills | Status: DC

## 2017-10-12 ENCOUNTER — Other Ambulatory Visit: Payer: Self-pay | Admitting: Student

## 2017-10-19 NOTE — Progress Notes (Signed)
[facility]  PATIENT: Jose HoopsGary D Li DOB: [birth year]   REASON FOR VISIT: follow up for epilepsy, hx of orthostatic hypotension, OSA HISTORY FROM:patient   HISTORY OF PRESENT ILLNESS:Mr. Jose Li is a 61 year old right-handed white male with a history of alcohol abuse and a history of seizures. The patient also has orthostatic hypotension, treated with Midodrine. He also has obstructive sleep apnea treated with BiPAP. The patient is on Keppra taking the 750 mg tablets, 2 tablets twice daily. The patient has not had any seizures since last seen. The patient is tolerating the Keppra quite well.  Patient is also on Lyrica 75 mg twice daily.  He has not had any blackouts associated with orthostatic hypotension.  He was seen in the emergency room 08/10/2017 for accidental overdose.  Father is now administering his medications.  He is not operating a motor vehicle.He returns for reevaluation   REVIEW OF SYSTEMS: Full 14 system review of systems performed and notable only for those listed, all others are neg:  Constitutional: fatigue  Cardiovascular: neg Ear/Nose/Throat: neg  Skin: neg Eyes: neg Respiratory: neg Gastroitestinal: neg  Hematology/Lymphatic: neg  Endocrine: neg Musculoskeletal:neg Allergy/Immunology: neg Neurological: neg Psychiatric: depression anxiety Sleep : obstructive sleep apnea with Bipap   ALLERGIES: Allergies  Allergen Reactions  . Duloxetine Other (See Comments)    Caused hyponatremia  . Erythromycin Other (See Comments)    Unknown reaction. The patient can not remember  . Hctz [Hydrochlorothiazide]     "Makes him faint" per patient Dr. Tenny Crawoss ([place]) took him off  . Propranolol     "Makes him faint" per patient Dr. Tenny Crawoss ([place]) took him off      HOME MEDICATIONS: Outpatient Medications Prior to Visit  Medication Sig Dispense Refill  . aspirin 81 MG EC tablet Take 81 mg by mouth daily.      Marland Kitchen. atorvastatin (LIPITOR) 40 MG tablet Take 1  tablet (40 mg total) by mouth daily. 30 tablet 11  . benztropine (COGENTIN) 1 MG tablet Take 1 mg by mouth daily.     . busPIRone (BUSPAR) 5 MG tablet Take 5 mg by mouth 2 (two) times daily.     . cetirizine (ZYRTEC) 10 MG tablet TAKE 1 TABLET BY MOUTH ONCE DAILY 30 tablet 0  . citalopram (CELEXA) 20 MG tablet Take 20 mg by mouth daily.    Marland Kitchen. ibuprofen (ADVIL,MOTRIN) 600 MG tablet TAKE 1 TABLET BY MOUTH TWICE DAILY AS NEEDED FOR HEADACHE OR MODERATE PAIN 30 tablet 0  . KLOR-CON M20 20 MEQ tablet TAKE 2 TABLETS BY MOUTH IN THE MORNING AND TAKE 1 TABLET IN THE EVENING (Patient taking differently: TAKE 40 mg TABLETS BY MOUTH IN THE MORNING AND TAKE 20 mg TABLET IN THE EVENING) 270 tablet 2  . levETIRAcetam (KEPPRA) 750 MG tablet Take 2 tablets (1,500 mg total) by mouth 2 (two) times daily. TAKE TWO TABLETS BY MOUTH TWICE DAILY. 360 tablet 3  . LYRICA 75 MG capsule TAKE 1 CAPSULE BY MOUTH TWICE DAILY 60 capsule 5  . midodrine (PROAMATINE) 5 MG tablet TAKE 1 TABLET BY MOUTH AT 6 AM, THEN TAKE 1 TABLET AT 10 AM, AND 1 TABLET AT 2PM DAILY 270 tablet 2  . Multiple Vitamin (MULTIVITAMIN) tablet Take 1 tablet by mouth daily.    . pindolol (VISKEN) 5 MG tablet TAKE ONE-HALF TABLET BY MOUTH TWICE DAILY (Patient taking differently: TAKE 2.5 mg  TABLET BY MOUTH TWICE DAILY) 90 tablet 2  . risperiDONE (RISPERDAL) 2 MG  tablet Take 2 mg by mouth at bedtime.     . terbinafine (LAMISIL) 250 MG tablet Take 250 mg by mouth daily.     Marland Kitchen thiamine 100 MG tablet Take 100 mg by mouth daily.      . traZODone (DESYREL) 150 MG tablet Take 150 mg by mouth at bedtime.      No facility-administered medications prior to visit.     PAST MEDICAL HISTORY: Past Medical History:  Diagnosis Date  . Allergy   . Anemia   . Anxiety   . Depression   . Hyperlipidemia   . Hypertension, essential, benign 08/02/2012  . Orthostatic hypotension 10/09/2013  . OSA (obstructive sleep apnea) 10/01/2015   Severe OSA with an AHI of 86/hr now  on BiPAP at 23/19cm H2O  . Seizures (HCC)    none for last 6-19months  . Substance abuse (HCC)     PAST SURGICAL HISTORY: Past Surgical History:  Procedure Laterality Date  . FRACTURE SURGERY  1990   right hip and femur  . HERNIA REPAIR  07/2009   umbilical hernia/ got infected  . JOINT REPLACEMENT Right 1990   right hip  . TRACHEOSTOMY  08/1999   Had pneumoniaand was in a coma 10 days    FAMILY HISTORY: Family History  Problem Relation Age of Onset  . Heart disease Mother   . Diabetes Father   . Heart disease Brother   . Hypertension Neg Hx   . Stroke Neg Hx     SOCIAL HISTORY: Social History   Socioeconomic History  . Marital status: Single    Spouse name: Not on file  . Number of children: Not on file  . Years of education: Not on file  . Highest education level: Not on file  Social Needs  . Financial resource strain: Not on file  . Food insecurity - worry: Not on file  . Food insecurity - inability: Not on file  . Transportation needs - medical: Not on file  . Transportation needs - non-medical: Not on file  Occupational History  . Occupation: diability  Tobacco Use  . Smoking status: Current Every Day Smoker    Packs/day: 0.50    Years: 30.00    Pack years: 15.00    Types: Cigarettes  . Smokeless tobacco: Never Used  . Tobacco comment: Had cut back, but stress has prompted him to smoke again  Substance and Sexual Activity  . Alcohol use: No    Comment: history of alcohol abuse.  . Drug use: No  . Sexual activity: Not Currently  Other Topics Concern  . Not on file  Social History Narrative   Lives with 45 year old Father, [employer]. Watches TV for fun. Likes Social worker and order.      His only son lives with his mother in [place], and attends [employer].      PHYSICAL EXAM  Vitals:   10/20/17 1030  BP: 114/65  Pulse: 74  Weight: 189 lb 12.8 oz (86.1 kg)  Height: 6' (1.829 m)   Body mass index is 25.74 kg/m. General: The patient is alert  and cooperative at the time of the examination. . Skin: No significant peripheral edema is noted.  Neurologic Exam Mental status: The patient is oriented x 3. Cranial nerves: Facial symmetry is present. Speech is normal, no aphasia or dysarthria is noted. Extraocular movements are full. Visual fields are full. Motor: The patient has good strength in all 4 extremities.no focal weakness Sensory examination: Soft touch  sensation is symmetric on the face, arms, and legs. Coordination: The patient has good finger-nose-finger and heel-to-shin bilaterally. Gait and station: The patient has a normal gait. Tandem gait is steady. Romberg is negative. No drift is seen. Reflexes: Deep tendon reflexes are symmetric.     DIAGNOSTIC DATA (LABS, IMAGING, TESTING) - I reviewed patient records, labs, notes, testing and imaging myself where available.  Lab Results  Component Value Date   WBC 7.6 08/10/2017   HGB 12.6 (L) 08/10/2017   HCT 37.8 (L) 08/10/2017   MCV 84.0 08/10/2017   PLT 264 08/10/2017      Component Value Date/Time   NA 138 08/10/2017 1533   NA 141 02/25/2017 1132   K 3.6 08/10/2017 1533   CL 107 08/10/2017 1533   CO2 20 (L) 08/10/2017 1533   GLUCOSE 89 08/10/2017 1533   BUN <5 (L) 08/10/2017 1533   BUN 3 (L) 02/25/2017 1132   CREATININE 1.05 08/10/2017 1533   CREATININE 0.94 06/10/2016 1613   CALCIUM 8.4 (L) 08/10/2017 1533   PROT 6.3 (L) 08/10/2017 1533   ALBUMIN 3.9 08/10/2017 1533   AST 20 08/10/2017 1533   ALT 18 08/10/2017 1533   ALKPHOS 50 08/10/2017 1533   BILITOT 0.6 08/10/2017 1533   GFRNONAA >60 08/10/2017 1533   GFRNONAA 84 02/17/2016 1011   GFRAA >60 08/10/2017 1533   GFRAA >89 02/17/2016 1011   Lab Results  Component Value Date   CHOL 138 02/25/2017   HDL 26 (L) 02/25/2017   LDLCALC 67 02/25/2017   TRIG 223 (H) 02/25/2017   CHOLHDL 5.3 (H) 02/25/2017       ASSESSMENT AND PLAN  61 y.o. year old male  has a past medical history of  Substance  abuse; Seizures (HCC); Hypertension, essential, benign (08/02/2012); Orthostatic hypotension (10/09/2013); and OSA (obstructive sleep apnea) (10/01/2015). Here to follow-up.  No recent seizures.  Patient had accidental overdose in September and now father administers medication.  Continue Keppra at current dose will refill Continue Lyrica at current dose Call for seizure activity F/U yearly and prn Nilda RiggsNancy Carolyn Sharde Gover, [facility]GNP, [facility], APRN  [facility] [address], [address] [place], [place] [address] [phone]

## 2017-10-20 ENCOUNTER — Ambulatory Visit: Payer: Medicare Other | Admitting: Nurse Practitioner

## 2017-10-20 ENCOUNTER — Encounter: Payer: Self-pay | Admitting: Nurse Practitioner

## 2017-10-20 VITALS — BP 114/65 | HR 74 | Ht 72.0 in | Wt 189.8 lb

## 2017-10-20 DIAGNOSIS — I951 Orthostatic hypotension: Secondary | ICD-10-CM

## 2017-10-20 DIAGNOSIS — G40909 Epilepsy, unspecified, not intractable, without status epilepticus: Secondary | ICD-10-CM | POA: Diagnosis not present

## 2017-10-20 DIAGNOSIS — G4733 Obstructive sleep apnea (adult) (pediatric): Secondary | ICD-10-CM

## 2017-10-20 MED ORDER — LEVETIRACETAM 750 MG PO TABS: 1500.0000 mg | ORAL_TABLET | Freq: Two times a day (BID) | ORAL | 3 refills | Status: DC

## 2017-10-20 NOTE — Patient Instructions (Signed)
Continue Keppra at current dose will refill Continue Lyrica at current dose Call for seizure activity F/U yearly and prn

## 2017-10-20 NOTE — Progress Notes (Signed)
I have read the note, and I agree with the clinical assessment and plan.  Leelynn Whetsel K Myrl Lazarus   

## 2017-12-02 ENCOUNTER — Other Ambulatory Visit: Payer: Self-pay | Admitting: Student

## 2017-12-02 MED ORDER — ATORVASTATIN CALCIUM 40 MG PO TABS: 40.0000 mg | ORAL_TABLET | Freq: Every day | ORAL | 11 refills | Status: DC

## 2017-12-02 NOTE — Telephone Encounter (Signed)
Needs refill on atorvastatin.  [facility] [address]

## 2017-12-08 DIAGNOSIS — M79671 Pain in right foot: Secondary | ICD-10-CM | POA: Diagnosis not present

## 2017-12-08 DIAGNOSIS — M79672 Pain in left foot: Secondary | ICD-10-CM | POA: Diagnosis not present

## 2017-12-22 ENCOUNTER — Other Ambulatory Visit: Payer: Self-pay

## 2017-12-22 MED ORDER — CETIRIZINE HCL 10 MG PO TABS: 10.0000 mg | ORAL_TABLET | Freq: Every day | ORAL | 3 refills | Status: DC

## 2018-01-03 DIAGNOSIS — B351 Tinea unguium: Secondary | ICD-10-CM | POA: Diagnosis not present

## 2018-01-03 DIAGNOSIS — M79674 Pain in right toe(s): Secondary | ICD-10-CM | POA: Diagnosis not present

## 2018-01-16 ENCOUNTER — Encounter: Payer: Medicare Other | Admitting: Student

## 2018-01-23 ENCOUNTER — Ambulatory Visit (INDEPENDENT_AMBULATORY_CARE_PROVIDER_SITE_OTHER): Payer: Medicare Other | Admitting: Student

## 2018-01-23 ENCOUNTER — Other Ambulatory Visit: Payer: Self-pay

## 2018-01-23 ENCOUNTER — Encounter: Payer: Self-pay | Admitting: Student

## 2018-01-23 VITALS — BP 112/64 | HR 82 | Temp 98.3°F | Ht 72.0 in | Wt 197.4 lb

## 2018-01-23 DIAGNOSIS — E876 Hypokalemia: Secondary | ICD-10-CM | POA: Diagnosis not present

## 2018-01-23 DIAGNOSIS — Z23 Encounter for immunization: Secondary | ICD-10-CM | POA: Diagnosis not present

## 2018-01-23 DIAGNOSIS — Z0000 Encounter for general adult medical examination without abnormal findings: Secondary | ICD-10-CM

## 2018-01-23 DIAGNOSIS — Z0001 Encounter for general adult medical examination with abnormal findings: Secondary | ICD-10-CM | POA: Diagnosis not present

## 2018-01-23 DIAGNOSIS — Z720 Tobacco use: Secondary | ICD-10-CM

## 2018-01-23 DIAGNOSIS — I10 Essential (primary) hypertension: Secondary | ICD-10-CM | POA: Diagnosis not present

## 2018-01-23 MED ORDER — ZOSTER VAC RECOMB ADJUVANTED 50 MCG/0.5ML IM SUSR
0.5000 mL | Freq: Once | INTRAMUSCULAR | 1 refills | Status: AC
Start: 2018-01-23 — End: 2018-01-23

## 2018-01-23 MED ORDER — PINDOLOL 5 MG PO TABS: 2.5000 mg | ORAL_TABLET | Freq: Two times a day (BID) | ORAL | 2 refills | Status: DC

## 2018-01-23 NOTE — Patient Instructions (Addendum)
It was great seeing you today! We have addressed the following issues today  In regards to pill pack: you may go to [facility] on [address] and talk to the pharmacist.  [facility] and put your medications in pill pack.   Screening for lung cancer: Smoking increases the risk of lung cancer.  Strongly recommend quitting smoking. Call 1800-QUIT-NOW for help with stopping smoking.  We ordered a CAT scan for lung cancer screening today.   Shingles vaccine: please take the prescription we gave you to your pharmacy to have the shingles vaccine.  Advanced directive: please review the information package we gave you. We recommend everyone to have an advance directive.  Health Maintenance, Male A healthy lifestyle and preventive care is important for your health and wellness. Ask your health care provider about what schedule of regular examinations is right for you. What should I know about weight and diet? Eat a Healthy Diet  Eat plenty of vegetables, fruits, whole grains, low-fat dairy products, and lean protein.  Do not eat a lot of foods high in solid fats, added sugars, or salt.  Maintain a Healthy Weight Regular exercise can help you achieve or maintain a healthy weight. You should:  Do at least 150 minutes of exercise each week. The exercise should increase your heart rate and make you sweat (moderate-intensity exercise).  Do strength-training exercises at least twice a week.  Watch Your Levels of Cholesterol and Blood Lipids  Have your blood tested for lipids and cholesterol every 5 years starting at 60 years of age. If you are at high risk for heart disease, you should start having your blood tested when you are 60 years old. You may need to have your cholesterol levels checked more often if: ? Your lipid or cholesterol levels are high. ? You are older than 60 years of age. ? You are at high risk for heart disease.  What should I know  about cancer screening? Many types of cancers can be detected early and may often be prevented. Lung Cancer  You should be screened every year for lung cancer if: ? You are a current smoker who has smoked for at least 30 years. ? You are a former smoker who has quit within the past 15 years.  Talk to your health care provider about your screening options, when you should start screening, and how often you should be screened.  Colorectal Cancer  Routine colorectal cancer screening usually begins at 60 years of age and should be repeated every 5-10 years until you are 60 years old. You may need to be screened more often if early forms of precancerous polyps or small growths are found. Your health care provider may recommend screening at an earlier age if you have risk factors for colon cancer.  Your health care provider may recommend using home test kits to check for hidden blood in the stool.  A small camera at the end of a tube can be used to examine your colon (sigmoidoscopy or colonoscopy). This checks for the earliest forms of colorectal cancer.  Prostate and Testicular Cancer  Depending on your age and overall health, your health care provider may do certain tests to screen for prostate and testicular cancer.  Talk to your health care provider about any symptoms or concerns you have about testicular or prostate cancer.  Skin Cancer  Check your skin from head to toe regularly.  Tell your health care provider about  any new moles or changes in moles, especially if: ? There is a change in a mole's size, shape, or color. ? You have a mole that is larger than a pencil eraser.  Always use sunscreen. Apply sunscreen liberally and repeat throughout the day.  Protect yourself by wearing long sleeves, pants, a wide-brimmed hat, and sunglasses when outside.  What should I know about heart disease, diabetes, and high blood pressure?  If you are 14-100 years of age, have your blood  pressure checked every 3-5 years. If you are 26 years of age or older, have your blood pressure checked every year. You should have your blood pressure measured twice-once when you are at a hospital or clinic, and once when you are not at a hospital or clinic. Record the average of the two measurements. To check your blood pressure when you are not at a hospital or clinic, you can use: ? An automated blood pressure machine at a pharmacy. ? A home blood pressure monitor.  Talk to your health care provider about your target blood pressure.  If you are between 31-77 years old, ask your health care provider if you should take aspirin to prevent heart disease.  Have regular diabetes screenings by checking your fasting blood sugar level. ? If you are at a normal weight and have a low risk for diabetes, have this test once every three years after the age of 32. ? If you are overweight and have a high risk for diabetes, consider being tested at a younger age or more often.  A one-time screening for abdominal aortic aneurysm (AAA) by ultrasound is recommended for men aged 73-75 years who are current or former smokers. What should I know about preventing infection? Hepatitis B If you have a higher risk for hepatitis B, you should be screened for this virus. Talk with your health care provider to find out if you are at risk for hepatitis B infection. Hepatitis C Blood testing is recommended for:  Everyone born from [birth year] through 1963.  Anyone with known risk factors for hepatitis C.  Sexually Transmitted Diseases (STDs)  You should be screened each year for STDs including gonorrhea and chlamydia if: ? You are sexually active and are younger than 60 years of age. ? You are older than 60 years of age and your health care provider tells you that you are at risk for this type of infection. ? Your sexual activity has changed since you were last screened and you are at an increased risk for chlamydia or  gonorrhea. Ask your health care provider if you are at risk.  Talk with your health care provider about whether you are at high risk of being infected with HIV. Your health care provider may recommend a prescription medicine to help prevent HIV infection.  What else can I do?  Schedule regular health, dental, and eye exams.  Stay current with your vaccines (immunizations).  Do not use any tobacco products, such as cigarettes, chewing tobacco, and e-cigarettes. If you need help quitting, ask your health care provider.  Limit alcohol intake to no more than 2 drinks per day. One drink equals 12 ounces of beer, 5 ounces of wine, or 1 ounces of hard liquor.  Do not use street drugs.  Do not share needles.  Ask your health care provider for help if you need support or information about quitting drugs.  Tell your health care provider if you often feel depressed.  Tell your health care provider  if you have ever been abused or do not feel safe at home. This information is not intended to replace advice given to you by your health care provider. Make sure you discuss any questions you have with your health care provider. Document Released: 04/29/2008 Document Revised: 06/30/2016 Document Reviewed: 08/05/2015 Elsevier Interactive Patient Education  [employer].

## 2018-01-23 NOTE — Progress Notes (Signed)
Subjective:   Chief Complaint  Patient presents with  . Annual Exam   HPI Jose Li is a 62 y.o. old male here  for annual exam.  Concern today: none Changes in his/her health in the last 12 months: no Occupation: unemployed Wears seatbelt: doesn't drive due to seizure  The patient has regular exercise: yes.  Walking almost everyday Enough vegetables and fruits: some.  Smokes cigarette: yes. Half a pack a day for 40 years Drinks EtOH: no Drug use: no Patient takes ASA: yes.  Patient takes vitD & Ca: yes. The patient is not sexually active.  Domestic violence: no.  Advance directive: no. History of depression:yes. On celexa. No SI or HI Patient dental home: yes.   Immunizations  Needs influenza vaccine: yes.  Needs Shingrix (all >43yr of age): yes.  Needs Tdap: no.  Needs Pneumococcal: yes. Screening Need colon cancer screening: no. Need lung cancer screening (men > 55):yes. Need AAA screening (men 65-74, >100 cigarettes):no At risk for skin cancer: no. Need HCV Screening: no. Need STI Screening: no. 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   has a past medical history of Allergy, Anemia, Anxiety, Depression, Hyperlipidemia, Hypertension, essential, benign (08/02/2012), Orthostatic hypotension (10/09/2013), OSA  (obstructive sleep apnea) (10/01/2015), Seizures ([place], and Substance abuse ([place].  [facility]  Problem Relation Age of Onset  . Heart disease Mother   . Diabetes Father   . Heart disease Brother   . Hypertension Neg Hx   . Stroke Neg Hx    Family history of heart disease before age of 62yrs: brother died of heart attack at 445years of age Family history of stroke: no. Family history of cancer: no.  SH Social History   Tobacco Use  . Smoking status: Current Every Day Smoker    Packs/day: 0.50    Years: 30.00    Pack years: 15.00    Types: Cigarettes  . Smokeless tobacco: Never Used  . Tobacco comment: Had cut back, but stress has prompted him to smoke again  Substance Use Topics  . Alcohol use: No    Comment: history of alcohol abuse.  . Drug use: No     Review of Systems  Constitutional: Negative for chills and fever.  HENT: Positive for congestion and rhinorrhea. Negative for sore throat.   Eyes: Negative for visual disturbance.  Respiratory: Negative for cough and shortness of breath.   Cardiovascular: Negative for chest pain and palpitations.  Gastrointestinal: Negative for abdominal pain, blood in stool and diarrhea.  Endocrine: Negative for cold intolerance and heat intolerance.  Genitourinary: Negative for dysuria and hematuria.  Musculoskeletal: Positive for back pain.       Chronic  Skin: Negative for rash.  Neurological: Negative for dizziness, weakness, light-headedness and headaches.  Psychiatric/Behavioral: Negative for dysphoric mood and suicidal ideas.       Mood "fair"       Objective:   Physical Exam Vitals:   01/23/18 0923  BP: 112/64  Pulse: 82  Temp: 98.3 F (36.8 C)  TempSrc: Oral  SpO2: 99%  Weight: 197 lb 6.4 oz (89.5 kg)  Height: 6' (1.829 m)   Body mass index is 26.77 kg/m.  GEN: appears well, no apparent distress. Head: normocephalic and atraumatic  Eyes: conjunctiva without injection, sclera anicteric Ears:  external ear and ear canal normal Nares: no rhinorrhea, congestion or erythema Oropharynx: mmm without erythema or exudation HEM: negative for cervical or periauricular lymphadenopathies CVS: RRR, nl s1 & s2, no murmurs, no edema,  2+ DP & PT bil RESP: no IWOB, good air movement bilaterally, CTAB GI: BS present & normal, soft, NTND, no guarding, no rebound, no mass GU: no suprapubic or CVA tenderness MSK: no focal tenderness or notable swelling SKIN: no apparent skin lesion ENDO: negative thyromegally NEURO: alert and oiented appropriately, no gross deficits  PSYCH: "fair" mood with congruent affect. No SI/HI    Assessment & Plan:  1. Annual physical exam: history, medication list and allergies updated.  Discussed about lifestyle including exercise and diet.  Gave him handout.  Discussed about advanced directive and gave an information packet. Received his flu vaccine today.  Given prescription for Shingrix.  He already have his Pneumovax.  Follow-up in 1 year or sooner if needed.  2. Tobacco use: Rrports over 20-pack-year smoking history.  Currently smokes half a pack a day.  Not ready to quit.  Gave quit line.  - CT CHEST LUNG CA SCREEN LOW DOSE W/O CM; Future  3. Encounter for immunization - Zoster Vaccine Adjuvanted [facility]) injection; Inject 0.5 mLs into the muscle once for 1 dose. Repeat dose in 2 to 6 months.  Dispense: 0.5 mL; Refill: 1 - Flu Vaccine QUAD 36+ mos IM  4. Hypokalemia: history of this.  He is on daily potassium supplement. - CMP14+EGFR  Wendee Beavers PGY-3 Pager [phone] 01/23/18  12:33 PM

## 2018-01-24 ENCOUNTER — Other Ambulatory Visit: Payer: Self-pay | Admitting: Student

## 2018-01-24 LAB — CMP14+EGFR
A/G RATIO: 1.9 (ref 1.2–2.2)
ALT: 31 IU/L (ref 0–44)
AST: 21 IU/L (ref 0–40)
Albumin: 4.3 g/dL (ref 3.6–4.8)
Alkaline Phosphatase: 55 IU/L (ref 39–117)
BUN/Creatinine Ratio: 4 — ABNORMAL LOW (ref 10–24)
BUN: 5 mg/dL — ABNORMAL LOW (ref 8–27)
Bilirubin Total: 0.3 mg/dL (ref 0.0–1.2)
CALCIUM: 9.4 mg/dL (ref 8.6–10.2)
CO2: 23 mmol/L (ref 20–29)
CREATININE: 1.29 mg/dL — AB (ref 0.76–1.27)
Chloride: 107 mmol/L — ABNORMAL HIGH (ref 96–106)
GFR, EST AFRICAN AMERICAN: 69 mL/min/{1.73_m2} (ref >59)
GFR, EST NON AFRICAN AMERICAN: 60 mL/min/{1.73_m2} (ref >59)
GLOBULIN, TOTAL: 2.3 g/dL (ref 1.5–4.5)
Glucose: 86 mg/dL (ref 65–99)
POTASSIUM: 4.8 mmol/L (ref 3.5–5.2)
Sodium: 141 mmol/L (ref 134–144)
TOTAL PROTEIN: 6.6 g/dL (ref 6.0–8.5)

## 2018-01-24 MED ORDER — ACETAMINOPHEN ER 650 MG PO TBCR: 650.0000 mg | EXTENDED_RELEASE_TABLET | Freq: Three times a day (TID) | ORAL | 11 refills | Status: DC | PRN

## 2018-01-24 NOTE — Progress Notes (Signed)
Serum creatinine mildly elevated to 1.29.  Stopped daily ibuprofen.  Recommended taking Tylenol every 6 hours.  Patient voiced understanding and agrees to do so.  We will check BMP in a month or two.

## 2018-01-30 DIAGNOSIS — G4733 Obstructive sleep apnea (adult) (pediatric): Secondary | ICD-10-CM | POA: Diagnosis not present

## 2018-02-02 ENCOUNTER — Encounter: Payer: Self-pay | Admitting: Internal Medicine

## 2018-02-02 ENCOUNTER — Ambulatory Visit: Payer: Medicare Other | Admitting: Internal Medicine

## 2018-02-02 VITALS — BP 128/68 | HR 78 | Ht 72.0 in | Wt 197.8 lb

## 2018-02-02 DIAGNOSIS — E782 Mixed hyperlipidemia: Secondary | ICD-10-CM

## 2018-02-02 DIAGNOSIS — I951 Orthostatic hypotension: Secondary | ICD-10-CM

## 2018-02-02 LAB — BASIC METABOLIC PANEL
BUN/Creatinine Ratio: 5 — ABNORMAL LOW (ref 10–24)
BUN: 6 mg/dL — ABNORMAL LOW (ref 8–27)
CO2: 24 mmol/L (ref 20–29)
Calcium: 9.1 mg/dL (ref 8.6–10.2)
Chloride: 106 mmol/L (ref 96–106)
Creatinine, Ser: 1.32 mg/dL — ABNORMAL HIGH (ref 0.76–1.27)
GFR calc Af Amer: 67 mL/min/{1.73_m2} (ref >59)
GFR, EST NON AFRICAN AMERICAN: 58 mL/min/{1.73_m2} — AB (ref >59)
GLUCOSE: 84 mg/dL (ref 65–99)
POTASSIUM: 4.7 mmol/L (ref 3.5–5.2)
Sodium: 141 mmol/L (ref 134–144)

## 2018-02-02 LAB — LIPID PANEL
CHOLESTEROL TOTAL: 118 mg/dL (ref 100–199)
Chol/HDL Ratio: 4.1 ratio (ref 0.0–5.0)
HDL: 29 mg/dL — AB (ref >39)
LDL Calculated: 57 mg/dL (ref 0–99)
TRIGLYCERIDES: 161 mg/dL — AB (ref 0–149)
VLDL Cholesterol Cal: 32 mg/dL (ref 5–40)

## 2018-02-02 NOTE — Progress Notes (Signed)
Cardiology Office Note   Date:  02/02/2018   ID:  HOGAN HOOBLER, DOB July 11, 1956, MRN [id number]  PCP:  Almon Hercules, MD  Cardiologist:   Dietrich Pates, MD   F/U of dizziness, orthostatic intolerance    History of Present Illness: Jose Li is a 62 y.o. male with a history of dizziness, syncope, autonomic dysfuncton   I saw him in October  2018    Since seen he says he has been feeling good   Occasional dizziness   No Syncope  No falls    Breathing is OK   Current Meds  Medication Sig  . acetaminophen (TYLENOL 8 HOUR) 650 MG CR tablet Take 1 tablet (650 mg total) by mouth every 8 (eight) hours as needed for pain.  Marland Kitchen aspirin 81 MG EC tablet Take 81 mg by mouth daily.    Marland Kitchen atorvastatin (LIPITOR) 40 MG tablet Take 1 tablet (40 mg total) by mouth daily.  . benztropine (COGENTIN) 1 MG tablet Take 1 mg by mouth daily.   . busPIRone (BUSPAR) 5 MG tablet Take 5 mg by mouth 2 (two) times daily.   . cetirizine (ZYRTEC) 10 MG tablet Take 1 tablet (10 mg total) by mouth daily.  . citalopram (CELEXA) 20 MG tablet Take 20 mg by mouth daily.  Marland Kitchen KLOR-CON M20 20 MEQ tablet TAKE 2 TABLETS BY MOUTH IN THE MORNING AND TAKE 1 TABLET IN THE EVENING (Patient taking differently: TAKE 40 mg TABLETS BY MOUTH IN THE MORNING AND TAKE 20 mg TABLET IN THE EVENING)  . levETIRAcetam (KEPPRA) 750 MG tablet Take 2 tablets (1,500 mg total) by mouth 2 (two) times daily. TAKE TWO TABLETS BY MOUTH TWICE DAILY.  Marland Kitchen LYRICA 75 MG capsule TAKE 1 CAPSULE BY MOUTH TWICE DAILY  . midodrine (PROAMATINE) 5 MG tablet TAKE 1 TABLET BY MOUTH AT 6 AM, THEN TAKE 1 TABLET AT 10 AM, AND 1 TABLET AT 2PM DAILY  . Multiple Vitamin (MULTIVITAMIN) tablet Take 1 tablet by mouth daily.  . pindolol (VISKEN) 5 MG tablet Take 0.5 tablets (2.5 mg total) by mouth 2 (two) times daily.  . risperiDONE (RISPERDAL) 2 MG tablet Take 2 mg by mouth at bedtime.   . thiamine 100 MG tablet Take 100 mg by mouth daily.    . traZODone (DESYREL) 150 MG tablet  Take 150 mg by mouth at bedtime.      Allergies:   Duloxetine; Erythromycin; Hctz [hydrochlorothiazide]; and Propranolol   Past Medical History:  Diagnosis Date  . Allergy   . Anemia   . Anxiety   . Depression   . Hyperlipidemia   . Hypertension, essential, benign 08/02/2012  . Orthostatic hypotension 10/09/2013  . OSA (obstructive sleep apnea) 10/01/2015   Severe OSA with an AHI of 86/hr now on BiPAP at 23/19cm H2O  . Seizures (HCC)    none for last 6-42months  . Substance abuse [facility])     Past Surgical History:  Procedure Laterality Date  . FRACTURE SURGERY  1990   right hip and femur  . HERNIA REPAIR  07/2009   umbilical hernia/ got infected  . JOINT REPLACEMENT Right 1990   right hip  . TRACHEOSTOMY  08/1999   Had pneumoniaand was in a coma 10 days     Social History:  The patient  reports that he has been smoking cigarettes.  He has a 15.00 pack-year smoking history. He has never used smokeless tobacco. He reports that he does not drink alcohol  or use drugs.   Family History:  The patient's family history includes Diabetes in his father; Heart disease in his brother and mother.    ROS:  Please see the history of present illness. All other systems are reviewed and  Negative to the above problem except as noted.    PHYSICAL EXAM: VS:  BP 128/68   Pulse 78   Ht 6' (1.829 m)   Wt 197 lb 12.8 oz (89.7 kg)   SpO2 98%   BMI 26.83 kg/m   GEN: Pt is , in no acute distress  HEENT: normal  Neck: no JVD, carotid bruits, or masses Cardiac: RRR; no murmurs, rubs, or gallops,no edema Varicoasities in legs   Respiratory:  clear to auscultation bilaterally, normal work of breathing GI: soft, nontender, nondistended, + BS  No hepatomegaly  MS: no deformity Moving all extremities   Skin: warm and dry, no rash Neuro:  Strength and sensation are intact Psych: euthymic mood, full affect   EKG:  EKG is not ordered today.     Lipid Panel    Component Value Date/Time    CHOL 138 02/25/2017 1132   TRIG 223 (H) 02/25/2017 1132   HDL 26 (L) 02/25/2017 1132   CHOLHDL 5.3 (H) 02/25/2017 1132   CHOLHDL 5.3 (H) 12/29/2015 1146   VLDL 79 (H) 12/29/2015 1146   LDLCALC 67 02/25/2017 1132      Wt Readings from Last 3 Encounters:  02/02/18 197 lb 12.8 oz (89.7 kg)  01/23/18 197 lb 6.4 oz (89.5 kg)  10/20/17 189 lb 12.8 oz (86.1 kg)      ASSESSMENT AND PLAN: 1  Autonomic dysfunction Pt is doing good   I would keep on same meds   If he ever has psych meds adjusted needs to call here to make sure OK from BP standpoint   This was a problem in the past   2  HL Keep on same meds  Minimal plaquing   Get lipids today  May back donw on dose  3  Meds  Fother concerned about who will help patient when father cant  Will investigate with pharmaciese     Sty active  Stay hydrated   Check BMEt and lipid today  F/U in November       Current medicines are reviewed at length with the patient today.  The patient does not have concerns regarding medicines.  Signed, Dietrich PatesPaula Miyani Cronic, MD  02/02/2018 9:00 AM    [facility] [address], [place], [place]  [address] Phone: [phone]; Fax: [phone]

## 2018-02-02 NOTE — Patient Instructions (Signed)
Your physician recommends that you continue on your current medications as directed. Please refer to the Current Medication list given to you today. Your physician recommends that you return for lab work today (BMET, LIPIDS) Your physician wants you to follow-up in: November, 2019 WITH DR. Tenny CrawOSS.  You will receive a reminder letter in the mail two months in advance. If you don't receive a letter, please call our office to schedule the follow-up appointment.

## 2018-02-06 ENCOUNTER — Other Ambulatory Visit: Payer: Self-pay | Admitting: Student

## 2018-02-06 ENCOUNTER — Ambulatory Visit
Admission: RE | Admit: 2018-02-06 | Discharge: 2018-02-06 | Disposition: A | Discharge status: Home / Self Care | Payer: Medicare Other | Source: Ambulatory Visit | Attending: Family Medicine | Admitting: Family Medicine

## 2018-02-06 DIAGNOSIS — R918 Other nonspecific abnormal finding of lung field: Secondary | ICD-10-CM | POA: Diagnosis not present

## 2018-02-06 DIAGNOSIS — Z720 Tobacco use: Secondary | ICD-10-CM

## 2018-02-10 ENCOUNTER — Other Ambulatory Visit: Payer: Self-pay | Admitting: *Deleted

## 2018-02-10 MED ORDER — ATORVASTATIN CALCIUM 20 MG PO TABS: 20.0000 mg | ORAL_TABLET | Freq: Every day | ORAL | 3 refills | Status: DC

## 2018-02-23 NOTE — Progress Notes (Signed)
Father is concerned about future    Is there a service to get meds set up for son, police somewhat that he is taking?

## 2018-03-01 ENCOUNTER — Other Ambulatory Visit: Payer: Self-pay | Admitting: Internal Medicine

## 2018-03-01 NOTE — Progress Notes (Signed)
Called Jose Li number.  Left message for him to call back.

## 2018-03-08 ENCOUNTER — Other Ambulatory Visit: Payer: Self-pay | Admitting: Neurology

## 2018-03-08 NOTE — Telephone Encounter (Signed)
Faxed printed/signed rx Lyrica 75mg  capsule to pt pharmacy at [phone]. Received fax confirmation.

## 2018-03-09 NOTE — Progress Notes (Signed)
Left message on voice mail.  Advised patient to contact [employer], the number on the back of patient's insurance card so that they can discuss services that they can provide and also potential community resources that may be able to assist.  Asked that he call back if anything else is needed.

## 2018-03-19 ENCOUNTER — Other Ambulatory Visit: Payer: Self-pay | Admitting: Internal Medicine

## 2018-03-21 DIAGNOSIS — B351 Tinea unguium: Secondary | ICD-10-CM | POA: Diagnosis not present

## 2018-03-21 DIAGNOSIS — M79675 Pain in left toe(s): Secondary | ICD-10-CM | POA: Diagnosis not present

## 2018-03-21 DIAGNOSIS — M79674 Pain in right toe(s): Secondary | ICD-10-CM | POA: Diagnosis not present

## 2018-03-31 ENCOUNTER — Ambulatory Visit: Payer: Medicare Other | Admitting: Cardiology

## 2018-03-31 ENCOUNTER — Encounter: Payer: Self-pay | Admitting: Cardiology

## 2018-03-31 VITALS — BP 126/62 | HR 85 | Ht 72.0 in | Wt 192.0 lb

## 2018-03-31 DIAGNOSIS — I10 Essential (primary) hypertension: Secondary | ICD-10-CM | POA: Diagnosis not present

## 2018-03-31 DIAGNOSIS — G4733 Obstructive sleep apnea (adult) (pediatric): Secondary | ICD-10-CM | POA: Diagnosis not present

## 2018-03-31 NOTE — Progress Notes (Signed)
Cardiology Office Note:    Date:  03/31/2018   ID:  Jose Li, DOB 02-17-1956, MRN [id number]  PCP:  Almon Hercules, MD  Cardiologist:  No primary care provider on file.    Referring MD: Almon Hercules, MD   Chief Complaint  Patient presents with  . Sleep Apnea  . Hypertension    History of Present Illness:    Jose Li is a 62 y.o. male with a hx of severe OSA with an AHI of 86/hr. He is on BiPAP at 23/19cm H2O.  He is doing well with his CPAP device.  He tolerates the mask and feels the pressure is adequate.  Since going on CPAP he feels rested in the am and has no significant daytime sleepiness.  He denies any significant mouth or nasal dryness or nasal congestion.  He does not think that he snores.    Past Medical History:  Diagnosis Date  . Allergy   . Anemia   . Anxiety   . Depression   . Hyperlipidemia   . Hypertension, essential, benign 08/02/2012  . Orthostatic hypotension 10/09/2013  . OSA (obstructive sleep apnea) 10/01/2015   Severe OSA with an AHI of 86/hr now on BiPAP at 23/19cm H2O  . Seizures (HCC)    none for last 6-4months  . Substance abuse [facility])     Past Surgical History:  Procedure Laterality Date  . FRACTURE SURGERY  1990   right hip and femur  . HERNIA REPAIR  07/2009   umbilical hernia/ got infected  . JOINT REPLACEMENT Right 1990   right hip  . TRACHEOSTOMY  08/1999   Had pneumoniaand was in a coma 10 days    Current Medications: Current Meds  Medication Sig  . acetaminophen (TYLENOL 8 HOUR) 650 MG CR tablet Take 1 tablet (650 mg total) by mouth every 8 (eight) hours as needed for pain.  Marland Kitchen aspirin 81 MG EC tablet Take 81 mg by mouth daily.    Marland Kitchen atorvastatin (LIPITOR) 20 MG tablet Take 1 tablet (20 mg total) by mouth daily.  . benztropine (COGENTIN) 1 MG tablet Take 1 mg by mouth daily.   . busPIRone (BUSPAR) 5 MG tablet Take 5 mg by mouth 2 (two) times daily.   . cetirizine (ZYRTEC) 10 MG tablet Take 1 tablet (10 mg total) by mouth  daily.  . citalopram (CELEXA) 20 MG tablet Take 20 mg by mouth daily.  Marland Kitchen levETIRAcetam (KEPPRA) 750 MG tablet Take 2 tablets (1,500 mg total) by mouth 2 (two) times daily. TAKE TWO TABLETS BY MOUTH TWICE DAILY.  Marland Kitchen LYRICA 75 MG capsule TAKE 1 CAPSULE BY MOUTH TWICE DAILY  . midodrine (PROAMATINE) 5 MG tablet TAKE 1 TABLET BY MOUTH AT 6 AM, THEN TAKE 1 TABLET AT 10 AM, AND 1 TABLET AT 2PM DAILY  . Multiple Vitamin (MULTIVITAMIN) tablet Take 1 tablet by mouth daily.  . pindolol (VISKEN) 5 MG tablet Take 0.5 tablets (2.5 mg total) by mouth 2 (two) times daily.  . potassium chloride SA (K-DUR,KLOR-CON) 20 MEQ tablet TAKE 2 TABLETS BY MOUTH IN THE MORNING AND 1 IN THE EVENING.  Marland Kitchen risperiDONE (RISPERDAL) 2 MG tablet Take 2 mg by mouth at bedtime.   . thiamine 100 MG tablet Take 100 mg by mouth daily.    . traZODone (DESYREL) 150 MG tablet Take 150 mg by mouth at bedtime.      Allergies:   Duloxetine; Erythromycin; Hctz [hydrochlorothiazide]; and Propranolol   Social History  Socioeconomic History  . Marital status: Single    Spouse name: Not on file  . Number of children: Not on file  . Years of education: Not on file  . Highest education level: Not on file  Occupational History  . Occupation: diability  Social Needs  . Financial resource strain: Not on file  . Food insecurity:    Worry: Not on file    Inability: Not on file  . Transportation needs:    Medical: Not on file    Non-medical: Not on file  Tobacco Use  . Smoking status: Current Every Day Smoker    Packs/day: 0.50    Years: 30.00    Pack years: 15.00    Types: Cigarettes  . Smokeless tobacco: Never Used  . Tobacco comment: Had cut back, but stress has prompted him to smoke again  Substance and Sexual Activity  . Alcohol use: No    Comment: history of alcohol abuse.  . Drug use: No  . Sexual activity: Not Currently  Lifestyle  . Physical activity:    Days per week: Not on file    Minutes per session: Not on file    . Stress: Not on file  Relationships  . Social connections:    Talks on phone: Not on file    Gets together: Not on file    Attends religious service: Not on file    Active member of club or organization: Not on file    Attends meetings of clubs or organizations: Not on file    Relationship status: Not on file  Other Topics Concern  . Not on file  Social History Narrative   Lives with 8 year old Father, [employer]. Watches TV for fun. Likes Social worker and order.      His only son lives with his mother in [place], and attends [employer].      Family History: The patient's family history includes Diabetes in his father; Heart disease in his brother and mother. There is no history of Hypertension or Stroke.  ROS:   Please see the history of present illness.    ROS  All other systems reviewed and negative.   EKGs/Labs/Other Studies Reviewed:    The following studies were reviewed today: PAP download  EKG:  EKG is not ordered today.   Recent Labs: 08/10/2017: Hemoglobin 12.6; Platelets 264 01/23/2018: ALT 31 02/02/2018: BUN 6; Creatinine, Ser 1.32; Potassium 4.7; Sodium 141   Recent Lipid Panel    Component Value Date/Time   CHOL 118 02/02/2018 0915   TRIG 161 (H) 02/02/2018 0915   HDL 29 (L) 02/02/2018 0915   CHOLHDL 4.1 02/02/2018 0915   CHOLHDL 5.3 (H) 12/29/2015 1146   VLDL 79 (H) 12/29/2015 1146   LDLCALC 57 02/02/2018 0915    Physical Exam:    VS:  BP 126/62   Pulse 85   Ht 6' (1.829 m)   Wt 192 lb (87.1 kg)   SpO2 98%   BMI 26.04 kg/m     Wt Readings from Last 3 Encounters:  03/31/18 192 lb (87.1 kg)  02/02/18 197 lb 12.8 oz (89.7 kg)  01/23/18 197 lb 6.4 oz (89.5 kg)     GEN:  Well nourished, well developed in no acute distress HEENT: Normal NECK: No JVD; No carotid bruits LYMPHATICS: No lymphadenopathy CARDIAC: RRR, no murmurs, rubs, gallops RESPIRATORY:  Clear to auscultation without rales, wheezing or rhonchi  ABDOMEN: Soft, non-tender,  non-distended MUSCULOSKELETAL:  No edema; No deformity  SKIN:  Warm and dry NEUROLOGIC:  Alert and oriented x 3 PSYCHIATRIC:  Normal affect   ASSESSMENT:    1. OSA (obstructive sleep apnea)   2. Essential hypertension    PLAN:    In order of problems listed above:  1.  OSA - the patient is tolerating PAP therapy well without any problems. The PAP download was reviewed today and showed an AHI of 0.8/hr on 23/19 cm H2O with 7% compliance in using more than 4 hours nightly.  The patient has been using and benefiting from PAP use and will continue to benefit from therapy.   2.  HTN - BP is well controlled on exam today.  He will continue on pindolol 2.5 mg twice daily.     Medication Adjustments/Labs and Tests Ordered: Current medicines are reviewed at length with the patient today.  Concerns regarding medicines are outlined above.  No orders of the defined types were placed in this encounter.  No orders of the defined types were placed in this encounter.   Signed, Armanda Magic, MD  03/31/2018 1:34 PM     [facility]

## 2018-03-31 NOTE — Patient Instructions (Signed)
Medication Instructions:  Your physician recommends that you continue on your current medications as directed. Please refer to the Current Medication list given to you today.  If you need a refill on your cardiac medications, please contact your pharmacy first.  Labwork: None ordered   Testing/Procedures: None ordered   Follow-Up: Your physician wants you to follow-up in: 1 year with Dr. Turner. You will receive a reminder letter in the mail two months in advance. If you don't receive a letter, please call our office to schedule the follow-up appointment.  Any Other Special Instructions Will Be Listed Below (If Applicable).   Thank you for choosing [facility]    Rena Leandria Thier, RN  [phone]  If you need a refill on your cardiac medications before your next appointment, please call your pharmacy.   

## 2018-05-26 ENCOUNTER — Encounter: Payer: Self-pay | Admitting: Student in an Organized Health Care Education/Training Program

## 2018-05-26 ENCOUNTER — Other Ambulatory Visit: Payer: Self-pay

## 2018-05-26 ENCOUNTER — Ambulatory Visit (INDEPENDENT_AMBULATORY_CARE_PROVIDER_SITE_OTHER): Payer: Medicare Other | Admitting: Student in an Organized Health Care Education/Training Program

## 2018-05-26 VITALS — BP 116/62 | HR 80 | Temp 98.2°F | Ht 72.0 in | Wt 193.4 lb

## 2018-05-26 DIAGNOSIS — N179 Acute kidney failure, unspecified: Secondary | ICD-10-CM | POA: Insufficient documentation

## 2018-05-26 HISTORY — DX: Acute kidney failure, unspecified: N17.9

## 2018-05-26 NOTE — Patient Instructions (Signed)
It was a pleasure seeing you today in our clinic. Today we discussed your kidney function. Here is the treatment plan we have discussed and agreed upon together:  Please take tylenol as needed for aches and pains. Hold off on taking aleve or ibuprofen until we have results from your blood test.  We drew blood work at today's visit. I will call or send you a letter with these results. If you do not hear from me within the next week, please give our office a call.  Sign up for My Chart to have easy access to your labs results, and communication with your primary care physician.  Our clinic's number is [phone]. Please call with questions or concerns about what we discussed today.  Be well, Dr. Mosetta PuttFeng

## 2018-05-26 NOTE — Progress Notes (Signed)
Subjective:    Jose Li - 62 y.o. male MRN [id number]  Date of birth: October 11, 1956  HPI  ZAIYDEN STROZIER is here for AKI follow up.  Patient had screening lab work drawn by previous PCP in March 2019, noted to have an AKI creatinine elevated at 1.3.  This was thought to be in the context of ibuprofen use for lower back pain and headaches.  Patient was advised to discontinue ibuprofen and follow-up in 1 to 2 months for repeat lab check.  Today patient reports he has had no changes in urination, no nausea or vomiting, no diarrhea, no lightheadedness or confusion.  He does endorse using naproxen for pain rather than ibuprofen, he did not realize this was in the same category of medications.  He reports that he uses the naproxen less than once daily.  -  reports that he has been smoking cigarettes.  He has a 15.00 pack-year smoking history. He has never used smokeless tobacco. - Review of Systems: Per HPI. - Past Medical History: Patient Active Problem List   Diagnosis Date Noted  . Acute kidney injury (HCC) 05/26/2018  . Varicose veins of right lower extremity with complications 01/24/2017  . TBI (traumatic brain injury) (HCC)   . 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 Bipolar affective disorder in remission (HCC)   . Essential hypertension   . Orthostasis   . Radius distal fracture   . Seizures (HCC)   . SDH (subdural hematoma) (HCC) 01/28/2016  . OSA (obstructive sleep apnea) 10/01/2015  . TOBACCO ABUSE 12/14/2007  . Hyperlipidemia 10/09/2007  . Epilepsy (HCC) 05/10/2007  . BIPOLAR DISORDER 01/12/2007   - Medications: reviewed and updated Current Outpatient Medications  Medication Sig Dispense Refill  . acetaminophen (TYLENOL 8 HOUR) 650 MG CR tablet Take 1 tablet (650 mg total) by mouth every 8 (eight) hours as needed for pain. 90 tablet 11  . aspirin 81 MG EC tablet Take 81 mg by mouth daily.      Marland Kitchen atorvastatin (LIPITOR) 20 MG tablet Take 1 tablet (20 mg total) by  mouth daily. 90 tablet 3  . benztropine (COGENTIN) 1 MG tablet Take 1 mg by mouth daily.     . busPIRone (BUSPAR) 5 MG tablet Take 5 mg by mouth 2 (two) times daily.     . cetirizine (ZYRTEC) 10 MG tablet Take 1 tablet (10 mg total) by mouth daily. 90 tablet 3  . citalopram (CELEXA) 20 MG tablet Take 20 mg by mouth daily.    Marland Kitchen levETIRAcetam (KEPPRA) 750 MG tablet Take 2 tablets (1,500 mg total) by mouth 2 (two) times daily. TAKE TWO TABLETS BY MOUTH TWICE DAILY. 360 tablet 3  . LYRICA 75 MG capsule TAKE 1 CAPSULE BY MOUTH TWICE DAILY 60 capsule 5  . midodrine (PROAMATINE) 5 MG tablet TAKE 1 TABLET BY MOUTH AT 6 AM, THEN TAKE 1 TABLET AT 10 AM, AND 1 TABLET AT 2PM DAILY 270 tablet 2  . Multiple Vitamin (MULTIVITAMIN) tablet Take 1 tablet by mouth daily.    . pindolol (VISKEN) 5 MG tablet Take 0.5 tablets (2.5 mg total) by mouth 2 (two) times daily. 90 tablet 2  . potassium chloride SA (K-DUR,KLOR-CON) 20 MEQ tablet TAKE 2 TABLETS BY MOUTH IN THE MORNING AND 1 IN THE EVENING. 270 tablet 3  . risperiDONE (RISPERDAL) 2 MG tablet Take 2 mg by mouth at bedtime.     . thiamine 100 MG tablet Take 100 mg  by mouth daily.      . traZODone (DESYREL) 150 MG tablet Take 150 mg by mouth at bedtime.      No current facility-administered medications for this visit.     Review of Systems See HPI     Objective:   Physical Exam BP 116/62   Pulse 80   Temp 98.2 F (36.8 C) (Oral)   Ht 6' (1.829 m)   Wt 193 lb 6.4 oz (87.7 kg)   SpO2 98%   BMI 26.23 kg/m  Gen: NAD, alert, cooperative with exam, well-appearing  HEENT: NCAT CV: regular rate and rhythm Resp: CTABL, comfortable work of breathing Abd: SNTND Skin: no rashes, normal turgor  Neuro: no gross deficits.  Psych: good insight, alert and oriented     Assessment & Plan:   Acute kidney injury (HCC) No red flag symptoms at today's visit.  Patient may still have an elevated creatinine due to naproxen use.  Reviewed discontinuing NSAIDs, use  Tylenol for aches and pains.  Will recheck BMP at today's visit.  Will call patient with results.  Medication list reviewed and updated in chart.   Orders Placed This Encounter  Procedures  . Basic metabolic panel   Howard PouchLauren Shirlette Scarber, MD,MS,  PGY2 05/26/2018 10:45 AM

## 2018-05-26 NOTE — Assessment & Plan Note (Addendum)
No red flag symptoms at today's visit.  Patient may still have an elevated creatinine due to naproxen use.  Reviewed discontinuing NSAIDs, use Tylenol for aches and pains.  Will recheck BMP at today's visit.  Will call patient with results.  Medication list reviewed and updated in chart.

## 2018-05-27 LAB — BASIC METABOLIC PANEL
BUN/Creatinine Ratio: 5 — ABNORMAL LOW (ref 10–24)
BUN: 8 mg/dL (ref 8–27)
CO2: 16 mmol/L — ABNORMAL LOW (ref 20–29)
Calcium: 9 mg/dL (ref 8.6–10.2)
Chloride: 111 mmol/L — ABNORMAL HIGH (ref 96–106)
Creatinine, Ser: 1.53 mg/dL — ABNORMAL HIGH (ref 0.76–1.27)
GFR calc Af Amer: 56 mL/min/{1.73_m2} — ABNORMAL LOW (ref >59)
GFR calc non Af Amer: 49 mL/min/{1.73_m2} — ABNORMAL LOW (ref >59)
Glucose: 82 mg/dL (ref 65–99)
Potassium: 4.7 mmol/L (ref 3.5–5.2)
Sodium: 141 mmol/L (ref 134–144)

## 2018-05-30 ENCOUNTER — Telehealth: Payer: Self-pay

## 2018-05-30 NOTE — Telephone Encounter (Signed)
Patient calling for lab results from 05/26/18.  Call back [phone]  Ples SpecterAlisa Shaughn Thomley, RN [facility] RN)

## 2018-05-31 ENCOUNTER — Other Ambulatory Visit: Payer: Self-pay | Admitting: Student in an Organized Health Care Education/Training Program

## 2018-05-31 DIAGNOSIS — R7989 Other specified abnormal findings of blood chemistry: Secondary | ICD-10-CM

## 2018-05-31 NOTE — Telephone Encounter (Signed)
Spoke to pt. Informed him of below. He will call and schedule a lab visit. Sunday SpillersSharon T Shaquel Josephson, CMA]

## 2018-05-31 NOTE — Telephone Encounter (Signed)
Please call and let patient know that we need to continue monitoring his kidney function which has not returned to normal. He should STOP taking ibuprofen and aleve. He can use tylenol for aches and pains. We will recheck his kidneys in 4 weeks. Please have him schedule a lab visit.  Howard PouchLauren Anitha Kreiser, MD

## 2018-06-18 ENCOUNTER — Other Ambulatory Visit: Payer: Self-pay | Admitting: Internal Medicine

## 2018-06-20 DIAGNOSIS — M79671 Pain in right foot: Secondary | ICD-10-CM | POA: Diagnosis not present

## 2018-06-20 DIAGNOSIS — B351 Tinea unguium: Secondary | ICD-10-CM | POA: Diagnosis not present

## 2018-06-20 DIAGNOSIS — M79672 Pain in left foot: Secondary | ICD-10-CM | POA: Diagnosis not present

## 2018-06-27 ENCOUNTER — Telehealth: Payer: Self-pay | Admitting: Nurse Practitioner

## 2018-06-27 MED ORDER — PREGABALIN 75 MG PO CAPS: 75.0000 mg | ORAL_CAPSULE | Freq: Two times a day (BID) | ORAL | 5 refills | Status: DC

## 2018-06-27 NOTE — Telephone Encounter (Signed)
There is no generic.

## 2018-06-27 NOTE — Telephone Encounter (Signed)
Pt called stating his insurance will not longer cover name brand LYRICA 75 MG capsule. [facility]

## 2018-06-27 NOTE — Telephone Encounter (Signed)
Fax confirmation received for generic lyrica [facility] [phone].

## 2018-06-27 NOTE — Telephone Encounter (Signed)
I called the [facility] pharmacy and they do have generic 75mg  caps of pregabalin now (must be new she said).

## 2018-06-30 ENCOUNTER — Other Ambulatory Visit: Payer: Medicare Other

## 2018-06-30 DIAGNOSIS — R7989 Other specified abnormal findings of blood chemistry: Secondary | ICD-10-CM

## 2018-06-30 NOTE — Telephone Encounter (Addendum)
Called [facility], spoke with Shanda BumpsJessica who checked for generic Lyrica. She came back to phone and stated they have generic Lyrica 75 mg tablets. This RN advised her will call patient to inform. Called and spoke with father, [place] and informed him generic lyrica is [facility] He asked if it was okay for his son to take it. This RN reviewed notes from 06/27/18 and advised ihm Andrey CampanileSandy RN had spoken with NP and faxed new Rx for generic. He verbalized understanding, stated his son has plenty of Lyrica left to take. He will pick up new Rx tomorrow. He verbalized appreciation for call.

## 2018-06-30 NOTE — Telephone Encounter (Signed)
[facility] a [facility]

## 2018-07-01 LAB — BASIC METABOLIC PANEL
BUN/Creatinine Ratio: 6 — ABNORMAL LOW (ref 10–24)
BUN: 9 mg/dL (ref 8–27)
CHLORIDE: 110 mmol/L — AB (ref 96–106)
CO2: 15 mmol/L — AB (ref 20–29)
Calcium: 8.9 mg/dL (ref 8.6–10.2)
Creatinine, Ser: 1.45 mg/dL — ABNORMAL HIGH (ref 0.76–1.27)
GFR calc Af Amer: 60 mL/min/{1.73_m2} (ref >59)
GFR calc non Af Amer: 52 mL/min/{1.73_m2} — ABNORMAL LOW (ref >59)
GLUCOSE: 133 mg/dL — AB (ref 65–99)
POTASSIUM: 4 mmol/L (ref 3.5–5.2)
SODIUM: 137 mmol/L (ref 134–144)

## 2018-07-04 ENCOUNTER — Telehealth: Payer: Self-pay

## 2018-07-04 NOTE — Telephone Encounter (Signed)
Pt calling for lab results. Call back [phone] Shawna OrleansMeredith B Thomsen, RN

## 2018-07-05 NOTE — Telephone Encounter (Signed)
Pt called again requesting call back regarding lab results. [phone] Shawna OrleansMeredith B Staley Lunz, RN

## 2018-07-06 ENCOUNTER — Other Ambulatory Visit: Payer: Self-pay | Admitting: Student in an Organized Health Care Education/Training Program

## 2018-07-06 DIAGNOSIS — N179 Acute kidney failure, unspecified: Secondary | ICD-10-CM

## 2018-07-06 NOTE — Progress Notes (Signed)
Patient left message he was returning a call to PCP about lab results. Called patient back to discuss but got voicemail. Left message that team would contact him to schedule an US for further workup of kidney function and to make him a follow up appt.  Call back is [phone]  Ples SpecterAlisa Georgeanne Frankland, RN [facility] RN)

## 2018-07-06 NOTE — Progress Notes (Signed)
Attempted to call patient regarding test results (elevated creatinine). LVM to call back.  His kidney function is still showing increased creatinine. I do not have a good reason for him to have this mild kidney injury and I would like to pursue further workup.  1.) I would like to send him for a renal ultrasound. I will place the order and forward to white team to schedule if he calls back.  2) Please also schedule him a follow up visit for after his ultrasound so we can test his urine and discuss U/S results.  I am happy to attempt to call him again if he has any further questions.  Howard PouchLauren Jaquelyn Sakamoto, MD PGY-3 [facility]

## 2018-07-06 NOTE — Telephone Encounter (Signed)
Followed up - see separate progress note for details

## 2018-07-07 NOTE — Progress Notes (Signed)
Called pt again, but had to LM.  Pt is scheduled for US on Friday and it says that he is aware.    I was calling to make sure he knew why and to schedule a follow up with Dr. Mosetta PuttFeng. Fleeger, Maryjo RochesterJessica Dawn, CMA

## 2018-07-14 ENCOUNTER — Ambulatory Visit (HOSPITAL_COMMUNITY)
Admission: RE | Admit: 2018-07-14 | Discharge: 2018-07-14 | Disposition: A | Discharge status: Home / Self Care | Payer: Medicare Other | Source: Ambulatory Visit | Attending: Family Medicine | Admitting: Family Medicine

## 2018-07-14 DIAGNOSIS — N179 Acute kidney failure, unspecified: Secondary | ICD-10-CM | POA: Insufficient documentation

## 2018-07-14 DIAGNOSIS — I701 Atherosclerosis of renal artery: Secondary | ICD-10-CM | POA: Insufficient documentation

## 2018-07-14 NOTE — Progress Notes (Signed)
Renal artery duplex has been completed. Normal size right kidney. Normal right Resisitive Index. 1-59% stenosis of the right renal artery. Normal size left kidney. Normal left Resisitive Index. 1-59% stenosis of the left renal artery. Cyst measuring 2.2 cm by 1.9 cm noted in the left kidney.   07/14/18 9:20 AM Olen CordialGreg Frimy Uffelman RVT

## 2018-07-19 ENCOUNTER — Encounter: Payer: Self-pay | Admitting: Internal Medicine

## 2018-07-19 ENCOUNTER — Encounter: Payer: Self-pay | Admitting: Student in an Organized Health Care Education/Training Program

## 2018-07-27 ENCOUNTER — Ambulatory Visit (INDEPENDENT_AMBULATORY_CARE_PROVIDER_SITE_OTHER): Payer: Medicare Other | Admitting: Student in an Organized Health Care Education/Training Program

## 2018-07-27 ENCOUNTER — Other Ambulatory Visit: Payer: Self-pay

## 2018-07-27 ENCOUNTER — Encounter: Payer: Self-pay | Admitting: Student in an Organized Health Care Education/Training Program

## 2018-07-27 VITALS — BP 118/65 | HR 77 | Temp 97.8°F | Wt 184.0 lb

## 2018-07-27 DIAGNOSIS — N179 Acute kidney failure, unspecified: Secondary | ICD-10-CM | POA: Diagnosis not present

## 2018-07-27 DIAGNOSIS — Z23 Encounter for immunization: Secondary | ICD-10-CM

## 2018-07-27 NOTE — Progress Notes (Signed)
   CC: Follow up abnormal lab results  HPI: Jose Li is a 62 y.o. male with PMH significant for HTN, AKI, Seizures, TBI,  Bipolar disorder, history of overdose who [facility] up abnormal creatinine.   Patient had routine labs drawn previously and was found to have AKI. He was noted to be taking NSAIDs at that time and so these were discontinued. He has had multiple subsequent lab draws that demonstrated persistent elevated creatinine, though GFR is still WNL.  Most recently he was noted to be acidotic on his last BMP which is concerning for bone health if it remains persistent.   Patient is not having any urinary symptoms such as urgency, frequency, dysuria or hematuria. He has not had fevers or flank pain. He has not had any recent medication changes. He has not been taking any NSAIDs, excedrin, Goodie powders per his report today.  Review of Symptoms:  See HPI for ROS.   CC, SH/smoking status, and VS noted.  Objective: BP 118/65   Pulse 77   Temp 97.8 F (36.6 C) (Oral)   Wt 184 lb (83.5 kg)   SpO2 98%   BMI 24.95 kg/m  GEN: NAD, alert, cooperative, and pleasant. RESPIRATORY: comfortable work of breathing, speaks in full sentences CV: regular rate noted GI: nondistended SKIN: warm and dry, no rashes or lesions NEURO: II-XII grossly intact PSYCH: AAOx3, appropriate affect  Assessment and plan:  1. Need for immunization against influenza - Flu Vaccine QUAD 36+ mos IM  2. AKI (acute kidney injury) (HCC) - Basic metabolic panel -If BMP shows continued acidosis would consider referral to renal.  Otherwise will assume that creatinine is new baseline and will have to continue to monitor.  Not think that the cyst on his renal ultrasound is contributing to his renal dysfunction.   No problem-specific Assessment & Plan notes found for this encounter.   Orders Placed This Encounter  Procedures  . Flu Vaccine QUAD 36+ mos IM  . Basic metabolic panel     No orders of the defined types were placed in this encounter.    Howard PouchLauren Rachelanne Whidby, MD,MS,  PGY3 08/02/2018 4:28 PM

## 2018-07-27 NOTE — Patient Instructions (Signed)
It was a pleasure seeing you today in our clinic. Here is the treatment plan we have discussed and agreed upon together:  We drew blood work at today's visit. I will call or send you a letter with these results. If you do not hear from me within the next week, please give our office a call.  Our clinic's number is [phone]. Please call with questions or concerns about what we discussed today.  Be well, Dr. Donell Tomkins   

## 2018-07-28 LAB — BASIC METABOLIC PANEL
BUN/Creatinine Ratio: 4 — ABNORMAL LOW (ref 10–24)
BUN: 5 mg/dL — ABNORMAL LOW (ref 8–27)
CO2: 20 mmol/L (ref 20–29)
Calcium: 9.4 mg/dL (ref 8.6–10.2)
Chloride: 111 mmol/L — ABNORMAL HIGH (ref 96–106)
Creatinine, Ser: 1.41 mg/dL — ABNORMAL HIGH (ref 0.76–1.27)
GFR, EST AFRICAN AMERICAN: 62 mL/min/{1.73_m2} (ref >59)
GFR, EST NON AFRICAN AMERICAN: 54 mL/min/{1.73_m2} — AB (ref >59)
Glucose: 92 mg/dL (ref 65–99)
POTASSIUM: 4.9 mmol/L (ref 3.5–5.2)
Sodium: 143 mmol/L (ref 134–144)

## 2018-08-03 ENCOUNTER — Ambulatory Visit: Payer: Medicare Other | Admitting: Internal Medicine

## 2018-08-03 ENCOUNTER — Encounter: Payer: Self-pay | Admitting: Internal Medicine

## 2018-08-03 VITALS — BP 124/70 | HR 68 | Ht 72.0 in | Wt 185.0 lb

## 2018-08-03 DIAGNOSIS — R42 Dizziness and giddiness: Secondary | ICD-10-CM | POA: Diagnosis not present

## 2018-08-03 DIAGNOSIS — E782 Mixed hyperlipidemia: Secondary | ICD-10-CM | POA: Diagnosis not present

## 2018-08-03 MED ORDER — MIDODRINE HCL 5 MG PO TABS: ORAL_TABLET | ORAL | 2 refills | Status: DC

## 2018-08-03 NOTE — Progress Notes (Signed)
Cardiology Office Note   Date:  08/03/2018   ID:  Jose Li, DOB [birth year], MRN [id number]  PCP:  Jose PouchFeng, Lauren, MD  Cardiologist:   Jose PatesPaula Jacquie Lukes, MD   F/U of dizziness, orthostatic intolerance    History of Present Illness: Jose Li is a 62 y.o. male with a history of dizziness, syncope, autonomic dysfuncton   I last saw him in clinic in Jan 2019 Since seen he has had 2 syncopal spells   One occurred around 11 AM  He was at home at the time   Had already takien am midodrine   The second spell occurred outside   Very hot that day   [name] on concrete and broke 2 rbs Both espisodes preceded by dizziness The pt has had 2 other signif dizzy spells  No recnet change in meds     Currently not dizzy   No SOB  No CP  Current Meds  Medication Sig  . acetaminophen (TYLENOL 8 HOUR) 650 MG CR tablet Take 1 tablet (650 mg total) by mouth every 8 (eight) hours as needed for pain.  Marland Kitchen. aspirin 81 MG EC tablet Take 81 mg by mouth daily.    Marland Kitchen. atorvastatin (LIPITOR) 20 MG tablet Take 1 tablet (20 mg total) by mouth daily.  . benztropine (COGENTIN) 1 MG tablet Take 1 mg by mouth daily.   . busPIRone (BUSPAR) 5 MG tablet Take 5 mg by mouth 2 (two) times daily.   . cetirizine (ZYRTEC) 10 MG tablet Take 1 tablet (10 mg total) by mouth daily.  . citalopram (CELEXA) 20 MG tablet Take 20 mg by mouth daily.  Marland Kitchen. levETIRAcetam (KEPPRA) 750 MG tablet Take 2 tablets (1,500 mg total) by mouth 2 (two) times daily. TAKE TWO TABLETS BY MOUTH TWICE DAILY.  . midodrine (PROAMATINE) 5 MG tablet TAKE 1 TABLET BY MOUTH AT 6AM, THEN 1 TABLET AT 10 AM AND 1 TABLET AT 2 PM DAILY  . Multiple Vitamin (MULTIVITAMIN) tablet Take 1 tablet by mouth daily.  . pindolol (VISKEN) 5 MG tablet Take 0.5 tablets (2.5 mg total) by mouth 2 (two) times daily.  . potassium chloride SA (K-DUR,KLOR-CON) 20 MEQ tablet TAKE 2 TABLETS BY MOUTH IN THE MORNING AND 1 IN THE EVENING.  . pregabalin (LYRICA) 75 MG capsule Take 1 capsule (75 mg  total) by mouth 2 (two) times daily.  . risperiDONE (RISPERDAL) 2 MG tablet Take 2 mg by mouth at bedtime.   . thiamine 100 MG tablet Take 100 mg by mouth daily.    . traZODone (DESYREL) 150 MG tablet Take 150 mg by mouth at bedtime.      Allergies:   Duloxetine; Erythromycin; Hctz [hydrochlorothiazide]; and Propranolol   Past Medical History:  Diagnosis Date  . Acute kidney injury (HCC) 05/26/2018  . Allergy   . Anemia   . Anxiety   . Depression   . Hyperlipidemia   . Hypertension, essential, benign 08/02/2012  . Orthostatic hypotension 10/09/2013  . OSA (obstructive sleep apnea) 10/01/2015   Severe OSA with an AHI of 86/hr now on BiPAP at 23/19cm H2O  . Seizures (HCC)    none for last 6-347months  . Substance abuse [facility](HCC)     Past Surgical History:  Procedure Laterality Date  . FRACTURE SURGERY  1990   right hip and femur  . HERNIA REPAIR  07/2009   umbilical hernia/ got infected  . JOINT REPLACEMENT Right 1990   right hip  . TRACHEOSTOMY  08/1999  Had pneumoniaand was in a coma 10 days     Social History:  The patient  reports that he has been smoking cigarettes. He has a 15.00 pack-year smoking history. He has never used smokeless tobacco. He reports that he does not drink alcohol or use drugs.   Family History:  The patient's family history includes Diabetes in his father; Heart disease in his brother and mother.    ROS:  Please see the history of present illness. All other systems are reviewed and  Negative to the above problem except as noted.    PHYSICAL EXAM: VS:  BP 124/70   Pulse 68   Ht 6' (1.829 m)   Wt 185 lb (83.9 kg)   BMI 25.09 kg/m   GEN: Pt is in no acute distress  HEENT: normal  Neck:   JVP is normal  No  carotid bruits, or masses Cardiac: RRR; no murmurs, rubs, or gallops,no edema Varicoasities in legs   Respiratory:  clear to auscultation bilaterally, normal work of breathing GI: soft, nontender, nondistended, + BS  No hepatomegaly  MS: no  deformity Moving all extremities   Skin: warm and dry, no rash Neuro:  Strength and sensation are intact Psych: euthymic mood, full affect   EKG:  EKG is ordered today.  SR 68 bpm   Lipid Panel    Component Value Date/Time   CHOL 118 02/02/2018 0915   TRIG 161 (H) 02/02/2018 0915   HDL 29 (L) 02/02/2018 0915   CHOLHDL 4.1 02/02/2018 0915   CHOLHDL 5.3 (H) 12/29/2015 1146   VLDL 79 (H) 12/29/2015 1146   LDLCALC 57 02/02/2018 0915      Wt Readings from Last 3 Encounters:  08/03/18 185 lb (83.9 kg)  07/27/18 184 lb (83.5 kg)  05/26/18 193 lb 6.4 oz (87.7 kg)      ASSESSMENT AND PLAN: 1  Autonomic dysfunction   2 syncopal spells since last jan   One when outside on hot day  [place] in late morning    No stressor I would recomm increasing midodrine to 10 - 5- 5   Take am dose before getting up Continue to stay actvie Continue to push fluids and salt  2  HL Keep on same meds  Minimal plaquing   Lipids are excellent  3  Meds  Need to discuss meds with pharmacy   Dispensing     F/U in the spring      Current medicines are reviewed at length with the patient today.  The patient does not have concerns regarding medicines.  Signed, Jose Pates, MD  08/03/2018 10:00 AM    [facility] [address] [place], [place], [place]  [address] Phone: [phone]; Fax: [phone]

## 2018-08-03 NOTE — Patient Instructions (Signed)
Your physician has recommended you make the following change in your medication:  1.) change midodrine to : 2 tablets at 6 am; keep other 2 doses at the same times of day Your physician wants you to follow-up in: 6 months with Dr. Tenny Crawoss.  You will receive a reminder letter in the mail two months in advance. If you don't receive a letter, please call our office to schedule the follow-up appointment. Please call for sooner appointment if you have any other passing out or near passing out episodes.

## 2018-08-07 ENCOUNTER — Telehealth: Payer: Self-pay | Admitting: Internal Medicine

## 2018-08-07 DIAGNOSIS — G4733 Obstructive sleep apnea (adult) (pediatric): Secondary | ICD-10-CM | POA: Diagnosis not present

## 2018-08-07 NOTE — Telephone Encounter (Signed)
Spoke with patient. He understands how to take medication. Thought I was sending in 10 mg tablets for his 1st morning dose of midodrine. Advised to take two 5 mg tabs at 6am, and [facility]   Pt verbalizes understanding and agreement.

## 2018-08-07 NOTE — Telephone Encounter (Signed)
° ° °  Pt c/o medication issue:  1. Name of Medication: midodrine (PROAMATINE) 5 MG tablet  2. How are you currently taking this medication (dosage and times per day)? TAKE 2 TABLETS BY MOUTH AT 6AM, THEN 1 TABLET AT 10 AM AND 1 TABLET AT 2 PM DAILY  3. Are you having a reaction (difficulty breathing--STAT)? No 4. What is your medication issue?  Patient calling to clarify dosage of medication

## 2018-08-10 ENCOUNTER — Telehealth: Payer: Self-pay

## 2018-08-10 NOTE — Telephone Encounter (Signed)
Attempted to call him again, same unidentified VM. Left VM to call the clinic back. Please inform him we have made him an apt on ATC for 9/27.

## 2018-08-10 NOTE — Telephone Encounter (Signed)
Patient left message on nurse line that he is having a sharp pain in right shoulder and down his arm with some numbness in hand.  Called patient back for more info but got unidentified voicemail. Left message to call [facility] back. Scheduled appt on ATC clinic for tomorrow.  Ples Specter, RN [facility] RN)

## 2018-08-11 ENCOUNTER — Ambulatory Visit (INDEPENDENT_AMBULATORY_CARE_PROVIDER_SITE_OTHER): Payer: Medicare Other | Admitting: Family Medicine

## 2018-08-11 ENCOUNTER — Other Ambulatory Visit: Payer: Self-pay

## 2018-08-11 VITALS — BP 118/70 | HR 93 | Temp 98.6°F | Wt 180.2 lb

## 2018-08-11 DIAGNOSIS — M25511 Pain in right shoulder: Secondary | ICD-10-CM

## 2018-08-11 MED ORDER — CYCLOBENZAPRINE HCL 10 MG PO TABS: 10.0000 mg | ORAL_TABLET | Freq: Three times a day (TID) | ORAL | 0 refills | Status: DC | PRN

## 2018-08-11 MED ORDER — NAPROXEN 500 MG PO TABS: 500.0000 mg | ORAL_TABLET | Freq: Two times a day (BID) | ORAL | 0 refills | Status: DC

## 2018-08-11 NOTE — Patient Instructions (Signed)
Cervical Radiculopathy  Cervical radiculopathy means that a nerve in the neck is pinched or bruised. This can cause pain or loss of feeling (numbness) that runs from your neck to your arm and fingers.  Follow these instructions at home:  Managing pain  ? Take over-the-counter and prescription medicines only as told by your doctor.  ? If directed, put ice on the injured or painful area.  ? Put ice in a plastic bag.  ? Place a towel between your skin and the bag.  ? Leave the ice on for 20 minutes, 2?3 times per day.  ? If ice does not help, you can try using heat. Take a warm shower or warm bath, or use a heat pack as told by your doctor.  ? You may try a gentle neck and shoulder massage.  Activity  ? Rest as needed. Follow instructions from your doctor about any activities to avoid.  ? Do exercises as told by your doctor or physical therapist.  General instructions  ? If you were given a soft collar, wear it as told by your doctor.  ? Use a flat pillow when you sleep.  ? Keep all follow-up visits as told by your doctor. This is important.  Contact a doctor if:  ? Your condition does not improve with treatment.  Get help right away if:  ? Your pain gets worse and is not controlled with medicine.  ? You lose feeling or feel weak in your hand, arm, face, or leg.  ? You have a fever.  ? You have a stiff neck.  ? You cannot control when you poop or pee (have incontinence).  ? You have trouble with walking, balance, or talking.  This information is not intended to replace advice given to you by your health care provider. Make sure you discuss any questions you have with your health care provider.  Document Released: 10/21/2011 Document Revised: 04/08/2016 Document Reviewed: 12/26/2014  Elsevier Interactive Patient Education ? 2018 [employer]

## 2018-08-11 NOTE — Progress Notes (Signed)
Subjective: Chief Complaint  Patient presents with  . Shoulder Pain     HPI: Jose Li is a 62 y.o. presenting to clinic today to discuss the following:  Right Shoulder Pain Patient states for 1 week he has right shoulder pain with numbness and tingling all the way down to his hand. This has never happened before. He did have a fall about 3 weeks ago but doesn't recall having any symptoms at that time. It does not hurt to sleep on the shoulder. He has tried Tylenol and it has not helped. He denies fever, chills, vomiting and does endorse some nausea     ROS noted in HPI.   Past Medical, Surgical, Social, and Family History Reviewed & Updated per EMR.   Pertinent Historical Findings include:   Social History   Tobacco Use  Smoking Status Current Every Day Smoker  . Packs/day: 0.50  . Years: 30.00  . Pack years: 15.00  . Types: Cigarettes  Smokeless Tobacco Never Used  Tobacco Comment   Had cut back, but stress has prompted him to smoke again    Objective: BP 118/70   Pulse 93   Temp 98.6 F (37 C) (Oral)   Wt 180 lb 3.2 oz (81.7 kg)   BMI 24.44 kg/m  Vitals and nursing notes reviewed  Physical Exam Gen: Alert and Oriented x 3, NAD HEENT: Normocephalic, atraumatic CV: RRR, no murmurs, normal S1, S2 split Resp: CTAB, no wheezing, rales, or rhonchi, comfortable work of breathing MSK: Moves all four extremities; no cervical spinous process tenderness, positive Spurling test., Negative Hawkins and empty can test, FROM in the right shoulder, negative drop arm test Ext: no clubbing, cyanosis, or edema Neuro: No gross deficits, 5/5 UE strength bilaterally with the exception of diminished grip strength on the right, gross sensation intact. Skin: warm, dry, intact, no rashes   No results found for this or any previous visit (from the past 72 hour(s)).  Assessment/Plan:  Acute pain of right shoulder Current presentation is most consistent with a cervical  radiculopathy given numbness, tingling, and radiation down his left arm all the way to his hand. Patient is already taking several medications that could help with neuropathic pain; pregabalin and citalpram. - Flexeril 10mg  TID prn to help with muscle spasms and tightness - Naproxen 500mg  BID to help with inflammation and pain - X-ray of the cervical spine and right shoulder   PATIENT EDUCATION PROVIDED: See AVS    Diagnosis and plan along with any newly prescribed medication(s) were discussed in detail with this patient today. The patient verbalized understanding and agreed with the plan. Patient advised if symptoms worsen return to clinic or ER.   Health Maintainance:   Orders Placed This Encounter  Procedures  . DG Shoulder Right    Standing Status:   Future    Number of Occurrences:   1    Standing Expiration Date:   10/12/2019    Order Specific Question:   Reason for Exam (SYMPTOM  OR DIAGNOSIS REQUIRED)    Answer:   right shoulder    Order Specific Question:   Preferred imaging location?    Answer:   [facility]    Order Specific Question:   Radiology Contrast Protocol - do NOT remove file path    Answer:   \\[email or web address]s.pdf  . DG Cervical Spine Complete    Standing Status:   Future    Number of Occurrences:   1    Standing  Expiration Date:   10/12/2019    Order Specific Question:   Reason for Exam (SYMPTOM  OR DIAGNOSIS REQUIRED)    Answer:   neck pain    Order Specific Question:   Preferred imaging location?    Answer:   [facility]    Order Specific Question:   Radiology Contrast Protocol - do NOT remove file path    Answer:   \\[email or web address]\[email or web address]\DXFluoroContrastProtocols.pdf    Meds ordered this encounter  Medications  . naproxen (NAPROSYN) 500 MG tablet    Sig: Take 1 tablet (500 mg total) by mouth 2 (two) times daily with a meal.    Dispense:  30 tablet    Refill:  0  . cyclobenzaprine (FLEXERIL) 10  MG tablet    Sig: Take 1 tablet (10 mg total) by mouth 3 (three) times daily as needed for muscle spasms.    Dispense:  30 tablet    Refill:  0     Jules Schick, DO 08/11/2018, 2:47 PM PGY-2 [facility]

## 2018-08-14 ENCOUNTER — Ambulatory Visit
Admission: RE | Admit: 2018-08-14 | Discharge: 2018-08-14 | Disposition: A | Discharge status: Home / Self Care | Payer: Medicare Other | Source: Ambulatory Visit | Attending: Family Medicine | Admitting: Family Medicine

## 2018-08-14 ENCOUNTER — Telehealth: Payer: Self-pay | Admitting: Internal Medicine

## 2018-08-14 DIAGNOSIS — S4991XA Unspecified injury of right shoulder and upper arm, initial encounter: Secondary | ICD-10-CM | POA: Diagnosis not present

## 2018-08-14 DIAGNOSIS — M25511 Pain in right shoulder: Secondary | ICD-10-CM

## 2018-08-14 DIAGNOSIS — S199XXA Unspecified injury of neck, initial encounter: Secondary | ICD-10-CM | POA: Diagnosis not present

## 2018-08-14 NOTE — Telephone Encounter (Signed)
New Message:    Patient has questions about 2 medication :cyclobenzaprine (FLEXERIL) 10 MG tablet and naproxen (NAPROSYN) 500 MG tablet

## 2018-08-14 NOTE — Telephone Encounter (Signed)
New Message:    Pt would like for you to call him please. He had an appt with his primary doctor last week.He wants to discuss with you the medicine he wants him to take.

## 2018-08-14 NOTE — Telephone Encounter (Signed)
Closing this encounter. See other telephone encounter from today for more information.

## 2018-08-14 NOTE — Telephone Encounter (Signed)
Patient has been prescribed flexeril and naproxen for pain in should/neck and now down in arm to hand for pain after one of his falls.  He has been taking both and feels fine but wanted to confirm that there aren't any interactions with his other meds.  Pt aware I am forwarding to PharmD and to Dr. Tenny Craw and I will call him back if there is any concern.  Otherwise, he will continue to take all as prescribed.

## 2018-08-15 NOTE — Telephone Encounter (Signed)
No history of ASCVD and not on any other anticoagulants, ok to use both meds as needed.

## 2018-08-16 ENCOUNTER — Telehealth: Payer: Self-pay

## 2018-08-16 NOTE — Telephone Encounter (Signed)
Pt LVM on nurse line asking for his imaging results.

## 2018-08-17 DIAGNOSIS — M25511 Pain in right shoulder: Secondary | ICD-10-CM | POA: Insufficient documentation

## 2018-08-17 NOTE — Telephone Encounter (Signed)
Pt calling again to check status. Fleeger, Jessica Dawn, CMA  

## 2018-08-17 NOTE — Telephone Encounter (Signed)
Will forward to the provider who saw the patient and ordered imaging

## 2018-08-18 NOTE — Assessment & Plan Note (Addendum)
Current presentation is most consistent with a cervical radiculopathy given numbness, tingling, and radiation down his left arm all the way to his hand. Patient is already taking several medications that could help with neuropathic pain; pregabalin and citalpram. - Flexeril 10mg  TID prn to help with muscle spasms and tightness - Naproxen 500mg  BID to help with inflammation and pain - X-ray of the cervical spine and right shoulder

## 2018-08-21 ENCOUNTER — Telehealth: Payer: Self-pay

## 2018-08-21 ENCOUNTER — Other Ambulatory Visit: Payer: Self-pay | Admitting: Family Medicine

## 2018-08-21 MED ORDER — PREDNISONE 10 MG PO TABS: ORAL_TABLET | ORAL | 0 refills | Status: DC

## 2018-08-21 NOTE — Telephone Encounter (Signed)
Pt calling to let Dr. Karen Chafe know that the shoulder, arm and hand are not better and would like to steroid called in as discussed. Please call pt on [phone] if you have questions. Sunday Spillers, CMA

## 2018-08-21 NOTE — Progress Notes (Signed)
Patient called and is still having severe pain from what I believe is cervical radiculopathy. I am sending in a short course of Prednisone 60mg  for 5 days followed by a taper of 10 days. If no relief in 5 days patient should return to clinic for reevaluation.

## 2018-08-22 ENCOUNTER — Other Ambulatory Visit: Payer: Self-pay | Admitting: Family Medicine

## 2018-08-22 MED ORDER — PREDNISONE 10 MG PO TABS: ORAL_TABLET | ORAL | 0 refills | Status: AC

## 2018-08-22 NOTE — Progress Notes (Signed)
Printing prescription

## 2018-08-22 NOTE — Telephone Encounter (Signed)
Pt informed his prescription is ready for pick up.

## 2018-09-04 ENCOUNTER — Telehealth: Payer: Self-pay

## 2018-09-04 NOTE — Telephone Encounter (Signed)
Patient left message on nurse line that he completed the course of Prednisone prescribed. States his about "half better" with improvement in his arm and shoulder pain.  He thinks that if he could have one more course of Prednisone it may help completely. He is requesting a refill.  Callback is [phone]  Ples Specter, RN [facility] RN)

## 2018-09-05 NOTE — Telephone Encounter (Signed)
Called patient and explained to him per Dr. Karen Chafe that Prednisone would not be refilled and why. Patients states that he understands and  does not want to make an appointment at this time.  Jose Li, CMA

## 2018-09-12 DIAGNOSIS — L03031 Cellulitis of right toe: Secondary | ICD-10-CM | POA: Diagnosis not present

## 2018-10-23 ENCOUNTER — Ambulatory Visit: Payer: Medicare Other | Admitting: Nurse Practitioner

## 2018-10-23 NOTE — Progress Notes (Signed)
[facility]  PATIENT: Jose Li DOB: January 01, 1956   REASON FOR VISIT: follow up for epilepsy, hx of orthostatic hypotension, OSA HISTORY FROM:patient   HISTORY OF PRESENT ILLNESS:Jose Li is a 62 year old right-handed white male with a history of alcohol abuse and a history of seizures. The patient also has orthostatic hypotension, treated with Midodrine by Dr. Mayford Knifeurner. Marland Kitchen. He also has obstructive sleep apnea treated with BiPAP by Dr. Mayford Knifeurner. The patient is on Keppra taking the 750 mg tablets, 2 tablets twice daily. The patient has not had any seizures since last seen. The patient is tolerating the Keppra quite well.  Patient is also on Lyrica 75 mg twice daily.  He has not had any blackouts associated with orthostatic hypotension.  He was seen in the emergency room 08/10/2017 for accidental overdose.  Father is now administering his medications.  He is not operating a motor vehicle.He returns for reevaluation   REVIEW OF SYSTEMS: Full 14 system review of systems performed and notable only for those listed, all others are neg:  Constitutional: fatigue  Cardiovascular: neg Ear/Nose/Throat: neg  Skin: neg Eyes: neg Respiratory: neg Gastroitestinal: neg  Hematology/Lymphatic: neg  Endocrine: neg Musculoskeletal:neg Allergy/Immunology: neg Neurological: Seizure disorder Psychiatric: depression anxiety Sleep : obstructive sleep apnea with Bipap   ALLERGIES: Allergies  Allergen Reactions  . Duloxetine Other (See Comments)    Caused hyponatremia  . Erythromycin Other (See Comments)    Unknown reaction. The patient can not remember  . Hctz [Hydrochlorothiazide]     "Makes him faint" per patient Dr. Tenny Crawoss ([place]) took him off  . Propranolol     "Makes him faint" per patient Dr. Tenny Crawoss ([place]) took him off      HOME MEDICATIONS: Outpatient Medications Prior to Visit  Medication Sig Dispense Refill  . acetaminophen (TYLENOL 8 HOUR) 650 MG CR tablet Take 1  tablet (650 mg total) by mouth every 8 (eight) hours as needed for pain. 90 tablet 11  . aspirin 81 MG EC tablet Take 81 mg by mouth daily.      Marland Kitchen. atorvastatin (LIPITOR) 20 MG tablet Take 1 tablet (20 mg total) by mouth daily. 90 tablet 3  . benztropine (COGENTIN) 1 MG tablet Take 1 mg by mouth daily.     . busPIRone (BUSPAR) 5 MG tablet Take 5 mg by mouth 2 (two) times daily.     . cetirizine (ZYRTEC) 10 MG tablet Take 1 tablet (10 mg total) by mouth daily. 90 tablet 3  . citalopram (CELEXA) 20 MG tablet Take 20 mg by mouth daily.    . cyclobenzaprine (FLEXERIL) 10 MG tablet Take 1 tablet (10 mg total) by mouth 3 (three) times daily as needed for muscle spasms. 30 tablet 0  . levETIRAcetam (KEPPRA) 750 MG tablet Take 2 tablets (1,500 mg total) by mouth 2 (two) times daily. TAKE TWO TABLETS BY MOUTH TWICE DAILY. 360 tablet 3  . midodrine (PROAMATINE) 5 MG tablet TAKE 2 TABLETS BY MOUTH AT 6AM, THEN 1 TABLET AT 10 AM AND 1 TABLET AT 2 PM DAILY 360 tablet 2  . Multiple Vitamin (MULTIVITAMIN) tablet Take 1 tablet by mouth daily.    . naproxen (NAPROSYN) 500 MG tablet Take 1 tablet (500 mg total) by mouth 2 (two) times daily with a meal. 30 tablet 0  . pindolol (VISKEN) 5 MG tablet Take 0.5 tablets (2.5 mg total) by mouth 2 (two) times daily. 90 tablet 2  . potassium chloride SA (K-DUR,KLOR-CON) 20 MEQ  tablet TAKE 2 TABLETS BY MOUTH IN THE MORNING AND 1 IN THE EVENING. 270 tablet 3  . pregabalin (LYRICA) 75 MG capsule Take 1 capsule (75 mg total) by mouth 2 (two) times daily. 60 capsule 5  . risperiDONE (RISPERDAL) 2 MG tablet Take 2 mg by mouth at bedtime.     . thiamine 100 MG tablet Take 100 mg by mouth daily.      . traZODone (DESYREL) 150 MG tablet Take 150 mg by mouth at bedtime.      No facility-administered medications prior to visit.     PAST MEDICAL HISTORY: Past Medical History:  Diagnosis Date  . Acute kidney injury (HCC) 05/26/2018  . Allergy   . Anemia   . Anxiety   . Depression    . Hyperlipidemia   . Hypertension, essential, benign 08/02/2012  . Orthostatic hypotension 10/09/2013  . OSA (obstructive sleep apnea) 10/01/2015   Severe OSA with an AHI of 86/hr now on BiPAP at 23/19cm H2O  . Seizures (HCC)    none for last 6-30months  . Substance abuse (HCC)     PAST SURGICAL HISTORY: Past Surgical History:  Procedure Laterality Date  . FRACTURE SURGERY  1990   right hip and femur  . HERNIA REPAIR  07/2009   umbilical hernia/ got infected  . JOINT REPLACEMENT Right 1990   right hip  . TRACHEOSTOMY  08/1999   Had pneumoniaand was in a coma 10 days    FAMILY HISTORY: Family History  Problem Relation Age of Onset  . Heart disease Mother   . Diabetes Father   . Heart disease Brother   . Hypertension Neg Hx   . Stroke Neg Hx     SOCIAL HISTORY: Social History   Socioeconomic History  . Marital status: Single    Spouse name: Not on file  . Number of children: Not on file  . Years of education: Not on file  . Highest education level: Not on file  Occupational History  . Occupation: diability  Social Needs  . Financial resource strain: Not on file  . Food insecurity:    Worry: Not on file    Inability: Not on file  . Transportation needs:    Medical: Not on file    Non-medical: Not on file  Tobacco Use  . Smoking status: Current Every Day Smoker    Packs/day: 0.50    Years: 30.00    Pack years: 15.00    Types: Cigarettes  . Smokeless tobacco: Never Used  . Tobacco comment: Had cut back, but stress has prompted him to smoke again  Substance and Sexual Activity  . Alcohol use: No    Comment: history of alcohol abuse.  . Drug use: No  . Sexual activity: Not Currently  Lifestyle  . Physical activity:    Days per week: Not on file    Minutes per session: Not on file  . Stress: Not on file  Relationships  . Social connections:    Talks on phone: Not on file    Gets together: Not on file    Attends religious service: Not on file     Active member of club or organization: Not on file    Attends meetings of clubs or organizations: Not on file    Relationship status: Not on file  . Intimate partner violence:    Fear of current or ex partner: Not on file    Emotionally abused: Not on file    Physically  abused: Not on file    Forced sexual activity: Not on file  Other Topics Concern  . Not on file  Social History Narrative   Lives with 65 year old Father, Jose Li. Watches TV for fun. Likes Social worker and order.      His only son lives with his mother in [place], and attends [employer].      PHYSICAL EXAM  Vitals:   10/24/18 0955  BP: 115/65  Pulse: 78  Weight: 184 lb 3.2 oz (83.6 kg)  Height: 6' (1.829 m)   Body mass index is 24.98 kg/m. General: The patient is alert and cooperative at the time of the examination. . Skin: No significant peripheral edema is noted.  Neurologic Exam Mental status: The patient is oriented x 3. Cranial nerves: Facial symmetry is present. Speech is normal, no aphasia or dysarthria is noted. Extraocular movements are full. Visual fields are full. Motor: The patient has good strength in all 4 extremities.no focal weakness Sensory examination: Soft touch sensation is symmetric on the face, arms, and legs. Coordination: The patient has good finger-nose-finger and heel-to-shin bilaterally. Gait and station: The patient has a normal gait. Tandem gait is steady. Romberg is negative. No drift is seen. Reflexes: Deep tendon reflexes are symmetric.     DIAGNOSTIC DATA (LABS, IMAGING, TESTING) - I reviewed patient records, labs, notes, testing and imaging myself where available.  Lab Results  Component Value Date   WBC 7.6 08/10/2017   HGB 12.6 (L) 08/10/2017   HCT 37.8 (L) 08/10/2017   MCV 84.0 08/10/2017   PLT 264 08/10/2017      Component Value Date/Time   NA 143 07/27/2018 1154   K 4.9 07/27/2018 1154   CL 111 (H) 07/27/2018 1154   CO2 20 07/27/2018 1154   GLUCOSE 92  07/27/2018 1154   GLUCOSE 89 08/10/2017 1533   BUN 5 (L) 07/27/2018 1154   CREATININE 1.41 (H) 07/27/2018 1154   CREATININE 0.94 06/10/2016 1613   CALCIUM 9.4 07/27/2018 1154   PROT 6.6 01/23/2018 1022   ALBUMIN 4.3 01/23/2018 1022   AST 21 01/23/2018 1022   ALT 31 01/23/2018 1022   ALKPHOS 55 01/23/2018 1022   BILITOT 0.3 01/23/2018 1022   GFRNONAA 54 (L) 07/27/2018 1154   GFRNONAA 84 02/17/2016 1011   GFRAA 62 07/27/2018 1154   GFRAA >89 02/17/2016 1011   Lab Results  Component Value Date   CHOL 118 02/02/2018   HDL 29 (L) 02/02/2018   LDLCALC 57 02/02/2018   TRIG 161 (H) 02/02/2018   CHOLHDL 4.1 02/02/2018       ASSESSMENT AND PLAN  62 y.o. year old male  has a past medical history of  Substance abuse; Seizures (HCC); Hypertension, essential, benign (08/02/2012); Orthostatic hypotension (10/09/2013); and OSA (obstructive sleep apnea) (10/01/2015). Here to follow-up.  No seizures in over 2 years.    Continue Keppra at current dose will refill Continue Lyrica at current dose will refill Call for seizure activity F/U yearly and prn Nilda Riggs, [facility], [facility], APRN  [facility] [address] [place], [place] [address] [phone]

## 2018-10-24 ENCOUNTER — Ambulatory Visit: Payer: Medicare Other | Admitting: Nurse Practitioner

## 2018-10-24 ENCOUNTER — Encounter: Payer: Self-pay | Admitting: Nurse Practitioner

## 2018-10-24 VITALS — BP 115/65 | HR 78 | Ht 72.0 in | Wt 184.2 lb

## 2018-10-24 DIAGNOSIS — G40909 Epilepsy, unspecified, not intractable, without status epilepticus: Secondary | ICD-10-CM | POA: Diagnosis not present

## 2018-10-24 MED ORDER — LEVETIRACETAM 750 MG PO TABS: 1500.0000 mg | ORAL_TABLET | Freq: Two times a day (BID) | ORAL | 3 refills | Status: DC

## 2018-10-24 MED ORDER — PREGABALIN 75 MG PO CAPS: 75.0000 mg | ORAL_CAPSULE | Freq: Two times a day (BID) | ORAL | 5 refills | Status: DC

## 2018-10-24 NOTE — Progress Notes (Signed)
Fax confirmation received for lyrica [facility] [phone] sy

## 2018-10-24 NOTE — Progress Notes (Signed)
I have read the note, and I agree with the clinical assessment and plan.  Jose Li   

## 2018-10-24 NOTE — Patient Instructions (Signed)
Continue Keppra at current dose will refill Continue Lyrica at current dose Call for seizure activity F/U yearly and prn  

## 2018-11-27 ENCOUNTER — Other Ambulatory Visit: Payer: Self-pay

## 2018-11-27 ENCOUNTER — Ambulatory Visit (INDEPENDENT_AMBULATORY_CARE_PROVIDER_SITE_OTHER): Payer: Medicare Other | Admitting: Student in an Organized Health Care Education/Training Program

## 2018-11-27 ENCOUNTER — Encounter: Payer: Self-pay | Admitting: Student in an Organized Health Care Education/Training Program

## 2018-11-27 VITALS — BP 110/60 | HR 87 | Temp 98.1°F | Ht 72.0 in | Wt 186.8 lb

## 2018-11-27 DIAGNOSIS — N179 Acute kidney failure, unspecified: Secondary | ICD-10-CM

## 2018-11-27 NOTE — Patient Instructions (Addendum)
It was a pleasure seeing you today in our clinic. Here is the treatment plan we have discussed and agreed upon together:  We drew blood work at today's visit. I will call or send you a letter with these results. If you do not hear from me within the next week, please give our office a call.  Our clinic's number is [phone]. Please call with questions or concerns about what we discussed today.  Be well, Dr. Lillis Nuttle   

## 2018-11-27 NOTE — Progress Notes (Signed)
   CC: Previous AKI, presents for labs.  HPI: Jose Li is a 63 y.o. male presenting for repeat labs.  Patient has a history of AKI. He previously underwent renal ultrasound which was normal except for a small simple cyst. He was previously advised to discontinue NSAIDs and it was thought that creatinine may be a new baseline for him. He has been lost to follow up for several months and comes back in today for repeat BMP.  He denies headaches, dizziness, confusion. No N/V/D/C. No dysuria or urinary frequency or urgency. No hematuria.  Review of Symptoms:  See HPI for ROS.   CC, SH/smoking status, and VS noted.  Objective: BP 110/60   Pulse 87   Temp 98.1 F (36.7 C) (Oral)   Ht 6' (1.829 m)   Wt 186 lb 12.8 oz (84.7 kg)   SpO2 98%   BMI 25.33 kg/m  GEN: NAD, alert, cooperative, and pleasant. RESPIRATORY: Comfortable work of breathing, speaks in full sentences CV: Regular rate noted, distal extremities well perfused and warm without edema GI: Soft, nondistended SKIN: warm and dry, no rashes or lesions NEURO: II-XII grossly intact MSK: Moves 4 extremities equally PSYCH: AAOx3, appropriate affect   Assessment and plan:  Acute kidney injury (HCC) Ordered repeat BMP today. If it is stable, we can monitor every 6-12 months. If it is significantly worsened he may require renal consult. Med list reviewed for nephrotoxic agents, none identified.   Orders Placed This Encounter  Procedures  . Basic Metabolic Panel    No orders of the defined types were placed in this encounter.    Howard Pouch, MD,MS,  PGY3 11/28/2018 1:36 PM

## 2018-11-28 LAB — BASIC METABOLIC PANEL
BUN/Creatinine Ratio: 6 — ABNORMAL LOW (ref 10–24)
BUN: 9 mg/dL (ref 8–27)
CO2: 16 mmol/L — AB (ref 20–29)
CREATININE: 1.49 mg/dL — AB (ref 0.76–1.27)
Calcium: 9.1 mg/dL (ref 8.6–10.2)
Chloride: 107 mmol/L — ABNORMAL HIGH (ref 96–106)
GFR, EST AFRICAN AMERICAN: 58 mL/min/{1.73_m2} — AB (ref >59)
GFR, EST NON AFRICAN AMERICAN: 50 mL/min/{1.73_m2} — AB (ref >59)
Glucose: 94 mg/dL (ref 65–99)
Potassium: 4.5 mmol/L (ref 3.5–5.2)
Sodium: 141 mmol/L (ref 134–144)

## 2018-11-28 NOTE — Assessment & Plan Note (Addendum)
Ordered repeat BMP today. If it is stable, we can monitor every 6-12 months. If it is significantly worsened he may require renal consult. Med list reviewed for nephrotoxic agents, none identified.

## 2018-12-28 ENCOUNTER — Other Ambulatory Visit: Payer: Self-pay

## 2018-12-29 MED ORDER — CETIRIZINE HCL 10 MG PO TABS: 10.0000 mg | ORAL_TABLET | Freq: Every day | ORAL | 3 refills | Status: DC

## 2019-01-01 IMAGING — CT CT CHEST W/O CM
1 series · 15 of 34 positions shown, 19 images · non-contrast
Comparison: 01/28/2016

CLINICAL DATA: Evaluate for lung cancer. Smoker. Asymptomatic.
Twenty-three pack-year history.

EXAM:
CT CHEST WITHOUT CONTRAST
TECHNIQUE: Multidetector CT imaging of the chest was performed following the
standard protocol without IV contrast.

[Series 2: chest w/(date) · axial · 0.72mm/px · z∈[-334,-66]mm · 15 of 158 slices shown, 19 images]
[im 12/158  mediastinal]
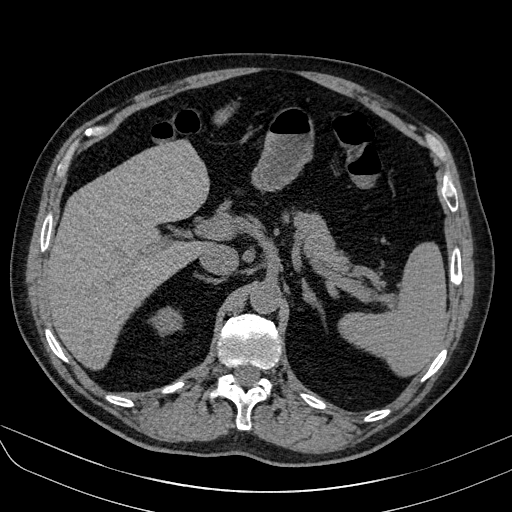
[im 12/158  lung]
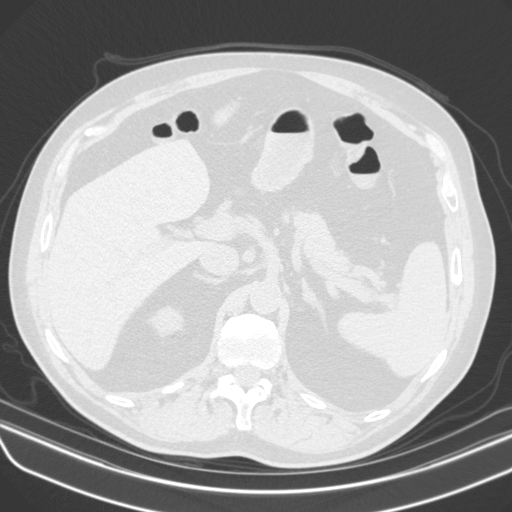
[im 24/158  lung]
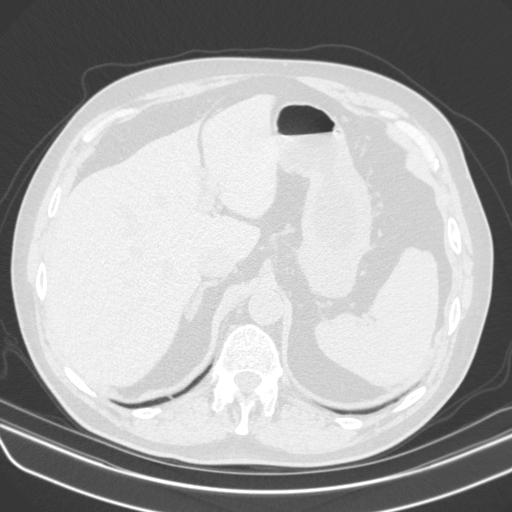
[im 32/158  lung]
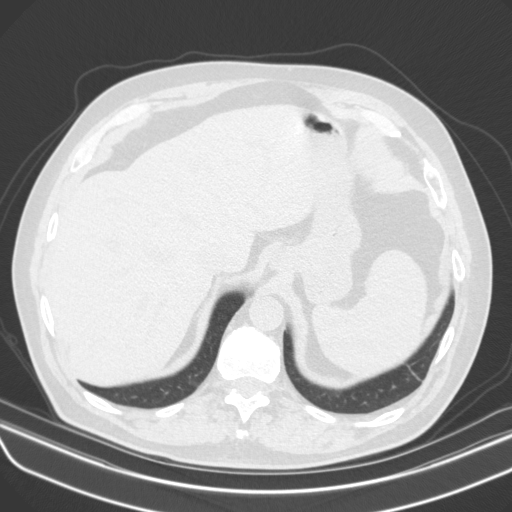
[im 41/158  lung]
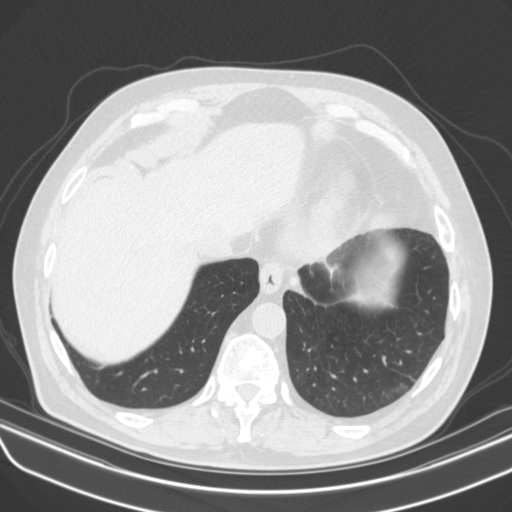
[im 53/158  mediastinal]
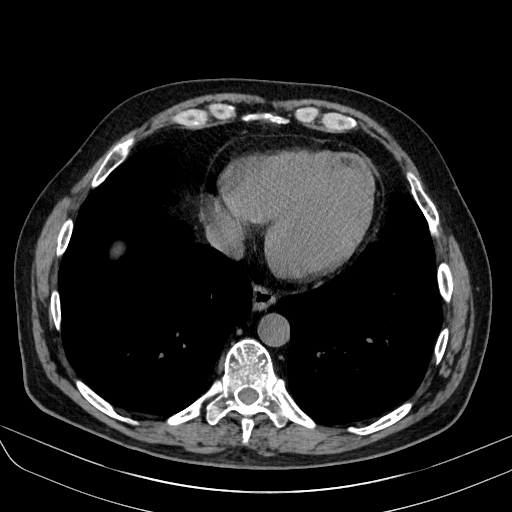
[im 53/158  lung]
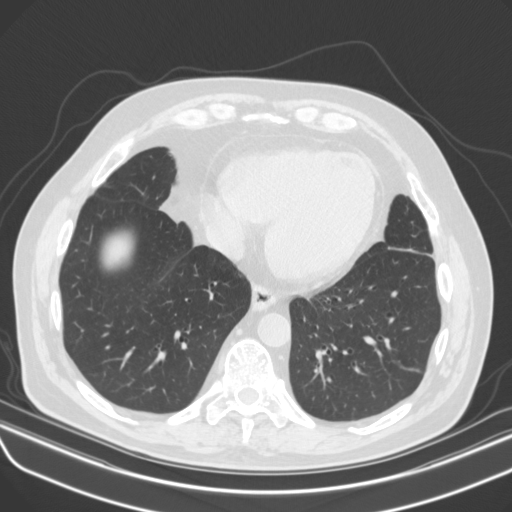
[im 63/158  lung]
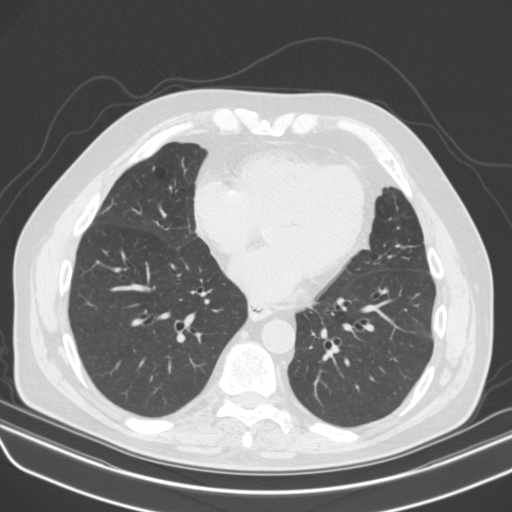
[im 70/158  lung]
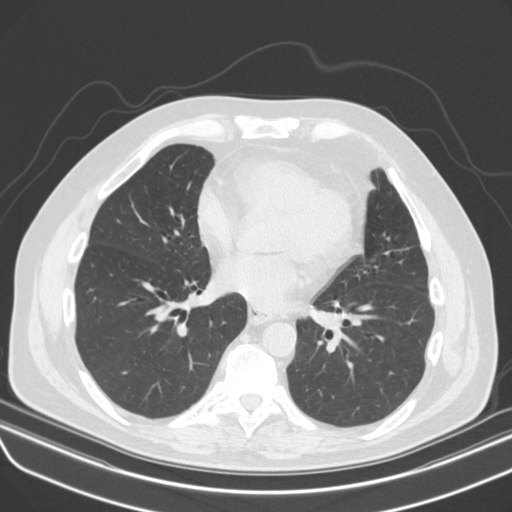
[im 82/158  lung]
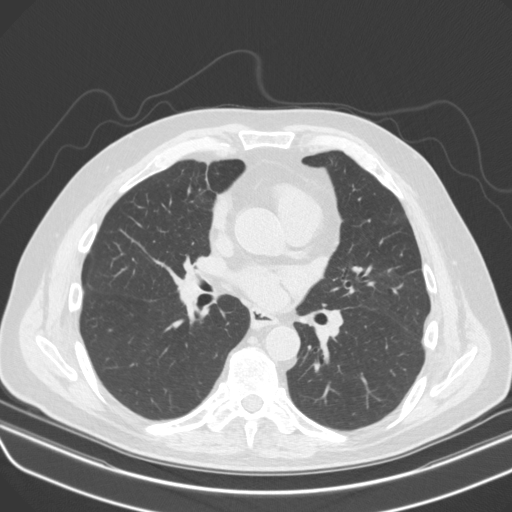
[im 88/158  mediastinal]
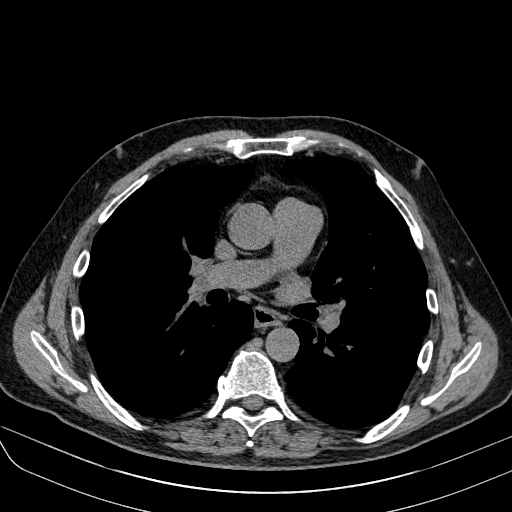
[im 88/158  lung]
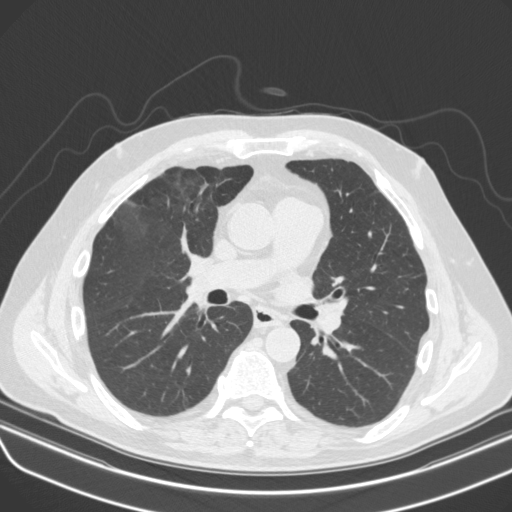
[im 95/158  lung]
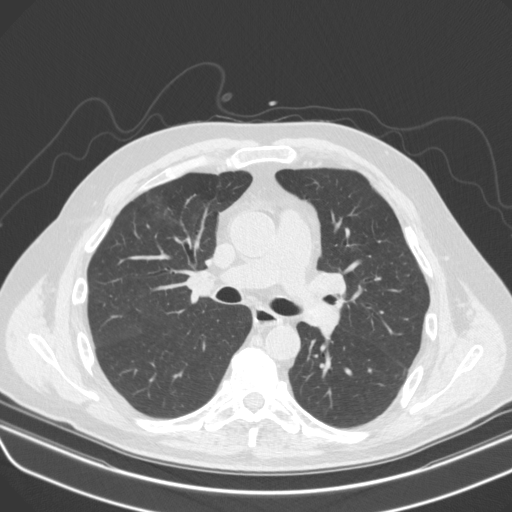
[im 105/158  lung]
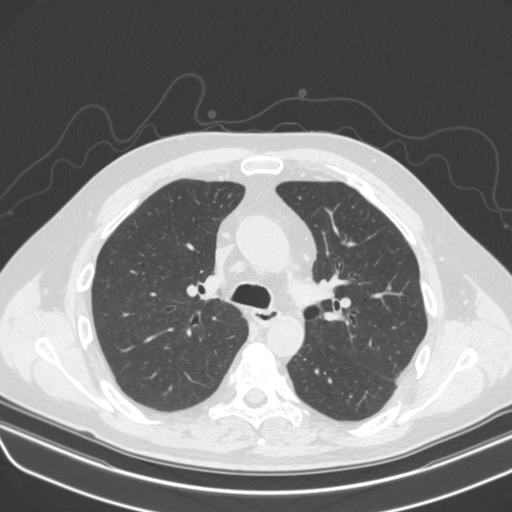
[im 117/158  lung]
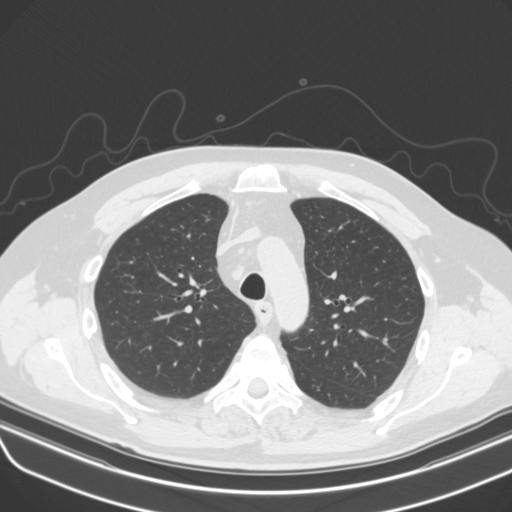
[im 126/158  mediastinal]
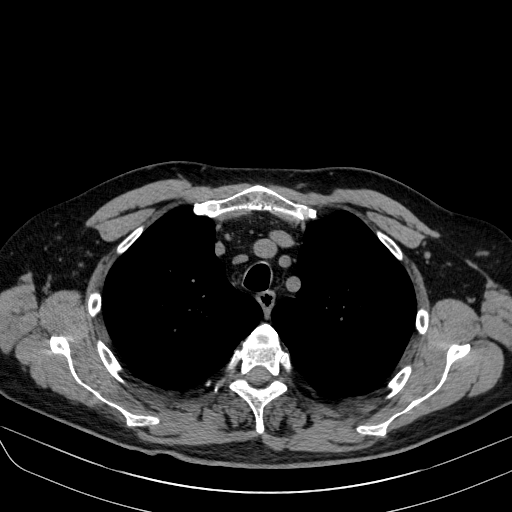
[im 126/158  lung]
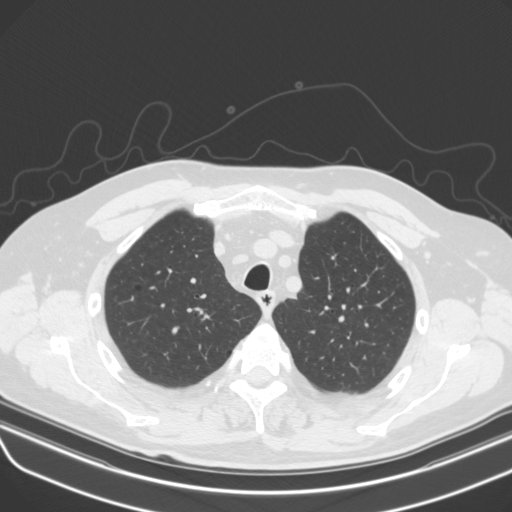
[im 134/158  lung]
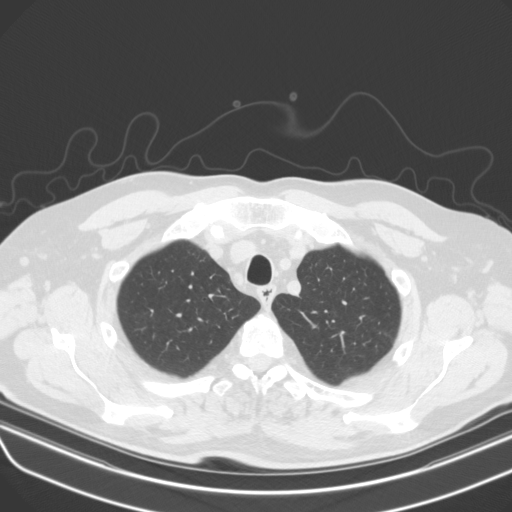
[im 146/158  lung]
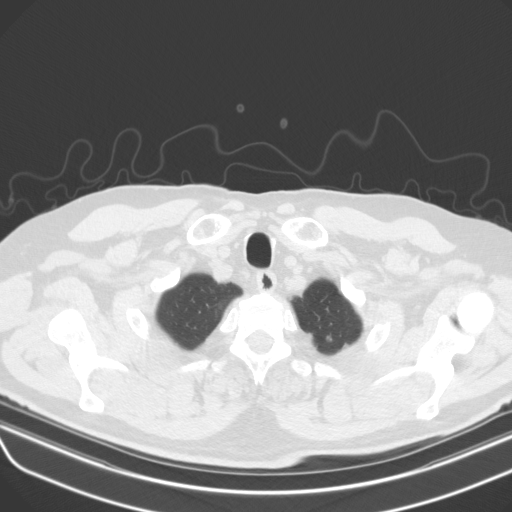

[15 of 34 positions shown; findings below may reference images not displayed]

FINDINGS: Cardiovascular: The heart size appears normal. No pericardial
effusion identified. Mild aortic atherosclerosis. Calcification in
the LAD coronary artery.

Mediastinum/Nodes: Normal appearance of the thyroid gland. The
trachea appears patent and is midline. Normal appearance of the
esophagus. No enlarged mediastinal or hilar lymph nodes.

Lungs/Pleura: No pleural effusion. No pneumothorax or airspace
consolidation. Mild diffuse bronchial wall thickening. Changes of
centrilobular emphysema identified. Nodule in the right lower lobe
measures 4 mm, image 76/5. Unchanged from previous exam. 4 mm right
lower lobe lung nodule is identified, image 73/5. Unchanged from
previous exam.

Upper Abdomen: No acute abnormality.

Musculoskeletal: Degenerative disc disease identified within the
thoracic spine. No aggressive lytic or sclerotic bone lesions.
IMPRESSION: 1. Stable small pulmonary nodules in the right lower lobe measuring
4 mm. No new pulmonary nodules identified.
2. Aortic Atherosclerosis (1ASHK-BJ8.8) and Emphysema (1ASHK-RPA.T).
3. LAD coronary artery calcification.

## 2019-01-04 DIAGNOSIS — B351 Tinea unguium: Secondary | ICD-10-CM | POA: Diagnosis not present

## 2019-01-04 DIAGNOSIS — L600 Ingrowing nail: Secondary | ICD-10-CM | POA: Diagnosis not present

## 2019-01-10 DIAGNOSIS — H5203 Hypermetropia, bilateral: Secondary | ICD-10-CM | POA: Diagnosis not present

## 2019-01-10 DIAGNOSIS — H2513 Age-related nuclear cataract, bilateral: Secondary | ICD-10-CM | POA: Diagnosis not present

## 2019-01-10 DIAGNOSIS — H524 Presbyopia: Secondary | ICD-10-CM | POA: Diagnosis not present

## 2019-01-12 ENCOUNTER — Ambulatory Visit: Payer: Medicare Other | Admitting: Internal Medicine

## 2019-01-12 ENCOUNTER — Encounter: Payer: Self-pay | Admitting: Internal Medicine

## 2019-01-12 VITALS — BP 110/62 | HR 63 | Ht 72.0 in | Wt 182.6 lb

## 2019-01-12 DIAGNOSIS — E782 Mixed hyperlipidemia: Secondary | ICD-10-CM | POA: Diagnosis not present

## 2019-01-12 DIAGNOSIS — I951 Orthostatic hypotension: Secondary | ICD-10-CM

## 2019-01-12 DIAGNOSIS — G90A Postural orthostatic tachycardia syndrome (POTS): Secondary | ICD-10-CM

## 2019-01-12 DIAGNOSIS — R000 Tachycardia, unspecified: Secondary | ICD-10-CM | POA: Diagnosis not present

## 2019-01-12 LAB — LIPID PANEL
CHOLESTEROL TOTAL: 137 mg/dL (ref 100–199)
Chol/HDL Ratio: 4 ratio (ref 0.0–5.0)
HDL: 34 mg/dL — AB (ref >39)
LDL Calculated: 73 mg/dL (ref 0–99)
Triglycerides: 152 mg/dL — ABNORMAL HIGH (ref 0–149)
VLDL CHOLESTEROL CAL: 30 mg/dL (ref 5–40)

## 2019-01-12 MED ORDER — MIDODRINE HCL 5 MG PO TABS: ORAL_TABLET | ORAL | 3 refills | Status: DC

## 2019-01-12 NOTE — Patient Instructions (Addendum)
Medication Instructions:  Your physician has recommended you make the following change in your medication:  1.) CHANGE MIDODRINE TO ONE TABLET 3 TIMES A DAY (6am, 10am and 2pm)      (Call if you feel worse)  If you need a refill on your cardiac medications before your next appointment, please call your pharmacy.   Lab work: TODAY: WE WILL CHECK YOUR LIPIDS.     If you have labs (blood work) drawn today and your tests are completely normal, you will receive your results only by: Marland Kitchen MyChart Message (if you have MyChart) OR . A paper copy in the mail If you have any lab test that is abnormal or we need to change your treatment, we will call you to review the results.  Testing/Procedures: NONE  Follow-Up: At [facility], you and your health needs are our priority.  As part of our continuing mission to provide you with exceptional heart care, we have created designated Provider Care Teams.  These Care Teams include your primary Cardiologist (physician) and Advanced Practice Providers (APPs -  Physician Assistants and Nurse Practitioners) who all work together to provide you with the care you need, when you need it. You will need a follow up appointment in:  (joct) 8 months.  Please call our office 2 months in advance to schedule this appointment.  You may see Dietrich Pates, MD or one of the following Advanced Practice Providers on your designated Care Team: Tereso Newcomer, PA-C [place], [place] . Berton Bon, NP  Any Other Special Instructions Will Be Listed Below (If Applicable).  ADDENDUM:  NEED CBC COLLECTED.  LEFT PATIENT MESSAGE TO RETURN TO OFFICE TO HAVE CBC DRAWN TODAY, OR CALL BACK TO SET UP DIFFERENT DAY/mw

## 2019-01-12 NOTE — Progress Notes (Signed)
Cardiology Office Note   Date:  01/12/2019   ID:  Jose Li, DOB [birth year], MRN [id number]  PCP:  Howard Pouch, MD  Cardiologist:   Dietrich Pates, MD   F/U of dizziness, orthostatic intolerance    History of Present Illness: Jose Li is a 63 y.o. male with a history of dizziness, syncope, autonomic dysfuncton   I last saw him in clinic in  July 2019  Prior to that he had 2 syncopal spells.  Since I saw him in July he is felt okay he has only occasional dizziness.  He  has had no syncope.  He denies chest pain breathing is okay no palpitations.   Current Meds  Medication Sig  . acetaminophen (TYLENOL 8 HOUR) 650 MG CR tablet Take 1 tablet (650 mg total) by mouth every 8 (eight) hours as needed for pain.  Marland Kitchen aspirin 81 MG EC tablet Take 81 mg by mouth daily.    Marland Kitchen atorvastatin (LIPITOR) 20 MG tablet Take 1 tablet (20 mg total) by mouth daily.  . benztropine (COGENTIN) 1 MG tablet Take 1 mg by mouth daily.   . busPIRone (BUSPAR) 5 MG tablet Take 5 mg by mouth 2 (two) times daily.   . cetirizine (ZYRTEC) 10 MG tablet Take 1 tablet (10 mg total) by mouth daily.  . citalopram (CELEXA) 20 MG tablet Take 20 mg by mouth daily.  . cyclobenzaprine (FLEXERIL) 10 MG tablet Take 1 tablet (10 mg total) by mouth 3 (three) times daily as needed for muscle spasms.  Marland Kitchen levETIRAcetam (KEPPRA) 750 MG tablet Take 2 tablets (1,500 mg total) by mouth 2 (two) times daily. TAKE TWO TABLETS BY MOUTH TWICE DAILY.  . midodrine (PROAMATINE) 5 MG tablet TAKE 2 TABLETS BY MOUTH AT 6AM, THEN 1 TABLET AT 10 AM AND 1 TABLET AT 2 PM DAILY  . Multiple Vitamin (MULTIVITAMIN) tablet Take 1 tablet by mouth daily.  . pindolol (VISKEN) 5 MG tablet Take 0.5 tablets (2.5 mg total) by mouth 2 (two) times daily.  . potassium chloride SA (K-DUR,KLOR-CON) 20 MEQ tablet TAKE 2 TABLETS BY MOUTH IN THE MORNING AND 1 IN THE EVENING.  . pregabalin (LYRICA) 75 MG capsule Take 1 capsule (75 mg total) by mouth 2 (two) times daily.  .  risperiDONE (RISPERDAL) 2 MG tablet Take 2 mg by mouth at bedtime.   . thiamine 100 MG tablet Take 100 mg by mouth daily.    . traZODone (DESYREL) 150 MG tablet Take 150 mg by mouth at bedtime.      Allergies:   Duloxetine; Erythromycin; Hctz [hydrochlorothiazide]; and Propranolol   Past Medical History:  Diagnosis Date  . Acute kidney injury (HCC) 05/26/2018  . Allergy   . Anemia   . Anxiety   . Depression   . Hyperlipidemia   . Hypertension, essential, benign 08/02/2012  . Orthostatic hypotension 10/09/2013  . OSA (obstructive sleep apnea) 10/01/2015   Severe OSA with an AHI of 86/hr now on BiPAP at 23/19cm H2O  . Seizures (HCC)    none for last 6-22months  . Substance abuse [facility])     Past Surgical History:  Procedure Laterality Date  . FRACTURE SURGERY  1990   right hip and femur  . HERNIA REPAIR  07/2009   umbilical hernia/ got infected  . JOINT REPLACEMENT Right 1990   right hip  . TRACHEOSTOMY  08/1999   Had pneumoniaand was in a coma 10 days     Social History:  The  patient  reports that he has been smoking cigarettes. He has a 15.00 pack-year smoking history. He has never used smokeless tobacco. He reports that he does not drink alcohol or use drugs.   Family History:  The patient's family history includes Diabetes in his father; Heart disease in his brother and mother.    ROS:  Please see the history of present illness. All other systems are reviewed and  Negative to the above problem except as noted.    PHYSICAL EXAM: VS:  BP 110/62   Pulse 63   Ht 6' (1.829 m)   Wt 182 lb 9.6 oz (82.8 kg)   SpO2 97%   BMI 24.77 kg/m   GEN: NAD   HEENT: normal  Neck:   JVP is not elevated    No  carotid bruits, or masses Cardiac: RRR; no murmurs, rubs, or gallops,no edema Varicoasities in legs   Respiratory:  clear to auscultation bilaterally, normal work of breathing GI: soft, Mild diffuse tenderness  No masses , nondistended, + BS  No hepatomegaly  MS: no deformity  Moving all extremities   Skin: warm and dry, no rash    EKG:  EKG is not  ordered today.    Lipid Panel    Component Value Date/Time   CHOL 118 02/02/2018 0915   TRIG 161 (H) 02/02/2018 0915   HDL 29 (L) 02/02/2018 0915   CHOLHDL 4.1 02/02/2018 0915   CHOLHDL 5.3 (H) 12/29/2015 1146   VLDL 79 (H) 12/29/2015 1146   LDLCALC 57 02/02/2018 0915      Wt Readings from Last 3 Encounters:  01/12/19 182 lb 9.6 oz (82.8 kg)  11/27/18 186 lb 12.8 oz (84.7 kg)  10/24/18 184 lb 3.2 oz (83.6 kg)      ASSESSMENT AND PLAN: 1  Autonomic dysfunction   Clinically he has done well with only occasional dizziness.  I have encouraged him to stay active and to stay hydrated.  He is currently on low dose pindolol and  midodrine 10/5/5 I would recommend backing down to 5T3 times per day.  If he notices more dizziness, we would go back to 10/5/5.  2 CAD.  Plaquing noted on CT scan in the LAD.  Plan for statin and low-dose aspirin.  No symptoms.  3 dyslipidemia Will check lipids today.  Have been good.  4  Cr   Cr has been 1.3 to 1.5   Stay hydrated  5  Hypokalemia   On K supplements   COntinue  K in in Jan was normal    F/U in October    Current medicines are reviewed at length with the patient today.  The patient does not have concerns regarding medicines.  Signed, Dietrich Pates, MD  01/12/2019 9:32 AM    [facility] [address] [place], [place], [place]  [address] Phone: [phone]; Fax: [phone]

## 2019-01-15 ENCOUNTER — Other Ambulatory Visit: Payer: Self-pay | Admitting: Internal Medicine

## 2019-01-15 ENCOUNTER — Other Ambulatory Visit: Payer: Medicare Other | Admitting: *Deleted

## 2019-01-15 DIAGNOSIS — I951 Orthostatic hypotension: Secondary | ICD-10-CM | POA: Diagnosis not present

## 2019-01-15 DIAGNOSIS — R000 Tachycardia, unspecified: Secondary | ICD-10-CM | POA: Diagnosis not present

## 2019-01-15 LAB — CBC
Hematocrit: 37.1 % — ABNORMAL LOW (ref 37.5–51.0)
Hemoglobin: 12.4 g/dL — ABNORMAL LOW (ref 13.0–17.7)
MCH: 28.5 pg (ref 26.6–33.0)
MCHC: 33.4 g/dL (ref 31.5–35.7)
MCV: 85 fL (ref 79–97)
PLATELETS: 331 10*3/uL (ref 150–450)
RBC: 4.35 x10E6/uL (ref 4.14–5.80)
RDW: 12.3 % (ref 11.6–15.4)
WBC: 7.9 10*3/uL (ref 3.4–10.8)

## 2019-02-09 ENCOUNTER — Telehealth: Payer: Self-pay | Admitting: Internal Medicine

## 2019-02-09 MED ORDER — MIDODRINE HCL 5 MG PO TABS: ORAL_TABLET | ORAL | 11 refills | Status: DC

## 2019-02-09 NOTE — Telephone Encounter (Signed)
Patient reports dizzy in the mornings since reducing midodrine to 5mg  TID.Marland Kitchen Per last ov notes from Dr. Tenny Craw, instructed patient to go back on previous dose of midodrine, which was 10 mg at 6 AM, then 5 mg at 10 am and 2pm. Pt states he is staying hydrated and verbalizes understanding and agreement with this plan. To Dr. Tenny Craw to inform.

## 2019-02-09 NOTE — Telephone Encounter (Signed)
New message    Pt c/o medication issue:  1. Name of Medication:   midodrine (PROAMATINE) 5 MG tablet     2. How are you currently taking this medication (dosage and times per day)? 3 times daily  3. Are you having a reaction (difficulty breathing--STAT)? no  4. What is your medication issue?patient states that he is having dizziness with this medication. Please call to discuss.

## 2019-02-10 NOTE — Telephone Encounter (Signed)
Agree to go back to previous higher dosing

## 2019-02-14 ENCOUNTER — Other Ambulatory Visit: Payer: Self-pay | Admitting: Internal Medicine

## 2019-02-14 DIAGNOSIS — I10 Essential (primary) hypertension: Secondary | ICD-10-CM

## 2019-03-20 ENCOUNTER — Other Ambulatory Visit: Payer: Self-pay | Admitting: Internal Medicine

## 2019-03-27 ENCOUNTER — Other Ambulatory Visit: Payer: Self-pay | Admitting: Internal Medicine

## 2019-04-12 ENCOUNTER — Ambulatory Visit: Payer: Medicare Other | Admitting: Family Medicine

## 2019-04-12 ENCOUNTER — Other Ambulatory Visit: Payer: Self-pay

## 2019-04-17 ENCOUNTER — Encounter: Payer: Self-pay | Admitting: Family Medicine

## 2019-04-17 ENCOUNTER — Ambulatory Visit: Payer: Medicare Other | Admitting: Family Medicine

## 2019-04-17 ENCOUNTER — Other Ambulatory Visit: Payer: Self-pay

## 2019-04-17 VITALS — Ht 72.0 in | Wt 180.0 lb

## 2019-04-17 DIAGNOSIS — Z0000 Encounter for general adult medical examination without abnormal findings: Secondary | ICD-10-CM

## 2019-04-17 NOTE — Progress Notes (Signed)
Subjective:   Jose Li is a 63 y.o. male who presents for Medicare Annual/Subsequent preventive examination.  The patient consented to a virtual visit.   Review of Systems:  Review of Systems  Constitutional: Negative for chills, fever, malaise/fatigue and weight loss.  HENT: Negative for congestion.   Respiratory: Negative for cough.   Cardiovascular: Negative for chest pain.  Gastrointestinal: Negative for nausea and vomiting.  Musculoskeletal: Negative for myalgias.  Neurological: Negative for headaches.  Psychiatric/Behavioral: Positive for depression. Negative for suicidal ideas. The patient is nervous/anxious.     Cardiac Risk Factors include: advanced age (>2055men, 8>65 women);dyslipidemia;male gender;family history of premature cardiovascular disease;smoking/ tobacco exposure;hypertension     Objective:    Vitals: Ht 6' (1.829 m)   Wt 180 lb (81.6 kg)   BMI 24.41 kg/m   Body mass index is 24.41 kg/m.  Advanced Directives 04/17/2019 11/27/2018 07/27/2018 01/24/2017 01/24/2017 11/29/2016 07/07/2016  Does Patient Have a Medical Advance Directive? No No No No No No No  Would patient like information on creating a medical advance directive? Yes (MAU/Ambulatory/Procedural Areas - Information given) No - Patient declined No - Patient declined No - Patient declined No - Patient declined No - Patient declined No - patient declined information    Tobacco Social History   Tobacco Use  Smoking Status Current Every Day Smoker  . Packs/day: 0.50  . Years: 30.00  . Pack years: 15.00  . Types: Cigarettes  Smokeless Tobacco Never Used  Tobacco Comment   Had cut back, but stress has prompted him to smoke again     Ready to quit: Not Answered Counseling given: Not Answered Comment: Had cut back, but stress has prompted him to smoke again   Clinical Intake:  Pre-visit preparation completed: No  Pain : No/denies pain     Nutritional Status: BMI of 19-24  Normal Diabetes:  No  How often do you need to have someone help you when you read instructions, pamphlets, or other written materials from your doctor or pharmacy?: 1 - Never  Interpreter Needed?: No     Past Medical History:  Diagnosis Date  . Acute kidney injury (HCC) 05/26/2018  . Allergy   . Anemia   . Anxiety   . Depression   . Hyperlipidemia   . Hypertension, essential, benign 08/02/2012  . Orthostatic hypotension 10/09/2013  . OSA (obstructive sleep apnea) 10/01/2015   Severe OSA with an AHI of 86/hr now on BiPAP at 23/19cm H2O  . Seizures (HCC)    none for last 6-697months  . Substance abuse [facility](HCC)    Past Surgical History:  Procedure Laterality Date  . FRACTURE SURGERY  1990   right hip and femur  . HERNIA REPAIR  07/2009   umbilical hernia/ got infected  . JOINT REPLACEMENT Right 1990   right hip  . TRACHEOSTOMY  08/1999   Had pneumoniaand was in a coma 10 days   Family History  Problem Relation Age of Onset  . Heart disease Mother   . Diabetes Father   . Heart disease Brother   . Hypertension Neg Hx   . Stroke Neg Hx    Social History   Socioeconomic History  . Marital status: Single    Spouse name: Not on file  . Number of children: Not on file  . Years of education: Not on file  . Highest education level: Not on file  Occupational History  . Occupation: diability  Social Needs  . Financial resource strain: Not  on file  . Food insecurity:    Worry: Not on file    Inability: Not on file  . Transportation needs:    Medical: Not on file    Non-medical: Not on file  Tobacco Use  . Smoking status: Current Every Day Smoker    Packs/day: 0.50    Years: 30.00    Pack years: 15.00    Types: Cigarettes  . Smokeless tobacco: Never Used  . Tobacco comment: Had cut back, but stress has prompted him to smoke again  Substance and Sexual Activity  . Alcohol use: No    Comment: history of alcohol abuse.  . Drug use: No  . Sexual activity: Not Currently  Lifestyle  .  Physical activity:    Days per week: Not on file    Minutes per session: Not on file  . Stress: Not on file  Relationships  . Social connections:    Talks on phone: Not on file    Gets together: Not on file    Attends religious service: Not on file    Active member of club or organization: Not on file    Attends meetings of clubs or organizations: Not on file    Relationship status: Not on file  Other Topics Concern  . Not on file  Social History Narrative   Lives with 70 year old Father, [place] [employer]. Watches TV for fun. Likes Social worker and order.      His only son lives with his mother in [place], and attends [employer].     Outpatient Encounter Medications as of 04/17/2019  Medication Sig  . acetaminophen (TYLENOL 8 HOUR) 650 MG CR tablet Take 1 tablet (650 mg total) by mouth every 8 (eight) hours as needed for pain.  Marland Kitchen aspirin 81 MG EC tablet Take 81 mg by mouth daily.    Marland Kitchen atorvastatin (LIPITOR) 20 MG tablet Take 1 tablet by mouth once daily  . benztropine (COGENTIN) 1 MG tablet Take 1 mg by mouth daily.   . busPIRone (BUSPAR) 5 MG tablet Take 5 mg by mouth 2 (two) times daily.   . cetirizine (ZYRTEC) 10 MG tablet Take 1 tablet (10 mg total) by mouth daily.  . citalopram (CELEXA) 20 MG tablet Take 20 mg by mouth daily.  Marland Kitchen levETIRAcetam (KEPPRA) 750 MG tablet Take 2 tablets (1,500 mg total) by mouth 2 (two) times daily. TAKE TWO TABLETS BY MOUTH TWICE DAILY.  . midodrine (PROAMATINE) 5 MG tablet Take 2 tablets daily at 6 am, take 1 tablet daily at 10 am and 2pm  . Multiple Vitamin (MULTIVITAMIN) tablet Take 1 tablet by mouth daily.  . pindolol (VISKEN) 5 MG tablet Take 1/2 (one-half) tablet by mouth twice daily  . potassium chloride SA (K-DUR) 20 MEQ tablet TAKE 2 TABLETS BY MOUTH ONCE DAILY IN THE MORNING AND 1 TABLET IN THE EVENING  . pregabalin (LYRICA) 75 MG capsule Take 1 capsule (75 mg total) by mouth 2 (two) times daily.  . risperiDONE (RISPERDAL) 2 MG tablet Take 2 mg by  mouth at bedtime.   . thiamine 100 MG tablet Take 100 mg by mouth daily.    . traZODone (DESYREL) 150 MG tablet Take 150 mg by mouth at bedtime.   . cyclobenzaprine (FLEXERIL) 10 MG tablet Take 1 tablet (10 mg total) by mouth 3 (three) times daily as needed for muscle spasms. (Patient not taking: Reported on 04/17/2019)   No facility-administered encounter medications on file as of 04/17/2019.  Activities of Daily Living In your present state of health, do you have any difficulty performing the following activities: 04/17/2019  Hearing? N  Vision? Y  Difficulty concentrating or making decisions? Y  Walking or climbing stairs? N  Dressing or bathing? N  Doing errands, shopping? N  Preparing Food and eating ? N  Using the Toilet? N  In the past six months, have you accidently leaked urine? N  Do you have problems with loss of bowel control? Y  Managing your Medications? N  Managing your Finances? N  Housekeeping or managing your Housekeeping? N  Some recent data might be hidden    Patient Care Team: Howard Pouch, MD as PCP - General (Family Medicine) Pricilla Riffle, MD as PCP - Cardiology (Cardiology) York Spaniel, MD as Consulting Physician (Neurology)   Assessment:   This is a routine wellness examination for Jose Li.  Exercise Activities and Dietary recommendations Current Exercise Habits: Home exercise routine, Type of exercise: walking, Time (Minutes): 25, Frequency (Times/Week): 7, Weekly Exercise (Minutes/Week): 175, Intensity: Mild, Exercise limited by: None identified  Goals    . PHQ-9 < 5 (pt-stated)     Jose Li would like to get his depression under control. He is on medication and sees counselors at [facility]. He would like to get his PHQ-9 under 5.       Fall Risk Fall Risk  04/17/2019 11/27/2018 07/07/2016 12/29/2015 09/21/2013  Falls in the past year? 1 0 Yes Yes No  Number falls in past yr: 0 - 2 or more - -  Injury with Fall? 1 - Yes (No Data) -  Comment - - - hit  head but did not go to hospital -  Risk Factor Category  - - High Fall Risk - -  Risk for fall due to : History of fall(s);Mental status change;Impaired vision - History of fall(s);Medication side effect;Mental status change - -  Follow up Falls prevention discussed - Falls prevention discussed;Follow up appointment - -  Comment - - Seeing neurologist about falls. ER thought it was caused by Cymbalta, but dizziness persists. - -   Is the patient's home free of loose throw rugs in walkways, pet beds, electrical cords, etc?   no      Grab bars in the bathroom? no      Handrails on the stairs?   yes      Adequate lighting?   yes  Depression Screen PHQ 2/9 Scores 04/17/2019 11/27/2018 07/27/2018 05/26/2018  PHQ - 2 Score 3 0 0 0  PHQ- 9 Score 6 - - -    Cognitive Function     6CIT Screen 04/17/2019  What Year? 0 points  What month? 0 points  What time? 0 points  Count back from 20 0 points  Months in reverse 0 points  Repeat phrase 0 points  Total Score 0    Immunization History  Administered Date(s) Administered  . Influenza Split 08/18/2011, 08/02/2012  . Influenza Whole 09/06/2007, 08/21/2008, 08/22/2008  . Influenza,inj,Quad PF,6+ Mos 09/21/2013, 09/13/2014, 07/07/2016, 01/23/2018, 07/27/2018  . Pneumococcal Polysaccharide-23 12/29/2015  . Td 11/15/2005  . Tdap 01/28/2016    Screening Tests Health Maintenance  Topic Date Due  . INFLUENZA VACCINE  06/16/2019  . COLONOSCOPY  05/03/2021  . TETANUS/TDAP  01/27/2026  . Hepatitis C Screening  Completed  . HIV Screening  Completed   Cancer Screenings: Lung: Low Dose CT Chest recommended if Age 109-80 years, 30 pack-year currently smoking OR have quit w/in 15years. Patient  does qualify. Colorectal: screening up to date, will need repeat in 2022     Plan:    Patient is currently up to date on health maintenance and does not have specific goals other than to continue walking daily.  We discussed the importance of smoking  cessation, but he is in the precontemplative stage.  He needs to be sent paperwork for setting up a Medical Advance Directive.  I have personally reviewed and noted the following in the patient's chart:   . Medical and social history . Use of alcohol, tobacco or illicit drugs  . Current medications and supplements . Functional ability and status . Nutritional status . Physical activity . Advanced directives . List of other physicians . Hospitalizations, surgeries, and ER visits in previous 12 months . Vitals . Screenings to include cognitive, depression, and falls . Referrals and appointments  In addition, I have reviewed and discussed with patient certain preventive protocols, quality metrics, and best practice recommendations. A written personalized care plan for preventive services as well as general preventive health recommendations were provided to patient.    This visit was conducted virtually in the setting of the COVID19 pandemic.    Lennox Solders, MD  04/17/2019

## 2019-05-03 ENCOUNTER — Other Ambulatory Visit: Payer: Self-pay | Admitting: Neurology

## 2019-05-03 ENCOUNTER — Telehealth: Payer: Self-pay | Admitting: Neurology

## 2019-05-03 MED ORDER — PREGABALIN 75 MG PO CAPS: 75.0000 mg | ORAL_CAPSULE | Freq: Two times a day (BID) | ORAL | 0 refills | Status: DC

## 2019-05-03 NOTE — Telephone Encounter (Signed)
Due for refills.

## 2019-05-14 DIAGNOSIS — L600 Ingrowing nail: Secondary | ICD-10-CM | POA: Diagnosis not present

## 2019-06-03 ENCOUNTER — Other Ambulatory Visit: Payer: Self-pay | Admitting: Neurology

## 2019-06-19 DIAGNOSIS — G4733 Obstructive sleep apnea (adult) (pediatric): Secondary | ICD-10-CM | POA: Diagnosis not present

## 2019-07-13 ENCOUNTER — Encounter: Payer: Self-pay | Admitting: Family Medicine

## 2019-07-13 ENCOUNTER — Other Ambulatory Visit: Payer: Self-pay

## 2019-07-13 ENCOUNTER — Ambulatory Visit (INDEPENDENT_AMBULATORY_CARE_PROVIDER_SITE_OTHER): Payer: Medicare Other | Admitting: Family Medicine

## 2019-07-13 VITALS — BP 108/62 | HR 95 | Wt 181.8 lb

## 2019-07-13 DIAGNOSIS — Z23 Encounter for immunization: Secondary | ICD-10-CM | POA: Diagnosis not present

## 2019-07-13 DIAGNOSIS — N179 Acute kidney failure, unspecified: Secondary | ICD-10-CM

## 2019-07-13 DIAGNOSIS — N183 Chronic kidney disease, stage 3 unspecified: Secondary | ICD-10-CM | POA: Insufficient documentation

## 2019-07-13 DIAGNOSIS — Z0000 Encounter for general adult medical examination without abnormal findings: Secondary | ICD-10-CM

## 2019-07-13 DIAGNOSIS — I10 Essential (primary) hypertension: Secondary | ICD-10-CM | POA: Diagnosis not present

## 2019-07-13 NOTE — Progress Notes (Signed)
   Subjective:    Patient ID: Jose Li, male    DOB: 1956/02/25, 63 y.o.   MRN: [id number]   CC: Follow up on kidney function  HPI: Mr. Santana presents today to follow-up on blood work for his kidney function.  3 years ago a CT abdomen pelvis showed a 1.6 cm left renal cyst without hydronephrosis or stones identified.  He also stated that he recently stopped taking Advil as he realized this may affect his kidneys.  Has been a gradual decline in kidney function for over 1 year now.  He is originally supposed to come in every 3 months to check his kidney function.  After today's visit we will move to every 6 months since his kidney function appears to be stabilizing.  He does not endorse any other issues that need to be addressed today, he says he is doing well.  Today he states he would like the flu shot.    Smoking status reviewed  Review of Systems Per HPI, also denies recent illness, fever, headache, changes in vision, chest pain, shortness of breath, abdominal pain, N/V/D, weakness   Patient Active Problem List   Diagnosis Date Noted  . Acute kidney injury ([place]) 05/26/2018  . Varicose veins of right lower extremity with complications 39/01/91  . TBI (traumatic brain injury) ([place])   . 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 Bipolar affective disorder in remission ([place])   . Essential hypertension   . Orthostasis   . Radius distal fracture   . Seizures ([place])   . SDH (subdural hematoma) ([place]) 01/28/2016  . OSA (obstructive sleep apnea) 10/01/2015  . TOBACCO ABUSE 12/14/2007  . Hyperlipidemia 10/09/2007  . Epilepsy ([place]) 05/10/2007  . BIPOLAR DISORDER 01/12/2007     Objective:  BP 108/62   Pulse 95   Wt 181 lb 12.8 oz (82.5 kg)   SpO2 98%   BMI 24.66 kg/m  Vitals and nursing note reviewed  General: NAD, pleasant Cardiac: RRR, normal heart sounds, no murmurs Respiratory: CTAB, normal effort Abdomen: soft, nontender, nondistended Extremities: no edema or  cyanosis. WWP. Skin: warm and dry, no rashes noted Neuro: alert and oriented, no focal deficits Psych: normal affect  Assessment & Plan:    Essential hypertension -Continue Midodrine 5 mg, 2 tablets daily at 6 AM, 1 tablet daily at 10 AM and 2 PM. -Blood pressure is 108/62  Healthcare maintenance -Influenza injection given  Chronic kidney disease (CKD), stage III (moderate) (HCC) -Repeat BMP today -Kidney function appears to stabilize last creatinine is 1.4 with GFR 50. -Continue to avoid nephrotoxic drugs -Follow-up in 6 months for repeat BMP    Gerlene Fee, [place] PGY-1

## 2019-07-13 NOTE — Assessment & Plan Note (Signed)
-  Repeat BMP today -Kidney function appears to stabilize last creatinine is 1.4 with GFR 50. -Continue to avoid nephrotoxic drugs -Follow-up in 6 months for repeat BMP

## 2019-07-13 NOTE — Patient Instructions (Addendum)
It was very nice to meet you Mr. Jose Li.  Today we discussed your kidney function and that it is starting to stabilize.  Please follow-up with me in 6 months for more blood work for your kidney function or sooner if needed with any other concerns.  Please have a great weekend.  Dr. Janus Molder, [place] [facility] PGY-1

## 2019-07-13 NOTE — Assessment & Plan Note (Deleted)
-  Repeat BMP today -Kidney function appears to stabilize last creatinine is 1.4 with GFR 50. -Continue to avoid nephrotoxic drugs -Follow-up in 6 months for repeat BMP 

## 2019-07-13 NOTE — Assessment & Plan Note (Signed)
Influenza injection given. 

## 2019-07-13 NOTE — Assessment & Plan Note (Signed)
-  Continue Midodrine 5 mg, 2 tablets daily at 6 AM, 1 tablet daily at 10 AM and 2 PM. -Blood pressure is 108/62

## 2019-07-14 LAB — BASIC METABOLIC PANEL
BUN/Creatinine Ratio: 5 — ABNORMAL LOW (ref 10–24)
BUN: 9 mg/dL (ref 8–27)
CO2: 19 mmol/L — ABNORMAL LOW (ref 20–29)
Calcium: 9.2 mg/dL (ref 8.6–10.2)
Chloride: 108 mmol/L — ABNORMAL HIGH (ref 96–106)
Creatinine, Ser: 1.7 mg/dL — ABNORMAL HIGH (ref 0.76–1.27)
GFR calc Af Amer: 49 mL/min/{1.73_m2} — ABNORMAL LOW (ref >59)
GFR calc non Af Amer: 43 mL/min/{1.73_m2} — ABNORMAL LOW (ref >59)
Glucose: 112 mg/dL — ABNORMAL HIGH (ref 65–99)
Potassium: 4.2 mmol/L (ref 3.5–5.2)
Sodium: 142 mmol/L (ref 134–144)

## 2019-07-14 LAB — HEPATITIS C ANTIBODY (REFLEX): HCV Ab: <0.1 s/co ratio (ref 0.0–0.9)

## 2019-07-14 LAB — HCV COMMENT:

## 2019-07-16 NOTE — Progress Notes (Signed)
Jose Li kidney function appears to continue to decline.  Creatinine is 1.7 increased from 1.49 of  last visit 7 months ago. GFR  is decreased to 43 from 50.  We spoke previously of extending the repeat kidney function tests in 6 months, with these results I would like for him to return in 3 months for a repeat basic metabolic panel.  In preparation for this please remind him to continue to drink adequate amounts of fluids.  I will potentially refer him to nephrology pending next results.  Please schedule 3 month follow up visit for Jose Li repeat BMP.

## 2019-07-17 ENCOUNTER — Telehealth: Payer: Self-pay | Admitting: *Deleted

## 2019-07-17 NOTE — Telephone Encounter (Signed)
Pt informed. Deseree Blount, CMA  

## 2019-07-17 NOTE — Telephone Encounter (Signed)
-----   Message from [name], DO sent at 07/16/2019 10:27 PM EDT ----- Jose Li kidney function appears to continue to decline.  Creatinine is 1.7 increased from 1.49 of  last visit 7 months ago. GFR  is decreased to 43 from 50.  We spoke previously of extending the repeat kidney function tests in 6 months, with these results I would like for him to return in 3 months for a repeat basic metabolic panel.  In preparation for this please remind him to continue to drink adequate amounts of fluids.  I will potentially refer him to nephrology pending next results.  Please schedule 3 month follow up visit for [name] repeat BMP.

## 2019-09-24 ENCOUNTER — Ambulatory Visit (INDEPENDENT_AMBULATORY_CARE_PROVIDER_SITE_OTHER): Payer: Medicare Other | Admitting: Family Medicine

## 2019-09-24 ENCOUNTER — Other Ambulatory Visit: Payer: Self-pay

## 2019-09-24 ENCOUNTER — Encounter: Payer: Self-pay | Admitting: Family Medicine

## 2019-09-24 VITALS — BP 115/80 | HR 77 | Wt 183.6 lb

## 2019-09-24 DIAGNOSIS — N183 Chronic kidney disease, stage 3 unspecified: Secondary | ICD-10-CM

## 2019-09-24 DIAGNOSIS — Z87438 Personal history of other diseases of male genital organs: Secondary | ICD-10-CM | POA: Diagnosis not present

## 2019-09-24 LAB — POCT UA - GLUCOSE/PROTEIN
Glucose, UA: NEGATIVE
Protein, UA: NEGATIVE

## 2019-09-24 NOTE — Patient Instructions (Signed)
It was very nice to meet you today. Please enjoy the rest of your week. Today you were seen for a follow up visit for your kidney function labs. Follow up in 6 months for labs recheck if kidney function stabilized. If not 3 months or sooner if needed. We will call you with results.   Please call the clinic at [phone] if your symptoms worsen or you have any concerns. It was our pleasure to serve you.

## 2019-09-24 NOTE — Progress Notes (Signed)
   Subjective:    Patient ID: Jose Li, male    DOB: 1956-04-24, 63 y.o.   MRN: [id number]   CC: Follow up for kidney function testing   HPI: Mr. Breden presents today for repeat lab work for his kidney function.  He states he is doing well.  He continues to avoid nephrotoxic drugs.  In diet does involve some salt due to cardiology recommendations.  He has a chronic issue with starting his stream when he is using the restroom.  It is intermittent.  He has a history of BPH he states back in his 16s early 55s and was on medication but does not remember the medication.  Last PSA was in 2010 and normal at 0.53.  Today he refuses rectal exam.  He has associated back pain that is intermittent as well relieved by a heating pad that he uses sparingly and Tylenol.  He states he can go up to 6 hours without urinating.  Smoking status reviewed  Review of Systems Per HPI, also denies recent illness, fever, chills, night sweat, dysuria.   Patient Active Problem List   Diagnosis Date Noted  . Chronic kidney disease (CKD), stage III (moderate) 07/13/2019  . Varicose veins of right lower extremity with complications 40/08/2724  . TBI (traumatic brain injury) ([place])   . 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 Bipolar affective disorder in remission ([place])   . Essential hypertension   . Orthostasis   . Radius distal fracture   . Seizures ([place])   . SDH (subdural hematoma) ([place]) 01/28/2016  . OSA (obstructive sleep apnea) 10/01/2015  . TOBACCO ABUSE 12/14/2007  . Hyperlipidemia 10/09/2007  . Epilepsy ([place]) 05/10/2007  . BIPOLAR DISORDER 01/12/2007     Objective:  BP 115/80   Pulse 77   Wt 183 lb 9.6 oz (83.3 kg)   SpO2 97%   BMI 24.90 kg/m  Vitals and nursing note reviewed  General: Appears well, no acute distress. Age appropriate. Cardiac: RRR, normal heart sounds, no murmurs Respiratory: CTAB, normal effort Extremities: No edema or cyanosis. Neuro: alert and oriented, no focal  deficits  Assessment & Plan:    Chronic kidney disease (CKD), stage III (moderate) (HCC) GFR of 43 last BMP 3 months ago.  Patient with decreased frequency of urination.  -Obtain BMP, UA, urine microalbuminuria/creatinine ratio -Follow-up in 6 months if GFR other labs stable otherwise follow-up in 3 months for recheck  History of BPH Patient states he has had a history of enlarged prostate and his late 36s early 86s and was on medication that is unknown to him.  Last PSA was 0.53.  Refused rectal exam today.  Will obtain PSA levels today.  Could be a source of urinary outflow tract obstruction leading to further kidney damage.  We will continue to monitor symptoms with follow-up visits.    Gerlene Fee, [place] [facility] PGY-1

## 2019-09-24 NOTE — Assessment & Plan Note (Signed)
GFR of 43 last BMP 3 months ago.  Patient with decreased frequency of urination.  -Obtain BMP, UA, urine microalbuminuria/creatinine ratio -Follow-up in 6 months if GFR other labs stable otherwise follow-up in 3 months for recheck

## 2019-09-24 NOTE — Assessment & Plan Note (Addendum)
Patient states he has had a history of enlarged prostate and his late 75s early 32s and was on medication that is unknown to him.  Last PSA was 0.53.  Refused rectal exam today.  Will obtain PSA levels today.  Could be a source of urinary outflow tract obstruction leading to further kidney damage.  We will continue to monitor symptoms with follow-up visits.

## 2019-09-25 LAB — BASIC METABOLIC PANEL
BUN/Creatinine Ratio: 6 — ABNORMAL LOW (ref 10–24)
BUN: 9 mg/dL (ref 8–27)
CO2: 18 mmol/L — ABNORMAL LOW (ref 20–29)
Calcium: 9.2 mg/dL (ref 8.6–10.2)
Chloride: 110 mmol/L — ABNORMAL HIGH (ref 96–106)
Creatinine, Ser: 1.43 mg/dL — ABNORMAL HIGH (ref 0.76–1.27)
GFR calc Af Amer: 61 mL/min/{1.73_m2} (ref >59)
GFR calc non Af Amer: 52 mL/min/{1.73_m2} — ABNORMAL LOW (ref >59)
Glucose: 91 mg/dL (ref 65–99)
Potassium: 4.6 mmol/L (ref 3.5–5.2)
Sodium: 142 mmol/L (ref 134–144)

## 2019-09-25 LAB — PSA, TOTAL AND FREE
PSA, Free Pct: 20 %
PSA, Free: 0.14 ng/mL
Prostate Specific Ag, Serum: 0.7 ng/mL (ref 0.0–4.0)

## 2019-09-26 ENCOUNTER — Telehealth: Payer: Self-pay | Admitting: Family Medicine

## 2019-09-27 LAB — MICROALBUMIN / CREATININE URINE RATIO
Creatinine, Urine: 73.4 mg/dL
Microalb/Creat Ratio: <4 mg/g creat (ref 0–29)
Microalbumin, Urine: <3 ug/mL

## 2019-09-27 NOTE — Telephone Encounter (Signed)
Called patient with results, no answer. His kidney function is stable. He can follow up with me in 6 months.

## 2019-10-01 NOTE — Telephone Encounter (Signed)
Pt returned call from Dr. Janus Molder. I gave pt the information below. Pt had good understanding with no questions. Pt will call and schedule an appt for 6 months from now.  Jose Li, CMA

## 2019-10-04 NOTE — Progress Notes (Signed)
Cardiology Office Note   Date:  10/05/2019   ID:  HASSEL UPHOFF, DOB 12-24-1955, MRN [id number]  PCP:  Lavonda Jumbo, DO  Cardiologist:   Dietrich Pates, MD   F/U of dizziness, orthostatic intolerance    History of Present Illness: Jose Li is a 63 y.o. male with a history of dizziness, syncope, autonomic dysfuncton   I saw the pt in Feb 2020    Since seen, the patient is felt okay he notes only occasional dizziness.  No episodes are severe.  He denies syncope.  He is remaining fairly active.  Weight is down a little bit  Allergies:   Duloxetine, Erythromycin, Hctz [hydrochlorothiazide], and Propranolol   Past Medical History:  Diagnosis Date  . Acute kidney injury (HCC) 05/26/2018  . Allergy   . Anemia   . Anxiety   . Depression   . Hyperlipidemia   . Hypertension, essential, benign 08/02/2012  . Orthostatic hypotension 10/09/2013  . OSA (obstructive sleep apnea) 10/01/2015   Severe OSA with an AHI of 86/hr now on BiPAP at 23/19cm H2O  . Seizures (HCC)    none for last 6-6months  . Substance abuse [facility])     Past Surgical History:  Procedure Laterality Date  . FRACTURE SURGERY  1990   right hip and femur  . HERNIA REPAIR  07/2009   umbilical hernia/ got infected  . JOINT REPLACEMENT Right 1990   right hip  . TRACHEOSTOMY  08/1999   Had pneumoniaand was in a coma 10 days     Social History:  The patient  reports that he has been smoking cigarettes. He has a 15.00 pack-year smoking history. He has never used smokeless tobacco. He reports that he does not drink alcohol or use drugs.   Family History:  The patient's family history includes Diabetes in his father; Heart disease in his brother and mother.    ROS:  Please see the history of present illness. All other systems are reviewed and  Negative to the above problem except as noted.    PHYSICAL EXAM: VS:  BP 128/60   Pulse 70   Ht 6' (1.829 m)   Wt 180 lb (81.6 kg)   BMI 24.41 kg/m   GEN: NAD    HEENT: normal  Neck:   JVP is d    No  carotid bruits, or masses Cardiac: RRR; no murmurs, rubs, or gallops,no edema    Respiratory:  clear to auscultation bilaterally, normal work of breathing GI: soft, No masses , nondistended, + BS  No hepatomegaly  MS: no deformity Moving all extremities   Skin: warm and dry, no rash    EKG:  EKG is ordered today.    Lipid Panel    Component Value Date/Time   CHOL 137 01/12/2019 0957   TRIG 152 (H) 01/12/2019 0957   HDL 34 (L) 01/12/2019 0957   CHOLHDL 4.0 01/12/2019 0957   CHOLHDL 5.3 (H) 12/29/2015 1146   VLDL 79 (H) 12/29/2015 1146   LDLCALC 73 01/12/2019 0957      Wt Readings from Last 3 Encounters:  10/05/19 180 lb (81.6 kg)  09/24/19 183 lb 9.6 oz (83.3 kg)  07/13/19 181 lb 12.8 oz (82.5 kg)      ASSESSMENT AND PLAN: 1  Autonomic dysfunction he has done well.  Only occasional dizziness.  Mild.  He needs to stay hydrated.  Staying active.  We will check labs today.    2 CAD.  Plaquing noted on  CT scan in the LAD.  Continue statin and low-dose aspirin.  Patient asymptomatic 3 dyslipidemia lipids controlled.  4  Cr will recheck labs today creatinine has been 1 4-1 7  5   Hypokalemia   check potassium.    F/U in August 2021.  Current medicines are reviewed at length with the patient today.  The patient does not have concerns regarding medicines.  Signed, Dorris Carnes, MD  10/05/2019 10:27 AM    [address], [address], [place]  [address] Phone: [phone]; Fax: [phone]

## 2019-10-05 ENCOUNTER — Other Ambulatory Visit: Payer: Self-pay

## 2019-10-05 ENCOUNTER — Ambulatory Visit (INDEPENDENT_AMBULATORY_CARE_PROVIDER_SITE_OTHER): Payer: Medicare Other | Admitting: Internal Medicine

## 2019-10-05 ENCOUNTER — Encounter: Payer: Self-pay | Admitting: Internal Medicine

## 2019-10-05 VITALS — BP 128/60 | HR 70 | Ht 72.0 in | Wt 180.0 lb

## 2019-10-05 DIAGNOSIS — I498 Other specified cardiac arrhythmias: Secondary | ICD-10-CM | POA: Diagnosis not present

## 2019-10-05 DIAGNOSIS — G90A Postural orthostatic tachycardia syndrome (POTS): Secondary | ICD-10-CM

## 2019-10-05 LAB — BASIC METABOLIC PANEL
BUN/Creatinine Ratio: 7 — ABNORMAL LOW (ref 10–24)
BUN: 11 mg/dL (ref 8–27)
CO2: 20 mmol/L (ref 20–29)
Calcium: 9.1 mg/dL (ref 8.6–10.2)
Chloride: 107 mmol/L — ABNORMAL HIGH (ref 96–106)
Creatinine, Ser: 1.62 mg/dL — ABNORMAL HIGH (ref 0.76–1.27)
GFR calc Af Amer: 52 mL/min/{1.73_m2} — ABNORMAL LOW (ref >59)
GFR calc non Af Amer: 45 mL/min/{1.73_m2} — ABNORMAL LOW (ref >59)
Glucose: 88 mg/dL (ref 65–99)
Potassium: 5.2 mmol/L (ref 3.5–5.2)
Sodium: 138 mmol/L (ref 134–144)

## 2019-10-05 LAB — CBC
Hematocrit: 36.2 % — ABNORMAL LOW (ref 37.5–51.0)
Hemoglobin: 12.6 g/dL — ABNORMAL LOW (ref 13.0–17.7)
MCH: 29.4 pg (ref 26.6–33.0)
MCHC: 34.8 g/dL (ref 31.5–35.7)
MCV: 84 fL (ref 79–97)
Platelets: 293 10*3/uL (ref 150–450)
RBC: 4.29 x10E6/uL (ref 4.14–5.80)
RDW: 12.9 % (ref 11.6–15.4)
WBC: 9.2 10*3/uL (ref 3.4–10.8)

## 2019-10-05 NOTE — Patient Instructions (Signed)
Medication Instructions:  No changes *If you need a refill on your cardiac medications before your next appointment, please call your pharmacy*  Lab Work: Today: cbc, bmet If you have labs (blood work) drawn today and your tests are completely normal, you will receive your results only by: Marland Kitchen MyChart Message (if you have MyChart) OR . A paper copy in the mail If you have any lab test that is abnormal or we need to change your treatment, we will call you to review the results.  Testing/Procedures: none  Follow-Up: At [facility], you and your health needs are our priority.  As part of our continuing mission to provide you with exceptional heart care, we have created designated Provider Care Teams.  These Care Teams include your primary Cardiologist (physician) and Advanced Practice Providers (APPs -  Physician Assistants and Nurse Practitioners) who all work together to provide you with the care you need, when you need it.  Your next appointment:   9 month(s)  The format for your next appointment:   In Person  Provider:   Dorris Carnes, MD  Other Instructions

## 2019-11-05 ENCOUNTER — Ambulatory Visit: Payer: Medicare Other | Admitting: Neurology

## 2019-11-07 ENCOUNTER — Other Ambulatory Visit: Payer: Self-pay | Admitting: Internal Medicine

## 2019-11-07 DIAGNOSIS — I10 Essential (primary) hypertension: Secondary | ICD-10-CM

## 2019-11-12 NOTE — Progress Notes (Signed)
PATIENT: Jose Li DOB: May 02, 1956  REASON FOR VISIT: follow up HISTORY FROM: patient  HISTORY OF PRESENT ILLNESS: Today 11/13/19  Jose Li is a 63 year old male with history of alcohol abuse and history of seizures.  He also has history of orthostatic hypotension and OSA treated with BiPAP.  He remains on Keppra 750 mg tablets, 2 tablets twice a day.  He is also taking Lyrica 75 mg twice daily.  He reports he has not had recurrent seizure.  He says he has had 2 episodes of dizziness, likely related to his autonomic dysfunction, but no blackouts.  He says he is no longer drinking alcohol.  He does not drive a car.  He lives with his father who is 80 years old.  He says he is able to manage his medications.  He indicates his overall health has been well.  He is on disability, he does not do much during the day.  He presents today for evaluation unaccompanied.  HISTORY 10/24/2018 CM: HISTORY OF PRESENT ILLNESS:Jose Li is a 63 year old right-handed white male with a history of alcohol abuse and a history of seizures. The patient also has orthostatic hypotension, treated with Midodrine by Dr. Radford Pax. Marland Kitchen He also has obstructive sleep apnea treated with BiPAP by Dr. Radford Pax. The patient is on Keppra taking the 750 mg tablets, 2 tablets twice daily. The patient has not had any seizures since last seen. The patient is tolerating the [place] quite well.  Patient is also on Lyrica 75 mg twice daily.  He has not had any blackouts associated with orthostatic hypotension.  He was seen in the emergency room 08/10/2017 for accidental overdose.  Father is now administering his medications.  He is not operating a motor vehicle.He returns for reevaluation  REVIEW OF SYSTEMS: Out of a complete 14 system review of symptoms, the patient complains only of the following symptoms, and all other reviewed systems are negative.  Seizures  ALLERGIES: Allergies  Allergen Reactions  . Duloxetine Other (See Comments)    Caused hyponatremia  . Erythromycin Other (See Comments)    Unknown reaction. The patient can not remember  . Hctz [Hydrochlorothiazide]     "Makes him faint" per patient Dr. Harrington Challenger ([place]) took him off  . Propranolol     "Makes him faint" per patient Dr. Harrington Challenger ([place]) took him off      HOME MEDICATIONS: Outpatient Medications Prior to Visit  Medication Sig Dispense Refill  . acetaminophen (TYLENOL 8 HOUR) 650 MG CR tablet Take 1 tablet (650 mg total) by mouth every 8 (eight) hours as needed for pain. 90 tablet 11  . aspirin 81 MG EC tablet Take 81 mg by mouth daily.      Marland Kitchen atorvastatin (LIPITOR) 20 MG tablet Take 1 tablet by mouth once daily 90 tablet 3  . benztropine (COGENTIN) 1 MG tablet Take 1 mg by mouth daily.     . busPIRone (BUSPAR) 5 MG tablet Take 5 mg by mouth 2 (two) times daily.     . cetirizine (ZYRTEC) 10 MG tablet Take 1 tablet (10 mg total) by mouth daily. 90 tablet 3  . citalopram (CELEXA) 20 MG tablet Take 20 mg by mouth daily.    . midodrine (PROAMATINE) 5 MG tablet Take 2 tablets daily at 6 am, take 1 tablet daily at 10 am and 2pm 120 tablet 11  . Multiple Vitamin (MULTIVITAMIN) tablet Take 1 tablet by mouth daily.    . pindolol (VISKEN) 5  MG tablet Take 1/2 (one-half) tablet by mouth twice daily 90 tablet 3  . potassium chloride SA (K-DUR) 20 MEQ tablet TAKE 2 TABLETS BY MOUTH ONCE DAILY IN THE MORNING AND 1 TABLET IN THE EVENING 270 tablet 2  . risperiDONE (RISPERDAL) 2 MG tablet Take 2 mg by mouth at bedtime.     . thiamine 100 MG tablet Take 100 mg by mouth daily.      . traZODone (DESYREL) 150 MG tablet Take 150 mg by mouth at bedtime.     . levETIRAcetam (KEPPRA) 750 MG tablet Take 2 tablets (1,500 mg total) by mouth 2 (two) times daily. TAKE TWO TABLETS BY MOUTH TWICE DAILY. 360 tablet 3  . pregabalin (LYRICA) 75 MG capsule Take 1 capsule by mouth twice daily 60 capsule 5   No facility-administered medications prior to visit.    PAST MEDICAL HISTORY:  Past Medical History:  Diagnosis Date  . Acute kidney injury (HCC) 05/26/2018  . Allergy   . Anemia   . Anxiety   . Depression   . Hyperlipidemia   . Hypertension, essential, benign 08/02/2012  . Orthostatic hypotension 10/09/2013  . OSA (obstructive sleep apnea) 10/01/2015   Severe OSA with an AHI of 86/hr now on BiPAP at 23/19cm H2O  . Seizures (HCC)    none for last 6-28months  . Substance abuse (HCC)     PAST SURGICAL HISTORY: Past Surgical History:  Procedure Laterality Date  . FRACTURE SURGERY  1990   right hip and femur  . HERNIA REPAIR  07/2009   umbilical hernia/ got infected  . JOINT REPLACEMENT Right 1990   right hip  . TRACHEOSTOMY  08/1999   Had pneumoniaand was in a coma 10 days    FAMILY HISTORY: Family History  Problem Relation Age of Onset  . Heart disease Mother   . Diabetes Father   . Heart disease Brother   . Hypertension Neg Hx   . Stroke Neg Hx     SOCIAL HISTORY: Social History   Socioeconomic History  . Marital status: Single    Spouse name: Not on file  . Number of children: Not on file  . Years of education: Not on file  . Highest education level: Not on file  Occupational History  . Occupation: diability  Tobacco Use  . Smoking status: Current Every Day Smoker    Packs/day: 0.50    Years: 30.00    Pack years: 15.00    Types: Cigarettes  . Smokeless tobacco: Never Used  . Tobacco comment: Had cut back, but stress has prompted him to smoke again  Substance and Sexual Activity  . Alcohol use: No    Comment: history of alcohol abuse.  . Drug use: No  . Sexual activity: Not Currently  Other Topics Concern  . Not on file  Social History Narrative   Lives with 4 year old Father, [employer]. Watches TV for fun. Likes Social worker and order.      His only son lives with his mother in [place], and attends [employer].    Social Determinants of Health   Financial Resource Strain:   . Difficulty of Paying Living Expenses: Not on file   Food Insecurity:   . Worried About Programme researcher, broadcasting/film/video in the Last Year: Not on file  . Ran Out of Food in the Last Year: Not on file  Transportation Needs:   . Lack of Transportation (Medical): Not on file  . Lack of Transportation (Non-Medical): Not  on file  Physical Activity:   . Days of Exercise per Week: Not on file  . Minutes of Exercise per Session: Not on file  Stress:   . Feeling of Stress : Not on file  Social Connections:   . Frequency of Communication with Friends and Family: Not on file  . Frequency of Social Gatherings with Friends and Family: Not on file  . Attends Religious Services: Not on file  . Active Member of Clubs or Organizations: Not on file  . Attends BankerClub or Organization Meetings: Not on file  . Marital Status: Not on file  Intimate Partner Violence:   . Fear of Current or Ex-Partner: Not on file  . Emotionally Abused: Not on file  . Physically Abused: Not on file  . Sexually Abused: Not on file    PHYSICAL EXAM  Vitals:   11/13/19 0930  BP: 122/74  Pulse: 70  Temp: (!) 97.3 F (36.3 C)  TempSrc: Oral  Weight: 177 lb 9.6 oz (80.6 kg)  Height: 6' (1.829 m)   Body mass index is 24.09 kg/m.  Generalized: Well developed, in no acute distress   Neurological examination  Mentation: Alert oriented to time, place, history taking. Follows all commands speech and language fluent Cranial nerve II-XII: Pupils were equal round reactive to light. Extraocular movements were full, visual field were full on confrontational test. Facial sensation and strength were normal.  Head turning and shoulder shrug  were normal and symmetric. Motor: The motor testing reveals 5 over 5 strength of all 4 extremities. Good symmetric motor tone is noted throughout.  Sensory: Sensory testing is intact to soft touch on all 4 extremities. No evidence of extinction is noted.  Coordination: Cerebellar testing reveals good finger-nose-finger and heel-to-shin bilaterally.  Gait and  station: Gait is normal. Tandem gait is normal. Reflexes: Deep tendon reflexes are symmetric and normal bilaterally.   DIAGNOSTIC DATA (LABS, IMAGING, TESTING) - I reviewed patient records, labs, notes, testing and imaging myself where available.  Lab Results  Component Value Date   WBC 9.2 10/05/2019   HGB 12.6 (L) 10/05/2019   HCT 36.2 (L) 10/05/2019   MCV 84 10/05/2019   PLT 293 10/05/2019      Component Value Date/Time   NA 138 10/05/2019 1046   K 5.2 10/05/2019 1046   CL 107 (H) 10/05/2019 1046   CO2 20 10/05/2019 1046   GLUCOSE 88 10/05/2019 1046   GLUCOSE 89 08/10/2017 1533   BUN 11 10/05/2019 1046   CREATININE 1.62 (H) 10/05/2019 1046   CREATININE 0.94 06/10/2016 1613   CALCIUM 9.1 10/05/2019 1046   PROT 6.6 01/23/2018 1022   ALBUMIN 4.3 01/23/2018 1022   AST 21 01/23/2018 1022   ALT 31 01/23/2018 1022   ALKPHOS 55 01/23/2018 1022   BILITOT 0.3 01/23/2018 1022   GFRNONAA 45 (L) 10/05/2019 1046   GFRNONAA 84 02/17/2016 1011   GFRAA 52 (L) 10/05/2019 1046   GFRAA >89 02/17/2016 1011   Lab Results  Component Value Date   CHOL 137 01/12/2019   HDL 34 (L) 01/12/2019   LDLCALC 73 01/12/2019   TRIG 152 (H) 01/12/2019   CHOLHDL 4.0 01/12/2019   Lab Results  Component Value Date   HGBA1C 5.6 12/29/2015   Lab Results  Component Value Date   VITAMINB12 473 02/06/2016   Lab Results  Component Value Date   TSH 1.518 02/06/2016   ASSESSMENT AND PLAN 63 y.o. year old male  has a past medical history of  Acute kidney injury (HCC) (05/26/2018), Allergy, Anemia, Anxiety, Depression, Hyperlipidemia, Hypertension, essential, benign (08/02/2012), Orthostatic hypotension (10/09/2013), OSA (obstructive sleep apnea) (10/01/2015), Seizures (HCC), and Substance abuse (HCC). here with:  1.  Seizures 2.  Orthostatic hypotension  He has not had recurrent seizure since last seen.  She says he has had 2 episodes of dizziness, likely related to his autonomic dysfunction, but did  not result in blackout.  He will remain on Keppra 750 mg tablet, 2 tablets twice a day, Lyrica 75 mg twice a day.  I have sent refills of the medications.  He will call for recurrent seizure.  He will follow-up in 1 year or sooner if needed.  I spent 15 minutes with the patient. 50% of this time was spent discussing his plan of care.   Margie Ege, AGNP-C, DNP 11/13/2019, 10:11 AM [facility] [address], [address] [place], [place] [address] [phone]

## 2019-11-13 ENCOUNTER — Encounter: Payer: Self-pay | Admitting: Neurology

## 2019-11-13 ENCOUNTER — Ambulatory Visit: Payer: Medicare Other | Admitting: Neurology

## 2019-11-13 ENCOUNTER — Other Ambulatory Visit: Payer: Self-pay

## 2019-11-13 VITALS — BP 122/74 | HR 70 | Temp 97.3°F | Ht 72.0 in | Wt 177.6 lb

## 2019-11-13 DIAGNOSIS — G40909 Epilepsy, unspecified, not intractable, without status epilepticus: Secondary | ICD-10-CM

## 2019-11-13 MED ORDER — PREGABALIN 75 MG PO CAPS: 75.0000 mg | ORAL_CAPSULE | Freq: Two times a day (BID) | ORAL | 5 refills | Status: DC

## 2019-11-13 MED ORDER — LEVETIRACETAM 750 MG PO TABS: 1500.0000 mg | ORAL_TABLET | Freq: Two times a day (BID) | ORAL | 3 refills | Status: DC

## 2019-11-13 NOTE — Patient Instructions (Signed)
Continue Keppra and Lyrica at current dose  Call for recurrent seizure   See you in 1 year or sooner if needed

## 2019-11-13 NOTE — Progress Notes (Signed)
I have read the note, and I agree with the clinical assessment and plan.  Lenda Baratta K Baylen Buckner   

## 2019-12-17 ENCOUNTER — Other Ambulatory Visit: Payer: Self-pay | Admitting: Internal Medicine

## 2019-12-24 ENCOUNTER — Other Ambulatory Visit: Payer: Self-pay | Admitting: Student in an Organized Health Care Education/Training Program

## 2020-01-14 ENCOUNTER — Other Ambulatory Visit: Payer: Self-pay

## 2020-01-14 ENCOUNTER — Ambulatory Visit (INDEPENDENT_AMBULATORY_CARE_PROVIDER_SITE_OTHER): Payer: Medicare Other | Admitting: Family Medicine

## 2020-01-14 ENCOUNTER — Encounter: Payer: Self-pay | Admitting: Family Medicine

## 2020-01-14 VITALS — BP 100/65 | HR 78 | Wt 182.2 lb

## 2020-01-14 DIAGNOSIS — M7918 Myalgia, other site: Secondary | ICD-10-CM | POA: Diagnosis not present

## 2020-01-14 DIAGNOSIS — N183 Chronic kidney disease, stage 3 unspecified: Secondary | ICD-10-CM

## 2020-01-14 DIAGNOSIS — Z87438 Personal history of other diseases of male genital organs: Secondary | ICD-10-CM | POA: Diagnosis not present

## 2020-01-14 NOTE — Patient Instructions (Signed)
It was great meeting you today!  We will get a kidney function test to see how your kidneys are doing.  I think that your back pain is due to a paraspinal muscle strain.  I gave you some rehab stretches to do.  You can take Tylenol as needed for the pain.  Also you can pick up some Voltaren gel, this is a topical solution you can put on your areas that hurt.

## 2020-01-14 NOTE — Progress Notes (Signed)
   CHIEF COMPLAINT / HPI: 64 year old male who presents for kidney check.  Last BMP on 10/05/2019 with creatinine of 1.62, GFR of 45.  Presented for recheck.  Per notes from PCP looks like the plan is to refer him to nephrology if worsens. He has been having good urine output, but he states that he has a hard time getting his stream going.  Once he starts urinating comes out just fine.  Is also complaining about some lower back pain.  States that it started suddenly when he woke up 1 day.  Mainly located in his lumbar spine six centimeters or so from midline.  He has not tried anything for the pain.  PERTINENT  PMH / PSH: ckd stage IIIa   OBJECTIVE: BP 100/65   Pulse 78   Wt 182 lb 3.2 oz (82.6 kg)   SpO2 99%   BMI 24.71 kg/m   Gen: Very pleasant 64 year old Caucasian male, no acute distress, resting comfortably CV: Regular rate rhythm, no M/R/G Resp: Lungs clear to auscultation bilaterally, no accessory muscle use Back: Mild tenderness to palpation paraspinal muscle distribution proximately 6 cm from midline lumbar spine.  Mild tenderness to flexion, extension.  No tenderness to rotation.  No neurologic deficits appreciated, 5 of 5 strength flexion, extension legs.  ASSESSMENT / PLAN:  Chronic kidney disease (CKD), stage III (moderate) (HCC) Will get BMP to assess.  Avoid nephrotoxic medications.  If GFR less than 45, and creatinine increase will consider referral to nephrology.  History of BPH Patient with some history of difficulty starting stream.  History of BPH.  Consider Flomax but will hold off at this point due to patient's known postural hypotension.  Could consider finasteride in the future, but will defer to PCP.  Pain of paraspinal muscle Patient with likely strain of paraspinal muscle.  Gave rehab stretches.  Can take Tylenol as needed for pain.  Recommended Voltaren gel, should get some benefit from anti-inflammatory, but not systemically absorbed so unlikely to affect  his kidneys.   Myrene Buddy MD PGY-3 Family Medicine Resident [facility]

## 2020-01-14 NOTE — Assessment & Plan Note (Signed)
Will get BMP to assess.  Avoid nephrotoxic medications.  If GFR less than 45, and creatinine increase will consider referral to nephrology.

## 2020-01-14 NOTE — Assessment & Plan Note (Signed)
Patient with some history of difficulty starting stream.  History of BPH.  Consider Flomax but will hold off at this point due to patient's known postural hypotension.  Could consider finasteride in the future, but will defer to PCP.

## 2020-01-14 NOTE — Assessment & Plan Note (Signed)
Patient with likely strain of paraspinal muscle.  Gave rehab stretches.  Can take Tylenol as needed for pain.  Recommended Voltaren gel, should get some benefit from anti-inflammatory, but not systemically absorbed so unlikely to affect his kidneys.

## 2020-01-15 LAB — BASIC METABOLIC PANEL
BUN/Creatinine Ratio: 5 — ABNORMAL LOW (ref 10–24)
BUN: 8 mg/dL (ref 8–27)
CO2: 17 mmol/L — ABNORMAL LOW (ref 20–29)
Calcium: 9.4 mg/dL (ref 8.6–10.2)
Chloride: 106 mmol/L (ref 96–106)
Creatinine, Ser: 1.54 mg/dL — ABNORMAL HIGH (ref 0.76–1.27)
GFR calc Af Amer: 55 mL/min/{1.73_m2} — ABNORMAL LOW (ref >59)
GFR calc non Af Amer: 48 mL/min/{1.73_m2} — ABNORMAL LOW (ref >59)
Glucose: 80 mg/dL (ref 65–99)
Potassium: 4.5 mmol/L (ref 3.5–5.2)
Sodium: 141 mmol/L (ref 134–144)

## 2020-01-16 ENCOUNTER — Telehealth: Payer: Self-pay

## 2020-01-16 NOTE — Telephone Encounter (Signed)
Patient calls nurse line requesting lab results from 3/1 visit. Please advise.

## 2020-01-17 ENCOUNTER — Telehealth: Payer: Self-pay

## 2020-01-17 NOTE — Telephone Encounter (Signed)
Attempted to call patient and there was no answer. LVM for patient to call back.  Will call later.  Glennie Hawk, CMA

## 2020-01-17 NOTE — Telephone Encounter (Signed)
Informed patient of results and relayed advise given by Dr. Primitivo Gauze.  Patient verbalized understanding.  Glennie Hawk, CMA'

## 2020-01-17 NOTE — Telephone Encounter (Signed)
Please let Mr. Tapp know that his kidney function is essentially the same as it was three months ago with very minor improvement. Recommendation is to keep avoiding kidney damaging medications and follow up in 3 months for another kidney function check with his pcp.  Myrene Buddy MD PGY-3 Family Medicine Resident

## 2020-03-11 ENCOUNTER — Other Ambulatory Visit: Payer: Self-pay | Admitting: Internal Medicine

## 2020-04-17 ENCOUNTER — Other Ambulatory Visit: Payer: Self-pay

## 2020-04-17 ENCOUNTER — Ambulatory Visit (INDEPENDENT_AMBULATORY_CARE_PROVIDER_SITE_OTHER): Payer: Medicare Other | Admitting: Family Medicine

## 2020-04-17 DIAGNOSIS — N183 Chronic kidney disease, stage 3 unspecified: Secondary | ICD-10-CM | POA: Diagnosis not present

## 2020-04-17 NOTE — Progress Notes (Signed)
No charge. Patient coming for labs. Nurse visit.

## 2020-04-18 LAB — BASIC METABOLIC PANEL
BUN/Creatinine Ratio: 6 — ABNORMAL LOW (ref 10–24)
BUN: 13 mg/dL (ref 8–27)
CO2: 18 mmol/L — ABNORMAL LOW (ref 20–29)
Calcium: 9.1 mg/dL (ref 8.6–10.2)
Chloride: 109 mmol/L — ABNORMAL HIGH (ref 96–106)
Creatinine, Ser: 2.03 mg/dL — ABNORMAL HIGH (ref 0.76–1.27)
GFR calc Af Amer: 39 mL/min/{1.73_m2} — ABNORMAL LOW (ref >59)
GFR calc non Af Amer: 34 mL/min/{1.73_m2} — ABNORMAL LOW (ref >59)
Glucose: 99 mg/dL (ref 65–99)
Potassium: 4.5 mmol/L (ref 3.5–5.2)
Sodium: 140 mmol/L (ref 134–144)

## 2020-04-21 ENCOUNTER — Telehealth: Payer: Self-pay

## 2020-04-21 NOTE — Telephone Encounter (Signed)
Patient calls nurse line requesting BMP results. Happy to relay any result message. Please advise.

## 2020-04-21 NOTE — Telephone Encounter (Signed)
Patient is aware of results. Will refer to nephrology. Patient voiced understanding.

## 2020-04-22 ENCOUNTER — Other Ambulatory Visit: Payer: Self-pay | Admitting: Family Medicine

## 2020-04-22 DIAGNOSIS — N1832 Chronic kidney disease, stage 3b: Secondary | ICD-10-CM

## 2020-06-03 ENCOUNTER — Telehealth (INDEPENDENT_AMBULATORY_CARE_PROVIDER_SITE_OTHER): Payer: Medicare Other | Admitting: Cardiology

## 2020-06-03 ENCOUNTER — Other Ambulatory Visit: Payer: Self-pay

## 2020-06-03 ENCOUNTER — Other Ambulatory Visit: Payer: Self-pay | Admitting: Neurology

## 2020-06-03 ENCOUNTER — Encounter: Payer: Self-pay | Admitting: Cardiology

## 2020-06-03 ENCOUNTER — Telehealth: Payer: Self-pay | Admitting: *Deleted

## 2020-06-03 VITALS — Ht 72.0 in | Wt 180.0 lb

## 2020-06-03 DIAGNOSIS — I10 Essential (primary) hypertension: Secondary | ICD-10-CM

## 2020-06-03 DIAGNOSIS — G4733 Obstructive sleep apnea (adult) (pediatric): Secondary | ICD-10-CM | POA: Diagnosis not present

## 2020-06-03 NOTE — Progress Notes (Signed)
Virtual Visit via Telephone Note   This visit type was conducted due to national recommendations for restrictions regarding the COVID-19 Pandemic (e.g. social distancing) in an effort to limit this patient's exposure and mitigate transmission in our community.  Due to his co-morbid illnesses, this patient is at least at moderate risk for complications without adequate follow up.  This format is felt to be most appropriate for this patient at this time.  The patient did not have access to video technology/had technical difficulties with video requiring transitioning to audio format only (telephone).  All issues noted in this document were discussed and addressed.  No physical exam could be performed with this format.  Please refer to the patient's chart for his  consent to telehealth for [facility].   Evaluation Performed:  Follow-up visit  This visit type was conducted due to national recommendations for restrictions regarding the COVID-19 Pandemic (e.g. social distancing).  This format is felt to be most appropriate for this patient at this time.  All issues noted in this document were discussed and addressed.  No physical exam was performed (except for noted visual exam findings with Video Visits).  Please refer to the patient's chart (MyChart message for video visits and phone note for telephone visits) for the patient's consent to telehealth for [facility].  Date:  06/03/2020   ID:  Jose Li, DOB 1956-06-14, MRN [id number]  Patient Location:  Home  Provider location:   [place]  PCP:  Lavonda Jumbo, DO  Cardiologist:  Dietrich Pates, MD  Sleep Medicine:  Armanda Magic, MD Electrophysiologist:  None   Chief Complaint:  OSA  History of Present Illness:    Jose Li is a 64 y.o. male who presents via audio/video conferencing for a telehealth visit today.    Jose Li is a 64 y.o. male with a hx of severe OSA with an AHI of 86/hr. He is on BiPAP at 23/19cm H2O. He  was doing well with his PAP device but there is a problem with heating the device so he had not been using it much.  He tolerates the mask and feels the pressure is adequate.  Since going on PAP he feels rested in the am and has no significant daytime sleepiness.  He denies any significant mouth or nasal dryness or nasal congestion.  He does not think that he snores.    The patient does not have symptoms concerning for COVID-19 infection (fever, chills, cough, or new shortness of breath).   Prior CV studies:   The following studies were reviewed today:  none  Past Medical History:  Diagnosis Date   Acute kidney injury (HCC) 05/26/2018   Allergy    Anemia    Anxiety    Depression    Hyperlipidemia    Hypertension, essential, benign 08/02/2012   Orthostatic hypotension 10/09/2013   OSA (obstructive sleep apnea) 10/01/2015   Severe OSA with an AHI of 86/hr now on BiPAP at 23/19cm H2O   Seizures (HCC)    none for last 6-46months   Substance abuse [facility])    Past Surgical History:  Procedure Laterality Date   FRACTURE SURGERY  1990   right hip and femur   HERNIA REPAIR  07/2009   umbilical hernia/ got infected   JOINT REPLACEMENT Right 1990   right hip   TRACHEOSTOMY  08/1999   Had pneumoniaand was in a coma 10 days     Current Meds  Medication Sig   acetaminophen (  TYLENOL 8 HOUR) 650 MG CR tablet Take 1 tablet (650 mg total) by mouth every 8 (eight) hours as needed for pain.   aspirin 81 MG EC tablet Take 81 mg by mouth daily.     atorvastatin (LIPITOR) 20 MG tablet Take 1 tablet by mouth once daily   benztropine (COGENTIN) 1 MG tablet Take 1 mg by mouth daily.    busPIRone (BUSPAR) 5 MG tablet Take 5 mg by mouth 2 (two) times daily.    cetirizine (ZYRTEC) 10 MG tablet Take 1 tablet by mouth once daily   citalopram (CELEXA) 20 MG tablet Take 20 mg by mouth daily.   levETIRAcetam (KEPPRA) 750 MG tablet Take 2 tablets (1,500 mg total) by mouth 2 (two) times  daily. TAKE TWO TABLETS BY MOUTH TWICE DAILY.   midodrine (PROAMATINE) 5 MG tablet TAKE 2 TABLETS BY MOUTH DAILY AT 6AM, THEN TAKE 1 TABLET DAILY AT 10 AM AND 2PM   Multiple Vitamin (MULTIVITAMIN) tablet Take 1 tablet by mouth daily.   pindolol (VISKEN) 5 MG tablet Take 1/2 (one-half) tablet by mouth twice daily   potassium chloride SA (KLOR-CON) 20 MEQ tablet TAKE 2 TABLETS BY MOUTH IN THE MORNING AND 1 TABLET IN THE EVENING   pregabalin (LYRICA) 75 MG capsule Take 1 capsule (75 mg total) by mouth 2 (two) times daily.   risperiDONE (RISPERDAL) 2 MG tablet Take 2 mg by mouth at bedtime.    thiamine 100 MG tablet Take 100 mg by mouth daily.     traZODone (DESYREL) 150 MG tablet Take 150 mg by mouth at bedtime.      Allergies:   Duloxetine, Erythromycin, Hctz [hydrochlorothiazide], and Propranolol   Social History   Tobacco Use   Smoking status: Current Every Day Smoker    Packs/day: 0.50    Years: 30.00    Pack years: 15.00    Types: Cigarettes   Smokeless tobacco: Never Used   Tobacco comment: Had cut back, but stress has prompted him to smoke again  Vaping Use   Vaping Use: Never used  Substance Use Topics   Alcohol use: No    Comment: history of alcohol abuse.   Drug use: No     Family Hx: The patient's family history includes Diabetes in his father; Heart disease in his brother and mother. There is no history of Hypertension or Stroke.  ROS:   Please see the history of present illness.     All other systems reviewed and are negative.   Labs/Other Tests and Data Reviewed:    Recent Labs: 10/05/2019: Hemoglobin 12.6; Platelets 293 04/17/2020: BUN 13; Creatinine, Ser 2.03; Potassium 4.5; Sodium 140   Recent Lipid Panel Lab Results  Component Value Date/Time   CHOL 137 01/12/2019 09:57 AM   TRIG 152 (H) 01/12/2019 09:57 AM   HDL 34 (L) 01/12/2019 09:57 AM   CHOLHDL 4.0 01/12/2019 09:57 AM   CHOLHDL 5.3 (H) 12/29/2015 11:46 AM   LDLCALC 73 01/12/2019  09:57 AM    Wt Readings from Last 3 Encounters:  06/03/20 180 lb (81.6 kg)  01/14/20 182 lb 3.2 oz (82.6 kg)  11/13/19 177 lb 9.6 oz (80.6 kg)     Objective:    Vital Signs:  Ht 6' (1.829 m)    Wt 180 lb (81.6 kg)    BMI 24.41 kg/m     ASSESSMENT & PLAN:    1.  OSA - The patient is tolerating PAP therapy well without any problems.  The patient  has been using and benefiting from PAP use and will continue to benefit from therapy.  -he has not used it in the last 30 days due to it not heating up -I will make an appt for him with the DME to get his device fixed and then get a download in 4-6 weeks  2.  HTN -continue Pindolol 2.5mg  daily  COVID-19 Education: The signs and symptoms of COVID-19 were discussed with the patient and how to seek care for testing (follow up with PCP or arrange E-visit).  The importance of social distancing was discussed today.  Patient Risk:   After full review of this patient's clinical status, I feel that they are at least moderate risk at this time.  Time:   Today, I have spent 20 minutes on telemedicine discussing medical problems including OSA and HTN and reviewing patient's chart including PAP compliance download.  Medication Adjustments/Labs and Tests Ordered: Current medicines are reviewed at length with the patient today.  Concerns regarding medicines are outlined above.  Tests Ordered: No orders of the defined types were placed in this encounter.  Medication Changes: No orders of the defined types were placed in this encounter.   Disposition:  Follow up in 1 year(s)  Signed, Armanda Magic, MD  06/03/2020 9:05 AM    [facility]

## 2020-06-03 NOTE — Telephone Encounter (Signed)
Order placed to [employer] via community message for patient appointment. 1 year f/u appt made

## 2020-06-03 NOTE — Telephone Encounter (Signed)
-----   Message from Quintella Reichert, MD sent at 06/03/2020  9:08 AM EDT ----- Please set up an appt for him with the DME to get his device fixed and then get a download in 4-6 weeks.  The device is not heating the air. Followup with me in 1 yera

## 2020-06-10 DIAGNOSIS — G4733 Obstructive sleep apnea (adult) (pediatric): Secondary | ICD-10-CM | POA: Diagnosis not present

## 2020-06-18 DIAGNOSIS — I129 Hypertensive chronic kidney disease with stage 1 through stage 4 chronic kidney disease, or unspecified chronic kidney disease: Secondary | ICD-10-CM | POA: Diagnosis not present

## 2020-06-18 DIAGNOSIS — N1832 Chronic kidney disease, stage 3b: Secondary | ICD-10-CM | POA: Diagnosis not present

## 2020-06-18 DIAGNOSIS — G909 Disorder of the autonomic nervous system, unspecified: Secondary | ICD-10-CM | POA: Diagnosis not present

## 2020-06-18 DIAGNOSIS — E876 Hypokalemia: Secondary | ICD-10-CM | POA: Diagnosis not present

## 2020-08-08 DIAGNOSIS — N1831 Chronic kidney disease, stage 3a: Secondary | ICD-10-CM | POA: Diagnosis not present

## 2020-08-08 DIAGNOSIS — I129 Hypertensive chronic kidney disease with stage 1 through stage 4 chronic kidney disease, or unspecified chronic kidney disease: Secondary | ICD-10-CM | POA: Diagnosis not present

## 2020-08-08 DIAGNOSIS — N179 Acute kidney failure, unspecified: Secondary | ICD-10-CM | POA: Diagnosis not present

## 2020-08-08 DIAGNOSIS — E876 Hypokalemia: Secondary | ICD-10-CM | POA: Diagnosis not present

## 2020-09-02 ENCOUNTER — Other Ambulatory Visit: Payer: Self-pay | Admitting: Internal Medicine

## 2020-09-09 ENCOUNTER — Other Ambulatory Visit: Payer: Self-pay

## 2020-09-09 ENCOUNTER — Encounter: Payer: Self-pay | Admitting: Family Medicine

## 2020-09-09 ENCOUNTER — Ambulatory Visit (INDEPENDENT_AMBULATORY_CARE_PROVIDER_SITE_OTHER): Payer: Medicare Other | Admitting: Family Medicine

## 2020-09-09 VITALS — BP 106/62 | HR 85 | Wt 181.0 lb

## 2020-09-09 DIAGNOSIS — N183 Chronic kidney disease, stage 3 unspecified: Secondary | ICD-10-CM

## 2020-09-09 DIAGNOSIS — F17200 Nicotine dependence, unspecified, uncomplicated: Secondary | ICD-10-CM

## 2020-09-09 DIAGNOSIS — I10 Essential (primary) hypertension: Secondary | ICD-10-CM | POA: Diagnosis not present

## 2020-09-09 DIAGNOSIS — Z23 Encounter for immunization: Secondary | ICD-10-CM

## 2020-09-09 NOTE — Patient Instructions (Signed)
It was wonderful to see you today.  Please bring ALL of your medications with you to every visit.   Today we talked about:  Your kidney function. You have seen [facility] and I will attempt to access these records. I will call you with today's results and discuss how often we need to check.   Your pressure looks great today. If you have any symptoms of dizziness please let us know.   When you are ready to quit smoking we are here to assist you in your journey as this will be beneficial to your overall health.   Please call the clinic at [phone] if you have any concerns. It was our pleasure to serve you.  Dr. Salvadore Dom

## 2020-09-09 NOTE — Progress Notes (Signed)
    SUBJECTIVE:   CHIEF COMPLAINT / HPI:   Jose Li is a 64 yo M who presents for the reasons below.   Kidney check Recently seen at [facility] by Dr. Malen Gauze. Started on tamsulosin for BPH. Urinating better. No other concerns today.   Flu vaccine Would like vaccine today. Fully vaccinated against COVID. Has not received booster.   Smoking cessation Current smoker. 1/2 pk/day. Not interested in quitting at this time.   PERTINENT  PMH / PSH: Orthostatic hypotension (midodrine), hx of TBI, bipolar disorder  OBJECTIVE:   BP 106/62   Pulse 85   Wt 181 lb (82.1 kg)   SpO2 98%   BMI 24.55 kg/m   General: Appears well, no acute distress. Age appropriate. Cardiac: RRR, normal heart sounds, no murmurs Respiratory: CTAB, normal effort  ASSESSMENT/PLAN:   Chronic kidney disease (CKD), stage III (moderate) (HCC) -Obtain BMP; forward to [facility] per patient's request -Obtain records from [facility]   Need for immunization against influenza - Flu Vaccine QUAD 36+ mos IM   TOBACCO ABUSE Cessation encouraged for the improvement of overall health. -Continue to assess with subsequent visits.    Lavonda Jumbo, DO [facility]

## 2020-09-10 LAB — BASIC METABOLIC PANEL
BUN/Creatinine Ratio: 6 — ABNORMAL LOW (ref 10–24)
BUN: 8 mg/dL (ref 8–27)
CO2: 22 mmol/L (ref 20–29)
Calcium: 9.4 mg/dL (ref 8.6–10.2)
Chloride: 107 mmol/L — ABNORMAL HIGH (ref 96–106)
Creatinine, Ser: 1.38 mg/dL — ABNORMAL HIGH (ref 0.76–1.27)
GFR calc Af Amer: 63 mL/min/{1.73_m2} (ref >59)
GFR calc non Af Amer: 54 mL/min/{1.73_m2} — ABNORMAL LOW (ref >59)
Glucose: 89 mg/dL (ref 65–99)
Potassium: 4.3 mmol/L (ref 3.5–5.2)
Sodium: 141 mmol/L (ref 134–144)

## 2020-09-11 ENCOUNTER — Telehealth: Payer: Self-pay | Admitting: Family Medicine

## 2020-09-11 DIAGNOSIS — Z23 Encounter for immunization: Secondary | ICD-10-CM | POA: Insufficient documentation

## 2020-09-11 NOTE — Assessment & Plan Note (Signed)
Cessation encouraged for the improvement of overall health. -Continue to assess with subsequent visits.

## 2020-09-11 NOTE — Assessment & Plan Note (Signed)
-  Obtain BMP; forward to [facility] per patient's request -Obtain records from [facility]

## 2020-09-11 NOTE — Assessment & Plan Note (Signed)
-   Flu Vaccine QUAD 36+ mos IM

## 2020-09-11 NOTE — Telephone Encounter (Signed)
Attempted to call patient to discuss results. Will communicate via Mychart.   Roland Lipke Autry-Lott, DO 09/11/2020, 11:26 AM PGY-2, [facility]

## 2020-09-29 ENCOUNTER — Other Ambulatory Visit: Payer: Self-pay | Admitting: Internal Medicine

## 2020-10-13 ENCOUNTER — Ambulatory Visit: Payer: Medicare Other | Admitting: Internal Medicine

## 2020-10-17 ENCOUNTER — Encounter: Payer: Self-pay | Admitting: Internal Medicine

## 2020-10-17 ENCOUNTER — Other Ambulatory Visit: Payer: Self-pay

## 2020-10-17 ENCOUNTER — Ambulatory Visit: Payer: Medicare Other | Admitting: Internal Medicine

## 2020-10-17 VITALS — BP 116/62 | HR 66 | Ht 72.0 in | Wt 182.4 lb

## 2020-10-17 DIAGNOSIS — E782 Mixed hyperlipidemia: Secondary | ICD-10-CM | POA: Diagnosis not present

## 2020-10-17 DIAGNOSIS — I498 Other specified cardiac arrhythmias: Secondary | ICD-10-CM | POA: Diagnosis not present

## 2020-10-17 DIAGNOSIS — G90A Postural orthostatic tachycardia syndrome (POTS): Secondary | ICD-10-CM

## 2020-10-17 LAB — LIPID PANEL
Chol/HDL Ratio: 3.4 ratio (ref 0.0–5.0)
Cholesterol, Total: 117 mg/dL (ref 100–199)
HDL: 34 mg/dL — ABNORMAL LOW (ref >39)
LDL Chol Calc (NIH): 62 mg/dL (ref 0–99)
Triglycerides: 116 mg/dL (ref 0–149)
VLDL Cholesterol Cal: 21 mg/dL (ref 5–40)

## 2020-10-17 LAB — BASIC METABOLIC PANEL
BUN/Creatinine Ratio: 8 — ABNORMAL LOW (ref 10–24)
BUN: 11 mg/dL (ref 8–27)
CO2: 22 mmol/L (ref 20–29)
Calcium: 9 mg/dL (ref 8.6–10.2)
Chloride: 106 mmol/L (ref 96–106)
Creatinine, Ser: 1.34 mg/dL — ABNORMAL HIGH (ref 0.76–1.27)
GFR calc Af Amer: 65 mL/min/{1.73_m2} (ref >59)
GFR calc non Af Amer: 56 mL/min/{1.73_m2} — ABNORMAL LOW (ref >59)
Glucose: 94 mg/dL (ref 65–99)
Potassium: 4.4 mmol/L (ref 3.5–5.2)
Sodium: 139 mmol/L (ref 134–144)

## 2020-10-17 NOTE — Progress Notes (Signed)
Cardiology Office Note   Date:  10/17/2020   ID:  Jose Li, DOB [birth year], MRN [id number]  PCP:  Lavonda Jumbo, DO  Cardiologist:   Dietrich Pates, MD   F/U of dizziness, orthostatic intolerance    History of Present Illness: Jose Li is a 64 y.o. male with a history of dizziness, syncope, autonomic dysfuncton   I saw the pt in Nov 2020  The patient says he has been feeling prettyg good.   He denies dizzienss, no syncope   He says his breathing is good  He denies CP He has been tryying to stay hydrated   Followed by Dr Malen Gauze at [facility]   Walking some, but is  having problems with knees  Which has limited.  Allergies:   Duloxetine, Erythromycin, Hctz [hydrochlorothiazide], and Propranolol   Past Medical History:  Diagnosis Date  . Acute kidney injury (HCC) 05/26/2018  . Allergy   . Anemia   . Anxiety   . Depression   . Hyperlipidemia   . Hypertension, essential, benign 08/02/2012  . Orthostatic hypotension 10/09/2013  . OSA (obstructive sleep apnea) 10/01/2015   Severe OSA with an AHI of 86/hr now on BiPAP at 23/19cm H2O  . Seizures (HCC)    none for last 6-13months  . Substance abuse [facility])     Past Surgical History:  Procedure Laterality Date  . FRACTURE SURGERY  1990   right hip and femur  . HERNIA REPAIR  07/2009   umbilical hernia/ got infected  . JOINT REPLACEMENT Right 1990   right hip  . TRACHEOSTOMY  08/1999   Had pneumoniaand was in a coma 10 days     Social History:  The patient  reports that he has been smoking cigarettes. He has a 15.00 pack-year smoking history. He has never used smokeless tobacco. He reports that he does not drink alcohol and does not use drugs.   Family History:  The patient's family history includes Diabetes in his father; Heart disease in his brother and mother.    ROS:  Please see the history of present illness. All other systems are reviewed and  Negative to the above problem except as noted.    PHYSICAL  EXAM: VS:  BP 116/62   Pulse 66   Ht 6' (1.829 m)   Wt 182 lb 6.4 oz (82.7 kg)   SpO2 97%   BMI 24.74 kg/m   GEN: Pt is a 64 yo in NAD  HEENT: normal  Neck:   JVP is d    No  carotid bruits,  Cardiac: RRR; no murmurs.  No LE  edema    Respiratory:  clear to auscultation bilaterally, normal work of breathing GI: soft, No masses , nondistended, + BS  No hepatomegaly  MS: no deformity Moving all extremities   Skin: warm and dry, no rash    EKG:  EKG is ordered today.  SR 66 bpm    Lipid Panel    Component Value Date/Time   CHOL 137 01/12/2019 0957   TRIG 152 (H) 01/12/2019 0957   HDL 34 (L) 01/12/2019 0957   CHOLHDL 4.0 01/12/2019 0957   CHOLHDL 5.3 (H) 12/29/2015 1146   VLDL 79 (H) 12/29/2015 1146   LDLCALC 73 01/12/2019 0957      Wt Readings from Last 3 Encounters:  10/17/20 182 lb 6.4 oz (82.7 kg)  09/09/20 181 lb (82.1 kg)  06/03/20 180 lb (81.6 kg)      ASSESSMENT AND PLAN:  1  Autonomic dysfunction Pt is doing good on current regimen of midodrine   NOte that pindolol was stopped by renal and he notes no change in how he feels   Recomm he stay on same meds, stay hydrated      Will get BMET     2 CAD.  Mild plaqing on CT   Follow  No symptoms   Control risks  3 dyslipidemia ON statin  Check lipids today    4  Cr Labile   WIll check BMET today  Stay hydrated  5  Hypokalemia   check potassium  Does was cut back on     F/U in August 2022  Current medicines are reviewed at length with the patient today.  The patient does not have concerns regarding medicines.  Signed, Dietrich Pates, MD  10/17/2020 9:58 AM    [facility] [address] [place], [place], [place]  [address] Phone: [phone]; Fax: [phone]

## 2020-10-17 NOTE — Patient Instructions (Signed)
Medication Instructions:  No changes *If you need a refill on your cardiac medications before your next appointment, please call your pharmacy*   Lab Work: Today: bmet, lipids If you have labs (blood work) drawn today and your tests are completely normal, you will receive your results only by: Marland Kitchen MyChart Message (if you have MyChart) OR . A paper copy in the mail If you have any lab test that is abnormal or we need to change your treatment, we will call you to review the results.   Testing/Procedures: none   Follow-Up: At [facility], you and your health needs are our priority.  As part of our continuing mission to provide you with exceptional heart care, we have created designated Provider Care Teams.  These Care Teams include your primary Cardiologist (physician) and Advanced Practice Providers (APPs -  Physician Assistants and Nurse Practitioners) who all work together to provide you with the care you need, when you need it.  Your next appointment:   9 month(s)  The format for your next appointment:   In Person  Provider:   You may see Dietrich Pates, MD or one of the following Advanced Practice Providers on your designated Care Team:    Tereso Newcomer, PA-C  Chelsea Aus, [place]   Other Instructions

## 2020-10-31 ENCOUNTER — Other Ambulatory Visit: Payer: Self-pay | Admitting: *Deleted

## 2020-10-31 MED ORDER — MIDODRINE HCL 5 MG PO TABS
ORAL_TABLET | ORAL | 1 refills | Status: DC
Start: 2020-10-31 — End: 2021-01-01

## 2020-11-10 ENCOUNTER — Other Ambulatory Visit: Payer: Self-pay | Admitting: Neurology

## 2020-11-12 ENCOUNTER — Ambulatory Visit: Payer: Medicare Other | Admitting: Neurology

## 2020-12-03 ENCOUNTER — Other Ambulatory Visit: Payer: Self-pay | Admitting: Internal Medicine

## 2020-12-03 ENCOUNTER — Other Ambulatory Visit: Payer: Self-pay | Admitting: Neurology

## 2020-12-03 NOTE — Telephone Encounter (Signed)
[place] drug regeristry was checked, Pt is up to date on appointments and she has one coming up. Refill is appropriate.

## 2020-12-09 ENCOUNTER — Other Ambulatory Visit: Payer: Self-pay | Admitting: Family Medicine

## 2020-12-29 ENCOUNTER — Other Ambulatory Visit: Payer: Self-pay | Admitting: Nephrology

## 2020-12-29 ENCOUNTER — Encounter: Payer: Self-pay | Admitting: Neurology

## 2020-12-29 ENCOUNTER — Ambulatory Visit: Payer: Medicare Other | Admitting: Neurology

## 2020-12-29 ENCOUNTER — Other Ambulatory Visit: Payer: Self-pay

## 2020-12-29 VITALS — BP 127/76 | HR 88 | Ht 72.0 in | Wt 178.0 lb

## 2020-12-29 DIAGNOSIS — N400 Enlarged prostate without lower urinary tract symptoms: Secondary | ICD-10-CM

## 2020-12-29 DIAGNOSIS — R569 Unspecified convulsions: Secondary | ICD-10-CM

## 2020-12-29 MED ORDER — LEVETIRACETAM 750 MG PO TABS: 1500.0000 mg | ORAL_TABLET | Freq: Two times a day (BID) | ORAL | 4 refills | Status: DC

## 2020-12-29 MED ORDER — PREGABALIN 75 MG PO CAPS: 75.0000 mg | ORAL_CAPSULE | Freq: Two times a day (BID) | ORAL | 5 refills | Status: DC

## 2020-12-29 NOTE — Progress Notes (Signed)
I have read the note, and I agree with the clinical assessment and plan.  Charles K Willis   

## 2020-12-29 NOTE — Patient Instructions (Signed)
Continue current medications  See you back in 1 year  Call for seizures

## 2020-12-29 NOTE — Progress Notes (Signed)
PATIENT: Jose Li DOB: 1956-10-29  REASON FOR VISIT: follow up HISTORY FROM: patient  HISTORY OF PRESENT ILLNESS: Today 12/29/20 Jose Li is a 65 year old male with history of alcohol abuse and seizures.  Also history of orthostatic hypotension and OSA on BiPAP.  He remains on Keppra and Lyrica. No recurrent seizure.  He does not drive, he lives with his father who is 62.  He is followed by cardiology for orthostatic hypotension, denies any recent syncopal spells.  He has no complaints today.  His father drove him today, he is on disability, is not very active during the day, mostly stays home, takes care of cats.  Reports only occasional alcohol use.  Here today for evaluation unaccompanied.  HISTORY  11/13/2019 SS: Jose Li is a 65 year old male with history of alcohol abuse and history of seizures.  He also has history of orthostatic hypotension and OSA treated with BiPAP.  He remains on Keppra 750 mg tablets, 2 tablets twice a day.  He is also taking Lyrica 75 mg twice daily.  He reports he has not had recurrent seizure.  He says he has had 2 episodes of dizziness, likely related to his autonomic dysfunction, but no blackouts.  He says he is no longer drinking alcohol.  He does not drive a car.  He lives with his father who is 63 years old.  He says he is able to manage his medications.  He indicates his overall health has been well.  He is on disability, he does not do much during the day.  He presents today for evaluation unaccompanied.  REVIEW OF SYSTEMS: Out of a complete 14 system review of symptoms, the patient complains only of the following symptoms, and all other reviewed systems are negative.  N/A  ALLERGIES: Allergies  Allergen Reactions  . Duloxetine Other (See Comments)    Caused hyponatremia  . Erythromycin Other (See Comments)    Unknown reaction. The patient can not remember  . Hctz [Hydrochlorothiazide]     "Makes him faint" per patient Dr. Tenny Craw ([place]) took  him off  . Propranolol     "Makes him faint" per patient Dr. Tenny Craw () took him off      HOME MEDICATIONS: Outpatient Medications Prior to Visit  Medication Sig Dispense Refill  . acetaminophen (TYLENOL 8 HOUR) 650 MG CR tablet Take 1 tablet (650 mg total) by mouth every 8 (eight) hours as needed for pain. 90 tablet 11  . aspirin 81 MG EC tablet Take 81 mg by mouth daily.    Marland Kitchen atorvastatin (LIPITOR) 20 MG tablet Take 1 tablet by mouth once daily 90 tablet 2  . benztropine (COGENTIN) 1 MG tablet Take 1 mg by mouth daily.     . busPIRone (BUSPAR) 5 MG tablet Take 5 mg by mouth 2 (two) times daily.     . citalopram (CELEXA) 20 MG tablet Take 20 mg by mouth daily.    Burman Blacksmith ALLERGY RELIEF, CETIRIZINE, 10 MG tablet Take 1 tablet by mouth once daily 90 tablet 0  . midodrine (PROAMATINE) 5 MG tablet TAKE 2 TABLETS BY MOUTH ONCE DAILY AT 6 AM THEN 1  DAILY AT 10 AM AND 2 PM. Please keep upcoming appt in December with Dr. Tenny Craw before anymore refills. Thank you 120 tablet 1  . Multiple Vitamin (MULTIVITAMIN) tablet Take 1 tablet by mouth daily.    . potassium chloride SA (KLOR-CON) 20 MEQ tablet TAKE 2 TABLETS BY MOUTH IN THE MORNING AND  1 TABLET IN THE EVENING (Patient taking differently: 20 mEq daily. Take 1 tab in am 1 tab in pm) 270 tablet 2  . risperiDONE (RISPERDAL) 2 MG tablet Take 2 mg by mouth at bedtime.     . tamsulosin (FLOMAX) 0.4 MG CAPS capsule Take 0.4 mg by mouth daily.    Marland Kitchen thiamine 100 MG tablet Take 100 mg by mouth daily.    . traZODone (DESYREL) 150 MG tablet Take 150 mg by mouth at bedtime.     . levETIRAcetam (KEPPRA) 750 MG tablet Take 2 tablets by mouth twice daily 360 tablet 0  . pregabalin (LYRICA) 75 MG capsule Take 1 capsule by mouth twice daily 60 capsule 0   No facility-administered medications prior to visit.    PAST MEDICAL HISTORY: Past Medical History:  Diagnosis Date  . Acute kidney injury (HCC) 05/26/2018  . Allergy   . Anemia   . Anxiety   .  Depression   . Hyperlipidemia   . Hypertension, essential, benign 08/02/2012  . Orthostatic hypotension 10/09/2013  . OSA (obstructive sleep apnea) 10/01/2015   Severe OSA with an AHI of 86/hr now on BiPAP at 23/19cm H2O  . Seizures (HCC)    none for last 6-55months  . Substance abuse (HCC)     PAST SURGICAL HISTORY: Past Surgical History:  Procedure Laterality Date  . FRACTURE SURGERY  1990   right hip and femur  . HERNIA REPAIR  07/2009   umbilical hernia/ got infected  . JOINT REPLACEMENT Right 1990   right hip  . TRACHEOSTOMY  08/1999   Had pneumoniaand was in a coma 10 days    FAMILY HISTORY: Family History  Problem Relation Age of Onset  . Heart disease Mother   . Diabetes Father   . Heart disease Brother   . Hypertension Neg Hx   . Stroke Neg Hx     SOCIAL HISTORY: Social History   Socioeconomic History  . Marital status: Single    Spouse name: Not on file  . Number of children: Not on file  . Years of education: Not on file  . Highest education level: Not on file  Occupational History  . Occupation: diability  Tobacco Use  . Smoking status: Current Every Day Smoker    Packs/day: 0.50    Years: 30.00    Pack years: 15.00    Types: Cigarettes  . Smokeless tobacco: Never Used  . Tobacco comment: Had cut back, but stress has prompted him to smoke again  Vaping Use  . Vaping Use: Never used  Substance and Sexual Activity  . Alcohol use: No    Comment: history of alcohol abuse.  . Drug use: No  . Sexual activity: Not Currently  Other Topics Concern  . Not on file  Social History Narrative   Lives with 77 year old Father, [place] [employer]. Watches TV for fun. Likes Social worker and order.      His only son lives with his mother in [place], and attends [employer].    Social Determinants of Health   Financial Resource Strain: Not on file  Food Insecurity: Not on file  Transportation Needs: Not on file  Physical Activity: Not on file  Stress: Not on file   Social Connections: Not on file  Intimate Partner Violence: Not on file   PHYSICAL EXAM  Vitals:   12/29/20 1057  BP: 127/76  Pulse: 88  Weight: 178 lb (80.7 kg)  Height: 6' (1.829 m)   Body mass  index is 24.14 kg/m.  Generalized: Well developed, in no acute distress   Neurological examination  Mentation: Alert oriented to time, place, history taking. Follows all commands speech and language fluent Cranial nerve II-XII: Pupils were equal round reactive to light. Extraocular movements were full, visual field were full on confrontational test. Facial sensation and strength were normal. Head turning and shoulder shrug  were normal and symmetric. Motor: The motor testing reveals 5 over 5 strength of all 4 extremities. Good symmetric motor tone is noted throughout.  Sensory: Sensory testing is intact to soft touch on all 4 extremities. No evidence of extinction is noted.  Coordination: Cerebellar testing reveals good finger-nose-finger and heel-to-shin bilaterally.  Gait and station: Gait is normal. Tandem gait is normal. Romberg is negative. No drift is seen.  Reflexes: Deep tendon reflexes are symmetric and normal bilaterally.   DIAGNOSTIC DATA (LABS, IMAGING, TESTING) - I reviewed patient records, labs, notes, testing and imaging myself where available.  Lab Results  Component Value Date   WBC 9.2 10/05/2019   HGB 12.6 (L) 10/05/2019   HCT 36.2 (L) 10/05/2019   MCV 84 10/05/2019   PLT 293 10/05/2019      Component Value Date/Time   NA 139 10/17/2020 1013   K 4.4 10/17/2020 1013   CL 106 10/17/2020 1013   CO2 22 10/17/2020 1013   GLUCOSE 94 10/17/2020 1013   GLUCOSE 89 08/10/2017 1533   BUN 11 10/17/2020 1013   CREATININE 1.34 (H) 10/17/2020 1013   CREATININE 0.94 06/10/2016 1613   CALCIUM 9.0 10/17/2020 1013   PROT 6.6 01/23/2018 1022   ALBUMIN 4.3 01/23/2018 1022   AST 21 01/23/2018 1022   ALT 31 01/23/2018 1022   ALKPHOS 55 01/23/2018 1022   BILITOT 0.3  01/23/2018 1022   GFRNONAA 56 (L) 10/17/2020 1013   GFRNONAA 84 02/17/2016 1011   GFRAA 65 10/17/2020 1013   GFRAA >89 02/17/2016 1011   Lab Results  Component Value Date   CHOL 117 10/17/2020   HDL 34 (L) 10/17/2020   LDLCALC 62 10/17/2020   TRIG 116 10/17/2020   CHOLHDL 3.4 10/17/2020   Lab Results  Component Value Date   HGBA1C 5.6 12/29/2015   Lab Results  Component Value Date   VITAMINB12 473 02/06/2016   Lab Results  Component Value Date   TSH 1.518 02/06/2016   ASSESSMENT AND PLAN 65 y.o. year old male  has a past medical history of Acute kidney injury (HCC) (05/26/2018), Allergy, Anemia, Anxiety, Depression, Hyperlipidemia, Hypertension, essential, benign (08/02/2012), Orthostatic hypotension (10/09/2013), OSA (obstructive sleep apnea) (10/01/2015), Seizures (HCC), and Substance abuse (HCC). here with:  1.  History of seizures 2.  Orthostatic hypotension  -No recurrent seizures since last seen -Continue Keppra 750 mg tablet, 2 tablets twice a day -Continue Lyrica 75 mg twice a day, drug registry checked, refill sent -Call for recurrent seizure, otherwise follow-up 1 year or sooner if needed  I spent 20 minutes of face-to-face and non-face-to-face time with patient.  This included previsit chart review, lab review, study review, order entry, electronic health record documentation, patient education.  Margie Ege, AGNP-C, DNP 12/29/2020, 11:16 AM [facility] [address] [place], [place] [address] [phone]

## 2020-12-31 ENCOUNTER — Other Ambulatory Visit: Payer: Self-pay | Admitting: Neurology

## 2020-12-31 ENCOUNTER — Other Ambulatory Visit: Payer: Self-pay | Admitting: Internal Medicine

## 2021-01-07 ENCOUNTER — Other Ambulatory Visit: Payer: Self-pay

## 2021-01-07 ENCOUNTER — Ambulatory Visit
Admission: RE | Admit: 2021-01-07 | Discharge: 2021-01-07 | Disposition: A | Discharge status: Home / Self Care | Payer: Medicare Other | Source: Ambulatory Visit | Attending: Nephrology | Admitting: Nephrology

## 2021-01-07 DIAGNOSIS — N400 Enlarged prostate without lower urinary tract symptoms: Secondary | ICD-10-CM

## 2021-01-07 DIAGNOSIS — N281 Cyst of kidney, acquired: Secondary | ICD-10-CM | POA: Diagnosis not present

## 2021-03-03 ENCOUNTER — Other Ambulatory Visit: Payer: Self-pay | Admitting: Family Medicine

## 2021-05-13 ENCOUNTER — Encounter: Payer: Self-pay | Admitting: Internal Medicine

## 2021-06-04 ENCOUNTER — Telehealth: Payer: Medicare Other | Admitting: Cardiology

## 2021-06-04 ENCOUNTER — Encounter: Payer: Medicare Other | Admitting: Cardiology

## 2021-06-04 ENCOUNTER — Other Ambulatory Visit: Payer: Self-pay

## 2021-06-04 ENCOUNTER — Encounter: Payer: Self-pay | Admitting: Cardiology

## 2021-06-04 NOTE — Progress Notes (Signed)
This encounter was created in error - please disregard.

## 2021-06-07 ENCOUNTER — Other Ambulatory Visit: Payer: Self-pay | Admitting: Family Medicine

## 2021-06-29 ENCOUNTER — Other Ambulatory Visit: Payer: Self-pay | Admitting: Neurology

## 2021-06-30 DIAGNOSIS — H2513 Age-related nuclear cataract, bilateral: Secondary | ICD-10-CM | POA: Diagnosis not present

## 2021-06-30 DIAGNOSIS — H524 Presbyopia: Secondary | ICD-10-CM | POA: Diagnosis not present

## 2021-06-30 DIAGNOSIS — H5203 Hypermetropia, bilateral: Secondary | ICD-10-CM | POA: Diagnosis not present

## 2021-07-22 ENCOUNTER — Ambulatory Visit (INDEPENDENT_AMBULATORY_CARE_PROVIDER_SITE_OTHER): Payer: Medicare Other | Admitting: Cardiology

## 2021-07-22 ENCOUNTER — Other Ambulatory Visit: Payer: Self-pay

## 2021-07-22 VITALS — BP 146/68 | HR 83 | Ht 72.0 in | Wt 175.2 lb

## 2021-07-22 DIAGNOSIS — I10 Essential (primary) hypertension: Secondary | ICD-10-CM | POA: Diagnosis not present

## 2021-07-22 DIAGNOSIS — G4733 Obstructive sleep apnea (adult) (pediatric): Secondary | ICD-10-CM | POA: Diagnosis not present

## 2021-07-22 NOTE — Patient Instructions (Signed)
Medication Instructions:  Your physician recommends that you continue on your current medications as directed. Please refer to the Current Medication list given to you today.  *If you need a refill on your cardiac medications before your next appointment, please call your pharmacy*   Follow-Up: At [facility], you and your health needs are our priority.  As part of our continuing mission to provide you with exceptional heart care, we have created designated Provider Care Teams.  These Care Teams include your primary Cardiologist (physician) and Advanced Practice Providers (APPs -  Physician Assistants and Nurse Practitioners) who all work together to provide you with the care you need, when you need it.  Your next appointment:   4 week(s) after receiving your new device  The format for your next appointment:   In Person  Provider:   Armanda Magic, MD   Other Instructions Dr. Mayford Knife has ordered you a new BiPAP machine.

## 2021-07-22 NOTE — Progress Notes (Signed)
Evaluation Performed:  Follow-up visit  Date:  07/22/2021   ID:  Jose Li, DOB 1956/09/02, MRN [id number]   PCP:  Lavonda Jumbo, DO  Cardiologist:  Dietrich Pates, MD  Sleep Medicine:  Armanda Magic, MD Electrophysiologist:  None   Chief Complaint:  OSA  History of Present Illness:    Jose Li is a 65 y.o. male with a hx of severe OSA with an AHI of 86/hr.  He is on  BiPAP at 23/19cm H2O. Unfortunately his device broke down about a year ago and he has not used his device since then.  He says that it stopped blowing air and the heating coil was not working.  He called his DME to get a new device but he never heard anything.   Prior CV studies:   The following studies were reviewed today:  PAP download  Past Medical History:  Diagnosis Date   Acute kidney injury (HCC) 05/26/2018   Allergy    Anemia    Anxiety    Depression    Hyperlipidemia    Hypertension, essential, benign 08/02/2012   Orthostatic hypotension 10/09/2013   OSA (obstructive sleep apnea) 10/01/2015   Severe OSA with an AHI of 86/hr now on BiPAP at 23/19cm H2O   Seizures (HCC)    none for last 6-62months   Substance abuse [facility])    Past Surgical History:  Procedure Laterality Date   FRACTURE SURGERY  1990   right hip and femur   HERNIA REPAIR  07/2009   umbilical hernia/ got infected   JOINT REPLACEMENT Right 1990   right hip   TRACHEOSTOMY  08/1999   Had pneumoniaand was in a coma 10 days     Current Meds  Medication Sig   acetaminophen (TYLENOL 8 HOUR) 650 MG CR tablet Take 1 tablet (650 mg total) by mouth every 8 (eight) hours as needed for pain.   aspirin 81 MG EC tablet Take 81 mg by mouth daily.   atorvastatin (LIPITOR) 20 MG tablet Take 1 tablet by mouth once daily   benztropine (COGENTIN) 1 MG tablet Take 1 mg by mouth daily.    busPIRone (BUSPAR) 5 MG tablet Take 5 mg by mouth 2 (two) times daily.    citalopram (CELEXA) 20 MG tablet Take 20 mg by mouth daily.   EQ ALLERGY RELIEF,  CETIRIZINE, 10 MG tablet Take 1 tablet by mouth once daily   levETIRAcetam (KEPPRA) 750 MG tablet Take 2 tablets (1,500 mg total) by mouth 2 (two) times daily.   midodrine (PROAMATINE) 5 MG tablet TAKE 2 TABLETS BY MOUTH DAILY AT  6 AM , THEN 1  DAILY AT 10 AM  AND  2 PM   Multiple Vitamin (MULTIVITAMIN) tablet Take 1 tablet by mouth daily.   potassium chloride SA (KLOR-CON) 20 MEQ tablet Take 20 mEq by mouth daily.   pregabalin (LYRICA) 75 MG capsule Take 1 capsule by mouth twice daily   risperiDONE (RISPERDAL) 2 MG tablet Take 2 mg by mouth at bedtime.    tamsulosin (FLOMAX) 0.4 MG CAPS capsule Take 0.4 mg by mouth daily.   thiamine 100 MG tablet Take 100 mg by mouth daily.   traZODone (DESYREL) 150 MG tablet Take 150 mg by mouth at bedtime.      Allergies:   Duloxetine, Erythromycin, Hctz [hydrochlorothiazide], and Propranolol   Social History   Tobacco Use   Smoking status: Every Day    Packs/day: 0.50    Years: 30.00  Pack years: 15.00    Types: Cigarettes   Smokeless tobacco: Never   Tobacco comments:    Had cut back, but stress has prompted him to smoke again  Vaping Use   Vaping Use: Never used  Substance Use Topics   Alcohol use: No    Comment: history of alcohol abuse.   Drug use: No     Family Hx: The patient's family history includes Diabetes in his father; Heart disease in his brother and mother. There is no history of Hypertension or Stroke.  ROS:   Please see the history of present illness.     All other systems reviewed and are negative.   Labs/Other Tests and Data Reviewed:    Recent Labs: 10/17/2020: BUN 11; Creatinine, Ser 1.34; Potassium 4.4; Sodium 139   Recent Lipid Panel Lab Results  Component Value Date/Time   CHOL 117 10/17/2020 10:13 AM   TRIG 116 10/17/2020 10:13 AM   HDL 34 (L) 10/17/2020 10:13 AM   CHOLHDL 3.4 10/17/2020 10:13 AM   CHOLHDL 5.3 (H) 12/29/2015 11:46 AM   LDLCALC 62 10/17/2020 10:13 AM    Wt Readings from Last 3  Encounters:  07/22/21 175 lb 3.2 oz (79.5 kg)  06/04/21 175 lb (79.4 kg)  12/29/20 178 lb (80.7 kg)     Objective:    Vital Signs:  BP (!) 146/68   Pulse 83   Ht 6' (1.829 m)   Wt 175 lb 3.2 oz (79.5 kg)   SpO2 98%   BMI 23.76 kg/m   GEN: Well nourished, well developed in no acute distress HEENT: Normal NECK: No JVD; No carotid bruits LYMPHATICS: No lymphadenopathy CARDIAC:RRR, no murmurs, rubs, gallops RESPIRATORY:  Clear to auscultation without rales, wheezing or rhonchi  ABDOMEN: Soft, non-tender, non-distended MUSCULOSKELETAL:  No edema; No deformity  SKIN: Warm and dry NEUROLOGIC:  Alert and oriented x 3 PSYCHIATRIC:  Normal affect   ASSESSMENT & PLAN:    1.  OSA  -he has severe OSA with AHI 86/hr and had been on BiPAP but his device broke over a year ago and he never got it fixed -He had been on BiPAP 23/19cm H2O but I will order a new auto ResMed BIPAP with IPAP max 25cm H2O and EPAP min 5cm H2O and PS 4cm H2O -I will see him back about 4 weeks after he gets his new device   2.  HTN -BP is borderline controlled -his renal MD took him off of the Pindolol -he sees his nephrologist in 2 weeks at which time BP can be addressed since they are managing it  COVID-19 Education: The signs and symptoms of COVID-19 were discussed with the patient and how to seek care for testing (follow up with PCP or arrange E-visit).  The importance of social distancing was discussed today.  Patient Risk:   After full review of this patient's clinical status, I feel that they are at least moderate risk at this time.  Time:   Today, I have spent 15 minutes on telemedicine discussing medical problems including OSA and HTN and reviewing patient's chart including PAP compliance download.  Medication Adjustments/Labs and Tests Ordered: Current medicines are reviewed at length with the patient today.  Concerns regarding medicines are outlined above.  Tests Ordered: No orders of the defined  types were placed in this encounter.  Medication Changes: No orders of the defined types were placed in this encounter.   Disposition:  Follow up in 1 year(s)  Signed, Armanda Magic,  MD  07/22/2021 10:08 AM    [facility]

## 2021-08-03 DIAGNOSIS — N1832 Chronic kidney disease, stage 3b: Secondary | ICD-10-CM | POA: Diagnosis not present

## 2021-08-07 ENCOUNTER — Telehealth: Payer: Self-pay | Admitting: *Deleted

## 2021-08-07 DIAGNOSIS — G4733 Obstructive sleep apnea (adult) (pediatric): Secondary | ICD-10-CM

## 2021-08-07 NOTE — Telephone Encounter (Signed)
Order placed to [facility] via community message 

## 2021-08-07 NOTE — Telephone Encounter (Signed)
-----   Message from [place], [place] sent at 07/22/2021 10:31 AM EDT ----- See orders below from Dr. Mayford Knife: I will order a new auto ResMed BIPAP with IPAP max 25cm H2O and EPAP min 5cm H2O and PS 4cm H2O -I will see him back about 4 weeks after he gets his new device  Thanks!

## 2021-08-11 DIAGNOSIS — G909 Disorder of the autonomic nervous system, unspecified: Secondary | ICD-10-CM | POA: Diagnosis not present

## 2021-08-11 DIAGNOSIS — Z23 Encounter for immunization: Secondary | ICD-10-CM | POA: Diagnosis not present

## 2021-08-11 DIAGNOSIS — I129 Hypertensive chronic kidney disease with stage 1 through stage 4 chronic kidney disease, or unspecified chronic kidney disease: Secondary | ICD-10-CM | POA: Diagnosis not present

## 2021-08-11 DIAGNOSIS — N281 Cyst of kidney, acquired: Secondary | ICD-10-CM | POA: Diagnosis not present

## 2021-08-11 DIAGNOSIS — N1831 Chronic kidney disease, stage 3a: Secondary | ICD-10-CM | POA: Diagnosis not present

## 2021-08-11 DIAGNOSIS — E876 Hypokalemia: Secondary | ICD-10-CM | POA: Diagnosis not present

## 2021-08-22 ENCOUNTER — Other Ambulatory Visit: Payer: Self-pay | Admitting: Internal Medicine

## 2021-09-02 NOTE — Telephone Encounter (Signed)
Fax came from [facility] stating they were unable to process the referral at this time because the office notes for 07/22/21 mentioned the patient had not been using his Bipap for 1 yr and due to noncompliance the patient will need to complete a new titration study to qualify.

## 2021-09-02 NOTE — Addendum Note (Signed)
Addended by: Reesa Chew on: 09/02/2021 10:16 AM   Modules accepted: Orders

## 2021-09-02 NOTE — Telephone Encounter (Signed)
Notification or Prior Authorization is not required for the requested services  Decision ID #:[id number] VALID: 09/15/21--11-14-21

## 2021-09-05 ENCOUNTER — Other Ambulatory Visit: Payer: Self-pay | Admitting: Family Medicine

## 2021-09-14 ENCOUNTER — Other Ambulatory Visit: Payer: Self-pay

## 2021-09-14 ENCOUNTER — Ambulatory Visit (INDEPENDENT_AMBULATORY_CARE_PROVIDER_SITE_OTHER): Payer: Medicare Other | Admitting: Internal Medicine

## 2021-09-14 ENCOUNTER — Encounter: Payer: Self-pay | Admitting: Internal Medicine

## 2021-09-14 VITALS — BP 130/60 | HR 87 | Ht 72.0 in | Wt 177.6 lb

## 2021-09-14 DIAGNOSIS — I10 Essential (primary) hypertension: Secondary | ICD-10-CM

## 2021-09-14 DIAGNOSIS — G4733 Obstructive sleep apnea (adult) (pediatric): Secondary | ICD-10-CM

## 2021-09-14 DIAGNOSIS — E782 Mixed hyperlipidemia: Secondary | ICD-10-CM

## 2021-09-14 LAB — LIPID PANEL
Chol/HDL Ratio: 3.2 ratio (ref 0.0–5.0)
Cholesterol, Total: 114 mg/dL (ref 100–199)
HDL: 36 mg/dL — ABNORMAL LOW (ref >39)
LDL Chol Calc (NIH): 53 mg/dL (ref 0–99)
Triglycerides: 147 mg/dL (ref 0–149)
VLDL Cholesterol Cal: 25 mg/dL (ref 5–40)

## 2021-09-14 LAB — CBC
Hematocrit: 38.5 % (ref 37.5–51.0)
Hemoglobin: 13.1 g/dL (ref 13.0–17.7)
MCH: 30.1 pg (ref 26.6–33.0)
MCHC: 34 g/dL (ref 31.5–35.7)
MCV: 89 fL (ref 79–97)
Platelets: 283 10*3/uL (ref 150–450)
RBC: 4.35 x10E6/uL (ref 4.14–5.80)
RDW: 12.2 % (ref 11.6–15.4)
WBC: 9.3 10*3/uL (ref 3.4–10.8)

## 2021-09-14 LAB — BASIC METABOLIC PANEL
BUN/Creatinine Ratio: 5 — ABNORMAL LOW (ref 10–24)
BUN: 6 mg/dL — ABNORMAL LOW (ref 8–27)
CO2: 23 mmol/L (ref 20–29)
Calcium: 9.2 mg/dL (ref 8.6–10.2)
Chloride: 107 mmol/L — ABNORMAL HIGH (ref 96–106)
Creatinine, Ser: 1.27 mg/dL (ref 0.76–1.27)
Glucose: 92 mg/dL (ref 70–99)
Potassium: 4.3 mmol/L (ref 3.5–5.2)
Sodium: 141 mmol/L (ref 134–144)
eGFR: 63 mL/min/{1.73_m2} (ref >59)

## 2021-09-14 NOTE — Progress Notes (Signed)
Cardiology Office Note   Date:  09/14/2021   ID:  [name], DOB 04/22/1956, MRN [id number]  PCP:  Lavonda Jumbo, DO  Cardiologist:   Dietrich Pates, MD   F/U of dizziness, orthostatic intolerance    History of Present Illness: Jose Li is a 65 y.o. male with a history of dizziness, syncope, autonomic dysfuncton   I saw the pt in Srping 2022    SInce seen he says he has had a few episodes of dizziness  One was pretty bad   But no syncope   Others were minor  The patient says he is drinking plenty of fluids   NO changes in meds    Allergies:   Duloxetine, Erythromycin, Hctz [hydrochlorothiazide], and Propranolol   Past Medical History:  Diagnosis Date   Acute kidney injury (HCC) 05/26/2018   Allergy    Anemia    Anxiety    Depression    Hyperlipidemia    Hypertension, essential, benign 08/02/2012   Orthostatic hypotension 10/09/2013   OSA (obstructive sleep apnea) 10/01/2015   Severe OSA with an AHI of 86/hr now on BiPAP at 23/19cm H2O   Seizures (HCC)    none for last 6-61months   Substance abuse [facility])     Past Surgical History:  Procedure Laterality Date   FRACTURE SURGERY  1990   right hip and femur   HERNIA REPAIR  07/2009   umbilical hernia/ got infected   JOINT REPLACEMENT Right 1990   right hip   TRACHEOSTOMY  08/1999   Had pneumoniaand was in a coma 10 days     Social History:  The patient  reports that he has been smoking cigarettes. He has a 15.00 pack-year smoking history. He has never used smokeless tobacco. He reports that he does not drink alcohol and does not use drugs.   Family History:  The patient's family history includes Diabetes in his father; Heart disease in his brother and mother.    ROS:  Please see the history of present illness. All other systems are reviewed and  Negative to the above problem except as noted.    PHYSICAL EXAM: VS:  BP 130/60   Pulse 87   Ht 6' (1.829 m)   Wt 177 lb 9.6 oz (80.6 kg)   SpO2 98%   BMI  24.09 kg/m   GEN: Pt is a 65 yo in NAD  HEENT: normal  Neck:   JVP is d    No  carotid bruits,  Cardiac: RRR; no murmurs.  No LE  edema    Respiratory:  clear to auscultation bilaterally, GI: soft, No masses , nondistended, + BS  No hepatomegaly  MS: no deformity Moving all extremities   Skin: warm and dry, no rash    EKG:  EKG is not ordered today.    Lipid Panel    Component Value Date/Time   CHOL 117 10/17/2020 1013   TRIG 116 10/17/2020 1013   HDL 34 (L) 10/17/2020 1013   CHOLHDL 3.4 10/17/2020 1013   CHOLHDL 5.3 (H) 12/29/2015 1146   VLDL 79 (H) 12/29/2015 1146   LDLCALC 62 10/17/2020 1013      Wt Readings from Last 3 Encounters:  09/14/21 177 lb 9.6 oz (80.6 kg)  07/22/21 175 lb 3.2 oz (79.5 kg)  06/04/21 175 lb (79.4 kg)      ASSESSMENT AND PLAN: 1  Autonomic dysfunction Pt doing well on current regimen Occasional spells   Stays hydartd  2 CAD.  Mild plaqing on CT   Follow  No symptoms   Control risks  Lipdis good in December    3 dyslipidemia ON statin Follow in spring     4  Cr Labile   WIll check BMET today  Will check CBC as well    5  Hypokalemia  Check     F/U next summer    Current medicines are reviewed at length with the patient today.  The patient does not have concerns regarding medicines.  Signed, Dietrich Pates, MD  09/14/2021 10:44 AM    [facility] [address] [place], [place], [place]  [address] Phone: [phone]; Fax: [phone]

## 2021-09-14 NOTE — Patient Instructions (Signed)
Medication Instructions:   Your physician recommends that you continue on your current medications as directed. Please refer to the Current Medication list given to you today.  *If you need a refill on your cardiac medications before your next appointment, please call your pharmacy*   Lab Work:  TODAY!!!!!  Bmet/cbc/lipid.  If you have labs (blood work) drawn today and your tests are completely normal, you will receive your results only by: MyChart Message (if you have MyChart) OR A paper copy in the mail If you have any lab test that is abnormal or we need to change your treatment, we will call you to review the results.   Testing/Procedures:  -None   Follow-Up: At [facility], you and your health needs are our priority.  As part of our continuing mission to provide you with exceptional heart care, we have created designated Provider Care Teams.  These Care Teams include your primary Cardiologist (physician) and Advanced Practice Providers (APPs -  Physician Assistants and Nurse Practitioners) who all work together to provide you with the care you need, when you need it.  We recommend signing up for the patient portal called "MyChart".  Sign up information is provided on this After Visit Summary.  MyChart is used to connect with patients for Virtual Visits (Telemedicine).  Patients are able to view lab/test results, encounter notes, upcoming appointments, etc.  Non-urgent messages can be sent to your provider as well.   To learn more about what you can do with MyChart, go to [email or web address].    Your next appointment:   8 month(s)  The format for your next appointment:   In Person  Provider:   Dietrich Pates, MD   Other Instructions Your physician wants you to follow-up in: 8 month with Dr.Ross.  You will receive a reminder letter in the mail two months in advance. If you don't receive a letter, please call our office to schedule the follow-up appointment.

## 2021-09-28 NOTE — Telephone Encounter (Signed)
Patient is scheduled for CPAP Titration on 10/26/21. Patient understands his titration study will be done at [facility] sleep lab. Patient understands he will receive a letter in a week or so detailing appointment, date, time, and location. Patient understands to call if he does not receive the letter  in a timely manner. Left detailed message on voicemail with date and time of SS and informed patient to call back to confirm or reschedule.

## 2021-10-12 ENCOUNTER — Ambulatory Visit (INDEPENDENT_AMBULATORY_CARE_PROVIDER_SITE_OTHER): Payer: Medicare Other | Admitting: Family Medicine

## 2021-10-12 ENCOUNTER — Other Ambulatory Visit: Payer: Self-pay

## 2021-10-12 ENCOUNTER — Encounter: Payer: Self-pay | Admitting: Family Medicine

## 2021-10-12 VITALS — BP 125/80 | HR 100 | Ht 72.0 in | Wt 176.6 lb

## 2021-10-12 DIAGNOSIS — W108XXA Fall (on) (from) other stairs and steps, initial encounter: Secondary | ICD-10-CM

## 2021-10-12 DIAGNOSIS — R0781 Pleurodynia: Secondary | ICD-10-CM

## 2021-10-12 MED ORDER — OXYCODONE HCL 5 MG PO TABS: 5.0000 mg | ORAL_TABLET | ORAL | 0 refills | Status: AC | PRN

## 2021-10-12 NOTE — Progress Notes (Signed)
    SUBJECTIVE:   CHIEF COMPLAINT / HPI:   Jose Li is a 65 yo M who presents after a fall. He was getting out of bed Saturday morning when he slipped on his bedside stairs and hit his ribs on the left side.  He did not seek care at the time of the incident.  The pain has been unchanged since the incident.  He has tried heat, Tylenol, and Aleve cream.  He denies hitting his head in the fall and he admits to having fell in the same way on his right side of his abdomen prior.  PERTINENT  PMH / PSH: HTN, CKD, tobacco use disorder  OBJECTIVE:   BP 125/80   Pulse 100   Wt 176 lb 9.6 oz (80.1 kg)   SpO2 98%   BMI 23.95 kg/m   General: Appears well, no acute distress. Age appropriate. Cardiac: RRR, normal heart sounds, no murmurs Respiratory: CTAB, normal effort. No wheezing rales rhonchi.  Chest/Abdomen: left chest wall with noticeable edema over anterior ribs without overlying bruising. It is tender to palpation but without abnormal rib motion with inspiration.  Skin: Warm and dry, no rashes noted Neuro: alert and oriented Psych: normal affect  ASSESSMENT/PLAN:   Rib pain 2/2 Fall (on) (from) other stairs and steps, initial encounter Fall on bedside stairs 3 days prior without head injury. He is neurologically appropriate and has a left chest wall swelling that is well localized to area of injury; no overlying bruising. Discussed case with Dr. Miquel Dunn at time of visit. She evaluated the patient and did not think CXR was necessary at this time. I agreed with this plan. He is a known smoker and potential for PNA is high but he is moving air well and there is low suspicion for rib fracture at this time. Discussed  return precautions with patient such as increase WOB and fever. Will give a few days of pain medication at this time.  - oxyCODONE (OXY IR/ROXICODONE) 5 MG immediate release tablet; Take 1 tablet (5 mg total) by mouth every 4 (four) hours as needed for up to 3 days for severe pain.   Dispense: 18 tablet; Refill: 0  Lavonda Jumbo, DO [place] [facility]

## 2021-10-12 NOTE — Patient Instructions (Addendum)
You for coming in today.  I am so sorry you are not feeling well.  I have prescribed oxycodone 5 mg every 4 hours as needed for pain.  You can continue to use heat and Tylenol for pain relief as well.  If you develop cough, fever, shortness of breath please return to care.  If pain fails to improve please return to care.  Dr. Salvadore Dom

## 2021-10-26 ENCOUNTER — Other Ambulatory Visit: Payer: Self-pay

## 2021-10-26 ENCOUNTER — Ambulatory Visit (HOSPITAL_BASED_OUTPATIENT_CLINIC_OR_DEPARTMENT_OTHER): Payer: Medicare Other | Attending: Cardiology | Admitting: Cardiology

## 2021-10-26 DIAGNOSIS — R0683 Snoring: Secondary | ICD-10-CM | POA: Diagnosis not present

## 2021-10-26 DIAGNOSIS — G4733 Obstructive sleep apnea (adult) (pediatric): Secondary | ICD-10-CM | POA: Insufficient documentation

## 2021-11-03 NOTE — Procedures (Signed)
° ° °  Patient Name: Jose Li, Jose Li Date: 10/26/2021 Gender: Male D.O.B: [birth year] Age (years): 22 Referring Provider: Armanda Magic MD, ABSM Height (inches): 72 Interpreting Physician: Armanda Magic MD, ABSM Weight (lbs): 174 RPSGT: Rosette Reveal BMI: 24 MRN: [id number] Neck Size: 16.00  CLINICAL INFORMATION The patient is referred for a CPAP titration to treat sleep apnea.  SLEEP STUDY TECHNIQUE As per the AASM Manual for the Scoring of Sleep and Associated Events v2.3 (April 2016) with a hypopnea requiring 4% desaturations.  The channels recorded and monitored were frontal, central and occipital EEG, electrooculogram (EOG), submentalis EMG (chin), nasal and oral airflow, thoracic and abdominal wall motion, anterior tibialis EMG, snore microphone, electrocardiogram, and pulse oximetry. Continuous positive airway pressure (CPAP) was initiated at the beginning of the study and titrated to treat sleep-disordered breathing.  MEDICATIONS Medications self-administered by patient taken the night of the study : TRAZODONE  TECHNICIAN COMMENTS Comments added by technician: None Comments added by scorer: N/A  RESPIRATORY PARAMETERS Optimal PAP Pressure (cm): 9  AHI at Optimal Pressure (/hr):0 Overall Minimal O2 (%):94.0  Supine % at Optimal Pressure (%):0 Minimal O2 at Optimal Pressure (%): 94.0   SLEEP ARCHITECTURE The study was initiated at 10:48:13 PM and ended at 4:51:37 AM.  Sleep onset time was 30.3 minutes and the sleep efficiency was 78.0%. The total sleep time was 283.5 minutes.  The patient spent 5.8% of the night in stage N1 sleep, 66.%% in stage N2 sleep, 0.0%% in stage N3 and 27.9% in REM.Stage REM latency was 100.0 minutes  Wake after sleep onset was 49.6. Alpha intrusion was absent. Supine sleep was 0.00%.  CARDIAC DATA The 2 lead EKG demonstrated sinus rhythm. The mean heart rate was N/A beats per minute. Other EKG findings include: None.  LEG MOVEMENT  DATA The total Periodic Limb Movements of Sleep (PLMS) were 0. The PLMS index was 0.0. A PLMS index of <15 is considered normal in adults.  IMPRESSIONS - The optimal PAP pressure was 9 cm of water. - Significant oxygen desaturations were not observed during this titration (min O2 = 94.0%). - The patient snored with moderate snoring volume during this titration study. - No cardiac abnormalities were observed during this study. - Clinically significant periodic limb movements were not noted during this study. Arousals associated with PLMs were rare.  DIAGNOSIS - Obstructive Sleep Apnea (G47.33)  RECOMMENDATIONS - Trial of CPAP therapy on 9 cm H2O with a Medium size Fisher&Paykel Full Face Mask Simplus mask and heated humidification. - Avoid alcohol, sedatives and other CNS depressants that may worsen sleep apnea and disrupt normal sleep architecture. - Sleep hygiene should be reviewed to assess factors that may improve sleep quality. - Weight management and regular exercise should be initiated or continued. - Return to Sleep Center for re-evaluation after 6 weeks of therapy  [Electronically signed] 11/07/2021 06:41 AM  Armanda Magic MD, ABSM Diplomate, American Board of Sleep Medicine

## 2021-11-21 ENCOUNTER — Other Ambulatory Visit: Payer: Self-pay | Admitting: Internal Medicine

## 2021-12-03 ENCOUNTER — Ambulatory Visit (INDEPENDENT_AMBULATORY_CARE_PROVIDER_SITE_OTHER): Payer: Medicare Other | Admitting: Family Medicine

## 2021-12-03 ENCOUNTER — Other Ambulatory Visit: Payer: Self-pay

## 2021-12-03 ENCOUNTER — Telehealth: Payer: Self-pay

## 2021-12-03 DIAGNOSIS — R0789 Other chest pain: Secondary | ICD-10-CM

## 2021-12-03 DIAGNOSIS — Z87438 Personal history of other diseases of male genital organs: Secondary | ICD-10-CM | POA: Diagnosis not present

## 2021-12-03 MED ORDER — ACETAMINOPHEN ER 650 MG PO TBCR: 650.0000 mg | EXTENDED_RELEASE_TABLET | Freq: Three times a day (TID) | ORAL | 11 refills | Status: DC | PRN

## 2021-12-03 MED ORDER — DICLOFENAC SODIUM 1 % EX GEL: 4.0000 g | Freq: Four times a day (QID) | CUTANEOUS | 1 refills | Status: DC

## 2021-12-03 MED ORDER — LIDOCAINE 5 % EX PTCH: 1.0000 | MEDICATED_PATCH | CUTANEOUS | 0 refills | Status: DC

## 2021-12-03 NOTE — Patient Instructions (Signed)
Thank you for coming to see me today. It was a pleasure.   Voltaren gel 4 times a day Tylenol every 8 hours Lidocaine patch daily as needed Heat/Ice Deep Breathing exercises  Rib pain likely takes 4 to 6 weeks to heal.  If you have any shortness of breath, fevers or difficulty breathing please go to the emergency department immediately.   Please follow-up with PCP if pain has not improved in 2 weeks.  If you have any questions or concerns, please do not hesitate to call the office at [phone].  Best,   Dana Allan, MD

## 2021-12-03 NOTE — Telephone Encounter (Signed)
PA denied. Per insurance, coverage requirements were not met. They will send fax with this determination.   Please advise alternative medication or patient can pick up lidocaine patches OTC.   Talbot Grumbling, RN

## 2021-12-03 NOTE — Telephone Encounter (Signed)
Received fax from pharmacy, PA needed on Lidocaine 5% patches.  Clinical questions submitted via Cover My Meds.  Waiting on response, could take up to 72 hours.  Cover My Meds info: Key: [id number]  Talbot Grumbling, RN

## 2021-12-03 NOTE — Progress Notes (Signed)
° ° °  SUBJECTIVE:   CHIEF COMPLAINT / HPI: rib pain  Fell 1 week ago while getting out of bed.  Hit right side of chest on nearby tables.  Had some bruising and swelling that has since resolved.  Using OTC pain patches, Tylenol, Heat that have helped a little.  Had similar episode few months ago causing injury to left chest.  Denies any shortness of breath, pain on inspiration or fevers.    PERTINENT  PMH / PSH:  Autonomic hypotension Falls Tobacco use SDH  OBJECTIVE:   BP 104/62    Pulse 96    Wt 171 lb 9.6 oz (77.8 kg)    SpO2 98%    BMI 23.27 kg/m    General: Alert, no acute distress Cardio: Normal S1 and S2, RRR, no r/m/g Pulm: CTAB, normal work of breathing.  No tracheal deviation Right anterior chest:  No ecchymosis, erythema or edema.  No flail chest.  Pain elicited along anterior chest  just below nipple line                                                ASSESSMENT/PLAN:   Anterior chest wall pain Pain reproducible and located on right side along 5th rib area.  No concern for pneumonia, pneumothorax given no fevers and good air entry bilaterally.  Possible healing rib fracture or cartilage inflammation. No imaging necessary at this time as will not change management -Voltaren gel QID -Lidocaine Patch q24h -Tylenol 650 mg q8h as needed -Would avoid Opiates given history of falls and hypotension on Midodrine -Heat/Ice as needed -No need for abdominal binder, may splint with pillow if pain during movement of coughing -Deep breathing exercises frequently -Strict return precautions provided -Follow up with PCP if no improvement in symptoms  History of BPH Recommended to follow up with PCP to discuss.  Currently on Flomax but given hypotension would recommend switching to Finasteride if appropriate. Patient agreeable to follow up with PCP to discuss     Dana Allan, MD [facility]

## 2021-12-06 ENCOUNTER — Encounter: Payer: Self-pay | Admitting: Family Medicine

## 2021-12-06 DIAGNOSIS — R0789 Other chest pain: Secondary | ICD-10-CM | POA: Insufficient documentation

## 2021-12-06 NOTE — Assessment & Plan Note (Addendum)
Pain reproducible and located on right side along 5th rib area.  No concern for pneumonia, pneumothorax given no fevers and good air entry bilaterally.  Possible healing rib fracture or cartilage inflammation. No imaging necessary at this time as will not change management -Voltaren gel QID -Lidocaine Patch q24h -Tylenol 650 mg q8h as needed -Would avoid Opiates given history of falls and hypotension on Midodrine -Heat/Ice as needed -No need for abdominal binder, may splint with pillow if pain during movement of coughing -Deep breathing exercises frequently -Strict return precautions provided -Follow up with PCP if no improvement in symptoms

## 2021-12-06 NOTE — Assessment & Plan Note (Signed)
Recommended to follow up with PCP to discuss.  Currently on Flomax but given hypotension would recommend switching to Finasteride if appropriate. Patient agreeable to follow up with PCP to discuss

## 2021-12-08 ENCOUNTER — Telehealth: Payer: Self-pay | Admitting: *Deleted

## 2021-12-08 DIAGNOSIS — G4733 Obstructive sleep apnea (adult) (pediatric): Secondary | ICD-10-CM

## 2021-12-08 NOTE — Telephone Encounter (Signed)
-----   Message from Sueanne Margarita, MD sent at 11/07/2021  6:44 AM EST ----- Please let patient know that they had a successful PAP titration and let DME know that orders are in EPIC.  Please set up 6 week OV with me.

## 2021-12-08 NOTE — Telephone Encounter (Signed)
The patient has been notified of the result and verbalized understanding.  All questions (if any) were answered. Jose Li, CMA 12/08/2021 4:03 PM    Upon patient request DME selection is [facility] Patient understands he will be contacted by [facility] to set up his cpap. Patient understands to call if [facility] does not contact him with new setup in a timely manner. Patient understands they will be called once confirmation has been received from [facility]/ that they have received their new machine to schedule 10 week follow up appointment.   [facility] notified of new cpap order  Please add to airview Patient was grateful for the call and thanked me

## 2021-12-20 ENCOUNTER — Other Ambulatory Visit: Payer: Self-pay | Admitting: Internal Medicine

## 2021-12-23 ENCOUNTER — Telehealth: Payer: Self-pay

## 2021-12-23 ENCOUNTER — Other Ambulatory Visit (HOSPITAL_COMMUNITY): Payer: Self-pay

## 2021-12-23 NOTE — Telephone Encounter (Signed)
Received 2nd prior auth request for pt's Lidocaine 5% pataches.   Call to inform pt that insurance does not cover these and they can be purchased over the counter (as stated in telephone note from 12/03/21). Pt expressed understanding.   Cancelled PA request in covermymeds.

## 2021-12-29 ENCOUNTER — Encounter: Payer: Self-pay | Admitting: Neurology

## 2021-12-29 ENCOUNTER — Ambulatory Visit (INDEPENDENT_AMBULATORY_CARE_PROVIDER_SITE_OTHER): Payer: Medicare Other | Admitting: Neurology

## 2021-12-29 VITALS — BP 126/73 | HR 107 | Ht 72.0 in | Wt 166.0 lb

## 2021-12-29 DIAGNOSIS — G40909 Epilepsy, unspecified, not intractable, without status epilepticus: Secondary | ICD-10-CM

## 2021-12-29 MED ORDER — LEVETIRACETAM 750 MG PO TABS: 1500.0000 mg | ORAL_TABLET | Freq: Two times a day (BID) | ORAL | 4 refills | Status: DC

## 2021-12-29 MED ORDER — PREGABALIN 75 MG PO CAPS: 75.0000 mg | ORAL_CAPSULE | Freq: Two times a day (BID) | ORAL | 1 refills | Status: DC

## 2021-12-29 NOTE — Patient Instructions (Signed)
Continue current medications Call for any seizures Stay hydrated, stand slowly  See you back in 1 year

## 2021-12-29 NOTE — Progress Notes (Signed)
PATIENT: Jose Li DOB: 1956/07/31  REASON FOR VISIT: follow up for seizures HISTORY FROM: patient PRIMARY NEUROLOGIST: Dr. April Manson  HISTORY OF PRESENT ILLNESS: Today 12/29/21 Jose Li is here today for follow-up for seizures. His father passed away last week at age of 13. Jose Li is going to move to an apartment. He is under a lot of stress. Remains on Lyrica and Keppra. At times he might feel a seizure coming on, he will lay down. In the past has had grand mal seizures, can't remember the last time. Doesn't have a drivers license. No major health issues. Had a passing out spell back in November, December, with rib injury. On midodrine from cardiology.   Update 12/29/2020 SS: Jose Li is a 66 year old male with history of alcohol abuse and seizures.  Also history of orthostatic hypotension and OSA on BiPAP.  He remains on Keppra and Lyrica. No recurrent seizure.  He does not drive, he lives with his father who is 49.  He is followed by cardiology for orthostatic hypotension, denies any recent syncopal spells.  He has no complaints today.  His father drove him today, he is on disability, is not very active during the day, mostly stays home, takes care of cats.  Reports only occasional alcohol use.  Here today for evaluation unaccompanied.  HISTORY  11/13/2019 SS: Jose Li is a 66 year old male with history of alcohol abuse and history of seizures.  He also has history of orthostatic hypotension and OSA treated with BiPAP.  He remains on Keppra 750 mg tablets, 2 tablets twice a day.  He is also taking Lyrica 75 mg twice daily.  He reports he has not had recurrent seizure.  He says he has had 2 episodes of dizziness, likely related to his autonomic dysfunction, but no blackouts.  He says he is no longer drinking alcohol.  He does not drive a car.  He lives with his father who is 86 years old.  He says he is able to manage his medications.  He indicates his overall health has been well.  He is on  disability, he does not do much during the day.  He presents today for evaluation unaccompanied.  REVIEW OF SYSTEMS: Out of a complete 14 system review of symptoms, the patient complains only of the following symptoms, and all other reviewed systems are negative.  See HPI  ALLERGIES: Allergies  Allergen Reactions   Duloxetine Other (See Comments)    Caused hyponatremia   Erythromycin Other (See Comments)    Unknown reaction. The patient can not remember   Hctz [Hydrochlorothiazide]     "Makes him faint" per patient Dr. Harrington Challenger ([place]) took him off   Propranolol     "Makes him faint" per patient Dr. Harrington Challenger ([place]) took him off      HOME MEDICATIONS: Outpatient Medications Prior to Visit  Medication Sig Dispense Refill   acetaminophen (TYLENOL 8 HOUR) 650 MG CR tablet Take 1 tablet (650 mg total) by mouth every 8 (eight) hours as needed for pain. 90 tablet 11   aspirin 81 MG EC tablet Take 81 mg by mouth daily.     atorvastatin (LIPITOR) 20 MG tablet Take 1 tablet by mouth once daily 90 tablet 2   benztropine (COGENTIN) 1 MG tablet Take 1 mg by mouth daily.      busPIRone (BUSPAR) 5 MG tablet Take 5 mg by mouth 2 (two) times daily.      cetirizine (ZYRTEC) 10 MG tablet Take 1  tablet by mouth once daily 90 tablet 0   citalopram (CELEXA) 20 MG tablet Take 20 mg by mouth daily.     diclofenac Sodium (VOLTAREN) 1 % GEL Apply 4 g topically 4 (four) times daily. 4 g 1   midodrine (PROAMATINE) 5 MG tablet TAKE 2 TABLETS BY MOUTH ONCE DAILY AT  6  AM,  THEN  1  TABLET  AT  10  AM  AND  1  TABLET  AT  2  PM 360 tablet 2   MODERNA COVID-19 VACCINE 100 MCG/0.5ML injection      Multiple Vitamin (MULTIVITAMIN) tablet Take 1 tablet by mouth daily.     potassium chloride SA (KLOR-CON) 20 MEQ tablet Take 20 mEq by mouth daily.     risperiDONE (RISPERDAL) 2 MG tablet Take 2 mg by mouth at bedtime.      tamsulosin (FLOMAX) 0.4 MG CAPS capsule Take 0.4 mg by mouth daily.     thiamine 100 MG tablet  Take 100 mg by mouth daily.     traZODone (DESYREL) 150 MG tablet Take 150 mg by mouth at bedtime.      levETIRAcetam (KEPPRA) 750 MG tablet Take 2 tablets (1,500 mg total) by mouth 2 (two) times daily. 360 tablet 4   pregabalin (LYRICA) 75 MG capsule Take 1 capsule by mouth twice daily 180 capsule 1   lidocaine (LIDODERM) 5 % Place 1 patch onto the skin daily. Remove & Discard patch within 12 hours or as directed by MD (Patient not taking: Reported on 12/29/2021) 30 patch 0   No facility-administered medications prior to visit.    PAST MEDICAL HISTORY: Past Medical History:  Diagnosis Date   Acute kidney injury ([place]) 05/26/2018   Allergy    Anemia    Anxiety    Depression    Hyperlipidemia    Hypertension, essential, benign 08/02/2012   Orthostatic hypotension 10/09/2013   OSA (obstructive sleep apnea) 10/01/2015   Severe OSA with an AHI of 86/hr now on BiPAP at 23/19cm H2O   Seizures ([place])    none for last 6-29months   Substance abuse ([place])     PAST SURGICAL HISTORY: Past Surgical History:  Procedure Laterality Date   FRACTURE SURGERY  1990   right hip and femur   HERNIA REPAIR  [id number]   umbilical hernia/ got infected   JOINT REPLACEMENT Right 1990   right hip   TRACHEOSTOMY  08/1999   Had pneumoniaand was in a coma 10 days    FAMILY HISTORY: Family History  Problem Relation Age of Onset   Heart disease Mother    Diabetes Father    Heart disease Brother    Hypertension Neg Hx    Stroke Neg Hx     SOCIAL HISTORY: Social History   Socioeconomic History   Marital status: Single    Spouse name: Not on file   Number of children: Not on file   Years of education: Not on file   Highest education level: Not on file  Occupational History   Occupation: diability  Tobacco Use   Smoking status: Every Day    Packs/day: 0.50    Years: 30.00    Pack years: 15.00    Types: Cigarettes   Smokeless tobacco: Never   Tobacco comments:    Had cut back, but stress has  prompted him to smoke again  Vaping Use   Vaping Use: Never used  Substance and Sexual Activity   Alcohol use: No  Comment: history of alcohol abuse.   Drug use: No   Sexual activity: Not Currently  Other Topics Concern   Not on file  Social History Narrative   Lives with 12 year old Father, Araceli Bouche. Watches TV for fun. Likes Sports coach and order.      His only son lives with his mother in [place], and attends [employer].    Social Determinants of Health   Financial Resource Strain: Not on file  Food Insecurity: Not on file  Transportation Needs: Not on file  Physical Activity: Not on file  Stress: Not on file  Social Connections: Not on file  Intimate Partner Violence: Not on file   PHYSICAL EXAM  Vitals:   12/29/21 1031  BP: 126/73  Pulse: (!) 107  Weight: 166 lb (75.3 kg)  Height: 6' (1.829 m)    Body mass index is 22.51 kg/m.  Generalized: Well developed, in no acute distress   Neurological examination  Mentation: Alert oriented to time, place, history taking. Follows all commands speech and language fluent Cranial nerve II-XII: Pupils were equal round reactive to light. Extraocular movements were full, visual field were full on confrontational test. Facial sensation and strength were normal. Head turning and shoulder shrug  were normal and symmetric. Motor: The motor testing reveals 5 over 5 strength of all 4 extremities. Good symmetric motor tone is noted throughout.  Sensory: Sensory testing is intact to soft touch on all 4 extremities. No evidence of extinction is noted.  Coordination: Cerebellar testing reveals good finger-nose-finger and heel-to-shin bilaterally.  Gait and station: Gait is normal. Tandem gait is unsteady Reflexes: Deep tendon reflexes are symmetric and normal bilaterally.   DIAGNOSTIC DATA (LABS, IMAGING, TESTING) - I reviewed patient records, labs, notes, testing and imaging myself where available.  Lab Results  Component Value Date    WBC 9.3 09/14/2021   HGB 13.1 09/14/2021   HCT 38.5 09/14/2021   MCV 89 09/14/2021   PLT 283 09/14/2021      Component Value Date/Time   NA 141 09/14/2021 1102   K 4.3 09/14/2021 1102   CL 107 (H) 09/14/2021 1102   CO2 23 09/14/2021 1102   GLUCOSE 92 09/14/2021 1102   GLUCOSE 89 08/10/2017 1533   BUN 6 (L) 09/14/2021 1102   CREATININE 1.27 09/14/2021 1102   CREATININE 0.94 06/10/2016 1613   CALCIUM 9.2 09/14/2021 1102   PROT 6.6 01/23/2018 1022   ALBUMIN 4.3 01/23/2018 1022   AST 21 01/23/2018 1022   ALT 31 01/23/2018 1022   ALKPHOS 55 01/23/2018 1022   BILITOT 0.3 01/23/2018 1022   GFRNONAA 56 (L) 10/17/2020 1013   GFRNONAA 84 02/17/2016 1011   GFRAA 65 10/17/2020 1013   GFRAA >89 02/17/2016 1011   Lab Results  Component Value Date   CHOL 114 09/14/2021   HDL 36 (L) 09/14/2021   LDLCALC 53 09/14/2021   TRIG 147 09/14/2021   CHOLHDL 3.2 09/14/2021   Lab Results  Component Value Date   HGBA1C 5.6 12/29/2015   Lab Results  Component Value Date   VITAMINB12 473 02/06/2016   Lab Results  Component Value Date   TSH 1.518 02/06/2016   ASSESSMENT AND PLAN 66 y.o. year old male  has a past medical history of Acute kidney injury ([place]) (05/26/2018), Allergy, Anemia, Anxiety, Depression, Hyperlipidemia, Hypertension, essential, benign (08/02/2012), Orthostatic hypotension (10/09/2013), OSA (obstructive sleep apnea) (10/01/2015), Seizures ([place]), and Substance abuse ([place]). here with:  1.  History of seizures 2.  Orthostatic hypotension  -  Unfortunately, his 96 year old father passed away last week, will be relocating to an apartment, his father was his caregiver -No recent seizures reported, he will remain on Keppra and Lyrica, refills will be sent in -Encouraged him to ensure he stays hydrated, stand slowly due to history of orthostatic hypotension -Stay in touch with PCP, reach out if needed, will be an adjustment living on his own, he does not have a driver's  license -Follow-up here in 1 year or sooner if needed  Evangeline Dakin, DNP 12/29/2021, 11:04 AM [facility] [address] [place], [place] [address] [phone]

## 2022-01-06 ENCOUNTER — Other Ambulatory Visit (HOSPITAL_COMMUNITY): Payer: Self-pay

## 2022-01-20 ENCOUNTER — Ambulatory Visit (INDEPENDENT_AMBULATORY_CARE_PROVIDER_SITE_OTHER): Payer: Medicare Other

## 2022-01-20 VITALS — Ht 71.0 in | Wt 165.0 lb

## 2022-01-20 DIAGNOSIS — Z0000 Encounter for general adult medical examination without abnormal findings: Secondary | ICD-10-CM | POA: Diagnosis not present

## 2022-01-20 NOTE — Progress Notes (Addendum)
Subjective:   Jose Li is a 66 y.o. male who presents for Medicare Annual/Subsequent preventive examination.  The patient consented to a virtual visit. Patient consented to have virtual visit and was identified by name and date of birth. Method of visit: Telephone  Encounter participants: Patient: Jose Li - located at [facility] Nurse/Provider: Steva Colder - located at Home Others (if applicable): NA  Review of Systems: Defer to PCP  Cardiac Risk Factors include: smoking/ tobacco exposure  Objective:    Vitals: Ht 5\' 11"  (1.803 m)   Wt 165 lb (74.8 kg)   BMI 23.01 kg/m   Body mass index is 23.01 kg/m.  Advanced Directives 01/20/2022 12/03/2021 10/26/2021 04/17/2019 11/27/2018 07/27/2018 01/24/2017  Does Patient Have a Medical Advance Directive? No No No No No No No  Would patient like information on creating a medical advance directive? Yes (MAU/Ambulatory/Procedural Areas - Information given) No - Patient declined No - Patient declined Yes (MAU/Ambulatory/Procedural Areas - Information given) No - Patient declined No - Patient declined No - Patient declined   Tobacco Social History   Tobacco Use  Smoking Status Every Day   Packs/day: 1.00   Years: 30.00   Pack years: 30.00   Types: Cigarettes   Passive exposure: Current  Smokeless Tobacco Never     Ready to quit: No  Counseling given: Yes  Clinical Intake:  Pre-visit preparation completed: Yes  How often do you need to have someone help you when you read instructions, pamphlets, or other written materials from your doctor or pharmacy?: 2 - Rarely 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  Interpreter Needed?: No  Past Medical History:  Diagnosis Date   Acute kidney injury ([facility]) 05/26/2018   Allergy    Anemia    Anxiety    Depression    Hyperlipidemia    Hypertension, essential, benign 08/02/2012   Orthostatic hypotension 10/09/2013   OSA (obstructive sleep apnea) 10/01/2015    Severe OSA with an AHI of 86/hr now on BiPAP at 23/19cm H2O   Seizures ([facility])    none for last 6-91months   Substance abuse ([facility])    Past Surgical History:  Procedure Laterality Date   FRACTURE SURGERY  1990   right hip and femur   HERNIA REPAIR  07/2009   umbilical hernia/ got infected   JOINT REPLACEMENT Right 1990   right hip   TRACHEOSTOMY  08/1999   Had pneumoniaand was in a coma 10 days   Family History  Problem Relation Age of Onset   Heart disease Mother    Diabetes Father    Heart disease Brother    Hypertension Neg Hx    Stroke Neg Hx    Social History   Socioeconomic History   Marital status: Divorced    Spouse name: Not on file   Number of children: 1   Years of education: 14   Highest education level: Tax adviser degree: occupational, Scientist, product/process development, or vocational program  Occupational History   Occupation: diability  Tobacco Use   Smoking status: Every Day    Packs/day: 1.00    Years: 30.00    Pack years: 30.00    Types: Cigarettes    Passive exposure: Current   Smokeless tobacco: Never  Vaping Use   Vaping Use: Never used  Substance and Sexual Activity   Alcohol use: No    Comment: history of alcohol abuse.   Drug use: Yes    Types: Cocaine  Comment: Hx of use "years ago"   Sexual activity: Not Currently  Other Topics Concern   Not on file  Social History Narrative   Patient lives alone. His father recently passed away.    A lot of stressors are coming from his passing.    Patient can not drive. Utilizes friends, taxis or medicare transportation.    Enjoys watching tv and working in the yard.    Social Determinants of Health   Financial Resource Strain: Low Risk    Difficulty of Paying Living Expenses: Not hard at all  Food Insecurity: No Food Insecurity   Worried About Programme researcher, broadcasting/film/video in the Last Year: Never true   Ran Out of Food in the Last Year: Never true  Transportation Needs: No Transportation Needs   Lack of Transportation  (Medical): No   Lack of Transportation (Non-Medical): No  Physical Activity: Sufficiently Active   Days of Exercise per Week: 5 days   Minutes of Exercise per Session: 30 min  Stress: Stress Concern Present   Feeling of Stress : Very much  Social Connections: Socially Isolated   Frequency of Communication with Friends and Family: More than three times a week   Frequency of Social Gatherings with Friends and Family: More than three times a week   Attends Religious Services: Never   Database administrator or Organizations: No   Attends Banker Meetings: Never   Marital Status: Divorced   Outpatient Encounter Medications as of 01/20/2022  Medication Sig   acetaminophen (TYLENOL 8 HOUR) 650 MG CR tablet Take 1 tablet (650 mg total) by mouth every 8 (eight) hours as needed for pain.   aspirin 81 MG EC tablet Take 81 mg by mouth daily.   atorvastatin (LIPITOR) 20 MG tablet Take 1 tablet by mouth once daily   benztropine (COGENTIN) 1 MG tablet Take 1 mg by mouth daily.    busPIRone (BUSPAR) 5 MG tablet Take 5 mg by mouth 2 (two) times daily.    cetirizine (ZYRTEC) 10 MG tablet Take 1 tablet by mouth once daily   citalopram (CELEXA) 20 MG tablet Take 20 mg by mouth daily.   diclofenac Sodium (VOLTAREN) 1 % GEL Apply 4 g topically 4 (four) times daily.   levETIRAcetam (KEPPRA) 750 MG tablet Take 2 tablets (1,500 mg total) by mouth 2 (two) times daily.   midodrine (PROAMATINE) 5 MG tablet TAKE 2 TABLETS BY MOUTH ONCE DAILY AT  6  AM,  THEN  1  TABLET  AT  10  AM  AND  1  TABLET  AT  2  PM   Multiple Vitamin (MULTIVITAMIN) tablet Take 1 tablet by mouth daily.   potassium chloride SA (KLOR-CON) 20 MEQ tablet Take 20 mEq by mouth daily.   pregabalin (LYRICA) 75 MG capsule Take 1 capsule (75 mg total) by mouth 2 (two) times daily.   risperiDONE (RISPERDAL) 2 MG tablet Take 2 mg by mouth at bedtime.    tamsulosin (FLOMAX) 0.4 MG CAPS capsule Take 0.4 mg by mouth daily.   thiamine 100 MG  tablet Take 100 mg by mouth daily.   traZODone (DESYREL) 150 MG tablet Take 150 mg by mouth at bedtime.    lidocaine (LIDODERM) 5 % Place 1 patch onto the skin daily. Remove & Discard patch within 12 hours or as directed by MD (Patient not taking: Reported on 12/29/2021)   MODERNA COVID-19 VACCINE 100 MCG/0.5ML injection  (Patient not taking: Reported on 01/20/2022)  No facility-administered encounter medications on file as of 01/20/2022.   Activities of Daily Living In your present state of health, do you have any difficulty performing the following activities: 01/20/2022  Hearing? N  Vision? N  Difficulty concentrating or making decisions? N  Walking or climbing stairs? N  Dressing or bathing? N  Doing errands, shopping? N  Preparing Food and eating ? N  Using the Toilet? N  In the past six months, have you accidently leaked urine? N  Do you have problems with loss of bowel control? N  Managing your Medications? N  Managing your Finances? N  Housekeeping or managing your Housekeeping? N  Some recent data might be hidden   Patient Care Team: Lavonda Jumbo, DO as PCP - General Pricilla Riffle, MD as PCP - Cardiology (Cardiology) Glean Salvo, NP as Nurse Practitioner (Neurology) Coralyn Pear, MD as Referring Physician (Urology)   Assessment:   This is a routine wellness examination for Moishe.  Exercise Activities and Dietary recommendations Current Exercise Habits: Home exercise routine, Time (Minutes): 30, Frequency (Times/Week): 5, Intensity: Mild, Exercise limited by: respiratory conditions(s)   Goals       PHQ-9 < 5 (pt-stated)      Mazen would like to get his depression under control. He is on medication and sees counselors at [facility]. He would like to get his PHQ-9 under 5.      Quit Smoking      Back to smoking 1PPD from 0.5PPD due to his fathers passing.        Fall Risk Fall Risk  01/20/2022 07/13/2019 04/17/2019 11/27/2018 07/07/2016  Falls in the past year? 1 0 1 0  Yes  Number falls in past yr: 1 0 0 - 2 or more  Injury with Fall? 1 - 1 - Yes  Comment - - - - -  Risk Factor Category  - - - - High Fall Risk  Risk for fall due to : History of fall(s);Medication side effect;Mental status change - History of fall(s);Mental status change;Impaired vision - History of fall(s);Medication side effect;Mental status change  Follow up Falls prevention discussed Falls evaluation completed Falls prevention discussed - Falls prevention discussed;Follow up appointment  Comment - - - - Seeing neurologist about falls. ER thought it was caused by Cymbalta, but dizziness persists.   Patient reports hx of falls due to neurologic conditions. Patient denies using assistive devices to ambulate at this time.   Is the patient's home free of loose throw rugs in walkways, pet beds, electrical cords, etc?   yes      Grab bars in the bathroom? yes      Handrails on the stairs?   yes      Adequate lighting?   yes  Depression Screen PHQ 2/9 Scores 01/20/2022 12/03/2021 10/12/2021 09/09/2020  PHQ - 2 Score 2 2 2 2   PHQ- 9 Score 9 9 5 5    Cognitive Function 6CIT Screen 01/20/2022 04/17/2019  What Year? 0 points 0 points  What month? 0 points 0 points  What time? 0 points 0 points  Count back from 20 0 points 0 points  Months in reverse 0 points 0 points  Repeat phrase 0 points 0 points  Total Score 0 0   Immunization History  Administered Date(s) Administered   Influenza Split 08/18/2011, 08/02/2012   Influenza Whole 09/06/2007, 08/21/2008, 08/22/2008   Influenza,inj,Quad PF,6+ Mos 09/21/2013, 09/13/2014, 07/07/2016, 01/23/2018, 07/27/2018, 07/13/2019, 09/09/2020   Janssen (J&J) SARS-COV-2 Vaccination 02/25/2020  Moderna SARS-COV2 Booster Vaccination 10/16/2020, 04/13/2021   Pneumococcal Polysaccharide-23 12/29/2015   Td 11/15/2005   Tdap 01/28/2016   Qualifies for Shingles Vaccine? Yes   Shingrix Completed: No, Education has been provided regarding the importance of this  vaccine. Advised may receive this vaccine at local pharmacy or Health Dept. Aware to provide a copy of the vaccination record if obtained from local pharmacy or Health Dept. Verbalized acceptance and understanding.   Screening Tests Health Maintenance  Topic Date Due   Zoster Vaccines- Shingrix (1 of 2) Never done   COLONOSCOPY (Pts 45-10yrs Insurance coverage will need to be confirmed)  05/03/2021   COVID-19 Vaccine (2 - Booster for Janssen series) 06/08/2021   TETANUS/TDAP  01/27/2026   INFLUENZA VACCINE  Completed   Hepatitis C Screening  Completed   HIV Screening  Completed   HPV VACCINES  Aged Out   Cancer Screenings: Lung: Low Dose CT Chest recommended if Age 57-80 years, 30 pack-year currently smoking OR have quit w/in 15years. Patient does not qualify. Colorectal: Due-04/2011  Additional Screenings: Hepatitis C Screening: Completed  HIV Screening: Completed   Plan:  Schedule PCP visit in the next month. Colonoscopy due- FU with PCP for referral.  Covid bivalent booster due- you can get this at PCP visit.  Shingles due- you can get this at your local pharmacy.   I have personally reviewed and noted the following in the patients chart:   Medical and social history Use of alcohol, tobacco or illicit drugs  Current medications and supplements Functional ability and status Nutritional status Physical activity Advanced directives List of other physicians Hospitalizations, surgeries, and ER visits in previous 12 months Vitals Screenings to include cognitive, depression, and falls Referrals and appointments  In addition, I have reviewed and discussed with patient certain preventive protocols, quality metrics, and best practice recommendations. A written personalized care plan for preventive services as well as general preventive health recommendations were provided to patient.  This visit was conducted virtually in the setting of the COVID19 pandemic.    Steva Colder,  CMA  01/20/2022  I have reviewed this visit and agree with the documentation.

## 2022-01-20 NOTE — Patient Instructions (Addendum)
Thank you for taking time to come for your Medicare Wellness Visit. I appreciate your ongoing commitment to your health goals. Please review the following plan we discussed and let me know if I can assist you in the future.    These are the goals we discussed:   Goals       PHQ-9 < 5 (pt-stated)      Jose Li would like to get his depression under control. He is on medication and sees counselors at [facility]. He would like to get his PHQ-9 under 5.      Quit Smoking      Back to smoking 1PPD from 0.5PPD due to his fathers passing.        We also discussed recommended health maintenance. Please call our office and schedule a visit. As discussed, you are due for: Health Maintenance  Topic Date Due   Zoster Vaccines- Shingrix (1 of 2) Never done   COLONOSCOPY (Pts 45-59yrs Insurance coverage will need to be confirmed)  05/03/2021   COVID-19 Vaccine (2 - Booster for Janssen series) 06/08/2021   TETANUS/TDAP  01/27/2026   INFLUENZA VACCINE  Completed   Hepatitis C Screening  Completed   HIV Screening  Completed   HPV VACCINES  Aged Out   Schedule PCP visit in the next month. Colonoscopy due- FU with PCP for referral.  Covid bivalent booster due- you can get this at PCP visit.  Shingles due- you can get this at your local pharmacy.   We also discussed smoking cessation.  1-800-QUIT-NOW  Preventive Care 39-17 Years Old, Male Preventive care refers to lifestyle choices and visits with your health care provider that can promote health and wellness. Preventive care visits are also called wellness exams. What can I expect for my preventive care visit? Counseling During your preventive care visit, your health care provider may ask about your: Medical history, including: Past medical problems. Family medical history. Current health, including: Emotional well-being. Home life and relationship well-being. Sexual activity. Lifestyle, including: Alcohol, nicotine or tobacco, and drug  use. Access to firearms. Diet, exercise, and sleep habits. Safety issues such as seatbelt and bike helmet use. Sunscreen use. Work and work Astronomer. Physical exam Your health care provider will check your: Height and weight. These may be used to calculate your BMI (body mass index). BMI is a measurement that tells if you are at a healthy weight. Waist circumference. This measures the distance around your waistline. This measurement also tells if you are at a healthy weight and may help predict your risk of certain diseases, such as type 2 diabetes and high blood pressure. Heart rate and blood pressure. Body temperature. Skin for abnormal spots. What immunizations do I need? Vaccines are usually given at various ages, according to a schedule. Your health care provider will recommend vaccines for you based on your age, medical history, and lifestyle or other factors, such as travel or where you work. What tests do I need? Screening Your health care provider may recommend screening tests for certain conditions. This may include: Lipid and cholesterol levels. Diabetes screening. This is done by checking your blood sugar (glucose) after you have not eaten for a while (fasting). Hepatitis B test. Hepatitis C test. HIV (human immunodeficiency virus) test. STI (sexually transmitted infection) testing, if you are at risk. Lung cancer screening. Prostate cancer screening. Colorectal cancer screening. Talk with your health care provider about your test results, treatment options, and if necessary, the need for more tests. Follow these instructions  at home: Eating and drinking  Eat a diet that includes fresh fruits and vegetables, whole grains, lean protein, and low-fat dairy products. Take vitamin and mineral supplements as recommended by your health care provider. Do not drink alcohol if your health care provider tells you not to drink. If you drink alcohol: Limit how much you have to  0-2 drinks a day. Know how much alcohol is in your drink. In the [place] one drink equals one 12 oz bottle of beer (355 mL), one 5 oz glass of wine (148 mL), or one 1 oz glass of hard liquor (44 mL). Lifestyle Brush your teeth every morning and night with fluoride toothpaste. Floss one time each day. Exercise for at least 30 minutes 5 or more days each week. Do not use any products that contain nicotine or tobacco. These products include cigarettes, chewing tobacco, and vaping devices, such as e-cigarettes. If you need help quitting, ask your health care provider. Do not use drugs. If you are sexually active, practice safe sex. Use a condom or other form of protection to prevent STIs. Take aspirin only as told by your health care provider. Make sure that you understand how much to take and what form to take. Work with your health care provider to find out whether it is safe and beneficial for you to take aspirin daily. Find healthy ways to manage stress, such as: Meditation, yoga, or listening to music. Journaling. Talking to a trusted person. Spending time with friends and family. Minimize exposure to UV radiation to reduce your risk of skin cancer. Safety Always wear your seat belt while driving or riding in a vehicle. Do not drive: If you have been drinking alcohol. Do not ride with someone who has been drinking. When you are tired or distracted. While texting. If you have been using any mind-altering substances or drugs. Wear a helmet and other protective equipment during sports activities. If you have firearms in your house, make sure you follow all gun safety procedures. What's next? Go to your health care provider once a year for an annual wellness visit. Ask your health care provider how often you should have your eyes and teeth checked. Stay up to date on all vaccines. This information is not intended to replace advice given to you by your health care provider. Make sure you  discuss any questions you have with your health care provider. Document Revised: 04/29/2021 Document Reviewed: 04/29/2021 Elsevier Patient Education  2022 [employer].  Fall Prevention in the Home, Adult Falls can cause injuries and can happen to people of all ages. There are many things you can do to make your home safe and to help prevent falls. Ask for help when making these changes. What actions can I take to prevent falls? General Instructions Use good lighting in all rooms. Replace any light bulbs that burn out. Turn on the lights in dark areas. Use night-lights. Keep items that you use often in easy-to-reach places. Lower the shelves around your home if needed. Set up your furniture so you have a clear path. Avoid moving your furniture around. Do not have throw rugs or other things on the floor that can make you trip. Avoid walking on wet floors. If any of your floors are uneven, fix them. Add color or contrast paint or tape to clearly mark and help you see: Grab bars or handrails. First and last steps of staircases. Where the edge of each step is. If you use a stepladder: Make sure  that it is fully opened. Do not climb a closed stepladder. Make sure the sides of the stepladder are locked in place. Ask someone to hold the stepladder while you use it. Know where your pets are when moving through your home. What can I do in the bathroom?   Keep the floor dry. Clean up any water on the floor right away. Remove soap buildup in the tub or shower. Use nonskid mats or decals on the floor of the tub or shower. Attach bath mats securely with double-sided, nonslip rug tape. If you need to sit down in the shower, use a plastic, nonslip stool. Install grab bars by the toilet and in the tub and shower. Do not use towel bars as grab bars. What can I do in the bedroom? Make sure that you have a light by your bed that is easy to reach. Do not use any sheets or blankets for your bed that  hang to the floor. Have a firm chair with side arms that you can use for support when you get dressed. What can I do in the kitchen? Clean up any spills right away. If you need to reach something above you, use a step stool with a grab bar. Keep electrical cords out of the way. Do not use floor polish or wax that makes floors slippery. What can I do with my stairs? Do not leave any items on the stairs. Make sure that you have a light switch at the top and the bottom of the stairs. Make sure that there are handrails on both sides of the stairs. Fix handrails that are broken or loose. Install nonslip stair treads on all your stairs. Avoid having throw rugs at the top or bottom of the stairs. Choose a carpet that does not hide the edge of the steps on the stairs. Check carpeting to make sure that it is firmly attached to the stairs. Fix carpet that is loose or worn. What can I do on the outside of my home? Use bright outdoor lighting. Fix the edges of walkways and driveways and fix any cracks. Remove anything that might make you trip as you walk through a door, such as a raised step or threshold. Trim any bushes or trees on paths to your home. Check to see if handrails are loose or broken and that both sides of all steps have handrails. Install guardrails along the edges of any raised decks and porches. Clear paths of anything that can make you trip, such as tools or rocks. Have leaves, snow, or ice cleared regularly. Use sand or salt on paths during winter. Clean up any spills in your garage right away. This includes grease or oil spills. What other actions can I take? Wear shoes that: Have a low heel. Do not wear high heels. Have rubber bottoms. Feel good on your feet and fit well. Are closed at the toe. Do not wear open-toe sandals. Use tools that help you move around if needed. These include: Canes. Walkers. Scooters. Crutches. Review your medicines with your doctor. Some  medicines can make you feel dizzy. This can increase your chance of falling. Ask your doctor what else you can do to help prevent falls. Where to find more information Centers for Disease Control and Prevention, STEAD[employer]: FootballExhibition.com.brwww.cdc.gov [employer] on Aging: https://walker.com/www.nia.nih.gov Contact a doctor if: You are afraid of falling at home. You feel weak, drowsy, or dizzy at home. You fall at home. Summary There are many simple things that you  can do to make your home safe and to help prevent falls. Ways to make your home safe include removing things that can make you trip and installing grab bars in the bathroom. Ask for help when making these changes in your home. This information is not intended to replace advice given to you by your health care provider. Make sure you discuss any questions you have with your health care provider. Document Revised: 06/04/2020 Document Reviewed: 06/04/2020 Elsevier Patient Education  2022 [employer].   Our clinic's number is [phone]. Please call with questions or concerns about what we discussed today.

## 2022-03-06 ENCOUNTER — Other Ambulatory Visit: Payer: Self-pay | Admitting: Family Medicine

## 2022-03-19 ENCOUNTER — Ambulatory Visit: Payer: Medicare Other | Admitting: Family Medicine

## 2022-03-29 ENCOUNTER — Other Ambulatory Visit: Payer: Self-pay

## 2022-03-29 NOTE — Progress Notes (Signed)
Verify Drug Registry For Pregabalin 75 Mg Capsule ?Last Filled: 03/26/2022 ?Quantity: 6 capsules for 3 days ?Last appointment: 12/29/2021 ?Next appointment: 12/30/2022 ?

## 2022-03-29 NOTE — Progress Notes (Signed)
I spoke with the pharmacist at [facility]. He stated pregabalin was backordered. He stated they should be able to get in a new shipment today. Unsure of why duplicate request was sent. He can fill the rest of rx when medication is available. ?

## 2022-03-29 NOTE — Progress Notes (Signed)
Can you check on this refill request, I sent 12/29/10 Lyrica script with 1 refill, he filled # 180, 12/29/21, should be refill on file. On 03/26/22, he got 6 capsules only. Why not the # 180 refill? ?

## 2022-04-07 ENCOUNTER — Ambulatory Visit (INDEPENDENT_AMBULATORY_CARE_PROVIDER_SITE_OTHER): Payer: Medicare Other | Admitting: Family Medicine

## 2022-04-07 ENCOUNTER — Ambulatory Visit (INDEPENDENT_AMBULATORY_CARE_PROVIDER_SITE_OTHER): Payer: Medicare Other

## 2022-04-07 ENCOUNTER — Encounter: Payer: Self-pay | Admitting: Family Medicine

## 2022-04-07 VITALS — BP 122/69 | HR 105 | Ht 72.0 in | Wt 168.4 lb

## 2022-04-07 DIAGNOSIS — M542 Cervicalgia: Secondary | ICD-10-CM

## 2022-04-07 DIAGNOSIS — Z1212 Encounter for screening for malignant neoplasm of rectum: Secondary | ICD-10-CM | POA: Diagnosis not present

## 2022-04-07 DIAGNOSIS — Z23 Encounter for immunization: Secondary | ICD-10-CM

## 2022-04-07 DIAGNOSIS — N1832 Chronic kidney disease, stage 3b: Secondary | ICD-10-CM

## 2022-04-07 DIAGNOSIS — Z1211 Encounter for screening for malignant neoplasm of colon: Secondary | ICD-10-CM

## 2022-04-07 NOTE — Patient Instructions (Addendum)
It was wonderful to see you today.  Please bring ALL of your medications with you to every visit.   Today we talked about:  Neck pain- Muscle strain, use ice, tylenol, and voltren gel. Massage will also help with pain relief. Follow up in 1 week if not improved  We checked you kidney function and electrolytes.   Call [phone] to schedule your colonocopy  Please call the clinic at [phone] if your symptoms worsen or you have any concerns. It was our pleasure to serve you.  Dr. Salvadore Dom

## 2022-04-07 NOTE — Progress Notes (Unsigned)
    SUBJECTIVE:   CHIEF COMPLAINT / HPI:   Neck pain X1 day. Was helping a friend move yesterday and felt it cramp up or lock up this morning. Initially could not move it but is now improving. Hurts mostly at the posterior right side of the neck. Attempted tylenol for it. Denies radiation of pain.   Check kidney function, hx of CKD 3b Denies urinary symptoms such as polyuria, dysuria, and urgency.   HM Needs shingle vaccine- plans to get a pharmacy Needs colonoscopy- previously seen at [place]  PMH / [place]: HTN, hx of BPH, TBI  OBJECTIVE:   BP 122/69   Pulse (!) 105   Ht 6' (1.829 m)   Wt 168 lb 6.4 oz (76.4 kg)   SpO2 98%   BMI 22.84 kg/m   General: Appears well, no acute distress. Age appropriate. Respiratory: normal effort MSK: Neck/Back: - Inspection: no gross deformity or asymmetry, swelling or ecchymosis - Palpation: Right muscularture feels more hypertonic than the left. TTP over right cervical muscles.  - ROM: Decreased ROM with right rotation and extension. - pain with rt. rotation - Neuro: sensation intact in the C5-C8 nerve root distribution b/l TTP.  Neuro: alert and oriented Psych: normal affect   ASSESSMENT/PLAN:   1. Stage 3b chronic kidney disease (HCC) Last Cr 1.2/GFR 60 with is the best it has been in years. Will repeat today.  - Basic Metabolic Panel  2. Neck pain Acute x1 day without radiculopathy and history of overuse. Likely muscle strain and self limiting. Discussed conservative treatment and follow up if fails to resolve.   3. Encounter for colorectal cancer screening Number given and encouraged to call to schedule colonoscopy.   4. Need for shingles vaccine Encouraged to go to pharmacy to get this done.   Gerlene Fee, [place]

## 2022-04-08 LAB — BASIC METABOLIC PANEL
BUN/Creatinine Ratio: 8 — ABNORMAL LOW (ref 10–24)
BUN: 8 mg/dL (ref 8–27)
CO2: 18 mmol/L — ABNORMAL LOW (ref 20–29)
Calcium: 9.2 mg/dL (ref 8.6–10.2)
Chloride: 102 mmol/L (ref 96–106)
Creatinine, Ser: 1 mg/dL (ref 0.76–1.27)
Glucose: 76 mg/dL (ref 70–99)
Potassium: 4.7 mmol/L (ref 3.5–5.2)
Sodium: 135 mmol/L (ref 134–144)
eGFR: 84 mL/min/{1.73_m2} (ref >59)

## 2022-04-14 ENCOUNTER — Telehealth (INDEPENDENT_AMBULATORY_CARE_PROVIDER_SITE_OTHER): Payer: Medicare Other | Admitting: Family Medicine

## 2022-04-14 ENCOUNTER — Other Ambulatory Visit (HOSPITAL_COMMUNITY): Payer: Self-pay

## 2022-04-14 ENCOUNTER — Encounter: Payer: Self-pay | Admitting: Family Medicine

## 2022-04-14 DIAGNOSIS — U071 COVID-19: Secondary | ICD-10-CM | POA: Diagnosis not present

## 2022-04-14 MED ORDER — PAXLOVID (300/100) 20 X 150 MG & 10 X 100MG PO TBPK
ORAL_TABLET | ORAL | 0 refills | Status: DC
  Filled 2022-04-14: qty 30, 5d supply, fill #0

## 2022-04-14 MED ORDER — GUAIFENESIN ER 600 MG PO TB12
600.0000 mg | ORAL_TABLET | Freq: Two times a day (BID) | ORAL | 0 refills | Status: DC
  Filled 2022-04-14: qty 30, 15d supply, fill #0

## 2022-04-14 MED ORDER — BENZONATATE 100 MG PO CAPS
200.0000 mg | ORAL_CAPSULE | Freq: Three times a day (TID) | ORAL | 0 refills | Status: DC | PRN
  Filled 2022-04-14: qty 20, 4d supply, fill #0

## 2022-04-14 NOTE — Progress Notes (Signed)
[place] Telemedicine Visit  Patient consented to have virtual visit and was identified by name and date of birth. Method of visit: Telephone  Encounter participants: Patient: Jose Li - located at home Provider: Sharion Settler - located at [facility]   Chief Complaint: COVID positive  HPI: For the last 2.5 days he reports cough, cold, congestion, body aches. Tested positive for COVID last night and this morning. Fever of 101.9 this morning. Has been taking Cold Tylenol, "it don't seem to be doing the job". Reports his breathing is "a little raggedy". Lives alone, no sick contacts.   Fully vaccinated x4. (Pass Christian x2, Moderna x1, Coca[employer]Cola x1). Received Pfizer vaccine last week.   ROS: per HPI  Pertinent PMHx: HTN, TBI, HLD, Bipolar disorder, CKD stage III on problem list but last BMP with GFR 84 and normal creatinine   Exam:  There were no vitals taken for this visit.  Respiratory: Sounds congested but able to talk clearly   Assessment/Plan:  COVID-19 virus infection Symptoms started 2 and half days ago with fever, congestion, cough, body aches.  Had 2 positive home exams. Discussed that he is within window to start anti-viral therapy. He was amanebale to this. Reviewed his home medications and advised him to hold Atorvastatin, Buspirone and Risperdal. Teach back method performed. He was advised to isolate at home for the next 3 days and continue to wear a mask for 5 days following that.  -Start Paxlovid -Rx Mucinex and Tessalon Perles -Can use Tylenol/Motrin as needed for headaches/fevers -Push fluids -Strict ED precautions provided     Time spent during visit with patient: 7 minutes

## 2022-04-14 NOTE — Telephone Encounter (Signed)
Received message from Pharmacist noting interactions with Paxlovid and Tamsulosin, Citalopram and Benztropine. Called patient to notify him of this- he will hold all these medications in addition to the ones previously mentioned during our visit.

## 2022-04-14 NOTE — Assessment & Plan Note (Signed)
Symptoms started 2 and half days ago with fever, congestion, cough, body aches.  Had 2 positive home exams. Discussed that he is within window to start anti-viral therapy. He was amanebale to this. Reviewed his home medications and advised him to hold Atorvastatin, Buspirone and Risperdal. Teach back method performed. He was advised to isolate at home for the next 3 days and continue to wear a mask for 5 days following that.  -Start Paxlovid -Rx Mucinex and Tessalon Perles -Can use Tylenol/Motrin as needed for headaches/fevers -Push fluids -Strict ED precautions provided

## 2022-04-19 ENCOUNTER — Other Ambulatory Visit: Payer: Self-pay | Admitting: Family Medicine

## 2022-04-19 ENCOUNTER — Telehealth: Payer: Self-pay

## 2022-04-19 MED ORDER — BENZONATATE 100 MG PO CAPS: 200.0000 mg | ORAL_CAPSULE | Freq: Three times a day (TID) | ORAL | 0 refills | Status: DC | PRN

## 2022-04-19 NOTE — Telephone Encounter (Signed)
Patient calls nurse line reporting adverse reaction to Paxlovid.   Patient reports he took medication for 4 days, however he was very "jittery" and did not sleep. Patient reports he stopped medication.   Patient reports he still has had a low grade fever everyday despite taking Tylenol. Tmax has been 101. Patient sates, " I just feel rotten."   Patient denies any SOB or GI symptoms.   Patient is requesting a refill on Tessalon Pearls.   Patient advised to continue to monitor symptoms and drink plenty of fluids.   Conservative measures and ED precautions given.   Will forward to provider who saw patient.

## 2022-04-19 NOTE — Telephone Encounter (Signed)
Sent in refill for [employer].

## 2022-04-20 ENCOUNTER — Encounter: Payer: Self-pay | Admitting: *Deleted

## 2022-04-21 ENCOUNTER — Encounter: Payer: Self-pay | Admitting: Student

## 2022-04-21 ENCOUNTER — Other Ambulatory Visit (HOSPITAL_COMMUNITY): Payer: Self-pay

## 2022-04-21 ENCOUNTER — Ambulatory Visit (INDEPENDENT_AMBULATORY_CARE_PROVIDER_SITE_OTHER): Payer: Medicare Other | Admitting: Student

## 2022-04-21 DIAGNOSIS — U071 COVID-19: Secondary | ICD-10-CM | POA: Diagnosis not present

## 2022-04-21 MED ORDER — AMOXICILLIN-POT CLAVULANATE 875-125 MG PO TABS
1.0000 | ORAL_TABLET | Freq: Two times a day (BID) | ORAL | 0 refills | Status: DC
  Filled 2022-04-21: qty 10, 5d supply, fill #0

## 2022-04-21 MED ORDER — ONDANSETRON HCL 8 MG PO TABS
8.0000 mg | ORAL_TABLET | Freq: Three times a day (TID) | ORAL | 0 refills | Status: DC | PRN
  Filled 2022-04-21: qty 20, 7d supply, fill #0

## 2022-04-21 NOTE — Patient Instructions (Signed)
It was great to see you! Thank you for allowing me to participate in your care!  Our plans for today:  -I have sent in a prescription for Augmentin to your pharmacy.  If you are not better within 5 days, you can take this medication in case of bacterial infection -I sent in a prescription of Zofran to your pharmacy to help with nausea and vomiting. -Please continue to drink as much fluids as you can including water, Gatorade, etc. -If you are unable to drink fluids, feel lightheaded or begin to feel worse in general, please go to the emergency department as you may need IV fluids for hydration   Take care and seek immediate care sooner if you develop any concerns.   Dr. Erick Alley, DO [facility]

## 2022-04-21 NOTE — Progress Notes (Signed)
    SUBJECTIVE:   CHIEF COMPLAINT / HPI:   Pt admits to dry cough, congestion, sinus headache, sore throat, chills, lack of appetite, vomiting a couple times per day, nausea, body aches, Tmax of 1.1.States these symptoms have been going on for a couple weeks, unsure exactly when they started. He is trying to drink water through out the day. He has been using honey, severe cold and flu medicine vicks vapor rub, and tylenol for fever. He has been taking tessalon pearls for cough but states it hasn't helped. He says his girl friend felt unwell recently with similar symptoms, not as severe. She was treated with amoxicillin and is getting better.   He was seen via video visit on 04/14/22 for similar symptoms which had been going on for 2 days and he tested positive for COVID at home. He was prescribed Paxlovid but stopped taking it because it made him feel sick and kept him up all night.   PERTINENT  PMH / PSH: None pertinent to today's visit  OBJECTIVE:   BP 117/70   Pulse (!) 126   Temp 99.1 F (37.3 C) (Oral)   Ht 6' (1.829 m)   Wt 161 lb (73 kg)   SpO2 98%   BMI 21.84 kg/m    General: NAD, pleasant, able to participate in exam HEENT: White sclera, clear conjunctiva, pearly gray none bulging TMs, dry mucous membranes, no erythema or exudate of nasopharynx Cardiac: Tachycardic, regular rhythm Respiratory: CTAB, normal effort, No wheezes, rales or rhonchi Abdomen: Bowel sounds present, nontender, nondistended, soft Extremities: no edema or cyanosis. Skin: warm and dry, no rashes noted Neuro: alert, no obvious focal deficits Psych: Normal affect and mood  ASSESSMENT/PLAN:   COVID-19 virus infection Patient's symptoms are consistent with continued COVID-19 infection.  Symptoms have been going on now for 10 days.  I suspect he will start to feel better within the next week.  I am concerned about dehydration as he is tachycardic with decreased p.o. intake.  I will prescribe Zofran for  nausea and vomiting and advised patient to increase fluid intake.  Strict return precautions were given.  Patient was advised if he becomes lightheaded, overall feels much worse, is unable to drink fluids, he should go to the emergency department as he may need IV fluids.  I do think his symptoms are likely viral in nature but it is possible he could be developing a sinusitis. Shared decision making was used and a prescription for Augmentin was sent to patient's pharmacy and he was advised to only take this if he continues to have sick symptoms after 5 more days  Dr. Precious Gilding, [place]

## 2022-04-21 NOTE — Assessment & Plan Note (Addendum)
Patient's symptoms are consistent with continued COVID-19 infection.  Symptoms have been going on now for 10 days.  I suspect he will start to feel better within the next week.  I am concerned about dehydration as he is tachycardic with decreased p.o. intake.  I will prescribe Zofran for nausea and vomiting and advised patient to increase fluid intake.  Strict return precautions were given.  Patient was advised if he becomes lightheaded, overall feels much worse, is unable to drink fluids, he should go to the emergency department as he may need IV fluids.  I do think his symptoms are likely viral in nature but it is possible he could be developing a sinusitis. Shared decision making was used and a prescription for Augmentin was sent to patient's pharmacy and he was advised to only take this if he continues to have sick symptoms after 5 more days

## 2022-06-01 ENCOUNTER — Other Ambulatory Visit: Payer: Self-pay | Admitting: Internal Medicine

## 2022-06-07 ENCOUNTER — Emergency Department (HOSPITAL_COMMUNITY): Payer: Medicare Other

## 2022-06-07 ENCOUNTER — Inpatient Hospital Stay (HOSPITAL_COMMUNITY)
Admission: EM | Admit: 2022-06-07 | Discharge: 2022-07-06 | DRG: 025 | Disposition: A | Discharge status: Skilled Nursing Facility | Payer: Medicare Other | Attending: Neurosurgery | Admitting: Neurosurgery

## 2022-06-07 DIAGNOSIS — N1832 Chronic kidney disease, stage 3b: Secondary | ICD-10-CM | POA: Diagnosis not present

## 2022-06-07 DIAGNOSIS — R4182 Altered mental status, unspecified: Secondary | ICD-10-CM

## 2022-06-07 DIAGNOSIS — I129 Hypertensive chronic kidney disease with stage 1 through stage 4 chronic kidney disease, or unspecified chronic kidney disease: Secondary | ICD-10-CM | POA: Diagnosis present

## 2022-06-07 DIAGNOSIS — R402142 Coma scale, eyes open, spontaneous, at arrival to emergency department: Secondary | ICD-10-CM | POA: Diagnosis present

## 2022-06-07 DIAGNOSIS — Z6822 Body mass index (BMI) 22.0-22.9, adult: Secondary | ICD-10-CM | POA: Diagnosis not present

## 2022-06-07 DIAGNOSIS — Z9889 Other specified postprocedural states: Secondary | ICD-10-CM

## 2022-06-07 DIAGNOSIS — G4730 Sleep apnea, unspecified: Secondary | ICD-10-CM | POA: Diagnosis not present

## 2022-06-07 DIAGNOSIS — F0781 Postconcussional syndrome: Secondary | ICD-10-CM | POA: Diagnosis present

## 2022-06-07 DIAGNOSIS — R471 Dysarthria and anarthria: Secondary | ICD-10-CM | POA: Diagnosis not present

## 2022-06-07 DIAGNOSIS — Z8249 Family history of ischemic heart disease and other diseases of the circulatory system: Secondary | ICD-10-CM

## 2022-06-07 DIAGNOSIS — I6529 Occlusion and stenosis of unspecified carotid artery: Secondary | ICD-10-CM | POA: Diagnosis not present

## 2022-06-07 DIAGNOSIS — L89012 Pressure ulcer of right elbow, stage 2: Secondary | ICD-10-CM | POA: Diagnosis not present

## 2022-06-07 DIAGNOSIS — E440 Moderate protein-calorie malnutrition: Secondary | ICD-10-CM | POA: Diagnosis not present

## 2022-06-07 DIAGNOSIS — Z634 Disappearance and death of family member: Secondary | ICD-10-CM

## 2022-06-07 DIAGNOSIS — I951 Orthostatic hypotension: Secondary | ICD-10-CM | POA: Diagnosis not present

## 2022-06-07 DIAGNOSIS — R410 Disorientation, unspecified: Secondary | ICD-10-CM | POA: Diagnosis not present

## 2022-06-07 DIAGNOSIS — Z043 Encounter for examination and observation following other accident: Secondary | ICD-10-CM | POA: Diagnosis not present

## 2022-06-07 DIAGNOSIS — R000 Tachycardia, unspecified: Secondary | ICD-10-CM | POA: Diagnosis not present

## 2022-06-07 DIAGNOSIS — Z96641 Presence of right artificial hip joint: Secondary | ICD-10-CM | POA: Diagnosis present

## 2022-06-07 DIAGNOSIS — Z751 Person awaiting admission to adequate facility elsewhere: Secondary | ICD-10-CM

## 2022-06-07 DIAGNOSIS — Z833 Family history of diabetes mellitus: Secondary | ICD-10-CM

## 2022-06-07 DIAGNOSIS — I499 Cardiac arrhythmia, unspecified: Secondary | ICD-10-CM | POA: Diagnosis not present

## 2022-06-07 DIAGNOSIS — I6200 Nontraumatic subdural hemorrhage, unspecified: Secondary | ICD-10-CM

## 2022-06-07 DIAGNOSIS — S065X0A Traumatic subdural hemorrhage without loss of consciousness, initial encounter: Secondary | ICD-10-CM | POA: Diagnosis not present

## 2022-06-07 DIAGNOSIS — Z888 Allergy status to other drugs, medicaments and biological substances status: Secondary | ICD-10-CM

## 2022-06-07 DIAGNOSIS — I615 Nontraumatic intracerebral hemorrhage, intraventricular: Secondary | ICD-10-CM | POA: Diagnosis not present

## 2022-06-07 DIAGNOSIS — R55 Syncope and collapse: Secondary | ICD-10-CM | POA: Diagnosis not present

## 2022-06-07 DIAGNOSIS — G4733 Obstructive sleep apnea (adult) (pediatric): Secondary | ICD-10-CM | POA: Diagnosis present

## 2022-06-07 DIAGNOSIS — L8990 Pressure ulcer of unspecified site, unspecified stage: Secondary | ICD-10-CM | POA: Insufficient documentation

## 2022-06-07 DIAGNOSIS — R402242 Coma scale, best verbal response, confused conversation, at arrival to emergency department: Secondary | ICD-10-CM | POA: Diagnosis present

## 2022-06-07 DIAGNOSIS — Z781 Physical restraint status: Secondary | ICD-10-CM

## 2022-06-07 DIAGNOSIS — G4489 Other headache syndrome: Secondary | ICD-10-CM | POA: Diagnosis not present

## 2022-06-07 DIAGNOSIS — Z7982 Long term (current) use of aspirin: Secondary | ICD-10-CM

## 2022-06-07 DIAGNOSIS — Z743 Need for continuous supervision: Secondary | ICD-10-CM | POA: Diagnosis not present

## 2022-06-07 DIAGNOSIS — I10 Essential (primary) hypertension: Secondary | ICD-10-CM | POA: Diagnosis not present

## 2022-06-07 DIAGNOSIS — W19XXXA Unspecified fall, initial encounter: Principal | ICD-10-CM

## 2022-06-07 DIAGNOSIS — Z881 Allergy status to other antibiotic agents status: Secondary | ICD-10-CM | POA: Diagnosis not present

## 2022-06-07 DIAGNOSIS — L89022 Pressure ulcer of left elbow, stage 2: Secondary | ICD-10-CM | POA: Diagnosis not present

## 2022-06-07 DIAGNOSIS — S0512XA Contusion of eyeball and orbital tissues, left eye, initial encounter: Secondary | ICD-10-CM | POA: Diagnosis not present

## 2022-06-07 DIAGNOSIS — F05 Delirium due to known physiological condition: Secondary | ICD-10-CM | POA: Diagnosis not present

## 2022-06-07 DIAGNOSIS — G935 Compression of brain: Secondary | ICD-10-CM | POA: Diagnosis not present

## 2022-06-07 DIAGNOSIS — R519 Headache, unspecified: Secondary | ICD-10-CM | POA: Diagnosis not present

## 2022-06-07 DIAGNOSIS — G319 Degenerative disease of nervous system, unspecified: Secondary | ICD-10-CM | POA: Diagnosis not present

## 2022-06-07 DIAGNOSIS — Z79899 Other long term (current) drug therapy: Secondary | ICD-10-CM

## 2022-06-07 DIAGNOSIS — F1010 Alcohol abuse, uncomplicated: Secondary | ICD-10-CM | POA: Diagnosis present

## 2022-06-07 DIAGNOSIS — W1830XA Fall on same level, unspecified, initial encounter: Secondary | ICD-10-CM | POA: Diagnosis present

## 2022-06-07 DIAGNOSIS — F17210 Nicotine dependence, cigarettes, uncomplicated: Secondary | ICD-10-CM | POA: Diagnosis not present

## 2022-06-07 DIAGNOSIS — R402362 Coma scale, best motor response, obeys commands, at arrival to emergency department: Secondary | ICD-10-CM | POA: Diagnosis present

## 2022-06-07 DIAGNOSIS — E785 Hyperlipidemia, unspecified: Secondary | ICD-10-CM | POA: Diagnosis not present

## 2022-06-07 DIAGNOSIS — S065XAA Traumatic subdural hemorrhage with loss of consciousness status unknown, initial encounter: Principal | ICD-10-CM | POA: Diagnosis present

## 2022-06-07 DIAGNOSIS — R6889 Other general symptoms and signs: Secondary | ICD-10-CM | POA: Diagnosis not present

## 2022-06-07 DIAGNOSIS — S199XXA Unspecified injury of neck, initial encounter: Secondary | ICD-10-CM | POA: Diagnosis not present

## 2022-06-07 DIAGNOSIS — G40909 Epilepsy, unspecified, not intractable, without status epilepticus: Secondary | ICD-10-CM | POA: Diagnosis present

## 2022-06-07 LAB — CBC WITH DIFFERENTIAL/PLATELET
Abs Immature Granulocytes: 0.1 10*3/uL — ABNORMAL HIGH (ref 0.00–0.07)
Basophils Absolute: 0.1 10*3/uL (ref 0.0–0.1)
Basophils Relative: 0 %
Eosinophils Absolute: 0 10*3/uL (ref 0.0–0.5)
Eosinophils Relative: 0 %
HCT: 39.1 % (ref 39.0–52.0)
Hemoglobin: 13.3 g/dL (ref 13.0–17.0)
Immature Granulocytes: 1 %
Lymphocytes Relative: 2 %
Lymphs Abs: 0.3 10*3/uL — ABNORMAL LOW (ref 0.7–4.0)
MCH: 32.5 pg (ref 26.0–34.0)
MCHC: 34 g/dL (ref 30.0–36.0)
MCV: 95.6 fL (ref 80.0–100.0)
Monocytes Absolute: 1.7 10*3/uL — ABNORMAL HIGH (ref 0.1–1.0)
Monocytes Relative: 9 %
Neutro Abs: 16.6 10*3/uL — ABNORMAL HIGH (ref 1.7–7.7)
Neutrophils Relative %: 88 %
Platelets: 320 10*3/uL (ref 150–400)
RBC: 4.09 MIL/uL — ABNORMAL LOW (ref 4.22–5.81)
RDW: 12.6 % (ref 11.5–15.5)
WBC: 18.8 10*3/uL — ABNORMAL HIGH (ref 4.0–10.5)
nRBC: 0 % (ref 0.0–0.2)

## 2022-06-07 LAB — RAPID URINE DRUG SCREEN, HOSP PERFORMED
Amphetamines: NOT DETECTED
Barbiturates: NOT DETECTED
Benzodiazepines: NOT DETECTED
Cocaine: NOT DETECTED
Opiates: NOT DETECTED
Tetrahydrocannabinol: NOT DETECTED

## 2022-06-07 LAB — APTT: aPTT: 25 seconds (ref 24–36)

## 2022-06-07 LAB — COMPREHENSIVE METABOLIC PANEL
ALT: 22 U/L (ref 0–44)
AST: 39 U/L (ref 15–41)
Albumin: 3.9 g/dL (ref 3.5–5.0)
Alkaline Phosphatase: 63 U/L (ref 38–126)
Anion gap: 11 (ref 5–15)
BUN: 7 mg/dL — ABNORMAL LOW (ref 8–23)
CO2: 19 mmol/L — ABNORMAL LOW (ref 22–32)
Calcium: 8.7 mg/dL — ABNORMAL LOW (ref 8.9–10.3)
Chloride: 103 mmol/L (ref 98–111)
Creatinine, Ser: 1.05 mg/dL (ref 0.61–1.24)
GFR, Estimated: >60 mL/min (ref >60)
Glucose, Bld: 117 mg/dL — ABNORMAL HIGH (ref 70–99)
Potassium: 4.6 mmol/L (ref 3.5–5.1)
Sodium: 133 mmol/L — ABNORMAL LOW (ref 135–145)
Total Bilirubin: 1.1 mg/dL (ref 0.3–1.2)
Total Protein: 6.3 g/dL — ABNORMAL LOW (ref 6.5–8.1)

## 2022-06-07 LAB — URINALYSIS, ROUTINE W REFLEX MICROSCOPIC
Bilirubin Urine: NEGATIVE
Glucose, UA: NEGATIVE mg/dL
Hgb urine dipstick: NEGATIVE
Ketones, ur: 5 mg/dL — AB
Leukocytes,Ua: NEGATIVE
Nitrite: NEGATIVE
Protein, ur: NEGATIVE mg/dL
Specific Gravity, Urine: 1.016 (ref 1.005–1.030)
pH: 5 (ref 5.0–8.0)

## 2022-06-07 LAB — PROTIME-INR
INR: 1 (ref 0.8–1.2)
Prothrombin Time: 13.2 seconds (ref 11.4–15.2)

## 2022-06-07 LAB — LACTIC ACID, PLASMA: Lactic Acid, Venous: 1.2 mmol/L (ref 0.5–1.9)

## 2022-06-07 LAB — ETHANOL: Alcohol, Ethyl (B): <10 mg/dL (ref <10)

## 2022-06-07 MED ORDER — LIDOCAINE HCL (PF) 1 % IJ SOLN
10.0000 mL | Freq: Once | INTRAMUSCULAR | Status: AC
  Administered 2022-06-07: 10 mL

## 2022-06-07 MED ORDER — LIDOCAINE HCL (PF) 1 % IJ SOLN
INTRAMUSCULAR | Status: AC
  Filled 2022-06-07: qty 30

## 2022-06-07 MED ORDER — LACTATED RINGERS IV BOLUS
500.0000 mL | Freq: Once | INTRAVENOUS | Status: AC
  Administered 2022-06-08: 500 mL via INTRAVENOUS

## 2022-06-07 MED ORDER — LEVETIRACETAM IN NACL 1500 MG/100ML IV SOLN
1500.0000 mg | Freq: Once | INTRAVENOUS | Status: AC
  Administered 2022-06-07: 1500 mg via INTRAVENOUS
  Filled 2022-06-07: qty 100

## 2022-06-07 NOTE — ED Provider Notes (Signed)
[facility] EMERGENCY DEPARTMENT Provider Note   CSN: [id number] Arrival date & time: 06/07/22  2022     History {Add pertinent medical, surgical, social history, OB history to HPI:1} Chief Complaint  Patient presents with   Loss of Consciousness    Jose Li is a 66 y.o. male.   Loss of Consciousness  Patient is a 66 year old male with a history of HTN, anemia, anxiety, HLD, seizures on Keppra 1500 twice daily, CKD     Home Medications Prior to Admission medications   Medication Sig Start Date End Date Taking? Authorizing Provider  acetaminophen (TYLENOL 8 HOUR) 650 MG CR tablet Take 1 tablet (650 mg total) by mouth every 8 (eight) hours as needed for pain. 12/03/21   Dana Allan, MD  amoxicillin-clavulanate (AUGMENTIN) 875-125 MG tablet Take 1 tablet by mouth 2 (two) times daily for 5 days. 04/21/22   Erick Alley, DO  aspirin 81 MG EC tablet Take 81 mg by mouth daily.    [provider]  atorvastatin (LIPITOR) 20 MG tablet Take 1 tablet by mouth once daily 06/01/22   Pricilla Riffle, MD  benzonatate (TESSALON PERLES) 100 MG capsule Take 2 capsules (200 mg total) by mouth 3 (three) times daily as needed for cough. 04/19/22   Sabino Dick, DO  benztropine (COGENTIN) 1 MG tablet Take 1 mg by mouth daily.     [provider]  busPIRone (BUSPAR) 5 MG tablet Take 5 mg by mouth 2 (two) times daily.  07/06/17   [provider]  citalopram (CELEXA) 20 MG tablet Take 20 mg by mouth daily.    [provider]  diclofenac Sodium (VOLTAREN) 1 % GEL Apply 4 g topically 4 (four) times daily. 12/03/21   Dana Allan, MD  EQ ALLERGY RELIEF, CETIRIZINE, 10 MG tablet Take 1 tablet by mouth once daily 03/08/22   Autry-Lott, Randa Evens, DO  guaiFENesin (MUCINEX) 600 MG 12 hr tablet Take 1 tablet (600 mg total) by mouth 2 (two) times daily. 04/14/22   Sabino Dick, DO  levETIRAcetam (KEPPRA) 750 MG tablet Take 2 tablets (1,500 mg total) by  mouth 2 (two) times daily. 12/29/21   Glean Salvo, NP  midodrine (PROAMATINE) 5 MG tablet TAKE 2 TABLETS BY MOUTH ONCE DAILY AT  6  AM,  THEN  1  TABLET  AT  10  AM  AND  1  TABLET  AT  2  PM 12/21/21   Pricilla Riffle, MD  Multiple Vitamin (MULTIVITAMIN) tablet Take 1 tablet by mouth daily.    [provider]  nirmatrelvir & ritonavir (PAXLOVID, 300/100,) 20 x 150 MG & 10 x 100MG  TBPK Nirmatrelvir 300 mg (two 150-mg tablets) with ritonavir 100 mg (one 100-mg tablet); administer all three tablets together orally twice daily with or without food for 5 days 04/14/22   04/16/22, DO  ondansetron (ZOFRAN) 8 MG tablet Take 1 tablet (8 mg total) by mouth every 8 (eight) hours as needed for nausea or vomiting. 04/21/22   06/21/22, DO  potassium chloride SA (KLOR-CON) 20 MEQ tablet Take 20 mEq by mouth daily.    [provider]  pregabalin (LYRICA) 75 MG capsule Take 1 capsule (75 mg total) by mouth 2 (two) times daily. 12/29/21   12/31/21, NP  risperiDONE (RISPERDAL) 2 MG tablet Take 2 mg by mouth at bedtime.     [provider]  tamsulosin (FLOMAX) 0.4 MG CAPS capsule Take 0.4 mg by mouth  daily. 08/20/20   [provider]  thiamine 100 MG tablet Take 100 mg by mouth daily.    [provider]  traZODone (DESYREL) 150 MG tablet Take 150 mg by mouth at bedtime.  09/24/15   [provider]      Allergies    Duloxetine, Erythromycin, Hctz [hydrochlorothiazide], and Propranolol    Review of Systems   Review of Systems  Cardiovascular:  Positive for syncope.    Physical Exam Updated Vital Signs BP (!) 164/80 (BP Location: Left Arm)   Pulse (!) 120   Temp 97.7 F (36.5 C) (Temporal)   Resp 20   SpO2 97%  Physical Exam  ED Results / Procedures / Treatments   Labs (all labs ordered are listed, but only abnormal results are displayed) Labs Reviewed - No data to display  EKG None  Radiology No results  found.  Procedures Procedures  {Document cardiac monitor, telemetry assessment procedure when appropriate:1}  Medications Ordered in ED Medications - No data to display  ED Course/ Medical Decision Making/ A&P                           Medical Decision Making Amount and/or Complexity of Data Reviewed Labs: ordered. Radiology: ordered. ECG/medicine tests: ordered.   ***  {Document critical care time when appropriate:1} {Document review of labs and clinical decision tools ie heart score, Chads2Vasc2 etc:1}  {Document your independent review of radiology images, and any outside records:1} {Document your discussion with family members, caretakers, and with consultants:1} {Document social determinants of health affecting pt's care:1} {Document your decision making why or why not admission, treatments were needed:1} Final Clinical Impression(s) / ED Diagnoses Final diagnoses:  None    Rx / DC Orders ED Discharge Orders     None

## 2022-06-07 NOTE — ED Triage Notes (Signed)
Pt via [facility] after wellness check approx 7pm. Pt found face down on floor in bedroom; Pt A&O3 on arrival, unsure what happened PTA. Hematoma noted to head, c/o HA. Hx seizures, compliant w meds  Orthostatic changes 150/70 -> 90/50 , fluid HR 120  Flu dx 2wks ago 20g L FA

## 2022-06-08 ENCOUNTER — Other Ambulatory Visit: Payer: Self-pay

## 2022-06-08 ENCOUNTER — Emergency Department (HOSPITAL_COMMUNITY): Payer: Medicare Other | Admitting: Anesthesiology

## 2022-06-08 ENCOUNTER — Inpatient Hospital Stay (HOSPITAL_COMMUNITY): Payer: Medicare Other

## 2022-06-08 ENCOUNTER — Emergency Department (EMERGENCY_DEPARTMENT_HOSPITAL): Payer: Medicare Other | Admitting: Anesthesiology

## 2022-06-08 ENCOUNTER — Emergency Department (HOSPITAL_COMMUNITY): Payer: Medicare Other

## 2022-06-08 ENCOUNTER — Encounter (HOSPITAL_COMMUNITY): Payer: Self-pay | Admitting: Neurosurgery

## 2022-06-08 ENCOUNTER — Encounter (HOSPITAL_COMMUNITY)
Admission: EM | Disposition: A | Discharge status: Skilled Nursing Facility | Payer: Self-pay | Source: Home / Self Care | Attending: Neurosurgery

## 2022-06-08 ENCOUNTER — Ambulatory Visit: Payer: Medicare Other

## 2022-06-08 DIAGNOSIS — L89022 Pressure ulcer of left elbow, stage 2: Secondary | ICD-10-CM | POA: Diagnosis not present

## 2022-06-08 DIAGNOSIS — R279 Unspecified lack of coordination: Secondary | ICD-10-CM | POA: Diagnosis not present

## 2022-06-08 DIAGNOSIS — R1310 Dysphagia, unspecified: Secondary | ICD-10-CM | POA: Diagnosis not present

## 2022-06-08 DIAGNOSIS — R471 Dysarthria and anarthria: Secondary | ICD-10-CM | POA: Diagnosis present

## 2022-06-08 DIAGNOSIS — F1010 Alcohol abuse, uncomplicated: Secondary | ICD-10-CM | POA: Diagnosis present

## 2022-06-08 DIAGNOSIS — R55 Syncope and collapse: Secondary | ICD-10-CM | POA: Diagnosis present

## 2022-06-08 DIAGNOSIS — G4730 Sleep apnea, unspecified: Secondary | ICD-10-CM | POA: Diagnosis not present

## 2022-06-08 DIAGNOSIS — S82302D Unspecified fracture of lower end of left tibia, subsequent encounter for closed fracture with routine healing: Secondary | ICD-10-CM | POA: Diagnosis not present

## 2022-06-08 DIAGNOSIS — S0512XA Contusion of eyeball and orbital tissues, left eye, initial encounter: Secondary | ICD-10-CM | POA: Diagnosis not present

## 2022-06-08 DIAGNOSIS — Z7982 Long term (current) use of aspirin: Secondary | ICD-10-CM | POA: Diagnosis not present

## 2022-06-08 DIAGNOSIS — F17210 Nicotine dependence, cigarettes, uncomplicated: Secondary | ICD-10-CM

## 2022-06-08 DIAGNOSIS — W1830XA Fall on same level, unspecified, initial encounter: Secondary | ICD-10-CM | POA: Diagnosis present

## 2022-06-08 DIAGNOSIS — Z9889 Other specified postprocedural states: Secondary | ICD-10-CM

## 2022-06-08 DIAGNOSIS — I10 Essential (primary) hypertension: Secondary | ICD-10-CM

## 2022-06-08 DIAGNOSIS — I6529 Occlusion and stenosis of unspecified carotid artery: Secondary | ICD-10-CM | POA: Diagnosis not present

## 2022-06-08 DIAGNOSIS — E039 Hypothyroidism, unspecified: Secondary | ICD-10-CM | POA: Diagnosis not present

## 2022-06-08 DIAGNOSIS — G9389 Other specified disorders of brain: Secondary | ICD-10-CM | POA: Diagnosis not present

## 2022-06-08 DIAGNOSIS — S065X0A Traumatic subdural hemorrhage without loss of consciousness, initial encounter: Secondary | ICD-10-CM | POA: Diagnosis not present

## 2022-06-08 DIAGNOSIS — I951 Orthostatic hypotension: Secondary | ICD-10-CM | POA: Diagnosis present

## 2022-06-08 DIAGNOSIS — F05 Delirium due to known physiological condition: Secondary | ICD-10-CM | POA: Diagnosis not present

## 2022-06-08 DIAGNOSIS — S065XAA Traumatic subdural hemorrhage with loss of consciousness status unknown, initial encounter: Secondary | ICD-10-CM

## 2022-06-08 DIAGNOSIS — R2689 Other abnormalities of gait and mobility: Secondary | ICD-10-CM | POA: Diagnosis not present

## 2022-06-08 DIAGNOSIS — G319 Degenerative disease of nervous system, unspecified: Secondary | ICD-10-CM | POA: Diagnosis not present

## 2022-06-08 DIAGNOSIS — M6281 Muscle weakness (generalized): Secondary | ICD-10-CM | POA: Diagnosis not present

## 2022-06-08 DIAGNOSIS — F0781 Postconcussional syndrome: Secondary | ICD-10-CM | POA: Diagnosis present

## 2022-06-08 DIAGNOSIS — R262 Difficulty in walking, not elsewhere classified: Secondary | ICD-10-CM | POA: Diagnosis not present

## 2022-06-08 DIAGNOSIS — S92151D Displaced avulsion fracture (chip fracture) of right talus, subsequent encounter for fracture with routine healing: Secondary | ICD-10-CM | POA: Diagnosis not present

## 2022-06-08 DIAGNOSIS — I615 Nontraumatic intracerebral hemorrhage, intraventricular: Secondary | ICD-10-CM | POA: Diagnosis not present

## 2022-06-08 DIAGNOSIS — G40909 Epilepsy, unspecified, not intractable, without status epilepticus: Secondary | ICD-10-CM | POA: Diagnosis present

## 2022-06-08 DIAGNOSIS — S065X0D Traumatic subdural hemorrhage without loss of consciousness, subsequent encounter: Secondary | ICD-10-CM | POA: Diagnosis not present

## 2022-06-08 DIAGNOSIS — N1832 Chronic kidney disease, stage 3b: Secondary | ICD-10-CM | POA: Diagnosis not present

## 2022-06-08 DIAGNOSIS — Z881 Allergy status to other antibiotic agents status: Secondary | ICD-10-CM | POA: Diagnosis not present

## 2022-06-08 DIAGNOSIS — E440 Moderate protein-calorie malnutrition: Secondary | ICD-10-CM | POA: Diagnosis not present

## 2022-06-08 DIAGNOSIS — G4733 Obstructive sleep apnea (adult) (pediatric): Secondary | ICD-10-CM | POA: Diagnosis present

## 2022-06-08 DIAGNOSIS — R402142 Coma scale, eyes open, spontaneous, at arrival to emergency department: Secondary | ICD-10-CM | POA: Diagnosis present

## 2022-06-08 DIAGNOSIS — L89012 Pressure ulcer of right elbow, stage 2: Secondary | ICD-10-CM | POA: Diagnosis not present

## 2022-06-08 DIAGNOSIS — Z6822 Body mass index (BMI) 22.0-22.9, adult: Secondary | ICD-10-CM | POA: Diagnosis not present

## 2022-06-08 DIAGNOSIS — Z48811 Encounter for surgical aftercare following surgery on the nervous system: Secondary | ICD-10-CM | POA: Diagnosis not present

## 2022-06-08 DIAGNOSIS — I6200 Nontraumatic subdural hemorrhage, unspecified: Secondary | ICD-10-CM | POA: Diagnosis not present

## 2022-06-08 DIAGNOSIS — R296 Repeated falls: Secondary | ICD-10-CM | POA: Diagnosis not present

## 2022-06-08 DIAGNOSIS — R402242 Coma scale, best verbal response, confused conversation, at arrival to emergency department: Secondary | ICD-10-CM | POA: Diagnosis present

## 2022-06-08 DIAGNOSIS — I129 Hypertensive chronic kidney disease with stage 1 through stage 4 chronic kidney disease, or unspecified chronic kidney disease: Secondary | ICD-10-CM | POA: Diagnosis not present

## 2022-06-08 DIAGNOSIS — Z888 Allergy status to other drugs, medicaments and biological substances status: Secondary | ICD-10-CM | POA: Diagnosis not present

## 2022-06-08 DIAGNOSIS — E785 Hyperlipidemia, unspecified: Secondary | ICD-10-CM | POA: Diagnosis not present

## 2022-06-08 DIAGNOSIS — F17200 Nicotine dependence, unspecified, uncomplicated: Secondary | ICD-10-CM | POA: Diagnosis not present

## 2022-06-08 DIAGNOSIS — Z79899 Other long term (current) drug therapy: Secondary | ICD-10-CM | POA: Diagnosis not present

## 2022-06-08 DIAGNOSIS — G935 Compression of brain: Secondary | ICD-10-CM | POA: Diagnosis not present

## 2022-06-08 DIAGNOSIS — S199XXA Unspecified injury of neck, initial encounter: Secondary | ICD-10-CM | POA: Diagnosis not present

## 2022-06-08 DIAGNOSIS — Z4682 Encounter for fitting and adjustment of non-vascular catheter: Secondary | ICD-10-CM | POA: Diagnosis not present

## 2022-06-08 DIAGNOSIS — Z634 Disappearance and death of family member: Secondary | ICD-10-CM | POA: Diagnosis not present

## 2022-06-08 DIAGNOSIS — I6381 Other cerebral infarction due to occlusion or stenosis of small artery: Secondary | ICD-10-CM | POA: Diagnosis not present

## 2022-06-08 DIAGNOSIS — Z720 Tobacco use: Secondary | ICD-10-CM | POA: Diagnosis not present

## 2022-06-08 HISTORY — PX: CRANIOTOMY: SHX93

## 2022-06-08 LAB — BASIC METABOLIC PANEL
Anion gap: 7 (ref 5–15)
BUN: 7 mg/dL — ABNORMAL LOW (ref 8–23)
CO2: 21 mmol/L — ABNORMAL LOW (ref 22–32)
Calcium: 8 mg/dL — ABNORMAL LOW (ref 8.9–10.3)
Chloride: 104 mmol/L (ref 98–111)
Creatinine, Ser: 0.94 mg/dL (ref 0.61–1.24)
GFR, Estimated: >60 mL/min (ref >60)
Glucose, Bld: 184 mg/dL — ABNORMAL HIGH (ref 70–99)
Potassium: 4 mmol/L (ref 3.5–5.1)
Sodium: 132 mmol/L — ABNORMAL LOW (ref 135–145)

## 2022-06-08 LAB — TYPE AND SCREEN
ABO/RH(D): A POS
Antibody Screen: NEGATIVE

## 2022-06-08 LAB — CBC
HCT: 31.1 % — ABNORMAL LOW (ref 39.0–52.0)
Hemoglobin: 10.8 g/dL — ABNORMAL LOW (ref 13.0–17.0)
MCH: 32.9 pg (ref 26.0–34.0)
MCHC: 34.7 g/dL (ref 30.0–36.0)
MCV: 94.8 fL (ref 80.0–100.0)
Platelets: 316 10*3/uL (ref 150–400)
RBC: 3.28 MIL/uL — ABNORMAL LOW (ref 4.22–5.81)
RDW: 12.7 % (ref 11.5–15.5)
WBC: 14.7 10*3/uL — ABNORMAL HIGH (ref 4.0–10.5)
nRBC: 0 % (ref 0.0–0.2)

## 2022-06-08 LAB — MRSA NEXT GEN BY PCR, NASAL: MRSA by PCR Next Gen: NOT DETECTED

## 2022-06-08 LAB — GLUCOSE, CAPILLARY: Glucose-Capillary: 172 mg/dL — ABNORMAL HIGH (ref 70–99)

## 2022-06-08 LAB — ABO/RH: ABO/RH(D): A POS

## 2022-06-08 SURGERY — CRANIOTOMY HEMATOMA EVACUATION SUBDURAL
Anesthesia: General | Site: Head | Laterality: Left

## 2022-06-08 MED ORDER — CEFAZOLIN SODIUM-DEXTROSE 2-4 GM/100ML-% IV SOLN
2.0000 g | Freq: Three times a day (TID) | INTRAVENOUS | Status: AC
  Administered 2022-06-08 (×2): 2 g via INTRAVENOUS
  Filled 2022-06-08 (×2): qty 100

## 2022-06-08 MED ORDER — ARTIFICIAL TEARS OPHTHALMIC OINT
TOPICAL_OINTMENT | OPHTHALMIC | Status: AC
Start: 2022-06-08 — End: ?
  Filled 2022-06-08: qty 3.5

## 2022-06-08 MED ORDER — POTASSIUM CHLORIDE CRYS ER 20 MEQ PO TBCR: 20.0000 meq | EXTENDED_RELEASE_TABLET | Freq: Every day | ORAL | Status: DC

## 2022-06-08 MED ORDER — PROMETHAZINE HCL 25 MG PO TABS: 12.5000 mg | ORAL_TABLET | ORAL | Status: DC | PRN

## 2022-06-08 MED ORDER — FENTANYL CITRATE (PF) 250 MCG/5ML IJ SOLN
INTRAMUSCULAR | Status: AC
  Filled 2022-06-08: qty 5

## 2022-06-08 MED ORDER — SUCCINYLCHOLINE CHLORIDE 200 MG/10ML IV SOSY
PREFILLED_SYRINGE | INTRAVENOUS | Status: DC | PRN
  Administered 2022-06-08: 120 mg via INTRAVENOUS

## 2022-06-08 MED ORDER — ONDANSETRON HCL 4 MG PO TABS: 4.0000 mg | ORAL_TABLET | ORAL | Status: DC | PRN

## 2022-06-08 MED ORDER — SUCCINYLCHOLINE CHLORIDE 200 MG/10ML IV SOSY
PREFILLED_SYRINGE | INTRAVENOUS | Status: AC
Start: 2022-06-08 — End: ?
  Filled 2022-06-08: qty 10

## 2022-06-08 MED ORDER — HYDROMORPHONE HCL 1 MG/ML IJ SOLN
0.5000 mg | INTRAMUSCULAR | Status: DC | PRN
  Administered 2022-06-08: 0.5 mg via INTRAVENOUS
  Administered 2022-06-08: 1 mg via INTRAVENOUS
  Administered 2022-06-08 – 2022-06-09 (×3): 0.5 mg via INTRAVENOUS
  Administered 2022-06-09 – 2022-06-22 (×32): 1 mg via INTRAVENOUS
  Filled 2022-06-08 (×38): qty 1

## 2022-06-08 MED ORDER — FENTANYL CITRATE (PF) 100 MCG/2ML IJ SOLN
25.0000 ug | INTRAMUSCULAR | Status: DC | PRN
  Administered 2022-06-08 (×3): 50 ug via INTRAVENOUS

## 2022-06-08 MED ORDER — EPHEDRINE 5 MG/ML INJ
INTRAVENOUS | Status: AC
Start: 2022-06-08 — End: ?
  Filled 2022-06-08: qty 5

## 2022-06-08 MED ORDER — SODIUM CHLORIDE 0.9 % IV SOLN: INTRAVENOUS | Status: DC | PRN

## 2022-06-08 MED ORDER — PREGABALIN 75 MG PO CAPS: 75.0000 mg | ORAL_CAPSULE | Freq: Two times a day (BID) | ORAL | Status: DC

## 2022-06-08 MED ORDER — TRAZODONE HCL 50 MG PO TABS: 150.0000 mg | ORAL_TABLET | Freq: Every day | ORAL | Status: DC

## 2022-06-08 MED ORDER — PHENYLEPHRINE 80 MCG/ML (10ML) SYRINGE FOR IV PUSH (FOR BLOOD PRESSURE SUPPORT)
PREFILLED_SYRINGE | INTRAVENOUS | Status: AC
Start: 2022-06-08 — End: ?
  Filled 2022-06-08: qty 10

## 2022-06-08 MED ORDER — ACETAMINOPHEN 500 MG PO TABS
1000.0000 mg | ORAL_TABLET | Freq: Once | ORAL | Status: AC
  Administered 2022-06-08: 1000 mg via ORAL
  Filled 2022-06-08: qty 2

## 2022-06-08 MED ORDER — SUGAMMADEX SODIUM 200 MG/2ML IV SOLN
INTRAVENOUS | Status: DC | PRN
  Administered 2022-06-08: 200 mg via INTRAVENOUS

## 2022-06-08 MED ORDER — LEVETIRACETAM IN NACL 1500 MG/100ML IV SOLN
1500.0000 mg | Freq: Two times a day (BID) | INTRAVENOUS | Status: DC
  Administered 2022-06-08 – 2022-06-15 (×16): 1500 mg via INTRAVENOUS
  Filled 2022-06-08 (×16): qty 100

## 2022-06-08 MED ORDER — CEFAZOLIN SODIUM 1 G IJ SOLR
INTRAMUSCULAR | Status: AC
  Filled 2022-06-08: qty 20

## 2022-06-08 MED ORDER — ACETAMINOPHEN 650 MG RE SUPP: 650.0000 mg | RECTAL | Status: DC | PRN

## 2022-06-08 MED ORDER — MICROFIBRILLAR COLL HEMOSTAT EX PADS
MEDICATED_PAD | CUTANEOUS | Status: DC | PRN
  Administered 2022-06-08: 1 via TOPICAL

## 2022-06-08 MED ORDER — LIDOCAINE 2% (20 MG/ML) 5 ML SYRINGE
INTRAMUSCULAR | Status: AC
  Filled 2022-06-08: qty 5

## 2022-06-08 MED ORDER — ONDANSETRON HCL 4 MG/2ML IJ SOLN
INTRAMUSCULAR | Status: DC | PRN
  Administered 2022-06-08: 4 mg via INTRAVENOUS

## 2022-06-08 MED ORDER — LABETALOL HCL 5 MG/ML IV SOLN
INTRAVENOUS | Status: AC
  Filled 2022-06-08: qty 4

## 2022-06-08 MED ORDER — ACETAMINOPHEN 325 MG PO TABS: 650.0000 mg | ORAL_TABLET | ORAL | Status: DC | PRN

## 2022-06-08 MED ORDER — GUAIFENESIN ER 600 MG PO TB12: 600.0000 mg | ORAL_TABLET | Freq: Two times a day (BID) | ORAL | Status: DC

## 2022-06-08 MED ORDER — BENZTROPINE MESYLATE 1 MG PO TABS
1.0000 mg | ORAL_TABLET | Freq: Every day | ORAL | Status: DC
  Filled 2022-06-08 (×3): qty 1

## 2022-06-08 MED ORDER — MIDODRINE HCL 5 MG PO TABS: 5.0000 mg | ORAL_TABLET | Freq: Every day | ORAL | Status: DC

## 2022-06-08 MED ORDER — MIDODRINE HCL 5 MG PO TABS: 10.0000 mg | ORAL_TABLET | ORAL | Status: DC

## 2022-06-08 MED ORDER — THROMBIN 5000 UNITS EX SOLR
CUTANEOUS | Status: AC
  Filled 2022-06-08: qty 5000

## 2022-06-08 MED ORDER — ROCURONIUM BROMIDE 10 MG/ML (PF) SYRINGE
PREFILLED_SYRINGE | INTRAVENOUS | Status: AC
  Filled 2022-06-08: qty 10

## 2022-06-08 MED ORDER — ADULT MULTIVITAMIN W/MINERALS CH: 1.0000 | ORAL_TABLET | Freq: Every day | ORAL | Status: DC

## 2022-06-08 MED ORDER — FENTANYL CITRATE (PF) 250 MCG/5ML IJ SOLN
INTRAMUSCULAR | Status: DC | PRN
  Administered 2022-06-08 (×3): 50 ug via INTRAVENOUS
  Administered 2022-06-08: 100 ug via INTRAVENOUS

## 2022-06-08 MED ORDER — ATORVASTATIN CALCIUM 10 MG PO TABS
20.0000 mg | ORAL_TABLET | Freq: Every day | ORAL | Status: DC
Start: 2022-06-08 — End: 2022-06-10

## 2022-06-08 MED ORDER — ONDANSETRON HCL 4 MG/2ML IJ SOLN
INTRAMUSCULAR | Status: AC
  Filled 2022-06-08: qty 2

## 2022-06-08 MED ORDER — CHLORHEXIDINE GLUCONATE CLOTH 2 % EX PADS
6.0000 | MEDICATED_PAD | Freq: Every day | CUTANEOUS | Status: DC
  Administered 2022-06-08 – 2022-07-02 (×21): 6 via TOPICAL

## 2022-06-08 MED ORDER — LIDOCAINE-EPINEPHRINE 1 %-1:100000 IJ SOLN
INTRAMUSCULAR | Status: DC | PRN
  Administered 2022-06-08: 20 mL via INTRADERMAL

## 2022-06-08 MED ORDER — CITALOPRAM HYDROBROMIDE 10 MG PO TABS: 20.0000 mg | ORAL_TABLET | Freq: Every day | ORAL | Status: DC

## 2022-06-08 MED ORDER — FENTANYL CITRATE (PF) 100 MCG/2ML IJ SOLN
INTRAMUSCULAR | Status: AC
  Filled 2022-06-08: qty 2

## 2022-06-08 MED ORDER — POTASSIUM CHLORIDE IN NACL 20-0.9 MEQ/L-% IV SOLN
INTRAVENOUS | Status: DC
Start: 2022-06-08 — End: 2022-06-16
  Filled 2022-06-08 (×17): qty 1000

## 2022-06-08 MED ORDER — LIDOCAINE-EPINEPHRINE 1 %-1:100000 IJ SOLN
INTRAMUSCULAR | Status: AC
  Filled 2022-06-08: qty 1

## 2022-06-08 MED ORDER — BUSPIRONE HCL 10 MG PO TABS: 5.0000 mg | ORAL_TABLET | Freq: Two times a day (BID) | ORAL | Status: DC

## 2022-06-08 MED ORDER — METOPROLOL TARTRATE 5 MG/5ML IV SOLN
INTRAVENOUS | Status: AC
  Filled 2022-06-08: qty 10

## 2022-06-08 MED ORDER — FENTANYL CITRATE (PF) 100 MCG/2ML IJ SOLN: 25.0000 ug | INTRAMUSCULAR | Status: DC | PRN

## 2022-06-08 MED ORDER — 0.9 % SODIUM CHLORIDE (POUR BTL) OPTIME
TOPICAL | Status: DC | PRN
  Administered 2022-06-08 (×2): 1000 mL

## 2022-06-08 MED ORDER — THROMBIN 20000 UNITS EX SOLR
CUTANEOUS | Status: AC
  Filled 2022-06-08: qty 20000

## 2022-06-08 MED ORDER — HEMOSTATIC AGENTS (NO CHARGE) OPTIME
TOPICAL | Status: DC | PRN
  Administered 2022-06-08: 1 via TOPICAL

## 2022-06-08 MED ORDER — LORAZEPAM 2 MG/ML IJ SOLN: 1.0000 mg | INTRAMUSCULAR | Status: DC | PRN

## 2022-06-08 MED ORDER — BENZONATATE 100 MG PO CAPS: 200.0000 mg | ORAL_CAPSULE | Freq: Three times a day (TID) | ORAL | Status: DC | PRN

## 2022-06-08 MED ORDER — THROMBIN 5000 UNITS EX SOLR
OROMUCOSAL | Status: DC | PRN
  Administered 2022-06-08: 5 mL via TOPICAL

## 2022-06-08 MED ORDER — DOCUSATE SODIUM 100 MG PO CAPS: 100.0000 mg | ORAL_CAPSULE | Freq: Two times a day (BID) | ORAL | Status: DC

## 2022-06-08 MED ORDER — RISPERIDONE 2 MG PO TABS
2.0000 mg | ORAL_TABLET | Freq: Every day | ORAL | Status: DC
  Filled 2022-06-08 (×2): qty 1

## 2022-06-08 MED ORDER — TAMSULOSIN HCL 0.4 MG PO CAPS: 0.4000 mg | ORAL_CAPSULE | Freq: Every day | ORAL | Status: DC

## 2022-06-08 MED ORDER — ROCURONIUM BROMIDE 100 MG/10ML IV SOLN
INTRAVENOUS | Status: DC | PRN
  Administered 2022-06-08: 60 mg via INTRAVENOUS

## 2022-06-08 MED ORDER — PANTOPRAZOLE SODIUM 40 MG IV SOLR
40.0000 mg | Freq: Every day | INTRAVENOUS | Status: DC
Start: 2022-06-08 — End: 2022-06-13
  Administered 2022-06-08 – 2022-06-12 (×5): 40 mg via INTRAVENOUS
  Filled 2022-06-08 (×5): qty 10

## 2022-06-08 MED ORDER — LIDOCAINE HCL (CARDIAC) PF 100 MG/5ML IV SOSY
PREFILLED_SYRINGE | INTRAVENOUS | Status: DC | PRN
  Administered 2022-06-08: 60 mg via INTRATRACHEAL

## 2022-06-08 MED ORDER — ORAL CARE MOUTH RINSE: 15.0000 mL | OROMUCOSAL | Status: DC | PRN

## 2022-06-08 MED ORDER — PROPOFOL 10 MG/ML IV BOLUS
INTRAVENOUS | Status: DC | PRN
  Administered 2022-06-08: 40 mg via INTRAVENOUS
  Administered 2022-06-08: 30 mg via INTRAVENOUS
  Administered 2022-06-08: 140 mg via INTRAVENOUS

## 2022-06-08 MED ORDER — PHENYLEPHRINE HCL (PRESSORS) 10 MG/ML IV SOLN
INTRAVENOUS | Status: DC | PRN
  Administered 2022-06-08: 40 ug via INTRAVENOUS
  Administered 2022-06-08: 80 ug via INTRAVENOUS

## 2022-06-08 MED ORDER — BACITRACIN ZINC 500 UNIT/GM EX OINT
TOPICAL_OINTMENT | CUTANEOUS | Status: AC
  Filled 2022-06-08: qty 28.35

## 2022-06-08 MED ORDER — ONDANSETRON HCL 4 MG/2ML IJ SOLN: 4.0000 mg | INTRAMUSCULAR | Status: DC | PRN

## 2022-06-08 MED ORDER — THIAMINE HCL 100 MG PO TABS: 100.0000 mg | ORAL_TABLET | Freq: Every day | ORAL | Status: DC

## 2022-06-08 MED ORDER — HYDROCODONE-ACETAMINOPHEN 5-325 MG PO TABS
1.0000 | ORAL_TABLET | ORAL | Status: DC | PRN
  Filled 2022-06-08: qty 1

## 2022-06-08 MED ORDER — CEFAZOLIN SODIUM-DEXTROSE 2-4 GM/100ML-% IV SOLN
2.0000 g | INTRAVENOUS | Status: AC
Start: 2022-06-08 — End: 2022-06-08
  Administered 2022-06-08: 2 g via INTRAVENOUS

## 2022-06-08 MED ORDER — DEXMEDETOMIDINE HCL IN NACL 400 MCG/100ML IV SOLN
INTRAVENOUS | Status: AC
  Administered 2022-06-08: 0.4 ug/kg/h via INTRAVENOUS
  Filled 2022-06-08: qty 100

## 2022-06-08 MED ORDER — THROMBIN 20000 UNITS EX SOLR
CUTANEOUS | Status: DC | PRN
  Administered 2022-06-08: 20 mL via TOPICAL

## 2022-06-08 MED ORDER — LABETALOL HCL 5 MG/ML IV SOLN
10.0000 mg | INTRAVENOUS | Status: DC | PRN
  Administered 2022-06-08: 10 mg via INTRAVENOUS
  Administered 2022-06-08 – 2022-06-10 (×5): 20 mg via INTRAVENOUS
  Administered 2022-06-10: 10 mg via INTRAVENOUS
  Administered 2022-06-12 – 2022-06-14 (×8): 20 mg via INTRAVENOUS
  Administered 2022-06-14: 40 mg via INTRAVENOUS
  Administered 2022-06-14 (×2): 20 mg via INTRAVENOUS
  Administered 2022-06-16 (×4): 40 mg via INTRAVENOUS
  Administered 2022-06-17 (×2): 20 mg via INTRAVENOUS
  Filled 2022-06-08: qty 8
  Filled 2022-06-08: qty 4
  Filled 2022-06-08: qty 8
  Filled 2022-06-08 (×2): qty 4
  Filled 2022-06-08: qty 8
  Filled 2022-06-08 (×3): qty 4
  Filled 2022-06-08 (×2): qty 8
  Filled 2022-06-08 (×5): qty 4
  Filled 2022-06-08 (×4): qty 8

## 2022-06-08 MED ORDER — DEXMEDETOMIDINE HCL IN NACL 400 MCG/100ML IV SOLN
0.4000 ug/kg/h | INTRAVENOUS | Status: DC
  Administered 2022-06-08 – 2022-06-09 (×6): 1.1 ug/kg/h via INTRAVENOUS
  Administered 2022-06-10 (×4): 1 ug/kg/h via INTRAVENOUS
  Administered 2022-06-11: 0.6 ug/kg/h via INTRAVENOUS
  Administered 2022-06-11 (×4): 1.2 ug/kg/h via INTRAVENOUS
  Administered 2022-06-12: 1 ug/kg/h via INTRAVENOUS
  Administered 2022-06-12 (×2): 0.8 ug/kg/h via INTRAVENOUS
  Administered 2022-06-13: 1 ug/kg/h via INTRAVENOUS
  Administered 2022-06-13: 0.8 ug/kg/h via INTRAVENOUS
  Administered 2022-06-13: 1.2 ug/kg/h via INTRAVENOUS
  Administered 2022-06-13: 0.3 ug/kg/h via INTRAVENOUS
  Administered 2022-06-14: 0.6 ug/kg/h via INTRAVENOUS
  Administered 2022-06-14: 0.9 ug/kg/h via INTRAVENOUS
  Administered 2022-06-14: 0.5 ug/kg/h via INTRAVENOUS
  Administered 2022-06-15: 0.9 ug/kg/h via INTRAVENOUS
  Administered 2022-06-15: 1 ug/kg/h via INTRAVENOUS
  Administered 2022-06-15: 0.6 ug/kg/h via INTRAVENOUS
  Filled 2022-06-08: qty 200
  Filled 2022-06-08 (×2): qty 100
  Filled 2022-06-08: qty 200
  Filled 2022-06-08 (×7): qty 100
  Filled 2022-06-08: qty 200
  Filled 2022-06-08 (×2): qty 100
  Filled 2022-06-08: qty 200
  Filled 2022-06-08 (×9): qty 100

## 2022-06-08 MED ORDER — LACTATED RINGERS IV SOLN: INTRAVENOUS | Status: DC | PRN

## 2022-06-08 MED ORDER — ESMOLOL HCL 100 MG/10ML IV SOLN
INTRAVENOUS | Status: DC | PRN
  Administered 2022-06-08: 30 mg via INTRAVENOUS
  Administered 2022-06-08: 10 mg via INTRAVENOUS

## 2022-06-08 SURGICAL SUPPLY — 68 items
BAG COUNTER SPONGE SURGICOUNT (BAG) ×3 IMPLANT
BAG DECANTER FOR FLEXI CONT (MISCELLANEOUS) ×2 IMPLANT
BIT DRILL WIRE PASS 1.3MM (BIT) ×1 IMPLANT
BLADE SURG 11 STRL SS (BLADE) IMPLANT
BNDG COHESIVE 4X5 TAN STRL (GAUZE/BANDAGES/DRESSINGS) IMPLANT
BNDG STRETCH 4X75 STRL LF (GAUZE/BANDAGES/DRESSINGS) IMPLANT
BUR ACORN 6.0 PRECISION (BURR) ×2 IMPLANT
BUR SPIRAL ROUTER 2.3 (BUR) ×1 IMPLANT
CANISTER SUCT 3000ML PPV (MISCELLANEOUS) ×3 IMPLANT
CARTRIDGE OIL MAESTRO DRILL (MISCELLANEOUS) ×1 IMPLANT
CLIP VESOCCLUDE MED 6/CT (CLIP) IMPLANT
COVER BACK TABLE 60X90IN (DRAPES) ×2 IMPLANT
DERMABOND ADVANCED (GAUZE/BANDAGES/DRESSINGS)
DERMABOND ADVANCED .7 DNX12 (GAUZE/BANDAGES/DRESSINGS) IMPLANT
DIFFUSER DRILL AIR PNEUMATIC (MISCELLANEOUS) ×2 IMPLANT
DRAIN CHANNEL 10M FLAT 3/4 FLT (DRAIN) ×1 IMPLANT
DRAPE MICROSCOPE LEICA (MISCELLANEOUS) IMPLANT
DRAPE NEUROLOGICAL W/INCISE (DRAPES) ×2 IMPLANT
DRAPE SURG 17X23 STRL (DRAPES) IMPLANT
DRAPE WARM FLUID 44X44 (DRAPES) ×2 IMPLANT
DRILL WIRE PASS 1.3MM (BIT)
ELECT REM PT RETURN 9FT ADLT (ELECTROSURGICAL) ×2
ELECTRODE REM PT RTRN 9FT ADLT (ELECTROSURGICAL) ×1 IMPLANT
EVACUATOR SILICONE 100CC (DRAIN) ×1 IMPLANT
GAUZE 4X4 16PLY ~~LOC~~+RFID DBL (SPONGE) IMPLANT
GAUZE SPONGE 4X4 12PLY STRL (GAUZE/BANDAGES/DRESSINGS) ×2 IMPLANT
GAUZE SPONGE 4X4 12PLY STRL LF (GAUZE/BANDAGES/DRESSINGS) ×1 IMPLANT
GLOVE BIO SURGEON STRL SZ 6.5 (GLOVE) ×2 IMPLANT
GLOVE BIO SURGEON STRL SZ7 (GLOVE) ×2 IMPLANT
GLOVE BIOGEL PI IND STRL 6.5 (GLOVE) ×1 IMPLANT
GLOVE BIOGEL PI INDICATOR 6.5 (GLOVE)
GLOVE ECLIPSE 9.0 STRL (GLOVE) ×2 IMPLANT
GLOVE EXAM NITRILE XL STR (GLOVE) IMPLANT
GOWN STRL REUS W/ TWL LRG LVL3 (GOWN DISPOSABLE) IMPLANT
GOWN STRL REUS W/ TWL XL LVL3 (GOWN DISPOSABLE) IMPLANT
GOWN STRL REUS W/TWL 2XL LVL3 (GOWN DISPOSABLE) IMPLANT
GOWN STRL REUS W/TWL LRG LVL3 (GOWN DISPOSABLE) ×2
GOWN STRL REUS W/TWL XL LVL3 (GOWN DISPOSABLE) ×2
HEMOSTAT SURGICEL 2X14 (HEMOSTASIS) ×1 IMPLANT
KIT BASIN OR (CUSTOM PROCEDURE TRAY) ×2 IMPLANT
KIT TURNOVER KIT B (KITS) ×2 IMPLANT
NDL HYPO 25X1 1.5 SAFETY (NEEDLE) ×1 IMPLANT
NEEDLE HYPO 25X1 1.5 SAFETY (NEEDLE) ×2 IMPLANT
NS IRRIG 1000ML POUR BTL (IV SOLUTION) ×2 IMPLANT
OIL CARTRIDGE MAESTRO DRILL (MISCELLANEOUS) ×2
PACK CRANIOTOMY CUSTOM (CUSTOM PROCEDURE TRAY) ×2 IMPLANT
PAD ARMBOARD 7.5X6 YLW CONV (MISCELLANEOUS) ×2 IMPLANT
PATTIES SURGICAL .25X.25 (GAUZE/BANDAGES/DRESSINGS) IMPLANT
PATTIES SURGICAL .5 X.5 (GAUZE/BANDAGES/DRESSINGS) IMPLANT
PATTIES SURGICAL .5 X3 (DISPOSABLE) IMPLANT
PATTIES SURGICAL 1X1 (DISPOSABLE) IMPLANT
PIN MAYFIELD SKULL DISP (PIN) IMPLANT
PLATE BONE 12 2H TARGET XL (Plate) ×3 IMPLANT
SCREW UNIII AXS SD 1.5X4 (Screw) ×6 IMPLANT
SPONGE NEURO XRAY DETECT 1X3 (DISPOSABLE) IMPLANT
SPONGE SURGIFOAM ABS GEL 100 (HEMOSTASIS) ×2 IMPLANT
SPONGE T-LAP 18X18 ~~LOC~~+RFID (SPONGE) IMPLANT
STAPLER VISISTAT 35W (STAPLE) ×3 IMPLANT
STOCKINETTE TUBULAR 6 INCH (GAUZE/BANDAGES/DRESSINGS) ×1 IMPLANT
SUT NURALON 4 0 TR CR/8 (SUTURE) ×4 IMPLANT
SUT VIC AB 2-0 CT2 18 VCP726D (SUTURE) ×5 IMPLANT
SUT VIC AB 3-0 SH 8-18 (SUTURE) ×1 IMPLANT
TAPE CLOTH SURG 4X10 WHT LF (GAUZE/BANDAGES/DRESSINGS) ×1 IMPLANT
TOWEL GREEN STERILE (TOWEL DISPOSABLE) ×2 IMPLANT
TOWEL GREEN STERILE FF (TOWEL DISPOSABLE) ×2 IMPLANT
TRAY FOLEY MTR SLVR 16FR STAT (SET/KITS/TRAYS/PACK) IMPLANT
UNDERPAD 30X36 HEAVY ABSORB (UNDERPADS AND DIAPERS) IMPLANT
WATER STERILE IRR 1000ML POUR (IV SOLUTION) ×2 IMPLANT

## 2022-06-08 NOTE — Anesthesia Procedure Notes (Signed)
Procedure Name: Intubation Date/Time: 06/08/2022 3:06 AM  Performed by: Claudina Lick, CRNAPre-anesthesia Checklist: Patient identified, Emergency Drugs available, Suction available and Patient being monitored Patient Re-evaluated:Patient Re-evaluated prior to induction Oxygen Delivery Method: Circle system utilized Preoxygenation: Pre-oxygenation with 100% oxygen Induction Type: IV induction, Rapid sequence and Cricoid Pressure applied Laryngoscope Size: Miller and 2 Grade View: Grade I Tube type: Oral Tube size: 8.0 mm Number of attempts: 1 Airway Equipment and Method: Stylet Placement Confirmation: ETT inserted through vocal cords under direct vision, positive ETCO2 and breath sounds checked- equal and bilateral Secured at: 23 cm Tube secured with: Tape Dental Injury: Teeth and Oropharynx as per pre-operative assessment

## 2022-06-08 NOTE — Progress Notes (Deleted)
     SUBJECTIVE:   CHIEF COMPLAINT / HPI:   Jose Li is a 66 y.o. male presents for ***   Headache  Onset: *** Location: *** Quality: *** Frequency: *** Precipitating factors: *** rior treatment: ***  Associated Symptoms Nausea/vomiting: {YES/NO/WILD CARDS:18581}  Photophobia/phonophobia: {YES/NO/WILD CARDS:18581}  Tearing of eyes: {YES/NO/WILD CARDS:18581}  Sinus pain/pressure: {YES/NO/WILD CARDS:18581}  Family hx migraine: {YES/NO/WILD EGBTD:17616}  Personal stressors: {YES/NO/WILD CARDS:18581}  Relation to menstrual cycle: {YES/NO/WILD CARDS:18581}   Red Flags Fever: {YES/NO/WILD CARDS:18581}  Neck pain/stiffness: {YES/NO/WILD CARDS:18581}  Vision/speech/swallow/hearing difficulty: {YES/NO/WILD CARDS:18581}  Focal weakness/numbness: {YES/NO/WILD CARDS:18581}  Altered mental status: {YES/NO/WILD CARDS:18581}  Trauma: {YES/NO/WILD CARDS:18581}  New type of headache: {YES/NO/WILD CARDS:18581}  Anticoagulant use: {YES/NO/WILD WVPXT:06269}  H/o cancer/HIV/Pregnancy: {YES/NO/WILD SWNIO:27035}    ***  Flowsheet Row Office Visit from 04/21/2022 in [facility]  PHQ-9 Total Score 16        Health Maintenance Due  Topic   Zoster Vaccines- Shingrix (1 of 2)   COLONOSCOPY (Pts 45-66yrs Insurance coverage will need to be confirmed)       PERTINENT  PMH / PSH:   OBJECTIVE:   There were no vitals taken for this visit.   General: Alert, no acute distress Cardio: Normal S1 and S2, RRR, no r/m/g Pulm: CTAB, normal work of breathing Abdomen: Bowel sounds normal. Abdomen soft and non-tender.  Extremities: No peripheral edema.  Neuro: Cranial nerves grossly intact   ASSESSMENT/PLAN:   No problem-specific Assessment & Plan notes found for this encounter.    Towanda Octave, MD PGY-3 [facility]

## 2022-06-08 NOTE — Anesthesia Preprocedure Evaluation (Addendum)
Anesthesia Evaluation  Patient identified by MRN, date of birth, ID band Patient confused    Reviewed: Allergy & Precautions, NPO status , Patient's Chart, lab work & pertinent test resultsPreop documentation limited or incomplete due to emergent nature of procedure.  Airway Mallampati: II  TM Distance: >3 FB     Dental   Pulmonary sleep apnea , Current Smoker,    breath sounds clear to auscultation       Cardiovascular hypertension,  Rhythm:Regular Rate:Normal     Neuro/Psych Seizures -,     GI/Hepatic negative GI ROS, Neg liver ROS,   Endo/Other    Renal/GU Renal disease     Musculoskeletal   Abdominal   Peds  Hematology   Anesthesia Other Findings   Reproductive/Obstetrics                             Anesthesia Physical Anesthesia Plan  ASA: 3  Anesthesia Plan: General   Post-op Pain Management:    Induction: Intravenous  PONV Risk Score and Plan: 2 and Ondansetron, Dexamethasone and Treatment may vary due to age or medical condition  Airway Management Planned: Oral ETT  Additional Equipment:   Intra-op Plan:   Post-operative Plan: Possible Post-op intubation/ventilation  Informed Consent: I have reviewed the patients History and Physical, chart, labs and discussed the procedure including the risks, benefits and alternatives for the proposed anesthesia with the patient or authorized representative who has indicated his/her understanding and acceptance.     Dental advisory given  Plan Discussed with: CRNA and Anesthesiologist  Anesthesia Plan Comments:         Anesthesia Quick Evaluation

## 2022-06-08 NOTE — Brief Op Note (Signed)
06/08/2022  4:12 AM  PATIENT:  [name]  66 y.o. male  PRE-OPERATIVE DIAGNOSIS:  Left Subdural Hematoma  POST-OPERATIVE DIAGNOSIS:  Left Subdural Hematoma  PROCEDURE:  Procedure(s): CRANIOTOMY HEMATOMA EVACUATION SUBDURAL (Left)  SURGEON:  Surgeon(s) and Role:    Julio Sicks, MD - Primary  PHYSICIAN ASSISTANT:   ASSISTANTS:    ANESTHESIA:   general  EBL:  150 mL   BLOOD ADMINISTERED:none  DRAINS: (46mm) Blake drain(s) in the subdural space    LOCAL MEDICATIONS USED:  MARCAINE     SPECIMEN:  No Specimen  DISPOSITION OF SPECIMEN:  N/A  COUNTS:  YES  TOURNIQUET:  * No tourniquets in log *  DICTATION: .Dragon Dictation  PLAN OF CARE: Admit to inpatient   PATIENT DISPOSITION:  PACU - hemodynamically stable.   Delay start of Pharmacological VTE agent (>24hrs) due to surgical blood loss or risk of bleeding: yes

## 2022-06-08 NOTE — Progress Notes (Signed)
Patient awake and aware.  He is restless.  He will say a few words but is recently uncooperative and somewhat agitated.  He moves both upper and lower extremities with good strength bilaterally.  Drain output is moderate.  It is thin and bloody.  Follow-up head CT scan demonstrates resolution of the patient's left convexity subdural hematoma.  He has some new cortical contusions which are likely related to the trauma that caused his subdural hematoma.  Continue supportive care and observation.  Continue subdural drain.  Watch for signs of alcohol withdrawal.  Dr. Conchita Paris to follow while I am gone for the next few days.

## 2022-06-08 NOTE — ED Notes (Signed)
Ccollar cleared, removed VRBO EDP

## 2022-06-08 NOTE — Progress Notes (Signed)
  Transition of Care [facility]) Screening Note   Patient Details  Name: Jose Li Date of Birth: 10-25-1956   Transition of Care [facility]) CM/SW Contact:    Glennon Mac, RN Phone Number: 06/08/2022, 5:19 PM    Transition of Care Department [facility]) has reviewed patient and no TOC needs have been identified at this time. We will continue to monitor patient advancement through interdisciplinary progression rounds. If new patient transition needs arise, please place a TOC consult.  Quintella Baton, RN, BSN  Trauma/Neuro ICU Case Manager [phone]

## 2022-06-08 NOTE — Progress Notes (Signed)
Dr. Jordan Likes at bedside and was updated from Lily Peer RN that patient is now oriented x1, had increased output from JP (noted in output flowsheet), MAE and pupils equal. MD aware and noted there is a scheduled CT Head at 1000. MD also confirmed to go by cuff pressures.

## 2022-06-08 NOTE — Op Note (Signed)
Date of procedure: 06/08/2022  Date of dictation: Same  Service: Neurosurgery  Preoperative diagnosis: Acute left convexity subdural hematoma, posttraumatic  Postoperative diagnosis: Same  Procedure Name: Left frontotemporoparietal craniotomy and evacuation of acute posttraumatic subdural hematoma, placement of subdural drain  Surgeon:Sophie Quiles A.Ataya Murdy, M.D.  Asst. Surgeon: None  Anesthesia: General  Indication: 66 year old male with known history of seizure disorder and alcohol abuse found semiconscious today on welfare check.  Patient transported to emergency room hemodynamically stable.  Work-up demonstrated evidence for large convexity subdural hematoma.  Patient with some bruising along his scalp.  No definite history of trauma although this is suspected.  No seizure activity.  Patient presents now for emergent craniotomy and evacuation of large convexity subdural hematoma.  Operative note: After induction of anesthesia, patient position with his head turned toward the right and appropriately padded.  Patient's left scalp prepped and draped sterilely.  A curvilinear incision was made beginning at the zygoma and extending toward midline.  Scalp flap and temporalis muscle were reflected anteriorly and held in place with towel clips and rubber bands.  A left frontotemporoparietal craniotomy was then performed using high-speed drill.  Bone flap was elevated.  Dura was then opened.  A large subdural hematoma was uncovered.  This was evacuated with gentle suction.  There was no evidence of obvious cortical bleeding.  There were 2 areas of bridging vein hemorrhage which were controlled with bipolar and Gelfoam.  The brain itself with slack but pulsatile.  A 10 mm flat Blake drain was then trimmed to an appropriate length and exited through a separate stab incision.  Dura was loosely reapproximated.  Gelfoam was placed over the dural repair.  Bone flap was secured with cranial plates.  Temporalis fascia  and scalp were closed with 2-0 Vicryl sutures.  Staples were applied to the surface.  No apparent complications.  Patient tolerated procedure well.  He returns to the recovery room postop.

## 2022-06-08 NOTE — ED Notes (Signed)
Assumed care of patient. Patient is verbal alert and oriented. Patient states he is in pain 10/10.

## 2022-06-08 NOTE — Transfer of Care (Signed)
Immediate Anesthesia Transfer of Care Note  Patient: Jose Li  Procedure(s) Performed: CRANIOTOMY HEMATOMA EVACUATION SUBDURAL (Left: Head)  Patient Location: PACU  Anesthesia Type:General  Level of Consciousness: drowsy  Airway & Oxygen Therapy: Patient Spontanous Breathing and Patient connected to face mask oxygen  Post-op Assessment: Report given to RN and Post -op Vital signs reviewed and stable  Post vital signs: Reviewed and stable  Last Vitals:  Vitals Value Taken Time  BP 133/80 06/08/22 0415  Temp    Pulse 81 06/08/22 0419  Resp 22 06/08/22 0419  SpO2 100 % 06/08/22 0419  Vitals shown include unvalidated device data.  Last Pain:  Vitals:   06/08/22 0207  TempSrc: Oral  PainSc:          Complications: No notable events documented.

## 2022-06-08 NOTE — Anesthesia Postprocedure Evaluation (Signed)
Anesthesia Post Note  Patient: KEMPTON MILNE  Procedure(s) Performed: CRANIOTOMY HEMATOMA EVACUATION SUBDURAL (Left: Head)     Patient location during evaluation: PACU Anesthesia Type: General Level of consciousness: awake Pain management: pain level controlled Vital Signs Assessment: post-procedure vital signs reviewed and stable Respiratory status: spontaneous breathing Cardiovascular status: stable Postop Assessment: no apparent nausea or vomiting Anesthetic complications: no   No notable events documented.  Last Vitals:  Vitals:   06/08/22 0415 06/08/22 0430  BP: 133/80 130/78  Pulse: 79 84  Resp: 19 18  Temp: (!) 36.4 C   SpO2: 100% 100%    Last Pain:  Vitals:   06/08/22 0415  TempSrc:   PainSc: 0-No pain                 Joeangel Jeanpaul

## 2022-06-08 NOTE — H&P (Cosign Needed Addendum)
Jose Li is an 66 y.o. male.   Chief Complaint: Subdural hematoma HPI: 66 year old male found on ground at home during wellness check.  Patient with known seizure disorder.  Patient also with known history of alcohol disorder.  He had not been seen for some time.  When he was checked on he was found lying face down on the ground semiconscious.  Patient transferred to emergency room for evaluation.  Work-up at that time demonstrated evidence of a large acute hemispheric subdural hematoma on the left with mass effect.  The patient has awakened.  He complains of headaches.  He is amnestic to the incidents surrounding his situation.  He has been hemodynamically stable throughout.  There is no documented hypotension or hypoxia. He has been given a loading dose of antiepileptic Keppra. Past Medical History:  Diagnosis Date   Acute kidney injury (HCC) 05/26/2018   Allergy    Anemia    Anxiety    Depression    Hyperlipidemia    Hypertension, essential, benign 08/02/2012   Orthostatic hypotension 10/09/2013   OSA (obstructive sleep apnea) 10/01/2015   Severe OSA with an AHI of 86/hr now on BiPAP at 23/19cm H2O   Seizures (HCC)    none for last 6-80months   Substance abuse [facility])     Past Surgical History:  Procedure Laterality Date   FRACTURE SURGERY  1990   right hip and femur   HERNIA REPAIR  07/2009   umbilical hernia/ got infected   JOINT REPLACEMENT Right 1990   right hip   TRACHEOSTOMY  08/1999   Had pneumoniaand was in a coma 10 days    Family History  Problem Relation Age of Onset   Heart disease Mother    Diabetes Father    Heart disease Brother    Hypertension Neg Hx    Stroke Neg Hx    Social History:  reports that he has been smoking cigarettes. He has a 30.00 pack-year smoking history. He has been exposed to tobacco smoke. He has never used smokeless tobacco. He reports that he does not currently use drugs. He reports that he does not drink alcohol.  Allergies:   Allergies  Allergen Reactions   Duloxetine Other (See Comments)    Caused hyponatremia   Erythromycin Other (See Comments)    Unknown reaction. The patient can not remember   Hctz [Hydrochlorothiazide]     "Makes him faint" per patient Dr. Tenny Craw ([place]) took him off   Propranolol     "Makes him faint" per patient Dr. Tenny Craw ([place]) took him off      (Not in a hospital admission)   Results for orders placed or performed during the hospital encounter of 06/07/22 (from the past 48 hour(s))  Urinalysis, Routine w reflex microscopic Urine, Clean Catch     Status: Abnormal   Collection Time: 06/07/22  8:12 PM  Result Value Ref Range   Color, Urine YELLOW YELLOW   APPearance CLEAR CLEAR   Specific Gravity, Urine 1.016 1.005 - 1.030   pH 5.0 5.0 - 8.0   Glucose, UA NEGATIVE NEGATIVE mg/dL   Hgb urine dipstick NEGATIVE NEGATIVE   Bilirubin Urine NEGATIVE NEGATIVE   Ketones, ur 5 (A) NEGATIVE mg/dL   Protein, ur NEGATIVE NEGATIVE mg/dL   Nitrite NEGATIVE NEGATIVE   Leukocytes,Ua NEGATIVE NEGATIVE    Comment: Performed at [facility], [address] [place], [place] [address]  Rapid urine drug screen (hospital performed)     Status: None  Collection Time: 06/07/22  8:12 PM  Result Value Ref Range   Opiates NONE DETECTED NONE DETECTED   Cocaine NONE DETECTED NONE DETECTED   Benzodiazepines NONE DETECTED NONE DETECTED   Amphetamines NONE DETECTED NONE DETECTED   Tetrahydrocannabinol NONE DETECTED NONE DETECTED   Barbiturates NONE DETECTED NONE DETECTED    Comment: (NOTE) DRUG SCREEN FOR MEDICAL PURPOSES ONLY.  IF CONFIRMATION IS NEEDED FOR ANY PURPOSE, NOTIFY LAB WITHIN 5 DAYS.  LOWEST DETECTABLE LIMITS FOR URINE DRUG SCREEN Drug Class                     Cutoff (ng/mL) Amphetamine and metabolites    1000 Barbiturate and metabolites    200 Benzodiazepine                 200 Tricyclics and metabolites     300 Opiates and metabolites        300 Cocaine and  metabolites        300 THC                            50 Performed at [facility], [address] [place], [place] [address]   Lactic acid, plasma     Status: None   Collection Time: 06/07/22  8:45 PM  Result Value Ref Range   Lactic Acid, Venous 1.2 0.5 - 1.9 mmol/L    Comment: Performed at [facility], [address] [place], [place] [address]  Comprehensive metabolic panel     Status: Abnormal   Collection Time: 06/07/22  8:45 PM  Result Value Ref Range   Sodium 133 (L) 135 - 145 mmol/L   Potassium 4.6 3.5 - 5.1 mmol/L   Chloride 103 98 - 111 mmol/L   CO2 19 (L) 22 - 32 mmol/L   Glucose, Bld 117 (H) 70 - 99 mg/dL    Comment: Glucose reference range applies only to samples taken after fasting for at least 8 hours.   BUN 7 (L) 8 - 23 mg/dL   Creatinine, Ser 0.981.05 0.61 - 1.24 mg/dL   Calcium 8.7 (L) 8.9 - 10.3 mg/dL   Total Protein 6.3 (L) 6.5 - 8.1 g/dL   Albumin 3.9 3.5 - 5.0 g/dL   AST 39 15 - 41 U/L   ALT 22 0 - 44 U/L   Alkaline Phosphatase 63 38 - 126 U/L   Total Bilirubin 1.1 0.3 - 1.2 mg/dL   GFR, Estimated >11>60 >91>60 mL/min    Comment: (NOTE) Calculated using the CKD-EPI Creatinine Equation (2021)    Anion gap 11 5 - 15    Comment: Performed at [facility], [address] [place], [place] [address]  CBC with Differential     Status: Abnormal   Collection Time: 06/07/22  8:45 PM  Result Value Ref Range   WBC 18.8 (H) 4.0 - 10.5 K/uL   RBC 4.09 (L) 4.22 - 5.81 MIL/uL   Hemoglobin 13.3 13.0 - 17.0 g/dL   HCT 56.239.1 13.039.0 - 86.552.0 %   MCV 95.6 80.0 - 100.0 fL   MCH 32.5 26.0 - 34.0 pg   MCHC 34.0 30.0 - 36.0 g/dL   RDW 78.412.6 69.611.5 - 29.515.5 %   Platelets 320 150 - 400 K/uL   nRBC 0.0 0.0 - 0.2 %   Neutrophils Relative % 88 %   Neutro Abs 16.6 (H) 1.7 - 7.7 K/uL   Lymphocytes  Relative 2 %   Lymphs Abs 0.3 (L) 0.7 - 4.0 K/uL   Monocytes Relative 9 %   Monocytes Absolute 1.7 (H) 0.1 - 1.0 K/uL   Eosinophils Relative 0 %   Eosinophils Absolute 0.0 0.0  - 0.5 K/uL   Basophils Relative 0 %   Basophils Absolute 0.1 0.0 - 0.1 K/uL   Immature Granulocytes 1 %   Abs Immature Granulocytes 0.10 (H) 0.00 - 0.07 K/uL    Comment: Performed at [facility], [address], [place], [place] [address]  Protime-INR     Status: None   Collection Time: 06/07/22  8:45 PM  Result Value Ref Range   Prothrombin Time 13.2 11.4 - 15.2 seconds   INR 1.0 0.8 - 1.2    Comment: (NOTE) INR goal varies based on device and disease states. Performed at [facility], [address] [place], [place] [address]   APTT     Status: None   Collection Time: 06/07/22  8:45 PM  Result Value Ref Range   aPTT 25 24 - 36 seconds    Comment: Performed at [facility], [address] [place], [place] [address]  Ethanol     Status: None   Collection Time: 06/07/22  8:45 PM  Result Value Ref Range   Alcohol, Ethyl (B) <10 <10 mg/dL    Comment: (NOTE) Lowest detectable limit for serum alcohol is 10 mg/dL.  For medical purposes only. Performed at [facility], [address], [place], [place] [address]    CT Head Wo Contrast  Result Date: 06/08/2022 CLINICAL DATA:  Initial evaluation for acute trauma. EXAM: CT HEAD WITHOUT CONTRAST CT CERVICAL SPINE WITHOUT CONTRAST TECHNIQUE: Multidetector CT imaging of the head and cervical spine was performed following the standard protocol without intravenous contrast. Multiplanar CT image reconstructions of the cervical spine were also generated. RADIATION DOSE REDUCTION: This exam was performed according to the departmental dose-optimization program which includes automated exposure control, adjustment of the mA and/or kV according to patient size and/or use of iterative reconstruction technique. COMPARISON:  Prior study from 02/05/2016. FINDINGS: CT HEAD FINDINGS Brain: Age-related cerebral atrophy. Large holo hemispheric subdural hemorrhage overlying the left cerebral convexity is seen. Hemorrhage measures up to  12 mm in thickness. Associated parafalcine component measuring up to 4 mm noted. Associated mass effect on the subjacent left cerebral hemisphere with up to 1 cm of left-to-right shift. Partial effacement of the left lateral ventricle. No hydrocephalus or trapping. Trace hemorrhage noted layering within the occipital horn of the right lateral ventricle, which could be related to redistribution. No acute large vessel territory infarct.  No visible mass lesion. Vascular: No hyperdense vessel. Scattered vascular calcifications noted within the carotid siphons. Skull: Soft tissue contusion noted involving the left periorbital region. Calvarium intact without fracture. Sinuses/Orbits: Globes and orbital soft tissues demonstrate no acute finding. Scattered mucosal thickening noted about the ethmoidal air cells and maxillary sinuses. No significant mastoid effusion. Other: None. CT CERVICAL SPINE FINDINGS Alignment: Straightening of the normal cervical lordosis. No listhesis or malalignment. Skull base and vertebrae: Skull base intact. Normal C1-2 articulations are preserved in the dens is intact. Vertebral body heights maintained. No acute fracture. Soft tissues and spinal canal: Soft tissues of the neck demonstrate no acute finding. No abnormal prevertebral edema. Spinal canal within normal limits. Disc levels: C5 through C7 vertebral bodies are largely ankylosed. Prior left posterior decompression at C5-6 noted. Upper chest: Visualized upper chest demonstrates  no acute finding. 4 mm nodule noted at the left lung apex, indeterminate. Other: None. IMPRESSION: CT BRAIN: 1. Acute holohemispheric subdural hemorrhage overlying the left cerebral convexity with associated left parafalcine component. Associated mass effect on the subjacent left cerebral hemisphere with up to 1 cm of left-to-right shift. No hydrocephalus or trapping. 2. Trace hemorrhage layering within the occipital horn of the right lateral ventricle, which  could be related to redistribution. 3. Soft tissue contusion involving the left periorbital region. No calvarial fracture. CT CERVICAL SPINE: No acute traumatic injury within the cervical spine. Critical Value/emergent results were called by telephone at the time of interpretation on 06/08/2022 at 12:43 am to provider Sharilyn Sites, who verbally acknowledged these results. Electronically Signed   By: Rise Mu M.D.   On: 06/08/2022 00:48   CT Cervical Spine Wo Contrast  Result Date: 06/08/2022 CLINICAL DATA:  Initial evaluation for acute trauma. EXAM: CT HEAD WITHOUT CONTRAST CT CERVICAL SPINE WITHOUT CONTRAST TECHNIQUE: Multidetector CT imaging of the head and cervical spine was performed following the standard protocol without intravenous contrast. Multiplanar CT image reconstructions of the cervical spine were also generated. RADIATION DOSE REDUCTION: This exam was performed according to the departmental dose-optimization program which includes automated exposure control, adjustment of the mA and/or kV according to patient size and/or use of iterative reconstruction technique. COMPARISON:  Prior study from 02/05/2016. FINDINGS: CT HEAD FINDINGS Brain: Age-related cerebral atrophy. Large holo hemispheric subdural hemorrhage overlying the left cerebral convexity is seen. Hemorrhage measures up to 12 mm in thickness. Associated parafalcine component measuring up to 4 mm noted. Associated mass effect on the subjacent left cerebral hemisphere with up to 1 cm of left-to-right shift. Partial effacement of the left lateral ventricle. No hydrocephalus or trapping. Trace hemorrhage noted layering within the occipital horn of the right lateral ventricle, which could be related to redistribution. No acute large vessel territory infarct.  No visible mass lesion. Vascular: No hyperdense vessel. Scattered vascular calcifications noted within the carotid siphons. Skull: Soft tissue contusion noted involving the left  periorbital region. Calvarium intact without fracture. Sinuses/Orbits: Globes and orbital soft tissues demonstrate no acute finding. Scattered mucosal thickening noted about the ethmoidal air cells and maxillary sinuses. No significant mastoid effusion. Other: None. CT CERVICAL SPINE FINDINGS Alignment: Straightening of the normal cervical lordosis. No listhesis or malalignment. Skull base and vertebrae: Skull base intact. Normal C1-2 articulations are preserved in the dens is intact. Vertebral body heights maintained. No acute fracture. Soft tissues and spinal canal: Soft tissues of the neck demonstrate no acute finding. No abnormal prevertebral edema. Spinal canal within normal limits. Disc levels: C5 through C7 vertebral bodies are largely ankylosed. Prior left posterior decompression at C5-6 noted. Upper chest: Visualized upper chest demonstrates no acute finding. 4 mm nodule noted at the left lung apex, indeterminate. Other: None. IMPRESSION: CT BRAIN: 1. Acute holohemispheric subdural hemorrhage overlying the left cerebral convexity with associated left parafalcine component. Associated mass effect on the subjacent left cerebral hemisphere with up to 1 cm of left-to-right shift. No hydrocephalus or trapping. 2. Trace hemorrhage layering within the occipital horn of the right lateral ventricle, which could be related to redistribution. 3. Soft tissue contusion involving the left periorbital region. No calvarial fracture. CT CERVICAL SPINE: No acute traumatic injury within the cervical spine. Critical Value/emergent results were called by telephone at the time of interpretation on 06/08/2022 at 12:43 am to provider Sharilyn Sites, who verbally acknowledged these results. Electronically Signed   By: Sharlet Salina  Phill Myron M.D.   On: 06/08/2022 00:48   DG Shoulder Left  Result Date: 06/07/2022 CLINICAL DATA:  Status post fall. EXAM: LEFT SHOULDER - 2+ VIEW COMPARISON:  None Available. FINDINGS: There is no evidence  of an acute fracture or dislocation. A chronic sixth left rib fracture is seen. There is no evidence of arthropathy or other focal bone abnormality. Soft tissues are unremarkable. IMPRESSION: No acute osseous abnormality. Electronically Signed   By: Aram Candela M.D.   On: 06/07/2022 22:05   DG Chest Port 1 View  Result Date: 06/07/2022 CLINICAL DATA:  Questionable sepsis.  Headache. EXAM: PORTABLE CHEST 1 VIEW COMPARISON:  02/05/2016 FINDINGS: Shallow inspiration. Mild cardiac enlargement. No vascular congestion, edema, or consolidation. No pleural effusions. No pneumothorax. Mediastinal contours appear intact. Old bilateral rib fractures. IMPRESSION: Cardiac enlargement.  No evidence of active pulmonary disease. Electronically Signed   By: Burman Nieves M.D.   On: 06/07/2022 21:01    Review of systems not obtained due to patient factors.  Blood pressure (!) 150/72, pulse 99, temperature 97.7 F (36.5 C), temperature source Temporal, resp. rate 17, SpO2 100 %.  Patient is somnolent but will awaken to voice.  He answers simple questions.  He is oriented to person place and time.  His speech is slow but his content is reasonably fluent.  Cranial nerve function finds his pupils to be equal round reactive to light bilaterally.  Extraocular movements are full.  Facial movement and sensation are normal bilateral.  Motor examination 5/5 bilaterally.  Sensory examination nonfocal.  Examination of his calvarium demonstrates some scattered bruising but no evidence of laceration or major abnormality.  Oropharynx, nasopharynx and external auditory canals are clear.  Chest and abdomen are benign.  Extremities are free from injury or deformity. Assessment/Plan Large acute left posttraumatic subdural hematoma with mass effect and subfalcine herniation and mild transtentorial herniation.  I discussed situation with the patient.  Recommended we move forward with a left-sided frontotemporoparietal craniotomy and  evacuation of hematoma.  I discussed risks and benefits.  Patient wishes to proceed.  Sherilyn Cooter A Pool 06/08/2022, 1:38 AM

## 2022-06-08 NOTE — Anesthesia Procedure Notes (Signed)
Arterial Line Insertion Start/End7/25/2023 2:30 AM, 06/08/2022 2:45 AM Performed by: Claudina Lick, CRNA, CRNA  Patient location: Pre-op. Preanesthetic checklist: patient identified, IV checked, site marked, risks and benefits discussed, surgical consent, monitors and equipment checked, pre-op evaluation, timeout performed and anesthesia consent Emergency situation Right, radial was placed Catheter size: 20 G Hand hygiene performed  and maximum sterile barriers used   Attempts: 2 Procedure performed without using ultrasound guided technique. Ultrasound Notes:anatomy identified, needle tip was noted to be adjacent to the nerve/plexus identified and no ultrasound evidence of intravascular and/or intraneural injection Following insertion, dressing applied and Biopatch. Post procedure assessment: normal and unchanged  Patient tolerated the procedure well with no immediate complications.

## 2022-06-08 NOTE — ED Provider Notes (Signed)
  Assumed care at shift change from prior team.  See notes for full H&P.  Briefly, 66 y.o. M presenitng to the ED after being found down in the floor.  Suspected seizure-- hx of same with medication non-compliance.  Does have hematoma to head.  Work up thus far reassuring.  Was given IV keppra load.    Plan:  ct head/neck pending.  If negative and patient ambulatory, likely discharge.  12:48 AM Notified by radiology, Dr. Phill Myron-- patient with large left sided subdural, 1cm midline shift.  VS remain stable, mentating currently but is complaining of worsening headache.  Will discuss with neurosurgery.  1:08 AM Spoke with Dr. Jordan Likes-- will come evaluate patient in the ED.  1:48 AM Dr. Jordan Likes has evaluated in the ED-- plan for OR.  CRITICAL CARE Performed by: Garlon Hatchet   Total critical care time: 45 minutes  Critical care time was exclusive of separately billable procedures and treating other patients.  Critical care was necessary to treat or prevent imminent or life-threatening deterioration.  Critical care was time spent personally by me on the following activities: development of treatment plan with patient and/or surrogate as well as nursing, discussions with consultants, evaluation of patient's response to treatment, examination of patient, obtaining history from patient or surrogate, ordering and performing treatments and interventions, ordering and review of laboratory studies, ordering and review of radiographic studies, pulse oximetry and re-evaluation of patient's condition.    Garlon Hatchet, PA-C 06/08/22 0426    Gilda Crease, MD 06/08/22 [phone]

## 2022-06-09 NOTE — Progress Notes (Signed)
  NEUROSURGERY PROGRESS NOTE   Pt agitated overnight requiring restraints. Pulled out Foley today  EXAM:  BP (!) 150/64   Pulse 78   Temp 98.6 F (37 C) (Oral)   Resp 20   Wt 74 kg   SpO2 97%   BMI 22.13 kg/m   Opens eyes to voice Will respond with "yes," or "no," although not entirely appropriate Will intermittently FC BUE/BLE, seems symmetric Wound c/d/I, JP in place >100cc output  IMPRESSION:  66 y.o. male POD#2 s/p left crani for SDH, neurologically stable to improved - Hx of EtOH abuse  PLAN: - Cont to monitor in ICU - Plan on repeat [facility] tomorrow, can then consider d/c JP - CIWA protocol   Lisbeth Renshaw, MD [facility]

## 2022-06-09 NOTE — Progress Notes (Signed)
Pt pulled out f/c with balloon intact. Pt oozing blood at meatus. Dr. Conchita Paris notified.

## 2022-06-10 ENCOUNTER — Inpatient Hospital Stay (HOSPITAL_COMMUNITY): Payer: Medicare Other

## 2022-06-10 DIAGNOSIS — E440 Moderate protein-calorie malnutrition: Secondary | ICD-10-CM | POA: Insufficient documentation

## 2022-06-10 LAB — GLUCOSE, CAPILLARY
Glucose-Capillary: 128 mg/dL — ABNORMAL HIGH (ref 70–99)
Glucose-Capillary: 153 mg/dL — ABNORMAL HIGH (ref 70–99)
Glucose-Capillary: 146 mg/dL — ABNORMAL HIGH (ref 70–99)

## 2022-06-10 LAB — MAGNESIUM
Magnesium: 1.9 mg/dL (ref 1.7–2.4)
Magnesium: 1.8 mg/dL (ref 1.7–2.4)

## 2022-06-10 LAB — PHOSPHORUS
Phosphorus: 2.7 mg/dL (ref 2.5–4.6)
Phosphorus: 2.8 mg/dL (ref 2.5–4.6)

## 2022-06-10 MED ORDER — BUSPIRONE HCL 10 MG PO TABS
5.0000 mg | ORAL_TABLET | Freq: Two times a day (BID) | ORAL | Status: DC
  Administered 2022-06-10 – 2022-06-21 (×22): 5 mg
  Filled 2022-06-10 (×21): qty 1

## 2022-06-10 MED ORDER — CITALOPRAM HYDROBROMIDE 10 MG PO TABS
20.0000 mg | ORAL_TABLET | Freq: Every day | ORAL | Status: DC
Start: 2022-06-10 — End: 2022-06-21
  Administered 2022-06-10 – 2022-06-21 (×12): 20 mg
  Filled 2022-06-10 (×12): qty 2

## 2022-06-10 MED ORDER — POTASSIUM CHLORIDE 20 MEQ PO PACK
20.0000 meq | PACK | Freq: Every day | ORAL | Status: DC
  Administered 2022-06-10 – 2022-06-21 (×12): 20 meq
  Filled 2022-06-10 (×12): qty 1

## 2022-06-10 MED ORDER — TRAZODONE HCL 50 MG PO TABS
150.0000 mg | ORAL_TABLET | Freq: Every day | ORAL | Status: DC
  Administered 2022-06-10 – 2022-06-20 (×10): 150 mg
  Filled 2022-06-10 (×2): qty 1
  Filled 2022-06-10 (×3): qty 3
  Filled 2022-06-10: qty 1
  Filled 2022-06-10: qty 3
  Filled 2022-06-10: qty 1
  Filled 2022-06-10 (×2): qty 3

## 2022-06-10 MED ORDER — MIDODRINE HCL 5 MG PO TABS
10.0000 mg | ORAL_TABLET | ORAL | Status: DC
Start: 2022-06-10 — End: 2022-06-13
  Administered 2022-06-11 – 2022-06-13 (×3): 10 mg
  Filled 2022-06-10 (×3): qty 2

## 2022-06-10 MED ORDER — GUAIFENESIN 100 MG/5ML PO LIQD
10.0000 mL | Freq: Four times a day (QID) | ORAL | Status: DC
  Administered 2022-06-10 – 2022-06-21 (×42): 10 mL
  Filled 2022-06-10 (×3): qty 10
  Filled 2022-06-10 (×3): qty 15
  Filled 2022-06-10: qty 10
  Filled 2022-06-10 (×4): qty 15
  Filled 2022-06-10: qty 10
  Filled 2022-06-10: qty 15
  Filled 2022-06-10: qty 10
  Filled 2022-06-10 (×5): qty 15
  Filled 2022-06-10: qty 10
  Filled 2022-06-10: qty 15
  Filled 2022-06-10 (×2): qty 10
  Filled 2022-06-10 (×3): qty 15
  Filled 2022-06-10 (×4): qty 10
  Filled 2022-06-10 (×2): qty 15
  Filled 2022-06-10: qty 10
  Filled 2022-06-10: qty 15
  Filled 2022-06-10: qty 10
  Filled 2022-06-10 (×2): qty 15

## 2022-06-10 MED ORDER — ACETAMINOPHEN 325 MG PO TABS
650.0000 mg | ORAL_TABLET | ORAL | Status: DC | PRN
  Administered 2022-06-12 – 2022-06-21 (×9): 650 mg
  Filled 2022-06-10 (×10): qty 2

## 2022-06-10 MED ORDER — ACETAMINOPHEN 650 MG RE SUPP: 650.0000 mg | RECTAL | Status: DC | PRN

## 2022-06-10 MED ORDER — ADULT MULTIVITAMIN LIQUID CH
15.0000 mL | Freq: Every day | ORAL | Status: DC
  Administered 2022-06-10 – 2022-06-15 (×6): 15 mL
  Filled 2022-06-10 (×6): qty 15

## 2022-06-10 MED ORDER — PROSOURCE TF PO LIQD
45.0000 mL | Freq: Two times a day (BID) | ORAL | Status: DC
  Administered 2022-06-10 – 2022-06-21 (×22): 45 mL
  Filled 2022-06-10 (×20): qty 45

## 2022-06-10 MED ORDER — ONDANSETRON HCL 4 MG PO TABS: 4.0000 mg | ORAL_TABLET | ORAL | Status: DC | PRN

## 2022-06-10 MED ORDER — OSMOLITE 1.5 CAL PO LIQD
1000.0000 mL | ORAL | Status: DC
  Administered 2022-06-10 – 2022-06-20 (×10): 1000 mL
  Filled 2022-06-10 (×2): qty 1000

## 2022-06-10 MED ORDER — DOXAZOSIN MESYLATE 1 MG PO TABS
1.0000 mg | ORAL_TABLET | Freq: Every day | ORAL | Status: DC
  Administered 2022-06-10 – 2022-06-21 (×12): 1 mg
  Filled 2022-06-10 (×12): qty 1

## 2022-06-10 MED ORDER — THIAMINE HCL 100 MG PO TABS
100.0000 mg | ORAL_TABLET | Freq: Every day | ORAL | Status: DC
  Administered 2022-06-10 – 2022-06-21 (×11): 100 mg
  Filled 2022-06-10 (×12): qty 1

## 2022-06-10 MED ORDER — ATORVASTATIN CALCIUM 10 MG PO TABS
20.0000 mg | ORAL_TABLET | Freq: Every day | ORAL | Status: DC
Start: 2022-06-10 — End: 2022-06-21
  Administered 2022-06-10 – 2022-06-21 (×12): 20 mg
  Filled 2022-06-10 (×12): qty 2

## 2022-06-10 MED ORDER — MIDODRINE HCL 5 MG PO TABS
5.0000 mg | ORAL_TABLET | Freq: Every day | ORAL | Status: DC
  Filled 2022-06-10: qty 1

## 2022-06-10 MED ORDER — BENZTROPINE MESYLATE 1 MG PO TABS
1.0000 mg | ORAL_TABLET | Freq: Every day | ORAL | Status: DC
  Administered 2022-06-10 – 2022-06-21 (×12): 1 mg
  Filled 2022-06-10 (×12): qty 1

## 2022-06-10 MED ORDER — ONDANSETRON HCL 4 MG/2ML IJ SOLN: 4.0000 mg | INTRAMUSCULAR | Status: DC | PRN

## 2022-06-10 MED ORDER — PROMETHAZINE HCL 25 MG PO TABS: 12.5000 mg | ORAL_TABLET | ORAL | Status: DC | PRN

## 2022-06-10 MED ORDER — RISPERIDONE 0.5 MG PO TABS
2.0000 mg | ORAL_TABLET | Freq: Every day | ORAL | Status: DC
  Administered 2022-06-10 – 2022-06-20 (×10): 2 mg
  Filled 2022-06-10 (×2): qty 1
  Filled 2022-06-10: qty 4
  Filled 2022-06-10: qty 1
  Filled 2022-06-10 (×2): qty 4
  Filled 2022-06-10 (×4): qty 1
  Filled 2022-06-10: qty 4

## 2022-06-10 MED ORDER — DOCUSATE SODIUM 50 MG/5ML PO LIQD
100.0000 mg | Freq: Two times a day (BID) | ORAL | Status: DC
Start: 2022-06-10 — End: 2022-06-21
  Administered 2022-06-10 – 2022-06-21 (×17): 100 mg
  Filled 2022-06-10 (×19): qty 10

## 2022-06-10 MED ORDER — HYDROCODONE-ACETAMINOPHEN 5-325 MG PO TABS
1.0000 | ORAL_TABLET | ORAL | Status: DC | PRN
  Administered 2022-06-10 – 2022-06-21 (×28): 1
  Filled 2022-06-10 (×31): qty 1

## 2022-06-10 MED ORDER — PREGABALIN 50 MG PO CAPS
75.0000 mg | ORAL_CAPSULE | Freq: Two times a day (BID) | ORAL | Status: DC
  Administered 2022-06-10 – 2022-06-21 (×21): 75 mg
  Filled 2022-06-10 (×21): qty 1

## 2022-06-10 NOTE — Progress Notes (Signed)
Per MD SBP < 165

## 2022-06-10 NOTE — Procedures (Signed)
Cortrak  Person Inserting Tube:  Isabela Nardelli D, RD Tube Type:  Cortrak - 43 inches Tube Size:  10 Tube Location:  Left nare Secured by: Bridle Technique Used to Measure Tube Placement:  Marking at nare/corner of mouth Cortrak Secured At:  69 cm  Cortrak Tube Team Note:  Consult received to place a Cortrak feeding tube.   X-ray is required, abdominal x-ray has been ordered by the Cortrak team. Please confirm tube placement before using the Cortrak tube.   If the tube becomes dislodged please keep the tube and contact the Cortrak team at [email or web address] (password [email or web address]) for replacement.  If after hours and replacement cannot be delayed, place a NG tube and confirm placement with an abdominal x-ray.    Nels Munn, RD, LDN Clinical Dietitian RD pager # available in AMION  After hours/weekend pager # available in AMION  

## 2022-06-10 NOTE — Progress Notes (Signed)
Initial Nutrition Assessment  DOCUMENTATION CODES:   Non-severe (moderate) malnutrition in context of chronic illness  INTERVENTION:   Tube Feeding via Cortrak:  Osmolite 1.5 at 55 ml/hr Given risk for refeeding, plan to start TF at 20 ml/hr and titrate by 10 mL q 8 hours until goal rate 55 Pro-Source TF 45 mL BID This provides 105 g of protein, 2060 kcals, 1003 mL of free water  Monitor magnesium, potassium, and phosphorus BID for at least 3 days, MD to replete as needed, as pt is at risk for refeeding syndrome given malnutrition, EtOH abuse.  Thiamine 100 mg daily for at least 7 days   NUTRITION DIAGNOSIS:   Moderate Malnutrition related to social / environmental circumstances as evidenced by moderate muscle depletion, severe muscle depletion, moderate fat depletion.   GOAL:   Patient will meet greater than or equal to 90% of their needs   MONITOR:   Labs, Weight trends, TF tolerance  REASON FOR ASSESSMENT:   Other (Comment) (NPO, Cortrak order) Enteral/tube feeding initiation and management  ASSESSMENT:   66 yo male admitted with L SDH requiring craniotomy with hematoma evacuation. PMH includes EtOH abuse, seizure disorder, HTN, HLD, depression, anxiety. Surgial hx includes trach s/p removal, hernia repair and R hip and femur fx  7/25  Left frontotemporoparietal craniotomy and evacuation of acute posttraumatic subdural hematoma, placement of subdural drain   Noted plan for repeat [facility] tomorrow with possible consideration of JP removal  NPO, plan for Cortrak today Agitated overnight, pulled out Foley, required restraints. Currently on precedex gtt, confused. Unable to obtain diet and weight history from patient at this time  Noted Urine Tox Screen negative  Labs: sodium 132 (L), Creatinine wdl, CBG 172, serum glucose 184 Meds: NS with KCL at 100 ml/hr, MVI with Minerals, thiamine   NUTRITION - FOCUSED PHYSICAL EXAM:  Flowsheet Row Most Recent Value  Upper  Arm Region Mild depletion  Thoracic and Lumbar Region Moderate depletion  Buccal Region Moderate depletion  Temple Region Moderate depletion  Clavicle Bone Region Moderate depletion  Clavicle and Acromion Bone Region Moderate depletion  Scapular Bone Region Moderate depletion  Dorsal Hand Unable to assess  Patellar Region Severe depletion  Anterior Thigh Region Severe depletion  Posterior Calf Region Severe depletion  Mouth --  [pale tongue, edentulous (wears dentures but not in currently)]  Skin --  [ecchymosis]       Diet Order:   Diet Order             Diet NPO time specified  Diet effective now                   EDUCATION NEEDS:   Not appropriate for education at this time  Skin:  Skin Assessment: Skin Integrity Issues: Skin Integrity Issues:: Incisions Incisions: head-post craniotomy  Last BM:  PTA  Height:   Ht Readings from Last 1 Encounters:  04/21/22 6' (1.829 m)    Weight:   Wt Readings from Last 1 Encounters:  06/08/22 74 kg   BMI:  Body mass index is 22.13 kg/m.  Estimated Nutritional Needs:   Kcal:  2000-2200 kcals  Protein:  100-115 g  Fluid:  >/= 2 L   Romelle Starcher MS, RDN, LDN, CNSC Registered Dietitian 3 Clinical Nutrition RD Pager and On-Call Pager Number Located in [place]

## 2022-06-10 NOTE — Progress Notes (Signed)
  NEUROSURGERY PROGRESS NOTE   No issues overnight.  EXAM:  BP (!) 144/67   Pulse (!) 52   Temp 97.6 F (36.4 C) (Oral)   Resp (!) 24   Wt 74 kg   SpO2 98%   BMI 22.13 kg/m   More responsive today, Answers simple questions Oriented to name, hospital not year Follows commands, MAE symmetrically with good strength Wound c/d/I, JP in place  [facility] reviewed demonstrates pneumocephlaus over right frontal pole and left convexity. Slightly improved MLS. No new hemorrhage.  IMPRESSION:  66 y.o. male POD#3 s/p left crani for SDH, neurologically improved - Hx of EtOH abuse  PLAN: - Cont to monitor in ICU - JP d/c'ed this am - CIWA protocol   Lisbeth Renshaw, MD [facility]

## 2022-06-11 LAB — GLUCOSE, CAPILLARY
Glucose-Capillary: 161 mg/dL — ABNORMAL HIGH (ref 70–99)
Glucose-Capillary: 188 mg/dL — ABNORMAL HIGH (ref 70–99)
Glucose-Capillary: 188 mg/dL — ABNORMAL HIGH (ref 70–99)
Glucose-Capillary: 176 mg/dL — ABNORMAL HIGH (ref 70–99)
Glucose-Capillary: 157 mg/dL — ABNORMAL HIGH (ref 70–99)
Glucose-Capillary: 208 mg/dL — ABNORMAL HIGH (ref 70–99)

## 2022-06-11 LAB — PHOSPHORUS
Phosphorus: 2.1 mg/dL — ABNORMAL LOW (ref 2.5–4.6)
Phosphorus: 1.6 mg/dL — ABNORMAL LOW (ref 2.5–4.6)

## 2022-06-11 LAB — MAGNESIUM
Magnesium: 1.8 mg/dL (ref 1.7–2.4)
Magnesium: 2.1 mg/dL (ref 1.7–2.4)

## 2022-06-11 MED ORDER — ORAL CARE MOUTH RINSE
15.0000 mL | OROMUCOSAL | Status: DC
  Administered 2022-06-11 – 2022-07-06 (×91): 15 mL via OROMUCOSAL

## 2022-06-11 MED ORDER — ORAL CARE MOUTH RINSE: 15.0000 mL | OROMUCOSAL | Status: DC | PRN

## 2022-06-11 MED ORDER — POTASSIUM & SODIUM PHOSPHATES 280-160-250 MG PO PACK
1.0000 | PACK | Freq: Three times a day (TID) | ORAL | Status: AC
  Administered 2022-06-11 (×3): 1
  Filled 2022-06-11 (×3): qty 1

## 2022-06-11 MED ORDER — HYDRALAZINE HCL 20 MG/ML IJ SOLN
10.0000 mg | Freq: Four times a day (QID) | INTRAMUSCULAR | Status: DC | PRN
Start: 2022-06-11 — End: 2022-07-06
  Administered 2022-06-12 – 2022-06-17 (×8): 10 mg via INTRAVENOUS
  Filled 2022-06-11 (×10): qty 1

## 2022-06-11 MED ORDER — MAGNESIUM SULFATE 2 GM/50ML IV SOLN
2.0000 g | Freq: Once | INTRAVENOUS | Status: AC
  Administered 2022-06-11: 2 g via INTRAVENOUS
  Filled 2022-06-11: qty 50

## 2022-06-11 NOTE — Progress Notes (Signed)
Postop day 3.  Patient about the same.  Awake.  Not particularly interactive.  Minimally verbal.  Will answer some simple questions.  Follows commands bilaterally.  Strength equal.  Wound clean and dry.  Abdomen soft.  Progressing slowly following craniotomy for subdural hematoma.  Continue efforts at mobilization and therapy.  Watch for signs of alcohol withdrawal.  Check follow-up CT scan in morning.

## 2022-06-11 NOTE — Progress Notes (Signed)
Inpatient Rehab Admissions Coordinator Note:   Per therapy recommendations patient was screened for CIR candidacy by Stephania Fragmin, PT. At this time, pt appears to be a potential candidate for CIR. I will place an order for rehab consult for full assessment, per our protocol.  Please contact me any with questions.Estill Dooms, PT, DPT [phone] 06/11/22 12:26 PM

## 2022-06-11 NOTE — Evaluation (Signed)
Physical Therapy Evaluation Patient Details Name: Jose Li MRN: [id number] DOB: 1956-08-03 Today's Date: 06/11/2022  History of Present Illness  66 year old male found 06/07/22 on ground at home during wellness check. YK:DXIPJ acute hemispheric subdural hematoma on the left with mass effect. 7/25: Left frontotemporoparietal craniotomy and evacuation of acute posttraumatic subdural hematoma, placement of subdural drain(has been pulled). PHMx: seizure disorder, alcohol disorder.   Clinical Impression  Pt presents with condition above and deficits mentioned below, see PT Problem List. Pt is a poor historian, repeatedly stating "yes" to questions, but he reports he was IND and lived alone in a house PTA. Pt demonstrates deficits in cognition, balance, activity tolerance, coordination, and gross strength (more automatic with L leg movement than R leg). Pt required modAx1-2 for bed mobility, minAx2 for sit > stand transfer, and modAx2 with bil HHA to take side steps at EOB today, displaying difficulty sequencing weight shifting to take steps. Considering pt's drastic functional decline, recommending intensive therapy in the AIR setting. Will continue to follow acutely.       Recommendations for follow up therapy are one component of a multi-disciplinary discharge planning process, led by the attending physician.  Recommendations may be updated based on patient status, additional functional criteria and insurance authorization.  Follow Up Recommendations Acute inpatient rehab (3hours/day)      Assistance Recommended at Discharge Frequent or constant Supervision/Assistance  Patient can return home with the following  A lot of help with walking and/or transfers;A lot of help with bathing/dressing/bathroom;Assistance with cooking/housework;Direct supervision/assist for medications management;Direct supervision/assist for financial management;Assist for transportation;Help with stairs or ramp for  entrance    Equipment Recommendations Rolling walker (2 wheels);BSC/3in1  Recommendations for Other Services  Rehab consult    Functional Status Assessment Patient has had a recent decline in their functional status and demonstrates the ability to make significant improvements in function in a reasonable and predictable amount of time.     Precautions / Restrictions Precautions Precautions: Fall Precaution Comments: bil mittens; posey belt; bil wrist restraints Restrictions Weight Bearing Restrictions: No      Mobility  Bed Mobility Overal bed mobility: Needs Assistance Bed Mobility: Supine to Sit, Rolling, Sit to Supine Rolling: Mod assist   Supine to sit: Mod assist, +2 for physical assistance, +2 for safety/equipment, HOB elevated Sit to supine: Mod assist, +2 for safety/equipment, HOB elevated   General bed mobility comments: Mod A to initiate legs to EOB and min A for trunk elevation. ModA to roll either direction. ModA to direct pt with return to supine    Transfers Overall transfer level: Needs assistance Equipment used: 2 person hand held assist Transfers: Sit to/from Stand Sit to Stand: Min assist, +2 physical assistance, +2 safety/equipment           General transfer comment: MinAx2 to steady with power up to stand from EOB, lateral step up towards HOB Mod A +2 with A to weight shift for stepping sideways    Ambulation/Gait Ambulation/Gait assistance: Mod assist, +2 physical assistance, +2 safety/equipment Gait Distance (Feet): 4 Feet Assistive device: 2 person hand held assist Gait Pattern/deviations: Decreased step length - right, Decreased step length - left, Decreased stride length, Decreased weight shift to right, Decreased weight shift to left, Trunk flexed, Narrow base of support Gait velocity: reduced Gait velocity interpretation: <1.31 ft/sec, indicative of household ambulator   General Gait Details: Pt needing modAx2 for stability and to cue  weight shifting bil to take side steps at EOB to [facility].  Pt attempting to sit prematurely often  Stairs            Wheelchair Mobility    Modified Rankin (Stroke Patients Only) Modified Rankin (Stroke Patients Only) Pre-Morbid Rankin Score: No symptoms Modified Rankin: Moderately severe disability     Balance Overall balance assessment: Needs assistance Sitting-balance support: Bilateral upper extremity supported, Feet supported Sitting balance-Leahy Scale: Fair     Standing balance support: Bilateral upper extremity supported Standing balance-Leahy Scale: Poor Standing balance comment: Bil HHA                             Pertinent Vitals/Pain Pain Assessment Pain Assessment: Faces Faces Pain Scale: Hurts a little bit Pain Location: generalized, pt unable to tell where Pain Descriptors / Indicators: Grimacing, Moaning Pain Intervention(s): Limited activity within patient's tolerance, Monitored during session, Repositioned    Home Living Family/patient expects to be discharged to:: Private residence Living Arrangements: Alone   Type of Home: House Home Access: Stairs to enter   [employer] of Steps: unable to tell us (kept saying"yes")       Additional Comments: poor historian, kept saying "yes" to most questions    Prior Function Prior Level of Function : Independent/Modified Independent;Patient poor historian/Family not available (per pt report)                     Hand Dominance   Dominant Hand: Right    Extremity/Trunk Assessment   Upper Extremity Assessment Upper Extremity Assessment: Defer to OT evaluation    Lower Extremity Assessment Lower Extremity Assessment: Generalized weakness;Difficult to assess due to impaired cognition (more automatic with movements with L leg, less so with R leg)    Cervical / Trunk Assessment Cervical / Trunk Assessment: Normal  Communication   Communication: No difficulties  Cognition  Arousal/Alertness: Lethargic Behavior During Therapy: Flat affect, Restless (agitated with questions) Overall Cognitive Status: Impaired/Different from baseline Area of Impairment: Orientation, Following commands, Safety/judgement, Awareness, Attention, Problem solving                 Orientation Level: Disoriented to, Place, Time Current Attention Level: Sustained   Following Commands: Follows one step commands inconsistently, Follows one step commands with increased time Safety/Judgement: Decreased awareness of safety, Decreased awareness of deficits Awareness: Intellectual Problem Solving: Slow processing, Decreased initiation, Difficulty sequencing, Requires verbal cues, Requires tactile cues General Comments: Pt needing redirecting and multi-modal cues for sequencing. Poor memory of prupose of restraints.        General Comments General comments (skin integrity, edema, etc.): Pt did report dizziness when he layed down and rolled to his right side    Exercises     Assessment/Plan    PT Assessment Patient needs continued PT services  PT Problem List Decreased strength;Decreased activity tolerance;Decreased balance;Decreased mobility;Decreased coordination;Decreased cognition;Decreased knowledge of use of DME;Decreased safety awareness       PT Treatment Interventions DME instruction;Gait training;Stair training;Functional mobility training;Therapeutic activities;Therapeutic exercise;Balance training;Neuromuscular re-education;Cognitive remediation;Patient/family education    PT Goals (Current goals can be found in the Care Plan section)  Acute Rehab PT Goals Patient Stated Goal: to lay back down PT Goal Formulation: With patient Time For Goal Achievement: 06/25/22 Potential to Achieve Goals: Good    Frequency Min 4X/week     Co-evaluation PT/OT/SLP Co-Evaluation/Treatment: Yes Reason for Co-Treatment: Necessary to address cognition/behavior during functional  activity;For patient/therapist safety;To address functional/ADL transfers PT goals addressed during session: Mobility/safety with  mobility;Balance OT goals addressed during session: Strengthening/ROM;ADL's and self-care       AM-PAC PT "6 Clicks" Mobility  Outcome Measure Help needed turning from your back to your side while in a flat bed without using bedrails?: A Lot Help needed moving from lying on your back to sitting on the side of a flat bed without using bedrails?: Total Help needed moving to and from a bed to a chair (including a wheelchair)?: Total Help needed standing up from a chair using your arms (e.g., wheelchair or bedside chair)?: Total Help needed to walk in hospital room?: Total Help needed climbing 3-5 steps with a railing? : Total 6 Click Score: 7    End of Session Equipment Utilized During Treatment: Gait belt Activity Tolerance: Patient tolerated treatment well;Patient limited by fatigue;Patient limited by pain Patient left: in bed;with call bell/phone within reach;with bed alarm set;with restraints reapplied Nurse Communication: Mobility status PT Visit Diagnosis: Unsteadiness on feet (R26.81);Other abnormalities of gait and mobility (R26.89);Muscle weakness (generalized) (M62.81);Difficulty in walking, not elsewhere classified (R26.2);Dizziness and giddiness (R42)    Time: [id number] PT Time Calculation (min) (ACUTE ONLY): 16 min   Charges:   PT Evaluation $PT Eval Moderate Complexity: 1 Mod          Moishe Spice, PT, DPT Acute Rehabilitation Services  Office: [phone]   Orvan Falconer 06/11/2022, 11:49 AM

## 2022-06-11 NOTE — Evaluation (Signed)
Occupational Therapy Evaluation Patient Details Name: Jose Li MRN: [id number] DOB: 08/17/1956 Today's Date: 06/11/2022   History of Present Illness 66 year old male found on ground at home during wellness check. KG:URKYH acute hemispheric subdural hematoma on the left with mass effect. 7/25: Left frontotemporoparietal craniotomy and evacuation of acute posttraumatic subdural hematoma, placement of subdural drain(has been pulled). PHMx: seizure disorder, alcohol disorder.   Clinical Impression   This 66 yo male admitted and underwent above presents to acute OT with PLOF of being independent at home per his report and he was found down at his home. He currently needs max-total A for basic ADLs and Min-Mod A +2 for all mobility. He will continue to benefit from acute OT with follow up on AIR.     Recommendations for follow up therapy are one component of a multi-disciplinary discharge planning process, led by the attending physician.  Recommendations may be updated based on patient status, additional functional criteria and insurance authorization.   Follow Up Recommendations  Acute inpatient rehab (3hours/day)    Assistance Recommended at Discharge Frequent or constant Supervision/Assistance  Patient can return home with the following Two people to help with walking and/or transfers;Two people to help with bathing/dressing/bathroom;Assistance with cooking/housework;Assistance with feeding;Help with stairs or ramp for entrance;Assist for transportation;Direct supervision/assist for financial management;Direct supervision/assist for medications management    Functional Status Assessment  Patient has had a recent decline in their functional status and demonstrates the ability to make significant improvements in function in a reasonable and predictable amount of time.  Equipment Recommendations  Other (comment) (TBD Next venue)       Precautions / Restrictions Precautions Precautions:  Fall Restrictions Weight Bearing Restrictions: No      Mobility Bed Mobility Overal bed mobility: Needs Assistance Bed Mobility: Supine to Sit     Supine to sit: Mod assist, +2 for physical assistance, +2 for safety/equipment, HOB elevated     General bed mobility comments: Mod A to initiate legs to EOB and min A for trunk elevation    Transfers Overall transfer level: Needs assistance Equipment used: 2 person hand held assist Transfers: Sit to/from Stand Sit to Stand: Min assist, +2 physical assistance, +2 safety/equipment           General transfer comment: lateral step up towards HOB Mod A +2 with A to weight shift for stepping sideways      Balance Overall balance assessment: Needs assistance Sitting-balance support: Bilateral upper extremity supported, Feet supported Sitting balance-Leahy Scale: Fair     Standing balance support: Bilateral upper extremity supported Standing balance-Leahy Scale: Poor Standing balance comment: Bil HHA                           ADL either performed or assessed with clinical judgement   ADL Overall ADL's : Needs assistance/impaired Eating/Feeding: NPO   Grooming: Maximal assistance;Sitting   Upper Body Bathing: Maximal assistance;Sitting   Lower Body Bathing: Total assistance Lower Body Bathing Details (indicate cue type and reason): min A +2 for safety sit<>stand from EOB Upper Body Dressing : Total assistance;Sitting   Lower Body Dressing: Total assistance Lower Body Dressing Details (indicate cue type and reason): min A +2 for safety sit<>stand from EOB Toilet Transfer: Moderate assistance;+2 for safety/equipment;+2 for physical assistance Toilet Transfer Details (indicate cue type and reason): side step up towards EOB, A for weight shifting to take lateral steps Toileting- Clothing Manipulation and Hygiene: Total assistance Toileting - Clothing  Manipulation Details (indicate cue type and reason): min A +2 for  safety sit<>stand from EOB             Vision   Additional Comments: Continue to assess when opens and maintains eyes open more readily, did report dizziness when he layed down and rolled over on his right side            Pertinent Vitals/Pain Pain Assessment Pain Assessment: CPOT Facial Expression: Tense Body Movements: Restlessness Muscle Tension: Relaxed Compliance with ventilator (intubated pts.): N/A Vocalization (extubated pts.): Talking in normal tone or no sound CPOT Total: 3 Pain Intervention(s): Limited activity within patient's tolerance, Repositioned     Hand Dominance Right   Extremity/Trunk Assessment Upper Extremity Assessment Upper Extremity Assessment: Generalized weakness           Communication Communication Communication: No difficulties   Cognition Arousal/Alertness: Lethargic Behavior During Therapy: Flat affect, Restless (agitated with questions) Overall Cognitive Status: Impaired/Different from baseline Area of Impairment: Orientation, Following commands, Safety/judgement, Awareness, Attention, Problem solving                 Orientation Level: Disoriented to, Place, Time Current Attention Level: Sustained   Following Commands: Follows one step commands inconsistently, Follows one step commands with increased time Safety/Judgement: Decreased awareness of safety, Decreased awareness of deficits Awareness: Intellectual Problem Solving: Slow processing, Decreased initiation, Difficulty sequencing, Requires verbal cues, Requires tactile cues       General Comments  Pt did report dizziness when he layed down and rolled to his right side            Home Living Family/patient expects to be discharged to:: Private residence Living Arrangements: Alone   Type of Home: House Home Access: Stairs to enter [employer] of Steps: unable to tell us (kept saying"yes")                              Prior  Functioning/Environment Prior Level of Function : Independent/Modified Independent (per pt report)                        OT Problem List: Impaired balance (sitting and/or standing);Decreased activity tolerance;Decreased cognition;Decreased safety awareness      OT Treatment/Interventions: Self-care/ADL training;DME and/or AE instruction;Patient/family education;Balance training    OT Goals(Current goals can be found in the care plan section) Acute Rehab OT Goals Patient Stated Goal: to lay back down OT Goal Formulation: With patient Time For Goal Achievement: 06/25/22 Potential to Achieve Goals: Good  OT Frequency: Min 2X/week    Co-evaluation PT/OT/SLP Co-Evaluation/Treatment: Yes Reason for Co-Treatment: Necessary to address cognition/behavior during functional activity;For patient/therapist safety;To address functional/ADL transfers PT goals addressed during session: Mobility/safety with mobility;Balance;Strengthening/ROM OT goals addressed during session: Strengthening/ROM;ADL's and self-care      AM-PAC OT "6 Clicks" Daily Activity     Outcome Measure Help from another person eating meals?: Total Help from another person taking care of personal grooming?: Total Help from another person toileting, which includes using toliet, bedpan, or urinal?: A Lot Help from another person bathing (including washing, rinsing, drying)?: A Lot Help from another person to put on and taking off regular upper body clothing?: A Lot Help from another person to put on and taking off regular lower body clothing?: Total 6 Click Score: 9   End of Session Equipment Utilized During Treatment: Gait belt Nurse Communication: Mobility status (chat text)  Activity  Tolerance: Patient limited by lethargy (and stating "I just don't feel well:) Patient left: in bed;with call bell/phone within reach;with bed alarm set;with restraints reapplied  OT Visit Diagnosis: Unsteadiness on feet (R26.81);Other  abnormalities of gait and mobility (R26.89);Other symptoms and signs involving cognitive function                Time: [id number] OT Time Calculation (min): 17 min Charges:  OT General Charges $OT Visit: 1 Visit OT Evaluation $OT Eval Moderate Complexity: 1 Mod  Jose Li, OTR/L Acute [employer] [facility] [phone] Office [phone]    Evette Georges 06/11/2022, 11:29 AM

## 2022-06-12 ENCOUNTER — Inpatient Hospital Stay (HOSPITAL_COMMUNITY): Payer: Medicare Other

## 2022-06-12 LAB — GLUCOSE, CAPILLARY
Glucose-Capillary: 118 mg/dL — ABNORMAL HIGH (ref 70–99)
Glucose-Capillary: 179 mg/dL — ABNORMAL HIGH (ref 70–99)
Glucose-Capillary: 140 mg/dL — ABNORMAL HIGH (ref 70–99)
Glucose-Capillary: 127 mg/dL — ABNORMAL HIGH (ref 70–99)
Glucose-Capillary: 162 mg/dL — ABNORMAL HIGH (ref 70–99)
Glucose-Capillary: 161 mg/dL — ABNORMAL HIGH (ref 70–99)

## 2022-06-12 LAB — BASIC METABOLIC PANEL
Anion gap: 5 (ref 5–15)
BUN: 14 mg/dL (ref 8–23)
CO2: 23 mmol/L (ref 22–32)
Calcium: 7.9 mg/dL — ABNORMAL LOW (ref 8.9–10.3)
Chloride: 117 mmol/L — ABNORMAL HIGH (ref 98–111)
Creatinine, Ser: 0.8 mg/dL (ref 0.61–1.24)
GFR, Estimated: >60 mL/min (ref >60)
Glucose, Bld: 110 mg/dL — ABNORMAL HIGH (ref 70–99)
Potassium: 3.7 mmol/L (ref 3.5–5.1)
Sodium: 145 mmol/L (ref 135–145)

## 2022-06-12 LAB — CULTURE, BLOOD (ROUTINE X 2)
Culture: NO GROWTH
Culture: NO GROWTH
Special Requests: ADEQUATE
Special Requests: ADEQUATE

## 2022-06-12 LAB — HEMOGLOBIN A1C
Hgb A1c MFr Bld: 4.6 % — ABNORMAL LOW (ref 4.8–5.6)
Mean Plasma Glucose: 85.32 mg/dL

## 2022-06-12 MED ORDER — INSULIN ASPART 100 UNIT/ML IJ SOLN
0.0000 [IU] | INTRAMUSCULAR | Status: DC
  Administered 2022-06-12: 2 [IU] via SUBCUTANEOUS
  Administered 2022-06-12 (×2): 3 [IU] via SUBCUTANEOUS
  Administered 2022-06-12: 5 [IU] via SUBCUTANEOUS
  Administered 2022-06-12 – 2022-06-13 (×2): 2 [IU] via SUBCUTANEOUS
  Administered 2022-06-13: 3 [IU] via SUBCUTANEOUS
  Administered 2022-06-13 (×2): 2 [IU] via SUBCUTANEOUS
  Administered 2022-06-13 – 2022-06-14 (×5): 3 [IU] via SUBCUTANEOUS
  Administered 2022-06-14: 2 [IU] via SUBCUTANEOUS
  Administered 2022-06-14: 3 [IU] via SUBCUTANEOUS
  Administered 2022-06-14 (×2): 2 [IU] via SUBCUTANEOUS
  Administered 2022-06-15: 3 [IU] via SUBCUTANEOUS
  Administered 2022-06-15: 2 [IU] via SUBCUTANEOUS
  Administered 2022-06-15: 3 [IU] via SUBCUTANEOUS
  Administered 2022-06-15: 2 [IU] via SUBCUTANEOUS
  Administered 2022-06-15 – 2022-06-16 (×5): 3 [IU] via SUBCUTANEOUS
  Administered 2022-06-16: 2 [IU] via SUBCUTANEOUS
  Administered 2022-06-16 – 2022-06-17 (×3): 3 [IU] via SUBCUTANEOUS
  Administered 2022-06-17: 2 [IU] via SUBCUTANEOUS
  Administered 2022-06-17 (×2): 3 [IU] via SUBCUTANEOUS
  Administered 2022-06-18 (×2): 2 [IU] via SUBCUTANEOUS
  Administered 2022-06-19 (×2): 3 [IU] via SUBCUTANEOUS
  Administered 2022-06-19 (×3): 2 [IU] via SUBCUTANEOUS
  Administered 2022-06-19 (×2): 3 [IU] via SUBCUTANEOUS
  Administered 2022-06-20: 2 [IU] via SUBCUTANEOUS
  Administered 2022-06-20 (×2): 3 [IU] via SUBCUTANEOUS
  Administered 2022-06-20 – 2022-06-22 (×9): 2 [IU] via SUBCUTANEOUS
  Administered 2022-06-23 (×2): 3 [IU] via SUBCUTANEOUS
  Administered 2022-06-23: 2 [IU] via SUBCUTANEOUS
  Administered 2022-06-24: 5 [IU] via SUBCUTANEOUS
  Administered 2022-06-24 (×3): 3 [IU] via SUBCUTANEOUS
  Administered 2022-06-25 (×2): 2 [IU] via SUBCUTANEOUS
  Administered 2022-06-26: 3 [IU] via SUBCUTANEOUS
  Administered 2022-06-26: 2 [IU] via SUBCUTANEOUS
  Administered 2022-06-26: 3 [IU] via SUBCUTANEOUS
  Administered 2022-06-26 – 2022-06-27 (×4): 2 [IU] via SUBCUTANEOUS
  Administered 2022-06-28: 5 [IU] via SUBCUTANEOUS

## 2022-06-12 MED ORDER — POTASSIUM PHOSPHATES 15 MMOLE/5ML IV SOLN
30.0000 mmol | Freq: Once | INTRAVENOUS | Status: AC
  Administered 2022-06-12: 30 mmol via INTRAVENOUS
  Filled 2022-06-12: qty 10

## 2022-06-12 NOTE — Progress Notes (Signed)
Inpatient Rehab Admissions Coordinator:  Consult received. Saw pt at bedside. Pt lethargic d/t medication. Called pt's significant other Paula. Explained CIR goals and expectations to her. She acknowledged understanding. She is interested in pt pursuing CIR however, she is unsure if pt will have 24/7 support after discharge. She is going to discuss rehab options with family/friends to determine if the appropriate support can be arranged. Will f/u with her in a few days. Note pt is on Precedex. Pt not medically ready for possible CIR admission. Will continue to follow.   Wolfgang Phoenix, MS, CCC-SLP Admissions Coordinator [phone]

## 2022-06-12 NOTE — Progress Notes (Signed)
Patient ID: Jose Li, male   DOB: 03-25-1956, 66 y.o.   MRN: [id number] BP (!) 161/71   Pulse 80   Temp 98 F (36.7 C)   Resp (!) 25   Wt 74 kg   SpO2 98%   BMI 22.13 kg/m  Responds to voice quickly Will follow some commands Moving all extremities Wound is clean, dry, and without signs of infection Continue working with PT, and OT

## 2022-06-13 LAB — BASIC METABOLIC PANEL
Anion gap: 8 (ref 5–15)
BUN: 13 mg/dL (ref 8–23)
CO2: 22 mmol/L (ref 22–32)
Calcium: 8.3 mg/dL — ABNORMAL LOW (ref 8.9–10.3)
Chloride: 113 mmol/L — ABNORMAL HIGH (ref 98–111)
Creatinine, Ser: 0.79 mg/dL (ref 0.61–1.24)
GFR, Estimated: >60 mL/min (ref >60)
Glucose, Bld: 134 mg/dL — ABNORMAL HIGH (ref 70–99)
Potassium: 4.2 mmol/L (ref 3.5–5.1)
Sodium: 143 mmol/L (ref 135–145)

## 2022-06-13 LAB — GLUCOSE, CAPILLARY
Glucose-Capillary: 123 mg/dL — ABNORMAL HIGH (ref 70–99)
Glucose-Capillary: 153 mg/dL — ABNORMAL HIGH (ref 70–99)
Glucose-Capillary: 137 mg/dL — ABNORMAL HIGH (ref 70–99)
Glucose-Capillary: 139 mg/dL — ABNORMAL HIGH (ref 70–99)
Glucose-Capillary: 174 mg/dL — ABNORMAL HIGH (ref 70–99)
Glucose-Capillary: 179 mg/dL — ABNORMAL HIGH (ref 70–99)

## 2022-06-13 LAB — PHOSPHORUS: Phosphorus: 3.3 mg/dL (ref 2.5–4.6)

## 2022-06-13 MED ORDER — LORAZEPAM 2 MG/ML IJ SOLN
INTRAMUSCULAR | Status: AC
  Filled 2022-06-13: qty 1

## 2022-06-13 MED ORDER — LORAZEPAM 2 MG/ML IJ SOLN
1.0000 mg | INTRAMUSCULAR | Status: DC | PRN
  Administered 2022-06-13 – 2022-06-22 (×6): 2 mg via INTRAVENOUS
  Filled 2022-06-13 (×5): qty 1

## 2022-06-13 MED ORDER — LORAZEPAM 2 MG/ML IJ SOLN
1.0000 mg | INTRAMUSCULAR | Status: DC | PRN
Start: 2022-06-13 — End: 2022-06-13

## 2022-06-13 MED ORDER — PANTOPRAZOLE 2 MG/ML SUSPENSION
40.0000 mg | Freq: Every day | ORAL | Status: DC
Start: 2022-06-13 — End: 2022-06-21
  Administered 2022-06-13 – 2022-06-20 (×7): 40 mg
  Filled 2022-06-13 (×7): qty 20

## 2022-06-13 NOTE — Progress Notes (Signed)
Patient ID: Jose Li, male   DOB: [birth year], 66 y.o.   MRN: [id number] BP (!) 142/70   Pulse 71   Temp 98 F (36.7 C)   Resp 20   Wt 74 kg   SpO2 100%   BMI 22.13 kg/m  Lethargic, eyes open Speech dysarthric, follows some commands Moving extremities Wound is clean and dry

## 2022-06-13 NOTE — Progress Notes (Signed)
Precedex titrated off at 1412.  1535 pt became restless, tachycardic, diaphoretic, and began yelling his head hurt.  Dr. Alphonzo Lemmings notified and ordered PRN Ativan for agitation.  2 mg ativan given at 1542.  Pt remains the same at 1618, precedex restarted per order.

## 2022-06-14 LAB — GLUCOSE, CAPILLARY
Glucose-Capillary: 192 mg/dL — ABNORMAL HIGH (ref 70–99)
Glucose-Capillary: 183 mg/dL — ABNORMAL HIGH (ref 70–99)
Glucose-Capillary: 154 mg/dL — ABNORMAL HIGH (ref 70–99)
Glucose-Capillary: 141 mg/dL — ABNORMAL HIGH (ref 70–99)
Glucose-Capillary: 133 mg/dL — ABNORMAL HIGH (ref 70–99)
Glucose-Capillary: 148 mg/dL — ABNORMAL HIGH (ref 70–99)

## 2022-06-14 NOTE — Progress Notes (Addendum)
Inpatient Rehab Admissions Coordinator:  Called significant other Gunnar Fusi to follow up regarding appropriate dispo for pt. Received no answer. Left a message; awaiting return call.   ADDENDUM 1204: Paula returned call. She is waiting to talk with doctors before being able to determine if she, family, or friends will be able to provide appropriate disposition.  Will continue to follow.  Wolfgang Phoenix, MS, CCC-SLP Admissions Coordinator [phone]

## 2022-06-14 NOTE — Progress Notes (Signed)
Slowly improving.  More awake and interactive.  Answers simple questions.  Moving all 4 extremities.  Strength appears equal.  No evidence of active withdrawal symptoms.  Follow-up head CT scans look good drain has been removed.  Overall progressing about as well as could be hoped.  Continue supportive efforts.

## 2022-06-14 NOTE — Progress Notes (Signed)
Physical Therapy Treatment Patient Details Name: Jose Li MRN: [id number] DOB: 03-14-1956 Today's Date: 06/14/2022   History of Present Illness Pt is 66 year old male found 06/07/22 on ground at home during wellness check  and admitted with subdural hematoma. HY:WVPXT acute hemispheric subdural hematoma on the left with mass effect. 7/25: Left frontotemporoparietal craniotomy and evacuation of acute posttraumatic subdural hematoma, placement of subdural drain(has been pulled). PHMx: seizure disorder, alcohol disorder.    PT Comments    Pt with limited progress today - limited by lethargy, tremors, likely withdrawal symptoms.  He participated in sitting EOB but required max x 2 for transfer.  Max cues for exercises with assist.  Pt declined attempts to stand or further transfers.  Will f/u as able.   Recommendations for follow up therapy are one component of a multi-disciplinary discharge planning process, led by the attending physician.  Recommendations may be updated based on patient status, additional functional criteria and insurance authorization.  Follow Up Recommendations  Acute inpatient rehab (3hours/day)     Assistance Recommended at Discharge Frequent or constant Supervision/Assistance  Patient can return home with the following Assistance with cooking/housework;Direct supervision/assist for medications management;Direct supervision/assist for financial management;Assist for transportation;Help with stairs or ramp for entrance;Two people to help with walking and/or transfers;Two people to help with bathing/dressing/bathroom   Equipment Recommendations   (defer to next venue)    Recommendations for Other Services       Precautions / Restrictions Precautions Precautions: Fall     Mobility  Bed Mobility Overal bed mobility: Needs Assistance Bed Mobility: Supine to Sit, Sit to Supine     Supine to sit: Max assist, +2 for physical assistance Sit to supine: Max assist, +2  for physical assistance   General bed mobility comments: Pt stiff in legs and trunks with trunk extension.  Requiring max A for legs and trunk.  Tried multimodal cues and increased time but pt not able to assist.    Transfers                   General transfer comment: Pt declined attempts to stand.    Ambulation/Gait                   Stairs             Wheelchair Mobility    Modified Rankin (Stroke Patients Only) Modified Rankin (Stroke Patients Only) Pre-Morbid Rankin Score: No symptoms Modified Rankin: Severe disability     Balance Overall balance assessment: Needs assistance Sitting-balance support: Feet supported, Bilateral upper extremity supported Sitting balance-Leahy Scale: Poor Sitting balance - Comments: Initially requiring mod A with posterior bias.  With cues and time able to progress to close supervision but required use of bil UE.  Sat EOB 8 mins before motioning to lay down       Standing balance comment: unable/refused today                            Cognition Arousal/Alertness: Lethargic Behavior During Therapy: Flat affect, Restless Overall Cognitive Status: Impaired/Different from baseline Area of Impairment: Orientation, Following commands, Safety/judgement, Awareness, Attention, Problem solving                 Orientation Level: Disoriented to, Place, Time, Situation Current Attention Level: Focused   Following Commands: Follows one step commands inconsistently Safety/Judgement: Decreased awareness of safety, Decreased awareness of deficits Awareness: Intellectual Problem Solving: Slow processing, Decreased initiation,  Difficulty sequencing, Requires verbal cues, Requires tactile cues General Comments: Pt with minimal speaking.  Mainly says "yes and no" most often "yes."  Not accurate with responses.        Exercises General Exercises - Lower Extremity Ankle Circles/Pumps: PROM, Both, Seated (3 reps  with 30 sec heel cord stretch) Long Arc Quad: AAROM, Both, 5 reps, Seated (with 10 sec knee ext stretch)    General Comments General comments (skin integrity, edema, etc.): No c/o dizziness today.  HR initially at EOB 109 but with fatigue up to 122 bpm and pt returned to supine.  Pt with tremors throughout - noted EtOH withdrawl per chart.      Pertinent Vitals/Pain Pain Assessment Pain Assessment: Faces Faces Pain Scale: Hurts a little bit Pain Location: generalized, pt unable to tell where Pain Descriptors / Indicators: Grimacing, Moaning    Home Living                          Prior Function            PT Goals (current goals can now be found in the care plan section) Progress towards PT goals: Progressing toward goals    Frequency    Min 4X/week      PT Plan Current plan remains appropriate    Co-evaluation              AM-PAC PT "6 Clicks" Mobility   Outcome Measure  Help needed turning from your back to your side while in a flat bed without using bedrails?: A Lot Help needed moving from lying on your back to sitting on the side of a flat bed without using bedrails?: Total Help needed moving to and from a bed to a chair (including a wheelchair)?: Total Help needed standing up from a chair using your arms (e.g., wheelchair or bedside chair)?: Total Help needed to walk in hospital room?: Total Help needed climbing 3-5 steps with a railing? : Total 6 Click Score: 7    End of Session   Activity Tolerance: Patient limited by fatigue (Further tx limited due to fatigue and pt declining) Patient left: in bed;with call bell/phone within reach;with bed alarm set;with restraints reapplied   PT Visit Diagnosis: Unsteadiness on feet (R26.81);Other abnormalities of gait and mobility (R26.89);Muscle weakness (generalized) (M62.81);Difficulty in walking, not elsewhere classified (R26.2);Dizziness and giddiness (R42)     Time: 8099-8338 PT Time Calculation  (min) (ACUTE ONLY): 20 min  Charges:  $Therapeutic Activity: 8-22 mins                     Anise Salvo, PT Acute Rehab [facility] [phone]    Rayetta Humphrey 06/14/2022, 4:09 PM

## 2022-06-15 LAB — GLUCOSE, CAPILLARY
Glucose-Capillary: 163 mg/dL — ABNORMAL HIGH (ref 70–99)
Glucose-Capillary: 155 mg/dL — ABNORMAL HIGH (ref 70–99)
Glucose-Capillary: 128 mg/dL — ABNORMAL HIGH (ref 70–99)
Glucose-Capillary: 169 mg/dL — ABNORMAL HIGH (ref 70–99)
Glucose-Capillary: 156 mg/dL — ABNORMAL HIGH (ref 70–99)
Glucose-Capillary: 129 mg/dL — ABNORMAL HIGH (ref 70–99)

## 2022-06-15 MED ORDER — QUETIAPINE FUMARATE 50 MG PO TABS
50.0000 mg | ORAL_TABLET | Freq: Two times a day (BID) | ORAL | Status: DC
  Administered 2022-06-16 – 2022-06-21 (×10): 50 mg
  Filled 2022-06-15 (×2): qty 1
  Filled 2022-06-15: qty 2
  Filled 2022-06-15 (×7): qty 1

## 2022-06-15 MED ORDER — QUETIAPINE FUMARATE 25 MG PO TABS
50.0000 mg | ORAL_TABLET | Freq: Two times a day (BID) | ORAL | Status: DC
Start: 2022-06-15 — End: 2022-06-15
  Administered 2022-06-15 (×2): 50 mg via ORAL
  Filled 2022-06-15 (×2): qty 2

## 2022-06-15 MED ORDER — CLONIDINE HCL 0.1 MG PO TABS
0.1000 mg | ORAL_TABLET | Freq: Three times a day (TID) | ORAL | Status: DC
  Administered 2022-06-15 (×2): 0.1 mg via ORAL
  Filled 2022-06-15 (×2): qty 1

## 2022-06-15 MED ORDER — ADULT MULTIVITAMIN W/MINERALS CH
1.0000 | ORAL_TABLET | Freq: Every day | ORAL | Status: DC
  Administered 2022-06-16 – 2022-06-21 (×6): 1
  Filled 2022-06-15 (×6): qty 1

## 2022-06-15 NOTE — TOC Initial Note (Signed)
Transition of Care [facility]) - Initial/Assessment Note    Patient Details  Name: Jose Li MRN: [id number] Date of Birth: 1956/02/21  Transition of [facility]) CM/SW Contact:    Dale Bigelow, Student-Social Work Phone Number: 06/15/2022, 2:33 PM  Clinical Narrative:                 MSW intern spoke with patient's significant other, Gunnar Fusi in regards to patient's discharge plan to SNF. Gunnar Fusi stated she was in agreement with the SNF discharge plan because Kaleth is so weak and she cannot physically care for him 24/7. MSW intern advised she would be in touch once bed offers were provided. Gunnar Fusi advised she prefers [facility] not go to [employer].   Expected Discharge Plan: Skilled Nursing Facility Barriers to Discharge: Continued Medical Work up   Patient Goals and CMS Choice   CMS Medicare.gov Compare Post Acute Care list provided to:: Patient    Expected Discharge Plan and Services Expected Discharge Plan: Skilled Nursing Facility                                              Prior Living Arrangements/Services   Lives with:: Significant Other   Do you feel safe going back to the place where you live?: Yes      Need for Family Participation in Patient Care: Yes (Comment) Care giver support system in place?: Yes (comment)   Criminal Activity/Legal Involvement Pertinent to Current Situation/Hospitalization: No - Comment as needed  Activities of Daily Living      Permission Sought/Granted Permission sought to share information with : Family Supports                Emotional Assessment Appearance:: Appears stated age     Orientation: : Oriented to Self      Admission diagnosis:  Subdural hemorrhage (HCC) [I62.00] Status post craniotomy [Z98.890] Fall, initial encounter [W19.XXXA] Altered mental status, unspecified altered mental status type [R41.82] SDH (subdural hematoma) (HCC) [S06.5XAA] Patient Active Problem List   Diagnosis Date Noted    Malnutrition of moderate degree 06/10/2022   Status post craniotomy 06/08/2022   COVID-19 virus infection 04/14/2022   Anterior chest wall pain 12/06/2021   Need for immunization against influenza 09/11/2020   Pain of paraspinal muscle 01/14/2020   History of BPH 09/24/2019   Chronic kidney disease (CKD), stage III (moderate) (HCC) 07/13/2019   Varicose veins of right lower extremity with complications 01/24/2017   TBI (traumatic brain injury) (HCC)    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 [place] [address] Phone: [phone] Fax: [phone]  [facility] [address] [place] [address] Phone: [phone] Fax: [phone]     Social Determinants of Health (SDOH) Interventions    Readmission Risk Interventions     No data to display

## 2022-06-15 NOTE — Progress Notes (Addendum)
Inpatient Rehabilitation Admissions Coordinator   I have left Jose Li, his significant other, a voicemail to follow up on discussions for discharge planning needs.  Ottie Glazier, RN, MSN Rehab Admissions Coordinator [phone] 06/15/2022 12:28 PM  I spoke with Jose Li by phone. She is only caregiver supports he has and she works and can not provide 24/7 care that he would need after a CIR admit. Therefore SNF is now recommended and she is in agreement. I will alert acute team and TOC. We will sign off.  Ottie Glazier, RN, MSN Rehab Admissions Coordinator [phone] 06/15/2022 12:41 PM

## 2022-06-15 NOTE — Progress Notes (Signed)
Nutrition Follow-up  DOCUMENTATION CODES:   Non-severe (moderate) malnutrition in context of chronic illness  INTERVENTION:   Tube Feeding via Cortrak:   Osmolite 1.5 at 55 ml/hr Pro-Source TF 45 mL BID  This provides 105 g of protein, 2060 kcals, 1003 mL of free water  Thiamine 100 mg daily  MVI with minerals daily  NUTRITION DIAGNOSIS:   Moderate Malnutrition related to social / environmental circumstances as evidenced by moderate muscle depletion, severe muscle depletion, moderate fat depletion. Ongoing.   GOAL:   Patient will meet greater than or equal to 90% of their needs Met with TF.   MONITOR:   Labs, Weight trends, TF tolerance  REASON FOR ASSESSMENT:   Other (Comment) (NPO, Cortrak order) Enteral/tube feeding initiation and management  ASSESSMENT:   66 yo male admitted with L SDH requiring craniotomy with hematoma evacuation. PMH includes EtOH abuse, seizure disorder, HTN, HLD, depression, anxiety. Surgial hx includes trach s/p removal, hernia repair and R hip and femur fx  Neurosurgery reports slowly improving, more awake and interactive. Family determining disposition. Pt remains on precedex and NPO with cortrak in place.   7/25  Left frontotemporoparietal craniotomy and evacuation of acute posttraumatic subdural hematoma, placement of subdural drain 7/27 s/p cortrak placement; tip in proximal duodenum per xray   Labs:  CBG's: 133-192  Meds: colace, SSI, liquid MVI, protoinx, thiamine  NS with 20 mEq KCl/L @ 100 ml/hr  Precedex   Net IO Since Admission: 14,342.1 mL [06/15/22 1052] UOP: 2800 ml Moderate edema noted per RN assessment   Diet Order:   Diet Order             Diet NPO time specified  Diet effective now                   EDUCATION NEEDS:   Not appropriate for education at this time  Skin:  Skin Assessment: Skin Integrity Issues: Skin Integrity Issues:: Incisions Incisions: head-post craniotomy  Last BM:  8/1  medium  Height:   Ht Readings from Last 1 Encounters:  04/21/22 6' (1.829 m)    Weight:   Wt Readings from Last 1 Encounters:  06/14/22 74.8 kg   BMI:  Body mass index is 22.37 kg/m.  Estimated Nutritional Needs:   Kcal:  2000-2200 kcals  Protein:  100-115 g  Fluid:  >/= 2 L   P., RD, LDN, CNSC See AMiON for contact information    

## 2022-06-15 NOTE — Progress Notes (Signed)
Physical Therapy Treatment Patient Details Name: Jose Li MRN: [id number] DOB: [birth year] Today's Date: 06/15/2022   History of Present Illness Pt is 66 year old male found 06/07/22 on ground at home during wellness check  and admitted with subdural hematoma. IR:CVELF acute hemispheric subdural hematoma on the left with mass effect. 7/25: Left frontotemporoparietal craniotomy and evacuation of acute posttraumatic subdural hematoma, placement of subdural drain(has been pulled). PHMx: seizure disorder, alcohol disorder.    PT Comments    Pt more alert and increased communication today.  He required max encouragement and multi-modal cues for therapy.  Pt's significant other present and encouraging him to participate.  Pt participated in EOB activity with improved balance and stood briefly at EOB but was unable to take any steps.  Did note AIR no longer following for possible admission.  Updated to SNF.     Recommendations for follow up therapy are one component of a multi-disciplinary discharge planning process, led by the attending physician.  Recommendations may be updated based on patient status, additional functional criteria and insurance authorization.  Follow Up Recommendations  Skilled nursing-short term rehab (<3 hours/day)     Assistance Recommended at Discharge Frequent or constant Supervision/Assistance  Patient can return home with the following Assistance with cooking/housework;Direct supervision/assist for medications management;Direct supervision/assist for financial management;Assist for transportation;Help with stairs or ramp for entrance;Two people to help with walking and/or transfers;Two people to help with bathing/dressing/bathroom   Equipment Recommendations  Other (comment) (defer to next venue)    Recommendations for Other Services       Precautions / Restrictions Precautions Precautions: Fall Precaution Comments: bil mittens; posey belt; bil wrist restraints      Mobility  Bed Mobility Overal bed mobility: Needs Assistance Bed Mobility: Supine to Sit, Sit to Supine     Supine to sit: Mod assist, +2 for physical assistance Sit to supine: Mod assist, +2 for physical assistance   General bed mobility comments: Increased time and multimodal cues    Transfers Overall transfer level: Needs assistance Equipment used: 2 person hand held assist Transfers: Sit to/from Stand Sit to Stand: Mod assist, +2 physical assistance           General transfer comment: Performed 3 attempts to stand with max encouragement (from staff and girlfriend) and mod A of 2.  Able to stand up briefly on last attempt but not able to take steps.    Ambulation/Gait                   Stairs             Wheelchair Mobility    Modified Rankin (Stroke Patients Only) Modified Rankin (Stroke Patients Only) Pre-Morbid Rankin Score: No symptoms Modified Rankin: Severe disability     Balance Overall balance assessment: Needs assistance Sitting-balance support: Feet supported, Bilateral upper extremity supported, No upper extremity supported Sitting balance-Leahy Scale: Poor Sitting balance - Comments: Started with bil UE support and mod A with posterior bias.  Able to progress to hands in lap and close min guard/minA at times. Cues for balance and leaning forward.  At EOB for at least 10 mins                                    Cognition Arousal/Alertness: Lethargic Behavior During Therapy: Restless Overall Cognitive Status: Impaired/Different from baseline Area of Impairment: Orientation, Following commands, Safety/judgement, Awareness, Attention, Problem solving  Orientation Level: Disoriented to, Place, Time, Situation Current Attention Level: Sustained   Following Commands: Follows one step commands inconsistently Safety/Judgement: Decreased awareness of safety, Decreased awareness of deficits Awareness:  Emergent Problem Solving: Slow processing, Decreased initiation, Difficulty sequencing, Requires verbal cues, Requires tactile cues General Comments: Pt more alert today, talking in 4-5 word sentences but remains confused and requiring mod-max multimodal cues and encouragement.  Pt's significant other present and encouraging him to participate        Exercises General Exercises - Lower Extremity Ankle Circles/Pumps: AROM, Both, 5 reps, Seated Long Arc Quad: AROM, Both, 5 reps, Seated (max encouragement; limited motion/effort)    General Comments General comments (skin integrity, edema, etc.): VSS.  No tremors today      Pertinent Vitals/Pain Pain Assessment Pain Assessment: No/denies pain    Home Living                          Prior Function            PT Goals (current goals can now be found in the care plan section) Progress towards PT goals: Progressing toward goals    Frequency    Min 4X/week      PT Plan Discharge plan needs to be updated (noted AIR signed off as pt will need more than 2 weeks and plan for SNF)    Co-evaluation              AM-PAC PT "6 Clicks" Mobility   Outcome Measure  Help needed turning from your back to your side while in a flat bed without using bedrails?: A Lot Help needed moving from lying on your back to sitting on the side of a flat bed without using bedrails?: Total Help needed moving to and from a bed to a chair (including a wheelchair)?: Total Help needed standing up from a chair using your arms (e.g., wheelchair or bedside chair)?: Total Help needed to walk in hospital room?: Total Help needed climbing 3-5 steps with a railing? : Total 6 Click Score: 7    End of Session Equipment Utilized During Treatment: Gait belt Activity Tolerance: Patient tolerated treatment well Patient left: in bed;with call bell/phone within reach;with bed alarm set;with restraints reapplied Nurse Communication: Mobility status PT  Visit Diagnosis: Unsteadiness on feet (R26.81);Other abnormalities of gait and mobility (R26.89);Muscle weakness (generalized) (M62.81);Difficulty in walking, not elsewhere classified (R26.2);Dizziness and giddiness (R42)     Time: 1246-1310 PT Time Calculation (min) (ACUTE ONLY): 24 min  Charges:  $Therapeutic Activity: 23-37 mins                     Anise Salvo, PT Acute Rehab [facility] [phone]    Rayetta Humphrey 06/15/2022, 2:37 PM

## 2022-06-15 NOTE — Progress Notes (Signed)
Overall about the same.  Still with some intermittent periods of agitation but no clear-cut evidence of alcohol withdrawal.  He is awake.  He answers some simple questions.  He follows commands bilaterally.  He complains of lower back pain but no headache.  Status post craniotomy for subdural evacuation.  Continue supportive efforts

## 2022-06-16 LAB — GLUCOSE, CAPILLARY
Glucose-Capillary: 156 mg/dL — ABNORMAL HIGH (ref 70–99)
Glucose-Capillary: 141 mg/dL — ABNORMAL HIGH (ref 70–99)
Glucose-Capillary: 161 mg/dL — ABNORMAL HIGH (ref 70–99)
Glucose-Capillary: 182 mg/dL — ABNORMAL HIGH (ref 70–99)
Glucose-Capillary: 166 mg/dL — ABNORMAL HIGH (ref 70–99)
Glucose-Capillary: 194 mg/dL — ABNORMAL HIGH (ref 70–99)

## 2022-06-16 MED ORDER — LEVETIRACETAM 100 MG/ML PO SOLN
1500.0000 mg | Freq: Two times a day (BID) | ORAL | Status: DC
Start: 2022-06-16 — End: 2022-06-21
  Administered 2022-06-16 – 2022-06-21 (×10): 1500 mg
  Filled 2022-06-16 (×10): qty 15

## 2022-06-16 MED ORDER — CLONIDINE HCL 0.1 MG PO TABS
0.1000 mg | ORAL_TABLET | Freq: Three times a day (TID) | ORAL | Status: DC
  Administered 2022-06-16 – 2022-06-21 (×15): 0.1 mg
  Filled 2022-06-16 (×15): qty 1

## 2022-06-16 NOTE — Progress Notes (Signed)
No new issues or problems overnight.  Continues to run low-grade fevers without evidence of any more serious infection.  He is awake and aware.  He answers some simple questions.  He follows commands bilaterally but remains somnolent and blunted.  Wound is healing well.  Neck is supple.  Abdomen soft.  Chest clear.  Progressing following subdural hematoma evacuation.  Continue supportive efforts.  Check electrolytes.  Okay to transfer to stepdown.

## 2022-06-16 NOTE — Progress Notes (Signed)
Physical Therapy Treatment Patient Details Name: Jose Li MRN: [id number] DOB: [birth year] Today's Date: 06/16/2022   History of Present Illness Pt is 66 year old male found 06/07/22 on ground at home during wellness check  and admitted with subdural hematoma. [id number] acute hemispheric subdural hematoma on the left with mass effect. 7/25: Left frontotemporoparietal craniotomy and evacuation of acute posttraumatic subdural hematoma, placement of subdural drain(has been pulled). PHMx: seizure disorder, alcohol disorder.    PT Comments    Pt seen today PT/OT in hopes of progressing mobility OOB; however, pt not willing to attempt standing and needing max encouragement/education to sitting.  Limited by pain, willingness, and cognition.  Provided max encouragement and education but pt not tolerating. He required max x 2 to transition to EOB but with return to supine pt putting forth increased effort and only needing min A of 2.  Cont to progress as able.     Recommendations for follow up therapy are one component of a multi-disciplinary discharge planning process, led by the attending physician.  Recommendations may be updated based on patient status, additional functional criteria and insurance authorization.  Follow Up Recommendations  Skilled nursing-short term rehab (<3 hours/day)     Assistance Recommended at Discharge Frequent or constant Supervision/Assistance  Patient can return home with the following Assistance with cooking/housework;Direct supervision/assist for medications management;Direct supervision/assist for financial management;Assist for transportation;Help with stairs or ramp for entrance;Two people to help with walking and/or transfers;Two people to help with bathing/dressing/bathroom   Equipment Recommendations  Other (comment) (defer to next venue)    Recommendations for Other Services       Precautions / Restrictions Precautions Precautions: Fall Precaution  Comments: bil mittens; posey belt; bil wrist restraints Restrictions Weight Bearing Restrictions: No     Mobility  Bed Mobility Overal bed mobility: Needs Assistance Bed Mobility: Supine to Sit, Sit to Supine     Supine to sit: Max assist, +2 for physical assistance Sit to supine: Min assist, +2 for safety/equipment   General bed mobility comments: Pt required assist for trunk and legs off of bed - limited by weakness, cognition, and motivation.  For return to supine, assist of 2 for safety but pt lowering trunk and getting legs back in bed.  Max of 2 to scoot up in bed.  Pt did bridge to move hips once but then would not repeat.    Transfers                   General transfer comment: unable today; pt requesting to return to supine and pushing to lay down    Ambulation/Gait                   Stairs             Wheelchair Mobility    Modified Rankin (Stroke Patients Only) Modified Rankin (Stroke Patients Only) Pre-Morbid Rankin Score: No symptoms Modified Rankin: Severe disability     Balance Overall balance assessment: Needs assistance Sitting-balance support: Feet supported, Bilateral upper extremity supported Sitting balance-Leahy Scale: Poor Sitting balance - Comments: Requiring UE support. Sat EOB for 8-10 mins with varied levels of assist.  Initially mod A with posterior lean but progressing to close guarding.  With fatigue pt began pushing to lay back down requiring mod-max support to stay up while sheet straightened.       Standing balance comment: unable/refused today  Cognition Arousal/Alertness: Awake/alert Behavior During Therapy: Restless, Anxious Overall Cognitive Status: Impaired/Different from baseline Area of Impairment: Orientation, Following commands, Safety/judgement, Awareness, Attention, Problem solving                 Orientation Level: Disoriented to, Place, Time,  Situation Current Attention Level: Sustained   Following Commands: Follows one step commands inconsistently Safety/Judgement: Decreased awareness of safety, Decreased awareness of deficits Awareness: Intellectual Problem Solving: Slow processing, Decreased initiation, Difficulty sequencing, Requires verbal cues, Requires tactile cues General Comments: Pt awake at arrival and talking to therapist but then would close eyes and less participation (question if lethargic vs not wanting to participate).  He had c/o nausea and headache with sitting and holding head.  Pt did not c/o dizziness until asked if he was dizzy.  Low motivation to work with therapy today.        Exercises      General Comments General comments (skin integrity, edema, etc.): VSS      Pertinent Vitals/Pain Pain Assessment Pain Assessment: Faces Faces Pain Scale: Hurts even more Pain Location: generalized and head Pain Descriptors / Indicators: Grimacing, Moaning Pain Intervention(s): Limited activity within patient's tolerance, Monitored during session    Home Living                          Prior Function            PT Goals (current goals can now be found in the care plan section) Progress towards PT goals: Progressing toward goals    Frequency    Min 3X/week      PT Plan Frequency needs to be updated    Co-evaluation PT/OT/SLP Co-Evaluation/Treatment: Yes Reason for Co-Treatment: Complexity of the patient's impairments (multi-system involvement);Necessary to address cognition/behavior during functional activity;For patient/therapist safety PT goals addressed during session: Mobility/safety with mobility;Balance        AM-PAC PT "6 Clicks" Mobility   Outcome Measure  Help needed turning from your back to your side while in a flat bed without using bedrails?: A Lot Help needed moving from lying on your back to sitting on the side of a flat bed without using bedrails?: Total Help  needed moving to and from a bed to a chair (including a wheelchair)?: Total Help needed standing up from a chair using your arms (e.g., wheelchair or bedside chair)?: Total Help needed to walk in hospital room?: Total Help needed climbing 3-5 steps with a railing? : Total 6 Click Score: 7    End of Session Equipment Utilized During Treatment: Gait belt Activity Tolerance: Patient limited by pain;Other (comment) (pt's willingness to participate/cognition) Patient left: in bed;with call bell/phone within reach;with bed alarm set;with restraints reapplied Nurse Communication: Mobility status PT Visit Diagnosis: Unsteadiness on feet (R26.81);Other abnormalities of gait and mobility (R26.89);Muscle weakness (generalized) (M62.81);Difficulty in walking, not elsewhere classified (R26.2);Dizziness and giddiness (R42)     Time: 3016-0109 PT Time Calculation (min) (ACUTE ONLY): 21 min  Charges:  $Therapeutic Activity: 8-22 mins                     Anise Salvo, PT Acute Rehab [facility] [phone]    Rayetta Humphrey 06/16/2022, 5:23 PM

## 2022-06-16 NOTE — Progress Notes (Signed)
Occupational Therapy Treatment Patient Details Name: Jose Li MRN: [id number] DOB: September 24, 1956 Today's Date: 06/16/2022   History of present illness Pt is 66 year old male found 06/07/22 on ground at home during wellness check  and admitted with subdural hematoma. RS:[id number] acute hemispheric subdural hematoma on the left with mass effect. 7/25: Left frontotemporoparietal craniotomy and evacuation of acute posttraumatic subdural hematoma, placement of subdural drain(has been pulled). PHMx: seizure disorder, alcohol disorder.   OT comments  Jose Li was seen in conjunction with PT for safe progression towards acute goals. Upon arrival Jose Li was lethargic with reports of some pain. He required max A +2 to get to EOB with min G sitting balance initially. He tolerated sitting for ~1 minute prior to posteriorly pushing in attempt to return supine. With maximal encouragement Jose Li tolerated sitting for ~8 minutes and returned supine with mod A +2. Pt bridging hips in bed to adjust towards center. Ot to continue to follow in attempt to improve activity tolerance to be an appropriate AIR candidate.    Recommendations for follow up therapy are one component of a multi-disciplinary discharge planning process, led by the attending physician.  Recommendations may be updated based on patient status, additional functional criteria and insurance authorization.    Follow Up Recommendations  Acute inpatient rehab (3hours/day)    Assistance Recommended at Discharge Frequent or constant Supervision/Assistance  Patient can return home with the following  Two people to help with walking and/or transfers;Two people to help with bathing/dressing/bathroom;Assistance with cooking/housework;Assistance with feeding;Help with stairs or ramp for entrance;Assist for transportation;Direct supervision/assist for financial management;Direct supervision/assist for medications management   Equipment Recommendations  Other (comment)     Recommendations for Other Services      Precautions / Restrictions Precautions Precautions: Fall Precaution Comments: bil mittens; posey belt; bil wrist restraints, cortrak Restrictions Weight Bearing Restrictions: No       Mobility Bed Mobility Overal bed mobility: Needs Assistance Bed Mobility: Supine to Sit, Sit to Supine     Supine to sit: Max assist, +2 for physical assistance Sit to supine: Mod assist, +2 for safety/equipment   General bed mobility comments: Pt required assist for trunk and legs off of bed - limited by weakness, cognition, and motivation.  For return to supine, assist of 2 for safety but pt lowering trunk and getting legs back in bed.  Max of 2 to scoot up in bed.  Pt did bridge to move hips once but then would not repeat.    Transfers Overall transfer level: Needs assistance                 General transfer comment: unable today; pt requesting to return to supine and pushing to lay down     Balance Overall balance assessment: Needs assistance Sitting-balance support: Feet supported, Bilateral upper extremity supported Sitting balance-Leahy Scale: Poor Sitting balance - Comments: Requiring UE support. Sat EOB for 8-10 mins with varied levels of assist.  Initially mod A with posterior lean but progressing to close guarding.  With fatigue pt began pushing to lay back down requiring mod-max support to stay up while sheet straightened.       Standing balance comment: unable/refused today                           ADL either performed or assessed with clinical judgement   ADL Overall ADL's : Needs assistance/impaired  General ADL Comments: unable to motivate pt to complete any ADLs despite max efforts. Session complete on EOB. Pt was bathed total A at bed level from NTs prior to session. According to NTs, pt wtih multiple loose DMs.    Extremity/Trunk Assessment Upper Extremity  Assessment Upper Extremity Assessment: RUE deficits/detail;LUE deficits/detail RUE Deficits / Details: difficult to fully assess due to cog - pt moving to touch head and readjust on the EOB. Seemingly weak. RUE Coordination: decreased fine motor;decreased gross motor LUE Deficits / Details: difficult to fully assess due to cog - pt moving to touch head and readjust on the EOB. Seemingly weak. LUE Coordination: decreased gross motor;decreased fine motor   Lower Extremity Assessment Lower Extremity Assessment: Defer to PT evaluation        Vision   Additional Comments: continue to assess - pt with eyes closed much of the session today   Perception Perception Perception: Not tested   Praxis Praxis Praxis: Not tested    Cognition Arousal/Alertness: Awake/alert Behavior During Therapy: Restless, Anxious Overall Cognitive Status: Impaired/Different from baseline Area of Impairment: Orientation, Following commands, Safety/judgement, Awareness, Attention, Problem solving                 Orientation Level: Disoriented to, Place, Time, Situation Current Attention Level: Sustained   Following Commands: Follows one step commands inconsistently Safety/Judgement: Decreased awareness of safety, Decreased awareness of deficits Awareness: Intellectual Problem Solving: Slow processing, Decreased initiation, Difficulty sequencing, Requires verbal cues, Requires tactile cues General Comments: Pt awake at arrival and talking to therapist but then would close eyes and less participation (question if lethargic vs not wanting to participate).  He had c/o nausea and headache with sitting and holding head.  Pt did not c/o dizziness until asked if he was dizzy.  Low motivation to work with therapy today.        Exercises General Exercises - Lower Extremity Ankle Circles/Pumps: AROM, Both, 5 reps, Seated Long Arc Quad: AROM, Both, 5 reps, Seated    Shoulder Instructions       General  Comments VSS on RA, RN present at the end of the session    Pertinent Vitals/ Pain       Pain Assessment Pain Assessment: Faces Faces Pain Scale: Hurts even more Pain Location: generalized and head Pain Descriptors / Indicators: Grimacing, Moaning Pain Intervention(s): Limited activity within patient's tolerance, Monitored during session  Home Living                                          Prior Functioning/Environment              Frequency  Min 2X/week        Progress Toward Goals  OT Goals(current goals can now be found in the care plan section)  Progress towards OT goals: Progressing toward goals  Acute Rehab OT Goals Patient Stated Goal: to lay down OT Goal Formulation: With patient Time For Goal Achievement: 06/25/22 Potential to Achieve Goals: Good ADL Goals Pt Will Perform Grooming: with set-up;with supervision;standing Pt Will Perform Upper Body Bathing: with set-up;with supervision;sitting Pt Will Perform Lower Body Bathing: with set-up;with supervision;sit to/from stand Pt Will Perform Upper Body Dressing: with supervision;with set-up;sitting Pt Will Perform Lower Body Dressing: with set-up;with supervision;sit to/from stand Pt Will Transfer to Toilet: with supervision;ambulating;bedside commode Pt Will Perform Toileting - Clothing Manipulation and hygiene: with supervision;sit to/from  stand Additional ADL Goal #1: Pt will be S in and OOB for basic ADLs Additional ADL Goal #2: Pt will follow 75% of commands during session  Plan Discharge plan remains appropriate    Co-evaluation    PT/OT/SLP Co-Evaluation/Treatment: Yes Reason for Co-Treatment: Complexity of the patient's impairments (multi-system involvement);For patient/therapist safety;To address functional/ADL transfers PT goals addressed during session: Mobility/safety with mobility;Balance OT goals addressed during session: ADL's and self-care      AM-PAC OT "6 Clicks"  Daily Activity     Outcome Measure   Help from another person eating meals?: Total Help from another person taking care of personal grooming?: Total Help from another person toileting, which includes using toliet, bedpan, or urinal?: A Lot Help from another person bathing (including washing, rinsing, drying)?: A Lot Help from another person to put on and taking off regular upper body clothing?: A Lot Help from another person to put on and taking off regular lower body clothing?: Total 6 Click Score: 9    End of Session Equipment Utilized During Treatment: Gait belt  OT Visit Diagnosis: Unsteadiness on feet (R26.81);Other abnormalities of gait and mobility (R26.89);Other symptoms and signs involving cognitive function   Activity Tolerance Patient limited by lethargy   Patient Left in bed;with call bell/phone within reach;with bed alarm set;with restraints reapplied   Nurse Communication Mobility status        Time: [id number] OT Time Calculation (min): 23 min  Charges: OT Treatments $Self Care/Home Management : 8-22 mins    Namiko Pritts A Breianna Delfino 06/16/2022, 5:34 PM

## 2022-06-16 NOTE — NC FL2 (Signed)
[place] MEDICAID FL2 LEVEL OF CARE SCREENING TOOL     IDENTIFICATION  Patient Name: Jose Li Birthdate: 1955/12/11 Sex: male Admission Date (Current Location): 06/07/2022  [facility] and [place] Number:  Producer, television/film/video and Address:  The . [facility], [address], [place], [place] [address]      Provider Number: [id number]  Attending Physician Name and Address:  Julio Sicks, MD  Relative Name and Phone Number:       Current Level of Care: SNF Recommended Level of Care: Skilled Nursing Facility Prior Approval Number:    Date Approved/Denied:   PASRR Number: pending  Discharge Plan: SNF    Current Diagnoses: Patient Active Problem List   Diagnosis Date Noted   Malnutrition of moderate degree 06/10/2022   Status post craniotomy 06/08/2022   COVID-19 virus infection 04/14/2022   Anterior chest wall pain 12/06/2021   Need for immunization against influenza 09/11/2020   Pain of paraspinal muscle 01/14/2020   History of BPH 09/24/2019   Chronic kidney disease (CKD), stage III (moderate) (HCC) 07/13/2019   Varicose veins of right lower extremity with complications 01/24/2017   TBI (traumatic brain injury) (HCC)    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 [facility])    Bipolar affective disorder in remission (HCC)    Essential hypertension    Orthostasis    Seizures (HCC)    SDH (subdural hematoma) (HCC) 01/28/2016   OSA (obstructive sleep apnea) 10/01/2015   TOBACCO ABUSE 12/14/2007   Hyperlipidemia 10/09/2007   Epilepsy (HCC) 05/10/2007   BIPOLAR DISORDER 01/12/2007    Orientation RESPIRATION BLADDER Height & Weight     Self  Normal Incontinent, External catheter Weight: 164 lb 14.5 oz (74.8 kg) Height:     BEHAVIORAL SYMPTOMS/MOOD NEUROLOGICAL BOWEL NUTRITION STATUS      Incontinent Diet (See discharge summary.)  AMBULATORY STATUS COMMUNICATION OF NEEDS Skin   Extensive Assist Non-Verbally Normal                        Personal Care Assistance Level of Assistance  Bathing, Feeding, Dressing Bathing Assistance: Maximum assistance Feeding assistance: Maximum assistance Dressing Assistance: Maximum assistance     Functional Limitations Info  Sight, Hearing, Speech Sight Info: Adequate Hearing Info: Adequate Speech Info: Impaired    SPECIAL CARE FACTORS FREQUENCY  PT (By licensed PT), OT (By licensed OT)     PT Frequency: 5x/week OT Frequency: 55x/week            Contractures Contractures Info: Not present    Additional Factors Info  Code Status, Allergies, Insulin Sliding Scale Code Status Info: Full Code Allergies Info: Duloxetine, Erythromycin, Hydrochlorothiazide, Propranolol   Insulin Sliding Scale Info: Insulin Aspart 0-15 units every four hours       Current Medications (06/16/2022):  This is the current hospital active medication list Current Facility-Administered Medications  Medication Dose Route Frequency Provider Last Rate Last Admin   acetaminophen (TYLENOL) tablet 650 mg  650 mg Per Tube Q4H PRN Leander Rams, RPH   650 mg at 06/16/22 0330   Or   acetaminophen (TYLENOL) suppository 650 mg  650 mg Rectal Q4H PRN Leander Rams, RPH       atorvastatin (LIPITOR) tablet 20 mg  20 mg Per Tube Daily Leander Rams, RPH   20 mg at 06/16/22 3354   benzonatate (TESSALON) capsule 200 mg  200 mg Oral TID PRN Julio Sicks, MD  benztropine (COGENTIN) tablet 1 mg  1 mg Per Tube Daily Donnamae Jude, RPH   1 mg at 06/16/22 0913   busPIRone (BUSPAR) tablet 5 mg  5 mg Per Tube BID Donnamae Jude, RPH   5 mg at 06/16/22 [id number]   Chlorhexidine Gluconate Cloth 2 % PADS 6 each  6 each Topical Daily Earnie Larsson, MD   6 each at 06/15/22 0913   citalopram (CELEXA) tablet 20 mg  20 mg Per Tube Daily Donnamae Jude, RPH   20 mg at 06/16/22 0915   cloNIDine (CATAPRES) tablet 0.1 mg  0.1 mg Per Tube TID Earnie Larsson, MD   0.1 mg at 06/16/22 0915   docusate (COLACE) 50 MG/5ML liquid 100 mg  100 mg Per  Tube BID Donnamae Jude, RPH   100 mg at 06/14/22 [id number]   doxazosin (CARDURA) tablet 1 mg  1 mg Per Tube Daily Earnie Larsson, MD   1 mg at 06/16/22 0913   feeding supplement (OSMOLITE 1.5 CAL) liquid 1,000 mL  1,000 mL Per Tube Continuous Consuella Lose, MD 55 mL/hr at 06/16/22 0800 Infusion Verify at 06/16/22 0800   feeding supplement (PROSource TF) liquid 45 mL  45 mL Per Tube BID Consuella Lose, MD   45 mL at 06/16/22 0915   guaiFENesin (ROBITUSSIN) 100 MG/5ML liquid 10 mL  10 mL Per Tube Q6H Benetta Spar D, RPH   10 mL at 06/16/22 1134   hydrALAZINE (APRESOLINE) injection 10 mg  10 mg Intravenous Q6H PRN Consuella Lose, MD   10 mg at 06/15/22 2101   HYDROcodone-acetaminophen (NORCO/VICODIN) 5-325 MG per tablet 1 tablet  1 tablet Per Tube Q4H PRN Donnamae Jude, RPH   1 tablet at 06/16/22 [id number]   HYDROmorphone (DILAUDID) injection 0.5-1 mg  0.5-1 mg Intravenous Q2H PRN Earnie Larsson, MD   1 mg at 06/16/22 0854   insulin aspart (novoLOG) injection 0-15 Units  0-15 Units Subcutaneous Q4H Ashok Pall, MD   3 Units at 06/16/22 1133   labetalol (NORMODYNE) injection 10-40 mg  10-40 mg Intravenous Q10 min PRN Earnie Larsson, MD   40 mg at 06/16/22 0645   levETIRAcetam (KEPPRA) 100 MG/ML solution 1,500 mg  1,500 mg Per Tube BID Earnie Larsson, MD   1,500 mg at 06/16/22 0914   LORazepam (ATIVAN) injection 1-2 mg  1-2 mg Intravenous Q2H PRN Ashok Pall, MD   2 mg at 06/16/22 0138   multivitamin with minerals tablet 1 tablet  1 tablet Per Tube Daily Earnie Larsson, MD   1 tablet at 06/16/22 0915   ondansetron (ZOFRAN) tablet 4 mg  4 mg Per Tube Q4H PRN Donnamae Jude, RPH       Or   ondansetron [facility]) injection 4 mg  4 mg Intravenous Q4H PRN Donnamae Jude, [facility]       Oral care mouth rinse  15 mL Mouth Rinse 4 times per day Consuella Lose, MD   15 mL at 06/16/22 1134   Oral care mouth rinse  15 mL Mouth Rinse PRN Consuella Lose, MD       pantoprazole sodium (PROTONIX) 40 mg/20 mL oral suspension 40  mg  40 mg Per Tube Marti Sleigh, MD   40 mg at 06/15/22 2106   potassium chloride (KLOR-CON) packet 20 mEq  20 mEq Per Tube Daily Donnamae Jude, RPH   20 mEq at 06/16/22 0914   pregabalin (LYRICA) capsule 75 mg  75 mg Per Tube BID Benetta Spar  D, RPH   75 mg at 06/16/22 0914   promethazine (PHENERGAN) tablet 12.5-25 mg  12.5-25 mg Per Tube Q4H PRN Leander Rams, RPH       QUEtiapine (SEROQUEL) tablet 50 mg  50 mg Per Tube BID Julio Sicks, MD   50 mg at 06/16/22 0914   risperiDONE (RISPERDAL) tablet 2 mg  2 mg Per Tube QHS Leander Rams, RPH   2 mg at 06/15/22 2103   thiamine (VITAMIN B1) tablet 100 mg  100 mg Per Tube Daily Leander Rams, RPH   100 mg at 06/16/22 0915   traZODone (DESYREL) tablet 150 mg  150 mg Per Tube QHS Leander Rams, RPH   150 mg at 06/15/22 2106     Discharge Medications: Please see discharge summary for a list of discharge medications.  Relevant Imaging Results:  Relevant Lab Results:   Additional Information SSN: [id number]; 164 lbs  Mearl Latin, [place]

## 2022-06-16 NOTE — Progress Notes (Signed)
RE: Jose Li Date of Birth: [birth year] Date: 06/16/22  Please be advised that the above-named patient will require a short-term nursing home stay - anticipated 30 days or less for rehabilitation and strengthening.  The plan is for return home.

## 2022-06-17 LAB — BASIC METABOLIC PANEL
Anion gap: 10 (ref 5–15)
BUN: 15 mg/dL (ref 8–23)
CO2: 24 mmol/L (ref 22–32)
Calcium: 8.5 mg/dL — ABNORMAL LOW (ref 8.9–10.3)
Chloride: 96 mmol/L — ABNORMAL LOW (ref 98–111)
Creatinine, Ser: 0.78 mg/dL (ref 0.61–1.24)
GFR, Estimated: >60 mL/min (ref >60)
Glucose, Bld: 145 mg/dL — ABNORMAL HIGH (ref 70–99)
Potassium: 4.3 mmol/L (ref 3.5–5.1)
Sodium: 130 mmol/L — ABNORMAL LOW (ref 135–145)

## 2022-06-17 LAB — GLUCOSE, CAPILLARY
Glucose-Capillary: 118 mg/dL — ABNORMAL HIGH (ref 70–99)
Glucose-Capillary: 122 mg/dL — ABNORMAL HIGH (ref 70–99)
Glucose-Capillary: 133 mg/dL — ABNORMAL HIGH (ref 70–99)
Glucose-Capillary: 156 mg/dL — ABNORMAL HIGH (ref 70–99)
Glucose-Capillary: 164 mg/dL — ABNORMAL HIGH (ref 70–99)
Glucose-Capillary: 177 mg/dL — ABNORMAL HIGH (ref 70–99)

## 2022-06-17 LAB — PHOSPHORUS: Phosphorus: 4.2 mg/dL (ref 2.5–4.6)

## 2022-06-17 LAB — MAGNESIUM: Magnesium: 1.8 mg/dL (ref 1.7–2.4)

## 2022-06-17 NOTE — Care Management Important Message (Signed)
Important Message  Patient Details  Name: Jose Li MRN: [id number] Date of Birth: 01-30-1956   Medicare Important Message Given:  Yes     Dorena Bodo 06/17/2022, 2:17 PM

## 2022-06-17 NOTE — Progress Notes (Signed)
Patient removed Cortrac. Cortrac intact when discarded

## 2022-06-17 NOTE — Progress Notes (Signed)
   Providing Compassionate, Quality Care - Together   Subjective: Patient denies pain. No new issues reported overnight.  Objective: Vital signs in last 24 hours: Temp:  [98.2 F (36.8 C)-99.3 F (37.4 C)] 98.9 F (37.2 C) (08/03 0333) Pulse Rate:  [76-108] 98 (08/03 0831) Resp:  [18-24] 18 (08/03 0831) BP: (145-179)/(60-88) 175/68 (08/03 0831) SpO2:  [96 %-100 %] 96 % (08/03 0831) Weight:  [80.8 kg] 80.8 kg (08/03 0351)  Intake/Output from previous day: 08/02 0701 - 08/03 0700 In: 895.5 [I.V.:198.8; NG/GT:696.7] Out: 2370 [Urine:2370] Intake/Output this shift: No intake/output data recorded.  Pt responds to voice, somewhat uncooperative with exam Oriented to self PERRLA Speech clear MAE, restrained due to impulsivity and pulling at lines; Strength appears symmetric  Lab Results: No results for input(s): "WBC", "HGB", "HCT", "PLT" in the last 72 hours. BMET Recent Labs    06/17/22 0605  NA 130*  K 4.3  CL 96*  CO2 24  GLUCOSE 145*  BUN 15  CREATININE 0.78  CALCIUM 8.5*    Studies/Results: No results found.  Assessment/Plan: Patient underwent craniotomy for evacuation of subdural hematoma by Dr. Jordan Likes on 06/08/2022. He is slowly improving. Recommendation is for discharge to skilled nursing facility based on home situation and therapy recommendations.   LOS: 9 days   -Continue supportive care -Discharge plan is for SNF; CSW has begun this process.   Val Eagle, DNP, AGNP-C Nurse Practitioner  [facility] [address], [address], [place], [place] [address] P: [phone]    F: [phone]  06/17/2022, 10:08 AM

## 2022-06-18 ENCOUNTER — Inpatient Hospital Stay (HOSPITAL_COMMUNITY): Payer: Medicare Other

## 2022-06-18 LAB — GLUCOSE, CAPILLARY
Glucose-Capillary: 127 mg/dL — ABNORMAL HIGH (ref 70–99)
Glucose-Capillary: 105 mg/dL — ABNORMAL HIGH (ref 70–99)
Glucose-Capillary: 101 mg/dL — ABNORMAL HIGH (ref 70–99)
Glucose-Capillary: 106 mg/dL — ABNORMAL HIGH (ref 70–99)
Glucose-Capillary: 137 mg/dL — ABNORMAL HIGH (ref 70–99)
Glucose-Capillary: 157 mg/dL — ABNORMAL HIGH (ref 70–99)

## 2022-06-18 MED ORDER — NICOTINE 7 MG/24HR TD PT24
7.0000 mg | MEDICATED_PATCH | Freq: Every day | TRANSDERMAL | Status: DC
  Administered 2022-06-18 – 2022-07-06 (×19): 7 mg via TRANSDERMAL
  Filled 2022-06-18 (×19): qty 1

## 2022-06-18 NOTE — NC FL2 (Signed)
[place] LEVEL OF CARE SCREENING TOOL     IDENTIFICATION  Patient Name: Jose Li Birthdate: [birth year] Sex: male Admission Date (Current Location): 06/07/2022  [facility] and [place] Number:  Herbalist and Address:  [facility], [place] [address], [place], [place] [address]      Provider Number: [id number]  Attending Physician Name and Address:  Earnie Larsson, MD  Relative Name and Phone Number:       Current Level of Care: Hospital Recommended Level of Care: [place] Prior Approval Number:    Date Approved/Denied:   PASRR Number: pending  Discharge Plan: SNF    Current Diagnoses: Patient Active Problem List   Diagnosis Date Noted   Malnutrition of moderate degree 06/10/2022   Status post craniotomy 06/08/2022   COVID-19 virus infection 04/14/2022   Anterior chest wall pain 12/06/2021   Need for immunization against influenza 09/11/2020   Pain of paraspinal muscle 01/14/2020   History of BPH 09/24/2019   Chronic kidney disease (CKD), stage III (moderate) ([place]) 07/13/2019   Varicose veins of right lower extremity with complications 0000000   TBI (traumatic brain injury) ([place])    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 [facility])    Bipolar affective disorder in remission ([place])    Essential hypertension    Orthostasis    Seizures ([place])    SDH (subdural hematoma) ([place]) 01/28/2016   OSA (obstructive sleep apnea) 10/01/2015   TOBACCO ABUSE 12/14/2007   Hyperlipidemia 10/09/2007   Epilepsy ([place]) 05/10/2007   BIPOLAR DISORDER 01/12/2007    Orientation RESPIRATION BLADDER Height & Weight     Self  Normal Incontinent, External catheter Weight: 170 lb 6.7 oz (77.3 kg) Height:     BEHAVIORAL SYMPTOMS/MOOD NEUROLOGICAL BOWEL NUTRITION STATUS      Incontinent Diet (See discharge summary.)  AMBULATORY STATUS COMMUNICATION OF NEEDS Skin   Extensive Assist Non-Verbally Normal                        Personal Care Assistance Level of Assistance  Bathing, Feeding, Dressing Bathing Assistance: Maximum assistance Feeding assistance: Maximum assistance Dressing Assistance: Maximum assistance     Functional Limitations Info  Sight, Hearing, Speech Sight Info: Adequate Hearing Info: Adequate Speech Info: Impaired    SPECIAL CARE FACTORS FREQUENCY  PT (By licensed PT), OT (By licensed OT)     PT Frequency: 5x/week OT Frequency: 55x/week            Contractures Contractures Info: Not present    Additional Factors Info  Code Status, Allergies, Insulin Sliding Scale Code Status Info: Full Code Allergies Info: Duloxetine, Erythromycin, Hydrochlorothiazide, Propranolol   Insulin Sliding Scale Info: Insulin Aspart 0-15 units every four hours       Current Medications (06/18/2022):  This is the current hospital active medication list Current Facility-Administered Medications  Medication Dose Route Frequency Provider Last Rate Last Admin   acetaminophen (TYLENOL) tablet 650 mg  650 mg Per Tube Q4H PRN Donnamae Jude, RPH   650 mg at 06/16/22 0330   Or   acetaminophen (TYLENOL) suppository 650 mg  650 mg Rectal Q4H PRN Donnamae Jude, RPH       atorvastatin (LIPITOR) tablet 20 mg  20 mg Per Tube Daily Donnamae Jude, RPH   20 mg at 06/17/22 [id number]   benzonatate (TESSALON) capsule 200 mg  200 mg Oral TID PRN Earnie Larsson, MD  benztropine (COGENTIN) tablet 1 mg  1 mg Per Tube Daily Leander Rams, RPH   1 mg at 06/17/22 9379   busPIRone (BUSPAR) tablet 5 mg  5 mg Per Tube BID Leander Rams, RPH   5 mg at 06/17/22 0932   Chlorhexidine Gluconate Cloth 2 % PADS 6 each  6 each Topical Daily Julio Sicks, MD   6 each at 06/17/22 0933   citalopram (CELEXA) tablet 20 mg  20 mg Per Tube Daily Leander Rams, RPH   20 mg at 06/17/22 0240   cloNIDine (CATAPRES) tablet 0.1 mg  0.1 mg Per Tube TID Julio Sicks, MD   0.1 mg at 06/17/22 1634   docusate (COLACE) 50 MG/5ML liquid 100 mg  100 mg Per  Tube BID Leander Rams, RPH   100 mg at 06/17/22 0932   doxazosin (CARDURA) tablet 1 mg  1 mg Per Tube Daily Julio Sicks, MD   1 mg at 06/17/22 9735   feeding supplement (OSMOLITE 1.5 CAL) liquid 1,000 mL  1,000 mL Per Tube Continuous Lisbeth Renshaw, MD   Stopped at 06/17/22 1808   feeding supplement (PROSource TF) liquid 45 mL  45 mL Per Tube BID Lisbeth Renshaw, MD   45 mL at 06/17/22 0938   guaiFENesin (ROBITUSSIN) 100 MG/5ML liquid 10 mL  10 mL Per Tube Q6H Alphia Moh D, RPH   10 mL at 06/17/22 1740   hydrALAZINE (APRESOLINE) injection 10 mg  10 mg Intravenous Q6H PRN Lisbeth Renshaw, MD   10 mg at 06/17/22 2139   HYDROcodone-acetaminophen (NORCO/VICODIN) 5-325 MG per tablet 1 tablet  1 tablet Per Tube Q4H PRN Leander Rams, RPH   1 tablet at 06/17/22 0932   HYDROmorphone (DILAUDID) injection 0.5-1 mg  0.5-1 mg Intravenous Q2H PRN Julio Sicks, MD   1 mg at 06/17/22 2143   insulin aspart (novoLOG) injection 0-15 Units  0-15 Units Subcutaneous Q4H Coletta Memos, MD   2 Units at 06/18/22 0443   labetalol (NORMODYNE) injection 10-40 mg  10-40 mg Intravenous Q10 min PRN Julio Sicks, MD   20 mg at 06/17/22 0339   levETIRAcetam (KEPPRA) 100 MG/ML solution 1,500 mg  1,500 mg Per Tube BID Julio Sicks, MD   1,500 mg at 06/17/22 0932   LORazepam (ATIVAN) injection 1-2 mg  1-2 mg Intravenous Q2H PRN Coletta Memos, MD   2 mg at 06/16/22 0138   multivitamin with minerals tablet 1 tablet  1 tablet Per Tube Daily Julio Sicks, MD   1 tablet at 06/17/22 0932   ondansetron (ZOFRAN) tablet 4 mg  4 mg Per Tube Q4H PRN Leander Rams, RPH       Or   ondansetron [facility]) injection 4 mg  4 mg Intravenous Q4H PRN Leander Rams, [facility]       Oral care mouth rinse  15 mL Mouth Rinse 4 times per day Lisbeth Renshaw, MD   15 mL at 06/17/22 2145   Oral care mouth rinse  15 mL Mouth Rinse PRN Lisbeth Renshaw, MD       pantoprazole sodium (PROTONIX) 40 mg/20 mL oral suspension 40 mg  40 mg Per Tube Annita Brod, MD   40 mg at 06/16/22 2206   potassium chloride (KLOR-CON) packet 20 mEq  20 mEq Per Tube Daily Leander Rams, RPH   20 mEq at 06/17/22 0932   pregabalin (LYRICA) capsule 75 mg  75 mg Per Tube BID Leander Rams, [facility]  75 mg at 06/17/22 0932   promethazine (PHENERGAN) tablet 12.5-25 mg  12.5-25 mg Per Tube Q4H PRN Leander Rams, RPH       QUEtiapine (SEROQUEL) tablet 50 mg  50 mg Per Tube BID Julio Sicks, MD   50 mg at 06/17/22 0933   risperiDONE (RISPERDAL) tablet 2 mg  2 mg Per Tube QHS Leander Rams, RPH   2 mg at 06/16/22 2206   thiamine (VITAMIN B1) tablet 100 mg  100 mg Per Tube Daily Leander Rams, RPH   100 mg at 06/17/22 0933   traZODone (DESYREL) tablet 150 mg  150 mg Per Tube QHS Leander Rams, RPH   150 mg at 06/16/22 2206     Discharge Medications: Please see discharge summary for a list of discharge medications.  Relevant Imaging Results:  Relevant Lab Results:   Additional Information SSN: [id number]; 164 lbs  Tennis Must [place], [place]

## 2022-06-18 NOTE — Progress Notes (Signed)
Occupational Therapy Treatment Patient Details Name: Jose Li MRN: [id number] DOB: 15-May-1956 Today's Date: 06/18/2022   History of present illness Pt is 66 year old male found 06/07/22 on ground at home during wellness check  and admitted with subdural hematoma. BZ:JIRCV acute hemispheric subdural hematoma on the left with mass effect. 7/25: Left frontotemporoparietal craniotomy and evacuation of acute posttraumatic subdural hematoma, placement of subdural drain(has been pulled). PHMx: seizure disorder, alcohol disorder.   OT comments  This 66 yo male seen today in conjunction with PT to see if we could progress his mobility and we were able to. He was able to ambulate short distances with Bil HHA, side step with one person HHA and multiple cues. He was able to wash his face while seated EOB, and worked on donning his socks. He will continue to benefit from acute OT with follow up at SNF.   Recommendations for follow up therapy are one component of a multi-disciplinary discharge planning process, led by the attending physician.  Recommendations may be updated based on patient status, additional functional criteria and insurance authorization.    Follow Up Recommendations  Skilled nursing-short term rehab (<3 hours/day)    Assistance Recommended at Discharge Frequent or constant Supervision/Assistance  Patient can return home with the following  Two people to help with walking and/or transfers;A lot of help with bathing/dressing/bathroom;Assistance with cooking/housework;Assistance with feeding;Help with stairs or ramp for entrance;Assist for transportation;Direct supervision/assist for financial management;Direct supervision/assist for medications management   Equipment Recommendations  Other (comment) (TBD next venue)       Precautions / Restrictions Precautions Precautions: Fall Precaution Comments: bil mittens; posey belt; bil wrist restraints, cortrak Restrictions Weight Bearing  Restrictions: No       Mobility Bed Mobility Overal bed mobility: Needs Assistance Bed Mobility: Supine to Sit, Sit to Supine     Supine to sit: Mod assist Sit to supine: Min guard   General bed mobility comments: Up to sit: HOB up, VC and tactile cues  and min A to move legs over side of bed (initially he just scooted up towards [facility]), Min A with trunk up to sit, increased time to scoot to EOB as well as verbal and gestural cues. Sit>supine: A to move legs to the more center of the bed.    Transfers Overall transfer level: Needs assistance Equipment used: 2 person hand held assist Transfers: Sit to/from Stand Sit to Stand: Min assist, +2 safety/equipment           General transfer comment: Pt had a difficult time stepping backwards and to the side (cognitively understanding)     Balance Overall balance assessment: Needs assistance Sitting-balance support: No upper extremity supported, Feet supported Sitting balance-Leahy Scale: Good     Standing balance support: Bilateral upper extremity supported Standing balance-Leahy Scale: Poor                             ADL either performed or assessed with clinical judgement   ADL Overall ADL's : Needs assistance/impaired     Grooming: Wash/dry face;Min guard;Sitting Grooming Details (indicate cue type and reason): EOB             Lower Body Dressing: Maximal assistance Lower Body Dressing Details (indicate cue type and reason): for socks will seated EOB. Could not get pt to doff socks on  his own. Once I doffed them for him he attempted to donn them himself but could not reach his  toes. I started them over his toes to his mid forefoot and he finished the rest. Toilet Transfer: Moderate assistance;+2 for safety/equipment;Ambulation Toilet Transfer Details (indicate cue type and reason): Bil HHA, simulated bed with ~8 steps forward then turning around to walk back to bed; as well as walking around the bed to  other side (multiple verbal, gestural, and tactile cues)                Extremity/Trunk Assessment Upper Extremity Assessment Upper Extremity Assessment: Generalized weakness            Vision Baseline Vision/History: 1 Wears glasses (reading only)            Cognition Arousal/Alertness: Awake/alert Behavior During Therapy: Flat affect Overall Cognitive Status: Impaired/Different from baseline Area of Impairment: Orientation, Attention, Following commands, Safety/judgement, Awareness, Problem solving                 Orientation Level: Disoriented to, Place, Time Current Attention Level: Sustained   Following Commands: Follows one step commands inconsistently, Follows one step commands with increased time Safety/Judgement: Decreased awareness of safety, Decreased awareness of deficits Awareness: Intellectual Problem Solving: Slow processing, Decreased initiation, Difficulty sequencing, Requires verbal cues, Requires tactile cues                     Pertinent Vitals/ Pain       Pain Assessment Pain Assessment: No/denies pain         Frequency  Min 2X/week        Progress Toward Goals  OT Goals(current goals can now be found in the care plan section)  Progress towards OT goals: Progressing toward goals  Acute Rehab OT Goals Patient Stated Goal: agreeable to get up and work with therapy OT Goal Formulation: With patient/family Time For Goal Achievement: 06/25/22 Potential to Achieve Goals: Good  Plan Discharge plan needs to be updated    Co-evaluation    PT/OT/SLP Co-Evaluation/Treatment: Yes Reason for Co-Treatment: For patient/therapist safety;To address functional/ADL transfers PT goals addressed during session: Mobility/safety with mobility;Balance;Strengthening/ROM OT goals addressed during session: Strengthening/ROM;ADL's and self-care      AM-PAC OT "6 Clicks" Daily Activity     Outcome Measure   Help from another person  eating meals?: Total (NPO) Help from another person taking care of personal grooming?: A Lot Help from another person toileting, which includes using toliet, bedpan, or urinal?: A Lot Help from another person bathing (including washing, rinsing, drying)?: A Lot Help from another person to put on and taking off regular upper body clothing?: A Lot Help from another person to put on and taking off regular lower body clothing?: A Lot 6 Click Score: 11    End of Session Equipment Utilized During Treatment: Gait belt  OT Visit Diagnosis: Unsteadiness on feet (R26.81);Other abnormalities of gait and mobility (R26.89);Other symptoms and signs involving cognitive function   Activity Tolerance Patient tolerated treatment well   Patient Left in bed;with call bell/phone within reach;with bed alarm set;with family/visitor present           Time: 1330-1401 OT Time Calculation (min): 31 min  Charges: OT General Charges $OT Visit: 1 Visit OT Treatments $Self Care/Home Management : 8-22 mins  Ignacia Palma, OTR/L Acute Rehab [facility] [phone] Office [phone]    Evette Georges 06/18/2022, 3:13 PM

## 2022-06-18 NOTE — Progress Notes (Signed)
Physical Therapy Treatment Patient Details Name: Jose Li MRN: [id number] DOB: 1956/01/16 Today's Date: 06/18/2022   History of Present Illness Pt is 66 year old male found 06/07/22 on ground at home during wellness check  and admitted with subdural hematoma. [id number]:[id number] acute hemispheric subdural hematoma on the left with mass effect. 7/25: Left frontotemporoparietal craniotomy and evacuation of acute posttraumatic subdural hematoma, placement of subdural drain (has been pulled). Pt pulled out Cortrak evening of 8/3.  PHMx: seizure disorder, alcohol disorder.    PT Comments    Co-tx completed with OT this date. Tolerated increased mobility challenges including short distance ambulation in room, side stepping with 1+ assist and max VC throughout. Unable to step backwards appropriately. NMRE for balance and righting reactions. Improved tolerance with sit<>stand transfers +2, progressing to +1 assist. Answering some questions appropriately today. Patient will continue to benefit from skilled physical therapy services to further improve independence with functional mobility.    Recommendations for follow up therapy are one component of a multi-disciplinary discharge planning process, led by the attending physician.  Recommendations may be updated based on patient status, additional functional criteria and insurance authorization.  Follow Up Recommendations  Skilled nursing-short term rehab (<3 hours/day)     Assistance Recommended at Discharge Frequent or constant Supervision/Assistance  Patient can return home with the following Assistance with cooking/housework;Direct supervision/assist for medications management;Direct supervision/assist for financial management;Assist for transportation;Help with stairs or ramp for entrance;Two people to help with walking and/or transfers;Two people to help with bathing/dressing/bathroom   Equipment Recommendations   (TBD next venue of care)     Recommendations for Other Services       Precautions / Restrictions Precautions Precautions: Fall Precaution Comments: bil mittens; posey belt; bil wrist restraints, Restrictions Weight Bearing Restrictions: No     Mobility  Bed Mobility Overal bed mobility: Needs Assistance Bed Mobility: Supine to Sit, Sit to Supine     Supine to sit: Mod assist Sit to supine: Min guard   General bed mobility comments: Up to sit: HOB up, VC and tactile cues  and min A to move legs over side of bed (initially he just scooted up towards [facility]), Min A with trunk up to sit, increased time to scoot to EOB as well as verbal and gestural cues. Sit>supine: A to move legs to the more center of the bed. Requires facilitation with verbal and tactile cues intermittently.    Transfers Overall transfer level: Needs assistance Equipment used: 2 person hand held assist Transfers: Sit to/from Stand Sit to Stand: Min assist, +2 safety/equipment           General transfer comment: Pt had a difficult time stepping backwards and to the side (cognitively understanding.) Practiced several times from bed, progressed to single person bil hand held assist to facilitate movement.    Ambulation/Gait Ambulation/Gait assistance: Min assist, +2 physical assistance, +2 safety/equipment Gait Distance (Feet): 8 Feet (x2) Assistive device: 2 person hand held assist, 1 person hand held assist Gait Pattern/deviations: Step-through pattern, Decreased stride length, Narrow base of support Gait velocity: reduced Gait velocity interpretation: <1.31 ft/sec, indicative of household ambulator   General Gait Details: Min assist +2 with VC and facilitation, intermittent balance support. No overt buckling or sudden LOB. Shows some posterior instability as well. Practiced walking forward (unable to step backwards adeqautely,) and lateral steps along edge of bed. Tolerated several turns in room and walked around bed with min assist +2  for safety while PT held both of patient's hands to  lead forward facing therapist directly. Cues for gaze.   Stairs             Wheelchair Mobility    Modified Rankin (Stroke Patients Only) Modified Rankin (Stroke Patients Only) Pre-Morbid Rankin Score: No symptoms Modified Rankin: Moderately severe disability     Balance           Standing balance support: Single extremity supported   Standing balance comment: Able to stand with light UE support.                            Cognition Arousal/Alertness: Awake/alert Behavior During Therapy: Flat affect Overall Cognitive Status: Impaired/Different from baseline Area of Impairment: Orientation, Attention, Following commands, Safety/judgement, Awareness, Problem solving                 Orientation Level: Disoriented to, Place, Time Current Attention Level: Sustained   Following Commands: Follows one step commands inconsistently, Follows one step commands with increased time Safety/Judgement: Decreased awareness of safety, Decreased awareness of deficits Awareness: Intellectual Problem Solving: Slow processing, Decreased initiation, Difficulty sequencing, Requires verbal cues, Requires tactile cues          Exercises Other Exercises Other Exercises: NMRE for balance and stability - performed standing with bil UE support from therapist, completed heel raises and gradual A-P rocking (unable to fully dorsiflex ankles in standing,) latearl weight shift, and march in place (continues for a while after instructing to stop marching.)    General Comments General comments (skin integrity, edema, etc.): HR 113 at rest, elevated to 171 at max with short distance ambulation in room. Improved upon sitting and resting but quickly elevates again with transfer.      Pertinent Vitals/Pain Pain Assessment Pain Assessment: No/denies pain Pain Intervention(s): Monitored during session, Repositioned    Home Living                           Prior Function            PT Goals (current goals can now be found in the care plan section) Acute Rehab PT Goals Patient Stated Goal: lay down PT Goal Formulation: Patient unable to participate in goal setting Time For Goal Achievement: 06/25/22 Potential to Achieve Goals: Good Progress towards PT goals: Progressing toward goals    Frequency    Min 3X/week      PT Plan Current plan remains appropriate    Co-evaluation PT/OT/SLP Co-Evaluation/Treatment: Yes Reason for Co-Treatment: Necessary to address cognition/behavior during functional activity;For patient/therapist safety;To address functional/ADL transfers PT goals addressed during session: Mobility/safety with mobility OT goals addressed during session: Strengthening/ROM;ADL's and self-care      AM-PAC PT "6 Clicks" Mobility   Outcome Measure  Help needed turning from your back to your side while in a flat bed without using bedrails?: A Lot Help needed moving from lying on your back to sitting on the side of a flat bed without using bedrails?: A Lot Help needed moving to and from a bed to a chair (including a wheelchair)?: A Lot Help needed standing up from a chair using your arms (e.g., wheelchair or bedside chair)?: A Little Help needed to walk in hospital room?: A Lot Help needed climbing 3-5 steps with a railing? : Total 6 Click Score: 12    End of Session Equipment Utilized During Treatment: Gait belt Activity Tolerance: Patient tolerated treatment well Patient left: in bed;with call  bell/phone within reach;with bed alarm set;with restraints reapplied;with family/visitor present;with SCD's reapplied   PT Visit Diagnosis: Unsteadiness on feet (R26.81);Other abnormalities of gait and mobility (R26.89);Muscle weakness (generalized) (M62.81);Difficulty in walking, not elsewhere classified (R26.2);Dizziness and giddiness (R42)     Time: 5621-3086 PT Time Calculation (min)  (ACUTE ONLY): 29 min  Charges:  $Gait Training: 8-22 mins                     Kathlyn Sacramento, PT    Berton Mount 06/18/2022, 3:41 PM

## 2022-06-18 NOTE — Progress Notes (Signed)
   Providing Compassionate, Quality Care - Together   Subjective: Nurse reports the patient pulled his Cortrak out yesterday and was unable to receive medications until it was replaced around noon today. He is fairly sedated during this exam.  Objective: Vital signs in last 24 hours: Temp:  [98.1 F (36.7 C)-99 F (37.2 C)] 99 F (37.2 C) (08/04 1152) Pulse Rate:  [99-110] 106 (08/04 1152) Resp:  [18-19] 18 (08/04 1152) BP: (154-178)/(60-84) 154/75 (08/04 1152) SpO2:  [96 %-99 %] 98 % (08/04 1152) Weight:  [77.3 kg] 77.3 kg (08/04 0445)  Intake/Output from previous day: 08/03 0701 - 08/04 0700 In: 0  Out: 1050 [Urine:1050] Intake/Output this shift: No intake/output data recorded.  Responds to voice, will not answer questions PERRLA MAE, restrained due to impulsivity and pulling at lines; Strength appears symmetric Incision is clean, dry, and intact; closed with staples   Lab Results: No results for input(s): "WBC", "HGB", "HCT", "PLT" in the last 72 hours. BMET Recent Labs    06/17/22 0605  NA 130*  K 4.3  CL 96*  CO2 24  GLUCOSE 145*  BUN 15  CREATININE 0.78  CALCIUM 8.5*    Studies/Results: DG Abd Portable 1V  Result Date: 06/18/2022 CLINICAL DATA:  Feeding tube placement EXAM: PORTABLE ABDOMEN - 1 VIEW COMPARISON:  None Available. FINDINGS: Non weighted enteric feeding tube is positioned with tip below the diaphragm, in the vicinity of the pylorus or duodenal bulb. Nonobstructive pattern of included bowel gas. No free air. IMPRESSION: Non weighted enteric feeding tube is positioned with tip below the diaphragm, in the vicinity of the pylorus or duodenal bulb. Consider advancement if post pyloric positioning is desired. Electronically Signed   By: Jearld Lesch M.D.   On: 06/18/2022 12:22    Assessment/Plan: Patient underwent craniotomy for evacuation of subdural hematoma by Dr. Jordan Likes on 06/08/2022. He is slowly improving. Recommendation is for discharge to  skilled nursing facility based on home situation and therapy recommendations.   LOS: 10 days   -Continue supportive care -Discharge plan is for SNF; CSW has begun this process. -Remove staples   Val Eagle, DNP, AGNP-C Nurse Practitioner  [facility] [address], [place], [place] [address] P: [phone]    F: [phone]  06/18/2022, 3:01 PM

## 2022-06-18 NOTE — Procedures (Signed)
Cortrak  Person Inserting Tube:  Chenee Munns L, RD Tube Type:  Cortrak - 43 inches Tube Size:  10 Tube Location:  Left nare Secured by: Bridle Technique Used to Measure Tube Placement:  Marking at nare/corner of mouth Cortrak Secured At:  68 cm   Cortrak Tube Team Note:  Consult received to place a Cortrak feeding tube.   X-ray is required, abdominal x-ray has been ordered by the Cortrak team. Please confirm tube placement before using the Cortrak tube.   If the tube becomes dislodged please keep the tube and contact the Cortrak team at [email or web address] (password [email or web address]) for replacement.  If after hours and replacement cannot be delayed, place a NG tube and confirm placement with an abdominal x-ray.    Laraine Samet RD, LDN Clinical Dietitian See AMiON for contact information.    

## 2022-06-19 LAB — GLUCOSE, CAPILLARY
Glucose-Capillary: 123 mg/dL — ABNORMAL HIGH (ref 70–99)
Glucose-Capillary: 133 mg/dL — ABNORMAL HIGH (ref 70–99)
Glucose-Capillary: 147 mg/dL — ABNORMAL HIGH (ref 70–99)
Glucose-Capillary: 169 mg/dL — ABNORMAL HIGH (ref 70–99)
Glucose-Capillary: 169 mg/dL — ABNORMAL HIGH (ref 70–99)
Glucose-Capillary: 172 mg/dL — ABNORMAL HIGH (ref 70–99)

## 2022-06-19 NOTE — Progress Notes (Signed)
   Providing Compassionate, Quality Care - Together   Subjective: Patient is relaxed in bed. He has just been bathed by the nurse tech.  Objective: Vital signs in last 24 hours: Temp:  [97.6 F (36.4 C)-99.2 F (37.3 C)] 98.6 F (37 C) (08/05 0730) Pulse Rate:  [102-109] 103 (08/05 0730) Resp:  [17-20] 19 (08/05 0730) BP: (87-156)/(63-102) 155/68 (08/05 0730) SpO2:  [96 %-98 %] 97 % (08/05 0730) Weight:  [74.4 kg] 74.4 kg (08/05 0600)  Intake/Output from previous day: 08/04 0701 - 08/05 0700 In: -  Out: 400 [Urine:400] Intake/Output this shift: No intake/output data recorded.  Pt alert Oriented to self PERRLA Speech clear MAE, restrained due to impulsivity and pulling at lines; Strength appears symmetric Incision is dry and intact, with some scabbing. Staples were removed yesterday.  Lab Results: No results for input(s): "WBC", "HGB", "HCT", "PLT" in the last 72 hours. BMET Recent Labs    06/17/22 0605  NA 130*  K 4.3  CL 96*  CO2 24  GLUCOSE 145*  BUN 15  CREATININE 0.78  CALCIUM 8.5*    Studies/Results: DG Abd Portable 1V  Result Date: 06/18/2022 CLINICAL DATA:  Feeding tube placement EXAM: PORTABLE ABDOMEN - 1 VIEW COMPARISON:  None Available. FINDINGS: Non weighted enteric feeding tube is positioned with tip below the diaphragm, in the vicinity of the pylorus or duodenal bulb. Nonobstructive pattern of included bowel gas. No free air. IMPRESSION: Non weighted enteric feeding tube is positioned with tip below the diaphragm, in the vicinity of the pylorus or duodenal bulb. Consider advancement if post pyloric positioning is desired. Electronically Signed   By: Jearld Lesch M.D.   On: 06/18/2022 12:22    Assessment/Plan: Assessment/Plan: Patient underwent craniotomy for evacuation of subdural hematoma by Dr. Jordan Likes on 06/08/2022. He is slowly improving. Recommendation is for discharge to skilled nursing facility based on home situation and therapy  recommendations.    LOS: 11 days   -Continue supportive care -Discharge plan is for SNF; CSW has begun this process.   Val Eagle, DNP, AGNP-C Nurse Practitioner  [facility] [address], [address], [place], [place] [address] P: [phone]    F: [phone]  06/19/2022, 10:49 AM

## 2022-06-20 LAB — GLUCOSE, CAPILLARY
Glucose-Capillary: 158 mg/dL — ABNORMAL HIGH (ref 70–99)
Glucose-Capillary: 172 mg/dL — ABNORMAL HIGH (ref 70–99)
Glucose-Capillary: 154 mg/dL — ABNORMAL HIGH (ref 70–99)
Glucose-Capillary: 118 mg/dL — ABNORMAL HIGH (ref 70–99)
Glucose-Capillary: 147 mg/dL — ABNORMAL HIGH (ref 70–99)

## 2022-06-20 NOTE — Progress Notes (Signed)
   Providing Compassionate, Quality Care - Together   Subjective: Patient reports no new issues.  Objective: Vital signs in last 24 hours: Temp:  [97.8 F (36.6 C)-98.9 F (37.2 C)] 97.8 F (36.6 C) (08/06 0350) Pulse Rate:  [90-99] 93 (08/06 0350) Resp:  [16-19] 19 (08/06 0350) BP: (127-158)/(60-75) 149/75 (08/06 0350) SpO2:  [96 %-99 %] 98 % (08/06 0350) Weight:  [73.2 kg-73.8 kg] 73.2 kg (08/06 0519)  Intake/Output from previous day: 08/05 0701 - 08/06 0700 In: 440 [NG/GT:440] Out: 1150 [Urine:1150] Intake/Output this shift: No intake/output data recorded.  Pt alert Oriented to self and that he's in the hospital (states [place]) PERRLA Speech clear MAE, restrained due to impulsivity and pulling at lines; Strength appears symmetric Incision is dry and intact, with some scabbing.    Lab Results: No results for input(s): "WBC", "HGB", "HCT", "PLT" in the last 72 hours. BMET No results for input(s): "NA", "K", "CL", "CO2", "GLUCOSE", "BUN", "CREATININE", "CALCIUM" in the last 72 hours.  Studies/Results: DG Abd Portable 1V  Result Date: 06/18/2022 CLINICAL DATA:  Feeding tube placement EXAM: PORTABLE ABDOMEN - 1 VIEW COMPARISON:  None Available. FINDINGS: Non weighted enteric feeding tube is positioned with tip below the diaphragm, in the vicinity of the pylorus or duodenal bulb. Nonobstructive pattern of included bowel gas. No free air. IMPRESSION: Non weighted enteric feeding tube is positioned with tip below the diaphragm, in the vicinity of the pylorus or duodenal bulb. Consider advancement if post pyloric positioning is desired. Electronically Signed   By: Jearld Lesch M.D.   On: 06/18/2022 12:22    Assessment/Plan: Patient underwent craniotomy for evacuation of subdural hematoma by Dr. Jordan Likes on 06/08/2022. He is slowly improving. Recommendation is for discharge to skilled nursing facility based on home situation and therapy recommendations.   LOS: 12 days     -Continue supportive care -Discharge plan is for SNF; CSW has begun this process.   Val Eagle, DNP, AGNP-C Nurse Practitioner  [facility] [address], [place], [place] [address] P: [phone]    F: [phone]  06/20/2022, 9:07 AM

## 2022-06-21 DIAGNOSIS — L8990 Pressure ulcer of unspecified site, unspecified stage: Secondary | ICD-10-CM | POA: Insufficient documentation

## 2022-06-21 LAB — GLUCOSE, CAPILLARY
Glucose-Capillary: 136 mg/dL — ABNORMAL HIGH (ref 70–99)
Glucose-Capillary: 137 mg/dL — ABNORMAL HIGH (ref 70–99)
Glucose-Capillary: 139 mg/dL — ABNORMAL HIGH (ref 70–99)
Glucose-Capillary: 142 mg/dL — ABNORMAL HIGH (ref 70–99)
Glucose-Capillary: 146 mg/dL — ABNORMAL HIGH (ref 70–99)
Glucose-Capillary: 148 mg/dL — ABNORMAL HIGH (ref 70–99)

## 2022-06-21 MED ORDER — LEVETIRACETAM 100 MG/ML PO SOLN
1500.0000 mg | Freq: Two times a day (BID) | ORAL | Status: DC
  Administered 2022-06-21 – 2022-07-06 (×30): 1500 mg via ORAL
  Filled 2022-06-21 (×32): qty 15

## 2022-06-21 MED ORDER — RISPERIDONE 0.5 MG PO TABS
2.0000 mg | ORAL_TABLET | Freq: Every day | ORAL | Status: DC
Start: 2022-06-21 — End: 2022-07-06
  Administered 2022-06-21 – 2022-07-05 (×15): 2 mg via ORAL
  Filled 2022-06-21 (×16): qty 4

## 2022-06-21 MED ORDER — ONDANSETRON HCL 4 MG/2ML IJ SOLN: 4.0000 mg | INTRAMUSCULAR | Status: DC | PRN

## 2022-06-21 MED ORDER — HYDROCODONE-ACETAMINOPHEN 5-325 MG PO TABS
1.0000 | ORAL_TABLET | ORAL | Status: DC | PRN
  Administered 2022-06-21 – 2022-07-05 (×8): 1 via ORAL
  Filled 2022-06-21 (×8): qty 1

## 2022-06-21 MED ORDER — CLONIDINE HCL 0.1 MG PO TABS
0.1000 mg | ORAL_TABLET | Freq: Three times a day (TID) | ORAL | Status: DC
  Administered 2022-06-21 – 2022-07-06 (×46): 0.1 mg via ORAL
  Filled 2022-06-21 (×47): qty 1

## 2022-06-21 MED ORDER — DOCUSATE SODIUM 50 MG/5ML PO LIQD
100.0000 mg | Freq: Two times a day (BID) | ORAL | Status: DC
  Administered 2022-06-21 – 2022-07-06 (×29): 100 mg via ORAL
  Filled 2022-06-21 (×33): qty 10

## 2022-06-21 MED ORDER — ACETAMINOPHEN 650 MG RE SUPP: 650.0000 mg | RECTAL | Status: DC | PRN

## 2022-06-21 MED ORDER — PREGABALIN 50 MG PO CAPS
75.0000 mg | ORAL_CAPSULE | Freq: Two times a day (BID) | ORAL | Status: DC
  Administered 2022-06-21 – 2022-07-06 (×30): 75 mg via ORAL
  Filled 2022-06-21 (×32): qty 1

## 2022-06-21 MED ORDER — PROMETHAZINE HCL 25 MG PO TABS: 12.5000 mg | ORAL_TABLET | ORAL | Status: DC | PRN

## 2022-06-21 MED ORDER — BENZTROPINE MESYLATE 1 MG PO TABS
1.0000 mg | ORAL_TABLET | Freq: Every day | ORAL | Status: DC
  Administered 2022-06-22 – 2022-07-06 (×15): 1 mg via ORAL
  Filled 2022-06-21 (×15): qty 1

## 2022-06-21 MED ORDER — BUSPIRONE HCL 10 MG PO TABS
5.0000 mg | ORAL_TABLET | Freq: Two times a day (BID) | ORAL | Status: DC
  Administered 2022-06-21 – 2022-07-06 (×30): 5 mg via ORAL
  Filled 2022-06-21 (×31): qty 1

## 2022-06-21 MED ORDER — TRAZODONE HCL 50 MG PO TABS
150.0000 mg | ORAL_TABLET | Freq: Every day | ORAL | Status: DC
  Administered 2022-06-21 – 2022-07-05 (×15): 150 mg via ORAL
  Filled 2022-06-21 (×17): qty 1

## 2022-06-21 MED ORDER — ENSURE ENLIVE PO LIQD
237.0000 mL | Freq: Two times a day (BID) | ORAL | Status: DC
  Administered 2022-06-21 – 2022-06-28 (×14): 237 mL via ORAL

## 2022-06-21 MED ORDER — DOXAZOSIN MESYLATE 1 MG PO TABS
1.0000 mg | ORAL_TABLET | Freq: Every day | ORAL | Status: DC
  Administered 2022-06-22 – 2022-07-06 (×15): 1 mg via ORAL
  Filled 2022-06-21 (×15): qty 1

## 2022-06-21 MED ORDER — GUAIFENESIN 100 MG/5ML PO LIQD
10.0000 mL | Freq: Four times a day (QID) | ORAL | Status: DC
Start: 2022-06-21 — End: 2022-07-06
  Administered 2022-06-21 – 2022-07-06 (×55): 10 mL via ORAL
  Filled 2022-06-21 (×58): qty 10

## 2022-06-21 MED ORDER — THIAMINE HCL 100 MG PO TABS
100.0000 mg | ORAL_TABLET | Freq: Every day | ORAL | Status: DC
  Administered 2022-06-22 – 2022-07-06 (×15): 100 mg via ORAL
  Filled 2022-06-21 (×15): qty 1

## 2022-06-21 MED ORDER — ATORVASTATIN CALCIUM 10 MG PO TABS
20.0000 mg | ORAL_TABLET | Freq: Every day | ORAL | Status: DC
  Administered 2022-06-22 – 2022-07-06 (×15): 20 mg via ORAL
  Filled 2022-06-21 (×15): qty 2

## 2022-06-21 MED ORDER — POTASSIUM CHLORIDE 20 MEQ PO PACK
20.0000 meq | PACK | Freq: Every day | ORAL | Status: DC
  Administered 2022-06-22 – 2022-07-06 (×15): 20 meq via ORAL
  Filled 2022-06-21 (×15): qty 1

## 2022-06-21 MED ORDER — ACETAMINOPHEN 325 MG PO TABS
650.0000 mg | ORAL_TABLET | ORAL | Status: DC | PRN
  Administered 2022-06-24 – 2022-07-02 (×13): 650 mg via ORAL
  Filled 2022-06-21 (×13): qty 2

## 2022-06-21 MED ORDER — PANTOPRAZOLE 2 MG/ML SUSPENSION
40.0000 mg | Freq: Every day | ORAL | Status: DC
  Administered 2022-06-21 – 2022-07-05 (×15): 40 mg via ORAL
  Filled 2022-06-21 (×16): qty 20

## 2022-06-21 MED ORDER — ADULT MULTIVITAMIN W/MINERALS CH
1.0000 | ORAL_TABLET | Freq: Every day | ORAL | Status: DC
  Administered 2022-06-22 – 2022-07-06 (×15): 1 via ORAL
  Filled 2022-06-21 (×15): qty 1

## 2022-06-21 MED ORDER — ONDANSETRON HCL 4 MG PO TABS: 4.0000 mg | ORAL_TABLET | ORAL | Status: DC | PRN

## 2022-06-21 MED ORDER — QUETIAPINE FUMARATE 50 MG PO TABS
50.0000 mg | ORAL_TABLET | Freq: Two times a day (BID) | ORAL | Status: DC
  Administered 2022-06-21 – 2022-07-06 (×30): 50 mg via ORAL
  Filled 2022-06-21 (×31): qty 1

## 2022-06-21 MED ORDER — CITALOPRAM HYDROBROMIDE 10 MG PO TABS
20.0000 mg | ORAL_TABLET | Freq: Every day | ORAL | Status: DC
  Administered 2022-06-22 – 2022-07-06 (×15): 20 mg via ORAL
  Filled 2022-06-21 (×15): qty 2

## 2022-06-21 NOTE — Progress Notes (Signed)
Physical Therapy Treatment Patient Details Name: Jose Li MRN: [id number] DOB: [birth year] Today's Date: 06/21/2022   History of Present Illness Pt is 66 year old male found 06/07/22 on ground at home during wellness check  and admitted with subdural hematoma. DJ:MEQAS acute hemispheric subdural hematoma on the left with mass effect. 7/25: Left frontotemporoparietal craniotomy and evacuation of acute posttraumatic subdural hematoma, placement of subdural drain (has been pulled). Pt pulled out Cortrak evening of 8/3.  PHMx: seizure disorder, alcohol disorder.    PT Comments    Making good progress towards physical therapy goals which have been appropriately updated today. Ambulated into hallway short distance today with min assist for balance, frequent verbal cues for AD use and sequencing. HR 113 resting  at start of therapy, 145 max briefly at end of ambulatory distance (avg in 130s throughout session,) and down to 108 once back in bed. Tolerated LE exercises but declined standing for further exercise. Does require motivation at times, his girlfriend is very good at getting him to participate further in tx. Patient will continue to benefit from skilled physical therapy services to further improve independence with functional mobility.   Recommendations for follow up therapy are one component of a multi-disciplinary discharge planning process, led by the attending physician.  Recommendations may be updated based on patient status, additional functional criteria and insurance authorization.  Follow Up Recommendations  Skilled nursing-short term rehab (<3 hours/day) Can patient physically be transported by private vehicle: Yes   Assistance Recommended at Discharge Frequent or constant Supervision/Assistance  Patient can return home with the following Assistance with cooking/housework;Direct supervision/assist for medications management;Direct supervision/assist for financial management;Assist for  transportation;Help with stairs or ramp for entrance;Two people to help with bathing/dressing/bathroom;A little help with walking and/or transfers;A lot of help with walking and/or transfers   Equipment Recommendations   (TBD next venue of care)    Recommendations for Other Services Rehab consult     Precautions / Restrictions Precautions Precautions: Fall Precaution Comments: posey belt; bil wrist restraints Restrictions Weight Bearing Restrictions: No     Mobility  Bed Mobility Overal bed mobility: Needs Assistance Bed Mobility: Supine to Sit, Sit to Supine     Supine to sit: Min assist     General bed mobility comments: Min assist to facilitate pt to EOB, assist with blankets and tubes.    Transfers Overall transfer level: Needs assistance Equipment used: Rolling walker (2 wheels) Transfers: Sit to/from Stand Sit to Stand: Min assist           General transfer comment: Min assist to facilitate sit<>stand and for balance control. Use of UEs on RW upon standing and cues to widen BOS for improved balance.    Ambulation/Gait Ambulation/Gait assistance: Min assist Gait Distance (Feet): 24 Feet Assistive device: Rolling walker (2 wheels) Gait Pattern/deviations: Step-through pattern, Decreased stride length, Narrow base of support, Shuffle, Leaning posteriorly Gait velocity: reduced Gait velocity interpretation: <1.31 ft/sec, indicative of household ambulator   General Gait Details: Min assist with RW for UE support. Posterior instability at times requiring assist to recover, tactile cues to facilitate continued gait. No buckling this date. HR did elevated to 145 at max, pt asymptomatic other than feeling tired. Educated on safe AD use and sequencing. practiced backwards stepping (better today but still a challenge,) and lateral steps along bed both require heavy VC and tactile cues.   Stairs             Wheelchair Mobility    Modified Rankin (Stroke  Patients Only) Modified Rankin (Stroke Patients Only) Pre-Morbid Rankin Score: No symptoms Modified Rankin: Moderately severe disability     Balance Overall balance assessment: Needs assistance Sitting-balance support: No upper extremity supported, Feet supported Sitting balance-Leahy Scale: Good Sitting balance - Comments: Sitting EOB unsupported for > 5 min during recovery when HR was elevated from ambulating.   Standing balance support: Single extremity supported Standing balance-Leahy Scale: Poor Standing balance comment: Able to stand with light UE support.                            Cognition Arousal/Alertness: Awake/alert Behavior During Therapy: Flat affect Overall Cognitive Status: Impaired/Different from baseline Area of Impairment: Orientation, Attention, Following commands, Safety/judgement, Awareness, Problem solving                 Orientation Level: Disoriented to, Place, Time Current Attention Level: Sustained   Following Commands: Follows one step commands inconsistently, Follows one step commands with increased time Safety/Judgement: Decreased awareness of safety, Decreased awareness of deficits Awareness: Intellectual Problem Solving: Slow processing, Decreased initiation, Difficulty sequencing, Requires verbal cues, Requires tactile cues General Comments: More responsive to conversation today. Stating needs and wants.        Exercises General Exercises - Lower Extremity Ankle Circles/Pumps: AROM, Both, 5 reps, Seated Long Arc Quad: Both, Seated, Strengthening, 10 reps Hip ABduction/ADduction: Strengthening, Both, 10 reps, Seated Hip Flexion/Marching: Strengthening, Both, 10 reps, Seated    General Comments General comments (skin integrity, edema, etc.): HR 113 at start of therapy, 145 max briefly at end of ambulatory distance (avg in 130s throughout session,) and down to 108 once back in bed.      Pertinent Vitals/Pain Pain  Assessment Pain Assessment: No/denies pain Pain Intervention(s): Monitored during session, Repositioned    Home Living                          Prior Function            PT Goals (current goals can now be found in the care plan section) Acute Rehab PT Goals Patient Stated Goal: lay down PT Goal Formulation: Patient unable to participate in goal setting Time For Goal Achievement: 06/25/22 Potential to Achieve Goals: Good Progress towards PT goals: Progressing toward goals    Frequency    Min 3X/week      PT Plan Current plan remains appropriate    Co-evaluation              AM-PAC PT "6 Clicks" Mobility   Outcome Measure  Help needed turning from your back to your side while in a flat bed without using bedrails?: A Little Help needed moving from lying on your back to sitting on the side of a flat bed without using bedrails?: A Lot Help needed moving to and from a bed to a chair (including a wheelchair)?: A Lot Help needed standing up from a chair using your arms (e.g., wheelchair or bedside chair)?: A Little Help needed to walk in hospital room?: A Little Help needed climbing 3-5 steps with a railing? : Total 6 Click Score: 14    End of Session Equipment Utilized During Treatment: Gait belt Activity Tolerance: Patient tolerated treatment well Patient left: with call bell/phone within reach;with bed alarm set;with restraints reapplied;with family/visitor present;in bed (Bed to chair position while visitors here.)   PT Visit Diagnosis: Unsteadiness on feet (R26.81);Other abnormalities of gait and mobility (  R26.89);Muscle weakness (generalized) (M62.81);Difficulty in walking, not elsewhere classified (R26.2);Dizziness and giddiness (R42)     Time: 0277-4128 PT Time Calculation (min) (ACUTE ONLY): 17 min  Charges:  $Gait Training: 8-22 mins                     Kathlyn Sacramento, PT    [place] 06/21/2022, 3:17 PM

## 2022-06-21 NOTE — Evaluation (Signed)
Clinical/Bedside Swallow Evaluation Patient Details  Name: Jose Li MRN: [id number] Date of Birth: 1956-05-19  Today's Date: 06/21/2022 Time: SLP Start Time (ACUTE ONLY): 1950 SLP Stop Time (ACUTE ONLY): 1003 SLP Time Calculation (min) (ACUTE ONLY): 7 min  Past Medical History:  Past Medical History:  Diagnosis Date   Acute kidney injury (HCC) 05/26/2018   Allergy    Anemia    Anxiety    Depression    Hyperlipidemia    Hypertension, essential, benign 08/02/2012   Orthostatic hypotension 10/09/2013   OSA (obstructive sleep apnea) 10/01/2015   Severe OSA with an AHI of 86/hr now on BiPAP at 23/19cm H2O   Seizures (HCC)    none for last 6-78months   Substance abuse (HCC)    Past Surgical History:  Past Surgical History:  Procedure Laterality Date   CRANIOTOMY Left 06/08/2022   Procedure: CRANIOTOMY HEMATOMA EVACUATION SUBDURAL;  Surgeon: Julio Sicks, MD;  Location: [facility] OR;  Service: Neurosurgery;  Laterality: Left;   FRACTURE SURGERY  1990   right hip and femur   HERNIA REPAIR  07/2009   umbilical hernia/ got infected   JOINT REPLACEMENT Right 1990   right hip   TRACHEOSTOMY  08/1999   Had pneumoniaand was in a coma 10 days   HPI:  Pt is a 66 yo male found down during wellness check on evening of 7/24.  Pt now s/p craniotomy on 7/25 for evauation of SDH.  Most recent CT 7/29: "IMPRESSION: 1. Postoperative changes from recent left frontotemporal craniotomy for subdural evacuation, with interval removal of a subdural drain.  Residual heterogeneous left subdural collection measures up to 14 mm in thickness, similar to previous. Persistent mass effect on the  subjacent left cerebral hemisphere with 3 mm left-to-right shift. 2. Persistent and mildly increased pneumocephalus from prior, most  pronounced over the anterior right greater than left frontal convexities with mass effect on the subjacent brain parenchyma. 3. Interval decrease in hyperdense blood products about the previously seen  smaller right subdural hematoma. Majority of this collection now has an appearance of a hypodense hygroma and measures up to 8 mm in maximal thickness. 4. Continued interval decrease in size of hemorrhagic contusion  involving the left superior frontal gyrus. Additional scattered  intra-axial hemorrhage also continues to decrease. Trace IVH not significantly changed. 5. No other new acute intracranial abnormality."  CXR 7/24 with no active disease. Pt with PMHx of HTN, anemia, anxiety, HLD, seizures on Keppra 1500 twice daily, CKD 3B, EtOH abuse, orthostatic hypotension (follows cardiology, takes midodrine), OSA on BiPAP.    Assessment / Plan / Recommendation  Clinical Impression  Pt presents with grossly functional swallowing as assessed clinically.  Pt exhibited coughing episode on a single trial of cup sip of thin liquid.  Pt denied feeling of water going the wrong way.  RN reports baseline cough.  There were no further clinical s/s of aspiration including with serial straw sips of thin liquid.  Pt is edentulous, and typically wears dentures, but they are not available in facility.  Pt exhited prompt and effective oral clearance of regular solid trials even without dentures.  Discussed diet preference with pt who would like to resume regular texture diet, choosing softer foods as needed for ease of mastication.  Consider cognitive-linguistic assessment if pt is not at baseline level of function.  Recommend regular texture diet with thin liquids.  SLP Visit Diagnosis: Dysphagia, unspecified (R13.10)    Aspiration Risk  No limitations    Diet  Recommendation Regular;Thin liquid   Liquid Administration via: Cup;Straw Medication Administration:  (As tolerated) Supervision: Staff to assist with self feeding;Full supervision/cueing for compensatory strategies Compensations: Slow rate;Small sips/bites;Follow solids with liquid Postural Changes: Seated upright at 90 degrees    Other  Recommendations  Recommended Consults:  (Consider cognitive-linguistic assessment) Oral Care Recommendations: Oral care BID    Recommendations for follow up therapy are one component of a multi-disciplinary discharge planning process, led by the attending physician.  Recommendations may be updated based on patient status, additional functional criteria and insurance authorization.  Follow up Recommendations Skilled nursing-short term rehab (<3 hours/day)      Assistance Recommended at Discharge Frequent or constant Supervision/Assistance  Functional Status Assessment Patient has had a recent decline in their functional status and demonstrates the ability to make significant improvements in function in a reasonable and predictable amount of time.  Frequency and Duration min 2x/week  2 weeks       Prognosis Prognosis for Safe Diet Advancement:  (N/A)      Swallow Study   General Date of Onset: 06/08/22 HPI: Pt is a 66 yo male found down during wellness check on evening of 7/24.  Pt now s/p craniotomy on 7/25 for evauation of SDH.  Most recent CT 7/29: "IMPRESSION: 1. Postoperative changes from recent left frontotemporal craniotomy for subdural evacuation, with interval removal of a subdural drain.  Residual heterogeneous left subdural collection measures up to 14 mm in thickness, similar to previous. Persistent mass effect on the  subjacent left cerebral hemisphere with 3 mm left-to-right shift. 2. Persistent and mildly increased pneumocephalus from prior, most  pronounced over the anterior right greater than left frontal convexities with mass effect on the subjacent brain parenchyma. 3. Interval decrease in hyperdense blood products about the previously seen smaller right subdural hematoma. Majority of this collection now has an appearance of a hypodense hygroma and measures up to 8 mm in maximal thickness. 4. Continued interval decrease in size of hemorrhagic contusion  involving the left superior frontal  gyrus. Additional scattered  intra-axial hemorrhage also continues to decrease. Trace IVH not significantly changed. 5. No other new acute intracranial abnormality."  CXR 7/24 with no active disease. Pt with PMHx of HTN, anemia, anxiety, HLD, seizures on Keppra 1500 twice daily, CKD 3B, EtOH abuse, orthostatic hypotension (follows cardiology, takes midodrine), OSA on BiPAP.    Oral/Motor/Sensory Function Overall Oral Motor/Sensory Function: Mild impairment Facial ROM:  (reduced retraction) Facial Symmetry: Within Functional Limits Lingual ROM: Within Functional Limits Lingual Symmetry: Within Functional Limits Lingual Strength: Reduced Velum: Within Functional Limits Mandible: Within Functional Limits   Ice Chips Ice chips: Not tested   Thin Liquid Thin Liquid: Impaired Presentation: Cup;Straw Pharyngeal  Phase Impairments: Cough - Immediate    Nectar Thick Nectar Thick Liquid: Not tested   Honey Thick Honey Thick Liquid: Not tested   Puree Puree: Within functional limits Presentation: Spoon   Solid     Solid: Within functional limits Presentation: Self Fed      Kerrie Pleasure, MA, CCC-SLP Acute Rehabilitation Services Office: [phone] 06/21/2022,10:19 AM

## 2022-06-21 NOTE — Progress Notes (Signed)
Nutrition Follow-up  DOCUMENTATION CODES:  Non-severe (moderate) malnutrition in context of chronic illness  INTERVENTION:  Continue current diet as ordered, encourage PO intake Pull coretrak tube if adequate intake continues Continue vitamin regimen Ensure Enlive po BID, each supplement provides 350 kcal and 20 grams of protein.  NUTRITION DIAGNOSIS:  Moderate Malnutrition related to social / environmental circumstances as evidenced by moderate muscle depletion, severe muscle depletion, moderate fat depletion. - remains applicable  GOAL:  Patient will meet greater than or equal to 90% of their needs - being met with TF  MONITOR:  Labs, Weight trends, TF tolerance  REASON FOR ASSESSMENT:  Other (Comment) (NPO, Cortrak order) Enteral/tube feeding initiation and management  ASSESSMENT:  66 yo male admitted with L SDH requiring craniotomy with hematoma evacuation. PMH includes EtOH abuse, seizure disorder, HTN, HLD, depression, anxiety. Surgial hx includes trach s/p removal, hernia repair and R hip and femur fx  7/25  Left frontotemporoparietal craniotomy and evacuation of acute posttraumatic subdural hematoma, placement of subdural drain 7/27 s/p cortrak placement; tip in proximal duodenum per xray  8/4 pt pulled cortrak, replaced later that day  Met with pt in room. TF currently not connected to cortrak tube.   Recently worked with SLP and was able to have diet advanced. Breakfast tray at bedside noted to be well consumed. Spoke with RN who also confirmed pt did great with intake this AM. Pt reports feeling well and that he likes ensure. Will add and if pt is able to continue current meal trends, would be in favor of removing cortrak tube.   Nutritionally Relevant Medications: Scheduled Meds:  atorvastatin  20 mg Per Tube Daily   docusate  100 mg Per Tube BID   doxazosin  1 mg Per Tube Daily   feeding supplement (PROSource TF)  45 mL Per Tube BID   insulin aspart  0-15 Units  Subcutaneous Q4H   multivitamin with minerals  1 tablet Per Tube Daily   pantoprazole sodium  40 mg Per Tube QHS   potassium chloride  20 mEq Per Tube Daily   thiamine  100 mg Per Tube Daily   Continuous Infusions:  feeding supplement (OSMOLITE 1.5 CAL) 1,000 mL (06/20/22 2153)   Labs Reviewed: Na 130, chloride 96 CBG ranges from 118-172 mg/dL over the last 24 hours  NUTRITION - FOCUSED PHYSICAL EXAM: Flowsheet Row Most Recent Value  Upper Arm Region Mild depletion  Thoracic and Lumbar Region Moderate depletion  Buccal Region Moderate depletion  Temple Region Moderate depletion  Clavicle Bone Region Moderate depletion  Clavicle and Acromion Bone Region Moderate depletion  Scapular Bone Region Moderate depletion  Dorsal Hand Unable to assess  Patellar Region Severe depletion  Anterior Thigh Region Severe depletion  Posterior Calf Region Severe depletion  Mouth --  [pale tongue, edentulous (wears dentures but not in currently)]  Skin --  [ecchymosis]   Diet Order:   Diet Order             Diet regular Room service appropriate? Yes; Fluid consistency: Thin  Diet effective now                   EDUCATION NEEDS:  No education needs have been identified at this time  Skin:  Skin Assessment: Skin Integrity Issues: Skin Integrity Issues:: Incisions Incisions: head-post craniotomy  Last BM:  8/5 - type 6  Height:  Ht Readings from Last 1 Encounters:  06/21/22 6' (1.829 m)    Weight:  Wt Readings  from Last 1 Encounters:  06/21/22 72.8 kg    Ideal Body Weight:  80.9 kg  BMI:  Body mass index is 21.77 kg/m.  Estimated Nutritional Needs:  Kcal:  2000-2200 kcals Protein:  100-115 g Fluid:  >/= 2 L   Ranell Patrick, RD, LDN Clinical Dietitian RD pager # available in [facility]  After hours/weekend pager # available in [facility]

## 2022-06-21 NOTE — Progress Notes (Signed)
He has been Overall progressing well.  Much more awake and interactive.  Oriented.  Much more awake and interactive.  Oriented.  Passed his swallowing eval today.  Afebrile vital signs are stable, patient alert and good.  Immediately interactive.  Awake and aware.  Oriented times person and year Lasix IV piggyback speech reasonably fluent no significant  Overall he is improving.  Working towards skilled nursing facility placement

## 2022-06-22 LAB — GLUCOSE, CAPILLARY
Glucose-Capillary: 100 mg/dL — ABNORMAL HIGH (ref 70–99)
Glucose-Capillary: 106 mg/dL — ABNORMAL HIGH (ref 70–99)
Glucose-Capillary: 110 mg/dL — ABNORMAL HIGH (ref 70–99)
Glucose-Capillary: 116 mg/dL — ABNORMAL HIGH (ref 70–99)
Glucose-Capillary: 117 mg/dL — ABNORMAL HIGH (ref 70–99)
Glucose-Capillary: 127 mg/dL — ABNORMAL HIGH (ref 70–99)
Glucose-Capillary: 127 mg/dL — ABNORMAL HIGH (ref 70–99)

## 2022-06-22 NOTE — Evaluation (Signed)
Speech Language Pathology Evaluation Patient Details Name: Jose Li MRN: [id number] DOB: [birth year] Today's Date: 06/22/2022 Time: 1497-0263 SLP Time Calculation (min) (ACUTE ONLY): 20 min  Problem List:  Patient Active Problem List   Diagnosis Date Noted   Pressure injury of skin 06/21/2022   Malnutrition of moderate degree 06/10/2022   Status post craniotomy 06/08/2022   COVID-19 virus infection 04/14/2022   Anterior chest wall pain 12/06/2021   Need for immunization against influenza 09/11/2020   Pain of paraspinal muscle 01/14/2020   History of BPH 09/24/2019   Chronic kidney disease (CKD), stage III (moderate) (HCC) 07/13/2019   Varicose veins of right lower extremity with complications 01/24/2017   TBI (traumatic brain injury) (HCC)    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 [facility])    Bipolar affective disorder in remission (HCC)    Essential hypertension    Orthostasis    Seizures (HCC)    SDH (subdural hematoma) (HCC) 01/28/2016   OSA (obstructive sleep apnea) 10/01/2015   TOBACCO ABUSE 12/14/2007   Hyperlipidemia 10/09/2007   Epilepsy (HCC) 05/10/2007   BIPOLAR DISORDER 01/12/2007   Past Medical History:  Past Medical History:  Diagnosis Date   Acute kidney injury (HCC) 05/26/2018   Allergy    Anemia    Anxiety    Depression    Hyperlipidemia    Hypertension, essential, benign 08/02/2012   Orthostatic hypotension 10/09/2013   OSA (obstructive sleep apnea) 10/01/2015   Severe OSA with an AHI of 86/hr now on BiPAP at 23/19cm H2O   Seizures (HCC)    none for last 6-25months   Substance abuse (HCC)    Past Surgical History:  Past Surgical History:  Procedure Laterality Date   CRANIOTOMY Left 06/08/2022   Procedure: CRANIOTOMY HEMATOMA EVACUATION SUBDURAL;  Surgeon: Julio Sicks, MD;  Location: [facility] OR;  Service: Neurosurgery;  Laterality: Left;   FRACTURE SURGERY  1990   right hip and femur   HERNIA REPAIR  07/2009   umbilical hernia/ got infected   JOINT  REPLACEMENT Right 1990   right hip   TRACHEOSTOMY  08/1999   Had pneumoniaand was in a coma 10 days   HPI:  Pt is a 66 yo male found down during wellness check on evening of 7/24.  Pt now s/p craniotomy on 7/25 for evacuation of SDH.  Most recent CT 7/29: "IMPRESSION: 1. Postoperative changes from recent left frontotemporal craniotomy for subdural evacuation, with interval removal of a subdural drain.  Residual heterogeneous left subdural collection measures up to 14 mm in thickness, similar to previous. Persistent mass effect on the  subjacent left cerebral hemisphere with 3 mm left-to-right shift. 2. Persistent and mildly increased pneumocephalus from prior, most  pronounced over the anterior right greater than left frontal convexities with mass effect on the subjacent brain parenchyma. 3. Interval decrease in hyperdense blood products about the previously seen smaller right subdural hematoma. Majority of this collection now has an appearance of a hypodense hygroma and measures up to 8 mm in maximal thickness. 4. Continued interval decrease in size of hemorrhagic contusion  involving the left superior frontal gyrus. Additional scattered  intra-axial hemorrhage also continues to decrease. Trace IVH not significantly changed. 5. No other new acute intracranial abnormality."  CXR 7/24 with no active disease. Pt with PMHx of HTN, anemia, anxiety, HLD, seizures on Keppra 1500 twice daily, CKD 3B, EtOH abuse, orthostatic hypotension (follows cardiology, takes midodrine), OSA on BiPAP.   Assessment / Plan / Recommendation Clinical Impression  Patient presents with moderately impaired cogntion as per this speech-language cognitive evaluation. He was oriented to fact that he is in the hospital but states "[employer]" (he is in [place] currently), was oriented to the year but not date, day of week, month. No family present to confirm basline cognition but patient reported he is not very active and spends a lot  of time watching TV. He does not drive and will either call a cab or have a friend pick him up if he needs to grocery shop, etc. During evaluation, patient participated but attention, alertness were decreased and patient would often quicly respond, "I dont know". He was only able to name three things when given category (animals) and when later asked to demonstrate recall of 5 words presented less than 2 minutes ago, he repeated animals he had named instead. He required verbal cues to elaborate and initiate during session but towards end of session he did initiate a few times, telling SLP " I think that's all the remembering I can do today" but also stating "Things will get better". SLP is recommending skilled treatment while in hospital and upon discharge.    SLP Assessment  SLP Recommendation/Assessment: Patient needs continued Speech Lanaguage Pathology Services SLP Visit Diagnosis: Cognitive communication deficit (R41.841)    Recommendations for follow up therapy are one component of a multi-disciplinary discharge planning process, led by the attending physician.  Recommendations may be updated based on patient status, additional functional criteria and insurance authorization.    Follow Up Recommendations  Skilled nursing-short term rehab (<3 hours/day)    Assistance Recommended at Discharge  Frequent or constant Supervision/Assistance  Functional Status Assessment Patient has had a recent decline in their functional status and demonstrates the ability to make significant improvements in function in a reasonable and predictable amount of time.  Frequency and Duration min 2x/week  2 weeks      SLP Evaluation Cognition  Overall Cognitive Status: Impaired/Different from baseline Arousal/Alertness: Awake/alert Orientation Level: Oriented to person;Disoriented to time;Disoriented to situation;Disoriented to place Year: 2023 Month: July Day of Week: Incorrect Attention: Sustained Sustained  Attention: Impaired Sustained Attention Impairment: Verbal basic;Functional basic;Verbal complex Memory: Impaired Memory Impairment: Decreased recall of new information;Storage deficit Awareness: Impaired Awareness Impairment: Intellectual impairment Problem Solving: Impaired Problem Solving Impairment: Verbal complex Safety/Judgment: Impaired       Comprehension  Auditory Comprehension Overall Auditory Comprehension: Appears within functional limits for tasks assessed    Expression Expression Primary Mode of Expression: Verbal Verbal Expression Overall Verbal Expression: Appears within functional limits for tasks assessed   Oral / Motor  Oral Motor/Sensory Function Overall Oral Motor/Sensory Function: Mild impairment Facial Symmetry: Within Functional Limits Lingual ROM: Within Functional Limits Lingual Symmetry: Within Functional Limits Lingual Strength: Reduced Velum: Within Functional Limits Mandible: Within Functional Limits    Angela Nevin, MA, CCC-SLP Speech Therapy

## 2022-06-22 NOTE — Progress Notes (Signed)
Physical Therapy Treatment Patient Details Name: Jose Li MRN: [id number] DOB: 10/28/1956 Today's Date: 06/22/2022   History of Present Illness Pt is 66 year old male found 06/07/22 on ground at home during wellness check  and admitted with subdural hematoma. VO:HYWVP acute hemispheric subdural hematoma on the left with mass effect. 7/25: Left frontotemporoparietal craniotomy and evacuation of acute posttraumatic subdural hematoma, placement of subdural drain (has been pulled). Pt pulled out Cortrak evening of 8/3.  PHMx: seizure disorder, alcohol disorder.    PT Comments    Limited treatment today, pt agitated. Able to sit EOB with therapist, unsupported but frequently attempting to lie back down. Refuses standing or attempting gait/ OOB activities today. Seems to do better when his girlfriend Gunnar Fusi is in the room to help encourage him. Worked on bed mobility as tolerated. Pt not opening eyes much but frequently stating "I can't do it" even before new instructions for simple tasks provided. Patient will continue to benefit from skilled physical therapy services to further improve independence with functional mobility.    Recommendations for follow up therapy are one component of a multi-disciplinary discharge planning process, led by the attending physician.  Recommendations may be updated based on patient status, additional functional criteria and insurance authorization.  Follow Up Recommendations  Skilled nursing-short term rehab (<3 hours/day) Can patient physically be transported by private vehicle: Yes   Assistance Recommended at Discharge Frequent or constant Supervision/Assistance  Patient can return home with the following Assistance with cooking/housework;Direct supervision/assist for medications management;Direct supervision/assist for financial management;Assist for transportation;Help with stairs or ramp for entrance;Two people to help with bathing/dressing/bathroom;A little help  with walking and/or transfers   Equipment Recommendations   (TBD next venue of care)    Recommendations for Other Services Rehab consult     Precautions / Restrictions Precautions Precautions: Fall Restrictions Weight Bearing Restrictions: No     Mobility  Bed Mobility Overal bed mobility: Needs Assistance Bed Mobility: Supine to Sit, Sit to Supine     Supine to sit: Min assist Sit to supine: Min assist   General bed mobility comments: Min assist to facilitate LEs out of bed and for trunk support. Cues to keep eyes open and scoot to EOB. Once sitting pt frequently attempting to lie back down. Refused standing this date. Worked on scooting up in bed with minimal initiation from pt.    Transfers                   General transfer comment: Refused- agitated.    Ambulation/Gait                   Stairs             Wheelchair Mobility    Modified Rankin (Stroke Patients Only) Modified Rankin (Stroke Patients Only) Pre-Morbid Rankin Score: No symptoms Modified Rankin: Moderately severe disability     Balance Overall balance assessment: Needs assistance Sitting-balance support: No upper extremity supported, Feet supported Sitting balance-Leahy Scale: Good Sitting balance - Comments: Tolerated approx 5 min EOB but limited willingness to participate today.                                    Cognition Arousal/Alertness: Lethargic Behavior During Therapy: Flat affect, Agitated Overall Cognitive Status: Impaired/Different from baseline Area of Impairment: Orientation, Attention, Following commands, Safety/judgement, Awareness, Problem solving  Orientation Level: Disoriented to, Place, Time Current Attention Level: Sustained   Following Commands: Follows one step commands inconsistently, Follows one step commands with increased time Safety/Judgement: Decreased awareness of safety, Decreased awareness of  deficits Awareness: Intellectual Problem Solving: Slow processing, Decreased initiation, Difficulty sequencing, Requires verbal cues, Requires tactile cues General Comments: Pt agitated today, stating "I can't do it," before giving instructions for simple tasks. Not opening eyes much but does respond to some questions appropriately, recalls girlfriend's name.        Exercises      General Comments General comments (skin integrity, edema, etc.): HR 113 at rest, up to 135 sitting EOB, back to 110s end of session in supine.      Pertinent Vitals/Pain Pain Assessment Pain Assessment: No/denies pain Pain Location: I just don't feel good. Pain Intervention(s): Monitored during session, Repositioned    Home Living                          Prior Function            PT Goals (current goals can now be found in the care plan section) Acute Rehab PT Goals Patient Stated Goal: lay down PT Goal Formulation: Patient unable to participate in goal setting Time For Goal Achievement: 06/25/22 Potential to Achieve Goals: Good Progress towards PT goals: Progressing toward goals    Frequency    Min 3X/week      PT Plan Current plan remains appropriate    Co-evaluation              AM-PAC PT "6 Clicks" Mobility   Outcome Measure  Help needed turning from your back to your side while in a flat bed without using bedrails?: A Little Help needed moving from lying on your back to sitting on the side of a flat bed without using bedrails?: A Lot Help needed moving to and from a bed to a chair (including a wheelchair)?: A Lot Help needed standing up from a chair using your arms (e.g., wheelchair or bedside chair)?: A Little Help needed to walk in hospital room?: A Little Help needed climbing 3-5 steps with a railing? : Total 6 Click Score: 14    End of Session   Activity Tolerance: Treatment limited secondary to agitation Patient left: with call bell/phone within  reach;with bed alarm set;in bed Nurse Communication: Mobility status PT Visit Diagnosis: Unsteadiness on feet (R26.81);Other abnormalities of gait and mobility (R26.89);Muscle weakness (generalized) (M62.81);Difficulty in walking, not elsewhere classified (R26.2);Dizziness and giddiness (R42)     Time: 1610-9604 PT Time Calculation (min) (ACUTE ONLY): 13 min  Charges:  $Therapeutic Activity: 8-22 mins                     Kathlyn Sacramento, PT    [place]erton Mount 06/22/2022, 3:42 PM

## 2022-06-22 NOTE — Progress Notes (Signed)
Patient continues to make slow steady progress.  He is wide awake and alert.  He is minimally confused.  He is tolerating a regular diet.  His activity level is still poor.  His wound is well-healed.  His speech is fluent.  His motor and sensory function are intact bilaterally.  Patient progressing well following craniotomy for subdural hematoma.  Continue efforts at rehabilitation.  Patient okay for transfer to skilled nursing facility once bed available.

## 2022-06-23 LAB — GLUCOSE, CAPILLARY
Glucose-Capillary: 111 mg/dL — ABNORMAL HIGH (ref 70–99)
Glucose-Capillary: 114 mg/dL — ABNORMAL HIGH (ref 70–99)
Glucose-Capillary: 116 mg/dL — ABNORMAL HIGH (ref 70–99)
Glucose-Capillary: 145 mg/dL — ABNORMAL HIGH (ref 70–99)
Glucose-Capillary: 151 mg/dL — ABNORMAL HIGH (ref 70–99)
Glucose-Capillary: 169 mg/dL — ABNORMAL HIGH (ref 70–99)

## 2022-06-23 NOTE — TOC Progression Note (Signed)
Transition of Care [facility]) - Progression Note    Patient Details  Name: Jose Li MRN: [id number] Date of Birth: [birth year]  [facility] CM/SW Contact  Baldemar Lenis, [place] Phone Number: 06/23/2022, 1:03 PM  Clinical Narrative:   CSW spoke with patient's significant other, Gunnar Fusi, to provide bed offers. Gunnar Fusi to look into [facility], but also asking about [facility]. CSW sent referral to [facility] and asked them to review, but [facility] declined. CSW updated Gunnar Fusi, and she is in agreement with [facility]. CSW requested initiation of insurance authorization request.   Patient's PASRR is also pending. CSW updated requested information, answered questions of PASRR reviewer. PASRR still pending. CSW to follow.    Expected Discharge Plan: Skilled Nursing Facility Barriers to Discharge: Continued Medical Work up; Awaiting State Approval (PASRR)  Expected Discharge Plan and Services Expected Discharge Plan: Skilled Nursing Facility                                               Social Determinants of Health (SDOH) Interventions    Readmission Risk Interventions     No data to display

## 2022-06-23 NOTE — Plan of Care (Signed)
  Problem: Clinical Measurements: Goal: Diagnostic test results will improve Outcome: Progressing   Problem: Activity: Goal: Risk for activity intolerance will decrease Outcome: Progressing   Problem: Coping: Goal: Level of anxiety will decrease Outcome: Progressing   

## 2022-06-23 NOTE — Progress Notes (Signed)
Overall stable.  No new issues or problems yesterday.  Patient awake and aware.  Speech is reasonably fluent.  Remains minimally confused.  Motor and sensory function intact.  Wound clean and dry.  Overall stable.  Patient ready for discharge to skilled nursing facility once bed available.

## 2022-06-23 NOTE — TOC Progression Note (Signed)
Transition of Care [facility]) - Progression Note    Patient Details  Name: Jose Li MRN: [id number] Date of Birth: [birth year]  Transition of Care [facility]) CM/SW Contact  Baldemar Lenis, [place] Phone Number: 06/23/2022, 1:05 PM  Clinical Narrative:   CSW received insurance authorization for [facility], confirmed bed availability. Patient's PASRR remains pending, has gone to Level 2 review. CSW updated significant other about barrier to discharge, and Gunnar Fusi requested private room at [facility], as well. CSW notified [facility]. CSW to continue to follow.  UPDATE: CSW received call from [facility] that the administrator reviewed the referral after admissions and rescinded bed offer, unable to accept patient.     Expected Discharge Plan: Skilled Nursing Facility Barriers to Discharge: Continued Medical Work up; Awaiting State Approval (PASRR)  Expected Discharge Plan and Services Expected Discharge Plan: Skilled Nursing Facility                                               Social Determinants of Health (SDOH) Interventions    Readmission Risk Interventions     No data to display

## 2022-06-23 NOTE — Progress Notes (Signed)
Occupational Therapy Treatment Patient Details Name: Jose Li MRN: [id number] DOB: 1956-09-18 Today's Date: 06/23/2022   History of present illness Pt is 66 year old male found 06/07/22 on ground at home during wellness check  and admitted with subdural hematoma. EQ:[facility] acute hemispheric subdural hematoma on the left with mass effect. 7/25: Left frontotemporoparietal craniotomy and evacuation of acute posttraumatic subdural hematoma, placement of subdural drain (has been pulled). Pt pulled out Cortrak evening of 8/3.  PHMx: seizure disorder, alcohol disorder.   OT comments  Pt progressing towards goals, able to complete standing x2 and side stepping at EOB for pad change with modA. Pt completing UB dressing sitting EOB with minA, has posterior lean at EOB with dual tasking needing cues to recorrect. Pt HR up to 146 with standing, further mobility deferred at this time. Pt presenting with impairments listed below, will follow acutely. Continue to recommend SNF at d/c.   Recommendations for follow up therapy are one component of a multi-disciplinary discharge planning process, led by the attending physician.  Recommendations may be updated based on patient status, additional functional criteria and insurance authorization.    Follow Up Recommendations  Skilled nursing-short term rehab (<3 hours/day)    Assistance Recommended at Discharge Frequent or constant Supervision/Assistance  Patient can return home with the following  Two people to help with walking and/or transfers;A lot of help with bathing/dressing/bathroom;Assistance with cooking/housework;Assistance with feeding;Help with stairs or ramp for entrance;Assist for transportation;Direct supervision/assist for financial management;Direct supervision/assist for medications management   Equipment Recommendations  Other (comment) (defer to next venue of care)    Recommendations for Other Services PT consult    Precautions / Restrictions  Precautions Precautions: Fall Precaution Comments: watch HR Restrictions Weight Bearing Restrictions: No       Mobility Bed Mobility Overal bed mobility: Needs Assistance Bed Mobility: Supine to Sit, Sit to Supine Rolling: Min guard   Supine to sit: Min guard     General bed mobility comments: min guard, cues to pull self up in bed    Transfers Overall transfer level: Needs assistance Equipment used: Rolling walker (2 wheels) Transfers: Sit to/from Stand Sit to Stand: Mod assist                 Balance Overall balance assessment: Needs assistance Sitting-balance support: No upper extremity supported, Feet supported Sitting balance-Leahy Scale: Fair Sitting balance - Comments: pt with posterior lean with dual tasking, cues to hold self up with feet on floor   Standing balance support: Bilateral upper extremity supported Standing balance-Leahy Scale: Poor Standing balance comment: stand with                           ADL either performed or assessed with clinical judgement   ADL Overall ADL's : Needs assistance/impaired                 Upper Body Dressing : Minimal assistance;Sitting Upper Body Dressing Details (indicate cue type and reason): donning gown sitting EOB     Toilet Transfer: Moderate assistance;Minimal assistance Toilet Transfer Details (indicate cue type and reason): able to stand and take side steps at EOB         Functional mobility during ADLs: Moderate assistance      Extremity/Trunk Assessment Upper Extremity Assessment Upper Extremity Assessment: Generalized weakness RUE Deficits / Details: able to use RUE to pull self up in bed, uses for fine motor buttoning task RUE Coordination: decreased fine motor  LUE Deficits / Details: able to use LUE to pull self up in bed, uses for fine motor buttoning task LUE Coordination: decreased fine motor            Vision   Additional Comments: WFL for basic mobility/ADL  tasks, will further assess   Perception Perception Perception: Not tested   Praxis Praxis Praxis: Not tested    Cognition Arousal/Alertness: Lethargic Behavior During Therapy: Flat affect, Agitated Overall Cognitive Status: Impaired/Different from baseline Area of Impairment: Orientation, Attention, Following commands, Safety/judgement, Awareness, Problem solving                 Orientation Level: Disoriented to, Place, Time Current Attention Level: Sustained   Following Commands: Follows one step commands with increased time Safety/Judgement: Decreased awareness of safety, Decreased awareness of deficits Awareness: Intellectual Problem Solving: Slow processing, Decreased initiation, Difficulty sequencing, Requires verbal cues, Requires tactile cues General Comments: pt with flat affect, mildly impulsive, following commands appropriately        Exercises      Shoulder Instructions       General Comments HR 146 with standing x2 and side steps    Pertinent Vitals/ Pain       Pain Assessment Pain Assessment: No/denies pain Pain Location: back Pain Descriptors / Indicators: Discomfort Pain Intervention(s): Monitored during session, Repositioned, Limited activity within patient's tolerance  Home Living                                          Prior Functioning/Environment              Frequency  Min 2X/week        Progress Toward Goals  OT Goals(current goals can now be found in the care plan section)  Progress towards OT goals: Progressing toward goals  Acute Rehab OT Goals Patient Stated Goal: none stated OT Goal Formulation: With patient/family Time For Goal Achievement: 06/25/22 Potential to Achieve Goals: Good ADL Goals Pt Will Perform Grooming: with set-up;with supervision;standing Pt Will Perform Upper Body Bathing: with set-up;with supervision;sitting Pt Will Perform Lower Body Bathing: with set-up;with supervision;sit  to/from stand Pt Will Perform Upper Body Dressing: with supervision;with set-up;sitting Pt Will Perform Lower Body Dressing: with set-up;with supervision;sit to/from stand Pt Will Transfer to Toilet: with supervision;ambulating;bedside commode Pt Will Perform Toileting - Clothing Manipulation and hygiene: with supervision;sit to/from stand Additional ADL Goal #1: Pt will be S in and OOB for basic ADLs Additional ADL Goal #2: Pt will follow 75% of commands during session  Plan Discharge plan needs to be updated    Co-evaluation                 AM-PAC OT "6 Clicks" Daily Activity     Outcome Measure   Help from another person eating meals?: A Lot Help from another person taking care of personal grooming?: A Little Help from another person toileting, which includes using toliet, bedpan, or urinal?: A Lot Help from another person bathing (including washing, rinsing, drying)?: A Lot Help from another person to put on and taking off regular upper body clothing?: A Little Help from another person to put on and taking off regular lower body clothing?: A Lot 6 Click Score: 14    End of Session Equipment Utilized During Treatment: Gait belt;Rolling walker (2 wheels)  OT Visit Diagnosis: Unsteadiness on feet (R26.81);Other abnormalities of gait and mobility (  R26.89);Other symptoms and signs involving cognitive function   Activity Tolerance Patient tolerated treatment well   Patient Left in bed;with call bell/phone within reach;with bed alarm set;with family/visitor present   Nurse Communication Mobility status        Time: [id number] OT Time Calculation (min): 21 min  Charges: OT General Charges $OT Visit: 1 Visit OT Treatments $Self Care/Home Management : 8-22 mins  Lynnda Child, OTD, OTR/L Acute Rehab [phone]   Ellie Lunch Jaan Fischel 06/23/2022, 1:17 PM

## 2022-06-24 LAB — GLUCOSE, CAPILLARY
Glucose-Capillary: 111 mg/dL — ABNORMAL HIGH (ref 70–99)
Glucose-Capillary: 120 mg/dL — ABNORMAL HIGH (ref 70–99)
Glucose-Capillary: 139 mg/dL — ABNORMAL HIGH (ref 70–99)
Glucose-Capillary: 170 mg/dL — ABNORMAL HIGH (ref 70–99)
Glucose-Capillary: 183 mg/dL — ABNORMAL HIGH (ref 70–99)
Glucose-Capillary: 195 mg/dL — ABNORMAL HIGH (ref 70–99)
Glucose-Capillary: 222 mg/dL — ABNORMAL HIGH (ref 70–99)

## 2022-06-24 NOTE — Progress Notes (Signed)
SLP Cancellation Note  Patient Details Name: Jose Li MRN: [id number] DOB: [birth year]   Cancelled treatment:       Reason Eval/Treat Not Completed: Patient at procedure or test/unavailable (Pt receiving bath at this time. SLP will follow up later as schedule allows.)  Shamaine Mulkern I. Vear Clock, MS, CCC-SLP Acute Rehabilitation Services Office number [phone]  Scheryl Marten 06/24/2022, 8:53 AM

## 2022-06-24 NOTE — TOC Progression Note (Signed)
Transition of Care [facility]) - Progression Note    Patient Details  Name: Jose Li MRN: [id number] Date of Birth: June 03, 1956  Transition of Care [facility]) CM/SW Contact  Baldemar Lenis, [place] Phone Number: 06/24/2022, 3:38 PM  Clinical Narrative:   CSW spoke with patient's significant other to update that bed offer was rescinded, and patient has no other bed offers at this time. CSW reached out to pending referral at [facility], awaiting response. CSW to follow.    Expected Discharge Plan: Skilled Nursing Facility Barriers to Discharge: SNF Pending bed offer  Expected Discharge Plan and Services Expected Discharge Plan: Skilled Nursing Facility                                               Social Determinants of Health (SDOH) Interventions    Readmission Risk Interventions     No data to display

## 2022-06-24 NOTE — Progress Notes (Signed)
Patient continues to progressively recover.  Overall he looks pretty good at present.  He is awake and alert.  He is conversing easily.  He is more oriented.  His motor and sensory function are intact.  He is ready for skilled nursing facility placement once bed is available.

## 2022-06-24 NOTE — Progress Notes (Signed)
Speech Language Pathology Treatment: Cognitive-Linquistic  Patient Details Name: Jose Li MRN: [id number] DOB: 1956/03/27 Today's Date: 06/24/2022 Time: 1203-1219 SLP Time Calculation (min) (ACUTE ONLY): 16 min  Assessment / Plan / Recommendation Clinical Impression  Pt was seen for cognitive-linguistic treatment. He was alert and cooperative during the session and reported that he believes his cognition is improving, but is still "fair" compared to baseline. Pt demonstrated 60% accuracy with a medication management (prescription) task increasing to 100% with prompts for reasoning. Pt achieved 20% accuracy with time management problems given verbal prompts. Pt required cues for provision of details and steps during a verbal sequencing task. Pt requested that food be deferred since he had just eaten ice cream. SLP will continue to follow pt.     HPI HPI: Pt is a 66 yo male found down during wellness check on evening of 7/24.  Pt now s/p craniotomy on 7/25 for evacuation of SDH.  Most recent CT 7/29: "IMPRESSION: 1. Postoperative changes from recent left frontotemporal craniotomy for subdural evacuation, with interval removal of a subdural drain.  Residual heterogeneous left subdural collection measures up to 14 mm in thickness, similar to previous. Persistent mass effect on the  subjacent left cerebral hemisphere with 3 mm left-to-right shift. 2. Persistent and mildly increased pneumocephalus from prior, most  pronounced over the anterior right greater than left frontal convexities with mass effect on the subjacent brain parenchyma. 3. Interval decrease in hyperdense blood products about the previously seen smaller right subdural hematoma. Majority of this collection now has an appearance of a hypodense hygroma and measures up to 8 mm in maximal thickness. 4. Continued interval decrease in size of hemorrhagic contusion  involving the left superior frontal gyrus. Additional scattered  intra-axial  hemorrhage also continues to decrease. Trace IVH not significantly changed. 5. No other new acute intracranial abnormality."  CXR 7/24 with no active disease. Pt with PMHx of HTN, anemia, anxiety, HLD, seizures on Keppra 1500 twice daily, CKD 3B, EtOH abuse, orthostatic hypotension (follows cardiology, takes midodrine), OSA on BiPAP.      SLP Plan  Continue with current plan of care      Recommendations for follow up therapy are one component of a multi-disciplinary discharge planning process, led by the attending physician.  Recommendations may be updated based on patient status, additional functional criteria and insurance authorization.    Recommendations  Diet recommendations: Regular;Thin liquid Liquids provided via: Cup;Straw Medication Administration: Whole meds with liquid Compensations: Slow rate;Small sips/bites;Follow solids with liquid Postural Changes and/or Swallow Maneuvers: Seated upright 90 degrees                Oral Care Recommendations: Oral care BID Follow Up Recommendations: Skilled nursing-short term rehab (<3 hours/day) Assistance recommended at discharge: Frequent or constant Supervision/Assistance SLP Visit Diagnosis: Cognitive communication deficit (M46.803) Plan: Continue with current plan of care          Hernandez Losasso I. Vear Clock, MS, CCC-SLP Acute Rehabilitation Services Office number [phone]  Scheryl Marten  06/24/2022, 12:23 PM

## 2022-06-24 NOTE — Plan of Care (Signed)
?  Problem: Clinical Measurements: ?Goal: Respiratory complications will improve ?Outcome: Progressing ?  ?Problem: Activity: ?Goal: Risk for activity intolerance will decrease ?Outcome: Progressing ?  ?Problem: Nutrition: ?Goal: Adequate nutrition will be maintained ?Outcome: Progressing ?  ?Problem: Coping: ?Goal: Level of anxiety will decrease ?Outcome: Progressing ?  ?

## 2022-06-24 NOTE — Progress Notes (Signed)
Pt has pulled out 2 IVs on this shift Pulled out the first while he was asleep but it was replaced and it was immediately pulled out as soon as it was placed.  He does not have scheduled IV meds, only prn that he has not needed. He said he is not taking another IV. I did page on call MD to notify.

## 2022-06-24 NOTE — Plan of Care (Signed)
  Problem: Skin Integrity: Goal: Demonstration of wound healing without infection will improve Outcome: Progressing   Problem: Activity: Goal: Risk for activity intolerance will decrease Outcome: Progressing   Problem: Nutrition: Goal: Adequate nutrition will be maintained Outcome: Progressing   Problem: Coping: Goal: Level of anxiety will decrease Outcome: Progressing

## 2022-06-25 ENCOUNTER — Encounter (HOSPITAL_COMMUNITY): Payer: Self-pay | Admitting: Neurosurgery

## 2022-06-25 ENCOUNTER — Other Ambulatory Visit: Payer: Self-pay

## 2022-06-25 LAB — GLUCOSE, CAPILLARY
Glucose-Capillary: 110 mg/dL — ABNORMAL HIGH (ref 70–99)
Glucose-Capillary: 116 mg/dL — ABNORMAL HIGH (ref 70–99)
Glucose-Capillary: 147 mg/dL — ABNORMAL HIGH (ref 70–99)
Glucose-Capillary: 92 mg/dL (ref 70–99)
Glucose-Capillary: 104 mg/dL — ABNORMAL HIGH (ref 70–99)
Glucose-Capillary: 123 mg/dL — ABNORMAL HIGH (ref 70–99)

## 2022-06-25 NOTE — Progress Notes (Signed)
Physical Therapy Treatment Patient Details Name: Jose Li MRN: [id number] DOB: [birth year] Today's Date: 06/25/2022   History of Present Illness Pt is 66 year old male found 06/07/22 on ground at home during wellness check  and admitted with subdural hematoma. CV:[facility] acute hemispheric subdural hematoma on the left with mass effect. 7/25: Left frontotemporoparietal craniotomy and evacuation of acute posttraumatic subdural hematoma, placement of subdural drain (has been pulled). Pt pulled out Cortrak evening of 8/3.  PHMx: seizure disorder, alcohol disorder.    PT Comments    Good progress towards functional goals which have been further updated as appropriate. Goals further progressed due to prolonged hospitalization. Demonstrates ambulatory ability with min assist up to 85 feet x2 today. HR 120 at rest up to 150 while ambulating. Patient will continue to benefit from skilled physical therapy services to further improve independence with functional mobility..    Recommendations for follow up therapy are one component of a multi-disciplinary discharge planning process, led by the attending physician.  Recommendations may be updated based on patient status, additional functional criteria and insurance authorization.  Follow Up Recommendations  Skilled nursing-short term rehab (<3 hours/day) Can patient physically be transported by private vehicle: Yes   Assistance Recommended at Discharge Frequent or constant Supervision/Assistance  Patient can return home with the following Assistance with cooking/housework;Direct supervision/assist for medications management;Direct supervision/assist for financial management;Assist for transportation;Help with stairs or ramp for entrance;Two people to help with bathing/dressing/bathroom;A little help with walking and/or transfers   Equipment Recommendations   (TBD next venue of care)    Recommendations for Other Services Rehab consult     Precautions /  Restrictions Precautions Precautions: Fall Precaution Comments: watch HR tachy Restrictions Weight Bearing Restrictions: No     Mobility  Bed Mobility Overal bed mobility: Needs Assistance Bed Mobility: Supine to Sit, Sit to Supine     Supine to sit: Supervision Sit to supine: Supervision   General bed mobility comments: Supervision for safety. Pt with better control today, no physical assist required. Cues for sequencing.    Transfers Overall transfer level: Needs assistance Equipment used: Rolling walker (2 wheels), 1 person hand held assist Transfers: Sit to/from Stand Sit to Stand: Min assist           General transfer comment: Min assist for balance, cues for technique and hand placement. Notable improvement in stability with RW available for support upon standing.    Ambulation/Gait Ambulation/Gait assistance: Min assist Gait Distance (Feet): 85 Feet (x2) Assistive device: Rolling walker (2 wheels), 1 person hand held assist Gait Pattern/deviations: Step-through pattern, Decreased stride length, Ataxic, Scissoring, Staggering left, Staggering right, Drifts right/left Gait velocity: decreased Gait velocity interpretation: <1.8 ft/sec, indicate of risk for recurrent falls   General Gait Details: Performed with min assist for intermittent LOB, with hand held assist 85 feet, followed by 85 feet again with min assist for balance using RW. Cues for symmetry, safety awareness and wider BOS during turns to prevent scissoring. Practiced lateral steps along bed, and backwards stepping in room with improved sequencing noted today.   Stairs             Wheelchair Mobility    Modified Rankin (Stroke Patients Only) Modified Rankin (Stroke Patients Only) Pre-Morbid Rankin Score: No symptoms Modified Rankin: Moderately severe disability     Balance Overall balance assessment: Needs assistance Sitting-balance support: No upper extremity supported, Feet  supported Sitting balance-Leahy Scale: Good     Standing balance support: Single extremity supported Standing balance-Leahy Scale: Poor  Cognition Arousal/Alertness: Awake/alert Behavior During Therapy: Flat affect Overall Cognitive Status: Impaired/Different from baseline Area of Impairment: Attention, Following commands, Safety/judgement, Awareness, Problem solving                   Current Attention Level: Sustained   Following Commands: Follows one step commands inconsistently, Follows multi-step commands inconsistently Safety/Judgement: Decreased awareness of safety, Decreased awareness of deficits Awareness: Anticipatory Problem Solving: Slow processing, Difficulty sequencing, Requires verbal cues, Requires tactile cues General Comments: Greatly improved today. Pt able to find room after leaving and recall number on return during second bout.        Exercises      General Comments General comments (skin integrity, edema, etc.): HR 120 at rest, up to 150 while ambulating with therapy.      Pertinent Vitals/Pain Pain Assessment Pain Assessment: No/denies pain Pain Location: I just don't feel good. (non specific) Pain Intervention(s): Monitored during session    Home Living Family/patient expects to be discharged to:: Private residence Living Arrangements: Alone                      Prior Function            PT Goals (current goals can now be found in the care plan section) Acute Rehab PT Goals Patient Stated Goal: lay down PT Goal Formulation: With patient Time For Goal Achievement: 07/02/22 Potential to Achieve Goals: Good Progress towards PT goals: Progressing toward goals    Frequency    Min 3X/week      PT Plan Current plan remains appropriate    Co-evaluation              AM-PAC PT "6 Clicks" Mobility   Outcome Measure  Help needed turning from your back to your side while in a  flat bed without using bedrails?: A Little Help needed moving from lying on your back to sitting on the side of a flat bed without using bedrails?: A Little Help needed moving to and from a bed to a chair (including a wheelchair)?: A Little Help needed standing up from a chair using your arms (e.g., wheelchair or bedside chair)?: A Little Help needed to walk in hospital room?: A Little Help needed climbing 3-5 steps with a railing? : Total 6 Click Score: 16    End of Session Equipment Utilized During Treatment: Gait belt Activity Tolerance: Patient tolerated treatment well Patient left: with call bell/phone within reach;with bed alarm set;in bed Nurse Communication: Mobility status PT Visit Diagnosis: Unsteadiness on feet (R26.81);Other abnormalities of gait and mobility (R26.89);Muscle weakness (generalized) (M62.81);Difficulty in walking, not elsewhere classified (R26.2);Dizziness and giddiness (R42)     Time: 5361-4431 PT Time Calculation (min) (ACUTE ONLY): 14 min  Charges:  $Gait Training: 8-22 mins                     Kathlyn Sacramento, PT    Berton Mount 06/25/2022, 1:57 PM

## 2022-06-25 NOTE — Progress Notes (Signed)
   Providing Compassionate, Quality Care - Together   Subjective: Patient reports no new issues.  Objective: Vital signs in last 24 hours: Temp:  [97.6 F (36.4 C)-98.7 F (37.1 C)] 98.6 F (37 C) (08/11 0814) Pulse Rate:  [93-110] 93 (08/11 0814) Resp:  [16-18] 18 (08/11 0814) BP: (116-146)/(62-68) 146/68 (08/11 0814) SpO2:  [95 %-100 %] 100 % (08/11 0814) Weight:  [71.8 kg] 71.8 kg (08/11 0500)  Intake/Output from previous day: 08/10 0701 - 08/11 0700 In: 474 [P.O.:474] Out: 2375 [Urine:2375] Intake/Output this shift: Total I/O In: 240 [P.O.:240] Out: -   Patient is alert and pleasant Mildly confused Speech clear MAE, strength and sensation intact Incision is clean, dr, and intact; healing well  Lab Results: No results for input(s): "WBC", "HGB", "HCT", "PLT" in the last 72 hours. BMET No results for input(s): "NA", "K", "CL", "CO2", "GLUCOSE", "BUN", "CREATININE", "CALCIUM" in the last 72 hours.  Studies/Results: No results found.  Assessment/Plan: Patient underwent craniotomy for evacuation of subdural hematoma by Dr. Jordan Likes on 06/08/2022. He is slowly improving. Recommendation is for discharge to skilled nursing facility based on home situation and therapy recommendations.   LOS: 17 days   -Continue supportive care -Discharge plan is for SNF   Val Eagle, DNP, AGNP-C Nurse Practitioner  [facility] [address], [place], [place] [address] P: [phone]    F: [phone]  06/25/2022, 11:24 AM

## 2022-06-25 NOTE — TOC Progression Note (Signed)
Transition of Care [facility]) - Progression Note    Patient Details  Name: Jose Li MRN: [id number] Date of Birth: [birth year]  [facility] CM/SW Contact  Baldemar Lenis, [place] Phone Number: 06/25/2022, 2:41 PM  Clinical Narrative:   CSW discussed with neurosurgery NP about barrier to SNF placement being the incorrect documentation of patient having current cocaine use. CSW discussed with patient and significant other yesterday, and patient hasn't used cocaine in over 25 years. MD to update his H&P to take current substance use off of the documentation, as patient is not currently abusing any substances, just per history. CSW spoke with significant other, Jose Li, to provide update and she was appreciative of update and CSW advocating for patient. CSW to fax out referral after documentation updated to see if patient gets any other bed offers.    Expected Discharge Plan: Skilled Nursing Facility Barriers to Discharge: SNF Pending bed offer  Expected Discharge Plan and Services Expected Discharge Plan: Skilled Nursing Facility                                               Social Determinants of Health (SDOH) Interventions    Readmission Risk Interventions     No data to display

## 2022-06-26 LAB — GLUCOSE, CAPILLARY
Glucose-Capillary: 113 mg/dL — ABNORMAL HIGH (ref 70–99)
Glucose-Capillary: 115 mg/dL — ABNORMAL HIGH (ref 70–99)
Glucose-Capillary: 118 mg/dL — ABNORMAL HIGH (ref 70–99)
Glucose-Capillary: 127 mg/dL — ABNORMAL HIGH (ref 70–99)
Glucose-Capillary: 152 mg/dL — ABNORMAL HIGH (ref 70–99)
Glucose-Capillary: 191 mg/dL — ABNORMAL HIGH (ref 70–99)

## 2022-06-26 NOTE — Plan of Care (Signed)
  Problem: Education: Goal: Knowledge of the prescribed therapeutic regimen will improve Outcome: Progressing   Problem: Clinical Measurements: Goal: Neurologic status will improve Outcome: Progressing   Problem: Activity: Goal: Risk for activity intolerance will decrease Outcome: Progressing   Problem: Nutrition: Goal: Adequate nutrition will be maintained Outcome: Progressing   Problem: Coping: Goal: Level of anxiety will decrease Outcome: Progressing

## 2022-06-26 NOTE — Progress Notes (Signed)
Subjective: Patient reports  no complaints minimal headache improving  Objective: Vital signs in last 24 hours: Temp:  [98 F (36.7 C)-98.7 F (37.1 C)] 98.1 F (36.7 C) (08/12 0423) Pulse Rate:  [90-98] 90 (08/12 0423) Resp:  [17-20] 18 (08/11 2303) BP: (124-141)/(67-73) 125/70 (08/12 0423) SpO2:  [96 %-100 %] 96 % (08/12 0423) Weight:  [71.7 kg] 71.7 kg (08/12 0500)  Intake/Output from previous day: 08/11 0701 - 08/12 0700 In: 240 [P.O.:240] Out: 1250 [Urine:1250] Intake/Output this shift: No intake/output data recorded.  Awake alert incision clean dry and intact moves all extremities  Lab Results: No results for input(s): "WBC", "HGB", "HCT", "PLT" in the last 72 hours. BMET No results for input(s): "NA", "K", "CL", "CO2", "GLUCOSE", "BUN", "CREATININE", "CALCIUM" in the last 72 hours.  Studies/Results: No results found.  Assessment/Plan: Status postcraniotomy for subdural approximately 3 weeks ago waiting placement  LOS: 18 days     BREANNA MCDANIEL 06/26/2022, 9:04 AM

## 2022-06-27 LAB — GLUCOSE, CAPILLARY
Glucose-Capillary: 118 mg/dL — ABNORMAL HIGH (ref 70–99)
Glucose-Capillary: 110 mg/dL — ABNORMAL HIGH (ref 70–99)
Glucose-Capillary: 125 mg/dL — ABNORMAL HIGH (ref 70–99)
Glucose-Capillary: 127 mg/dL — ABNORMAL HIGH (ref 70–99)
Glucose-Capillary: 145 mg/dL — ABNORMAL HIGH (ref 70–99)

## 2022-06-27 NOTE — Progress Notes (Signed)
Neurosurgery Service Progress Note  Subjective: No acute events overnight, no new complaints   Objective: Vitals:   06/27/22 0331 06/27/22 0500 06/27/22 0740 06/27/22 1051  BP: 105/62  120/81 119/68  Pulse: 80  90 95  Resp: 16  18 16   Temp: 98.2 F (36.8 C)  98 F (36.7 C) 98.3 F (36.8 C)  TempSrc: Oral     SpO2: 97%  97% 98%  Weight:  71.7 kg    Height:        Physical Exam: Awake/alert, Ox3, Fcx4, incision c/d/i  Assessment & Plan: 66 y.o. man s/p craniotomy for SDH, recovering well.  -placement pending, no change in plan of care   77  06/27/22 2:50 PM

## 2022-06-28 LAB — GLUCOSE, CAPILLARY
Glucose-Capillary: 112 mg/dL — ABNORMAL HIGH (ref 70–99)
Glucose-Capillary: 119 mg/dL — ABNORMAL HIGH (ref 70–99)
Glucose-Capillary: 120 mg/dL — ABNORMAL HIGH (ref 70–99)
Glucose-Capillary: 237 mg/dL — ABNORMAL HIGH (ref 70–99)
Glucose-Capillary: 93 mg/dL (ref 70–99)

## 2022-06-28 MED ORDER — ENSURE ENLIVE PO LIQD
237.0000 mL | Freq: Three times a day (TID) | ORAL | Status: DC
  Administered 2022-06-28 – 2022-07-06 (×20): 237 mL via ORAL

## 2022-06-28 MED ORDER — NITROGLYCERIN 1 MG/10 ML FOR IR/CATH LAB
INTRA_ARTERIAL | Status: AC
  Filled 2022-06-28: qty 10

## 2022-06-28 NOTE — Progress Notes (Signed)
Physical Therapy Treatment Patient Details Name: Jose Li MRN: [id number] DOB: July 07, 1956 Today's Date: 06/28/2022   History of Present Illness Pt is 66 year old male found 06/07/22 on ground at home during wellness check  and admitted with subdural hematoma. TD:VVOHY acute hemispheric subdural hematoma on the left with mass effect. 7/25: Left frontotemporoparietal craniotomy and evacuation of acute posttraumatic subdural hematoma, placement of subdural drain (has been pulled). Pt pulled out Cortrak evening of 8/3.  PHMx: seizure disorder, alcohol disorder.    PT Comments    Continues to progress well towards acute rehab goals. Demonstrates increased tolerance with ambulation, up to min assist with turns for balance. HR 104 at rest, up to 149 while ambulating and fatigues. NMRE for balance training further limited by fatigue when HR elevated again. Quickly resolves to baseline in low 100s within 2 minutes. Patient will continue to benefit from skilled physical therapy services to further improve independence with functional mobility.    Recommendations for follow up therapy are one component of a multi-disciplinary discharge planning process, led by the attending physician.  Recommendations may be updated based on patient status, additional functional criteria and insurance authorization.  Follow Up Recommendations  Skilled nursing-short term rehab (<3 hours/day) Can patient physically be transported by private vehicle: Yes   Assistance Recommended at Discharge Frequent or constant Supervision/Assistance  Patient can return home with the following Assistance with cooking/housework;Direct supervision/assist for medications management;Direct supervision/assist for financial management;Assist for transportation;Help with stairs or ramp for entrance;Two people to help with bathing/dressing/bathroom;A little help with walking and/or transfers   Equipment Recommendations   (TBD next venue of care)     Recommendations for Other Services Rehab consult     Precautions / Restrictions Precautions Precautions: Fall Precaution Comments: watch HR tachy Restrictions Weight Bearing Restrictions: No     Mobility  Bed Mobility Overal bed mobility: Needs Assistance Bed Mobility: Supine to Sit, Sit to Supine     Supine to sit: Min assist Sit to supine: Supervision   General bed mobility comments: Min assist to come to EOB. Cues for technique, supervision to lie back down.    Transfers Overall transfer level: Needs assistance Equipment used: Rolling walker (2 wheels) Transfers: Sit to/from Stand Sit to Stand: Min assist           General transfer comment: Min assist first time with RW today, cues for hand and foot placement prior to rising and set-up closer to EOB. Min guard on additional trials.    Ambulation/Gait Ambulation/Gait assistance: Min assist Gait Distance (Feet): 150 Feet Assistive device: Rolling walker (2 wheels) Gait Pattern/deviations: Step-through pattern, Decreased stride length, Ataxic, Scissoring, Staggering left, Staggering right, Drifts right/left Gait velocity: decreased Gait velocity interpretation: <1.8 ft/sec, indicate of risk for recurrent falls   General Gait Details: Further gait training, with cues for wider BOS. Practiced scanning enviornmnet for orientation and hazard awareness. Min assist with turn x1 for balance, lifiting RW. Educated on keeping RW on floor with turns. Good control backing up however slightly over paced and cued to take his time. HR up to 149 during bout.   Stairs             Wheelchair Mobility    Modified Rankin (Stroke Patients Only) Modified Rankin (Stroke Patients Only) Pre-Morbid Rankin Score: No symptoms Modified Rankin: Moderately severe disability     Balance Overall balance assessment: Needs assistance Sitting-balance support: No upper extremity supported, Feet supported Sitting balance-Leahy  Scale: Good     Standing balance  support: No upper extremity supported, During functional activity Standing balance-Leahy Scale: Fair Standing balance comment: Challenged with NMRE balance activities including unsupported standing with WBOS, eyes open, eyes closed, horiziontal head turns, and vertical nods. Pt with light sway only. Static exercises tolerated for a short period of time, became fatigued with HR reaching 140s again.                            Cognition Arousal/Alertness: Awake/alert Behavior During Therapy: Flat affect Overall Cognitive Status: Impaired/Different from baseline Area of Impairment: Following commands, Safety/judgement, Awareness, Problem solving                       Following Commands: Follows one step commands inconsistently, Follows multi-step commands inconsistently Safety/Judgement: Decreased awareness of safety, Decreased awareness of deficits Awareness: Anticipatory Problem Solving: Slow processing, Difficulty sequencing, Requires verbal cues, Requires tactile cues General Comments: Further improvement, still with some inconsistency in command following        Exercises      General Comments General comments (skin integrity, edema, etc.): HR 104 at rest, 149 max with ambulation.      Pertinent Vitals/Pain Pain Assessment Pain Assessment: No/denies pain Pain Intervention(s): Monitored during session    Home Living                          Prior Function            PT Goals (current goals can now be found in the care plan section) Acute Rehab PT Goals Patient Stated Goal: lay down PT Goal Formulation: With patient Time For Goal Achievement: 07/02/22 Potential to Achieve Goals: Good Progress towards PT goals: Progressing toward goals    Frequency    Min 3X/week      PT Plan Current plan remains appropriate    Co-evaluation              AM-PAC PT "6 Clicks" Mobility   Outcome Measure   Help needed turning from your back to your side while in a flat bed without using bedrails?: A Little Help needed moving from lying on your back to sitting on the side of a flat bed without using bedrails?: A Little Help needed moving to and from a bed to a chair (including a wheelchair)?: A Little Help needed standing up from a chair using your arms (e.g., wheelchair or bedside chair)?: A Little Help needed to walk in hospital room?: A Little Help needed climbing 3-5 steps with a railing? : Total 6 Click Score: 16    End of Session Equipment Utilized During Treatment: Gait belt Activity Tolerance: Patient tolerated treatment well Patient left: with call bell/phone within reach;with bed alarm set;in bed Nurse Communication: Mobility status PT Visit Diagnosis: Unsteadiness on feet (R26.81);Other abnormalities of gait and mobility (R26.89);Muscle weakness (generalized) (M62.81);Difficulty in walking, not elsewhere classified (R26.2);Dizziness and giddiness (R42)     Time: 4163-8453 PT Time Calculation (min) (ACUTE ONLY): 17 min  Charges:  $Gait Training: 8-22 mins                     Kathlyn Sacramento, PT    Berton Mount 06/28/2022, 3:28 PM

## 2022-06-28 NOTE — Progress Notes (Signed)
Nutrition Follow-up  DOCUMENTATION CODES:  Non-severe (moderate) malnutrition in context of chronic illness  INTERVENTION:  Continue current diet as ordered, encourage PO intake Continue vitamin regimen Increase Ensure Enlive po TID, each supplement provides 350 kcal and 20 grams of protein.  NUTRITION DIAGNOSIS:  Moderate Malnutrition related to social / environmental circumstances as evidenced by moderate muscle depletion, severe muscle depletion, moderate fat depletion. - remains applicable  GOAL:  Patient will meet greater than or equal to 90% of their needs - oral diet in place, drinking ensure  MONITOR:  Labs, Weight trends, TF tolerance  REASON FOR ASSESSMENT:  Other (Comment) (NPO, Cortrak order) Enteral/tube feeding initiation and management  ASSESSMENT:  66 yo male admitted with L SDH requiring craniotomy with hematoma evacuation. PMH includes EtOH abuse, seizure disorder, HTN, HLD, depression, anxiety. Surgial hx includes trach s/p removal, hernia repair and R hip and femur fx  7/25 - Left frontotemporoparietal craniotomy and evacuation of acute posttraumatic subdural hematoma, placement of subdural drain 7/27 - s/p cortrak placement; tip in proximal duodenum per xray  8/4 - pt pulled cortrak, replaced later that day 8/7 - SLP advanced diet to regular, cortrak removed  Pt resting in bed at the time of assessment. States he has been eating well and drinking his ensures. Prefers chocolate flavor. Good intake recorded in flowsheet, weight seems to be slightly decreased over the past week. Will increase ensure to TID  Average Meal Intake: 8/7-8/11: 85% average intake x 5 recorded meals   Nutritionally Relevant Medications: Scheduled Meds:  atorvastatin  20 mg Oral Daily   docusate  100 mg Oral BID   doxazosin  1 mg Oral Daily   feeding supplement  237 mL Oral BID BM   insulin aspart  0-15 Units Subcutaneous Q4H   multivitamin with minerals  1 tablet Oral Daily    pantoprazole  40 mg Oral QHS   potassium chloride  20 mEq Oral Daily   thiamine  100 mg Oral Daily   Labs Reviewed, none drawn since 8/3  NUTRITION - FOCUSED PHYSICAL EXAM: Flowsheet Row Most Recent Value  Upper Arm Region Mild depletion  Thoracic and Lumbar Region Moderate depletion  Buccal Region Moderate depletion  Temple Region Moderate depletion  Clavicle Bone Region Moderate depletion  Clavicle and Acromion Bone Region Moderate depletion  Scapular Bone Region Moderate depletion  Dorsal Hand Unable to assess  Patellar Region Severe depletion  Anterior Thigh Region Severe depletion  Posterior Calf Region Severe depletion  Mouth --  [pale tongue, edentulous (wears dentures but not in currently)]  Skin --  [ecchymosis]   Diet Order:   Diet Order             Diet regular Room service appropriate? Yes; Fluid consistency: Thin  Diet effective now                   EDUCATION NEEDS:  No education needs have been identified at this time  Skin:  Skin Assessment: Skin Integrity Issues: Skin Integrity Issues:: Incisions Incisions: head-post craniotomy  Last BM:  8/13  Height:  Ht Readings from Last 1 Encounters:  06/21/22 6' (1.829 m)    Weight:  Wt Readings from Last 1 Encounters:  06/28/22 71.9 kg    Ideal Body Weight:  80.9 kg  BMI:  Body mass index is 21.5 kg/m.  Estimated Nutritional Needs:  Kcal:  2000-2200 kcals Protein:  100-115 g Fluid:  >/= 2 L   Greig Castilla, RD, LDN Clinical Dietitian  RD pager # available in [facility]  After hours/weekend pager # available in [facility]

## 2022-06-28 NOTE — Progress Notes (Signed)
   Providing Compassionate, Quality Care - Together   Subjective: Patient reports no issues overnight. He is resting comfortably in bed.  Objective: Vital signs in last 24 hours: Temp:  [98.2 F (36.8 C)-98.6 F (37 C)] 98.5 F (36.9 C) (08/14 0846) Pulse Rate:  [92-99] 97 (08/14 0846) Resp:  [16-19] 16 (08/14 0846) BP: (116-131)/(67-75) 131/72 (08/14 0846) SpO2:  [97 %-99 %] 97 % (08/14 0846) Weight:  [71.9 kg] 71.9 kg (08/14 0447)  Intake/Output from previous day: 08/13 0701 - 08/14 0700 In: 240 [P.O.:240] Out: 1000 [Urine:1000] Intake/Output this shift: Total I/O In: 320 [P.O.:320] Out: -   Patient responds to voice Mildly confused Speech clear MAE, strength and sensation intact Incision is clean, dry, and intact; healing well  Lab Results: No results for input(s): "WBC", "HGB", "HCT", "PLT" in the last 72 hours. BMET No results for input(s): "NA", "K", "CL", "CO2", "GLUCOSE", "BUN", "CREATININE", "CALCIUM" in the last 72 hours.  Studies/Results: No results found.  Assessment/Plan: Patient underwent craniotomy for evacuation of subdural hematoma by Dr. Jordan Likes on 06/08/2022. He is slowly improving. Recommendation is for discharge to skilled nursing facility based on home situation and therapy recommendations.   LOS: 20 days   -Continue supportive care -Discharge plan is for SNF   Val Eagle, DNP, AGNP-C Nurse Practitioner  [facility] [address], [address], [place], [place] [address] P: [phone]    F: [phone]  06/28/2022, 11:10 AM

## 2022-06-29 MED ORDER — DOXAZOSIN MESYLATE 1 MG PO TABS: 1.0000 mg | ORAL_TABLET | Freq: Every day | ORAL | Status: DC

## 2022-06-29 MED ORDER — ENSURE ENLIVE PO LIQD: 237.0000 mL | Freq: Three times a day (TID) | ORAL | 12 refills | Status: DC

## 2022-06-29 MED ORDER — CLONIDINE HCL 0.1 MG PO TABS: 0.1000 mg | ORAL_TABLET | Freq: Three times a day (TID) | ORAL | 0 refills | Status: DC

## 2022-06-29 MED ORDER — ACETAMINOPHEN 325 MG PO TABS: 650.0000 mg | ORAL_TABLET | ORAL | Status: DC | PRN

## 2022-06-29 MED ORDER — NICOTINE 7 MG/24HR TD PT24: 7.0000 mg | MEDICATED_PATCH | Freq: Every day | TRANSDERMAL | 0 refills | Status: DC

## 2022-06-29 MED ORDER — PREGABALIN 75 MG PO CAPS: 75.0000 mg | ORAL_CAPSULE | Freq: Two times a day (BID) | ORAL | 0 refills | Status: DC

## 2022-06-29 MED ORDER — QUETIAPINE FUMARATE 50 MG PO TABS: 50.0000 mg | ORAL_TABLET | Freq: Two times a day (BID) | ORAL | Status: DC

## 2022-06-29 NOTE — TOC Progression Note (Signed)
Transition of Care [facility]) - Progression Note    Patient Details  Name: NORMON PETTIJOHN MRN: [id number] Date of Birth: [birth year]  [facility]) CM/SW Contact  Baldemar Lenis, [place] Phone Number: 06/29/2022, 11:09 AM  Clinical Narrative:   CSW asked [facility] about a private room, but they only have semi private available. CSW checked with [facility], and they would have a private room. CSW spoke with significant other, Gunnar Fusi, to update on bed availability. Gunnar Fusi would still like to choose [facility], and will update the patient that he will just have to share a room for a while. CSW submitted for insurance authorization. Patient's PASRR is also still pending, has been pending Level 2 review since 8/8 at 1432. CSW notified [facility] Supervisor who is investigating PASRR delay. CSW to follow.    Expected Discharge Plan: Skilled Nursing Facility Barriers to Discharge: Awaiting State Approval (PASRR)  Expected Discharge Plan and Services Expected Discharge Plan: Skilled Nursing Facility                                               Social Determinants of Health (SDOH) Interventions    Readmission Risk Interventions     No data to display

## 2022-06-29 NOTE — Progress Notes (Signed)
Speech Language Pathology Treatment: Dysphagia;Cognitive-Linquistic  Patient Details Name: Jose Li MRN: [id number] DOB: [birth year] Today's Date: 06/29/2022 Time: 2376-2831 SLP Time Calculation (min) (ACUTE ONLY): 13 min  Assessment / Plan / Recommendation Clinical Impression  Pt partially reclined in bed, consuming POs upon SLP arrival. Assisted/cued pt for upright positioning and provided additional POs for assessment of diet tolerance. Without dentures in place, pt consumed regular textures and thin liquids by straw with x1 brief throat clear with solid across multiple trials. Oral prep was swift and he completely cleared oral cavity of POs independently. RN/pt report no difficulties with regular diet/thin liquids or consumption of whole meds with liquid. Given clinical presentation and reports of tolerance, recommend continue current diet with adherence to universal swallow precautions. No further SLP f/u warranted for swallowing.  Treatment of cognitive functions this date focused on verbal problem solving related to home safety and management of health conditions. When given a verbal scenario, pt identified problem and appropriate solutions with 65% accuracy given mod verbal/question cues. Pt with greatest difficulty providing effects of poor decisions on health/safety, requiring increased cueing. Will continue f/u for treatment of cognition.    HPI HPI: Pt is a 66 yo male found down during wellness check on evening of 7/24.  Pt now s/p craniotomy on 7/25 for evacuation of SDH.  Most recent CT 7/29: "IMPRESSION: 1. Postoperative changes from recent left frontotemporal craniotomy for subdural evacuation, with interval removal of a subdural drain.  Residual heterogeneous left subdural collection measures up to 14 mm in thickness, similar to previous. Persistent mass effect on the  subjacent left cerebral hemisphere with 3 mm left-to-right shift. 2. Persistent and mildly increased pneumocephalus  from prior, most  pronounced over the anterior right greater than left frontal convexities with mass effect on the subjacent brain parenchyma. 3. Interval decrease in hyperdense blood products about the previously seen smaller right subdural hematoma. Majority of this collection now has an appearance of a hypodense hygroma and measures up to 8 mm in maximal thickness. 4. Continued interval decrease in size of hemorrhagic contusion  involving the left superior frontal gyrus. Additional scattered  intra-axial hemorrhage also continues to decrease. Trace IVH not significantly changed. 5. No other new acute intracranial abnormality."  CXR 7/24 with no active disease. Pt with PMHx of HTN, anemia, anxiety, HLD, seizures on Keppra 1500 twice daily, CKD 3B, EtOH abuse, orthostatic hypotension (follows cardiology, takes midodrine), OSA on BiPAP.      SLP Plan  Goals updated;Continue with current plan of care      Recommendations for follow up therapy are one component of a multi-disciplinary discharge planning process, led by the attending physician.  Recommendations may be updated based on patient status, additional functional criteria and insurance authorization.    Recommendations  Diet recommendations: Regular;Thin liquid Liquids provided via: Cup;Straw Medication Administration: Whole meds with liquid Supervision: Patient able to self feed Compensations: Slow rate;Small sips/bites;Follow solids with liquid Postural Changes and/or Swallow Maneuvers: Seated upright 90 degrees                Oral Care Recommendations: Oral care BID Follow Up Recommendations: Skilled nursing-short term rehab (<3 hours/day) Assistance recommended at discharge: Frequent or constant Supervision/Assistance SLP Visit Diagnosis: Dysphagia, unspecified (R13.10);Cognitive communication deficit (R41.841) Plan: Goals updated;Continue with current plan of care            Jose Echevaria, MA, CCC-SLP Acute  Rehabilitation Services Office Number: [phone]  Jose Li  06/29/2022, 11:23 AM

## 2022-06-29 NOTE — TOC Progression Note (Signed)
Transition of Care [facility]) - Progression Note    Patient Details  Name: Jose Li MRN: [id number] Date of Birth: January 29, 1956  Transition of Care [facility]) CM/SW Contact  Baldemar Lenis, [place] Phone Number: 06/29/2022, 11:08 AM  Clinical Narrative:   CSW refaxed patient out this morning, obtained new bed offers. CSW spoke with significant other, Gunnar Fusi, to provide updated bed offers. Gunnar Fusi reviewed, would like to choose [facility], and asking for a private room. CSW to follow.    Expected Discharge Plan: Skilled Nursing Facility Barriers to Discharge: SNF Pending bed offer  Expected Discharge Plan and Services Expected Discharge Plan: Skilled Nursing Facility                                               Social Determinants of Health (SDOH) Interventions    Readmission Risk Interventions     No data to display

## 2022-06-29 NOTE — Progress Notes (Signed)
Occupational Therapy Treatment Patient Details Name: Jose Li MRN: [id number] DOB: 1956/08/03 Today's Date: 06/29/2022   History of present illness Pt is 66 year old male found 06/07/22 on ground at home during wellness check  and admitted with subdural hematoma. XB:MWUXL acute hemispheric subdural hematoma on the left with mass effect. 7/25: Left frontotemporoparietal craniotomy and evacuation of acute posttraumatic subdural hematoma, placement of subdural drain (has been pulled). Pt pulled out Cortrak evening of 8/3.  PHMx: seizure disorder, alcohol disorder.   OT comments  Pt progressing towards goals, needing min-mod A for ADLs, min guard for bed mobility,and min guard for transfers with RW. Pt with HR up to 160 with short distance ambulation to sink for standing grooming task, pt able to identify fatigue and need to sit/rest during session. Pt able to complete there ex seated EOB. Pt presenting with impairments listed below, will follow acutely. Continue to recommend SNF at d/c.   Recommendations for follow up therapy are one component of a multi-disciplinary discharge planning process, led by the attending physician.  Recommendations may be updated based on patient status, additional functional criteria and insurance authorization.    Follow Up Recommendations  Skilled nursing-short term rehab (<3 hours/day)    Assistance Recommended at Discharge Frequent or constant Supervision/Assistance  Patient can return home with the following  Two people to help with walking and/or transfers;A lot of help with bathing/dressing/bathroom;Assistance with cooking/housework;Assistance with feeding;Help with stairs or ramp for entrance;Assist for transportation;Direct supervision/assist for financial management;Direct supervision/assist for medications management   Equipment Recommendations  Other (comment) (defer)    Recommendations for Other Services PT consult    Precautions / Restrictions  Precautions Precautions: Fall Precaution Comments: watch HR tachy Restrictions Weight Bearing Restrictions: No       Mobility Bed Mobility Overal bed mobility: Needs Assistance Bed Mobility: Supine to Sit, Sit to Supine Rolling: Min guard   Supine to sit: Min guard Sit to supine: Min guard        Transfers Overall transfer level: Needs assistance Equipment used: Rolling walker (2 wheels) Transfers: Sit to/from Stand Sit to Stand: Min guard                 Balance Overall balance assessment: Needs assistance Sitting-balance support: No upper extremity supported, Feet supported Sitting balance-Leahy Scale: Good Sitting balance - Comments: can reach outsie BOS without LOB   Standing balance support: No upper extremity supported, During functional activity Standing balance-Leahy Scale: Fair                             ADL either performed or assessed with clinical judgement   ADL Overall ADL's : Needs assistance/impaired     Grooming: Wash/dry face;Standing;Supervision/safety Grooming Details (indicate cue type and reason): standing at sink         Upper Body Dressing : Minimal assistance;Sitting Upper Body Dressing Details (indicate cue type and reason): donning gown sitting EOB Lower Body Dressing: Moderate assistance;Sitting/lateral leans Lower Body Dressing Details (indicate cue type and reason): pt able to doff socks independently, max A to don sitting EOB             Functional mobility during ADLs: Minimal assistance;Rolling walker (2 wheels)      Extremity/Trunk Assessment Upper Extremity Assessment Upper Extremity Assessment: Generalized weakness RUE Coordination: decreased fine motor LUE Coordination: decreased fine motor   Lower Extremity Assessment Lower Extremity Assessment: Defer to PT evaluation  Vision   Additional Comments: [facility]   Perception Perception Perception: Not tested   Praxis Praxis Praxis: Not  tested    Cognition Arousal/Alertness: Awake/alert Behavior During Therapy: Flat affect Overall Cognitive Status: Impaired/Different from baseline Area of Impairment: Following commands, Safety/judgement, Awareness, Problem solving                 Orientation Level: Disoriented to, Place, Time Current Attention Level: Sustained   Following Commands: Follows one step commands inconsistently, Follows multi-step commands inconsistently Safety/Judgement: Decreased awareness of safety, Decreased awareness of deficits Awareness: Anticipatory Problem Solving: Slow processing, Difficulty sequencing, Requires verbal cues, Requires tactile cues General Comments: able to identify when fatigued/need to sit        Exercises Exercises: Other exercises Other Exercises Other Exercises: seated marching x10 Other Exercises: seated kicks at EOB x10 Other Exercises: bil UE functional reach task x10    Shoulder Instructions       General Comments HR up to 160 with short distance ambulation and standing ADL at sink    Pertinent Vitals/ Pain       Pain Assessment Pain Assessment: Faces Pain Score: 2  Faces Pain Scale: Hurts a little bit Pain Location: pt unable to specify Pain Descriptors / Indicators: Discomfort Pain Intervention(s): Limited activity within patient's tolerance, Monitored during session  Home Living                                          Prior Functioning/Environment              Frequency  Min 2X/week        Progress Toward Goals  OT Goals(current goals can now be found in the care plan section)  Progress towards OT goals: Progressing toward goals  Acute Rehab OT Goals Patient Stated Goal: to go to rehab OT Goal Formulation: With patient/family Time For Goal Achievement: 07/13/22 Potential to Achieve Goals: Good ADL Goals Additional ADL Goal #2: Pt will complete 3 step trailmaking task in prep for ADLs  Plan Discharge plan  needs to be updated    Co-evaluation                 AM-PAC OT "6 Clicks" Daily Activity     Outcome Measure   Help from another person eating meals?: A Little Help from another person taking care of personal grooming?: A Lot Help from another person toileting, which includes using toliet, bedpan, or urinal?: A Lot Help from another person bathing (including washing, rinsing, drying)?: A Lot Help from another person to put on and taking off regular upper body clothing?: A Little Help from another person to put on and taking off regular lower body clothing?: A Lot 6 Click Score: 14    End of Session Equipment Utilized During Treatment: Gait belt;Rolling walker (2 wheels)  OT Visit Diagnosis: Unsteadiness on feet (R26.81);Other abnormalities of gait and mobility (R26.89);Other symptoms and signs involving cognitive function   Activity Tolerance Patient tolerated treatment well   Patient Left in bed;with call bell/phone within reach;with bed alarm set   Nurse Communication Mobility status        Time: 7564-3329 OT Time Calculation (min): 18 min  Charges: OT General Charges $OT Visit: 1 Visit OT Treatments $Self Care/Home Management : 8-22 mins  Alfonzo Beers, OTD, OTR/L Acute Rehab [phone]   Mayer Masker 06/29/2022, 10:59 AM

## 2022-06-29 NOTE — Progress Notes (Signed)
   Providing Compassionate, Quality Care - Together   Subjective: Patient reports he's ready to go home. No new issues overnight.  Objective: Vital signs in last 24 hours: Temp:  [97.8 F (36.6 C)-98.7 F (37.1 C)] 98.1 F (36.7 C) (08/15 0723) Pulse Rate:  [79-96] 79 (08/15 0723) Resp:  [14-20] 16 (08/15 0723) BP: (123-135)/(65-75) 135/75 (08/15 0723) SpO2:  [97 %-99 %] 97 % (08/15 0723)  Intake/Output from previous day: 08/14 0701 - 08/15 0700 In: 980 [P.O.:980] Out: 1550 [Urine:1550] Intake/Output this shift: No intake/output data recorded.  Patient is alert and pleasant Mildly confused Speech clear MAE, strength and sensation intact Incision is clean, dr, and intact; healing well  Lab Results: No results for input(s): "WBC", "HGB", "HCT", "PLT" in the last 72 hours. BMET No results for input(s): "NA", "K", "CL", "CO2", "GLUCOSE", "BUN", "CREATININE", "CALCIUM" in the last 72 hours.  Studies/Results: No results found.  Assessment/Plan: Patient underwent craniotomy for evacuation of subdural hematoma by Dr. Jordan Likes on 06/08/2022. He is slowly improving. Recommendation is for discharge to skilled nursing facility based on home situation and therapy recommendations. CSW is working on placement at [facility].    LOS: 21 days   -Continue supportive care -Pt is ready for discharge when SNF bed is available.   Val Eagle, DNP, AGNP-C Nurse Practitioner  [facility] [address], [address], [place], [place] [address] P: [phone]    F: [phone]  06/29/2022, 11:27 AM

## 2022-06-29 NOTE — Plan of Care (Signed)
  Problem: Coping: Goal: Level of anxiety will decrease Outcome: Progressing   Problem: Safety: Goal: Ability to remain free from injury will improve Outcome: Progressing   Problem: Skin Integrity: Goal: Risk for impaired skin integrity will decrease Outcome: Progressing   

## 2022-06-30 LAB — GLUCOSE, CAPILLARY: Glucose-Capillary: 177 mg/dL — ABNORMAL HIGH (ref 70–99)

## 2022-06-30 NOTE — Progress Notes (Signed)
   Providing Compassionate, Quality Care - Together   Subjective: Patient reports no new issues. He's states he's ready to get out of the hospital.  Objective: Vital signs in last 24 hours: Temp:  [97.5 F (36.4 C)-99 F (37.2 C)] 97.5 F (36.4 C) (08/16 1131) Pulse Rate:  [70-110] 110 (08/16 1131) Resp:  [16-20] 18 (08/16 1131) BP: (123-150)/(64-78) 123/66 (08/16 1131) SpO2:  [96 %-100 %] 97 % (08/16 1131)  Intake/Output from previous day: No intake/output data recorded. Intake/Output this shift: Total I/O In: -  Out: 650 [Urine:650]  Patient is alert and oriented x 4 Speech clear MAE, strength and sensation intact Incision is clean, dr, and intact; healing well  Lab Results: No results for input(s): "WBC", "HGB", "HCT", "PLT" in the last 72 hours. BMET No results for input(s): "NA", "K", "CL", "CO2", "GLUCOSE", "BUN", "CREATININE", "CALCIUM" in the last 72 hours.  Studies/Results: No results found.  Assessment/Plan: Patient underwent craniotomy for evacuation of subdural hematoma by Dr. Jordan Likes on 06/08/2022. His mental status has improved tremendously. Recommendation is for discharge to skilled nursing facility based on home situation and therapy recommendations. CSW is working on placement at [facility].   LOS: 22 days   -Discharge to SNF when bed available   Val Eagle, DNP, AGNP-C Nurse Practitioner  [facility] [address], [address], [place], [place] [address] P: [phone]    F: [phone]  06/30/2022, 11:51 AM

## 2022-06-30 NOTE — Progress Notes (Signed)
Physical Therapy Treatment Patient Details Name: Jose Li MRN: [id number] DOB: 1956-02-19 Today's Date: 06/30/2022   History of Present Illness Pt is 67 year old male found 06/07/22 on ground at home during wellness check  and admitted with subdural hematoma. WU:[id number] acute hemispheric subdural hematoma on the left with mass effect. 7/25: Left frontotemporoparietal craniotomy and evacuation of acute posttraumatic subdural hematoma, placement of subdural drain (has been pulled). Pt pulled out Cortrak evening of 8/3.  PHMx: seizure disorder, alcohol disorder.    PT Comments    Further progress towards functional goals. Increasing safety with gait, practiced with RW and again with single rail in hallway. Requires intermittent Min A due to LOB. Pt with improved awareness of deficits today. HR elevated to 156 BPM at peak both bouts of 150 feet today. Patient will continue to benefit from skilled physical therapy services to further improve independence with functional mobility.    Recommendations for follow up therapy are one component of a multi-disciplinary discharge planning process, led by the attending physician.  Recommendations may be updated based on patient status, additional functional criteria and insurance authorization.  Follow Up Recommendations  Skilled nursing-short term rehab (<3 hours/day) Can patient physically be transported by private vehicle: Yes   Assistance Recommended at Discharge Frequent or constant Supervision/Assistance  Patient can return home with the following Assistance with cooking/housework;Direct supervision/assist for medications management;Direct supervision/assist for financial management;Assist for transportation;Help with stairs or ramp for entrance;Two people to help with bathing/dressing/bathroom;A little help with walking and/or transfers   Equipment Recommendations   (TBD next venue of care)    Recommendations for Other Services Rehab consult      Precautions / Restrictions Precautions Precautions: Fall Precaution Comments: watch HR tachy Restrictions Weight Bearing Restrictions: No     Mobility  Bed Mobility Overal bed mobility: Needs Assistance Bed Mobility: Supine to Sit, Sit to Supine     Supine to sit: Min assist Sit to supine: Supervision   General bed mobility comments: Min assist to lightly bring patient's trunk to EOB as he was struggling a bit today to come fully upright. Able to safely get back into bed with supervision, good control.    Transfers Overall transfer level: Needs assistance Equipment used: Rolling walker (2 wheels) Transfers: Sit to/from Stand Sit to Stand: Min guard           General transfer comment: Min guard for safety, good strength to power-up today into standing. cues for hand placement, no LOB with RW for support..    Ambulation/Gait Ambulation/Gait assistance: Min assist Gait Distance (Feet): 150 Feet (x2) Assistive device: Rolling walker (2 wheels) Gait Pattern/deviations: Step-through pattern, Decreased stride length, Ataxic, Scissoring, Staggering left, Staggering right, Drifts right/left, Trendelenburg Gait velocity: decreased Gait velocity interpretation: <1.8 ft/sec, indicate of risk for recurrent falls   General Gait Details: Min assist intermittently for balance. Reviewed RW use, early awareness of obstacles to avoid. Practiced further gait challenge with hand on rail in hallway, intermittent stumbling but no buckling of LEs. Shows slight trendelenburg on Rt noted today. HR up to 156 BPM ea bout completed today.   Stairs             Wheelchair Mobility    Modified Rankin (Stroke Patients Only) Modified Rankin (Stroke Patients Only) Pre-Morbid Rankin Score: No symptoms Modified Rankin: Moderately severe disability     Balance Overall balance assessment: Needs assistance Sitting-balance support: No upper extremity supported, Feet supported Sitting  balance-Leahy Scale: Good Sitting balance - Comments: can reach  outsie BOS without LOB   Standing balance support: No upper extremity supported, During functional activity Standing balance-Leahy Scale: Fair                              Cognition Arousal/Alertness: Awake/alert Behavior During Therapy: Flat affect Overall Cognitive Status: Impaired/Different from baseline Area of Impairment: Following commands, Safety/judgement, Awareness, Problem solving, Memory                     Memory: Decreased short-term memory Following Commands: Follows multi-step commands inconsistently, Follows one step commands consistently, Follows one step commands with increased time Safety/Judgement: Decreased awareness of safety Awareness: Anticipatory Problem Solving: Slow processing, Difficulty sequencing, Requires verbal cues General Comments: Recognized impaired balance, asks for RW for safety but neglects when attempting to approach bed to sit.        Exercises      General Comments General comments (skin integrity, edema, etc.): HR 156 ambulating. 102 at rest.      Pertinent Vitals/Pain Pain Assessment Pain Assessment: 0-10 Pain Score: 3  Pain Location: Head Pain Descriptors / Indicators: Discomfort Pain Intervention(s): Monitored during session    Home Living                          Prior Function            PT Goals (current goals can now be found in the care plan section) Acute Rehab PT Goals Patient Stated Goal: lay down PT Goal Formulation: With patient Time For Goal Achievement: 07/02/22 Potential to Achieve Goals: Good Progress towards PT goals: Progressing toward goals    Frequency    Min 3X/week      PT Plan Current plan remains appropriate    Co-evaluation              AM-PAC PT "6 Clicks" Mobility   Outcome Measure  Help needed turning from your back to your side while in a flat bed without using bedrails?: A  Little Help needed moving from lying on your back to sitting on the side of a flat bed without using bedrails?: A Little Help needed moving to and from a bed to a chair (including a wheelchair)?: A Little Help needed standing up from a chair using your arms (e.g., wheelchair or bedside chair)?: A Little Help needed to walk in hospital room?: A Little Help needed climbing 3-5 steps with a railing? : Total 6 Click Score: 16    End of Session Equipment Utilized During Treatment: Gait belt Activity Tolerance: Patient tolerated treatment well Patient left: with call bell/phone within reach;with bed alarm set;in bed Nurse Communication: Mobility status PT Visit Diagnosis: Unsteadiness on feet (R26.81);Other abnormalities of gait and mobility (R26.89);Muscle weakness (generalized) (M62.81);Difficulty in walking, not elsewhere classified (R26.2);Dizziness and giddiness (R42)     Time: 1610-9604 PT Time Calculation (min) (ACUTE ONLY): 13 min  Charges:  $Gait Training: 8-22 mins                     Kathlyn Sacramento, PT    [place] Mount 06/30/2022, 10:09 AM

## 2022-06-30 NOTE — Plan of Care (Signed)
  Problem: Coping: Goal: Level of anxiety will decrease Outcome: Progressing   Problem: Elimination: Goal: Will not experience complications related to bowel motility Outcome: Progressing   Problem: Pain Managment: Goal: General experience of comfort will improve Outcome: Progressing   

## 2022-07-01 NOTE — Progress Notes (Signed)
No new issues or problems.  Patient still awaiting placement.

## 2022-07-01 NOTE — Progress Notes (Signed)
Pt was found on ground after ambulating self from toilet without alert for assistance. Pt denies head strike.   This nurse and staff assisted patient to bed.   Patient has baseline strength and full mobility of all limbs. PERRLA. Vitals have been charted. Small abrasion on L wrist/FA where watch was, apparently from watch scrapping skin during fall. C/o of right shin 'soreness', small skin abrasion on anterior shin. No bleeding noted.  Neurosurgical team notified, received call from Dr. Jenene Slicker, no new orders received. Monitor and report back if change in condition.

## 2022-07-01 NOTE — Progress Notes (Signed)
Occupational Therapy Treatment Patient Details Name: Jose Li MRN: [id number] DOB: December 16, 1955 Today's Date: 07/01/2022   History of present illness Pt is 66 year old male found 06/07/22 on ground at home during wellness check  and admitted with subdural hematoma. QM:VHQIO acute hemispheric subdural hematoma on the left with mass effect. 7/25: Left frontotemporoparietal craniotomy and evacuation of acute posttraumatic subdural hematoma, placement of subdural drain (has been pulled). Pt pulled out Cortrak evening of 8/3.  PHMx: seizure disorder, alcohol disorder.   OT comments  Patient received in supine and agreeable to OT session. Patient required min assist to get to EOB and min guard to stand to ambulate to sink. Patient able to stand at sink for grooming and UB bathing and required seated rest break following, Patient was min guard to transfer to recliner with verbal cues for safety and setup for breakfast. Acute OT to continue to follow.    Recommendations for follow up therapy are one component of a multi-disciplinary discharge planning process, led by the attending physician.  Recommendations may be updated based on patient status, additional functional criteria and insurance authorization.    Follow Up Recommendations  Skilled nursing-short term rehab (<3 hours/day)    Assistance Recommended at Discharge Frequent or constant Supervision/Assistance  Patient can return home with the following  Assistance with cooking/housework;Help with stairs or ramp for entrance;Assist for transportation;Direct supervision/assist for financial management;Direct supervision/assist for medications management;A little help with walking and/or transfers;A little help with bathing/dressing/bathroom   Equipment Recommendations  Other (comment) (defer)    Recommendations for Other Services      Precautions / Restrictions Precautions Precautions: Fall Precaution Comments: watch HR  tachy Restrictions Weight Bearing Restrictions: No       Mobility Bed Mobility Overal bed mobility: Needs Assistance Bed Mobility: Supine to Sit     Supine to sit: Min assist     General bed mobility comments: min assist with hand held assist to pull up for trunk    Transfers Overall transfer level: Needs assistance Equipment used: Rolling walker (2 wheels) Transfers: Sit to/from Stand Sit to Stand: Min guard           General transfer comment: cues for safety     Balance Overall balance assessment: Needs assistance Sitting-balance support: No upper extremity supported, Feet supported Sitting balance-Leahy Scale: Good     Standing balance support: No upper extremity supported, During functional activity Standing balance-Leahy Scale: Fair Standing balance comment: stood at sink with supervison while performing self care                           ADL either performed or assessed with clinical judgement   ADL Overall ADL's : Needs assistance/impaired Eating/Feeding: Set up;Sitting   Grooming: Wash/dry hands;Wash/dry face;Oral care;Brushing hair;Supervision/safety;Standing Grooming Details (indicate cue type and reason): standing at sink Upper Body Bathing: Minimal assistance;Standing Upper Body Bathing Details (indicate cue type and reason): at sink                           General ADL Comments: willing to perform grooming and UB bathing standing at sink    Extremity/Trunk Assessment              Vision       Perception     Praxis      Cognition Arousal/Alertness: Awake/alert Behavior During Therapy: Flat affect Overall Cognitive Status: Impaired/Different from baseline Area of Impairment:  Following commands, Safety/judgement, Awareness, Problem solving, Memory                 Orientation Level: Disoriented to, Place, Time Current Attention Level: Sustained Memory: Decreased short-term memory Following Commands:  Follows multi-step commands inconsistently, Follows one step commands consistently, Follows one step commands with increased time Safety/Judgement: Decreased awareness of safety Awareness: Anticipatory Problem Solving: Slow processing, Difficulty sequencing, Requires verbal cues General Comments: able to recall balance issues        Exercises      Shoulder Instructions       General Comments      Pertinent Vitals/ Pain       Pain Assessment Pain Assessment: Faces Faces Pain Scale: No hurt Pain Intervention(s): Monitored during session  Home Living                                          Prior Functioning/Environment              Frequency  Min 2X/week        Progress Toward Goals  OT Goals(current goals can now be found in the care plan section)  Progress towards OT goals: Progressing toward goals  Acute Rehab OT Goals Patient Stated Goal: get better OT Goal Formulation: With patient Time For Goal Achievement: 07/13/22 Potential to Achieve Goals: Good ADL Goals Pt Will Perform Grooming: with set-up;with supervision;standing Pt Will Perform Upper Body Bathing: with set-up;with supervision;sitting Pt Will Perform Lower Body Bathing: with set-up;with supervision;sit to/from stand Pt Will Perform Upper Body Dressing: with supervision;with set-up;sitting Pt Will Perform Lower Body Dressing: with set-up;with supervision;sit to/from stand Pt Will Transfer to Toilet: with supervision;ambulating;bedside commode Pt Will Perform Toileting - Clothing Manipulation and hygiene: with supervision;sit to/from stand Additional ADL Goal #1: Pt will be S in and OOB for basic ADLs Additional ADL Goal #2: Pt will complete 3 step trailmaking task in prep for ADLs  Plan Discharge plan needs to be updated    Co-evaluation                 AM-PAC OT "6 Clicks" Daily Activity     Outcome Measure   Help from another person eating meals?: A Little Help  from another person taking care of personal grooming?: A Little Help from another person toileting, which includes using toliet, bedpan, or urinal?: A Lot Help from another person bathing (including washing, rinsing, drying)?: A Lot Help from another person to put on and taking off regular upper body clothing?: A Little Help from another person to put on and taking off regular lower body clothing?: A Lot 6 Click Score: 15    End of Session Equipment Utilized During Treatment: Gait belt;Rolling walker (2 wheels)  OT Visit Diagnosis: Unsteadiness on feet (R26.81);Other abnormalities of gait and mobility (R26.89);Other symptoms and signs involving cognitive function   Activity Tolerance Patient tolerated treatment well   Patient Left in chair;with call bell/phone within reach;with chair alarm set   Nurse Communication Mobility status        Time: 4580-9983 OT Time Calculation (min): 20 min  Charges: OT General Charges $OT Visit: 1 Visit OT Treatments $Self Care/Home Management : 8-22 mins  Alfonse Flavors, OTA Acute Rehabilitation Services  Office [phone]   Dewain Penning 07/01/2022, 10:08 AM

## 2022-07-02 NOTE — Progress Notes (Signed)
Physical Therapy Treatment Patient Details Name: Jose Li MRN: [id number] DOB: [birth year] Today's Date: 07/02/2022   History of Present Illness Pt is 66 year old male found 06/07/22 on ground at home during wellness check  and admitted with subdural hematoma. HW:EXHBZ acute hemispheric subdural hematoma on the left with mass effect. 7/25: Left frontotemporoparietal craniotomy and evacuation of acute posttraumatic subdural hematoma, placement of subdural drain (has been pulled). Pt pulled out Cortrak evening of 8/3.  PHMx: seizure disorder, alcohol disorder.    PT Comments    Patient progressing well towards PT goals. Reports feeling sleepy today. Session focused on progressive ambulation and awareness of safety. Requires Min A for bed mobility and gait training with use of RW for support due to veering left, cues for RW management and overall balance. HR up to 130s bpm with activity observed. Reports dizziness when standing impulsively initially needing to sit down to allow symptoms to resolve. Continues to have deficits relating to safety and awareness. Recommend walking a few times daily with staff. Continues to be appropriate for SNF. Will follow.    Recommendations for follow up therapy are one component of a multi-disciplinary discharge planning process, led by the attending physician.  Recommendations may be updated based on patient status, additional functional criteria and insurance authorization.  Follow Up Recommendations  Skilled nursing-short term rehab (<3 hours/day) Can patient physically be transported by private vehicle: Yes   Assistance Recommended at Discharge Frequent or constant Supervision/Assistance  Patient can return home with the following Assistance with cooking/housework;Direct supervision/assist for medications management;Direct supervision/assist for financial management;Assist for transportation;Help with stairs or ramp for entrance;Two people to help with  bathing/dressing/bathroom;A little help with walking and/or transfers   Equipment Recommendations  Other (comment) (defer to next venue)    Recommendations for Other Services       Precautions / Restrictions Precautions Precautions: Fall Precaution Comments: watch HR Restrictions Weight Bearing Restrictions: No     Mobility  Bed Mobility Overal bed mobility: Needs Assistance Bed Mobility: Supine to Sit     Supine to sit: Min assist, HOB elevated Sit to supine: Supervision, HOB elevated   General bed mobility comments: min assist with hand held assist to pull up for trunk    Transfers Overall transfer level: Needs assistance Equipment used: Rolling walker (2 wheels) Transfers: Sit to/from Stand Sit to Stand: Min guard           General transfer comment: Min guard assist for safety, from EOB x2. Impulsive initially. + dizziness.    Ambulation/Gait Ambulation/Gait assistance: Min assist, Min guard Gait Distance (Feet): 150 Feet Assistive device: Rolling walker (2 wheels) Gait Pattern/deviations: Step-through pattern, Decreased stride length, Staggering left Gait velocity: decreased Gait velocity interpretation: <1.8 ft/sec, indicate of risk for recurrent falls   General Gait Details: Slow, mildly unsteady gait veering towards left with RW, cues for RW management. HR up to 130s bpm.   Stairs             Wheelchair Mobility    Modified Rankin (Stroke Patients Only) Modified Rankin (Stroke Patients Only) Pre-Morbid Rankin Score: No symptoms Modified Rankin: Moderately severe disability     Balance Overall balance assessment: Needs assistance Sitting-balance support: No upper extremity supported, Feet supported Sitting balance-Leahy Scale: Good     Standing balance support: During functional activity Standing balance-Leahy Scale: Fair  Cognition Arousal/Alertness: Awake/alert Behavior During Therapy: WFL  for tasks assessed/performed Overall Cognitive Status: Impaired/Different from baseline Area of Impairment: Following commands, Safety/judgement, Awareness, Problem solving, Memory                   Current Attention Level: Sustained Memory: Decreased short-term memory Following Commands: Follows one step commands consistently, Follows one step commands with increased time Safety/Judgement: Decreased awareness of safety Awareness: Anticipatory Problem Solving: Slow processing, Difficulty sequencing, Requires verbal cues General Comments: Slightly impulsive to stand today resulting in dizziness, sat down on EOB until symptoms resolved. Improving safety awareness. Oriented to month, year and place today. "it jhas been too long" for LOS.        Exercises      General Comments General comments (skin integrity, edema, etc.): HR up to 130s bpm observed with activity.      Pertinent Vitals/Pain Pain Assessment Pain Assessment: No/denies pain    Home Living                          Prior Function            PT Goals (current goals can now be found in the care plan section) Progress towards PT goals: Progressing toward goals    Frequency    Min 3X/week      PT Plan Current plan remains appropriate    Co-evaluation              AM-PAC PT "6 Clicks" Mobility   Outcome Measure  Help needed turning from your back to your side while in a flat bed without using bedrails?: A Little Help needed moving from lying on your back to sitting on the side of a flat bed without using bedrails?: A Little Help needed moving to and from a bed to a chair (including a wheelchair)?: A Little Help needed standing up from a chair using your arms (e.g., wheelchair or bedside chair)?: A Little Help needed to walk in hospital room?: A Little Help needed climbing 3-5 steps with a railing? : Total 6 Click Score: 16    End of Session Equipment Utilized During Treatment:  Gait belt Activity Tolerance: Patient tolerated treatment well Patient left: in bed;with call bell/phone within reach;with bed alarm set Nurse Communication: Mobility status PT Visit Diagnosis: Unsteadiness on feet (R26.81);Other abnormalities of gait and mobility (R26.89);Muscle weakness (generalized) (M62.81);Difficulty in walking, not elsewhere classified (R26.2);Dizziness and giddiness (R42)     Time: 1610-9604 PT Time Calculation (min) (ACUTE ONLY): 15 min  Charges:  $Gait Training: 8-22 mins                     Jose Li, PT, DPT Acute Rehabilitation Services Secure chat preferred Office [phone]      Jose Li 07/02/2022, 12:55 PM

## 2022-07-02 NOTE — Progress Notes (Signed)
Patient suffered a ground-level fall off the toilet last night.  No loss of consciousness.  No headache.  No new neurologic symptoms.  He is currently at his baseline.  He is awaiting placement.

## 2022-07-02 NOTE — TOC Progression Note (Signed)
Transition of Care [facility]) - Progression Note    Patient Details  Name: Jose Li MRN: [id number] Date of Birth: September 09, 1956  Transition of Care [facility]) CM/SW Contact  Baldemar Lenis, [place] Phone Number: 07/02/2022, 2:40 PM  Clinical Narrative:   CSW received call from [place] with [employer], she came to evaluate patient this afternoon. CSW asked about when [employer] number would be assigned, and she could not say. CSW contacted patient's significant other, Gunnar Fusi, to provide update and answer questions. CSW also updated TOC Director about continued [employer] delay. CSW to follow.    Expected Discharge Plan: Skilled Nursing Facility Barriers to Discharge: Awaiting State Approval ([employer])  Expected Discharge Plan and Services Expected Discharge Plan: Skilled Nursing Facility                                               Social Determinants of Health (SDOH) Interventions    Readmission Risk Interventions     No data to display

## 2022-07-02 NOTE — Progress Notes (Signed)
   07/01/22 2248  What Happened  Was fall witnessed? No  Was patient injured? No  Patient found on floor  Found by Staff-comment (Briana  Hutchenson)  Stated prior activity ambulating-unassisted (Pt attempted ambulation from toilet to bed without alerting for assistance)  Follow Up  MD notified y  Time MD notified [id number]  Family notified No - patient refusal  Additional tests No  Simple treatment Other (comment)  Progress note created (see row info) Yes  Adult Fall Risk Assessment  Risk Factor Category (scoring not indicated) High fall risk per protocol (document High fall risk)  Patient Fall Risk Level High fall risk  Adult Fall Risk Interventions  Required Bundle Interventions *See Row Information* High fall risk - low, moderate, and high requirements implemented  Additional Interventions Use of appropriate toileting equipment (bedpan, BSC, etc.)  Screening for Fall Injury Risk (To be completed on HIGH fall risk patients) - Assessing Need for Floor Mats  Risk For Fall Injury- Criteria for Floor Mats Noncompliant with safety precautions  Will Implement Floor Mats Yes  Vitals  Temp 98 F (36.7 C)  Temp Source Oral  BP 135/74  MAP (mmHg) 87  BP Location Right Arm  BP Method Automatic  Patient Position (if appropriate) Lying  Pulse Rate 77  Pulse Rate Source Monitor  Resp 19  Oxygen Therapy  SpO2 98 %  O2 Device Room Air  Pain Assessment  Pain Scale 0-10  Pain Score 0  Neurological  Neuro (WDL) X  Level of Consciousness Alert  Orientation Level Oriented X4  Cognition Follows commands  Speech Clear  R Pupil Size (mm) 4  R Pupil Shape Round  R Pupil Reaction Brisk  L Pupil Size (mm) 4  L Pupil Shape Round  L Pupil Reaction Brisk  Motor Function/Sensation Assessment Motor response;Grip;Motor strength  Facial Symmetry Symmetrical  R Hand Grip Strong  L Hand Grip Strong  R Foot Dorsiflexion Strong  L Foot Dorsiflexion Strong  R Foot Plantar Flexion Strong  L Foot  Plantar Flexion Strong  RUE Motor Response Purposeful movement  RUE Sensation Full sensation  RUE Motor Strength 5  LUE Motor Response Purposeful movement  LUE Sensation Full sensation  LUE Motor Strength 5  RLE Motor Response Purposeful movement  RLE Sensation Full sensation  RLE Motor Strength 4  LLE Motor Response Purposeful movement  LLE Sensation Full sensation  LLE Motor Strength 4  Neuro Symptoms None  Seizure Activity  Psychomotor Symptoms None  Glasgow Coma Scale  Eye Opening 4  Best Verbal Response (NON-intubated) 5  Best Motor Response 6  Glasgow Coma Scale Score 15  Musculoskeletal  Musculoskeletal (WDL) X  Assistive Device Front wheel walker  Generalized Weakness Yes  Weight Bearing Restrictions No  Integumentary  Integumentary (WDL) X  Skin Color Appropriate for ethnicity  Skin Condition Dry  Skin Integrity Abrasion  Abrasion Location Arm  Abrasion Location Orientation Bilateral  Abrasion Intervention Other (Comment) (OTA)  Ecchymosis Location Sacrum  Ecchymosis Location Orientation Bilateral  Erythema/Redness Location Scrotum  Erythema/Redness Location Orientation Bilateral  Skin Turgor Non-tenting

## 2022-07-03 NOTE — Progress Notes (Signed)
   Providing Compassionate, Quality Care - Together  NEUROSURGERY PROGRESS NOTE   S: No issues overnight.   O: EXAM:  BP 117/61 (BP Location: Left Arm)   Pulse 72   Temp 98.1 F (36.7 C) (Oral)   Resp 16   Ht 6' (1.829 m)   Wt 76 kg   SpO2 97%   BMI 22.72 kg/m   Awake, alert, oriented  PERRL Speech fluent, appropriate  Moves all extremities Sitting up comfortably in bed  ASSESSMENT:  66 y.o. male with   Subdural hematoma  Status post evacuation 06/08/2022  PLAN: -SNF pending -Overall stable    Thank you for allowing me to participate in this patient's care.  Please do not hesitate to call with questions or concerns.   Monia Pouch, DO Neurosurgeon [facility] Cell: [phone]

## 2022-07-04 NOTE — Progress Notes (Signed)
   Providing Compassionate, Quality Care - Together  NEUROSURGERY PROGRESS NOTE   S: No issues overnight. Denies HA  O: EXAM:  BP (!) 113/59 (BP Location: Right Arm)   Pulse 94   Temp 98.6 F (37 C) (Oral)   Resp 16   Ht 6' (1.829 m)   Wt 76 kg   SpO2 97%   BMI 22.72 kg/m   Awake, alert, oriented  PERRL Speech fluent, appropriate  Moves all extremities Sitting up comfortably in bed   ASSESSMENT:  66 y.o. male with    Subdural hematoma   Status post evacuation 06/08/2022   PLAN: -SNF pending, approval pending -Overall stable    Thank you for allowing me to participate in this patient's care.  Please do not hesitate to call with questions or concerns.   Monia Pouch, DO Neurosurgeon [facility] Cell: [phone]

## 2022-07-05 NOTE — Progress Notes (Signed)
No new issues or problems.  Patient without headache.  Neurologic exam is stable.  Awaiting placement.

## 2022-07-05 NOTE — Progress Notes (Signed)
Occupational Therapy Treatment Patient Details Name: Jose Li MRN: [id number] DOB: 1956-06-12 Today's Date: 07/05/2022   History of present illness Pt is 66 year old male found 06/07/22 on ground at home during wellness check  and admitted with subdural hematoma. [id number]:[id number] acute hemispheric subdural hematoma on the left with mass effect. 7/25: Left frontotemporoparietal craniotomy and evacuation of acute posttraumatic subdural hematoma, placement of subdural drain (has been pulled). Pt pulled out Cortrak evening of 8/3.  PHMx: seizure disorder, alcohol disorder.   OT comments  Pt making steady progress towards OT goals this session. Pt continues to present with dizziness with mobility ( mostly upon initial stand) and impaired balance . Session focus on BADL reeducation, functional mobility and increasing overall activity tolerance. Pt currently requires supervision- MIN A for seated ADLS ( pt reports dizziness upon initial stand needing to complete ADLS from sitting) and min guard assist for ADL transfers with RW. Pt would continue to benefit from skilled occupational therapy while admitted and after d/c to address the below listed limitations in order to improve overall functional mobility and facilitate independence with BADL participation. DC plan remains appropriate, will follow acutely per POC.   BP sitting on EOB after reporting dizziness- 108/76 ( 87) HR 107 sitting EOB post ADLS at sink- 135/74( 91) HR 85 HR max 121 bpm     Recommendations for follow up therapy are one component of a multi-disciplinary discharge planning process, led by the attending physician.  Recommendations may be updated based on patient status, additional functional criteria and insurance authorization.    Follow Up Recommendations  Skilled nursing-short term rehab (<3 hours/day)    Assistance Recommended at Discharge Frequent or constant Supervision/Assistance  Patient can return home with the following   Assistance with cooking/housework;Help with stairs or ramp for entrance;Assist for transportation;Direct supervision/assist for financial management;Direct supervision/assist for medications management;A little help with walking and/or transfers;A little help with bathing/dressing/bathroom   Equipment Recommendations  Other (comment) (defer)    Recommendations for Other Services      Precautions / Restrictions Precautions Precautions: Fall Precaution Comments: watch HR Restrictions Weight Bearing Restrictions: No       Mobility Bed Mobility   Bed Mobility: Sit to Supine       Sit to supine: Supervision, HOB elevated        Transfers Overall transfer level: Needs assistance Equipment used: Rolling walker (2 wheels) Transfers: Sit to/from Stand Sit to Stand: Supervision, Min guard           General transfer comment: impulsively sit>stand from recliner with pt reporting dizziness needign to get to EOB quickly, Min guard to stand from EOB with education provided on standing slowing and focusing on deep breath while ascending     Balance Overall balance assessment: Needs assistance Sitting-balance support: No upper extremity supported, Feet supported Sitting balance-Leahy Scale: Good     Standing balance support: During functional activity, Single extremity supported Standing balance-Leahy Scale: Poor Standing balance comment: required at least one UE supported during standing ADLs at sink                           ADL either performed or assessed with clinical judgement   ADL Overall ADL's : Needs assistance/impaired     Grooming: Wash/dry face;Sitting;Brushing hair   Upper Body Bathing: Supervision/ safety;Sitting Upper Body Bathing Details (indicate cue type and reason): at sink     Upper Body Dressing : Minimal assistance;Sitting Upper Body Dressing  Details (indicate cue type and reason): donning gown sitting in chair at sink     Toilet  Transfer: Min guard;Ambulation;Rolling walker (2 wheels) Toilet Transfer Details (indicate cue type and reason): simulated via functional mobility in room         Functional mobility during ADLs: Min guard;Minimal assistance;Rolling walker (2 wheels) General ADL Comments: pt limited by dizzziness this session    Extremity/Trunk Assessment Upper Extremity Assessment Upper Extremity Assessment: Generalized weakness   Lower Extremity Assessment Lower Extremity Assessment: Defer to PT evaluation        Vision Baseline Vision/History: 1 Wears glasses Patient Visual Report: No change from baseline     Perception Perception Perception: Within Functional Limits   Praxis Praxis Praxis: Intact    Cognition Arousal/Alertness: Awake/alert Behavior During Therapy: WFL for tasks assessed/performed Overall Cognitive Status: Impaired/Different from baseline Area of Impairment: Following commands, Safety/judgement, Awareness, Problem solving                       Following Commands: Follows one step commands consistently, Follows multi-step commands with increased time Safety/Judgement: Decreased awareness of safety Awareness: Emergent Problem Solving: Slow processing General Comments: pt Axox4 slow processing but did have awareness to feeling "woozy" appropriately        Exercises      Shoulder Instructions       General Comments BP sitting on EOB after reporting dizziness- 108/76 ( 87) HR 107, sitting EOB post ADLS at sink- 135/74( 91) HR 85, HR max 121 bpm    Pertinent Vitals/ Pain       Pain Assessment Pain Assessment: Faces Faces Pain Scale: Hurts little more Pain Descriptors / Indicators: Headache Pain Intervention(s): Monitored during session  Home Living                                          Prior Functioning/Environment              Frequency  Min 2X/week        Progress Toward Goals  OT Goals(current goals can now be  found in the care plan section)  Progress towards OT goals: Progressing toward goals  Acute Rehab OT Goals Patient Stated Goal: none stated Time For Goal Achievement: 07/13/22 Potential to Achieve Goals: Good  Plan Discharge plan remains appropriate;Frequency remains appropriate    Co-evaluation                 AM-PAC OT "6 Clicks" Daily Activity     Outcome Measure   Help from another person eating meals?: A Little Help from another person taking care of personal grooming?: A Little Help from another person toileting, which includes using toliet, bedpan, or urinal?: A Lot Help from another person bathing (including washing, rinsing, drying)?: A Little Help from another person to put on and taking off regular upper body clothing?: A Little Help from another person to put on and taking off regular lower body clothing?: A Lot 6 Click Score: 16    End of Session Equipment Utilized During Treatment: Gait belt;Rolling walker (2 wheels)  OT Visit Diagnosis: Unsteadiness on feet (R26.81);Other abnormalities of gait and mobility (R26.89);Other symptoms and signs involving cognitive function   Activity Tolerance Patient tolerated treatment well   Patient Left in bed;with call bell/phone within reach;with bed alarm set   Nurse Communication Mobility status;Other (comment) (via secure chat)  Time: [id number] OT Time Calculation (min): 19 min  Charges: OT General Charges $OT Visit: 1 Visit OT Treatments $Self Care/Home Management : 8-22 mins  Harley Alto., COTA/L Acute Rehabilitation Services [phone]   Precious Haws 07/05/2022, 4:18 PM

## 2022-07-05 NOTE — Progress Notes (Signed)
Physical Therapy Treatment Patient Details Name: Jose Li MRN: [id number] DOB: 09/06/1956 Today's Date: 07/05/2022   History of Present Illness Pt is 66 year old male found 06/07/22 on ground at home during wellness check  and admitted with subdural hematoma. PY:[id number] acute hemispheric subdural hematoma on the left with mass effect. 7/25: Left frontotemporoparietal craniotomy and evacuation of acute posttraumatic subdural hematoma, placement of subdural drain (has been pulled). Pt pulled out Cortrak evening of 8/3.  PHMx: seizure disorder, alcohol disorder.    PT Comments    Pt with decreased activity tolerance today reporting "I don't have any zip today." Pt amb 18' with RW today with progressive bilat knee flexion t/o ambulation requiring up to modA for safe ambulation back to chair. Pt did participate in seated LE exercises. Acute PT to continue to follow. Pt remains appropriate for SNF Upon d/c for maximal functional recovery.    Recommendations for follow up therapy are one component of a multi-disciplinary discharge planning process, led by the attending physician.  Recommendations may be updated based on patient status, additional functional criteria and insurance authorization.  Follow Up Recommendations  Skilled nursing-short term rehab (<3 hours/day) Can patient physically be transported by private vehicle: Yes   Assistance Recommended at Discharge Frequent or constant Supervision/Assistance  Patient can return home with the following Assistance with cooking/housework;Direct supervision/assist for medications management;Direct supervision/assist for financial management;Assist for transportation;Help with stairs or ramp for entrance;Two people to help with bathing/dressing/bathroom;A little help with walking and/or transfers   Equipment Recommendations  Other (comment) (defer to next venue)    Recommendations for Other Services Rehab consult     Precautions / Restrictions  Precautions Precautions: Fall Restrictions Weight Bearing Restrictions: No     Mobility  Bed Mobility Overal bed mobility: Needs Assistance Bed Mobility: Supine to Sit     Supine to sit: Min assist, HOB elevated     General bed mobility comments: max directional verbal cues, minA for trunk extension    Transfers Overall transfer level: Needs assistance Equipment used: Rolling walker (2 wheels) Transfers: Sit to/from Stand Sit to Stand: Min assist           General transfer comment: minA to power up, pt with report of some mild dizziness. pt stood to don back gown however pt had to return to sitting due to "I just don't have it in me today"    Ambulation/Gait Ambulation/Gait assistance: Min assist Gait Distance (Feet): 80 Feet Assistive device: Rolling walker (2 wheels) Gait Pattern/deviations: Step-through pattern, Decreased stride length, Shuffle, Narrow base of support (bilat LEs in external rotation) Gait velocity: decreased     General Gait Details: pt with bilat knees flexed with L progressively worsening with onset of fatigue in progressive walking, pt running into objects ont he R, minA for walker management   Stairs             Wheelchair Mobility    Modified Rankin (Stroke Patients Only) Modified Rankin (Stroke Patients Only) Pre-Morbid Rankin Score: No symptoms Modified Rankin: Moderately severe disability     Balance Overall balance assessment: Needs assistance Sitting-balance support: No upper extremity supported, Feet supported Sitting balance-Leahy Scale: Good Sitting balance - Comments: can reach outsie BOS without LOB   Standing balance support: During functional activity Standing balance-Leahy Scale: Poor Standing balance comment: needed to sit s/p standing for 15sec while donning back gown  Cognition Arousal/Alertness: Awake/alert Behavior During Therapy: WFL for tasks  assessed/performed Overall Cognitive Status: Impaired/Different from baseline Area of Impairment: Following commands, Safety/judgement, Awareness, Problem solving                   Current Attention Level: Sustained Memory: Decreased short-term memory Following Commands: Follows one step commands consistently, Follows one step commands with increased time Safety/Judgement: Decreased awareness of safety Awareness: Anticipatory Problem Solving: Slow processing, Difficulty sequencing, Requires verbal cues General Comments: pt reports "I don't have any zip today." pt followed commands consistently        Exercises General Exercises - Lower Extremity Long Arc Quad: Both, Seated, Strengthening, 10 reps (against manual resistance) Heel Slides: AROM, Both, 10 reps, Seated (against manual resistance) Hip ABduction/ADduction: Strengthening, Both, 10 reps, Seated Hip Flexion/Marching: Strengthening, Both, 10 reps, Seated    General Comments General comments (skin integrity, edema, etc.): bed soiled in urine d/t condom catheter coming off, RN tech called to address      Pertinent Vitals/Pain Pain Assessment Pain Assessment: No/denies pain    Home Living                          Prior Function            PT Goals (current goals can now be found in the care plan section) Acute Rehab PT Goals PT Goal Formulation: With patient Time For Goal Achievement: 07/19/22 Potential to Achieve Goals: Good Progress towards PT goals: Progressing toward goals    Frequency    Min 3X/week      PT Plan Current plan remains appropriate    Co-evaluation              AM-PAC PT "6 Clicks" Mobility   Outcome Measure  Help needed turning from your back to your side while in a flat bed without using bedrails?: A Little Help needed moving from lying on your back to sitting on the side of a flat bed without using bedrails?: A Little Help needed moving to and from a bed to a  chair (including a wheelchair)?: A Little Help needed standing up from a chair using your arms (e.g., wheelchair or bedside chair)?: A Little Help needed to walk in hospital room?: A Lot Help needed climbing 3-5 steps with a railing? : Total 6 Click Score: 15    End of Session Equipment Utilized During Treatment: Gait belt Activity Tolerance: Patient tolerated treatment well Patient left: with call bell/phone within reach;in chair;with chair alarm set Nurse Communication: Mobility status PT Visit Diagnosis: Unsteadiness on feet (R26.81);Other abnormalities of gait and mobility (R26.89);Muscle weakness (generalized) (M62.81);Difficulty in walking, not elsewhere classified (R26.2);Dizziness and giddiness (R42)     Time: 0932-3557 PT Time Calculation (min) (ACUTE ONLY): 17 min  Charges:  $Gait Training: 8-22 mins                     Lewis Shock, PT, DPT Acute Rehabilitation Services Secure chat preferred Office #: [phone]    Iona Hansen 07/05/2022, 1:53 PM

## 2022-07-05 NOTE — Progress Notes (Signed)
Patient's PASSR screen is still running at this time.  Edwin Dada, MSW, LCSW Transitions of Care  Clinical Social Worker II [phone]

## 2022-07-06 DIAGNOSIS — F32A Depression, unspecified: Secondary | ICD-10-CM | POA: Diagnosis not present

## 2022-07-06 DIAGNOSIS — E039 Hypothyroidism, unspecified: Secondary | ICD-10-CM | POA: Diagnosis not present

## 2022-07-06 DIAGNOSIS — G629 Polyneuropathy, unspecified: Secondary | ICD-10-CM | POA: Diagnosis not present

## 2022-07-06 DIAGNOSIS — S82302D Unspecified fracture of lower end of left tibia, subsequent encounter for closed fracture with routine healing: Secondary | ICD-10-CM | POA: Diagnosis not present

## 2022-07-06 DIAGNOSIS — W19XXXA Unspecified fall, initial encounter: Secondary | ICD-10-CM | POA: Diagnosis not present

## 2022-07-06 DIAGNOSIS — Z9889 Other specified postprocedural states: Secondary | ICD-10-CM | POA: Diagnosis not present

## 2022-07-06 DIAGNOSIS — R262 Difficulty in walking, not elsewhere classified: Secondary | ICD-10-CM | POA: Diagnosis not present

## 2022-07-06 DIAGNOSIS — S92151D Displaced avulsion fracture (chip fracture) of right talus, subsequent encounter for fracture with routine healing: Secondary | ICD-10-CM | POA: Diagnosis not present

## 2022-07-06 DIAGNOSIS — G4700 Insomnia, unspecified: Secondary | ICD-10-CM | POA: Diagnosis not present

## 2022-07-06 DIAGNOSIS — E785 Hyperlipidemia, unspecified: Secondary | ICD-10-CM | POA: Diagnosis not present

## 2022-07-06 DIAGNOSIS — R296 Repeated falls: Secondary | ICD-10-CM | POA: Diagnosis not present

## 2022-07-06 DIAGNOSIS — I10 Essential (primary) hypertension: Secondary | ICD-10-CM | POA: Diagnosis not present

## 2022-07-06 DIAGNOSIS — S065XAA Traumatic subdural hemorrhage with loss of consciousness status unknown, initial encounter: Secondary | ICD-10-CM | POA: Diagnosis not present

## 2022-07-06 DIAGNOSIS — S065X0D Traumatic subdural hemorrhage without loss of consciousness, subsequent encounter: Secondary | ICD-10-CM | POA: Diagnosis not present

## 2022-07-06 DIAGNOSIS — Z48811 Encounter for surgical aftercare following surgery on the nervous system: Secondary | ICD-10-CM | POA: Diagnosis not present

## 2022-07-06 DIAGNOSIS — Z720 Tobacco use: Secondary | ICD-10-CM | POA: Diagnosis not present

## 2022-07-06 DIAGNOSIS — F17200 Nicotine dependence, unspecified, uncomplicated: Secondary | ICD-10-CM | POA: Diagnosis not present

## 2022-07-06 DIAGNOSIS — R279 Unspecified lack of coordination: Secondary | ICD-10-CM | POA: Diagnosis not present

## 2022-07-06 DIAGNOSIS — M6281 Muscle weakness (generalized): Secondary | ICD-10-CM | POA: Diagnosis not present

## 2022-07-06 DIAGNOSIS — R339 Retention of urine, unspecified: Secondary | ICD-10-CM | POA: Diagnosis not present

## 2022-07-06 DIAGNOSIS — E440 Moderate protein-calorie malnutrition: Secondary | ICD-10-CM | POA: Diagnosis not present

## 2022-07-06 DIAGNOSIS — R569 Unspecified convulsions: Secondary | ICD-10-CM | POA: Diagnosis not present

## 2022-07-06 DIAGNOSIS — R1310 Dysphagia, unspecified: Secondary | ICD-10-CM | POA: Diagnosis not present

## 2022-07-06 DIAGNOSIS — R2689 Other abnormalities of gait and mobility: Secondary | ICD-10-CM | POA: Diagnosis not present

## 2022-07-06 NOTE — Discharge Summary (Signed)
Physician Discharge Summary     Providing Compassionate, Quality Care - Together   Patient ID: Jose HoopsGary D Yeargan MRN: [id number] DOB/AGE: 07-05-1956 66 y.o.  Admit date: 06/07/2022 Discharge date: 07/06/2022  Admission Diagnoses: Subdural hematoma  Discharge Diagnoses:  Principal Problem:   Status post craniotomy Active Problems:   SDH (subdural hematoma) (HCC)   Malnutrition of moderate degree   Pressure injury of skin   Discharged Condition: good  Hospital Course: Patient underwent a  craniotomy for evacuation of left-sided subdural hematoma by Dr. Jordan LikesPool on 725/2023. His postoperative course was complicated by delirium that has resolved. He has worked with both physical and occupational therapies who feel the patient is ready for discharge to a skilled nursing facility. He is ambulating independently and without difficulty. He is tolerating a normal diet. He is not having any bowel or bladder dysfunction. His pain is well-controlled with oral pain medication. He is ready for discharge to the SNF.   Consults: rehabilitation medicine  Significant Diagnostic Studies: radiology: DG Abd Portable 1V  Result Date: 06/18/2022 CLINICAL DATA:  Feeding tube placement EXAM: PORTABLE ABDOMEN - 1 VIEW COMPARISON:  None Available. FINDINGS: Non weighted enteric feeding tube is positioned with tip below the diaphragm, in the vicinity of the pylorus or duodenal bulb. Nonobstructive pattern of included bowel gas. No free air. IMPRESSION: Non weighted enteric feeding tube is positioned with tip below the diaphragm, in the vicinity of the pylorus or duodenal bulb. Consider advancement if post pyloric positioning is desired. Electronically Signed   By: Jearld LeschAlex D Bibbey M.D.   On: 06/18/2022 12:22   CT HEAD WO CONTRAST (5MM)  Result Date: 06/12/2022 CLINICAL DATA:  Follow-up examination for subdural hematoma. EXAM: CT HEAD WITHOUT CONTRAST TECHNIQUE: Contiguous axial images were obtained from the base of the  skull through the vertex without intravenous contrast. RADIATION DOSE REDUCTION: This exam was performed according to the departmental dose-optimization program which includes automated exposure control, adjustment of the mA and/or kV according to patient size and/or use of iterative reconstruction technique. COMPARISON:  Prior CT from 06/10/2022. FINDINGS: Brain: Postoperative changes from recent left craniotomy for subdural evacuation again seen. A subdural drain has been removed in the interim. Persistent postoperative pneumocephalus, most pronounced overlying the anterior frontal convexities, mildly increased. Residual heterogeneous left subdural collection containing blood, pneumocephalus, and probably hemostatic material again seen overlying the left cerebral convexity. This continues to measure up to 14 mm in thickness, similar to previous. Associated parafalcine component however is decreased from prior. Degree of hyperdense blood products within the collection is similar to perhaps mildly decreased, with no new bleeding seen. Persistent mass effect on the subjacent left cerebral hemisphere with 3 mm left-to-right shift. Interval decrease in hyperdense blood products about the previously seen smaller right subdural hematoma, with only small volume scattered acute blood products now seen. Majority of this collection now has an appearance of a hypodense hygroma and measures up to 8 mm in maximal thickness (series 5, image 38), increased from prior. Hemorrhagic contusion involving the left superior frontal gyrus continues to evolve, slightly decreased in size now measuring up to 1.5 cm. Mild localized edema without significant regional mass effect. Few additional scattered foci of intra-axial blood continues to decrease, most notable at the posterior right temporal occipital region and right cerebellum. Small volume intraventricular hemorrhage layering within the occipital horns again seen, little interval  change from prior. Relatively stable ventricular size and morphology without hydrocephalus. No new intracranial hemorrhage. No acute cortically based infarct. Underlying  chronic microvascular ischemic disease with chronic right basal ganglia lacunar infarct noted. Vascular: No abnormal hyperdense vessel. Scattered vascular calcifications noted within the carotid siphons. Skull: Prior left frontotemporal craniotomy. Previously noted depression of the craniotomy bone flap is no longer seen. Comminution along the anterior margin of the craniotomy site again noted. Overlying scalp soft tissue swelling with emphysema and skin staples. Sinuses/Orbits: Globes and orbital soft tissues demonstrate no acute finding. Mild mucoperiosteal thickening noted about the ethmoidal air cells and maxillary sinuses. Mastoid air cells remain largely clear. Other: None. IMPRESSION: 1. Postoperative changes from recent left frontotemporal craniotomy for subdural evacuation, with interval removal of a subdural drain. Residual heterogeneous left subdural collection measures up to 14 mm in thickness, similar to previous. Persistent mass effect on the subjacent left cerebral hemisphere with 3 mm left-to-right shift. 2. Persistent and mildly increased pneumocephalus from prior, most pronounced over the anterior right greater than left frontal convexities with mass effect on the subjacent brain parenchyma. 3. Interval decrease in hyperdense blood products about the previously seen smaller right subdural hematoma. Majority of this collection now has an appearance of a hypodense hygroma and measures up to 8 mm in maximal thickness. 4. Continued interval decrease in size of hemorrhagic contusion involving the left superior frontal gyrus. Additional scattered intra-axial hemorrhage also continues to decrease. Trace IVH not significantly changed. 5. No other new acute intracranial abnormality. Electronically Signed   By: Rise Mu M.D.    On: 06/12/2022 04:20   DG Abd Portable 1V  Result Date: 06/10/2022 CLINICAL DATA:  Feeding tube placement EXAM: PORTABLE ABDOMEN - 1 VIEW COMPARISON:  Radiographs 02/05/2016 FINDINGS: Feeding tube tip projects over the expected location of the proximal duodenum. The bowel gas pattern is normal. No radio-opaque calculi or other significant radiographic abnormality are seen. IMPRESSION: Feeding tube tip likely in the proximal duodenum. Electronically Signed   By: Minerva Fester M.D.   On: 06/10/2022 10:57   CT HEAD WO CONTRAST ( )  Result Date: 06/10/2022 CLINICAL DATA:  66 year old male i postoperative day 1 status post left side craniotomy and evacuation of subdural hematoma. EXAM: CT HEAD WITHOUT CONTRAST TECHNIQUE: Contiguous axial images were obtained from the base of the skull through the vertex without intravenous contrast. RADIATION DOSE REDUCTION: This exam was performed according to the departmental dose-optimization program which includes automated exposure control, adjustment of the mA and/or kV according to patient size and/or use of iterative reconstruction technique. COMPARISON:  Postoperative head CT 06/08/2022. FINDINGS: Brain: Moderate increased pneumocephalus from the postoperative CT yesterday (right frontal convexity series 3, image 21) now with mild mass effect along the anterior right frontal lobe. The superimposed small right side subdural hematoma has not significantly changed, up to 5 mm now on coronal image 51 (4 mm yesterday). Increased para falcine pneumocephalus also. Left side heterogeneous subdural space with residual blood products, gas, and probable Gelfoam in addition to subdural catheter is stable since yesterday, up to about 14 mm in thickness on coronal image 29. Decreased volume of left para falcine subdural blood. Rightward midline shift is mild, now 4-5 mm. Mild mass effect on the left lateral ventricle is unchanged. No ventriculomegaly. There is trace IVH again  visible in both occipital horns. Anterior left superior frontal gyrus hemorrhagic contusion is stable on series 3, image 24. Other small foci of intra-axial blood have faded (posterior right hemisphere series 3, image 19) and the central right cerebellar hemorrhagic focus also appears slightly regressed (coronal image 55). No new intracranial hemorrhage  identified. No cortically based acute infarct identified. Basilar cisterns remain stable. Vascular: Mild Calcified atherosclerosis at the skull base. No suspicious intracranial vascular hyperdensity. Skull: Unchanged appearance of left frontotemporal craniotomy, slight depression and comminution along the anterior margin as before. Sinuses/Orbits: Mild maxillary mucosal thickening but other Visualized paranasal sinuses and mastoids are stable and well aerated. Other: Postoperative changes to the scalp with skin staples in place. Intracranial drain remains in place. Orbits appear negative. IMPRESSION: 1. Increased Pneumocephalus since the postoperative CT yesterday, especially along the anterior right frontal lobe with mild parenchymal mass effect. 2. But multifocal intracranial hemorrhage has not significantly changed: - left > right Subdural Hematomas (left side drain in place), - anterior left superior frontal gyrus blood/hemorrhagic contusion, while other scattered small areas of intra-axial blood appear regressed. - trace IVH. 3. Stable intracranial mass effect, mild rightward midline shift of 4-5 mm. No ventriculomegaly. Electronically Signed   By: Odessa Fleming M.D.   On: 06/10/2022 06:29   CT HEAD WO CONTRAST  Addendum Date: 06/08/2022   ADDENDUM REPORT: 06/08/2022 10:04 ADDENDUM: Study discussed by telephone with Dr. Conchita Paris on 06/08/2022 at 0957 hours. Electronically Signed   By: Odessa Fleming M.D.   On: 06/08/2022 10:04   Result Date: 06/08/2022 CLINICAL DATA:  66 year old male postoperative day zero left side craniotomy and evacuation of posttraumatic subdural  hematoma. EXAM: CT HEAD WITHOUT CONTRAST TECHNIQUE: Contiguous axial images were obtained from the base of the skull through the vertex without intravenous contrast. RADIATION DOSE REDUCTION: This exam was performed according to the departmental dose-optimization program which includes automated exposure control, adjustment of the mA and/or kV according to patient size and/or use of iterative reconstruction technique. COMPARISON:  Head CT 0011 hours today. FINDINGS: Brain: Left-side intracranial drain transmitted through the posterior craniotomy site, with overlying Gelfoam type material and residual mixed density left side subdural hematoma up to 8 mm in thickness. Additional pneumocephalus superimposed, mild to moderate. Small volume of para falcine subdural blood does not appear significantly changed. But a previously trace right side subdural hematoma has increased up to 4 mm now, mixed density coronal image 51. And additionally, there are multiple small foci of new intra-axial blood. The largest is in the anterior left frontal lobe up to 2.3 cm resembling hemorrhagic contusion on series 3, image 23. But note multiple smaller peripheral hemorrhagic foci, such as in the posterior left temporal lobe (series 7, image 14), right parietal lobe (image 19) and a larger nearly 2 cm curvilinear area in the right cerebellum best seen on coronal image 53. The trace intraventricular hemorrhage seen earlier does not persist. No brainstem hemorrhage identified. There is ongoing intracranial mass effect including on the left lateral ventricle. Rightward midline shift has decreased to 6 mm on series 7, image 19. No acute ventriculomegaly suspected. Basilar cistern patency has improved. No cortically based acute infarct identified. Vascular: Calcified atherosclerosis at the skull base. Skull: Left frontotemporal craniotomy with mild comminution along the anterior skull flap (series 4, image 41) and up to 3 mm of bone flap  depression anteriorly (series 4, image 46). Elsewhere the skull appears stable and intact. Sinuses/Orbits: Visualized paranasal sinuses and mastoids are stable and well aerated. Other: Postoperative changes to the left scalp with skin staples in place. Orbits soft tissues remain negative. IMPRESSION: 1. Interval left frontotemporal craniotomy. Note mild comminution and mild depression along the anterior skull flap. 2. Regressed left side subdural hematoma with drain in place, residual up to 8 mm. 3. But increased  smaller right side subdural hematoma (now up to 4 mm) and multiple small new supra- and infratentorial foci of intra-axial hemorrhage, some resembling hemorrhagic contusions (left anterior frontal lobe). Small volume SAH is possible. No convincing IVH now. 4. Still, there is decreased intracranial mass effect. Rightward midline shift now 6 mm. Electronically Signed: By: Odessa Fleming M.D. On: 06/08/2022 09:29   CT Head Wo Contrast  Result Date: 06/08/2022 CLINICAL DATA:  Initial evaluation for acute trauma. EXAM: CT HEAD WITHOUT CONTRAST CT CERVICAL SPINE WITHOUT CONTRAST TECHNIQUE: Multidetector CT imaging of the head and cervical spine was performed following the standard protocol without intravenous contrast. Multiplanar CT image reconstructions of the cervical spine were also generated. RADIATION DOSE REDUCTION: This exam was performed according to the departmental dose-optimization program which includes automated exposure control, adjustment of the mA and/or kV according to patient size and/or use of iterative reconstruction technique. COMPARISON:  Prior study from 02/05/2016. FINDINGS: CT HEAD FINDINGS Brain: Age-related cerebral atrophy. Large holo hemispheric subdural hemorrhage overlying the left cerebral convexity is seen. Hemorrhage measures up to 12 mm in thickness. Associated parafalcine component measuring up to 4 mm noted. Associated mass effect on the subjacent left cerebral hemisphere with  up to 1 cm of left-to-right shift. Partial effacement of the left lateral ventricle. No hydrocephalus or trapping. Trace hemorrhage noted layering within the occipital horn of the right lateral ventricle, which could be related to redistribution. No acute large vessel territory infarct.  No visible mass lesion. Vascular: No hyperdense vessel. Scattered vascular calcifications noted within the carotid siphons. Skull: Soft tissue contusion noted involving the left periorbital region. Calvarium intact without fracture. Sinuses/Orbits: Globes and orbital soft tissues demonstrate no acute finding. Scattered mucosal thickening noted about the ethmoidal air cells and maxillary sinuses. No significant mastoid effusion. Other: None. CT CERVICAL SPINE FINDINGS Alignment: Straightening of the normal cervical lordosis. No listhesis or malalignment. Skull base and vertebrae: Skull base intact. Normal C1-2 articulations are preserved in the dens is intact. Vertebral body heights maintained. No acute fracture. Soft tissues and spinal canal: Soft tissues of the neck demonstrate no acute finding. No abnormal prevertebral edema. Spinal canal within normal limits. Disc levels: C5 through C7 vertebral bodies are largely ankylosed. Prior left posterior decompression at C5-6 noted. Upper chest: Visualized upper chest demonstrates no acute finding. 4 mm nodule noted at the left lung apex, indeterminate. Other: None. IMPRESSION: CT BRAIN: 1. Acute holohemispheric subdural hemorrhage overlying the left cerebral convexity with associated left parafalcine component. Associated mass effect on the subjacent left cerebral hemisphere with up to 1 cm of left-to-right shift. No hydrocephalus or trapping. 2. Trace hemorrhage layering within the occipital horn of the right lateral ventricle, which could be related to redistribution. 3. Soft tissue contusion involving the left periorbital region. No calvarial fracture. CT CERVICAL SPINE: No acute  traumatic injury within the cervical spine. Critical Value/emergent results were called by telephone at the time of interpretation on 06/08/2022 at 12:43 am to provider Sharilyn Sites, who verbally acknowledged these results. Electronically Signed   By: Rise Mu M.D.   On: 06/08/2022 00:48   CT Cervical Spine Wo Contrast  Result Date: 06/08/2022 CLINICAL DATA:  Initial evaluation for acute trauma. EXAM: CT HEAD WITHOUT CONTRAST CT CERVICAL SPINE WITHOUT CONTRAST TECHNIQUE: Multidetector CT imaging of the head and cervical spine was performed following the standard protocol without intravenous contrast. Multiplanar CT image reconstructions of the cervical spine were also generated. RADIATION DOSE REDUCTION: This exam was performed according to  the departmental dose-optimization program which includes automated exposure control, adjustment of the mA and/or kV according to patient size and/or use of iterative reconstruction technique. COMPARISON:  Prior study from 02/05/2016. FINDINGS: CT HEAD FINDINGS Brain: Age-related cerebral atrophy. Large holo hemispheric subdural hemorrhage overlying the left cerebral convexity is seen. Hemorrhage measures up to 12 mm in thickness. Associated parafalcine component measuring up to 4 mm noted. Associated mass effect on the subjacent left cerebral hemisphere with up to 1 cm of left-to-right shift. Partial effacement of the left lateral ventricle. No hydrocephalus or trapping. Trace hemorrhage noted layering within the occipital horn of the right lateral ventricle, which could be related to redistribution. No acute large vessel territory infarct.  No visible mass lesion. Vascular: No hyperdense vessel. Scattered vascular calcifications noted within the carotid siphons. Skull: Soft tissue contusion noted involving the left periorbital region. Calvarium intact without fracture. Sinuses/Orbits: Globes and orbital soft tissues demonstrate no acute finding. Scattered mucosal  thickening noted about the ethmoidal air cells and maxillary sinuses. No significant mastoid effusion. Other: None. CT CERVICAL SPINE FINDINGS Alignment: Straightening of the normal cervical lordosis. No listhesis or malalignment. Skull base and vertebrae: Skull base intact. Normal C1-2 articulations are preserved in the dens is intact. Vertebral body heights maintained. No acute fracture. Soft tissues and spinal canal: Soft tissues of the neck demonstrate no acute finding. No abnormal prevertebral edema. Spinal canal within normal limits. Disc levels: C5 through C7 vertebral bodies are largely ankylosed. Prior left posterior decompression at C5-6 noted. Upper chest: Visualized upper chest demonstrates no acute finding. 4 mm nodule noted at the left lung apex, indeterminate. Other: None. IMPRESSION: CT BRAIN: 1. Acute holohemispheric subdural hemorrhage overlying the left cerebral convexity with associated left parafalcine component. Associated mass effect on the subjacent left cerebral hemisphere with up to 1 cm of left-to-right shift. No hydrocephalus or trapping. 2. Trace hemorrhage layering within the occipital horn of the right lateral ventricle, which could be related to redistribution. 3. Soft tissue contusion involving the left periorbital region. No calvarial fracture. CT CERVICAL SPINE: No acute traumatic injury within the cervical spine. Critical Value/emergent results were called by telephone at the time of interpretation on 06/08/2022 at 12:43 am to provider Sharilyn Sites, who verbally acknowledged these results. Electronically Signed   By: Rise Mu M.D.   On: 06/08/2022 00:48   DG Shoulder Left  Result Date: 06/07/2022 CLINICAL DATA:  Status post fall. EXAM: LEFT SHOULDER - 2+ VIEW COMPARISON:  None Available. FINDINGS: There is no evidence of an acute fracture or dislocation. A chronic sixth left rib fracture is seen. There is no evidence of arthropathy or other focal bone abnormality.  Soft tissues are unremarkable. IMPRESSION: No acute osseous abnormality. Electronically Signed   By: Aram Candela M.D.   On: 06/07/2022 22:05   DG Chest Port 1 View  Result Date: 06/07/2022 CLINICAL DATA:  Questionable sepsis.  Headache. EXAM: PORTABLE CHEST 1 VIEW COMPARISON:  02/05/2016 FINDINGS: Shallow inspiration. Mild cardiac enlargement. No vascular congestion, edema, or consolidation. No pleural effusions. No pneumothorax. Mediastinal contours appear intact. Old bilateral rib fractures. IMPRESSION: Cardiac enlargement.  No evidence of active pulmonary disease. Electronically Signed   By: Burman Nieves M.D.   On: 06/07/2022 21:01     Treatments: surgery: Left frontotemporoparietal craniotomy and evacuation of acute posttraumatic subdural hematoma, placement of subdural drain  Discharge Exam: Blood pressure (!) 115/58, pulse 73, temperature 98.3 F (36.8 C), resp. rate 18, height 6' (1.829 m), weight 74.8 kg,  SpO2 99 %.  Patient is alert and pleasant Mildly confused Speech clear MAE, strength and sensation intact Incision is clean, dr, and intact; healing well  Disposition: Discharge disposition: 03-Skilled Nursing Facility       Discharge Instructions     Call MD for:  difficulty breathing, headache or visual disturbances   Complete by: As directed    Call MD for:  persistant nausea and vomiting   Complete by: As directed    Call MD for:  redness, tenderness, or signs of infection (pain, swelling, redness, odor or green/yellow discharge around incision site)   Complete by: As directed    Call MD for:  severe uncontrolled pain   Complete by: As directed    Call MD for:  temperature >100.4   Complete by: As directed    Diet - low sodium heart healthy   Complete by: As directed    Increase activity slowly   Complete by: As directed    No wound care   Complete by: As directed       Allergies as of 07/06/2022       Reactions   Duloxetine Other (See Comments)    Caused hyponatremia   Erythromycin Other (See Comments)   Unknown reaction. The patient can not remember   Hctz [hydrochlorothiazide]    "Makes him faint" per patient Dr. Tenny Craw ([place]) took him off   Propranolol    "Makes him faint" per patient Dr. Tenny Craw ([place]) took him off         Medication List     STOP taking these medications    acetaminophen 650 MG CR tablet Commonly known as: Tylenol 8 Hour Replaced by: acetaminophen 325 MG tablet       TAKE these medications    acetaminophen 325 MG tablet Commonly known as: TYLENOL Take 2 tablets (650 mg total) by mouth every 4 (four) hours as needed for mild pain (temp > 100.5). Replaces: acetaminophen 650 MG CR tablet   aspirin EC 81 MG tablet Take 81 mg by mouth daily.   atorvastatin 20 MG tablet Commonly known as: LIPITOR Take 1 tablet by mouth once daily   benztropine 1 MG tablet Commonly known as: COGENTIN Take 1 mg by mouth at bedtime.   busPIRone 5 MG tablet Commonly known as: BUSPAR Take 5 mg by mouth 2 (two) times daily.   citalopram 20 MG tablet Commonly known as: CELEXA Take 20 mg by mouth daily.   cloNIDine 0.1 MG tablet Commonly known as: CATAPRES Take 1 tablet (0.1 mg total) by mouth 3 (three) times daily.   diclofenac Sodium 1 % Gel Commonly known as: Voltaren Apply 4 g topically 4 (four) times daily. What changed:  when to take this reasons to take this   doxazosin 1 MG tablet Commonly known as: CARDURA Take 1 tablet (1 mg total) by mouth daily.   EQ Allergy Relief (Cetirizine) 10 MG tablet Generic drug: cetirizine Take 1 tablet by mouth once daily What changed: how much to take   feeding supplement Liqd Take 237 mLs by mouth 3 (three) times daily between meals.   levETIRAcetam 750 MG tablet Commonly known as: KEPPRA Take 2 tablets (1,500 mg total) by mouth 2 (two) times daily.   midodrine 5 MG tablet Commonly known as: PROAMATINE TAKE 2 TABLETS BY MOUTH ONCE DAILY AT  6  AM,   THEN  1  TABLET  AT  10  AM  AND  1  TABLET  AT  2  PM What changed:  how much to take how to take this when to take this additional instructions   multivitamin tablet Take 1 tablet by mouth daily.   nicotine 7 mg/24hr patch Commonly known as: NICODERM CQ - dosed in mg/24 hr Place 1 patch (7 mg total) onto the skin daily.   ondansetron 8 MG tablet Commonly known as: Zofran Take 1 tablet (8 mg total) by mouth every 8 (eight) hours as needed for nausea or vomiting.   potassium chloride SA 20 MEQ tablet Commonly known as: KLOR-CON M Take 20 mEq by mouth daily.   pregabalin 75 MG capsule Commonly known as: LYRICA Take 1 capsule (75 mg total) by mouth 2 (two) times daily.   QUEtiapine 50 MG tablet Commonly known as: SEROQUEL Take 1 tablet (50 mg total) by mouth 2 (two) times daily.   risperiDONE 2 MG tablet Commonly known as: RISPERDAL Take 2 mg by mouth at bedtime.   tamsulosin 0.4 MG Caps capsule Commonly known as: FLOMAX Take 0.4 mg by mouth daily after supper.   thiamine 250 MG tablet Take 250 mg by mouth daily.   traZODone 150 MG tablet Commonly known as: DESYREL Take 150 mg by mouth at bedtime.        Follow-up Information     Julio Sicks, MD. Schedule an appointment as soon as possible for a visit in 4 week(s).   Specialty: Neurosurgery Contact information: [address] [place] [address] [phone]                 Signed: Val Eagle, DNP, AGNP-C Nurse Practitioner  [facility] [address], [place], [place] [address] P: [phone]    F: [phone]  07/06/2022, 2:42 PM

## 2022-07-06 NOTE — Progress Notes (Signed)
Significant other is transferring patient

## 2022-07-06 NOTE — Progress Notes (Signed)
Brief Nutrition Note  Reviewed chart and pt consuming 75-100% of meals over the last week. Accepting 1-3 ensures each day. Weight stable. Pt is medically stable and awaiting placement to be discharged. No further nutrition interventions planned at this time. RD will sign off. If new needs or nutrition concerns arise, please place a new nutrition consult.  Greig Castilla, RD, LDN Clinical Dietitian RD pager # available in [facility]  After hours/weekend pager # available in [facility]

## 2022-07-06 NOTE — TOC Transition Note (Signed)
Transition of Care [facility]) - CM/SW Discharge Note   Patient Details  Name: YAQUB ARNEY MRN: [id number] Date of Birth: [birth year]  Transition of [facility]) CM/SW Contact:  Baldemar Lenis, LCSW Phone Number: 07/06/2022, 3:51 PM   Clinical Narrative:   CSW noting that patient has received PASRR. CSW asked for updated insurance authorization and authorization was obtained. Bed available at [facility] today. CSW updated MD, sent discharge information over to [facility]. Significant other at bedside to provide transportation.   Nurse to call report to [phone], Room 109.    Final next level of care: Skilled Nursing Facility Barriers to Discharge: Barriers Resolved   Patient Goals and CMS Choice   CMS Medicare.gov Compare Post Acute Care list provided to:: Patient    Discharge Placement              Patient chooses bed at:  [facility]) Patient to be transferred to facility by: Family Name of family member notified: Self, Gunnar Fusi Patient and family notified of of transfer: 07/06/22  Discharge Plan and Services                                     Social Determinants of Health (SDOH) Interventions     Readmission Risk Interventions     No data to display

## 2022-07-06 NOTE — Plan of Care (Signed)
  Problem: Education: Goal: Knowledge of the prescribed therapeutic regimen will improve Outcome: Completed/Met   Problem: Clinical Measurements: Goal: Usual level of consciousness will be regained or maintained. Outcome: Completed/Met Goal: Neurologic status will improve Outcome: Completed/Met Goal: Ability to maintain intracranial pressure will improve Outcome: Completed/Met   Problem: Skin Integrity: Goal: Demonstration of wound healing without infection will improve Outcome: Completed/Met   Problem: Education: Goal: Knowledge of General Education information will improve Description: Including pain rating scale, medication(s)/side effects and non-pharmacologic comfort measures Outcome: Completed/Met   Problem: Health Behavior/Discharge Planning: Goal: Ability to manage health-related needs will improve Outcome: Completed/Met   Problem: Clinical Measurements: Goal: Ability to maintain clinical measurements within normal limits will improve Outcome: Completed/Met Goal: Will remain free from infection Outcome: Completed/Met Goal: Diagnostic test results will improve Outcome: Completed/Met Goal: Respiratory complications will improve Outcome: Completed/Met Goal: Cardiovascular complication will be avoided Outcome: Completed/Met   Problem: Activity: Goal: Risk for activity intolerance will decrease Outcome: Completed/Met   Problem: Nutrition: Goal: Adequate nutrition will be maintained Outcome: Completed/Met   Problem: Coping: Goal: Level of anxiety will decrease Outcome: Completed/Met   Problem: Elimination: Goal: Will not experience complications related to bowel motility Outcome: Completed/Met Goal: Will not experience complications related to urinary retention Outcome: Completed/Met   Problem: Pain Managment: Goal: General experience of comfort will improve Outcome: Completed/Met   Problem: Safety: Goal: Ability to remain free from injury will  improve Outcome: Completed/Met   Problem: Skin Integrity: Goal: Risk for impaired skin integrity will decrease Outcome: Completed/Met   Problem: Education: Goal: Ability to describe self-care measures that may prevent or decrease complications (Diabetes Survival Skills Education) will improve Outcome: Completed/Met Goal: Individualized Educational Video(s) Outcome: Completed/Met   Problem: Coping: Goal: Ability to adjust to condition or change in health will improve Outcome: Completed/Met   Problem: Fluid Volume: Goal: Ability to maintain a balanced intake and output will improve Outcome: Completed/Met   Problem: Health Behavior/Discharge Planning: Goal: Ability to identify and utilize available resources and services will improve Outcome: Completed/Met Goal: Ability to manage health-related needs will improve Outcome: Completed/Met   Problem: Metabolic: Goal: Ability to maintain appropriate glucose levels will improve Outcome: Completed/Met   Problem: Nutritional: Goal: Maintenance of adequate nutrition will improve Outcome: Completed/Met Goal: Progress toward achieving an optimal weight will improve Outcome: Completed/Met   Problem: Skin Integrity: Goal: Risk for impaired skin integrity will decrease Outcome: Completed/Met   Problem: Tissue Perfusion: Goal: Adequacy of tissue perfusion will improve Outcome: Completed/Met

## 2022-07-06 NOTE — Progress Notes (Signed)
No new issues or problems.  Patient with stable neurologic exam.  Wound well-healed.  Awaiting placement.

## 2022-07-07 DIAGNOSIS — R569 Unspecified convulsions: Secondary | ICD-10-CM | POA: Diagnosis not present

## 2022-07-07 DIAGNOSIS — G4700 Insomnia, unspecified: Secondary | ICD-10-CM | POA: Diagnosis not present

## 2022-07-07 DIAGNOSIS — R339 Retention of urine, unspecified: Secondary | ICD-10-CM | POA: Diagnosis not present

## 2022-07-07 DIAGNOSIS — Z720 Tobacco use: Secondary | ICD-10-CM | POA: Diagnosis not present

## 2022-07-07 DIAGNOSIS — E785 Hyperlipidemia, unspecified: Secondary | ICD-10-CM | POA: Diagnosis not present

## 2022-07-07 DIAGNOSIS — F32A Depression, unspecified: Secondary | ICD-10-CM | POA: Diagnosis not present

## 2022-07-08 DIAGNOSIS — W19XXXA Unspecified fall, initial encounter: Secondary | ICD-10-CM | POA: Diagnosis not present

## 2022-07-08 DIAGNOSIS — R569 Unspecified convulsions: Secondary | ICD-10-CM | POA: Diagnosis not present

## 2022-07-08 DIAGNOSIS — E785 Hyperlipidemia, unspecified: Secondary | ICD-10-CM | POA: Diagnosis not present

## 2022-07-08 DIAGNOSIS — F32A Depression, unspecified: Secondary | ICD-10-CM | POA: Diagnosis not present

## 2022-07-08 DIAGNOSIS — S065XAA Traumatic subdural hemorrhage with loss of consciousness status unknown, initial encounter: Secondary | ICD-10-CM | POA: Diagnosis not present

## 2022-07-08 DIAGNOSIS — Z9889 Other specified postprocedural states: Secondary | ICD-10-CM | POA: Diagnosis not present

## 2022-07-13 DIAGNOSIS — G629 Polyneuropathy, unspecified: Secondary | ICD-10-CM | POA: Diagnosis not present

## 2022-07-19 DIAGNOSIS — G629 Polyneuropathy, unspecified: Secondary | ICD-10-CM | POA: Diagnosis not present

## 2022-07-19 DIAGNOSIS — R569 Unspecified convulsions: Secondary | ICD-10-CM | POA: Diagnosis not present

## 2022-07-19 DIAGNOSIS — R339 Retention of urine, unspecified: Secondary | ICD-10-CM | POA: Diagnosis not present

## 2022-07-19 DIAGNOSIS — E785 Hyperlipidemia, unspecified: Secondary | ICD-10-CM | POA: Diagnosis not present

## 2022-07-19 DIAGNOSIS — F32A Depression, unspecified: Secondary | ICD-10-CM | POA: Diagnosis not present

## 2022-07-19 DIAGNOSIS — G4700 Insomnia, unspecified: Secondary | ICD-10-CM | POA: Diagnosis not present

## 2022-07-23 ENCOUNTER — Other Ambulatory Visit: Payer: Self-pay | Admitting: Family Medicine

## 2022-07-26 ENCOUNTER — Encounter: Payer: Self-pay | Admitting: Student

## 2022-07-26 ENCOUNTER — Ambulatory Visit (INDEPENDENT_AMBULATORY_CARE_PROVIDER_SITE_OTHER): Payer: Medicare Other | Admitting: Student

## 2022-07-26 VITALS — BP 107/61 | HR 121 | Ht 72.0 in | Wt 169.8 lb

## 2022-07-26 DIAGNOSIS — R000 Tachycardia, unspecified: Secondary | ICD-10-CM | POA: Diagnosis not present

## 2022-07-26 DIAGNOSIS — R42 Dizziness and giddiness: Secondary | ICD-10-CM | POA: Insufficient documentation

## 2022-07-26 NOTE — Assessment & Plan Note (Signed)
Orthostatics today were positive and Dix-Hallpike positive. Current differential for dizziness includes orthostatic hypotension secondary to polypharmacy, benign positional vertigo, postconcussive state from TBI.  Patient will need to see PCP for in-depth medication reconciliation.  We will attempt decreasing medication regimen in the meantime. - Stop doxazosin - Follow-up with PCP for medication reconciliation

## 2022-07-26 NOTE — Patient Instructions (Signed)
It was great to see you! Thank you for allowing me to participate in your care!   I recommend that you always bring your medications to each appointment as this makes it easy to ensure we are on the correct medications and helps [place] not miss when refills are needed.  Our plans for today:  - Stop taking doxazosin  - Please follow-up with cardiologist - Please follow-up with PCP to talk about medication, please bring all medication you are taking.  Take care and seek immediate care sooner if you develop any concerns. Please remember to show up 15 minutes before your scheduled appointment time!  Tiffany Kocher, DO [facility]

## 2022-07-26 NOTE — Assessment & Plan Note (Signed)
Tachycardia on presentation, could be related to orthostatic hypotension.  Currently on midodrine and sees cardiology.  I would like him to follow-up with cardiology since he has not seen him since his hospitalization. - Referral to cardiology

## 2022-07-26 NOTE — Progress Notes (Signed)
    SUBJECTIVE:   CHIEF COMPLAINT / HPI:   Dizziness Patient presents for hospital follow-up, he is status postcraniotomy for subdural hematoma.  He was in SNF for 1 week and saw PT/OT.  He is home now, and lives alone.  He reports he is not getting home health.  He says his dizziness was present even before hospitalization.  He has been seen and treated for vertigo in the past.  Symptoms are episodic, and related to standing from seated position.  Denies syncope loss of consciousness, no more falls.  He denies alcohol use, other illicit drug use.  Still smoking half pack a day.  Reports compliance on his medications.  Tachycardia Patient was tachycardic on presentation.  He denies chest pain, palpitations, shortness of breath.  He says he sees a cardiologist, and he takes midodrine.  He has been treated for hypertension in the past, and is also being treated for hypotension with the midodrine.   PERTINENT  PMH / PSH: Hypertension, subdural hematoma status post craniotomy, hyperlipidemia, bipolar disorder  OBJECTIVE:   BP 107/61   Pulse (!) 121   Ht 6' (1.829 m)   Wt 169 lb 12.8 oz (77 kg)   SpO2 99%   BMI 23.03 kg/m    General: NAD, chronically ill-appearing Cardio: Tachycardic, no murmurs rubs or gallops. Orthostatics positive. Pulm: CTAB, normal work of breathing Neuro: Dix-Hallpike positive, Epley performed without relief of dizziness.   ASSESSMENT/PLAN:   Tachycardia Tachycardia on presentation, could be related to orthostatic hypotension.  Currently on midodrine and sees cardiology.  I would like him to follow-up with cardiology since he has not seen him since his hospitalization. - Referral to cardiology  Dizziness Orthostatics today were positive and Dix-Hallpike positive. Current differential for dizziness includes orthostatic hypotension secondary to polypharmacy, benign positional vertigo, postconcussive state from TBI.  Patient will need to see PCP for in-depth  medication reconciliation.  We will attempt decreasing medication regimen in the meantime. - Stop doxazosin - Follow-up with PCP for medication reconciliation    Tiffany Kocher, DO [facility]

## 2022-07-29 DIAGNOSIS — R2681 Unsteadiness on feet: Secondary | ICD-10-CM | POA: Diagnosis not present

## 2022-07-29 DIAGNOSIS — I6200 Nontraumatic subdural hemorrhage, unspecified: Secondary | ICD-10-CM | POA: Diagnosis not present

## 2022-08-10 ENCOUNTER — Emergency Department (HOSPITAL_COMMUNITY): Payer: Medicare Other

## 2022-08-10 ENCOUNTER — Ambulatory Visit: Payer: Medicare Other | Admitting: Student

## 2022-08-10 ENCOUNTER — Encounter (HOSPITAL_COMMUNITY): Payer: Self-pay

## 2022-08-10 ENCOUNTER — Other Ambulatory Visit: Payer: Self-pay

## 2022-08-10 ENCOUNTER — Emergency Department (HOSPITAL_COMMUNITY)
Admission: EM | Admit: 2022-08-10 | Discharge: 2022-08-10 | Disposition: A | Discharge status: Home / Self Care | Payer: Medicare Other | Attending: Emergency Medicine | Admitting: Emergency Medicine

## 2022-08-10 DIAGNOSIS — U071 COVID-19: Secondary | ICD-10-CM | POA: Diagnosis not present

## 2022-08-10 DIAGNOSIS — E876 Hypokalemia: Secondary | ICD-10-CM | POA: Diagnosis not present

## 2022-08-10 DIAGNOSIS — I1310 Hypertensive heart and chronic kidney disease without heart failure, with stage 1 through stage 4 chronic kidney disease, or unspecified chronic kidney disease: Secondary | ICD-10-CM | POA: Diagnosis not present

## 2022-08-10 DIAGNOSIS — Z7982 Long term (current) use of aspirin: Secondary | ICD-10-CM | POA: Diagnosis not present

## 2022-08-10 DIAGNOSIS — E860 Dehydration: Secondary | ICD-10-CM | POA: Diagnosis not present

## 2022-08-10 DIAGNOSIS — R109 Unspecified abdominal pain: Secondary | ICD-10-CM | POA: Diagnosis not present

## 2022-08-10 DIAGNOSIS — I499 Cardiac arrhythmia, unspecified: Secondary | ICD-10-CM | POA: Diagnosis not present

## 2022-08-10 DIAGNOSIS — N189 Chronic kidney disease, unspecified: Secondary | ICD-10-CM | POA: Insufficient documentation

## 2022-08-10 DIAGNOSIS — Z79899 Other long term (current) drug therapy: Secondary | ICD-10-CM | POA: Diagnosis not present

## 2022-08-10 DIAGNOSIS — N179 Acute kidney failure, unspecified: Secondary | ICD-10-CM | POA: Insufficient documentation

## 2022-08-10 DIAGNOSIS — R5383 Other fatigue: Secondary | ICD-10-CM | POA: Diagnosis not present

## 2022-08-10 DIAGNOSIS — Z743 Need for continuous supervision: Secondary | ICD-10-CM | POA: Diagnosis not present

## 2022-08-10 DIAGNOSIS — R112 Nausea with vomiting, unspecified: Secondary | ICD-10-CM | POA: Diagnosis not present

## 2022-08-10 DIAGNOSIS — R1110 Vomiting, unspecified: Secondary | ICD-10-CM | POA: Diagnosis not present

## 2022-08-10 DIAGNOSIS — I700 Atherosclerosis of aorta: Secondary | ICD-10-CM | POA: Diagnosis not present

## 2022-08-10 DIAGNOSIS — J439 Emphysema, unspecified: Secondary | ICD-10-CM | POA: Diagnosis not present

## 2022-08-10 DIAGNOSIS — R000 Tachycardia, unspecified: Secondary | ICD-10-CM | POA: Diagnosis not present

## 2022-08-10 LAB — URINALYSIS, ROUTINE W REFLEX MICROSCOPIC
Glucose, UA: NEGATIVE mg/dL
Hgb urine dipstick: NEGATIVE
Ketones, ur: NEGATIVE mg/dL
Nitrite: NEGATIVE
Protein, ur: 30 mg/dL — AB
Specific Gravity, Urine: 1.015 (ref 1.005–1.030)
pH: 6 (ref 5.0–8.0)

## 2022-08-10 LAB — COMPREHENSIVE METABOLIC PANEL
ALT: 16 U/L (ref 0–44)
AST: 20 U/L (ref 15–41)
Albumin: 4.3 g/dL (ref 3.5–5.0)
Alkaline Phosphatase: 108 U/L (ref 38–126)
Anion gap: 12 (ref 5–15)
BUN: 6 mg/dL — ABNORMAL LOW (ref 8–23)
CO2: 16 mmol/L — ABNORMAL LOW (ref 22–32)
Calcium: 9.6 mg/dL (ref 8.9–10.3)
Chloride: 105 mmol/L (ref 98–111)
Creatinine, Ser: 1.37 mg/dL — ABNORMAL HIGH (ref 0.61–1.24)
GFR, Estimated: 58 mL/min — ABNORMAL LOW (ref >60)
Glucose, Bld: 157 mg/dL — ABNORMAL HIGH (ref 70–99)
Potassium: 2.9 mmol/L — ABNORMAL LOW (ref 3.5–5.1)
Sodium: 133 mmol/L — ABNORMAL LOW (ref 135–145)
Total Bilirubin: 0.7 mg/dL (ref 0.3–1.2)
Total Protein: 7.9 g/dL (ref 6.5–8.1)

## 2022-08-10 LAB — CBC WITH DIFFERENTIAL/PLATELET
Abs Immature Granulocytes: 0.28 10*3/uL — ABNORMAL HIGH (ref 0.00–0.07)
Basophils Absolute: 0.1 10*3/uL (ref 0.0–0.1)
Basophils Relative: 0 %
Eosinophils Absolute: 0 10*3/uL (ref 0.0–0.5)
Eosinophils Relative: 0 %
HCT: 40.4 % (ref 39.0–52.0)
Hemoglobin: 13.3 g/dL (ref 13.0–17.0)
Immature Granulocytes: 2 %
Lymphocytes Relative: 10 %
Lymphs Abs: 1.8 10*3/uL (ref 0.7–4.0)
MCH: 27.4 pg (ref 26.0–34.0)
MCHC: 32.9 g/dL (ref 30.0–36.0)
MCV: 83.3 fL (ref 80.0–100.0)
Monocytes Absolute: 1.5 10*3/uL — ABNORMAL HIGH (ref 0.1–1.0)
Monocytes Relative: 8 %
Neutro Abs: 14.5 10*3/uL — ABNORMAL HIGH (ref 1.7–7.7)
Neutrophils Relative %: 80 %
Platelets: 540 10*3/uL — ABNORMAL HIGH (ref 150–400)
RBC: 4.85 MIL/uL (ref 4.22–5.81)
RDW: 15 % (ref 11.5–15.5)
WBC: 18.1 10*3/uL — ABNORMAL HIGH (ref 4.0–10.5)
nRBC: 0 % (ref 0.0–0.2)

## 2022-08-10 LAB — MAGNESIUM: Magnesium: 1.3 mg/dL — ABNORMAL LOW (ref 1.7–2.4)

## 2022-08-10 LAB — RESP PANEL BY RT-PCR (FLU A&B, COVID) ARPGX2
Influenza A by PCR: NEGATIVE
Influenza B by PCR: NEGATIVE
SARS Coronavirus 2 by RT PCR: POSITIVE — AB

## 2022-08-10 LAB — LIPASE, BLOOD: Lipase: 75 U/L — ABNORMAL HIGH (ref 11–51)

## 2022-08-10 MED ORDER — POTASSIUM CHLORIDE 10 MEQ/100ML IV SOLN
10.0000 meq | INTRAVENOUS | Status: DC
  Administered 2022-08-10 (×2): 10 meq via INTRAVENOUS
  Filled 2022-08-10 (×3): qty 100

## 2022-08-10 MED ORDER — ONDANSETRON 4 MG PO TBDP: 4.0000 mg | ORAL_TABLET | Freq: Three times a day (TID) | ORAL | 0 refills | Status: DC | PRN

## 2022-08-10 MED ORDER — SODIUM CHLORIDE (PF) 0.9 % IJ SOLN
INTRAMUSCULAR | Status: AC
  Filled 2022-08-10: qty 50

## 2022-08-10 MED ORDER — SODIUM CHLORIDE 0.9 % IV BOLUS
1000.0000 mL | Freq: Once | INTRAVENOUS | Status: AC
  Administered 2022-08-10: 1000 mL via INTRAVENOUS

## 2022-08-10 MED ORDER — ONDANSETRON HCL 4 MG/2ML IJ SOLN
4.0000 mg | Freq: Once | INTRAMUSCULAR | Status: AC
  Administered 2022-08-10: 4 mg via INTRAVENOUS
  Filled 2022-08-10: qty 2

## 2022-08-10 MED ORDER — LACTATED RINGERS IV BOLUS
1000.0000 mL | Freq: Once | INTRAVENOUS | Status: AC
  Administered 2022-08-10: 1000 mL via INTRAVENOUS

## 2022-08-10 MED ORDER — LACTATED RINGERS IV BOLUS: 1000.0000 mL | Freq: Once | INTRAVENOUS | Status: DC

## 2022-08-10 MED ORDER — POTASSIUM CHLORIDE CRYS ER 20 MEQ PO TBCR
40.0000 meq | EXTENDED_RELEASE_TABLET | Freq: Once | ORAL | Status: AC
  Administered 2022-08-10: 40 meq via ORAL
  Filled 2022-08-10: qty 2

## 2022-08-10 MED ORDER — MAGNESIUM SULFATE 2 GM/50ML IV SOLN
2.0000 g | Freq: Once | INTRAVENOUS | Status: AC
  Administered 2022-08-10: 2 g via INTRAVENOUS
  Filled 2022-08-10: qty 50

## 2022-08-10 MED ORDER — IOHEXOL 350 MG/ML SOLN
100.0000 mL | Freq: Once | INTRAVENOUS | Status: AC | PRN
  Administered 2022-08-10: 100 mL via INTRAVENOUS

## 2022-08-10 MED ORDER — LEVETIRACETAM 500 MG PO TABS
1500.0000 mg | ORAL_TABLET | Freq: Two times a day (BID) | ORAL | Status: DC
  Administered 2022-08-10: 1500 mg via ORAL
  Filled 2022-08-10: qty 3

## 2022-08-10 NOTE — ED Triage Notes (Signed)
Pt BIB EMS, pt is covid + per home test. Pt complains of fatigue, nausea, vomiting, and diarrhea x 4 days.

## 2022-08-10 NOTE — Progress Notes (Deleted)
  SUBJECTIVE:   CHIEF COMPLAINT / HPI:   Meet PCP and med Rec  PERTINENT  PMH / PSH: ***  Past Medical History:  Diagnosis Date   Acute kidney injury ([place]) 05/26/2018   Allergy    Anemia    Anxiety    Depression    Hyperlipidemia    Hypertension, essential, benign 08/02/2012   Orthostatic hypotension 10/09/2013   OSA (obstructive sleep apnea) 10/01/2015   Severe OSA with an AHI of 86/hr now on BiPAP at 23/19cm H2O   Seizures ([place])    none for last 6-26months   Substance abuse [facility])     Past Surgical History:  Procedure Laterality Date   CRANIOTOMY Left 06/08/2022   Procedure: CRANIOTOMY HEMATOMA EVACUATION SUBDURAL;  Surgeon: Earnie Larsson, MD;  Location: [place];  Service: Neurosurgery;  Laterality: Left;   FRACTURE SURGERY  1990   right hip and femur   HERNIA REPAIR  0/1601   umbilical hernia/ got infected   JOINT REPLACEMENT Right 1990   right hip   TRACHEOSTOMY  08/1999   Had pneumoniaand was in a coma 10 days    OBJECTIVE:  There were no vitals taken for this visit.  General: NAD, pleasant, able to participate in exam Cardiac: RRR, no murmurs auscultated. Respiratory: CTAB, normal effort, no wheezes, rales or rhonchi Abdomen: soft, non-tender, non-distended, normoactive bowel sounds Extremities: warm and well perfused, no edema or cyanosis. Skin: warm and dry, no rashes noted Neuro: alert, no obvious focal deficits, speech normal Psych: Normal affect and mood  ASSESSMENT/PLAN:  No problem-specific Assessment & Plan notes found for this encounter.   No orders of the defined types were placed in this encounter.  No orders of the defined types were placed in this encounter.  No follow-ups on file. @SIGNNOTE @ {    This will disappear when note is signed, click to select method of visit    :1}

## 2022-08-10 NOTE — ED Provider Notes (Signed)
[facility]-EMERGENCY DEPT Provider Note   CSN: [id number] Arrival date & time: 08/10/22  4098     History  Chief Complaint  Patient presents with   Nausea   Emesis   Fatigue    Jose Li is a 66 y.o. male.   Emesis Associated symptoms: abdominal pain, chills, cough, diarrhea and fever   Patient presents for nausea, vomiting, diarrhea, and fatigue.  Medical history includes HLD, OSA, seizures, HTN, CKD, BPH, anxiety, depression, anemia, substance abuse.  He had a recent COVID-19 infection in June.  He discontinued use of Paxlovid at that time due to unwanted side effects.  He additionally had a recent traumatic SDH and underwent craniotomy with evacuation.  This was last month.  He was discharged to skilled nursing facility.  He has since returned home.  He currently lives alone.  He reports COVID-19 type symptoms over the past week.  He tested positive on a home test 2 to 3 days ago.  His symptoms have been worsening and he endorses persistent diarrhea, approximately 5 episodes per day, and p.o. intolerance for the past 3 days.  Despite what he describes p.o. intolerance, he states that he has continued to take his medications without vomiting.  He endorses some mild shortness of breath.  He does not have any known chronic lung disease.  He denies any chest discomfort.      Home Medications Prior to Admission medications   Medication Sig Start Date End Date Taking? Authorizing Provider  ondansetron (ZOFRAN-ODT) 4 MG disintegrating tablet Take 1 tablet (4 mg total) by mouth every 8 (eight) hours as needed for nausea or vomiting. 08/10/22  Yes Gloris Manchester, MD  acetaminophen (TYLENOL) 325 MG tablet Take 2 tablets (650 mg total) by mouth every 4 (four) hours as needed for mild pain (temp > 100.5). 06/29/22   Val Eagle D, NP  aspirin 81 MG EC tablet Take 81 mg by mouth daily. Patient not taking: Reported on 07/26/2022    [provider]  atorvastatin  (LIPITOR) 20 MG tablet Take 1 tablet by mouth once daily Patient taking differently: Take 20 mg by mouth daily. 06/01/22   Pricilla Riffle, MD  benztropine (COGENTIN) 1 MG tablet Take 1 mg by mouth at bedtime.    [provider]  busPIRone (BUSPAR) 5 MG tablet Take 5 mg by mouth 2 (two) times daily.  07/06/17   [provider]  citalopram (CELEXA) 20 MG tablet Take 20 mg by mouth daily.    [provider]  cloNIDine (CATAPRES) 0.1 MG tablet Take 1 tablet (0.1 mg total) by mouth 3 (three) times daily. 06/29/22   Val Eagle D, NP  diclofenac Sodium (VOLTAREN) 1 % GEL Apply 4 g topically 4 (four) times daily. 12/03/21   Dana Allan, MD  EQ ALLERGY RELIEF, CETIRIZINE, 10 MG tablet Take 1 tablet by mouth once daily 07/26/22   Bess Kinds, MD  feeding supplement (ENSURE ENLIVE / ENSURE PLUS) LIQD Take 237 mLs by mouth 3 (three) times daily between meals. 06/29/22   Val Eagle D, NP  levETIRAcetam (KEPPRA) 750 MG tablet Take 2 tablets (1,500 mg total) by mouth 2 (two) times daily. 12/29/21   Glean Salvo, NP  midodrine (PROAMATINE) 5 MG tablet TAKE 2 TABLETS BY MOUTH ONCE DAILY AT  6  AM,  THEN  1  TABLET  AT  10  AM  AND  1  TABLET  AT  2  PM Patient taking differently:  Take 5-10 mg by mouth See admin instructions. Take 10 mg at 6  AM, then take 5 mg at   10 am and 5 mg at 2 pm 12/21/21   Pricilla Riffle, MD  Multiple Vitamin (MULTIVITAMIN) tablet Take 1 tablet by mouth daily.    [provider]  nicotine (NICODERM CQ - DOSED IN MG/24 HR) 7 mg/24hr patch Place 1 patch (7 mg total) onto the skin daily. 06/30/22   Val Eagle D, NP  ondansetron (ZOFRAN) 8 MG tablet Take 1 tablet (8 mg total) by mouth every 8 (eight) hours as needed for nausea or vomiting. 04/21/22   Erick Alley, DO  potassium chloride SA (KLOR-CON) 20 MEQ tablet Take 20 mEq by mouth daily.    [provider]  pregabalin (LYRICA) 75 MG capsule Take 1 capsule (75 mg total) by mouth 2 (two)  times daily. 06/29/22   Val Eagle D, NP  QUEtiapine (SEROQUEL) 50 MG tablet Take 1 tablet (50 mg total) by mouth 2 (two) times daily. 06/29/22   Val Eagle D, NP  risperiDONE (RISPERDAL) 2 MG tablet Take 2 mg by mouth at bedtime.     [provider]  tamsulosin (FLOMAX) 0.4 MG CAPS capsule Take 0.4 mg by mouth daily after supper. 08/20/20   [provider]  thiamine 250 MG tablet Take 250 mg by mouth daily.    [provider]  traZODone (DESYREL) 150 MG tablet Take 150 mg by mouth at bedtime.  09/24/15   [provider]      Allergies    Duloxetine, Erythromycin, Hctz [hydrochlorothiazide], and Propranolol    Review of Systems   Review of Systems  Constitutional:  Positive for activity change, appetite change, chills, diaphoresis, fatigue and fever.  Respiratory:  Positive for cough and shortness of breath.   Gastrointestinal:  Positive for abdominal pain, diarrhea, nausea and vomiting.  All other systems reviewed and are negative.   Physical Exam Updated Vital Signs BP (!) 149/85   Pulse (!) 111   Temp 98.6 F (37 C) (Oral)   Resp (!) 21   Ht 6' (1.829 m)   Wt 72.6 kg   SpO2 100%   BMI 21.70 kg/m  Physical Exam Vitals and nursing note reviewed.  Constitutional:      General: He is not in acute distress.    Appearance: Normal appearance. He is well-developed and normal weight. He is not ill-appearing, toxic-appearing or diaphoretic.  HENT:     Head: Normocephalic and atraumatic.     Right Ear: External ear normal.     Left Ear: External ear normal.     Nose: Nose normal.  Eyes:     Extraocular Movements: Extraocular movements intact.     Conjunctiva/sclera: Conjunctivae normal.  Cardiovascular:     Rate and Rhythm: Regular rhythm. Tachycardia present.     Heart sounds: No murmur heard. Pulmonary:     Effort: Pulmonary effort is normal. No respiratory distress.     Breath sounds: Normal breath sounds. No wheezing or rales.   Chest:     Chest wall: No tenderness.  Abdominal:     Palpations: Abdomen is soft.     Tenderness: There is abdominal tenderness. There is no guarding or rebound.  Musculoskeletal:        General: No swelling. Normal range of motion.     Cervical back: Normal range of motion and neck supple. No rigidity.     Right lower leg: No edema.  Left lower leg: No edema.  Skin:    General: Skin is warm and dry.     Coloration: Skin is not jaundiced or pale.  Neurological:     General: No focal deficit present.     Mental Status: He is alert and oriented to person, place, and time.  Psychiatric:        Mood and Affect: Mood normal.        Behavior: Behavior normal.     ED Results / Procedures / Treatments   Labs (all labs ordered are listed, but only abnormal results are displayed) Labs Reviewed  RESP PANEL BY RT-PCR (FLU A&B, COVID) ARPGX2 - Abnormal; Notable for the following components:      Result Value   SARS Coronavirus 2 by RT PCR POSITIVE (*)    All other components within normal limits  CBC WITH DIFFERENTIAL/PLATELET - Abnormal; Notable for the following components:   WBC 18.1 (*)    Platelets 540 (*)    Neutro Abs 14.5 (*)    Monocytes Absolute 1.5 (*)    Abs Immature Granulocytes 0.28 (*)    All other components within normal limits  COMPREHENSIVE METABOLIC PANEL - Abnormal; Notable for the following components:   Sodium 133 (*)    Potassium 2.9 (*)    CO2 16 (*)    Glucose, Bld 157 (*)    BUN 6 (*)    Creatinine, Ser 1.37 (*)    GFR, Estimated 58 (*)    All other components within normal limits  LIPASE, BLOOD - Abnormal; Notable for the following components:   Lipase 75 (*)    All other components within normal limits  URINALYSIS, ROUTINE W REFLEX MICROSCOPIC - Abnormal; Notable for the following components:   Bilirubin Urine SMALL (*)    Protein, ur 30 (*)    Leukocytes,Ua TRACE (*)    Bacteria, UA RARE (*)    All other components within normal limits   MAGNESIUM - Abnormal; Notable for the following components:   Magnesium 1.3 (*)    All other components within normal limits    EKG EKG Interpretation  Date/Time:  Tuesday August 10 2022 07:59:00 EDT Ventricular Rate:  113 PR Interval:  164 QRS Duration: 89 QT Interval:  324 QTC Calculation: 445 R Axis:   76 Text Interpretation: Sinus tachycardia Probable left atrial enlargement Borderline T wave abnormalities Confirmed by Gloris Manchester ([id number]) on 08/10/2022 8:19:29 AM  Radiology CT ABDOMEN PELVIS W CONTRAST  Result Date: 08/10/2022 CLINICAL DATA:  Nausea, vomiting and abdominal pain. EXAM: CT ABDOMEN AND PELVIS WITH CONTRAST TECHNIQUE: Multidetector CT imaging of the abdomen and pelvis was performed using the standard protocol following bolus administration of intravenous contrast. RADIATION DOSE REDUCTION: This exam was performed according to the departmental dose-optimization program which includes automated exposure control, adjustment of the mA and/or kV according to patient size and/or use of iterative reconstruction technique. CONTRAST:  OMNIPAQUE IOHEXOL 350 MG/ML SOLN COMPARISON:  01/28/2016 FINDINGS: Lower chest: No acute abnormality. Hepatobiliary: No focal liver abnormality is seen. No gallstones, gallbladder wall thickening, or biliary dilatation. Pancreas: Unremarkable. No pancreatic ductal dilatation or surrounding inflammatory changes. Spleen: Normal in size without focal abnormality. Adrenals/Urinary Tract: Normal adrenal glands. Bilateral Bosniak class 1 and 2 kidney cysts identified. The largest cyst is Bosniak class 1 arising off the inferior pole of the right kidney measuring 2.3 cm, image 37/2. No follow-up imaging recommended. No signs of nephrolithiasis or hydronephrosis. Urinary bladder unremarkable. Stomach/Bowel: The stomach appears  normal. The appendix is not confidently identified separate from right lower quadrant bowel loops. No signs of pericecal  inflammation. No abnormal bowel wall thickening, inflammation or distension. Liquid stool identified within the colon with scattered air-fluid levels. Vascular/Lymphatic: Aortic atherosclerosis. No aneurysm. No signs of abdominopelvic adenopathy. Reproductive: Uterus and bilateral adnexa are unremarkable. Other: There is no free fluid or fluid collections. No signs of pneumoperitoneum. Musculoskeletal: Previous ORIF of the right femur. Multiple remote healed left posterior and lateral rib fractures. IMPRESSION: 1. No acute findings within the abdomen or pelvis. 2. Liquid stool identified within the colon with scattered air-fluid levels, which may reflect diarrheal state. 3. Aortic Atherosclerosis (ICD10-I70.0). Electronically Signed   By: Signa Kellaylor  Stroud M.D.   On: 08/10/2022 10:37   CT Angio Chest PE W and/or Wo Contrast  Result Date: 08/10/2022 CLINICAL DATA:  Fatigue nausea vomiting diarrhea. EXAM: CT ANGIOGRAPHY CHEST WITH CONTRAST TECHNIQUE: Multidetector CT imaging of the chest was performed using the standard protocol during bolus administration of intravenous contrast. Multiplanar CT image reconstructions and MIPs were obtained to evaluate the vascular anatomy. RADIATION DOSE REDUCTION: This exam was performed according to the departmental dose-optimization program which includes automated exposure control, adjustment of the mA and/or kV according to patient size and/or use of iterative reconstruction technique. CONTRAST:  100mL OMNIPAQUE IOHEXOL 350 MG/ML SOLN COMPARISON:  None Available. FINDINGS: Cardiovascular: Satisfactory opacification of the pulmonary arteries to the segmental level. No evidence of pulmonary embolism. Normal heart size. No pericardial effusion. Mediastinum/Nodes: No enlarged mediastinal, hilar, or axillary lymph nodes. Thyroid gland, trachea, and esophagus demonstrate no significant findings. Lungs/Pleura: Lungs are clear. No pleural effusion or pneumothorax. Mild centrilobular and  paraseptal emphysema. Upper Abdomen: See separately dictated CT abdomen and pelvis for additional findings. Musculoskeletal: No chest wall abnormality. No acute or significant osseous findings. Review of the MIP images confirms the above findings. IMPRESSION: Negative for acute PE. Electronically Signed   By: Lorenza CambridgeHemant  Desai M.D.   On: 08/10/2022 10:32   DG Chest 1 View  Result Date: 08/10/2022 CLINICAL DATA:  [age] year old male with history of shortness of breath. Positive COVID test. Fatigue, nausea and vomiting. EXAM: CHEST  1 VIEW COMPARISON:  Chest x-ray 06/07/2022. FINDINGS: Lung volumes are low. No consolidative airspace disease. No pleural effusions. No pneumothorax. No pulmonary nodule or mass noted. Pulmonary vasculature and the cardiomediastinal silhouette are within normal limits. Multiple old healed bilateral rib fractures. IMPRESSION: 1. Low lung volumes without radiographic evidence of acute cardiopulmonary disease. Electronically Signed   By: Trudie Reedaniel  Entrikin M.D.   On: 08/10/2022 07:46    Procedures Procedures    Medications Ordered in ED Medications  potassium chloride 10 mEq in 100 mL IVPB (10 mEq Intravenous New Bag/Given 08/10/22 0959)  levETIRAcetam (KEPPRA) tablet 1,500 mg (1,500 mg Oral Given 08/10/22 1019)  magnesium sulfate IVPB 2 g 50 mL (2 g Intravenous New Bag/Given 08/10/22 1022)  sodium chloride (PF) 0.9 % injection (  Not Given 08/10/22 1022)  lactated ringers bolus 1,000 mL (0 mLs Intravenous Stopped 08/10/22 1022)  ondansetron (ZOFRAN) injection 4 mg (4 mg Intravenous Given 08/10/22 0839)  potassium chloride SA (KLOR-CON M) CR tablet 40 mEq (40 mEq Oral Given 08/10/22 0858)  sodium chloride 0.9 % bolus 1,000 mL (1,000 mLs Intravenous New Bag/Given 08/10/22 0900)  iohexol (OMNIPAQUE) 350 MG/ML injection 100 mL (100 mLs Intravenous Contrast Given 08/10/22 1000)    ED Course/ Medical Decision Making/ A&P  Medical Decision Making Amount and/or  Complexity of Data Reviewed Labs: ordered. Radiology: ordered.  Risk Prescription drug management.   This patient presents to the ED for concern of nausea, vomiting, diarrhea, fatigue, and shortness of breath, this involves an extensive number of treatment options, and is a complaint that carries with it a high risk of complications and morbidity.  The differential diagnosis includes symptoms of COVID-19, other URI, dehydration, enteritis, colitis, PE, pneumonia   Co morbidities that complicate the patient evaluation  HLD, OSA, seizures, HTN, CKD, BPH, anxiety, depression, anemia, substance abuse, SDH   Additional history obtained:  Additional history obtained from N/A External records from outside source obtained and reviewed including EMR   Lab Tests:  I Ordered, and personally interpreted labs.  The pertinent results include: AKI, hypokalemia, hypomagnesemia, non-anion gap metabolic acidosis, slight elevation in lipase, normal hemoglobin with a leukocytosis and neutrophilia   Imaging Studies ordered:  I ordered imaging studies including chest x-ray, CTA chest, CT of abdomen pelvis I independently visualized and interpreted imaging which showed findings consistent with diarrheal illness with no other acute findings I agree with the radiologist interpretation   Cardiac Monitoring: / EKG:  The patient was maintained on a cardiac monitor.  I personally viewed and interpreted the cardiac monitored which showed an underlying rhythm of: Sinus rhythm  Problem List / ED Course / Critical interventions / Medication management  Patient presents for symptoms of nausea, vomiting, diarrhea, fatigue, shortness of breath over the past week.  He does state that he tested positive for COVID-19 on a home test.  Symptoms are consistent with COVID-19 illness.  On arrival in the ED, vital signs are notable for hypertension and tachycardia.  Given his p.o. intolerance and fluid losses, patient to  be given IV fluids for dehydration.  He did take Tylenol at 4 AM this morning.  He does endorse current nausea and Zofran was provided.  On exam, his breathing is unlabored and his lungs are clear to auscultation.  His abdomen is soft but he does endorse some mild generalized pain and tenderness.  He attributes this to recent vomiting.  Laboratory work-up was initiated.  CBC shows leukocytosis with left shift.  This could be secondary to stress demargination and/or superimposed bacterial infection.  Chest x-ray was without areas of focal opacity.  Further lab work is also notable for AKI, metabolic acidosis, and hypokalemia.  Additional IV fluids were ordered in addition to potassium replacement.  Magnesium was also found to be low and replacement was ordered.  Patient went CTA of chest and CT imaging of abdomen and pelvis, given his shortness of breath and diffuse abdominal pain and tenderness.  Results of imaging showed fluid-filled colon consistent with diarrheal illness.  There were no other acute findings.  On reassessment, patient reported resolution of symptoms.  He does request discharge home at this time and states that he will leave AMA otherwise.  Given his improved symptoms, I feel that discharge is reasonable.  He was informed of his AKI and advised to drink plenty of water to replace any fluid losses he has from any further diarrhea.  He states that he currently takes Pepto-Bismol for nausea.  He was prescribed ODT Zofran to take as needed for any further nausea in order to stay hydrated.  He states that he does have potassium and magnesium supplements at home.  He was advised to continue these over the next several days if he does have persistent diarrhea.  He was  also encouraged to return for any new or worsening symptoms.  He was discharged in stable condition. I ordered medication including IV fluids for dehydration; Zofran for nausea; magnesium sulfate for hypomagnesemia; potassium chloride for  hypokalemia Reevaluation of the patient after these medicines showed that the patient resolved I have reviewed the patients home medicines and have made adjustments as needed   Social Determinants of Health:  Lives independently        Final Clinical Impression(s) / ED Diagnoses Final diagnoses:  Dehydration  AKI (acute kidney injury) ([place])  Hypokalemia  Hypomagnesemia  COVID-19    Rx / DC Orders ED Discharge Orders          Ordered    ondansetron (ZOFRAN-ODT) 4 MG disintegrating tablet  Every 8 hours PRN        08/10/22 1104              Godfrey Pick, MD 08/10/22 1108

## 2022-08-10 NOTE — Discharge Instructions (Addendum)
Your lab work shows an acute kidney injury.  This is caused by dehydration.  Additionally, your potassium and magnesium are low.  This is common in the setting of persistent diarrhea.  You were given replacement potassium and magnesium while in the emergency department.  The treatment for your kidney injury is to stay hydrated.  If you do have continued diarrhea, you will need to replace fluid losses by drinking plenty of water.  A prescription for Zofran was sent to her pharmacy.  This is a medication that treats nausea.  Take this as needed.  You should take your home potassium and magnesium supplements daily as long as you continue to have diarrhea.  You should get repeat lab work done sometime this week to ensure that your kidney injury is improving.  Please return to the emergency department at any time for any persistent or worsening symptoms.

## 2022-08-10 NOTE — ED Provider Triage Note (Signed)
Emergency Medicine Provider Triage Evaluation Note  AZELL BILL , a 66 y.o. male  was evaluated in triage.  Pt complains of fatigue, nausea, vomiting, diarrhea x4 days.  Patient tested positive for COVID with an at-home test.  He also endorses shortness of breath.  No chest pain.  Review of Systems  Positive: V/D Negative: CP  Physical Exam  BP (!) 151/88   Pulse (!) 140   Temp 98.1 F (36.7 C) (Oral)   Resp 18   Ht 6' (1.829 m)   Wt 72.6 kg   SpO2 98%   BMI 21.70 kg/m  Gen:   Awake, no distress   Resp:  Normal effort  MSK:   Moves extremities without difficulty  Other:    Medical Decision Making  Medically screening exam initiated at 7:29 AM.  Appropriate orders placed.  [name] was informed that the remainder of the evaluation will be completed by another provider, this initial triage assessment does not replace that evaluation, and the importance of remaining in the ED until their evaluation is complete.  Labs, EKG, CXR   Suzy Bouchard, [place] 08/10/22 0730

## 2022-08-23 ENCOUNTER — Other Ambulatory Visit: Payer: Self-pay | Admitting: Neurology

## 2022-08-24 ENCOUNTER — Other Ambulatory Visit: Payer: Self-pay

## 2022-08-24 ENCOUNTER — Emergency Department (HOSPITAL_COMMUNITY): Payer: Medicare Other

## 2022-08-24 ENCOUNTER — Encounter (HOSPITAL_COMMUNITY): Payer: Self-pay

## 2022-08-24 ENCOUNTER — Inpatient Hospital Stay (HOSPITAL_COMMUNITY)
Admission: EM | Admit: 2022-08-24 | Discharge: 2022-08-27 | DRG: 558 | Disposition: A | Discharge status: Skilled Nursing Facility | Payer: Medicare Other | Attending: Internal Medicine | Admitting: Internal Medicine

## 2022-08-24 DIAGNOSIS — E785 Hyperlipidemia, unspecified: Secondary | ICD-10-CM | POA: Diagnosis present

## 2022-08-24 DIAGNOSIS — R262 Difficulty in walking, not elsewhere classified: Secondary | ICD-10-CM | POA: Diagnosis present

## 2022-08-24 DIAGNOSIS — Z87820 Personal history of traumatic brain injury: Secondary | ICD-10-CM | POA: Diagnosis not present

## 2022-08-24 DIAGNOSIS — N400 Enlarged prostate without lower urinary tract symptoms: Secondary | ICD-10-CM | POA: Diagnosis present

## 2022-08-24 DIAGNOSIS — R5381 Other malaise: Secondary | ICD-10-CM | POA: Diagnosis not present

## 2022-08-24 DIAGNOSIS — F319 Bipolar disorder, unspecified: Secondary | ICD-10-CM | POA: Diagnosis present

## 2022-08-24 DIAGNOSIS — F17210 Nicotine dependence, cigarettes, uncomplicated: Secondary | ICD-10-CM | POA: Diagnosis present

## 2022-08-24 DIAGNOSIS — E876 Hypokalemia: Secondary | ICD-10-CM | POA: Diagnosis present

## 2022-08-24 DIAGNOSIS — M6282 Rhabdomyolysis: Secondary | ICD-10-CM | POA: Diagnosis not present

## 2022-08-24 DIAGNOSIS — Z743 Need for continuous supervision: Secondary | ICD-10-CM | POA: Diagnosis not present

## 2022-08-24 DIAGNOSIS — Z8249 Family history of ischemic heart disease and other diseases of the circulatory system: Secondary | ICD-10-CM | POA: Diagnosis not present

## 2022-08-24 DIAGNOSIS — J302 Other seasonal allergic rhinitis: Secondary | ICD-10-CM | POA: Diagnosis not present

## 2022-08-24 DIAGNOSIS — Z7401 Bed confinement status: Secondary | ICD-10-CM | POA: Diagnosis not present

## 2022-08-24 DIAGNOSIS — R7989 Other specified abnormal findings of blood chemistry: Secondary | ICD-10-CM | POA: Diagnosis present

## 2022-08-24 DIAGNOSIS — Z9181 History of falling: Secondary | ICD-10-CM | POA: Diagnosis not present

## 2022-08-24 DIAGNOSIS — Z79899 Other long term (current) drug therapy: Secondary | ICD-10-CM | POA: Diagnosis not present

## 2022-08-24 DIAGNOSIS — Z881 Allergy status to other antibiotic agents status: Secondary | ICD-10-CM | POA: Diagnosis not present

## 2022-08-24 DIAGNOSIS — Z888 Allergy status to other drugs, medicaments and biological substances status: Secondary | ICD-10-CM | POA: Diagnosis not present

## 2022-08-24 DIAGNOSIS — Z87438 Personal history of other diseases of male genital organs: Secondary | ICD-10-CM | POA: Diagnosis present

## 2022-08-24 DIAGNOSIS — S199XXA Unspecified injury of neck, initial encounter: Secondary | ICD-10-CM | POA: Diagnosis not present

## 2022-08-24 DIAGNOSIS — I10 Essential (primary) hypertension: Secondary | ICD-10-CM | POA: Diagnosis present

## 2022-08-24 DIAGNOSIS — R531 Weakness: Secondary | ICD-10-CM | POA: Diagnosis not present

## 2022-08-24 DIAGNOSIS — G4733 Obstructive sleep apnea (adult) (pediatric): Secondary | ICD-10-CM | POA: Diagnosis present

## 2022-08-24 DIAGNOSIS — F419 Anxiety disorder, unspecified: Secondary | ICD-10-CM | POA: Diagnosis present

## 2022-08-24 DIAGNOSIS — E860 Dehydration: Secondary | ICD-10-CM | POA: Diagnosis not present

## 2022-08-24 DIAGNOSIS — G40909 Epilepsy, unspecified, not intractable, without status epilepticus: Secondary | ICD-10-CM | POA: Diagnosis present

## 2022-08-24 DIAGNOSIS — I2489 Other forms of acute ischemic heart disease: Secondary | ICD-10-CM | POA: Diagnosis present

## 2022-08-24 DIAGNOSIS — R6889 Other general symptoms and signs: Secondary | ICD-10-CM | POA: Diagnosis not present

## 2022-08-24 DIAGNOSIS — F17200 Nicotine dependence, unspecified, uncomplicated: Secondary | ICD-10-CM | POA: Diagnosis present

## 2022-08-24 DIAGNOSIS — A419 Sepsis, unspecified organism: Secondary | ICD-10-CM | POA: Diagnosis not present

## 2022-08-24 DIAGNOSIS — R1310 Dysphagia, unspecified: Secondary | ICD-10-CM | POA: Diagnosis present

## 2022-08-24 DIAGNOSIS — I951 Orthostatic hypotension: Secondary | ICD-10-CM | POA: Diagnosis not present

## 2022-08-24 DIAGNOSIS — M542 Cervicalgia: Secondary | ICD-10-CM | POA: Diagnosis not present

## 2022-08-24 HISTORY — DX: Rhabdomyolysis: M62.82

## 2022-08-24 LAB — COMPREHENSIVE METABOLIC PANEL
ALT: 25 U/L (ref 0–44)
AST: 42 U/L — ABNORMAL HIGH (ref 15–41)
Albumin: 4.2 g/dL (ref 3.5–5.0)
Alkaline Phosphatase: 113 U/L (ref 38–126)
Anion gap: 7 (ref 5–15)
BUN: 7 mg/dL — ABNORMAL LOW (ref 8–23)
CO2: 22 mmol/L (ref 22–32)
Calcium: 9.2 mg/dL (ref 8.9–10.3)
Chloride: 106 mmol/L (ref 98–111)
Creatinine, Ser: 1.25 mg/dL — ABNORMAL HIGH (ref 0.61–1.24)
GFR, Estimated: >60 mL/min (ref >60)
Glucose, Bld: 103 mg/dL — ABNORMAL HIGH (ref 70–99)
Potassium: 2.3 mmol/L — CL (ref 3.5–5.1)
Sodium: 135 mmol/L (ref 135–145)
Total Bilirubin: 0.6 mg/dL (ref 0.3–1.2)
Total Protein: 7.3 g/dL (ref 6.5–8.1)

## 2022-08-24 LAB — CBC WITH DIFFERENTIAL/PLATELET
Abs Immature Granulocytes: 0.08 10*3/uL — ABNORMAL HIGH (ref 0.00–0.07)
Basophils Absolute: 0.1 10*3/uL (ref 0.0–0.1)
Basophils Relative: 1 %
Eosinophils Absolute: 0.1 10*3/uL (ref 0.0–0.5)
Eosinophils Relative: 1 %
HCT: 42.7 % (ref 39.0–52.0)
Hemoglobin: 14.3 g/dL (ref 13.0–17.0)
Immature Granulocytes: 1 %
Lymphocytes Relative: 17 %
Lymphs Abs: 2.4 10*3/uL (ref 0.7–4.0)
MCH: 27.4 pg (ref 26.0–34.0)
MCHC: 33.5 g/dL (ref 30.0–36.0)
MCV: 82 fL (ref 80.0–100.0)
Monocytes Absolute: 1.3 10*3/uL — ABNORMAL HIGH (ref 0.1–1.0)
Monocytes Relative: 9 %
Neutro Abs: 10.4 10*3/uL — ABNORMAL HIGH (ref 1.7–7.7)
Neutrophils Relative %: 71 %
Platelets: 475 10*3/uL — ABNORMAL HIGH (ref 150–400)
RBC: 5.21 MIL/uL (ref 4.22–5.81)
RDW: 16 % — ABNORMAL HIGH (ref 11.5–15.5)
WBC: 14.3 10*3/uL — ABNORMAL HIGH (ref 4.0–10.5)
nRBC: 0 % (ref 0.0–0.2)

## 2022-08-24 LAB — I-STAT CHEM 8, ED
BUN: 5 mg/dL — ABNORMAL LOW (ref 8–23)
Calcium, Ion: 1.12 mmol/L — ABNORMAL LOW (ref 1.15–1.40)
Chloride: 102 mmol/L (ref 98–111)
Creatinine, Ser: 1.1 mg/dL (ref 0.61–1.24)
Glucose, Bld: 102 mg/dL — ABNORMAL HIGH (ref 70–99)
HCT: 38 % — ABNORMAL LOW (ref 39.0–52.0)
Hemoglobin: 12.9 g/dL — ABNORMAL LOW (ref 13.0–17.0)
Potassium: 3 mmol/L — ABNORMAL LOW (ref 3.5–5.1)
Sodium: 138 mmol/L (ref 135–145)
TCO2: 24 mmol/L (ref 22–32)

## 2022-08-24 LAB — LACTIC ACID, PLASMA
Lactic Acid, Venous: 1 mmol/L (ref 0.5–1.9)
Lactic Acid, Venous: 1.4 mmol/L (ref 0.5–1.9)

## 2022-08-24 LAB — PROTIME-INR
INR: 0.9 (ref 0.8–1.2)
Prothrombin Time: 11.8 seconds (ref 11.4–15.2)

## 2022-08-24 LAB — URINALYSIS, ROUTINE W REFLEX MICROSCOPIC
Bilirubin Urine: NEGATIVE
Glucose, UA: NEGATIVE mg/dL
Hgb urine dipstick: NEGATIVE
Ketones, ur: NEGATIVE mg/dL
Leukocytes,Ua: NEGATIVE
Nitrite: NEGATIVE
Protein, ur: NEGATIVE mg/dL
Specific Gravity, Urine: 1.005 (ref 1.005–1.030)
pH: 7 (ref 5.0–8.0)

## 2022-08-24 LAB — LIPASE, BLOOD: Lipase: 64 U/L — ABNORMAL HIGH (ref 11–51)

## 2022-08-24 LAB — TROPONIN I (HIGH SENSITIVITY)
Troponin I (High Sensitivity): 33 ng/L — ABNORMAL HIGH (ref <18)
Troponin I (High Sensitivity): 25 ng/L — ABNORMAL HIGH (ref <18)

## 2022-08-24 LAB — CK: Total CK: 1613 U/L — ABNORMAL HIGH (ref 49–397)

## 2022-08-24 LAB — MAGNESIUM: Magnesium: 1.2 mg/dL — ABNORMAL LOW (ref 1.7–2.4)

## 2022-08-24 MED ORDER — BUSPIRONE HCL 5 MG PO TABS
5.0000 mg | ORAL_TABLET | Freq: Two times a day (BID) | ORAL | Status: DC
  Administered 2022-08-24 – 2022-08-27 (×7): 5 mg via ORAL
  Filled 2022-08-24 (×7): qty 1

## 2022-08-24 MED ORDER — SODIUM CHLORIDE 0.9 % IV SOLN: Freq: Once | INTRAVENOUS | Status: AC

## 2022-08-24 MED ORDER — MIDODRINE HCL 5 MG PO TABS
5.0000 mg | ORAL_TABLET | Freq: Every day | ORAL | Status: DC
  Administered 2022-08-24 – 2022-08-27 (×4): 5 mg via ORAL
  Filled 2022-08-24 (×4): qty 1

## 2022-08-24 MED ORDER — CITALOPRAM HYDROBROMIDE 20 MG PO TABS
20.0000 mg | ORAL_TABLET | Freq: Every day | ORAL | Status: DC
  Administered 2022-08-24 – 2022-08-27 (×4): 20 mg via ORAL
  Filled 2022-08-24: qty 1
  Filled 2022-08-24: qty 2
  Filled 2022-08-24 (×2): qty 1

## 2022-08-24 MED ORDER — MIDODRINE HCL 5 MG PO TABS
5.0000 mg | ORAL_TABLET | ORAL | Status: DC
  Administered 2022-08-24 – 2022-08-26 (×3): 5 mg via ORAL
  Filled 2022-08-24 (×3): qty 1

## 2022-08-24 MED ORDER — POTASSIUM CHLORIDE IN NACL 40-0.9 MEQ/L-% IV SOLN
INTRAVENOUS | Status: DC
  Filled 2022-08-24: qty 1000

## 2022-08-24 MED ORDER — PREGABALIN 75 MG PO CAPS
75.0000 mg | ORAL_CAPSULE | Freq: Two times a day (BID) | ORAL | Status: DC
  Administered 2022-08-24 – 2022-08-27 (×7): 75 mg via ORAL
  Filled 2022-08-24 (×7): qty 1

## 2022-08-24 MED ORDER — SODIUM CHLORIDE 0.9 % IV BOLUS (SEPSIS)
1000.0000 mL | Freq: Once | INTRAVENOUS | Status: AC
  Administered 2022-08-24: 1000 mL via INTRAVENOUS

## 2022-08-24 MED ORDER — POTASSIUM CHLORIDE CRYS ER 20 MEQ PO TBCR
40.0000 meq | EXTENDED_RELEASE_TABLET | Freq: Once | ORAL | Status: AC
  Administered 2022-08-24: 40 meq via ORAL
  Filled 2022-08-24: qty 2

## 2022-08-24 MED ORDER — TRAZODONE HCL 50 MG PO TABS
150.0000 mg | ORAL_TABLET | Freq: Every day | ORAL | Status: DC
  Administered 2022-08-24 – 2022-08-26 (×3): 150 mg via ORAL
  Filled 2022-08-24 (×3): qty 1

## 2022-08-24 MED ORDER — SODIUM CHLORIDE 0.9 % IV BOLUS
1000.0000 mL | Freq: Once | INTRAVENOUS | Status: AC
  Administered 2022-08-24: 1000 mL via INTRAVENOUS

## 2022-08-24 MED ORDER — ATORVASTATIN CALCIUM 10 MG PO TABS
20.0000 mg | ORAL_TABLET | Freq: Every day | ORAL | Status: DC
  Administered 2022-08-24 – 2022-08-27 (×4): 20 mg via ORAL
  Filled 2022-08-24 (×4): qty 2

## 2022-08-24 MED ORDER — ASPIRIN 81 MG PO TBEC
81.0000 mg | DELAYED_RELEASE_TABLET | Freq: Every day | ORAL | Status: DC
  Administered 2022-08-24 – 2022-08-27 (×4): 81 mg via ORAL
  Filled 2022-08-24 (×4): qty 1

## 2022-08-24 MED ORDER — MIDODRINE HCL 5 MG PO TABS
10.0000 mg | ORAL_TABLET | Freq: Every day | ORAL | Status: DC
  Administered 2022-08-25 – 2022-08-27 (×3): 10 mg via ORAL
  Filled 2022-08-24 (×3): qty 2

## 2022-08-24 MED ORDER — ENSURE ENLIVE PO LIQD
237.0000 mL | Freq: Two times a day (BID) | ORAL | Status: DC | PRN
  Filled 2022-08-24: qty 237

## 2022-08-24 MED ORDER — RISPERIDONE 2 MG PO TABS
2.0000 mg | ORAL_TABLET | Freq: Every day | ORAL | Status: DC
  Administered 2022-08-24 – 2022-08-26 (×3): 2 mg via ORAL
  Filled 2022-08-24 (×3): qty 1

## 2022-08-24 MED ORDER — LORATADINE 10 MG PO TABS
10.0000 mg | ORAL_TABLET | Freq: Every day | ORAL | Status: DC
  Administered 2022-08-24 – 2022-08-27 (×4): 10 mg via ORAL
  Filled 2022-08-24 (×4): qty 1

## 2022-08-24 MED ORDER — SODIUM CHLORIDE 0.9 % IV BOLUS (SEPSIS): 1000.0000 mL | Freq: Once | INTRAVENOUS | Status: DC

## 2022-08-24 MED ORDER — THIAMINE HCL 100 MG PO TABS: 250.0000 mg | ORAL_TABLET | Freq: Every day | ORAL | Status: DC

## 2022-08-24 MED ORDER — LACTATED RINGERS IV SOLN: INTRAVENOUS | Status: DC

## 2022-08-24 MED ORDER — POTASSIUM CHLORIDE 10 MEQ/100ML IV SOLN
10.0000 meq | Freq: Once | INTRAVENOUS | Status: AC
  Administered 2022-08-24: 10 meq via INTRAVENOUS
  Filled 2022-08-24: qty 100

## 2022-08-24 MED ORDER — BENZTROPINE MESYLATE 1 MG PO TABS
1.0000 mg | ORAL_TABLET | Freq: Every day | ORAL | Status: DC
  Administered 2022-08-24 – 2022-08-26 (×3): 1 mg via ORAL
  Filled 2022-08-24 (×3): qty 1

## 2022-08-24 MED ORDER — TAMSULOSIN HCL 0.4 MG PO CAPS
0.4000 mg | ORAL_CAPSULE | Freq: Every day | ORAL | Status: DC
  Administered 2022-08-24 – 2022-08-26 (×3): 0.4 mg via ORAL
  Filled 2022-08-24 (×3): qty 1

## 2022-08-24 MED ORDER — MIDODRINE HCL 5 MG PO TABS: 5.0000 mg | ORAL_TABLET | ORAL | Status: DC

## 2022-08-24 MED ORDER — ACETAMINOPHEN 325 MG PO TABS
650.0000 mg | ORAL_TABLET | Freq: Four times a day (QID) | ORAL | Status: DC | PRN
  Administered 2022-08-26 – 2022-08-27 (×3): 650 mg via ORAL
  Filled 2022-08-24 (×4): qty 2

## 2022-08-24 MED ORDER — NICOTINE 21 MG/24HR TD PT24
21.0000 mg | MEDICATED_PATCH | Freq: Every day | TRANSDERMAL | Status: DC
  Administered 2022-08-24 – 2022-08-27 (×4): 21 mg via TRANSDERMAL
  Filled 2022-08-24 (×4): qty 1

## 2022-08-24 MED ORDER — POTASSIUM CHLORIDE CRYS ER 20 MEQ PO TBCR
40.0000 meq | EXTENDED_RELEASE_TABLET | Freq: Every day | ORAL | Status: DC
  Administered 2022-08-24: 40 meq via ORAL
  Filled 2022-08-24: qty 2

## 2022-08-24 MED ORDER — ACETAMINOPHEN 650 MG RE SUPP: 650.0000 mg | Freq: Four times a day (QID) | RECTAL | Status: DC | PRN

## 2022-08-24 MED ORDER — THIAMINE MONONITRATE 100 MG PO TABS
250.0000 mg | ORAL_TABLET | Freq: Every day | ORAL | Status: DC
  Administered 2022-08-24 – 2022-08-27 (×4): 250 mg via ORAL
  Filled 2022-08-24 (×4): qty 3

## 2022-08-24 MED ORDER — LEVETIRACETAM 500 MG PO TABS
1500.0000 mg | ORAL_TABLET | Freq: Two times a day (BID) | ORAL | Status: DC
  Administered 2022-08-24 – 2022-08-27 (×7): 1500 mg via ORAL
  Filled 2022-08-24 (×7): qty 3

## 2022-08-24 MED ORDER — MAGNESIUM SULFATE 2 GM/50ML IV SOLN
2.0000 g | Freq: Once | INTRAVENOUS | Status: AC
  Administered 2022-08-24: 2 g via INTRAVENOUS
  Filled 2022-08-24: qty 50

## 2022-08-24 MED ORDER — ENSURE PO LIQD: 237.0000 mL | Freq: Two times a day (BID) | ORAL | Status: DC | PRN

## 2022-08-24 NOTE — Progress Notes (Signed)
  Carryover admission to the Day Admitter.  I discussed this case with the EDP, Dr. Leonette Monarch.  Per these discussions:   This is a 66 year old male with history of subdural hematoma in July 2023 status post craniotomy, known history of orthostatic hypotension, who is being admitted with orthostatic hypotension, resulting in fall to the floor without loss of consciousness, prolonged downtime, mildly elevated CPK level, and hypokalemia.  Yesterday, the patient reports that he attempted to rise from a seated to standing position, became dizzy lightheaded resulting in a fall to the floor, without loss of consciousness, and without any associated chest pain.  It reportedly took a little while for the patient to be able to get to a food to call EMS, resulting in several hours and when she was laying on the floor before EMS arrived.  He did not hit his head as a component of the above fall, and is not on a blood thinners.  Of note, he reportedly recovered from COVID-19 infection several weeks ago.  Of note, the context of his history of orthostatic hypotension, he is on midodrine at home.  Outpatient medications also notable for clonidine.   In the ED this evening, he was reportedly significantly orthostatic.  Received IV fluids, and once again found to be orthostatic.  Labs notable for very mild elevation in troponin that is already trending down.  Mildly elevated CPK at 1300.  Hypokalemia, prompting supplementation with both IV and p.o. potassium.  EKG reportedly nonischemic.  CT head and neck showed no acute process, including intracranial hemorrhage.  Chest x-ray reportedly showed no acute process.  I have placed an order for inpatient admission to med telemetry.  I have placed some additional preliminary admit orders via the adult multi-morbid admission order set.  He is receiving additional IV fluids at this time.  I have also ordered fall precautions and added-on a serum magnesium level.      Babs Bertin, DO Hospitalist

## 2022-08-24 NOTE — ED Triage Notes (Signed)
Pt states that he has been having issues ambulating for the last 7 days, before that he normally walks around with no problem. He said today his legs just quit working.  Able to move both legs but not able to bear weight. Seems really drowsy.

## 2022-08-24 NOTE — Progress Notes (Signed)
PT refused BiPAP. [facility] is in the PT room.

## 2022-08-24 NOTE — H&P (Signed)
History and Physical    Patient: Jose Li PZW:[id number] DOB: Aug 06, 1956 DOA: 08/24/2022 DOS: the patient was seen and examined on 08/24/2022 PCP: Bess Kinds, MD  Patient coming from: Home  Chief Complaint:  Chief Complaint  Patient presents with   Weakness   HPI: Jose Li is a 66 y.o. male with medical history significant of seasonal allergies, anemia, anxiety, depression, hyperlipidemia, hypertension, orthostatic hypotension, OSA on BiPAP, seizure disorder secondary to EtOH withdrawal, substance abuse, history of AKI who was admitted and discharged to [facility] from 06/07/2022 until 07/04/2022 after requiring craniotomy by neurosurgery for for left-sided subdural hematoma with postop period complicated by delirium who was brought to the emergency department due to having issues ambulating for the past 7 days after he fell that his legs quit working.  He has hassling lower extremities spasms.  He stated he has not been drinking alcohol for the past week. He denied fever, chills, rhinorrhea, sore throat, wheezing or hemoptysis.  No chest pain, palpitations, diaphoresis, PND, orthopnea or recent pitting edema of the lower extremities.  No abdominal pain, nausea, emesis, diarrhea, constipation, melena or hematochezia.  No flank pain, dysuria, frequency or hematuria.  No polyuria, polydipsia, polyphagia or blurred vision.   ED course: Initial vital signs were temperature 98 F, pulse 90, respiration 18, BP 130/80 mmHg O2 sat 100% on room air.  Patient received 3000 mL of LR bolus, KCl 40 mEq p.o. x1, KCl 10 mEq IVPB x1 and magnesium sulfate 2 g IVPB.  Lab work: CBC white count was 14.3, hemoglobin 14.3 g/dL platelets 423.  Lactic acid was normal x2.  Troponin mildly elevated at 33 ng/L.  Total CK was 1613 units/L.  Magnesium 1.2 mg/dL.  CMP showed a potassium of 2.3 mmol/L, the rest of the electrolytes were normal.  Glucose 103, BUN 7 creatinine 1.25 mg/dL AST was 42 units/L, the  rest of the LFTs were normal.  Imaging: Portable 1 view chest radiograph with minimal left basilar atelectasis or scaring.  CT head and CT C-spine with no acute intracranial abnormality.  No calvarial fracture.  There was no acute fracture or listhesis of the cervical spine.   Review of Systems: As mentioned in the history of present illness. All other systems reviewed and are negative. Past Medical History:  Diagnosis Date   Acute kidney injury (HCC) 05/26/2018   Allergy    Anemia    Anxiety    Depression    Hyperlipidemia    Hypertension, essential, benign 08/02/2012   Orthostatic hypotension 10/09/2013   OSA (obstructive sleep apnea) 10/01/2015   Severe OSA with an AHI of 86/hr now on BiPAP at 23/19cm H2O   Seizures (HCC)    none for last 6-1months   Substance abuse [facility])    Past Surgical History:  Procedure Laterality Date   CRANIOTOMY Left 06/08/2022   Procedure: CRANIOTOMY HEMATOMA EVACUATION SUBDURAL;  Surgeon: Julio Sicks, MD;  Location: [facility] OR;  Service: Neurosurgery;  Laterality: Left;   FRACTURE SURGERY  1990   right hip and femur   HERNIA REPAIR  07/2009   umbilical hernia/ got infected   JOINT REPLACEMENT Right 1990   right hip   TRACHEOSTOMY  08/1999   Had pneumoniaand was in a coma 10 days   Social History:  reports that he has been smoking cigarettes. He has a 30.00 pack-year smoking history. He has been exposed to tobacco smoke. He has never used smokeless tobacco. He reports that he does not currently use  drugs. He reports that he does not drink alcohol.  Allergies  Allergen Reactions   Cymbalta [Duloxetine Hcl] Other (See Comments)    Hyponatremia    Erythromycin Other (See Comments)    Unknown reation   Hctz [Hydrochlorothiazide] Other (See Comments)    Syncope   Inderal [Propranolol] Other (See Comments)    Syncope     Family History  Problem Relation Age of Onset   Heart disease Mother    Diabetes Father    Heart disease Brother    Hypertension  Neg Hx    Stroke Neg Hx     Prior to Admission medications   Medication Sig Start Date End Date Taking? Authorizing Provider  acetaminophen (TYLENOL) 325 MG tablet Take 2 tablets (650 mg total) by mouth every 4 (four) hours as needed for mild pain (temp > 100.5). 06/29/22   Val Eagle D, NP  aspirin 81 MG EC tablet Take 81 mg by mouth daily. Patient not taking: Reported on 07/26/2022    [provider]  atorvastatin (LIPITOR) 20 MG tablet Take 1 tablet by mouth once daily Patient taking differently: Take 20 mg by mouth daily. 06/01/22   Pricilla Riffle, MD  benztropine (COGENTIN) 1 MG tablet Take 1 mg by mouth at bedtime.    [provider]  busPIRone (BUSPAR) 5 MG tablet Take 5 mg by mouth 2 (two) times daily.  07/06/17   [provider]  cetirizine (ZYRTEC) 10 MG tablet Take 10 mg by mouth daily.    [provider]  citalopram (CELEXA) 20 MG tablet Take 20 mg by mouth daily.    [provider]  cloNIDine (CATAPRES) 0.1 MG tablet Take 1 tablet (0.1 mg total) by mouth 3 (three) times daily. 06/29/22   Val Eagle D, NP  diclofenac Sodium (VOLTAREN) 1 % GEL Apply 4 g topically 4 (four) times daily. 12/03/21   Dana Allan, MD  feeding supplement (ENSURE ENLIVE / ENSURE PLUS) LIQD Take 237 mLs by mouth 3 (three) times daily between meals. 06/29/22   Val Eagle D, NP  levETIRAcetam (KEPPRA) 750 MG tablet Take 2 tablets (1,500 mg total) by mouth 2 (two) times daily. 12/29/21   Glean Salvo, NP  midodrine (PROAMATINE) 5 MG tablet TAKE 2 TABLETS BY MOUTH ONCE DAILY AT  6  AM,  THEN  1  TABLET  AT  10  AM  AND  1  TABLET  AT  2  PM Patient taking differently: Take 5-10 mg by mouth See admin instructions. 10 mg at 0600, 5 mg at 1000, 5 mg at 1400 12/21/21   Pricilla Riffle, MD  Multiple Vitamin (MULTIVITAMIN) tablet Take 1 tablet by mouth daily.    [provider]  nicotine (NICODERM CQ - DOSED IN MG/24 HR) 7 mg/24hr patch Place 1 patch (7 mg  total) onto the skin daily. 06/30/22   Val Eagle D, NP  ondansetron (ZOFRAN) 8 MG tablet Take 1 tablet (8 mg total) by mouth every 8 (eight) hours as needed for nausea or vomiting. Patient not taking: Reported on 08/24/2022 04/21/22   Erick Alley, DO  ondansetron (ZOFRAN-ODT) 4 MG disintegrating tablet Take 1 tablet (4 mg total) by mouth every 8 (eight) hours as needed for nausea or vomiting. 08/10/22   Gloris Manchester, MD  potassium chloride SA (KLOR-CON) 20 MEQ tablet Take 20 mEq by mouth daily.    [provider]  pregabalin (LYRICA) 75 MG capsule Take 1 capsule (75 mg total) by  mouth 2 (two) times daily. 06/29/22   Viona Gilmore D, NP  QUEtiapine (SEROQUEL) 50 MG tablet Take 1 tablet (50 mg total) by mouth 2 (two) times daily. 06/29/22   Viona Gilmore D, NP  risperiDONE (RISPERDAL) 2 MG tablet Take 2 mg by mouth at bedtime.     [provider]  tamsulosin (FLOMAX) 0.4 MG CAPS capsule Take 0.4 mg by mouth daily after supper. 08/20/20   [provider]  thiamine 250 MG tablet Take 250 mg by mouth daily.    [provider]  traZODone (DESYREL) 150 MG tablet Take 150 mg by mouth at bedtime.  09/24/15   [provider]    Physical Exam: Vitals:   08/24/22 0430 08/24/22 0530 08/24/22 0600 08/24/22 0630  BP: (!) 119/90 134/81 (!) 155/136 (!) 155/70  Pulse: 91 91 84 89  Resp: (!) 24 18 (!) 21 20  Temp:    98.2 F (36.8 C)  TempSrc:      SpO2: 97% 98% 100% 100%  Weight:      Height:       Physical Exam Vitals and nursing note reviewed.  Constitutional:      General: He is awake.     Appearance: Normal appearance. He is normal weight. He is ill-appearing.     Comments: Chronically ill-appearing.  HENT:     Head: Normocephalic.     Nose: No rhinorrhea.     Mouth/Throat:     Mouth: Mucous membranes are dry.  Eyes:     General: No scleral icterus.    Pupils: Pupils are equal, round, and reactive to light.  Cardiovascular:     Rate and  Rhythm: Normal rate and regular rhythm.  Pulmonary:     Effort: Pulmonary effort is normal.     Breath sounds: Wheezing present. No rhonchi or rales.  Abdominal:     General: Bowel sounds are normal. There is no distension.     Palpations: Abdomen is soft.     Tenderness: There is no abdominal tenderness. There is no guarding.  Musculoskeletal:     Cervical back: Neck supple.     Right lower leg: No edema.     Left lower leg: No edema.  Skin:    General: Skin is warm and dry.  Neurological:     General: No focal deficit present.     Mental Status: He is alert and oriented to person, place, and time.  Psychiatric:        Mood and Affect: Mood normal.        Behavior: Behavior normal. Behavior is cooperative.    Data Reviewed:  There are no new results to review at this time.  Assessment and Plan: Principal Problem:   Orthostatic hypotension Inpatient/telemetry. Keep off antihypertensives. Continue midodrine 10 mg early morning. Then midodrine 5 mg p.o. 3 times daily. Continue gentle IV hydration. Monitor blood pressure closely. Looks physically deconditioned. Physical therapy evaluation in AM.  Active Problems:   Rhabdomyolysis Continue IV fluids. Monitor intake and output. Monitor renal function electrolytes. Follow total CK level in AM.    Elevated troponin No chest pain. Cardiovascularly stable. Secondary to elevated CK.    Hypomagnesemia Mag sulfate 2 g IVPB given    Hypokalemia Replacement given. Magnesium supplemented.   Follow potassium level.    Hyperlipidemia Continue atorvastatin 20 mg p.o. daily.    TOBACCO ABUSE Nicotine replacement therapy ordered. Tobacco cessation advised.    Epilepsy (HCC) Continue levetiracetam 1500 mg p.o. twice  daily.    OSA (obstructive sleep apnea) Continue BiPAP at bedtime.    Essential hypertension Off antihypertensives and on midodrine. Monitor blood pressure closely.    History of BPH Continue  tamsulosin 0.4 mg p.o. every evening.     Advance Care Planning:   Code Status: Full Code   Consults:   Family Communication:   Severity of Illness: The appropriate patient status for this patient is INPATIENT. Inpatient status is judged to be reasonable and necessary in order to provide the required intensity of service to ensure the patient's safety. The patient's presenting symptoms, physical exam findings, and initial radiographic and laboratory data in the context of their chronic comorbidities is felt to place them at high risk for further clinical deterioration. Furthermore, it is not anticipated that the patient will be medically stable for discharge from the hospital within 2 midnights of admission.   * I certify that at the point of admission it is my clinical judgment that the patient will require inpatient hospital care spanning beyond 2 midnights from the point of admission due to high intensity of service, high risk for further deterioration and high frequency of surveillance required.*  Author: Bobette Mo, MD 08/24/2022 7:25 AM  For on call review [email or web address].   This document was prepared using Dragon voice recognition software and may contain some unintended transcription errors.

## 2022-08-24 NOTE — ED Provider Notes (Signed)
[place] DEPT Provider Note  CSN: [id number] Arrival date & time: 08/24/22 0025  Chief Complaint(s) Weakness  HPI Jose Li is a 66 y.o. male with extensive past medical history listed below including hypertension, hyperlipidemia, seizures, bipolar, prior substance use disorder who recently had a fall resulting in subdural hematoma requiring craniotomy.  Patient also had COVID several weeks ago.  Presents today for lower extremity weakness upon standing.  He reports he feels lightheaded and his legs go weak.  This been ongoing for several days and gradually worsening.  He reports that he fell last night he was down for several hours.  He denies any nausea or vomiting.  No abdominal pain or diarrhea.  No chest pain or shortness of breath.  The history is provided by the patient.    Past Medical History Past Medical History:  Diagnosis Date   Acute kidney injury ([place]) 05/26/2018   Allergy    Anemia    Anxiety    Depression    Hyperlipidemia    Hypertension, essential, benign 08/02/2012   Orthostatic hypotension 10/09/2013   OSA (obstructive sleep apnea) 10/01/2015   Severe OSA with an AHI of 86/hr now on BiPAP at 23/19cm H2O   Seizures ([place])    none for last 6-84months   Substance abuse [facility])    Patient Active Problem List   Diagnosis Date Noted   Orthostatic hypotension 08/24/2022   Dizziness 07/26/2022   Tachycardia 07/26/2022   Pressure injury of skin 06/21/2022   Malnutrition of moderate degree 06/10/2022   Status post craniotomy 06/08/2022   COVID-19 virus infection 04/14/2022   Anterior chest wall pain 12/06/2021   Need for immunization against influenza 09/11/2020   Pain of paraspinal muscle 01/14/2020   History of BPH 09/24/2019   Chronic kidney disease (CKD), stage III (moderate) ([place]) 07/13/2019   Varicose veins of right lower extremity with complications 0000000   TBI (traumatic brain injury) ([place])    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 Authorizing Provider  acetaminophen (TYLENOL) 325 MG tablet Take 2 tablets (650 mg total) by mouth every 4 (four) hours as needed for mild pain (temp > 100.5). 06/29/22   Viona Gilmore D, NP  aspirin 81 MG EC tablet Take 81 mg by mouth daily. Patient not taking: Reported on 07/26/2022    [provider]  atorvastatin (LIPITOR) 20 MG tablet Take 1 tablet by mouth once daily Patient taking differently: Take 20 mg by mouth daily. 06/01/22   Fay Records, MD  benztropine (COGENTIN) 1 MG tablet Take 1 mg by mouth at bedtime.    [provider]  busPIRone (BUSPAR) 5 MG tablet Take 5 mg by mouth 2 (two) times daily.  07/06/17   [provider]  cetirizine (ZYRTEC) 10 MG tablet Take 10 mg by mouth daily.    [provider]  citalopram (CELEXA) 20 MG tablet Take 20 mg by mouth daily.    [provider]  cloNIDine (CATAPRES) 0.1 MG tablet Take 1 tablet (0.1 mg total) by mouth 3 (  three) times daily. 06/29/22   Viona Gilmore D, NP  diclofenac Sodium (VOLTAREN) 1 % GEL Apply 4 g topically 4 (four) times daily. 12/03/21   Carollee Leitz, MD  feeding supplement (ENSURE ENLIVE / ENSURE PLUS) LIQD Take 237 mLs by mouth 3 (three) times daily between meals. 06/29/22   Viona Gilmore D, NP  levETIRAcetam (KEPPRA) 750 MG tablet Take 2 tablets (1,500 mg total) by mouth 2 (two) times daily. 12/29/21   Suzzanne Cloud, NP  midodrine (PROAMATINE) 5 MG tablet TAKE 2 TABLETS BY MOUTH ONCE DAILY AT  6  AM,  THEN  1  TABLET  AT  10  AM  AND  1   TABLET  AT  2  PM Patient taking differently: Take 5-10 mg by mouth See admin instructions. 10 mg at 0600, 5 mg at 1000, 5 mg at 1400 12/21/21   Fay Records, MD  Multiple Vitamin (MULTIVITAMIN) tablet Take 1 tablet by mouth daily.    [provider]  nicotine (NICODERM CQ - DOSED IN MG/24 HR) 7 mg/24hr patch Place 1 patch (7 mg total) onto the skin daily. 06/30/22   Viona Gilmore D, NP  ondansetron (ZOFRAN) 8 MG tablet Take 1 tablet (8 mg total) by mouth every 8 (eight) hours as needed for nausea or vomiting. Patient not taking: Reported on 08/24/2022 04/21/22   Precious Gilding, DO  ondansetron (ZOFRAN-ODT) 4 MG disintegrating tablet Take 1 tablet (4 mg total) by mouth every 8 (eight) hours as needed for nausea or vomiting. 08/10/22   Godfrey Pick, MD  potassium chloride SA (KLOR-CON) 20 MEQ tablet Take 20 mEq by mouth daily.    [provider]  pregabalin (LYRICA) 75 MG capsule Take 1 capsule (75 mg total) by mouth 2 (two) times daily. 06/29/22   Viona Gilmore D, NP  QUEtiapine (SEROQUEL) 50 MG tablet Take 1 tablet (50 mg total) by mouth 2 (two) times daily. 06/29/22   Viona Gilmore D, NP  risperiDONE (RISPERDAL) 2 MG tablet Take 2 mg by mouth at bedtime.     [provider]  tamsulosin (FLOMAX) 0.4 MG CAPS capsule Take 0.4 mg by mouth daily after supper. 08/20/20   [provider]  thiamine 250 MG tablet Take 250 mg by mouth daily.    [provider]  traZODone (DESYREL) 150 MG tablet Take 150 mg by mouth at bedtime.  09/24/15   [provider]                                                                                                                                    Allergies Cymbalta [duloxetine hcl], Erythromycin, Hctz [hydrochlorothiazide], and Inderal [propranolol]  Review of Systems Review of Systems As noted in HPI  Physical Exam Vital Signs  I have reviewed the triage vital signs BP (!) 155/70   Pulse 89   Temp 98.2 F (36.8  C)   Resp  20   Ht 6' (1.829 m)   Wt 72.6 kg   SpO2 100%   BMI 21.70 kg/m   Physical Exam Vitals reviewed.  Constitutional:      General: He is not in acute distress.    Appearance: He is well-developed. He is not diaphoretic.  HENT:     Head: Normocephalic and atraumatic.      Nose: Nose normal.     Mouth/Throat:     Mouth: Mucous membranes are dry.  Eyes:     General: No scleral icterus.       Right eye: No discharge.        Left eye: No discharge.     Conjunctiva/sclera: Conjunctivae normal.     Pupils: Pupils are equal, round, and reactive to light.  Cardiovascular:     Rate and Rhythm: Normal rate and regular rhythm.     Heart sounds: No murmur heard.    No friction rub. No gallop.  Pulmonary:     Effort: Pulmonary effort is normal. No respiratory distress.     Breath sounds: Normal breath sounds. No stridor. No rales.  Abdominal:     General: There is no distension.     Palpations: Abdomen is soft.     Tenderness: There is no abdominal tenderness. There is no guarding or rebound.  Musculoskeletal:        General: No tenderness.     Cervical back: Normal range of motion and neck supple.     Right foot: Normal range of motion. No tenderness. Normal pulse.     Left foot: Normal range of motion. No tenderness. Normal pulse.  Skin:    General: Skin is warm and dry.     Findings: No erythema or rash.  Neurological:     Mental Status: He is alert and oriented to person, place, and time.     Comments: Motor System: Muscle Strength: 5/5 and symmetric in the upper and lower extremities. Muscle Tone: Tone and muscle bulk are normal in the upper and lower extremities.  Reflexes: No Clonus Coordination: No tremor.  Sensation: Intact to light touch. Gait: deferred      ED Results and Treatments Labs (all labs ordered are listed, but only abnormal results are displayed) Labs Reviewed  COMPREHENSIVE METABOLIC PANEL - Abnormal; Notable for the following components:       Result Value   Potassium 2.3 (*)    Glucose, Bld 103 (*)    BUN 7 (*)    Creatinine, Ser 1.25 (*)    AST 42 (*)    All other components within normal limits  CBC WITH DIFFERENTIAL/PLATELET - Abnormal; Notable for the following components:   WBC 14.3 (*)    RDW 16.0 (*)    Platelets 475 (*)    Neutro Abs 10.4 (*)    Monocytes Absolute 1.3 (*)    Abs Immature Granulocytes 0.08 (*)    All other components within normal limits  LIPASE, BLOOD - Abnormal; Notable for the following components:   Lipase 64 (*)    All other components within normal limits  CK - Abnormal; Notable for the following components:   Total CK 1,613 (*)    All other components within normal limits  I-STAT CHEM 8, ED - Abnormal; Notable for the following components:   Potassium 3.0 (*)    BUN 5 (*)    Glucose, Bld 102 (*)    Calcium, Ion 1.12 (*)    Hemoglobin 12.9 (*)  HCT 38.0 (*)    All other components within normal limits  TROPONIN I (HIGH SENSITIVITY) - Abnormal; Notable for the following components:   Troponin I (High Sensitivity) 33 (*)    All other components within normal limits  TROPONIN I (HIGH SENSITIVITY) - Abnormal; Notable for the following components:   Troponin I (High Sensitivity) 25 (*)    All other components within normal limits  LACTIC ACID, PLASMA  LACTIC ACID, PLASMA  PROTIME-INR  URINALYSIS, ROUTINE W REFLEX MICROSCOPIC  MAGNESIUM                                                                                                                         EKG  EKG Interpretation  Date/Time:  Tuesday August 24 2022 01:04:54 EDT Ventricular Rate:  87 PR Interval:  156 QRS Duration: 83 QT Interval:  368 QTC Calculation: 443 R Axis:   67 Text Interpretation: Sinus rhythm Ventricular premature complex Borderline T wave abnormalities Otherwise no significant change Confirmed by Addison Lank [phone]) on 08/24/2022 3:58:26 AM       Radiology CT Head Wo Contrast  Result Date:  08/24/2022 CLINICAL DATA:  Head trauma, moderate-severe; Neck trauma, midline tenderness (Age 95-66y). Altered mental status, ataxia EXAM: CT HEAD WITHOUT CONTRAST CT CERVICAL SPINE WITHOUT CONTRAST TECHNIQUE: Multidetector CT imaging of the head and cervical spine was performed following the standard protocol without intravenous contrast. Multiplanar CT image reconstructions of the cervical spine were also generated. RADIATION DOSE REDUCTION: This exam was performed according to the departmental dose-optimization program which includes automated exposure control, adjustment of the mA and/or kV according to patient size and/or use of iterative reconstruction technique. COMPARISON:  06/12/2022 FINDINGS: CT HEAD FINDINGS Brain: Left frontotemporal craniotomy again noted. Previously noted pneumocephalus and residual left subdural hematoma have resolved. Mild dural thickening subjacent to the craniotomy flap. No acute intracranial hemorrhage or infarct. Focal encephalomalacia noted within the left frontal lobe related to prior trauma and cortical contusion. Stable region of cortical encephalomalacia within the right temporoparietal cortex. No abnormal intra or extra-axial mass lesion or fluid collection. No abnormal mass effect or midline shift. Ventricular size is normal. Cerebellum is unremarkable. Vascular: No hyperdense vessel or unexpected calcification. Skull: No acute fracture. Sinuses/Orbits: No acute finding. Other: Mastoid air cells and middle ear cavities are clear CT CERVICAL SPINE FINDINGS Alignment: Normal. Skull base and vertebrae: Craniocervical alignment is normal. The atlantodental interval is not widened. No acute fracture of the cervical spine. Vertebral body height is preserved. Ankylosis of C5-C7 vertebral bodies again noted. Soft tissues and spinal canal: No prevertebral fluid or swelling. No visible canal hematoma. Disc levels: Moderate to severe intervertebral disc space narrowing and endplate  remodeling at C7-T1 again noted in keeping with changes of advanced degenerative disc disease. Milder degenerative changes noted at C3-C5 and T1-2. Prevertebral soft tissues are not thickened on sagittal reformats. Spinal canal is widely patent. Left hemilaminectomy has been performed at C5-6 and C6-7 multilevel facet arthrosis results in  mild to moderate multilevel neuroforaminal narrowing, most severe bilaterally at C4-5 and C5-6. Upper chest: Negative. Other: None IMPRESSION: 1. No acute intracranial abnormality. No calvarial fracture. 2. No acute fracture or listhesis of the cervical spine. Electronically Signed   By: Fidela Salisbury M.D.   On: 08/24/2022 02:06   CT Cervical Spine Wo Contrast  Result Date: 08/24/2022 CLINICAL DATA:  Head trauma, moderate-severe; Neck trauma, midline tenderness (Age 34-64y). Altered mental status, ataxia EXAM: CT HEAD WITHOUT CONTRAST CT CERVICAL SPINE WITHOUT CONTRAST TECHNIQUE: Multidetector CT imaging of the head and cervical spine was performed following the standard protocol without intravenous contrast. Multiplanar CT image reconstructions of the cervical spine were also generated. RADIATION DOSE REDUCTION: This exam was performed according to the departmental dose-optimization program which includes automated exposure control, adjustment of the mA and/or kV according to patient size and/or use of iterative reconstruction technique. COMPARISON:  06/12/2022 FINDINGS: CT HEAD FINDINGS Brain: Left frontotemporal craniotomy again noted. Previously noted pneumocephalus and residual left subdural hematoma have resolved. Mild dural thickening subjacent to the craniotomy flap. No acute intracranial hemorrhage or infarct. Focal encephalomalacia noted within the left frontal lobe related to prior trauma and cortical contusion. Stable region of cortical encephalomalacia within the right temporoparietal cortex. No abnormal intra or extra-axial mass lesion or fluid collection. No  abnormal mass effect or midline shift. Ventricular size is normal. Cerebellum is unremarkable. Vascular: No hyperdense vessel or unexpected calcification. Skull: No acute fracture. Sinuses/Orbits: No acute finding. Other: Mastoid air cells and middle ear cavities are clear CT CERVICAL SPINE FINDINGS Alignment: Normal. Skull base and vertebrae: Craniocervical alignment is normal. The atlantodental interval is not widened. No acute fracture of the cervical spine. Vertebral body height is preserved. Ankylosis of C5-C7 vertebral bodies again noted. Soft tissues and spinal canal: No prevertebral fluid or swelling. No visible canal hematoma. Disc levels: Moderate to severe intervertebral disc space narrowing and endplate remodeling at [id number] again noted in keeping with changes of advanced degenerative disc disease. Milder degenerative changes noted at C3-C5 and T1-2. Prevertebral soft tissues are not thickened on sagittal reformats. Spinal canal is widely patent. Left hemilaminectomy has been performed at C5-6 and C6-7 multilevel facet arthrosis results in mild to moderate multilevel neuroforaminal narrowing, most severe bilaterally at C4-5 and C5-6. Upper chest: Negative. Other: None IMPRESSION: 1. No acute intracranial abnormality. No calvarial fracture. 2. No acute fracture or listhesis of the cervical spine. Electronically Signed   By: Fidela Salisbury M.D.   On: 08/24/2022 02:06   DG Chest Port 1 View  Result Date: 08/24/2022 CLINICAL DATA:  Sepsis EXAM: PORTABLE CHEST 1 VIEW COMPARISON:  08/10/2022 FINDINGS: Minimal left basilar atelectasis or scarring. Lungs are otherwise clear. No pneumothorax or pleural effusion. Cardiac size within normal limits. Pulmonary vascularity is normal. Multiple remote left rib fractures again noted. No acute bone abnormality. IMPRESSION: Minimal left basilar atelectasis or scarring. Electronically Signed   By: Fidela Salisbury M.D.   On: 08/24/2022 01:23    Medications Ordered in  ED Medications  acetaminophen (TYLENOL) tablet 650 mg (has no administration in time range)    Or  acetaminophen (TYLENOL) suppository 650 mg (has no administration in time range)  0.9 % NaCl with KCl 40 mEq / L  infusion (has no administration in time range)  sodium chloride 0.9 % bolus 1,000 mL (0 mLs Intravenous Stopped 08/24/22 0330)  potassium chloride 10 mEq in 100 mL IVPB (0 mEq Intravenous Stopped 08/24/22 0528)  potassium chloride SA (KLOR-CON M) CR  tablet 40 mEq (40 mEq Oral Given 08/24/22 0413)  0.9 %  sodium chloride infusion (0 mLs Intravenous Stopped 08/24/22 0731)  sodium chloride 0.9 % bolus 1,000 mL (0 mLs Intravenous Stopped 08/24/22 0636)  sodium chloride 0.9 % bolus 1,000 mL (0 mLs Intravenous Stopped 08/24/22 0731)                                                                                                                                     Procedures .1-3 Lead EKG Interpretation  Performed by: Fatima Blank, MD Authorized by: Fatima Blank, MD     Interpretation: normal     ECG rate:  90   ECG rate assessment: normal     Rhythm: sinus rhythm     Ectopy: none     Conduction: normal   .Critical Care  Performed by: Fatima Blank, MD Authorized by: Fatima Blank, MD   Critical care provider statement:    Critical care time (minutes):  45   Critical care time was exclusive of:  Separately billable procedures and treating other patients   Critical care was necessary to treat or prevent imminent or life-threatening deterioration of the following conditions:  Dehydration   Critical care was time spent personally by me on the following activities:  Development of treatment plan with patient or surrogate, discussions with consultants, evaluation of patient's response to treatment, examination of patient, obtaining history from patient or surrogate, review of old charts, re-evaluation of patient's condition, pulse oximetry, ordering  and review of radiographic studies, ordering and review of laboratory studies and ordering and performing treatments and interventions   Care discussed with: admitting provider     (including critical care time)  Medical Decision Making / ED Course   Medical Decision Making Amount and/or Complexity of Data Reviewed Labs: ordered. Radiology: ordered. ECG/medicine tests: ordered.  Risk Prescription drug management. Decision regarding hospitalization.    Patient presents with what appears to be orthostasis.  Orthostatics were positive.  He was provided with IV fluids.  Work-up obtained to assess for severe anemia, any electrolyte/metabolic derangements, renal sufficiency, rhabdo.  We will rule out infectious process.  Given the fall, we will obtain CT head and cervical spine to rule out any injuries.  CBC with stable leukocytosis.  No anemia. CMP with hypokalemia.  No other significant electrolyte derangements.  Mild renal insufficiency. CK is 1600 Lactic acid negative Troponins flat UA negative CT head and cervical spine negative for any acute injuries. X-ray without evidence of pneumonia, pneumothorax, pulmonary edema.   After initial round of IV fluids, patient is still symptomatic and orthostatic with BPs dropping from 140s down to 90s. He was given additional IV fluid boluses.  Also repleted potassium with IV and oral repletion.  Case discussed with Dr. Velia Meyer from hospitalist service who agreed to admit patient for further work-up and management.      Final Clinical Impression(s) /  ED Diagnoses Final diagnoses:  Orthostatic hypotension           This chart was dictated using voice recognition software.  Despite best efforts to proofread,  errors can occur which can change the documentation meaning.    Fatima Blank, MD 08/24/22 [phone]

## 2022-08-25 ENCOUNTER — Other Ambulatory Visit: Payer: Self-pay

## 2022-08-25 LAB — COMPREHENSIVE METABOLIC PANEL
ALT: 19 U/L (ref 0–44)
AST: 26 U/L (ref 15–41)
Albumin: 2.7 g/dL — ABNORMAL LOW (ref 3.5–5.0)
Alkaline Phosphatase: 75 U/L (ref 38–126)
Anion gap: 6 (ref 5–15)
BUN: 8 mg/dL (ref 8–23)
CO2: 20 mmol/L — ABNORMAL LOW (ref 22–32)
Calcium: 7.9 mg/dL — ABNORMAL LOW (ref 8.9–10.3)
Chloride: 112 mmol/L — ABNORMAL HIGH (ref 98–111)
Creatinine, Ser: 0.78 mg/dL (ref 0.61–1.24)
GFR, Estimated: >60 mL/min (ref >60)
Glucose, Bld: 90 mg/dL (ref 70–99)
Potassium: 3.4 mmol/L — ABNORMAL LOW (ref 3.5–5.1)
Sodium: 138 mmol/L (ref 135–145)
Total Bilirubin: 0.4 mg/dL (ref 0.3–1.2)
Total Protein: 5.2 g/dL — ABNORMAL LOW (ref 6.5–8.1)

## 2022-08-25 LAB — CBC
HCT: 34.6 % — ABNORMAL LOW (ref 39.0–52.0)
Hemoglobin: 11.2 g/dL — ABNORMAL LOW (ref 13.0–17.0)
MCH: 27.3 pg (ref 26.0–34.0)
MCHC: 32.4 g/dL (ref 30.0–36.0)
MCV: 84.4 fL (ref 80.0–100.0)
Platelets: 330 10*3/uL (ref 150–400)
RBC: 4.1 MIL/uL — ABNORMAL LOW (ref 4.22–5.81)
RDW: 16 % — ABNORMAL HIGH (ref 11.5–15.5)
WBC: 9.4 10*3/uL (ref 4.0–10.5)
nRBC: 0 % (ref 0.0–0.2)

## 2022-08-25 LAB — CK: Total CK: 827 U/L — ABNORMAL HIGH (ref 49–397)

## 2022-08-25 LAB — MAGNESIUM: Magnesium: 1.6 mg/dL — ABNORMAL LOW (ref 1.7–2.4)

## 2022-08-25 MED ORDER — PREGABALIN 75 MG PO CAPS: 75.0000 mg | ORAL_CAPSULE | Freq: Two times a day (BID) | ORAL | 1 refills | Status: DC

## 2022-08-25 MED ORDER — LACTATED RINGERS IV SOLN: INTRAVENOUS | Status: DC

## 2022-08-25 MED ORDER — POTASSIUM CHLORIDE CRYS ER 20 MEQ PO TBCR
40.0000 meq | EXTENDED_RELEASE_TABLET | ORAL | Status: AC
  Administered 2022-08-25 (×2): 40 meq via ORAL
  Filled 2022-08-25 (×2): qty 2

## 2022-08-25 MED ORDER — MAGNESIUM SULFATE 2 GM/50ML IV SOLN
2.0000 g | Freq: Once | INTRAVENOUS | Status: AC
  Administered 2022-08-25: 2 g via INTRAVENOUS
  Filled 2022-08-25: qty 50

## 2022-08-25 MED ORDER — BOOST PLUS PO LIQD
237.0000 mL | Freq: Three times a day (TID) | ORAL | Status: DC
  Administered 2022-08-25 – 2022-08-27 (×6): 237 mL via ORAL
  Filled 2022-08-25 (×6): qty 237

## 2022-08-25 MED ORDER — ADULT MULTIVITAMIN W/MINERALS CH
1.0000 | ORAL_TABLET | Freq: Every day | ORAL | Status: DC
  Administered 2022-08-26 – 2022-08-27 (×2): 1 via ORAL
  Filled 2022-08-25 (×3): qty 1

## 2022-08-25 NOTE — Progress Notes (Signed)
Patient tolerated being in chair for one hour after PT got him in chair.

## 2022-08-25 NOTE — TOC Initial Note (Signed)
Transition of Care [facility]) - Initial/Assessment Note    Patient Details  Name: Jose Li MRN: [id number] Date of Birth: 1955-12-05  Transition of Care [facility]) CM/SW Contact:    Vassie Moselle, LCSW Phone Number: 08/25/2022, 1:52 PM  Clinical Narrative:                 Met with pt to confirm plan for SNF placement. Pt was recently at [facility] at the end of August and reports being there for a "couple of weeks." CSW discussed possibility that there may be limited coverage with insurance as pt has used SNF coverage within the last 2 months.  CSW referred pt out for SNF placement and currently awaiting bed offers.  Expected Discharge Plan: Skilled Nursing Facility Barriers to Discharge: No Barriers Identified   Patient Goals and CMS Choice Patient states their goals for this hospitalization and ongoing recovery are:: To go to SNF CMS Medicare.gov Compare Post Acute Care list provided to:: Patient Choice offered to / list presented to : Patient  Expected Discharge Plan and Services Expected Discharge Plan: [place] In-house Referral: NA Discharge Planning Services: CM Consult Post Acute Care Choice: [place] Living arrangements for the past 2 months: Single Family Home                 DME Arranged: N/A DME Agency: NA                  Prior Living Arrangements/Services Living arrangements for the past 2 months: Single Family Home Lives with:: Self Patient language and need for interpreter reviewed:: Yes Do you feel safe going back to the place where you live?: Yes      Need for Family Participation in Patient Care: No (Comment) Care giver support system in place?: No (comment) Current home services: DME Criminal Activity/Legal Involvement Pertinent to Current Situation/Hospitalization: No - Comment as needed  Activities of Daily Living Home Assistive Devices/Equipment: Eyeglasses ADL Screening (condition at time of  admission) Patient's cognitive ability adequate to safely complete daily activities?: Yes Is the patient deaf or have difficulty hearing?: No Does the patient have difficulty seeing, even when wearing glasses/contacts?: No Does the patient have difficulty concentrating, remembering, or making decisions?: Yes Patient able to express need for assistance with ADLs?: Yes Does the patient have difficulty dressing or bathing?: No Independently performs ADLs?: No Communication: Independent Dressing (OT): Independent Grooming: Independent Feeding: Independent Bathing: Independent Toileting: Needs assistance Is this a change from baseline?: Pre-admission baseline In/Out Bed: Needs assistance Is this a change from baseline?: Pre-admission baseline Walks in Home: Dependent Is this a change from baseline?: Pre-admission baseline Does the patient have difficulty walking or climbing stairs?: Yes Weakness of Legs: Both Weakness of Arms/Hands: Both  Permission Sought/Granted Permission sought to share information with : Facility Art therapist granted to share information with : Yes, Verbal Permission Granted              Emotional Assessment Appearance:: Appears stated age Attitude/Demeanor/Rapport: Engaged Affect (typically observed): Accepting Orientation: : Oriented to Self, Oriented to Place, Oriented to  Time, Oriented to Situation Alcohol / Substance Use: Not Applicable Psych Involvement: No (comment)  Admission diagnosis:  Orthostatic hypotension [I95.1] Patient Active Problem List   Diagnosis Date Noted   Orthostatic hypotension 08/24/2022   Rhabdomyolysis 08/24/2022   Hypomagnesemia 08/24/2022   Elevated troponin 08/24/2022   Dizziness 07/26/2022   Tachycardia 07/26/2022   Pressure injury of skin 06/21/2022  Malnutrition of moderate degree 06/10/2022   Status post craniotomy 06/08/2022   COVID-19 virus infection 04/14/2022   Anterior chest wall pain  12/06/2021   Need for immunization against influenza 09/11/2020   Pain of paraspinal muscle 01/14/2020   History of BPH 09/24/2019   Chronic kidney disease (CKD), stage III (moderate) ([place]) 07/13/2019   Varicose veins of right lower extremity with complications 98/92/1194   TBI (traumatic brain injury) ([place])    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 [place] [address] Phone: [phone] Fax: [place] [address] [place] [address] Phone: [phone] Fax: [phone]     Social Determinants of Health (SDOH) Interventions    Readmission Risk Interventions    08/25/2022    1:50 PM  Readmission Risk Prevention Plan  Transportation Screening Complete  PCP or Specialist Appt within 3-5 Days Complete  HRI or [place] Complete  Social Work Consult for [place] Planning/Counseling Complete  Palliative Care Screening Not Applicable  Medication Review [employer]) Complete

## 2022-08-25 NOTE — Telephone Encounter (Signed)
Paper rx has been sent to pharmacy ( [facility]) via fax, confirmation received.

## 2022-08-25 NOTE — Plan of Care (Signed)

## 2022-08-25 NOTE — Progress Notes (Addendum)
PROGRESS NOTE    Jose SCHMALZRIED  [id number] DOB: 1956-06-02 DOA: 08/24/2022 PCP: Holley Bouche, MD    Brief Narrative:   Jose Li is a 66 y.o. male with past medical history significant for essential hypertension, orthostatic hypotension, hyperlipidemia, anxiety/depression, anemia, OSA on BiPAP, seizure disorder secondary to EtOH withdrawal, history of subs abuse, seasonal allergies, recent hospitalization at [facility] 7/24 - 8/20 for left-sided subdural hematoma s/p craniotomy complicated by hospital-acquired delirium who presented to [facility] ED on 10/10 with generalized weakness, multiple falls with progressive difficulty ambulating over the last 7 days.  Patient reports he was unable to get up from the floor, denies loss of consciousness; and did not report striking his head.  In the ED, temperature 98.0 F, HR 90, RR 18, BP 130/80, SPO2 100% on room air.  Sodium 135, potassium 2.3, chloride 106, CO2 22, glucose 103, BUN 7, creatinine 1.25.  Lipase 64.  AST 42, ALT 25, total bilirubin 0.6.  CK 1613.  High sensitive troponin 33>25.  WBC 14.3, hemoglobin 14.3, platelets 475.  INR 0.9.  Urinalysis unrevealing.  CT head/C-spine with no acute intracranial abnormality, no calvarial fracture, no acute fracture or listhesis of the cervical spine.  Chest x-ray with minimal left basilar atelectasis versus scarring.  Patient received 3 L LR bolus, potassium supplementation and magnesium IV PB.  Hospitalist service consulted for admission for further evaluation and treatment of generalized weakness, mild rhabdomyolysis, orthostatic hypotension  Assessment & Plan:   Rhabdomyolysis, mild Patient presenting to the ED following multiple falls and generalized weakness.  Reports has not been unable to ambulate over the last several days.  CK level elevated 1613 on admission. --CK [id number] --Continue IV fluid hydration --CK daily --PT/OT evaluation  Orthostatic hypotension Patient was notably orthostatic upon  ED presentation, likely secondary to vomit up lesion in the setting of dehydration as he has been unable to mobilize well over the last week.  Patient received 3 L LR bolus in the ED. --Repeat orthostatic vital signs normal this morning --Continue midodrine 10 mg every morning, 5 mg at 1000am and 200pm --Continue to encourage increased oral intake --Orthostatic vital signs daily  Hypokalemia Hypomagnesemia Potassium 2.3, magnesium 1.2 on admission.  Repleted. -- Repeat electrolytes in a.m.  Elevated troponin Troponin mildly elevated on admission, 33 followed by 25.  EKG without dynamic changes.  Etiology likely secondary to type II demand ischemia in the setting of dehydration/rhabdomyolysis as above. -- Continue monitor on telemetry  Dysphagia RN reports choking episodes x2 today. -- SLP evaluation  Hx epilepsy -- Keppra 1500 mg p.o. twice daily  Hyperlipidemia -- Atorvastatin 20 g p.o. daily  Tobacco use disorder Counseled on need for complete tobacco cessation. -- Continue patch  Hx EtoH abuse Patient reports no alcohol use over the last week.  Counseled on need for complete cessation.  Essential hypertension Now off of antihypertensives due to orthostatic hypotension, currently on midodrine. --Continue monitor BP closely, orthostatics daily  History of BPH -- Tamsulosin 0.4 mg p.o. nightly  Weakness/debility/deconditioning: -- PT/OT evaluation   DVT prophylaxis: SCDs Start: 08/24/22 0649    Code Status: Full Code Family Communication: No family present at bedside this morning  Disposition Plan:  Level of care: Telemetry Status is: Inpatient Remains inpatient appropriate because: IV fluids, pending therapy evaluation    Consultants:  None  Procedures:  None  Antimicrobials:  None   Subjective: Seen examined bedside, resting calmly.  Lying in bed.  No specific complaints this morning other than  continued mild weakness/myalgias.  Awaiting PT  evaluation.  Repeat orthostatic vital signs unrevealing this morning.  No other questions or concerns at this time.  Denies headache, no dizziness, no chest pain, no palpitations, no shortness of breath, no abdominal pain, no focal weakness, no fever/chills/night sweats, no nausea/vomiting/diarrhea, no cough/congestion, n paresthesias.  No acute events overnight per nursing staff.  Objective: Vitals:   08/24/22 1920 08/24/22 2255 08/25/22 0307 08/25/22 0500  BP: (!) 145/71 (!) 186/74 (!) 134/113 (!) 145/71  Pulse: 94 92 78   Resp: 20 20 18    Temp: 98.9 F (37.2 C) 98.9 F (37.2 C) 98.2 F (36.8 C)   TempSrc: Oral Oral Oral   SpO2: 99% 98% 100%   Weight:    75.2 kg  Height:        Intake/Output Summary (Last 24 hours) at 08/25/2022 1219 Last data filed at 08/25/2022 1100 Gross per 24 hour  Intake 1907.59 ml  Output 600 ml  Net 1307.59 ml   Filed Weights   08/24/22 0041 08/25/22 0500  Weight: 72.6 kg 75.2 kg    Examination:  Physical Exam: GEN: NAD, alert and oriented x 3, chronically ill appearance, appears older than stated age HEENT: NCAT, PERRL, EOMI, sclera clear, MMM PULM: CTAB w/o wheezes/crackles, normal respiratory effort, on room air CV: RRR w/o M/G/R GI: abd soft, NTND, NABS, no R/G/M MSK: no peripheral edema, moves all extremities independently NEURO: CN II-XII intact, no focal deficits, sensation to light touch intact PSYCH: normal mood/affect Integumentary: dry/intact, no rashes or wounds    Data Reviewed: I have personally reviewed following labs and imaging studies  CBC: Recent Labs  Lab 08/24/22 0045 08/24/22 0126 08/25/22 0500  WBC 14.3*  --  9.4  NEUTROABS 10.4*  --   --   HGB 14.3 12.9* 11.2*  HCT 42.7 38.0* 34.6*  MCV 82.0  --  84.4  PLT 475*  --  [id number]   Basic Metabolic Panel: Recent Labs  Lab 08/24/22 0045 08/24/22 0126 08/24/22 0345 08/25/22 0500  NA 135 138  --  138  K 2.3* 3.0*  --  3.4*  CL 106 102  --  112*  CO2 22  --   --   20*  GLUCOSE 103* 102*  --  90  BUN 7* 5*  --  8  CREATININE 1.25* 1.10  --  0.78  CALCIUM 9.2  --   --  7.9*  MG  --   --  1.2* 1.6*   GFR: Estimated Creatinine Clearance: 99.2 mL/min (by C-G formula based on SCr of 0.78 mg/dL). Liver Function Tests: Recent Labs  Lab 08/24/22 0045 08/25/22 0500  AST 42* 26  ALT 25 19  ALKPHOS 113 75  BILITOT 0.6 0.4  PROT 7.3 5.2*  ALBUMIN 4.2 2.7*   Recent Labs  Lab 08/24/22 0045  LIPASE 64*   No results for input(s): "AMMONIA" in the last 168 hours. Coagulation Profile: Recent Labs  Lab 08/24/22 0045  INR 0.9   Cardiac Enzymes: Recent Labs  Lab 08/24/22 0045 08/25/22 0500  CKTOTAL 1,613* 827*   BNP (last 3 results) No results for input(s): "PROBNP" in the last 8760 hours. HbA1C: No results for input(s): "HGBA1C" in the last 72 hours. CBG: No results for input(s): "GLUCAP" in the last 168 hours. Lipid Profile: No results for input(s): "CHOL", "HDL", "LDLCALC", "TRIG", "CHOLHDL", "LDLDIRECT" in the last 72 hours. Thyroid Function Tests: No results for input(s): "TSH", "T4TOTAL", "FREET4", "T3FREE", "THYROIDAB" in the last 72  hours. Anemia Panel: No results for input(s): "VITAMINB12", "FOLATE", "FERRITIN", "TIBC", "IRON", "RETICCTPCT" in the last 72 hours. Sepsis Labs: Recent Labs  Lab 08/24/22 0115 08/24/22 0315  LATICACIDVEN 1.0 1.4    No results found for this or any previous visit (from the past 240 hour(s)).       Radiology Studies: CT Head Wo Contrast  Result Date: 08/24/2022 CLINICAL DATA:  Head trauma, moderate-severe; Neck trauma, midline tenderness (Age 27-64y). Altered mental status, ataxia EXAM: CT HEAD WITHOUT CONTRAST CT CERVICAL SPINE WITHOUT CONTRAST TECHNIQUE: Multidetector CT imaging of the head and cervical spine was performed following the standard protocol without intravenous contrast. Multiplanar CT image reconstructions of the cervical spine were also generated. RADIATION DOSE REDUCTION:  This exam was performed according to the departmental dose-optimization program which includes automated exposure control, adjustment of the mA and/or kV according to patient size and/or use of iterative reconstruction technique. COMPARISON:  06/12/2022 FINDINGS: CT HEAD FINDINGS Brain: Left frontotemporal craniotomy again noted. Previously noted pneumocephalus and residual left subdural hematoma have resolved. Mild dural thickening subjacent to the craniotomy flap. No acute intracranial hemorrhage or infarct. Focal encephalomalacia noted within the left frontal lobe related to prior trauma and cortical contusion. Stable region of cortical encephalomalacia within the right temporoparietal cortex. No abnormal intra or extra-axial mass lesion or fluid collection. No abnormal mass effect or midline shift. Ventricular size is normal. Cerebellum is unremarkable. Vascular: No hyperdense vessel or unexpected calcification. Skull: No acute fracture. Sinuses/Orbits: No acute finding. Other: Mastoid air cells and middle ear cavities are clear CT CERVICAL SPINE FINDINGS Alignment: Normal. Skull base and vertebrae: Craniocervical alignment is normal. The atlantodental interval is not widened. No acute fracture of the cervical spine. Vertebral body height is preserved. Ankylosis of C5-C7 vertebral bodies again noted. Soft tissues and spinal canal: No prevertebral fluid or swelling. No visible canal hematoma. Disc levels: Moderate to severe intervertebral disc space narrowing and endplate remodeling at C7-T1 again noted in keeping with changes of advanced degenerative disc disease. Milder degenerative changes noted at C3-C5 and T1-2. Prevertebral soft tissues are not thickened on sagittal reformats. Spinal canal is widely patent. Left hemilaminectomy has been performed at C5-6 and C6-7 multilevel facet arthrosis results in mild to moderate multilevel neuroforaminal narrowing, most severe bilaterally at C4-5 and C5-6. Upper chest:  Negative. Other: None IMPRESSION: 1. No acute intracranial abnormality. No calvarial fracture. 2. No acute fracture or listhesis of the cervical spine. Electronically Signed   By: Helyn NumbersAshesh  Parikh M.D.   On: 08/24/2022 02:06   CT Cervical Spine Wo Contrast  Result Date: 08/24/2022 CLINICAL DATA:  Head trauma, moderate-severe; Neck trauma, midline tenderness (Age 27-64y). Altered mental status, ataxia EXAM: CT HEAD WITHOUT CONTRAST CT CERVICAL SPINE WITHOUT CONTRAST TECHNIQUE: Multidetector CT imaging of the head and cervical spine was performed following the standard protocol without intravenous contrast. Multiplanar CT image reconstructions of the cervical spine were also generated. RADIATION DOSE REDUCTION: This exam was performed according to the departmental dose-optimization program which includes automated exposure control, adjustment of the mA and/or kV according to patient size and/or use of iterative reconstruction technique. COMPARISON:  06/12/2022 FINDINGS: CT HEAD FINDINGS Brain: Left frontotemporal craniotomy again noted. Previously noted pneumocephalus and residual left subdural hematoma have resolved. Mild dural thickening subjacent to the craniotomy flap. No acute intracranial hemorrhage or infarct. Focal encephalomalacia noted within the left frontal lobe related to prior trauma and cortical contusion. Stable region of cortical encephalomalacia within the right temporoparietal cortex. No abnormal intra or  extra-axial mass lesion or fluid collection. No abnormal mass effect or midline shift. Ventricular size is normal. Cerebellum is unremarkable. Vascular: No hyperdense vessel or unexpected calcification. Skull: No acute fracture. Sinuses/Orbits: No acute finding. Other: Mastoid air cells and middle ear cavities are clear CT CERVICAL SPINE FINDINGS Alignment: Normal. Skull base and vertebrae: Craniocervical alignment is normal. The atlantodental interval is not widened. No acute fracture of the  cervical spine. Vertebral body height is preserved. Ankylosis of C5-C7 vertebral bodies again noted. Soft tissues and spinal canal: No prevertebral fluid or swelling. No visible canal hematoma. Disc levels: Moderate to severe intervertebral disc space narrowing and endplate remodeling at X3-G1 again noted in keeping with changes of advanced degenerative disc disease. Milder degenerative changes noted at C3-C5 and T1-2. Prevertebral soft tissues are not thickened on sagittal reformats. Spinal canal is widely patent. Left hemilaminectomy has been performed at C5-6 and C6-7 multilevel facet arthrosis results in mild to moderate multilevel neuroforaminal narrowing, most severe bilaterally at C4-5 and C5-6. Upper chest: Negative. Other: None IMPRESSION: 1. No acute intracranial abnormality. No calvarial fracture. 2. No acute fracture or listhesis of the cervical spine. Electronically Signed   By: Fidela Salisbury M.D.   On: 08/24/2022 02:06   DG Chest Port 1 View  Result Date: 08/24/2022 CLINICAL DATA:  Sepsis EXAM: PORTABLE CHEST 1 VIEW COMPARISON:  08/10/2022 FINDINGS: Minimal left basilar atelectasis or scarring. Lungs are otherwise clear. No pneumothorax or pleural effusion. Cardiac size within normal limits. Pulmonary vascularity is normal. Multiple remote left rib fractures again noted. No acute bone abnormality. IMPRESSION: Minimal left basilar atelectasis or scarring. Electronically Signed   By: Fidela Salisbury M.D.   On: 08/24/2022 01:23        Scheduled Meds:  aspirin EC  81 mg Oral Daily   atorvastatin  20 mg Oral Daily   benztropine  1 mg Oral QHS   busPIRone  5 mg Oral BID   citalopram  20 mg Oral Daily   levETIRAcetam  1,500 mg Oral BID   loratadine  10 mg Oral Daily   midodrine  10 mg Oral Q0600   midodrine  5 mg Oral Daily   midodrine  5 mg Oral Q24H   nicotine  21 mg Transdermal Daily   potassium chloride  40 mEq Oral Q3H   pregabalin  75 mg Oral BID   risperiDONE  2 mg Oral QHS    tamsulosin  0.4 mg Oral QPC supper   thiamine  250 mg Oral Daily   traZODone  150 mg Oral QHS   Continuous Infusions:  lactated ringers 100 mL/hr at 08/25/22 1001     LOS: 1 day    Time spent: 51 minutes spent on chart review, discussion with nursing staff, consultants, updating family and interview/physical exam; more than 50% of that time was spent in counseling and/or coordination of care.    Dicky Boer J [place] DO [facility] Available via Epic secure chat 7am-7pm After these hours, please refer to coverage provider listed on [email or web address] 08/25/2022, 12:19 PM

## 2022-08-25 NOTE — Progress Notes (Signed)
Initial Nutrition Assessment  DOCUMENTATION CODES:   Non-severe (moderate) malnutrition in context of chronic illness  INTERVENTION:   -Boost Plus TID- Each supplement provides 360kcal and 14g protein.     -Multivitamin with minerals daily  NUTRITION DIAGNOSIS:   Moderate Malnutrition related to chronic illness (substance abuse) as evidenced by moderate fat depletion, severe muscle depletion.  GOAL:   Patient will meet greater than or equal to 90% of their needs  MONITOR:   PO intake, Supplement acceptance, Labs, Weight trends, I & O's, Skin  REASON FOR ASSESSMENT:   Malnutrition Screening Tool    ASSESSMENT:   66 y.o. male with past medical history significant for essential hypertension, orthostatic hypotension, hyperlipidemia, anxiety/depression, anemia, OSA on BiPAP, seizure disorder secondary to EtOH withdrawal, history of subs abuse, seasonal allergies, recent hospitalization at [facility] 7/24 - 8/20 for left-sided subdural hematoma s/p craniotomy complicated by hospital-acquired delirium who presented to [facility] ED on 10/10 with generalized weakness, multiple falls with progressive difficulty ambulating over the last 7 days.  Patient in room, just received his lunch meal of spaghetti and a brownie. Pt states his appetite has been fluctuating. On good days he consumes 2.5 meals but since his craniotomy his appetite has decreased. Pt states he like chocolate protein supplements, will order Boost. States he takes a daily MVI and thiamine at home. These have been resumed.   Pt reports UBW ~170 lbs. His weight has been fluctuating as well. Current weight: 165 lbs.  Medications: KLOR-CON, Thiamine, Lactated ringers, IV Mg sulfate  Labs reviewed: Low K Low Mg   NUTRITION - FOCUSED PHYSICAL EXAM:  Flowsheet Row Most Recent Value  Orbital Region Mild depletion  Upper Arm Region Moderate depletion  Thoracic and Lumbar Region Moderate depletion  Buccal Region Moderate depletion   Temple Region Moderate depletion  Clavicle Bone Region Moderate depletion  Clavicle and Acromion Bone Region Moderate depletion  Scapular Bone Region Moderate depletion  Dorsal Hand Moderate depletion  Patellar Region Severe depletion  Anterior Thigh Region Severe depletion  Posterior Calf Region Severe depletion  Edema (RD Assessment) None  Hair Reviewed  Eyes Reviewed  Mouth Reviewed  [dentures in]  Skin Reviewed       Diet Order:   Diet Order             Diet regular Room service appropriate? Yes; Fluid consistency: Thin  Diet effective now                   EDUCATION NEEDS:   No education needs have been identified at this time  Skin:  Skin Assessment: Reviewed RN Assessment  Last BM:  10/10 -type 5  Height:   Ht Readings from Last 1 Encounters:  08/24/22 6' (1.829 m)    Weight:   Wt Readings from Last 1 Encounters:  08/25/22 75.2 kg    BMI:  Body mass index is 22.48 kg/m.  Estimated Nutritional Needs:   Kcal:  1850-2050  Protein:  90-100g  Fluid:  2L/day   Clayton Bibles, MS, RD, LDN Inpatient Clinical Dietitian Contact information available via Amion

## 2022-08-25 NOTE — NC FL2 (Signed)
[place] MEDICAID FL2 LEVEL OF CARE SCREENING TOOL     IDENTIFICATION  Patient Name: Jose Li Birthdate: 1956/07/14 Sex: male Admission Date (Current Location): 08/24/2022  [facility] and [place] Number:  Herbalist and Address:  [facility],  [place], [place]      Provider Number: [id number]  Attending Physician Name and Address:  [place] Eric J, DO  Relative Name and Phone Number:  S/O Massie Bougie [phone]    Current Level of Care: Hospital Recommended Level of Care: [place] Prior Approval Number:    Date Approved/Denied:   PASRR Number: [id number]  Discharge Plan: SNF    Current Diagnoses: Patient Active Problem List   Diagnosis Date Noted   Orthostatic hypotension 08/24/2022   Rhabdomyolysis 08/24/2022   Hypomagnesemia 08/24/2022   Elevated troponin 08/24/2022   Dizziness 07/26/2022   Tachycardia 07/26/2022   Pressure injury of skin 06/21/2022   Malnutrition of moderate degree 06/10/2022   Status post craniotomy 06/08/2022   COVID-19 virus infection 04/14/2022   Anterior chest wall pain 12/06/2021   Need for immunization against influenza 09/11/2020   Pain of paraspinal muscle 01/14/2020   History of BPH 09/24/2019   Chronic kidney disease (CKD), stage III (moderate) (HCC) 07/13/2019   Varicose veins of right lower extremity with complications 0000000   TBI (traumatic brain injury) ([place])    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 [facility])    Bipolar affective disorder in remission ([place])    Essential hypertension    Orthostasis    Seizures (HCC)    Hypokalemia 02/05/2016   SDH (subdural hematoma) ([place]) 01/28/2016   OSA (obstructive sleep apnea) 10/01/2015   TOBACCO ABUSE 12/14/2007   Hyperlipidemia 10/09/2007   Epilepsy ([place]) 05/10/2007   BIPOLAR DISORDER 01/12/2007    Orientation RESPIRATION BLADDER Height & Weight     Self, Time, Situation, Place  Normal Continent Weight: 165 lb  12.6 oz (75.2 kg) Height:  6' (182.9 cm)  BEHAVIORAL SYMPTOMS/MOOD NEUROLOGICAL BOWEL NUTRITION STATUS      Continent Diet (Regular)  AMBULATORY STATUS COMMUNICATION OF NEEDS Skin   Limited Assist Verbally Normal                       Personal Care Assistance Level of Assistance  Bathing, Feeding, Dressing Bathing Assistance: Limited assistance Feeding assistance: Limited assistance Dressing Assistance: Limited assistance     Functional Limitations Info  Sight, Hearing, Speech Sight Info: Adequate Hearing Info: Adequate Speech Info: Adequate    SPECIAL CARE FACTORS FREQUENCY  PT (By licensed PT), OT (By licensed OT)     PT Frequency: 5x/wk OT Frequency: 5x/wk            Contractures Contractures Info: Not present    Additional Factors Info  Code Status, Allergies Code Status Info: FULL Allergies Info: Cymbalta (Duloxetine Hcl), Erythromycin, Hctz (Hydrochlorothiazide), Inderal (Propranolol)           Current Medications (08/25/2022):  This is the current hospital active medication list Current Facility-Administered Medications  Medication Dose Route Frequency Provider Last Rate Last Admin   acetaminophen (TYLENOL) tablet 650 mg  650 mg Oral Q6H PRN Reubin Milan, MD       Or   acetaminophen (TYLENOL) suppository 650 mg  650 mg Rectal Q6H PRN Reubin Milan, MD       aspirin EC tablet 81 mg  81 mg Oral Daily Reubin Milan, MD   81 mg  at 08/25/22 0954   atorvastatin (LIPITOR) tablet 20 mg  20 mg Oral Daily Reubin Milan, MD   20 mg at 08/25/22 0954   benztropine (COGENTIN) tablet 1 mg  1 mg Oral QHS Reubin Milan, MD   1 mg at 08/24/22 2113   busPIRone (BUSPAR) tablet 5 mg  5 mg Oral BID Reubin Milan, MD   5 mg at 08/25/22 0954   citalopram (CELEXA) tablet 20 mg  20 mg Oral Daily Reubin Milan, MD   20 mg at 08/25/22 1610   lactated ringers infusion   Intravenous Continuous [place] Eric J, DO 100 mL/hr at 08/25/22  1319 New Bag at 08/25/22 1319   lactose free nutrition (BOOST PLUS) liquid 237 mL  237 mL Oral TID WC [place] Donnamarie Poag, DO       levETIRAcetam (KEPPRA) tablet 1,500 mg  1,500 mg Oral BID Reubin Milan, MD   1,500 mg at 08/25/22 0954   loratadine (CLARITIN) tablet 10 mg  10 mg Oral Daily Reubin Milan, MD   10 mg at 08/25/22 9604   magnesium sulfate IVPB 2 g 50 mL  2 g Intravenous Once [place] Eric J, DO 50 mL/hr at 08/25/22 1320 2 g at 08/25/22 1320   midodrine (PROAMATINE) tablet 10 mg  10 mg Oral Q0600 Reubin Milan, MD   10 mg at 08/25/22 0506   midodrine (PROAMATINE) tablet 5 mg  5 mg Oral Daily Reubin Milan, MD   5 mg at 08/25/22 0954   midodrine (PROAMATINE) tablet 5 mg  5 mg Oral Q24H Reubin Milan, MD   5 mg at 08/25/22 1318   multivitamin with minerals tablet 1 tablet  1 tablet Oral Daily [place] Eric J, DO       nicotine (NICODERM CQ - dosed in mg/24 hours) patch 21 mg  21 mg Transdermal Daily Reubin Milan, MD   21 mg at 08/25/22 0955   pregabalin (LYRICA) capsule 75 mg  75 mg Oral BID Reubin Milan, MD   75 mg at 08/25/22 5409   risperiDONE (RISPERDAL) tablet 2 mg  2 mg Oral QHS Reubin Milan, MD   2 mg at 08/24/22 2112   tamsulosin (FLOMAX) capsule 0.4 mg  0.4 mg Oral QPC supper Reubin Milan, MD   0.4 mg at 08/24/22 1916   thiamine (VITAMIN B1) tablet 250 mg  250 mg Oral Daily Reubin Milan, MD   250 mg at 08/25/22 8119   traZODone (DESYREL) tablet 150 mg  150 mg Oral QHS Reubin Milan, MD   150 mg at 08/24/22 2112     Discharge Medications: Please see discharge summary for a list of discharge medications.  Relevant Imaging Results:  Relevant Lab Results:   Additional Information SSN: [id number]  Vassie Moselle, LCSW

## 2022-08-25 NOTE — Evaluation (Signed)
Physical Therapy Evaluation Patient Details Name: Jose Li MRN: [id number] DOB: March 13, 1956 Today's Date: 08/25/2022  History of Present Illness  This is a 66 year old male with history of subdural hematoma in July 2023 status post craniotomy, known history of orthostatic hypotension, who is being admitted 08/24/22 with orthostatic hypotension, resulting in fall to the floor without loss of consciousness, prolonged downtime, mildly elevated CPK level, and hypokalemia  Clinical Impression  Patient admitted for above  medical problems. The  patient reports feeling pain in legs,   and feeling dizzy when  sitting and standing. Patient  BP 137/70.  Patient resides alone, was ambulatory without a device, although he reports that he  has been waiting for a RW since leaving SNF.  Patient  has limited support and will benefit from SNF to return to Mod I.  Pt admitted with above diagnosis.   Pt currently with functional limitations due to the deficits listed below (see PT Problem List). Pt will benefit from skilled PT to increase their independence and safety with mobility to allow discharge to the venue listed below.       Recommendations for follow up therapy are one component of a multi-disciplinary discharge planning process, led by the attending physician.  Recommendations may be updated based on patient status, additional functional criteria and insurance authorization.  Follow Up Recommendations Skilled nursing-short term rehab (<3 hours/day) Can patient physically be transported by private vehicle: No    Assistance Recommended at Discharge Frequent or constant Supervision/Assistance  Patient can return home with the following  A lot of help with walking and/or transfers;A little help with bathing/dressing/bathroom;Help with stairs or ramp for entrance;Assistance with cooking/housework;Assist for transportation    Equipment Recommendations Rolling walker (2 wheels)  Recommendations for  Other Services       Functional Status Assessment Patient has had a recent decline in their functional status and demonstrates the ability to make significant improvements in function in a reasonable and predictable amount of time.     Precautions / Restrictions Precautions Precautions: Fall Restrictions Weight Bearing Restrictions: No      Mobility  Bed Mobility               General bed mobility comments: assist with trunk    Transfers Overall transfer level: Needs assistance Equipment used: Rolling walker (2 wheels) Transfers: Sit to/from Stand Sit to Stand: Min assist, +2 safety/equipment           General transfer comment: cues to take steps to recliner, reports feeling dizzy    Ambulation/Gait       Gait Pattern/deviations: Step-to pattern          Stairs            Wheelchair Mobility    Modified Rankin (Stroke Patients Only)       Balance   Sitting-balance support: No upper extremity supported Sitting balance-Leahy Scale: Fair     Standing balance support: Reliant on assistive device for balance, Bilateral upper extremity supported, During functional activity Standing balance-Leahy Scale: Poor                               Pertinent Vitals/Pain Pain Assessment Faces Pain Scale: Hurts a little bit Pain Location: soreness in R shoulder, eblow and trunk with reports from crawling on the floor. Pain Descriptors / Indicators: Grimacing, Discomfort Pain Intervention(s): Monitored during session    Home Living Family/patient expects to be discharged to::  Private residence Living Arrangements: Alone Available Help at Discharge: Available PRN/intermittently Type of Home: House Home Access: Stairs to enter   Entrance Stairs-Number of Steps: 2 Alternate Level Stairs-Number of Steps: 12 Home Layout: Two level   Additional Comments: patient was noted to have some contradictions during PLOF report. reports girlfriend not  availble.    Prior Function Prior Level of Function : Independent/Modified Independent               ADLs Comments: patient reported taking cabs to get groceries and to MD appointments iif girlfriend was unable to take him     Hand Dominance   Dominant Hand: Right    Extremity/Trunk Assessment   Upper Extremity Assessment Upper Extremity Assessment: Generalized weakness    Lower Extremity Assessment Lower Extremity Assessment: Generalized weakness    Cervical / Trunk Assessment Cervical / Trunk Assessment: Kyphotic  Communication   Communication: No difficulties  Cognition Arousal/Alertness: Awake/alert Behavior During Therapy: Flat affect Overall Cognitive Status: Within Functional Limits for tasks assessed                                 General Comments: patient was cooperative and able to follow commands.        General Comments      Exercises     Assessment/Plan    PT Assessment Patient needs continued PT services  PT Problem List Decreased strength;Decreased balance;Decreased knowledge of precautions;Decreased mobility;Decreased activity tolerance;Decreased safety awareness;Decreased knowledge of use of DME;Pain;Decreased cognition       PT Treatment Interventions DME instruction;Functional mobility training;Patient/family education;Gait training;Therapeutic activities;Therapeutic exercise    PT Goals (Current goals can be found in the Care Plan section)  Acute Rehab PT Goals Patient Stated Goal: to walk, get a RW PT Goal Formulation: With patient Time For Goal Achievement: 09/08/22 Potential to Achieve Goals: Fair    Frequency Min 2X/week     Co-evaluation PT/OT/SLP Co-Evaluation/Treatment: Yes Reason for Co-Treatment: To address functional/ADL transfers PT goals addressed during session: Mobility/safety with mobility OT goals addressed during session: ADL's and self-care       AM-PAC PT "6 Clicks" Mobility  Outcome  Measure Help needed turning from your back to your side while in a flat bed without using bedrails?: A Little Help needed moving from lying on your back to sitting on the side of a flat bed without using bedrails?: A Little Help needed moving to and from a bed to a chair (including a wheelchair)?: A Lot Help needed standing up from a chair using your arms (e.g., wheelchair or bedside chair)?: A Lot Help needed to walk in hospital room?: A Lot Help needed climbing 3-5 steps with a railing? : Total 6 Click Score: 13    End of Session Equipment Utilized During Treatment: Gait belt Activity Tolerance: Patient limited by fatigue Patient left: in chair;with call bell/phone within reach;with chair alarm set Nurse Communication: Mobility status PT Visit Diagnosis: Unsteadiness on feet (R26.81);Muscle weakness (generalized) (M62.81);Difficulty in walking, not elsewhere classified (R26.2);Pain Pain - Right/Left: Left Pain - part of body: Shoulder    Time: 0925-0950 PT Time Calculation (min) (ACUTE ONLY): 25 min   Charges:   PT Evaluation $PT Eval Low Complexity: 1 Low PT Treatments $Therapeutic Activity: 8-22 mins        [place] Office [phone] Weekend [phone]   Claretha Cooper 08/25/2022, 12:43 PM

## 2022-08-25 NOTE — Progress Notes (Signed)
Patient continues to decline nocturnal CPAP for tonight. Equipment remains at bedside on standby. He is aware that he may request RT assistance at any time, should he change his mind and wish to wear it.

## 2022-08-25 NOTE — Evaluation (Signed)
Occupational Therapy Evaluation Patient Details Name: Jose Li MRN: [id number] DOB: Dec 16, 1955 Today's Date: 08/25/2022   History of Present Illness This is a 66 year old male with history of subdural hematoma in July 2023 status post craniotomy, known history of orthostatic hypotension, who is being admitted 08/24/22 with orthostatic hypotension, resulting in fall to the floor without loss of consciousness, prolonged downtime, mildly elevated CPK level, and hypokalemia   Clinical Impression   Patient is a 66 year old male who was admitted for above. Patient was living at home alone prior level. Patient was noted to have decreased functional activity tolerance, decreased standing balance, increased pain, decreased strength impacting participation in Adls. Patient would continue to benefit from skilled OT services at this time while admitted and after d/c to address noted deficits in order to improve overall safety and independence in ADLs.       Recommendations for follow up therapy are one component of a multi-disciplinary discharge planning process, led by the attending physician.  Recommendations may be updated based on patient status, additional functional criteria and insurance authorization.   Follow Up Recommendations  Skilled nursing-short term rehab (<3 hours/day)    Assistance Recommended at Discharge Frequent or constant Supervision/Assistance  Patient can return home with the following A little help with walking and/or transfers;A little help with bathing/dressing/bathroom;Assistance with cooking/housework;Direct supervision/assist for medications management;Direct supervision/assist for financial management;Help with stairs or ramp for entrance    Functional Status Assessment  Patient has had a recent decline in their functional status and demonstrates the ability to make significant improvements in function in a reasonable and predictable amount of time.  Equipment  Recommendations  Other (comment) (defer to next venue)    Recommendations for Other Services       Precautions / Restrictions Precautions Precautions: Fall Restrictions Weight Bearing Restrictions: No      Mobility Bed Mobility Overal bed mobility: Needs Assistance Bed Mobility: Supine to Sit     Supine to sit: Min assist, HOB elevated          Transfers Overall transfer level: Needs assistance Equipment used: Rolling walker (2 wheels) Transfers: Sit to/from Stand Sit to Stand: Min assist, +2 safety/equipment                  Balance Overall balance assessment: Needs assistance Sitting-balance support: No upper extremity supported Sitting balance-Leahy Scale: Fair     Standing balance support: Reliant on assistive device for balance, Bilateral upper extremity supported Standing balance-Leahy Scale: Poor                             ADL either performed or assessed with clinical judgement   ADL Overall ADL's : Needs assistance/impaired   Eating/Feeding Details (indicate cue type and reason): no food in room at time but patient reported having a difficult time with breakfast this AM, patient did report not having dentures in place.. patient was educated on usign dentures for meals if needed. patietn was provided with denture paste to keep them in place. patient was able to compelte denture care seated in recliner. nurse made aware. nurse to check on patietn at lunch and order SLP if needed. Grooming: Therapist, nutritional;Set up;Sitting   Upper Body Bathing: Minimal assistance;Sitting   Lower Body Bathing: Sit to/from stand;Moderate assistance;Sitting/lateral leans   Upper Body Dressing : Minimal assistance;Sitting   Lower Body Dressing: Moderate assistance;Sit to/from stand;Sitting/lateral leans   Toilet Transfer: Minimal assistance;+2 for safety/equipment;+2  for physical assistance Toilet Transfer Details (indicate cue type and reason): with RW from  edge of bed to recliner Toileting- Clothing Manipulation and Hygiene: Maximal assistance;Sit to/from stand Toileting - Clothing Manipulation Details (indicate cue type and reason): needed BUE support on RW for standing balance.             Vision         Perception     Praxis      Pertinent Vitals/Pain Pain Assessment Pain Assessment: Faces Faces Pain Scale: Hurts a little bit Pain Location: soreness in R shoulder, eblow and trunk with reports from crawling on the floor. Pain Descriptors / Indicators: Grimacing, Discomfort Pain Intervention(s): Monitored during session     Hand Dominance Right   Extremity/Trunk Assessment Upper Extremity Assessment Upper Extremity Assessment: Generalized weakness   Lower Extremity Assessment Lower Extremity Assessment: Generalized weakness   Cervical / Trunk Assessment Cervical / Trunk Assessment: Kyphotic   Communication Communication Communication: No difficulties   Cognition Arousal/Alertness: Awake/alert Behavior During Therapy: Flat affect Overall Cognitive Status: Within Functional Limits for tasks assessed                                 General Comments: patient was cooperative and able to follow commands.     General Comments       Exercises     Shoulder Instructions      Home Living Family/patient expects to be discharged to:: Private residence Living Arrangements: Alone Available Help at Discharge: Available PRN/intermittently Type of Home: House Home Access: Stairs to enter [employer] of Steps: 2   Home Layout: Two level Alternate Level Stairs-Number of Steps: 12 Alternate Level Stairs-Rails: Right Bathroom Shower/Tub: Tub/shower unit             Additional Comments: patient was noted to have some contradictions during PLOF report. reports girlfriend not availble.      Prior Functioning/Environment Prior Level of Function : Independent/Modified Independent                ADLs Comments: patient reported taking cabs to get groceries and to MD appointments iif girlfriend was unable to take him        OT Problem List: Decreased strength;Impaired balance (sitting and/or standing);Decreased cognition;Pain;Decreased knowledge of precautions;Decreased safety awareness;Decreased activity tolerance      OT Treatment/Interventions: Self-care/ADL training;DME and/or AE instruction;Therapeutic activities;Balance training;Patient/family education;Energy conservation    OT Goals(Current goals can be found in the care plan section) Acute Rehab OT Goals Patient Stated Goal: to get better OT Goal Formulation: With patient Time For Goal Achievement: 09/08/22 Potential to Achieve Goals: Fair  OT Frequency: Min 2X/week    Co-evaluation   Reason for Co-Treatment: To address functional/ADL transfers PT goals addressed during session: Mobility/safety with mobility OT goals addressed during session: ADL's and self-care      AM-PAC OT "6 Clicks" Daily Activity     Outcome Measure Help from another person eating meals?: A Little Help from another person taking care of personal grooming?: A Little Help from another person toileting, which includes using toliet, bedpan, or urinal?: A Lot Help from another person bathing (including washing, rinsing, drying)?: A Lot Help from another person to put on and taking off regular upper body clothing?: A Little Help from another person to put on and taking off regular lower body clothing?: A Lot 6 Click Score: 15   End of Session Equipment Utilized During  Treatment: Gait belt;Rolling walker (2 wheels) Nurse Communication: Mobility status (patients reports of having hard time with meal this AM with no dentures in place.)  Activity Tolerance: Patient tolerated treatment well Patient left: in chair;with call bell/phone within reach;with chair alarm set (with education to sit up for at least half hour today)  OT Visit  Diagnosis: Unsteadiness on feet (R26.81);Other abnormalities of gait and mobility (R26.89);Muscle weakness (generalized) (M62.81)                Time: 6294-7654 OT Time Calculation (min): 24 min Charges:  OT General Charges $OT Visit: 1 Visit OT Evaluation $OT Eval Moderate Complexity: 1 Mod  Dillon Mcreynolds OTR/L, MS Acute Rehabilitation Department Office# [phone]   Marcellina Millin 08/25/2022, 12:40 PM

## 2022-08-26 LAB — BASIC METABOLIC PANEL
Anion gap: 4 — ABNORMAL LOW (ref 5–15)
BUN: 7 mg/dL — ABNORMAL LOW (ref 8–23)
CO2: 23 mmol/L (ref 22–32)
Calcium: 8.1 mg/dL — ABNORMAL LOW (ref 8.9–10.3)
Chloride: 111 mmol/L (ref 98–111)
Creatinine, Ser: 0.85 mg/dL (ref 0.61–1.24)
GFR, Estimated: >60 mL/min (ref >60)
Glucose, Bld: 101 mg/dL — ABNORMAL HIGH (ref 70–99)
Potassium: 4 mmol/L (ref 3.5–5.1)
Sodium: 138 mmol/L (ref 135–145)

## 2022-08-26 LAB — CK: Total CK: 773 U/L — ABNORMAL HIGH (ref 49–397)

## 2022-08-26 LAB — MAGNESIUM: Magnesium: 1.7 mg/dL (ref 1.7–2.4)

## 2022-08-26 MED ORDER — MAGNESIUM SULFATE 2 GM/50ML IV SOLN
2.0000 g | Freq: Once | INTRAVENOUS | Status: AC
  Administered 2022-08-26: 2 g via INTRAVENOUS
  Filled 2022-08-26: qty 50

## 2022-08-26 NOTE — Progress Notes (Signed)
PROGRESS NOTE    Jose Li  HYW:[id number] DOB: 08-12-1956 DOA: 08/24/2022 PCP: Bess Kinds, MD    Brief Narrative:   Jose Li is a 66 y.o. male with past medical history significant for essential hypertension, orthostatic hypotension, hyperlipidemia, anxiety/depression, anemia, OSA on BiPAP, seizure disorder secondary to EtOH withdrawal, history of subs abuse, seasonal allergies, recent hospitalization at [facility] 7/24 - 8/20 for left-sided subdural hematoma s/p craniotomy complicated by hospital-acquired delirium who presented to [facility] ED on 10/10 with generalized weakness, multiple falls with progressive difficulty ambulating over the last 7 days.  Patient reports he was unable to get up from the floor, denies loss of consciousness; and did not report striking his head.  In the ED, temperature 98.0 F, HR 90, RR 18, BP 130/80, SPO2 100% on room air.  Sodium 135, potassium 2.3, chloride 106, CO2 22, glucose 103, BUN 7, creatinine 1.25.  Lipase 64.  AST 42, ALT 25, total bilirubin 0.6.  CK 1613.  High sensitive troponin 33>25.  WBC 14.3, hemoglobin 14.3, platelets 475.  INR 0.9.  Urinalysis unrevealing.  CT head/C-spine with no acute intracranial abnormality, no calvarial fracture, no acute fracture or listhesis of the cervical spine.  Chest x-ray with minimal left basilar atelectasis versus scarring.  Patient received 3 L LR bolus, potassium supplementation and magnesium IV PB.  Hospitalist service consulted for admission for further evaluation and treatment of generalized weakness, mild rhabdomyolysis, orthostatic hypotension  Assessment & Plan:   Rhabdomyolysis, mild Patient presenting to the ED following multiple falls and generalized weakness.  Reports has not been unable to ambulate over the last several days.  CK level elevated 1613 on admission. --CK [id number] --Continue IV fluid hydration --CK daily --Continue PT/OT efforts while inpatient  Orthostatic hypotension Patient was  notably orthostatic upon ED presentation, likely secondary to vomit up lesion in the setting of dehydration as he has been unable to mobilize well over the last week.  Patient received 3 L LR bolus in the ED. --Repeat orthostatic vital signs normal this morning --Continue midodrine 10 mg every morning, 5 mg at 1000am and 200pm --Continue to encourage increased oral intake --Orthostatic vital signs daily  Hypokalemia Hypomagnesemia Potassium 4.0, magnesium 1.7 today; will continue repletion of magnesium today. -- Repeat electrolytes in a.m.  Elevated troponin Troponin mildly elevated on admission, 33 followed by 25.  EKG without dynamic changes.  Etiology likely secondary to type II demand ischemia in the setting of dehydration/rhabdomyolysis as above. -- Continue monitor on telemetry  Dysphagia RN reports choking episodes x2 yesterday and family members do report occasional choking episodes at home. -- SLP evaluation pending  Hx epilepsy -- Keppra 1500 mg p.o. twice daily  Hyperlipidemia -- Atorvastatin 20 g p.o. daily  Tobacco use disorder Counseled on need for complete tobacco cessation. -- Continue patch  Hx EtoH abuse Patient reports no alcohol use over the last week.  Counseled on need for complete cessation.  Essential hypertension Now off of antihypertensives due to orthostatic hypotension, currently on midodrine. --Continue monitor BP closely, orthostatics daily  History of BPH -- Tamsulosin 0.4 mg p.o. nightly  Weakness/debility/deconditioning: -- PT/OT, and SNF placement, accepted at [facility] SNF pending insurance authorization. -- Continue therapy efforts while inpatient   DVT prophylaxis: SCDs Start: 08/24/22 0649    Code Status: Full Code Family Communication: No family present at bedside this morning  Disposition Plan:  Level of care: Med-Surg Status is: Inpatient Remains inpatient appropriate because: Pending insurance authorization for SNF  placement  Consultants:  None  Procedures:  None  Antimicrobials:  None   Subjective: Seen examined bedside, resting calmly.  Lying in bed.  No specific complaints this morning other than continued mild weakness/myalgias.  No other questions or concerns at this time.  Denies headache, no dizziness, no chest pain, no palpitations, no shortness of breath, no abdominal pain, no focal weakness, no fever/chills/night sweats, no nausea/vomiting/diarrhea, no cough/congestion, n paresthesias.  No acute events overnight per nursing staff.  Medically stable for discharge to SNF following insurance authorization and bed availability.  Objective: Vitals:   08/25/22 1419 08/25/22 2038 08/26/22 0433 08/26/22 0500  BP: (!) 145/60 (!) 171/82 (!) 174/82   Pulse: 92 87 90   Resp: 16 20 20    Temp: 98.4 F (36.9 C) 98.1 F (36.7 C) 98.2 F (36.8 C)   TempSrc: Oral Oral Oral   SpO2: 98% 97% 95%   Weight:    75 kg  Height:        Intake/Output Summary (Last 24 hours) at 08/26/2022 1128 Last data filed at 08/26/2022 0854 Gross per 24 hour  Intake 3058.38 ml  Output 2850 ml  Net 208.38 ml   Filed Weights   08/24/22 0041 08/25/22 0500 08/26/22 0500  Weight: 72.6 kg 75.2 kg 75 kg    Examination:  Physical Exam: GEN: NAD, alert and oriented x 3, chronically ill appearance, appears older than stated age HEENT: NCAT, PERRL, EOMI, sclera clear, MMM PULM: CTAB w/o wheezes/crackles, normal respiratory effort, on room air CV: RRR w/o M/G/R GI: abd soft, NTND, NABS, no R/G/M MSK: no peripheral edema, moves all extremities independently NEURO: CN II-XII intact, no focal deficits, sensation to light touch intact PSYCH: normal mood/affect Integumentary: dry/intact, no rashes or wounds    Data Reviewed: I have personally reviewed following labs and imaging studies  CBC: Recent Labs  Lab 08/24/22 0045 08/24/22 0126 08/25/22 0500  WBC 14.3*  --  9.4  NEUTROABS 10.4*  --   --   HGB 14.3  12.9* 11.2*  HCT 42.7 38.0* 34.6*  MCV 82.0  --  84.4  PLT 475*  --  662   Basic Metabolic Panel: Recent Labs  Lab 08/24/22 0045 08/24/22 0126 08/24/22 0345 08/25/22 0500 08/26/22 0506  NA 135 138  --  138 138  K 2.3* 3.0*  --  3.4* 4.0  CL 106 102  --  112* 111  CO2 22  --   --  20* 23  GLUCOSE 103* 102*  --  90 101*  BUN 7* 5*  --  8 7*  CREATININE 1.25* 1.10  --  0.78 0.85  CALCIUM 9.2  --   --  7.9* 8.1*  MG  --   --  1.2* 1.6* 1.7   GFR: Estimated Creatinine Clearance: 93.1 mL/min (by C-G formula based on SCr of 0.85 mg/dL). Liver Function Tests: Recent Labs  Lab 08/24/22 0045 08/25/22 0500  AST 42* 26  ALT 25 19  ALKPHOS 113 75  BILITOT 0.6 0.4  PROT 7.3 5.2*  ALBUMIN 4.2 2.7*   Recent Labs  Lab 08/24/22 0045  LIPASE 64*   No results for input(s): "AMMONIA" in the last 168 hours. Coagulation Profile: Recent Labs  Lab 08/24/22 0045  INR 0.9   Cardiac Enzymes: Recent Labs  Lab 08/24/22 0045 08/25/22 0500 08/26/22 0506  CKTOTAL 1,613* 827* 773*   BNP (last 3 results) No results for input(s): "PROBNP" in the last 8760 hours. HbA1C: No results for input(s): "HGBA1C" in  the last 72 hours. CBG: No results for input(s): "GLUCAP" in the last 168 hours. Lipid Profile: No results for input(s): "CHOL", "HDL", "LDLCALC", "TRIG", "CHOLHDL", "LDLDIRECT" in the last 72 hours. Thyroid Function Tests: No results for input(s): "TSH", "T4TOTAL", "FREET4", "T3FREE", "THYROIDAB" in the last 72 hours. Anemia Panel: No results for input(s): "VITAMINB12", "FOLATE", "FERRITIN", "TIBC", "IRON", "RETICCTPCT" in the last 72 hours. Sepsis Labs: Recent Labs  Lab 08/24/22 0115 08/24/22 0315  LATICACIDVEN 1.0 1.4    No results found for this or any previous visit (from the past 240 hour(s)).       Radiology Studies: No results found.      Scheduled Meds:  aspirin EC  81 mg Oral Daily   atorvastatin  20 mg Oral Daily   benztropine  1 mg Oral QHS    busPIRone  5 mg Oral BID   citalopram  20 mg Oral Daily   lactose free nutrition  237 mL Oral TID WC   levETIRAcetam  1,500 mg Oral BID   loratadine  10 mg Oral Daily   midodrine  10 mg Oral Q0600   midodrine  5 mg Oral Daily   midodrine  5 mg Oral Q24H   multivitamin with minerals  1 tablet Oral Daily   nicotine  21 mg Transdermal Daily   pregabalin  75 mg Oral BID   risperiDONE  2 mg Oral QHS   tamsulosin  0.4 mg Oral QPC supper   thiamine  250 mg Oral Daily   traZODone  150 mg Oral QHS   Continuous Infusions:  lactated ringers 100 mL/hr at 08/26/22 0505     LOS: 2 days    Time spent: 51 minutes spent on chart review, discussion with nursing staff, consultants, updating family and interview/physical exam; more than 50% of that time was spent in counseling and/or coordination of care.    Alvira Philips [place], DO [facility] Available via Epic secure chat 7am-7pm After these hours, please refer to coverage provider listed on [email or web address] 08/26/2022, 11:28 AM

## 2022-08-26 NOTE — Evaluation (Signed)
Clinical/Bedside Swallow Evaluation Patient Details  Name: Jose Li MRN: [id number] Date of Birth: [birth year]  Today's Date: 08/26/2022 Time: SLP Start Time (ACUTE ONLY): 1038 SLP Stop Time (ACUTE ONLY): 1103 SLP Time Calculation (min) (ACUTE ONLY): 25 min  Past Medical History:  Past Medical History:  Diagnosis Date   Acute kidney injury (HCC) 05/26/2018   Allergy    Anemia    Anxiety    Depression    Hyperlipidemia    Hypertension, essential, benign 08/02/2012   Orthostatic hypotension 10/09/2013   OSA (obstructive sleep apnea) 10/01/2015   Severe OSA with an AHI of 86/hr now on BiPAP at 23/19cm H2O   Rhabdomyolysis 08/24/2022   Seizures (HCC)    none for last 6-51months   Substance abuse (HCC)    Past Surgical History:  Past Surgical History:  Procedure Laterality Date   CRANIOTOMY Left 06/08/2022   Procedure: CRANIOTOMY HEMATOMA EVACUATION SUBDURAL;  Surgeon: Julio Sicks, MD;  Location: [facility] OR;  Service: Neurosurgery;  Laterality: Left;   FRACTURE SURGERY  1990   right hip and femur   HERNIA REPAIR  07/2009   umbilical hernia/ got infected   JOINT REPLACEMENT Right 1990   right hip   TRACHEOSTOMY  08/1999   Had pneumoniaand was in a coma 10 days   HPI:  Jose Li is a 66 y.o. male with medical history significant of seasonal allergies, anemia, anxiety, depression, hyperlipidemia, hypertension, orthostatic hypotension, OSA on BiPAP, seizure disorder secondary to EtOH withdrawal, substance abuse, history of AKI who was admitted and discharged to [facility] from 06/07/2022 until 07/04/2022 after requiring craniotomy by neurosurgery for for left-sided subdural hematoma with postop period complicated by delirium who was brought to the emergency department due to having issues ambulating for the past 7 days after he fell that his legs quit working; CXR min L basilar atelectasis or scarring; CT head negative for acute issues.  BSE generated by nursing d/t intermittent  coughing with meals.    Assessment / Plan / Recommendation  Clinical Impression  Pt seen for clinical swallowing evaluation with cognitve-based dysphagia symptoms including impulsivity with bite size/sips during consumption without min-mod verbal cues from SLP.  Pt would state precautions but intermittently implement them without supervision provided for slow rate/small bites and sips.  No overt s/s of aspiration noted during this trial, but he is at risk for aspiration if swallowing precautions are not followed and FULL supervision provided.  If symptoms persist with impulsivity during meals, downgrading to Dysphagia 3(mechanical soft)/thin without straws may be warranted.  Recommend Regular/thin liquids utilizing swallowing precautions above and FULL supervision during PO intake.  ST will f/u in acute setting for diet tolerance/education.  ST may be beneficial for cognitive impairment at next venue of care.  Thank you for this consult. SLP Visit Diagnosis: Dysphagia, unspecified (R13.10)    Aspiration Risk  Mild aspiration risk;Moderate aspiration risk    Diet Recommendation   Regular/thin liquids   Medication Administration: Whole meds with liquid    Other  Recommendations Oral Care Recommendations: Oral care BID;Patient independent with oral care    Recommendations for follow up therapy are one component of a multi-disciplinary discharge planning process, led by the attending physician.  Recommendations may be updated based on patient status, additional functional criteria and insurance authorization.  Follow up Recommendations Follow physician's recommendations for discharge plan and follow up therapies      Assistance Recommended at Discharge Frequent or constant Supervision/Assistance  Functional Status Assessment Patient  has had a recent decline in their functional status and demonstrates the ability to make significant improvements in function in a reasonable and predictable amount of  time.  Frequency and Duration min 1 x/week  1 week       Prognosis Prognosis for Safe Diet Advancement: Good Barriers to Reach Goals: Cognitive deficits      Swallow Study   General Date of Onset: 08/24/22 HPI: Jose Li is a 66 y.o. male with medical history significant of seasonal allergies, anemia, anxiety, depression, hyperlipidemia, hypertension, orthostatic hypotension, OSA on BiPAP, seizure disorder secondary to EtOH withdrawal, substance abuse, history of AKI who was admitted and discharged to [facility] from 06/07/2022 until 07/04/2022 after requiring craniotomy by neurosurgery for for left-sided subdural hematoma with postop period complicated by delirium who was brought to the emergency department due to having issues ambulating for the past 7 days after he fell that his legs quit working; CXR min L basilar atelectasis or scarring; CT head negative for acute issues.  BSE generated by nursing d/t intermittent coughing with meals. Type of Study: Bedside Swallow Evaluation Previous Swallow Assessment: n/a Diet Prior to this Study: Regular;Thin liquids Temperature Spikes Noted: No Respiratory Status: Room air History of Recent Intubation: No Behavior/Cognition: Alert;Cooperative;Requires cueing Oral Cavity Assessment: Within Functional Limits Oral Care Completed by SLP: No Oral Cavity - Dentition: Dentures, top;Dentures, bottom Vision: Functional for self-feeding Self-Feeding Abilities: Able to feed self Patient Positioning: Upright in bed Baseline Vocal Quality: Normal;Other (comment) (slightly dysarthric) Volitional Cough: Strong Volitional Swallow: Able to elicit    Oral/Motor/Sensory Function Overall Oral Motor/Sensory Function: Generalized oral weakness   Ice Chips Ice chips: Not tested   Thin Liquid Thin Liquid: Impaired Presentation: Self Fed;Straw Other Comments: impulsive with cueing required to take small sips    Nectar Thick Nectar Thick Liquid: Not  tested   Honey Thick Honey Thick Liquid: Not tested   Puree Puree: Within functional limits Presentation: Self Fed   Solid     Solid: Impaired Presentation: Self Fed Oral Phase Impairments: Impaired mastication Oral Phase Functional Implications: Impaired mastication Other Comments: impulsive with bites/needs cues for smaller bites      Elvina Sidle, M.S., CCC-SLP 08/26/2022,12:59 PM

## 2022-08-26 NOTE — Plan of Care (Signed)

## 2022-08-26 NOTE — TOC Progression Note (Signed)
Transition of Care [facility]) - Progression Note    Patient Details  Name: AUDEN TATAR MRN: [id number] Date of Birth: 04-30-1956  Transition of Care [facility]) CM/SW [place], LCSW Phone Number: 08/26/2022, 10:43 AM  Clinical Narrative:    CSW met with pt to review bed offers for SNF. Pt has accepted bed offer for [facility]. Insurance authorization has been requested and is currently pending approval.    Expected Discharge Plan: Skilled Nursing Facility Barriers to Discharge: No Barriers Identified  Expected Discharge Plan and Services Expected Discharge Plan: [place] In-house Referral: NA Discharge Planning Services: CM Consult Post Acute Care Choice: [place] Living arrangements for the past 2 months: Single Family Home                 DME Arranged: N/A DME Agency: NA                   Social Determinants of Health (SDOH) Interventions    Readmission Risk Interventions    08/25/2022    1:50 PM  Readmission Risk Prevention Plan  Transportation Screening Complete  PCP or Specialist Appt within 3-5 Days Complete  HRI or [place] Complete  Social Work Consult for [place] Planning/Counseling Complete  Palliative Care Screening Not Applicable  Medication Review Press photographer) Complete

## 2022-08-27 DIAGNOSIS — R059 Cough, unspecified: Secondary | ICD-10-CM | POA: Diagnosis not present

## 2022-08-27 DIAGNOSIS — Z743 Need for continuous supervision: Secondary | ICD-10-CM | POA: Diagnosis not present

## 2022-08-27 DIAGNOSIS — S065X0D Traumatic subdural hemorrhage without loss of consciousness, subsequent encounter: Secondary | ICD-10-CM | POA: Diagnosis not present

## 2022-08-27 DIAGNOSIS — J309 Allergic rhinitis, unspecified: Secondary | ICD-10-CM | POA: Diagnosis not present

## 2022-08-27 DIAGNOSIS — W19XXXA Unspecified fall, initial encounter: Secondary | ICD-10-CM | POA: Diagnosis not present

## 2022-08-27 DIAGNOSIS — I10 Essential (primary) hypertension: Secondary | ICD-10-CM | POA: Diagnosis not present

## 2022-08-27 DIAGNOSIS — F32A Depression, unspecified: Secondary | ICD-10-CM | POA: Diagnosis not present

## 2022-08-27 DIAGNOSIS — N183 Chronic kidney disease, stage 3 unspecified: Secondary | ICD-10-CM | POA: Diagnosis not present

## 2022-08-27 DIAGNOSIS — Z7401 Bed confinement status: Secondary | ICD-10-CM | POA: Diagnosis not present

## 2022-08-27 DIAGNOSIS — R569 Unspecified convulsions: Secondary | ICD-10-CM | POA: Diagnosis not present

## 2022-08-27 DIAGNOSIS — E782 Mixed hyperlipidemia: Secondary | ICD-10-CM | POA: Diagnosis not present

## 2022-08-27 DIAGNOSIS — R1310 Dysphagia, unspecified: Secondary | ICD-10-CM | POA: Diagnosis not present

## 2022-08-27 DIAGNOSIS — I951 Orthostatic hypotension: Secondary | ICD-10-CM | POA: Diagnosis not present

## 2022-08-27 DIAGNOSIS — E440 Moderate protein-calorie malnutrition: Secondary | ICD-10-CM | POA: Diagnosis not present

## 2022-08-27 DIAGNOSIS — R531 Weakness: Secondary | ICD-10-CM | POA: Diagnosis not present

## 2022-08-27 DIAGNOSIS — M6282 Rhabdomyolysis: Secondary | ICD-10-CM | POA: Diagnosis not present

## 2022-08-27 LAB — BASIC METABOLIC PANEL
Anion gap: 4 — ABNORMAL LOW (ref 5–15)
BUN: 7 mg/dL — ABNORMAL LOW (ref 8–23)
CO2: 27 mmol/L (ref 22–32)
Calcium: 8.3 mg/dL — ABNORMAL LOW (ref 8.9–10.3)
Chloride: 109 mmol/L (ref 98–111)
Creatinine, Ser: 0.7 mg/dL (ref 0.61–1.24)
GFR, Estimated: >60 mL/min (ref >60)
Glucose, Bld: 103 mg/dL — ABNORMAL HIGH (ref 70–99)
Potassium: 4.3 mmol/L (ref 3.5–5.1)
Sodium: 140 mmol/L (ref 135–145)

## 2022-08-27 LAB — MAGNESIUM: Magnesium: 1.6 mg/dL — ABNORMAL LOW (ref 1.7–2.4)

## 2022-08-27 MED ORDER — MAGNESIUM SULFATE 2 GM/50ML IV SOLN
2.0000 g | Freq: Once | INTRAVENOUS | Status: AC
  Administered 2022-08-27: 2 g via INTRAVENOUS
  Filled 2022-08-27: qty 50

## 2022-08-27 NOTE — TOC Transition Note (Signed)
Transition of Care [facility]) - CM/SW Discharge Note   Patient Details  Name: Jose Li MRN: [id number] Date of Birth: [birth year]  Transition of Care [facility]) CM/SW Contact:  Vassie Moselle, LCSW Phone Number: 08/27/2022, 10:46 AM   Clinical Narrative:    Pt is to transfer to [facility] for SNF and will be going to room 3250. RN to call report to [phone]. Pt will be transported to facility via [facility]. Pt and s/o other aware and agreeable to the plan.    Final next level of care: Skilled Nursing Facility Barriers to Discharge: No Barriers Identified   Patient Goals and CMS Choice Patient states their goals for this hospitalization and ongoing recovery are:: To go to SNF CMS Medicare.gov Compare Post Acute Care list provided to:: Patient Choice offered to / list presented to : Patient  Discharge Placement   Existing PASRR number confirmed : 08/25/22          Patient chooses bed at: [facility] Patient to be transferred to facility by: [place] Name of family member notified: Massie Bougie Patient and family notified of of transfer: 08/27/22  Discharge Plan and Services In-house Referral: NA Discharge Planning Services: CM Consult Post Acute Care Choice: [place]          DME Arranged: N/A DME Agency: NA                  Social Determinants of Health ([place]) Interventions     Readmission Risk Interventions    08/25/2022    1:50 PM  Readmission Risk Prevention Plan  Transportation Screening Complete  PCP or Specialist Appt within 3-5 Days Complete  HRI or [place] Complete  Social Work Consult for [place] Planning/Counseling Complete  Palliative Care Screening Not Applicable  Medication Review Press photographer) Complete

## 2022-08-27 NOTE — TOC Progression Note (Signed)
Transition of Care [facility]) - Progression Note    Patient Details  Name: Jose Li MRN: [id number] Date of Birth: 05/13/1956  Transition of Care [facility]) CM/SW [place], [place] Phone Number: 08/27/2022, 8:32 AM  Clinical Narrative:    Pt's insurance authorization has been approved for SNF from 10/13 to 10/17. Navi ID: [id number].    Expected Discharge Plan: Skilled Nursing Facility Barriers to Discharge: No Barriers Identified  Expected Discharge Plan and Services Expected Discharge Plan: [place] In-house Referral: NA Discharge Planning Services: CM Consult Post Acute Care Choice: [place] Living arrangements for the past 2 months: Single Family Home                 DME Arranged: N/A DME Agency: NA                   Social Determinants of Health (SDOH) Interventions    Readmission Risk Interventions    08/25/2022    1:50 PM  Readmission Risk Prevention Plan  Transportation Screening Complete  PCP or Specialist Appt within 3-5 Days Complete  HRI or [place] Complete  Social Work Consult for [place] Planning/Counseling Complete  Palliative Care Screening Not Applicable  Medication Review Press photographer) Complete

## 2022-08-27 NOTE — Discharge Summary (Signed)
Physician Discharge Summary  Jose Li PFX:[id number] DOB: 06/24/1956 DOA: 08/24/2022  PCP: Bess Kinds, MD  Admit date: 08/24/2022 Discharge date: 08/27/2022  Admitted From: Home Disposition: [facility]  Recommendations for Outpatient Follow-up:  Follow up with PCP in 1-2 weeks  Discharge Condition: Stable CODE STATUS: Full code Diet recommendation: Regular diet  History of present illness:  Jose Li is a 66 y.o. male with past medical history significant for essential hypertension, orthostatic hypotension, hyperlipidemia, anxiety/depression, anemia, OSA on BiPAP, seizure disorder secondary to EtOH withdrawal, history of subs abuse, seasonal allergies, recent hospitalization at [facility] 7/24 - 8/20 for left-sided subdural hematoma s/p craniotomy complicated by hospital-acquired delirium who presented to [facility] ED on 10/10 with generalized weakness, multiple falls with progressive difficulty ambulating over the last 7 days.  Patient reports he was unable to get up from the floor, denies loss of consciousness; and did not report striking his head.   In the ED, temperature 98.0 F, HR 90, RR 18, BP 130/80, SPO2 100% on room air.  Sodium 135, potassium 2.3, chloride 106, CO2 22, glucose 103, BUN 7, creatinine 1.25.  Lipase 64.  AST 42, ALT 25, total bilirubin 0.6.  CK 1613.  High sensitive troponin 33>25.  WBC 14.3, hemoglobin 14.3, platelets 475.  INR 0.9.  Urinalysis unrevealing.  CT head/C-spine with no acute intracranial abnormality, no calvarial fracture, no acute fracture or listhesis of the cervical spine.  Chest x-ray with minimal left basilar atelectasis versus scarring.  Patient received 3 L LR bolus, potassium supplementation and magnesium IV PB.  Hospitalist service consulted for admission for further evaluation and treatment of generalized weakness, mild rhabdomyolysis, orthostatic hypotension  Hospital course:  Rhabdomyolysis, mild Patient presenting to the ED following  multiple falls and generalized weakness.  Reports has not been unable to ambulate over the last several days.  CK level elevated 1613 on admission.  Patient was started on aggressive IV fluid hydration and was seen by PT and OT.  CK level trended down with resolution of symptoms.  Discharging to SNF for further rehabilitation.   Orthostatic hypotension/Hx POTS Patient was notably orthostatic upon ED presentation, likely secondary to vomit up lesion in the setting of dehydration as he has been unable to mobilize well over the last week.  Patient received 3 L LR bolus in the ED; followed by continuous IV fluids.  Repeat orthostatic vital signs now normalized. Continue midodrine 10 mg every morning, 5 mg at 1000am and 200pm. Continue to encourage increased oral intake.  Recommend intermittent monitoring of orthostatic vital signs.  Continue outpatient follow-up with cardiology, Dr. Tenny Craw.   Hypokalemia Hypomagnesemia Repleted during hospitalization.  Continue home potassium supplement.   Elevated troponin Troponin mildly elevated on admission, 33 followed by 25.  EKG without dynamic changes.  Etiology likely secondary to type II demand ischemia in the setting of dehydration/rhabdomyolysis as above.   Dysphagia RN reports choking episodes x2 yesterday and family members do report occasional choking episodes at home.  Seen by speech therapy with recommendation of regular diet with thin liquids, mild aspiration risk.  Bipolar disorder Continue risperidone 2 mg p.o. nightly, benztropine 1 mg p.o. nightly.  Outpatient follow-up with psychiatry.    Hx epilepsy Keppra 1500 mg p.o. twice daily, Lyrica.  Outpatient follow-up with neurology as scheduled.   Hyperlipidemia Atorvastatin 20 g p.o. daily   Tobacco use disorder Counseled on need for complete tobacco cessation.   Hx EtoH abuse Patient reports no alcohol use over the last week.  Counseled on need for complete cessation.   Essential  hypertension Now off of antihypertensives due to orthostatic hypotension, currently on midodrine.   History of BPH Tamsulosin 0.4 mg p.o. nightly   Weakness/debility/deconditioning: Discharging to [facility] for further rehabilitation.  Discharge Diagnoses:  Principal Problem:   Orthostatic hypotension Active Problems:   Hyperlipidemia   TOBACCO ABUSE   Epilepsy (HCC)   OSA (obstructive sleep apnea)   Hypokalemia   Essential hypertension   History of BPH   Rhabdomyolysis   Hypomagnesemia   Elevated troponin    Discharge Instructions  Discharge Instructions     Call MD for:  difficulty breathing, headache or visual disturbances   Complete by: As directed    Call MD for:  extreme fatigue   Complete by: As directed    Call MD for:  persistant dizziness or light-headedness   Complete by: As directed    Call MD for:  persistant nausea and vomiting   Complete by: As directed    Call MD for:  severe uncontrolled pain   Complete by: As directed    Call MD for:  temperature >100.4   Complete by: As directed    Diet - low sodium heart healthy   Complete by: As directed    Increase activity slowly   Complete by: As directed       Allergies as of 08/27/2022       Reactions   Cymbalta [duloxetine Hcl] Other (See Comments)   Hyponatremia    Erythromycin Other (See Comments)   Unknown reation   Hctz [hydrochlorothiazide] Other (See Comments)   Syncope   Inderal [propranolol] Other (See Comments)   Syncope        Medication List     STOP taking these medications    cloNIDine 0.1 MG tablet Commonly known as: CATAPRES   nicotine 7 mg/24hr patch Commonly known as: NICODERM CQ - dosed in mg/24 hr   ondansetron 4 MG disintegrating tablet Commonly known as: ZOFRAN-ODT   ondansetron 8 MG tablet Commonly known as: Zofran   QUEtiapine 50 MG tablet Commonly known as: SEROQUEL       TAKE these medications    acetaminophen 325 MG tablet Commonly  known as: TYLENOL Take 2 tablets (650 mg total) by mouth every 4 (four) hours as needed for mild pain (temp > 100.5). What changed:  when to take this reasons to take this   aspirin EC 81 MG tablet Take 81 mg by mouth daily.   atorvastatin 20 MG tablet Commonly known as: LIPITOR Take 1 tablet by mouth once daily   benztropine 1 MG tablet Commonly known as: COGENTIN Take 1 mg by mouth at bedtime.   busPIRone 5 MG tablet Commonly known as: BUSPAR Take 5 mg by mouth 2 (two) times daily.   cetirizine 10 MG tablet Commonly known as: ZYRTEC Take 10 mg by mouth daily.   citalopram 20 MG tablet Commonly known as: CELEXA Take 20 mg by mouth daily.   Ensure Take 237 mLs by mouth 2 (two) times daily as needed (low apetite). Chocolate   levETIRAcetam 750 MG tablet Commonly known as: KEPPRA Take 2 tablets (1,500 mg total) by mouth 2 (two) times daily.   midodrine 5 MG tablet Commonly known as: PROAMATINE TAKE 2 TABLETS BY MOUTH ONCE DAILY AT  6  AM,  THEN  1  TABLET  AT  10  AM  AND  1  TABLET  AT  2  PM What changed:  how much to take how to take this when to take this additional instructions   multivitamin tablet Take 1 tablet by mouth daily.   potassium chloride SA 20 MEQ tablet Commonly known as: KLOR-CON M Take 20 mEq by mouth daily.   pregabalin 75 MG capsule Commonly known as: LYRICA Take 1 capsule (75 mg total) by mouth 2 (two) times daily.   risperiDONE 2 MG tablet Commonly known as: RISPERDAL Take 2 mg by mouth at bedtime.   tamsulosin 0.4 MG Caps capsule Commonly known as: FLOMAX Take 0.4 mg by mouth daily after supper.   thiamine 250 MG tablet Take 250 mg by mouth daily.   traZODone 150 MG tablet Commonly known as: DESYREL Take 150 mg by mouth at bedtime.        Contact information for follow-up providers     Bess Kinds, MD. Schedule an appointment as soon as possible for a visit in 1 week(s).   Specialty: Family Medicine Contact  information: [address] [place] [address] [phone]         Pricilla Riffle, MD. Schedule an appointment as soon as possible for a visit.   Specialty: Cardiology Contact information: [address] [place] [address] [phone]              Contact information for after-discharge care     Destination     [facility] .   Service: Skilled Nursing Contact information: [address] [place] [address] [phone]                    Allergies  Allergen Reactions   Cymbalta [Duloxetine Hcl] Other (See Comments)    Hyponatremia    Erythromycin Other (See Comments)    Unknown reation   Hctz [Hydrochlorothiazide] Other (See Comments)    Syncope   Inderal [Propranolol] Other (See Comments)    Syncope     Consultations: none   Procedures/Studies: CT Head Wo Contrast  Result Date: 08/24/2022 CLINICAL DATA:  Head trauma, moderate-severe; Neck trauma, midline tenderness (Age 25-64y). Altered mental status, ataxia EXAM: CT HEAD WITHOUT CONTRAST CT CERVICAL SPINE WITHOUT CONTRAST TECHNIQUE: Multidetector CT imaging of the head and cervical spine was performed following the standard protocol without intravenous contrast. Multiplanar CT image reconstructions of the cervical spine were also generated. RADIATION DOSE REDUCTION: This exam was performed according to the departmental dose-optimization program which includes automated exposure control, adjustment of the mA and/or kV according to patient size and/or use of iterative reconstruction technique. COMPARISON:  06/12/2022 FINDINGS: CT HEAD FINDINGS Brain: Left frontotemporal craniotomy again noted. Previously noted pneumocephalus and residual left subdural hematoma have resolved. Mild dural thickening subjacent to the craniotomy flap. No acute intracranial hemorrhage or infarct. Focal encephalomalacia noted within the left frontal  lobe related to prior trauma and cortical contusion. Stable region of cortical encephalomalacia within the right temporoparietal cortex. No abnormal intra or extra-axial mass lesion or fluid collection. No abnormal mass effect or midline shift. Ventricular size is normal. Cerebellum is unremarkable. Vascular: No hyperdense vessel or unexpected calcification. Skull: No acute fracture. Sinuses/Orbits: No acute finding. Other: Mastoid air cells and middle ear cavities are clear CT CERVICAL SPINE FINDINGS Alignment: Normal. Skull base and vertebrae: Craniocervical alignment is normal. The atlantodental interval is not widened. No acute fracture of the cervical spine. Vertebral body height is preserved. Ankylosis of C5-C7 vertebral bodies again noted. Soft tissues and spinal canal: No prevertebral fluid or  swelling. No visible canal hematoma. Disc levels: Moderate to severe intervertebral disc space narrowing and endplate remodeling at C7-T1 again noted in keeping with changes of advanced degenerative disc disease. Milder degenerative changes noted at C3-C5 and T1-2. Prevertebral soft tissues are not thickened on sagittal reformats. Spinal canal is widely patent. Left hemilaminectomy has been performed at C5-6 and C6-7 multilevel facet arthrosis results in mild to moderate multilevel neuroforaminal narrowing, most severe bilaterally at C4-5 and C5-6. Upper chest: Negative. Other: None IMPRESSION: 1. No acute intracranial abnormality. No calvarial fracture. 2. No acute fracture or listhesis of the cervical spine. Electronically Signed   By: Helyn Numbers M.D.   On: 08/24/2022 02:06   CT Cervical Spine Wo Contrast  Result Date: 08/24/2022 CLINICAL DATA:  Head trauma, moderate-severe; Neck trauma, midline tenderness (Age 31-64y). Altered mental status, ataxia EXAM: CT HEAD WITHOUT CONTRAST CT CERVICAL SPINE WITHOUT CONTRAST TECHNIQUE: Multidetector CT imaging of the head and cervical spine was performed following the  standard protocol without intravenous contrast. Multiplanar CT image reconstructions of the cervical spine were also generated. RADIATION DOSE REDUCTION: This exam was performed according to the departmental dose-optimization program which includes automated exposure control, adjustment of the mA and/or kV according to patient size and/or use of iterative reconstruction technique. COMPARISON:  06/12/2022 FINDINGS: CT HEAD FINDINGS Brain: Left frontotemporal craniotomy again noted. Previously noted pneumocephalus and residual left subdural hematoma have resolved. Mild dural thickening subjacent to the craniotomy flap. No acute intracranial hemorrhage or infarct. Focal encephalomalacia noted within the left frontal lobe related to prior trauma and cortical contusion. Stable region of cortical encephalomalacia within the right temporoparietal cortex. No abnormal intra or extra-axial mass lesion or fluid collection. No abnormal mass effect or midline shift. Ventricular size is normal. Cerebellum is unremarkable. Vascular: No hyperdense vessel or unexpected calcification. Skull: No acute fracture. Sinuses/Orbits: No acute finding. Other: Mastoid air cells and middle ear cavities are clear CT CERVICAL SPINE FINDINGS Alignment: Normal. Skull base and vertebrae: Craniocervical alignment is normal. The atlantodental interval is not widened. No acute fracture of the cervical spine. Vertebral body height is preserved. Ankylosis of C5-C7 vertebral bodies again noted. Soft tissues and spinal canal: No prevertebral fluid or swelling. No visible canal hematoma. Disc levels: Moderate to severe intervertebral disc space narrowing and endplate remodeling at C7-T1 again noted in keeping with changes of advanced degenerative disc disease. Milder degenerative changes noted at C3-C5 and T1-2. Prevertebral soft tissues are not thickened on sagittal reformats. Spinal canal is widely patent. Left hemilaminectomy has been performed at C5-6  and C6-7 multilevel facet arthrosis results in mild to moderate multilevel neuroforaminal narrowing, most severe bilaterally at C4-5 and C5-6. Upper chest: Negative. Other: None IMPRESSION: 1. No acute intracranial abnormality. No calvarial fracture. 2. No acute fracture or listhesis of the cervical spine. Electronically Signed   By: Helyn Numbers M.D.   On: 08/24/2022 02:06   DG Chest Port 1 View  Result Date: 08/24/2022 CLINICAL DATA:  Sepsis EXAM: PORTABLE CHEST 1 VIEW COMPARISON:  08/10/2022 FINDINGS: Minimal left basilar atelectasis or scarring. Lungs are otherwise clear. No pneumothorax or pleural effusion. Cardiac size within normal limits. Pulmonary vascularity is normal. Multiple remote left rib fractures again noted. No acute bone abnormality. IMPRESSION: Minimal left basilar atelectasis or scarring. Electronically Signed   By: Helyn Numbers M.D.   On: 08/24/2022 01:23   CT ABDOMEN PELVIS W CONTRAST  Result Date: 08/10/2022 CLINICAL DATA:  Nausea, vomiting and abdominal pain. EXAM: CT ABDOMEN AND PELVIS  WITH CONTRAST TECHNIQUE: Multidetector CT imaging of the abdomen and pelvis was performed using the standard protocol following bolus administration of intravenous contrast. RADIATION DOSE REDUCTION: This exam was performed according to the departmental dose-optimization program which includes automated exposure control, adjustment of the mA and/or kV according to patient size and/or use of iterative reconstruction technique. CONTRAST:  100mL OMNIPAQUE IOHEXOL 350 MG/ML SOLN COMPARISON:  01/28/2016 FINDINGS: Lower chest: No acute abnormality. Hepatobiliary: No focal liver abnormality is seen. No gallstones, gallbladder wall thickening, or biliary dilatation. Pancreas: Unremarkable. No pancreatic ductal dilatation or surrounding inflammatory changes. Spleen: Normal in size without focal abnormality. Adrenals/Urinary Tract: Normal adrenal glands. Bilateral Bosniak class 1 and 2 kidney cysts  identified. The largest cyst is Bosniak class 1 arising off the inferior pole of the right kidney measuring 2.3 cm, image 37/2. No follow-up imaging recommended. No signs of nephrolithiasis or hydronephrosis. Urinary bladder unremarkable. Stomach/Bowel: The stomach appears normal. The appendix is not confidently identified separate from right lower quadrant bowel loops. No signs of pericecal inflammation. No abnormal bowel wall thickening, inflammation or distension. Liquid stool identified within the colon with scattered air-fluid levels. Vascular/Lymphatic: Aortic atherosclerosis. No aneurysm. No signs of abdominopelvic adenopathy. Reproductive: Uterus and bilateral adnexa are unremarkable. Other: There is no free fluid or fluid collections. No signs of pneumoperitoneum. Musculoskeletal: Previous ORIF of the right femur. Multiple remote healed left posterior and lateral rib fractures. IMPRESSION: 1. No acute findings within the abdomen or pelvis. 2. Liquid stool identified within the colon with scattered air-fluid levels, which may reflect diarrheal state. 3. Aortic Atherosclerosis (ICD10-I70.0). Electronically Signed   By: Signa Kellaylor  Stroud M.D.   On: 08/10/2022 10:37   CT Angio Chest PE W and/or Wo Contrast  Result Date: 08/10/2022 CLINICAL DATA:  Fatigue nausea vomiting diarrhea. EXAM: CT ANGIOGRAPHY CHEST WITH CONTRAST TECHNIQUE: Multidetector CT imaging of the chest was performed using the standard protocol during bolus administration of intravenous contrast. Multiplanar CT image reconstructions and MIPs were obtained to evaluate the vascular anatomy. RADIATION DOSE REDUCTION: This exam was performed according to the departmental dose-optimization program which includes automated exposure control, adjustment of the mA and/or kV according to patient size and/or use of iterative reconstruction technique. CONTRAST:  100mL OMNIPAQUE IOHEXOL 350 MG/ML SOLN COMPARISON:  None Available. FINDINGS: Cardiovascular:  Satisfactory opacification of the pulmonary arteries to the segmental level. No evidence of pulmonary embolism. Normal heart size. No pericardial effusion. Mediastinum/Nodes: No enlarged mediastinal, hilar, or axillary lymph nodes. Thyroid gland, trachea, and esophagus demonstrate no significant findings. Lungs/Pleura: Lungs are clear. No pleural effusion or pneumothorax. Mild centrilobular and paraseptal emphysema. Upper Abdomen: See separately dictated CT abdomen and pelvis for additional findings. Musculoskeletal: No chest wall abnormality. No acute or significant osseous findings. Review of the MIP images confirms the above findings. IMPRESSION: Negative for acute PE. Electronically Signed   By: Lorenza CambridgeHemant  Desai M.D.   On: 08/10/2022 10:32   DG Chest 1 View  Result Date: 08/10/2022 CLINICAL DATA:  66 year old male with history of shortness of breath. Positive COVID test. Fatigue, nausea and vomiting. EXAM: CHEST  1 VIEW COMPARISON:  Chest x-ray 06/07/2022. FINDINGS: Lung volumes are low. No consolidative airspace disease. No pleural effusions. No pneumothorax. No pulmonary nodule or mass noted. Pulmonary vasculature and the cardiomediastinal silhouette are within normal limits. Multiple old healed bilateral rib fractures. IMPRESSION: 1. Low lung volumes without radiographic evidence of acute cardiopulmonary disease. Electronically Signed   By: Trudie Reedaniel  Entrikin M.D.   On: 08/10/2022 07:46  Subjective: Patient seen examined at bedside, resting calmly.  Lying in bed.  Just finished breakfast.  No specific complaints this morning.  Discharging to SNF for further rehabilitation today.  Denies headache, no dizziness, no chest pain, no palpitations, no fever/chills/night sweats, no nausea/vomiting/diarrhea, no focal weakness, no fatigue, no paresthesias, no cough/congestion, no abdominal pain.  No acute events overnight per nursing staff.  Discharge Exam: Vitals:   08/27/22 0446 08/27/22 0530  BP: (!)  150/125 (!) 173/75  Pulse: 72 76  Resp: 18 20  Temp: 97.6 F (36.4 C) 98.1 F (36.7 C)  SpO2: 98% 96%   Vitals:   08/26/22 1958 08/27/22 0446 08/27/22 0500 08/27/22 0530  BP: (!) 147/74 (!) 150/125  (!) 173/75  Pulse: 76 72  76  Resp: 20 18  20   Temp: 98 F (36.7 C) 97.6 F (36.4 C)  98.1 F (36.7 C)  TempSrc: Oral   Oral  SpO2:  98%  96%  Weight:   75.5 kg   Height:        Physical Exam: GEN: NAD, alert and oriented x 3, chronically ill in appearance, appears older than stated age HEENT: NCAT, PERRL, EOMI, sclera clear, MMM PULM: CTAB w/o wheezes/crackles, normal respiratory effort, on room air CV: RRR w/o M/G/R GI: abd soft, NTND, NABS, no R/G/M MSK: no peripheral edema, muscle strength globally intact 5/5 bilateral upper/lower extremities NEURO: CN II-XII intact, no focal deficits, sensation to light touch intact PSYCH: Depressed mood, flat affect Integumentary: dry/intact, no rashes or wounds    The results of significant diagnostics from this hospitalization (including imaging, microbiology, ancillary and laboratory) are listed below for reference.     Microbiology: No results found for this or any previous visit (from the past 240 hour(s)).   Labs: BNP (last 3 results) No results for input(s): "BNP" in the last 8760 hours. Basic Metabolic Panel: Recent Labs  Lab 08/24/22 0045 08/24/22 0126 08/24/22 0345 08/25/22 0500 08/26/22 0506 08/27/22 0507  NA 135 138  --  138 138 140  K 2.3* 3.0*  --  3.4* 4.0 4.3  CL 106 102  --  112* 111 109  CO2 22  --   --  20* 23 27  GLUCOSE 103* 102*  --  90 101* 103*  BUN 7* 5*  --  8 7* 7*  CREATININE 1.25* 1.10  --  0.78 0.85 0.70  CALCIUM 9.2  --   --  7.9* 8.1* 8.3*  MG  --   --  1.2* 1.6* 1.7 1.6*   Liver Function Tests: Recent Labs  Lab 08/24/22 0045 08/25/22 0500  AST 42* 26  ALT 25 19  ALKPHOS 113 75  BILITOT 0.6 0.4  PROT 7.3 5.2*  ALBUMIN 4.2 2.7*   Recent Labs  Lab 08/24/22 0045  LIPASE 64*    No results for input(s): "AMMONIA" in the last 168 hours. CBC: Recent Labs  Lab 08/24/22 0045 08/24/22 0126 08/25/22 0500  WBC 14.3*  --  9.4  NEUTROABS 10.4*  --   --   HGB 14.3 12.9* 11.2*  HCT 42.7 38.0* 34.6*  MCV 82.0  --  84.4  PLT 475*  --  330   Cardiac Enzymes: Recent Labs  Lab 08/24/22 0045 08/25/22 0500 08/26/22 0506  CKTOTAL 1,613* 827* 773*   BNP: Invalid input(s): "POCBNP" CBG: No results for input(s): "GLUCAP" in the last 168 hours. D-Dimer No results for input(s): "DDIMER" in the last 72 hours. Hgb A1c No results for input(s): "  HGBA1C" in the last 72 hours. Lipid Profile No results for input(s): "CHOL", "HDL", "LDLCALC", "TRIG", "CHOLHDL", "LDLDIRECT" in the last 72 hours. Thyroid function studies No results for input(s): "TSH", "T4TOTAL", "T3FREE", "THYROIDAB" in the last 72 hours.  Invalid input(s): "FREET3" Anemia work up No results for input(s): "VITAMINB12", "FOLATE", "FERRITIN", "TIBC", "IRON", "RETICCTPCT" in the last 72 hours. Urinalysis    Component Value Date/Time   COLORURINE YELLOW 08/24/2022 0345   APPEARANCEUR CLEAR 08/24/2022 0345   LABSPEC 1.005 08/24/2022 0345   PHURINE 7.0 08/24/2022 0345   GLUCOSEU NEGATIVE 08/24/2022 0345   GLUCOSEU NEGATIVE 12/03/2013 1140   HGBUR NEGATIVE 08/24/2022 0345   BILIRUBINUR NEGATIVE 08/24/2022 0345   KETONESUR NEGATIVE 08/24/2022 0345   PROTEINUR NEGATIVE 08/24/2022 0345   UROBILINOGEN 0.2 12/03/2013 1140   NITRITE NEGATIVE 08/24/2022 0345   LEUKOCYTESUR NEGATIVE 08/24/2022 0345   Sepsis Labs Recent Labs  Lab 08/24/22 0045 08/25/22 0500  WBC 14.3* 9.4   Microbiology No results found for this or any previous visit (from the past 240 hour(s)).   Time coordinating discharge: Over 30 minutes  SIGNED:   Alvira Philips [place], DO  [facility] 08/27/2022, 8:44 AM

## 2022-08-27 NOTE — Progress Notes (Signed)
Called [facility] to give report, operator states nurse cant come to phone right now. Notified them that PTAR just left with pt. Gave them my number and operator states the nurse will call me back for report.

## 2022-08-30 DIAGNOSIS — F32A Depression, unspecified: Secondary | ICD-10-CM | POA: Diagnosis not present

## 2022-08-30 DIAGNOSIS — I10 Essential (primary) hypertension: Secondary | ICD-10-CM | POA: Diagnosis not present

## 2022-08-30 DIAGNOSIS — S065X0D Traumatic subdural hemorrhage without loss of consciousness, subsequent encounter: Secondary | ICD-10-CM | POA: Diagnosis not present

## 2022-08-30 DIAGNOSIS — M6282 Rhabdomyolysis: Secondary | ICD-10-CM | POA: Diagnosis not present

## 2022-08-30 DIAGNOSIS — E782 Mixed hyperlipidemia: Secondary | ICD-10-CM | POA: Diagnosis not present

## 2022-08-30 DIAGNOSIS — N183 Chronic kidney disease, stage 3 unspecified: Secondary | ICD-10-CM | POA: Diagnosis not present

## 2022-08-30 DIAGNOSIS — I951 Orthostatic hypotension: Secondary | ICD-10-CM | POA: Diagnosis not present

## 2022-08-30 DIAGNOSIS — R569 Unspecified convulsions: Secondary | ICD-10-CM | POA: Diagnosis not present

## 2022-08-30 DIAGNOSIS — J309 Allergic rhinitis, unspecified: Secondary | ICD-10-CM | POA: Diagnosis not present

## 2022-08-31 ENCOUNTER — Other Ambulatory Visit: Payer: Self-pay | Admitting: *Deleted

## 2022-08-31 NOTE — Patient Outreach (Signed)
[name] resides in [facility] per [facility]. Screening for potential [facility] care coordination services as benefit of insurance plan and PCP.   Secure communication sent to [facility] SW to make aware writer is following transition plans and for potential [facility] needs.   Marthenia Rolling, MSN, RN,BSN [place] [facility] Coordinator [phone] (Direct dial)

## 2022-09-01 DIAGNOSIS — I10 Essential (primary) hypertension: Secondary | ICD-10-CM | POA: Diagnosis not present

## 2022-09-01 DIAGNOSIS — I951 Orthostatic hypotension: Secondary | ICD-10-CM | POA: Diagnosis not present

## 2022-09-01 DIAGNOSIS — R059 Cough, unspecified: Secondary | ICD-10-CM | POA: Diagnosis not present

## 2022-09-01 DIAGNOSIS — R1310 Dysphagia, unspecified: Secondary | ICD-10-CM | POA: Diagnosis not present

## 2022-09-03 DIAGNOSIS — W19XXXA Unspecified fall, initial encounter: Secondary | ICD-10-CM | POA: Diagnosis not present

## 2022-09-03 DIAGNOSIS — R569 Unspecified convulsions: Secondary | ICD-10-CM | POA: Diagnosis not present

## 2022-09-03 DIAGNOSIS — M6282 Rhabdomyolysis: Secondary | ICD-10-CM | POA: Diagnosis not present

## 2022-09-07 ENCOUNTER — Other Ambulatory Visit: Payer: Self-pay | Admitting: *Deleted

## 2022-09-07 DIAGNOSIS — I10 Essential (primary) hypertension: Secondary | ICD-10-CM | POA: Diagnosis not present

## 2022-09-07 DIAGNOSIS — F32A Depression, unspecified: Secondary | ICD-10-CM | POA: Diagnosis not present

## 2022-09-07 DIAGNOSIS — E440 Moderate protein-calorie malnutrition: Secondary | ICD-10-CM | POA: Diagnosis not present

## 2022-09-07 NOTE — Patient Outreach (Addendum)
[place] Coordinator follow up. Jose Li resides at [facility]. Screening for potential [facility] care coordination services as benefit of insurance plan and PCP.   Facility site visit to [facility] earlier today. Met with Jose Li at bedside. Discussed [facility] care coordination services. He is agreeable. States he lives alone. States his friends and girlfriend takes him to MD appointments. Advised Jose Li to contact [facility] customer service line on back of his insurance card to find out whether he has transportation and meal benefit. Jose Li is agreeable to doing so. States he still cooks his own meals or eats microwaveable meals.   Jose Li states he has mobile phone is not working. States he has to get a new one.  Reports he can be reached on his home phone  at [phone].  Will plan to make referral to [facility] care coordination team upon SNF discharge.   SNF SW unavailable to discuss home health arrangements. Will follow up.    Marthenia Rolling, MSN, RN,BSN [place] Acute Care Coordinator [phone] (Direct dial)

## 2022-09-10 ENCOUNTER — Other Ambulatory Visit: Payer: Self-pay | Admitting: *Deleted

## 2022-09-10 DIAGNOSIS — I10 Essential (primary) hypertension: Secondary | ICD-10-CM

## 2022-09-10 NOTE — Patient Outreach (Signed)
[facility] -Ac[facility] Care Coordinator follow up. Verified in [facility] Mr. Abshier discharged from [facility] on 09/09/22.   Voice message left and secure email sent to SNF SW to inquire about home health agency.   Mr. Calvin previously agreeable to [facility] follow up. Could benefit from [facility] care coordination services.   Will make referral to [facility] care coordination team. Please see writer's notes from 09/07/22 for further details.   Marthenia Rolling, MSN, RN,BSN [place] Acute Care Coordinator [phone] (Direct dial)

## 2022-09-13 ENCOUNTER — Telehealth: Payer: Self-pay | Admitting: *Deleted

## 2022-09-13 NOTE — Chronic Care Management (AMB) (Signed)
  Care Coordination   Note   09/13/2022 Name: [name] MRN: [id number] DOB: 1956/11/14  Jose Li is a 66 y.o. year old male who sees Holley Bouche, MD for primary care. I reached out to Jose Li by phone today to offer care coordination services.  [name] was given information about Care Coordination services today including:   The Care Coordination services include support from the care team which includes your Nurse Coordinator, Clinical Social Worker, or Pharmacist.  The Care Coordination team is here to help remove barriers to the health concerns and goals most important to you. Care Coordination services are voluntary, and the patient may decline or stop services at any time by request to their care team member.   Care Coordination Consent Status: Patient agreed to services and verbal consent obtained.   Follow up plan:  Telephone appointment with care coordination team member scheduled for:  09/16/22  Encounter Outcome:  Pt. Scheduled  [place]  Direct Dial: [phone]

## 2022-09-14 DIAGNOSIS — R1310 Dysphagia, unspecified: Secondary | ICD-10-CM | POA: Diagnosis not present

## 2022-09-14 DIAGNOSIS — Z9181 History of falling: Secondary | ICD-10-CM | POA: Diagnosis not present

## 2022-09-14 DIAGNOSIS — J302 Other seasonal allergic rhinitis: Secondary | ICD-10-CM | POA: Diagnosis not present

## 2022-09-14 DIAGNOSIS — E785 Hyperlipidemia, unspecified: Secondary | ICD-10-CM | POA: Diagnosis not present

## 2022-09-14 DIAGNOSIS — G4733 Obstructive sleep apnea (adult) (pediatric): Secondary | ICD-10-CM | POA: Diagnosis not present

## 2022-09-14 DIAGNOSIS — I10 Essential (primary) hypertension: Secondary | ICD-10-CM | POA: Diagnosis not present

## 2022-09-14 DIAGNOSIS — D649 Anemia, unspecified: Secondary | ICD-10-CM | POA: Diagnosis not present

## 2022-09-14 DIAGNOSIS — F17210 Nicotine dependence, cigarettes, uncomplicated: Secondary | ICD-10-CM | POA: Diagnosis not present

## 2022-09-14 DIAGNOSIS — M6282 Rhabdomyolysis: Secondary | ICD-10-CM | POA: Diagnosis not present

## 2022-09-14 DIAGNOSIS — I951 Orthostatic hypotension: Secondary | ICD-10-CM | POA: Diagnosis not present

## 2022-09-15 ENCOUNTER — Inpatient Hospital Stay: Payer: Medicare Other | Admitting: Student

## 2022-09-16 ENCOUNTER — Ambulatory Visit: Payer: Self-pay

## 2022-09-16 NOTE — Patient Instructions (Signed)
Visit Information  Thank you for taking time to visit with me today. Please don't hesitate to contact me if I can be of assistance to you.   Following are the goals we discussed today:   Goals Addressed             This Visit's Progress    To manage andmonitor his loe blood pressure and falls       Care Coordination Interventions: Active listening / Reflection utilized  Emotional Support Provided Problem [place] strategies reviewed Reviewed medications and discussed potential side effects of medications such as dizziness and frequent urination Advised patient of importance of notifying provider of falls Assessed for falls since last encounter Assessed patients knowledge of fall risk prevention secondary to previously provided education Evaluation of current treatment plan related to hypotension self management and patient's adherence to plan as established by provider Reviewed medications with patient and discussed importance of compliance Discussed plans with patient for ongoing care management follow up and provided patient with direct contact information for care management team Reviewed scheduled/upcoming provider appointments including: 09/20/22 Dr. Marcina Millard Today, I spoke with Mr. Alcorn who reported experiencing low blood pressure and dizziness when standing up, which has caused him to fall multiple times. He is currently taking medication to manage his condition, and we discussed fall precautions as well as the proper protocol for sitting and standing before walking. Mr. Bulson informed me that he is diligent about taking his medication and attending all of his appointments. His girlfriend is also helping him by providing support and transportation. He has a scheduled appointment on 09/20/22 with Dr. Marcina Millard for hospital follow-up, and he has requested that I follow up with him as well.         Our next appointment is by telephone on 12/5 at 230 pm  Please call the  care guide team at [phone] if you need to cancel or reschedule your appointment.   If you are experiencing a Mental Health or [place] or need someone to talk to, please call 1-800-273-TALK (toll free, 24 hour hotline)  Patient verbalizes understanding of instructions and care plan provided today and agrees to view in [place]. Active MyChart status and patient understanding of how to access instructions and care plan via MyChart confirmed with patient.     Lazaro Arms RN, BSN, [place] [facility]   Phone: [phone]

## 2022-09-16 NOTE — Patient Outreach (Signed)
  Care Coordination   Initial Visit Note   09/16/2022 Name: Jose Li MRN: [id number] DOB: [birth year]  Jose Li is a 66 y.o. year old male who sees Jose Bouche, MD for primary care. I spoke with  Jose Li by phone today.  What matters to the patients health and wellness today?  I have low blood pressure.    Goals Addressed             This Visit's Progress    To manage andmonitor his loe blood pressure and falls       Care Coordination Interventions: Active listening / Reflection utilized  Emotional Support Provided Problem Jose Li strategies reviewed Reviewed medications and discussed potential side effects of medications such as dizziness and frequent urination Advised patient of importance of notifying provider of falls Assessed for falls since last encounter Assessed patients knowledge of fall risk prevention secondary to previously provided education Evaluation of current treatment plan related to hypotension self management and patient's adherence to plan as established by provider Reviewed medications with patient and discussed importance of compliance Discussed plans with patient for ongoing care management follow up and provided patient with direct contact information for care management team Reviewed scheduled/upcoming provider appointments including: 09/20/22 Jose Li Today, I spoke with Jose Li who reported experiencing low blood pressure and dizziness when standing up, which has caused him to fall multiple times. He is currently taking medication to manage his condition, and we discussed fall precautions as well as the proper protocol for sitting and standing before walking. Jose Li informed me that he is diligent about taking his medication and attending all of his appointments. His girlfriend is also helping him by providing support and transportation. He has a scheduled appointment on 09/20/22 with Jose Li for hospital follow-up, and he  has requested that I follow up with him as well.         SDOH assessments and interventions completed:  Yes  SDOH Interventions Today    Flowsheet Row Most Recent Value  SDOH Interventions   Food Insecurity Interventions Intervention Not Indicated  Transportation Interventions Intervention Not Indicated        Care Coordination Interventions Activated:  Yes  Care Coordination Interventions:  Yes, provided   Follow up plan: Follow up call scheduled for 12/5 at 230 pm    Encounter Outcome:  Pt. Visit Completed   Jose Arms RN, BSN, [place] [facility]   Phone: [phone]

## 2022-09-17 ENCOUNTER — Telehealth: Payer: Self-pay | Admitting: *Deleted

## 2022-09-17 NOTE — Telephone Encounter (Signed)
Joelene Millin with [place] home health states that they did their nursing assessment and patient will not need their services.  OT/PT are next on the list to come out and evalute the patient.  Will forward to MD as an FYI.  Christi Wirick,CMA

## 2022-09-20 ENCOUNTER — Ambulatory Visit (INDEPENDENT_AMBULATORY_CARE_PROVIDER_SITE_OTHER): Payer: Medicare Other | Admitting: Student

## 2022-09-20 ENCOUNTER — Encounter: Payer: Self-pay | Admitting: Student

## 2022-09-20 VITALS — BP 124/62 | HR 122 | Ht 71.0 in | Wt 163.8 lb

## 2022-09-20 DIAGNOSIS — E876 Hypokalemia: Secondary | ICD-10-CM

## 2022-09-20 DIAGNOSIS — Z1211 Encounter for screening for malignant neoplasm of colon: Secondary | ICD-10-CM

## 2022-09-20 DIAGNOSIS — Z09 Encounter for follow-up examination after completed treatment for conditions other than malignant neoplasm: Secondary | ICD-10-CM

## 2022-09-20 NOTE — Progress Notes (Signed)
  SUBJECTIVE:   CHIEF COMPLAINT / HPI:   Hospital f/u  Patient admitted 10/10 for orthostatic hypotension, mild rhabdo, and hypokalemia.  Appreciates has had one episode of dizziness that lasted 5 min and went away on it's own, denies any LOC, at random while at rest. Reports he's been taking his time to get up and has also been working on hydration. Taking midodrine as prescribed. Has appointment with Cardiology in 10/26/22. Patient reports he's still taking midodrine. Patient denies any chest pain or SOB.   Reports he is otherwise feeling well and has no complaints. Has not had any falls or episodes of hypotension.  PERTINENT  PMH / PSH: Orthostatic BP, POTS    OBJECTIVE:  BP 124/62   Pulse (!) 122   Ht 5\' 11"  (1.803 m)   Wt 163 lb 12.8 oz (74.3 kg)   SpO2 97%   BMI 22.85 kg/m  Physical Exam Constitutional:      Appearance: He is normal weight.  Cardiovascular:     Rate and Rhythm: Normal rate and regular rhythm.     Pulses: Normal pulses.     Heart sounds: Normal heart sounds. No murmur heard.    No friction rub. No gallop.  Pulmonary:     Effort: Pulmonary effort is normal. No respiratory distress.     Breath sounds: Normal breath sounds. No stridor. No wheezing or rhonchi.  Abdominal:     General: Abdomen is flat. Bowel sounds are normal. There is no distension.     Palpations: Abdomen is soft. There is no mass.     Tenderness: There is no abdominal tenderness. There is no guarding.     Hernia: No hernia is present.  Skin:    Capillary Refill: Capillary refill takes less than 2 seconds.  Neurological:     Mental Status: He is alert.      ASSESSMENT/PLAN:  Hypokalemia -     Basic metabolic panel  Colon cancer screening -     Ambulatory referral to Gastroenterology  Hospital discharge follow-up Assessment & Plan: Patient presents for hospital f/u after being admitted for orthostatic syncope. Patient doing well and has no complaints. Reports he's had one  episode of dizziness at rest, but denies any episodes of hypotension/LOC. Patient has f/u with cardiology in December. Will check BMP as patient w/ hypokalemia during admission.  -F/u w/ Cards -Continue midodrine  -BMP    No follow-ups on file. Holley Bouche, MD 09/23/2022, 5:45 PM PGY-2, [place]

## 2022-09-20 NOTE — Patient Instructions (Signed)
It was great to see you! Thank you for allowing me to participate in your care!  I recommend that you always bring your medications to each appointment as this makes it easy to ensure we are on the correct medications and helps [place] not miss when refills are needed.  Our plans for today:  - Have you follow up with Cardiology - Check your electrolyte levels - Follow up as needed.   We are checking some labs today, I will call you if they are abnormal will send you a MyChart message or a letter if they are normal.  If you do not hear about your labs in the next 2 weeks please let us know.  Take care and seek immediate care sooner if you develop any concerns.   Dr. Holley Bouche, MD [place]

## 2022-09-22 LAB — BASIC METABOLIC PANEL
BUN/Creatinine Ratio: 10 (ref 10–24)
BUN: 12 mg/dL (ref 8–27)
CO2: 19 mmol/L — ABNORMAL LOW (ref 20–29)
Calcium: 9.8 mg/dL (ref 8.6–10.2)
Chloride: 107 mmol/L — ABNORMAL HIGH (ref 96–106)
Creatinine, Ser: 1.25 mg/dL (ref 0.76–1.27)
Glucose: 88 mg/dL (ref 70–99)
Potassium: 5 mmol/L (ref 3.5–5.2)
Sodium: 142 mmol/L (ref 134–144)
eGFR: 64 mL/min/{1.73_m2} (ref >59)

## 2022-09-23 ENCOUNTER — Encounter: Payer: Self-pay | Admitting: Student

## 2022-09-23 DIAGNOSIS — Z09 Encounter for follow-up examination after completed treatment for conditions other than malignant neoplasm: Secondary | ICD-10-CM | POA: Insufficient documentation

## 2022-09-23 NOTE — Assessment & Plan Note (Addendum)
Patient presents for hospital f/u after being admitted for orthostatic syncope. Patient doing well and has no complaints. Reports he's had one episode of dizziness at rest, but denies any episodes of hypotension/LOC. Patient has f/u with cardiology in December. Will check BMP as patient w/ hypokalemia during admission.  -F/u w/ Cards -Continue midodrine  -BMP

## 2022-10-06 ENCOUNTER — Telehealth: Payer: Self-pay

## 2022-10-06 NOTE — Telephone Encounter (Signed)
Received VM from Arlys John at [facility] requesting verbal orders for PT and OT evaluation.   Returned call to [place], LVM providing verbal orders for PT/OT evaluation.   Veronda Prude, RN

## 2022-10-19 ENCOUNTER — Ambulatory Visit: Payer: Self-pay

## 2022-10-19 ENCOUNTER — Telehealth: Payer: Self-pay

## 2022-10-19 DIAGNOSIS — Z8669 Personal history of other diseases of the nervous system and sense organs: Secondary | ICD-10-CM | POA: Diagnosis not present

## 2022-10-19 DIAGNOSIS — R1310 Dysphagia, unspecified: Secondary | ICD-10-CM | POA: Diagnosis not present

## 2022-10-19 DIAGNOSIS — G4733 Obstructive sleep apnea (adult) (pediatric): Secondary | ICD-10-CM | POA: Diagnosis not present

## 2022-10-19 DIAGNOSIS — E785 Hyperlipidemia, unspecified: Secondary | ICD-10-CM | POA: Diagnosis not present

## 2022-10-19 DIAGNOSIS — Z9181 History of falling: Secondary | ICD-10-CM | POA: Diagnosis not present

## 2022-10-19 DIAGNOSIS — J302 Other seasonal allergic rhinitis: Secondary | ICD-10-CM | POA: Diagnosis not present

## 2022-10-19 DIAGNOSIS — E876 Hypokalemia: Secondary | ICD-10-CM | POA: Diagnosis not present

## 2022-10-19 DIAGNOSIS — Z87891 Personal history of nicotine dependence: Secondary | ICD-10-CM | POA: Diagnosis not present

## 2022-10-19 DIAGNOSIS — M6282 Rhabdomyolysis: Secondary | ICD-10-CM | POA: Diagnosis not present

## 2022-10-19 DIAGNOSIS — I10 Essential (primary) hypertension: Secondary | ICD-10-CM | POA: Diagnosis not present

## 2022-10-19 DIAGNOSIS — I951 Orthostatic hypotension: Secondary | ICD-10-CM | POA: Diagnosis not present

## 2022-10-19 DIAGNOSIS — Z9981 Dependence on supplemental oxygen: Secondary | ICD-10-CM | POA: Diagnosis not present

## 2022-10-19 DIAGNOSIS — D649 Anemia, unspecified: Secondary | ICD-10-CM | POA: Diagnosis not present

## 2022-10-19 DIAGNOSIS — Z7982 Long term (current) use of aspirin: Secondary | ICD-10-CM | POA: Diagnosis not present

## 2022-10-19 NOTE — Telephone Encounter (Signed)
Jose Li [facility] PT calls nurse line requesting verbal order for [facility] PT as follows.   2x a week for 4 weeks.   Verbal order given.

## 2022-10-21 DIAGNOSIS — D649 Anemia, unspecified: Secondary | ICD-10-CM | POA: Diagnosis not present

## 2022-10-21 DIAGNOSIS — E785 Hyperlipidemia, unspecified: Secondary | ICD-10-CM | POA: Diagnosis not present

## 2022-10-21 DIAGNOSIS — Z7982 Long term (current) use of aspirin: Secondary | ICD-10-CM | POA: Diagnosis not present

## 2022-10-21 DIAGNOSIS — J302 Other seasonal allergic rhinitis: Secondary | ICD-10-CM | POA: Diagnosis not present

## 2022-10-21 DIAGNOSIS — Z9981 Dependence on supplemental oxygen: Secondary | ICD-10-CM | POA: Diagnosis not present

## 2022-10-21 DIAGNOSIS — I10 Essential (primary) hypertension: Secondary | ICD-10-CM | POA: Diagnosis not present

## 2022-10-21 DIAGNOSIS — R1310 Dysphagia, unspecified: Secondary | ICD-10-CM | POA: Diagnosis not present

## 2022-10-21 DIAGNOSIS — Z8669 Personal history of other diseases of the nervous system and sense organs: Secondary | ICD-10-CM | POA: Diagnosis not present

## 2022-10-21 DIAGNOSIS — E876 Hypokalemia: Secondary | ICD-10-CM | POA: Diagnosis not present

## 2022-10-21 DIAGNOSIS — I951 Orthostatic hypotension: Secondary | ICD-10-CM | POA: Diagnosis not present

## 2022-10-21 DIAGNOSIS — Z87891 Personal history of nicotine dependence: Secondary | ICD-10-CM | POA: Diagnosis not present

## 2022-10-21 DIAGNOSIS — M6282 Rhabdomyolysis: Secondary | ICD-10-CM | POA: Diagnosis not present

## 2022-10-21 DIAGNOSIS — G4733 Obstructive sleep apnea (adult) (pediatric): Secondary | ICD-10-CM | POA: Diagnosis not present

## 2022-10-21 DIAGNOSIS — Z9181 History of falling: Secondary | ICD-10-CM | POA: Diagnosis not present

## 2022-10-21 NOTE — Patient Instructions (Signed)
Visit Information  Thank you for taking time to visit with me today. Please don't hesitate to contact me if I can be of assistance to you.   Following are the goals we discussed today:   Goals Addressed             This Visit's Progress    To manage andmonitor his low blood pressure and falls       Care Coordination Interventions: Active listening / Reflection utilized  Emotional Support Provided Problem Solving /Task Center strategies reviewed Reviewed medications and discussed potential side effects of medications such as dizziness and frequent urination Advised patient of importance of notifying provider of falls Assessed for falls since last encounter Assessed patients knowledge of fall risk prevention secondary to previously provided education Evaluation of current treatment plan related to hypotension self management and patient's adherence to plan as established by provider Reviewed medications with patient and discussed importance of compliance Discussed plans with patient for ongoing care management follow up and provided patient with direct contact information for care management team Reviewed scheduled/upcoming provider appointments          Our next appointment is by telephone on 12/20 at 900 am  Please call the care guide team at [phone] if you need to cancel or reschedule your appointment.   If you are experiencing a Mental Health or Behavioral Health Crisis or need someone to talk to, please call 1-800-273-TALK (toll free, 24 hour hotline)  Patient verbalizes understanding of instructions and care plan provided today and agrees to view in MyChart. Active MyChart status and patient understanding of how to access instructions and care plan via MyChart confirmed with patient.     Juanell Fairly RN, BSN, [facility] Care Coordinator [facility]   Phone: [phone]

## 2022-10-21 NOTE — Patient Outreach (Signed)
  Care Coordination   Follow Up Visit Note   10/19/2022 Name: Jose Li MRN: [id number] DOB: [birth year]  Jose Li is a 66 y.o. year old male who sees Bess Kinds, MD for primary care. I spoke with  Jose Li by phone today.  What matters to the patients health and wellness today?  Jose Li mentioned that he is feeling fair,  a little tired and weak. He has been experiencing some diarrhea but is managing it with the help of Imodium, Kaopectate, or Pepto-Bismol and is also keeping himself hydrated. He is getting proper rest and taking his medication as per the instructions. His next appointment with the cardiologist is scheduled for 10/26/22, and his girlfriend will accompany him.    Goals Addressed             This Visit's Progress    To manage andmonitor his low blood pressure and falls       Care Coordination Interventions: Active listening / Reflection utilized  Emotional Support Provided Problem Solving /Task Center strategies reviewed Reviewed medications and discussed potential side effects of medications such as dizziness and frequent urination Advised patient of importance of notifying provider of falls Assessed for falls since last encounter Assessed patients knowledge of fall risk prevention secondary to previously provided education Evaluation of current treatment plan related to hypotension self management and patient's adherence to plan as established by provider Reviewed medications with patient and discussed importance of compliance Discussed plans with patient for ongoing care management follow up and provided patient with direct contact information for care management team Reviewed scheduled/upcoming provider appointments          SDOH assessments and interventions completed:  No     Care Coordination Interventions:  Yes, provided   Follow up plan: Follow up call scheduled for 12/20 900 am    Encounter Outcome:  Pt. Visit Completed    Juanell Fairly RN, BSN, [facility] Care Coordinator [facility]   Phone: [phone]

## 2022-10-24 NOTE — Progress Notes (Deleted)
Cardiology Office Note   Date:  10/24/2022   ID:  Jose Li, DOB 1956-03-01, MRN [id number]  PCP:  Bess Kinds, MD  Cardiologist:   Dietrich Pates, MD   F/U of dizziness, orthostatic intolerance    History of Present Illness: Jose Li is a 66 y.o. male with a history of dizziness, syncope, autonomic dysfuncton   I saw the pt in Sr[place] 2022    SInce seen he says he has had a few episodes of dizziness  One was pretty bad   But no syncope   Others were minor  The patient says he is drinking plenty of fluids   NO changes in meds    I saw the pt in 2022  Allergies:   Cymbalta [duloxetine hcl], Erythromycin, Hctz [hydrochlorothiazide], and Inderal [propranolol]   Past Medical History:  Diagnosis Date   Acute kidney injury (HCC) 05/26/2018   Allergy    Anemia    Anxiety    Depression    Hyperlipidemia    Hypertension, essential, benign 08/02/2012   Orthostatic hypotension 10/09/2013   OSA (obstructive sleep apnea) 10/01/2015   Severe OSA with an AHI of 86/hr now on BiPAP at 23/19cm H2O   Rhabdomyolysis 08/24/2022   Seizures (HCC)    none for last 6-37months   Substance abuse [facility])     Past Surgical History:  Procedure Laterality Date   CRANIOTOMY Left 06/08/2022   Procedure: CRANIOTOMY HEMATOMA EVACUATION SUBDURAL;  Surgeon: Julio Sicks, MD;  Location: [facility] OR;  Service: Neurosurgery;  Laterality: Left;   FRACTURE SURGERY  1990   right hip and femur   HERNIA REPAIR  07/2009   umbilical hernia/ got infected   JOINT REPLACEMENT Right 1990   right hip   TRACHEOSTOMY  08/1999   Had pneumoniaand was in a coma 10 days     Social History:  The patient  reports that he has been smoking cigarettes. He has a 30.00 pack-year smoking history. He has been exposed to tobacco smoke. He has never used smokeless tobacco. He reports that he does not currently use drugs. He reports that he does not drink alcohol.   Family History:  The patient's family history includes Diabetes in  his father; Heart disease in his brother and mother.    ROS:  Please see the history of present illness. All other systems are reviewed and  Negative to the above problem except as noted.    PHYSICAL EXAM: VS:  There were no vitals taken for this visit.  GEN: Pt is a 66 yo in NAD  HEENT: normal  Neck:   JVP is d    No  carotid bruits,  Cardiac: RRR; no murmurs.  No LE  edema    Respiratory:  clear to auscultation bilaterally, GI: soft, No masses , nondistended, + BS  No hepatomegaly  MS: no deformity Moving all extremities   Skin: warm and dry, no rash    EKG:  EKG is not ordered today.    Lipid Panel    Component Value Date/Time   CHOL 114 09/14/2021 1102   TRIG 147 09/14/2021 1102   HDL 36 (L) 09/14/2021 1102   CHOLHDL 3.2 09/14/2021 1102   CHOLHDL 5.3 (H) 12/29/2015 1146   VLDL 79 (H) 12/29/2015 1146   LDLCALC 53 09/14/2021 1102      Wt Readings from Last 3 Encounters:  09/20/22 163 lb 12.8 oz (74.3 kg)  08/27/22 166 lb 7.2 oz (75.5 kg)  08/10/22 160  lb (72.6 kg)      ASSESSMENT AND PLAN: 1  Autonomic dysfunction Pt doing well on current regimen Occasional spells   Stays hydartd      2 CAD.  Mild plaqing on CT   Follow  No symptoms   Control risks  Lipdis good in December    3 dyslipidemia ON statin Follow in spring     4  Cr Labile   WIll check BMET today  Will check CBC as well    5  Hypokalemia  Check     F/U next summer    Current medicines are reviewed at length with the patient today.  The patient does not have concerns regarding medicines.  Signed, Dietrich Pates, MD  10/24/2022 10:16 PM    [facility] [address] [place], [place], [place]  [address] Phone: [phone]; Fax: [phone]

## 2022-10-25 DIAGNOSIS — Z7982 Long term (current) use of aspirin: Secondary | ICD-10-CM | POA: Diagnosis not present

## 2022-10-25 DIAGNOSIS — Z8669 Personal history of other diseases of the nervous system and sense organs: Secondary | ICD-10-CM | POA: Diagnosis not present

## 2022-10-25 DIAGNOSIS — G4733 Obstructive sleep apnea (adult) (pediatric): Secondary | ICD-10-CM | POA: Diagnosis not present

## 2022-10-25 DIAGNOSIS — Z9181 History of falling: Secondary | ICD-10-CM | POA: Diagnosis not present

## 2022-10-25 DIAGNOSIS — Z9981 Dependence on supplemental oxygen: Secondary | ICD-10-CM | POA: Diagnosis not present

## 2022-10-25 DIAGNOSIS — D649 Anemia, unspecified: Secondary | ICD-10-CM | POA: Diagnosis not present

## 2022-10-25 DIAGNOSIS — E785 Hyperlipidemia, unspecified: Secondary | ICD-10-CM | POA: Diagnosis not present

## 2022-10-25 DIAGNOSIS — J302 Other seasonal allergic rhinitis: Secondary | ICD-10-CM | POA: Diagnosis not present

## 2022-10-25 DIAGNOSIS — E876 Hypokalemia: Secondary | ICD-10-CM | POA: Diagnosis not present

## 2022-10-25 DIAGNOSIS — I10 Essential (primary) hypertension: Secondary | ICD-10-CM | POA: Diagnosis not present

## 2022-10-25 DIAGNOSIS — I951 Orthostatic hypotension: Secondary | ICD-10-CM | POA: Diagnosis not present

## 2022-10-25 DIAGNOSIS — Z87891 Personal history of nicotine dependence: Secondary | ICD-10-CM | POA: Diagnosis not present

## 2022-10-25 DIAGNOSIS — M6282 Rhabdomyolysis: Secondary | ICD-10-CM | POA: Diagnosis not present

## 2022-10-25 DIAGNOSIS — R1310 Dysphagia, unspecified: Secondary | ICD-10-CM | POA: Diagnosis not present

## 2022-10-26 ENCOUNTER — Ambulatory Visit: Payer: Medicare Other | Admitting: Internal Medicine

## 2022-10-27 DIAGNOSIS — I10 Essential (primary) hypertension: Secondary | ICD-10-CM | POA: Diagnosis not present

## 2022-10-27 DIAGNOSIS — M6282 Rhabdomyolysis: Secondary | ICD-10-CM | POA: Diagnosis not present

## 2022-10-27 DIAGNOSIS — Z8669 Personal history of other diseases of the nervous system and sense organs: Secondary | ICD-10-CM | POA: Diagnosis not present

## 2022-10-27 DIAGNOSIS — R1310 Dysphagia, unspecified: Secondary | ICD-10-CM | POA: Diagnosis not present

## 2022-10-27 DIAGNOSIS — Z7982 Long term (current) use of aspirin: Secondary | ICD-10-CM | POA: Diagnosis not present

## 2022-10-27 DIAGNOSIS — Z87891 Personal history of nicotine dependence: Secondary | ICD-10-CM | POA: Diagnosis not present

## 2022-10-27 DIAGNOSIS — Z9181 History of falling: Secondary | ICD-10-CM | POA: Diagnosis not present

## 2022-10-27 DIAGNOSIS — Z9981 Dependence on supplemental oxygen: Secondary | ICD-10-CM | POA: Diagnosis not present

## 2022-10-27 DIAGNOSIS — D649 Anemia, unspecified: Secondary | ICD-10-CM | POA: Diagnosis not present

## 2022-10-27 DIAGNOSIS — J302 Other seasonal allergic rhinitis: Secondary | ICD-10-CM | POA: Diagnosis not present

## 2022-10-27 DIAGNOSIS — I951 Orthostatic hypotension: Secondary | ICD-10-CM | POA: Diagnosis not present

## 2022-10-27 DIAGNOSIS — E785 Hyperlipidemia, unspecified: Secondary | ICD-10-CM | POA: Diagnosis not present

## 2022-10-27 DIAGNOSIS — G4733 Obstructive sleep apnea (adult) (pediatric): Secondary | ICD-10-CM | POA: Diagnosis not present

## 2022-10-27 DIAGNOSIS — E876 Hypokalemia: Secondary | ICD-10-CM | POA: Diagnosis not present

## 2022-10-28 DIAGNOSIS — M6282 Rhabdomyolysis: Secondary | ICD-10-CM | POA: Diagnosis not present

## 2022-10-28 DIAGNOSIS — R1310 Dysphagia, unspecified: Secondary | ICD-10-CM | POA: Diagnosis not present

## 2022-10-28 DIAGNOSIS — Z8669 Personal history of other diseases of the nervous system and sense organs: Secondary | ICD-10-CM | POA: Diagnosis not present

## 2022-10-28 DIAGNOSIS — Z87891 Personal history of nicotine dependence: Secondary | ICD-10-CM | POA: Diagnosis not present

## 2022-10-28 DIAGNOSIS — J302 Other seasonal allergic rhinitis: Secondary | ICD-10-CM | POA: Diagnosis not present

## 2022-10-28 DIAGNOSIS — Z9181 History of falling: Secondary | ICD-10-CM | POA: Diagnosis not present

## 2022-10-28 DIAGNOSIS — Z7982 Long term (current) use of aspirin: Secondary | ICD-10-CM | POA: Diagnosis not present

## 2022-10-28 DIAGNOSIS — D649 Anemia, unspecified: Secondary | ICD-10-CM | POA: Diagnosis not present

## 2022-10-28 DIAGNOSIS — G4733 Obstructive sleep apnea (adult) (pediatric): Secondary | ICD-10-CM | POA: Diagnosis not present

## 2022-10-28 DIAGNOSIS — E785 Hyperlipidemia, unspecified: Secondary | ICD-10-CM | POA: Diagnosis not present

## 2022-10-28 DIAGNOSIS — E876 Hypokalemia: Secondary | ICD-10-CM | POA: Diagnosis not present

## 2022-10-28 DIAGNOSIS — Z9981 Dependence on supplemental oxygen: Secondary | ICD-10-CM | POA: Diagnosis not present

## 2022-10-28 DIAGNOSIS — I10 Essential (primary) hypertension: Secondary | ICD-10-CM | POA: Diagnosis not present

## 2022-10-28 DIAGNOSIS — I951 Orthostatic hypotension: Secondary | ICD-10-CM | POA: Diagnosis not present

## 2022-11-01 DIAGNOSIS — Z9181 History of falling: Secondary | ICD-10-CM | POA: Diagnosis not present

## 2022-11-01 DIAGNOSIS — Z9981 Dependence on supplemental oxygen: Secondary | ICD-10-CM | POA: Diagnosis not present

## 2022-11-01 DIAGNOSIS — G4733 Obstructive sleep apnea (adult) (pediatric): Secondary | ICD-10-CM | POA: Diagnosis not present

## 2022-11-01 DIAGNOSIS — Z8669 Personal history of other diseases of the nervous system and sense organs: Secondary | ICD-10-CM | POA: Diagnosis not present

## 2022-11-01 DIAGNOSIS — D649 Anemia, unspecified: Secondary | ICD-10-CM | POA: Diagnosis not present

## 2022-11-01 DIAGNOSIS — M6282 Rhabdomyolysis: Secondary | ICD-10-CM | POA: Diagnosis not present

## 2022-11-01 DIAGNOSIS — J302 Other seasonal allergic rhinitis: Secondary | ICD-10-CM | POA: Diagnosis not present

## 2022-11-01 DIAGNOSIS — E785 Hyperlipidemia, unspecified: Secondary | ICD-10-CM | POA: Diagnosis not present

## 2022-11-01 DIAGNOSIS — Z7982 Long term (current) use of aspirin: Secondary | ICD-10-CM | POA: Diagnosis not present

## 2022-11-01 DIAGNOSIS — Z87891 Personal history of nicotine dependence: Secondary | ICD-10-CM | POA: Diagnosis not present

## 2022-11-01 DIAGNOSIS — E876 Hypokalemia: Secondary | ICD-10-CM | POA: Diagnosis not present

## 2022-11-01 DIAGNOSIS — R1310 Dysphagia, unspecified: Secondary | ICD-10-CM | POA: Diagnosis not present

## 2022-11-01 DIAGNOSIS — I10 Essential (primary) hypertension: Secondary | ICD-10-CM | POA: Diagnosis not present

## 2022-11-01 DIAGNOSIS — I951 Orthostatic hypotension: Secondary | ICD-10-CM | POA: Diagnosis not present

## 2022-11-03 DIAGNOSIS — I10 Essential (primary) hypertension: Secondary | ICD-10-CM | POA: Diagnosis not present

## 2022-11-03 DIAGNOSIS — E785 Hyperlipidemia, unspecified: Secondary | ICD-10-CM | POA: Diagnosis not present

## 2022-11-03 DIAGNOSIS — Z9981 Dependence on supplemental oxygen: Secondary | ICD-10-CM | POA: Diagnosis not present

## 2022-11-03 DIAGNOSIS — I951 Orthostatic hypotension: Secondary | ICD-10-CM | POA: Diagnosis not present

## 2022-11-03 DIAGNOSIS — Z8669 Personal history of other diseases of the nervous system and sense organs: Secondary | ICD-10-CM | POA: Diagnosis not present

## 2022-11-03 DIAGNOSIS — Z87891 Personal history of nicotine dependence: Secondary | ICD-10-CM | POA: Diagnosis not present

## 2022-11-03 DIAGNOSIS — Z9181 History of falling: Secondary | ICD-10-CM | POA: Diagnosis not present

## 2022-11-03 DIAGNOSIS — M6282 Rhabdomyolysis: Secondary | ICD-10-CM | POA: Diagnosis not present

## 2022-11-03 DIAGNOSIS — E876 Hypokalemia: Secondary | ICD-10-CM | POA: Diagnosis not present

## 2022-11-03 DIAGNOSIS — D649 Anemia, unspecified: Secondary | ICD-10-CM | POA: Diagnosis not present

## 2022-11-03 DIAGNOSIS — G4733 Obstructive sleep apnea (adult) (pediatric): Secondary | ICD-10-CM | POA: Diagnosis not present

## 2022-11-03 DIAGNOSIS — Z7982 Long term (current) use of aspirin: Secondary | ICD-10-CM | POA: Diagnosis not present

## 2022-11-03 DIAGNOSIS — J302 Other seasonal allergic rhinitis: Secondary | ICD-10-CM | POA: Diagnosis not present

## 2022-11-03 DIAGNOSIS — R1310 Dysphagia, unspecified: Secondary | ICD-10-CM | POA: Diagnosis not present

## 2022-11-03 NOTE — Patient Outreach (Signed)
  Care Coordination   Follow Up Visit Note   11/03/2022 Name: Jose Li MRN: [id number] DOB: Jun 27, 1956  Jose Li is a 66 y.o. year old male who sees Jose Kinds, MD for primary care. I spoke with  Jose Li by phone today.  What matters to the patients health and wellness today?  I am feeling fair.    Goals Addressed             This Visit's Progress    To manage and monitor his low blood pressure and falls       Care Coordination Interventions: Active listening / Reflection utilized  Emotional Support Provided Problem Solving /Task Center strategies reviewed Reviewed medications and discussed potential side effects of medications such as dizziness and frequent urination Advised patient of importance of notifying provider of falls Assessed for falls since last encounter Assessed patients knowledge of fall risk prevention secondary to previously provided education Evaluation of current treatment plan related to hypotension self management and patient's adherence to plan as established by provider Reviewed medications with patient and discussed importance of compliance Discussed plans with patient for ongoing care management follow up and provided patient with direct contact information for care management team I had a conversation with Jose Li this morning, and he informed me that he is doing well. He has begun physical therapy and speech therapy. During his therapy session, his blood pressure was checked, and it was within normal range, but he could not recall the exact value. I suggested he ask the medical staff to record it for him. He confirmed that he understood my suggestion. He has not experienced any headaches, chest pains, or flushing, and he is adhering to his medication regimen.        SDOH assessments and interventions completed:  No     Care Coordination Interventions:  Yes, provided   Follow up plan: Follow up call scheduled for 12/04/22  10 am     Encounter Outcome:  Pt. Visit Completed   Jose Fairly RN, BSN, [facility] Care Coordinator [facility]   Phone: [phone]

## 2022-11-03 NOTE — Patient Instructions (Signed)
Visit Information  Thank you for taking time to visit with me today. Please don't hesitate to contact me if I can be of assistance to you.   Following are the goals we discussed today:   Goals Addressed             This Visit's Progress    To manage and monitor his low blood pressure and falls       Care Coordination Interventions: Active listening / Reflection utilized  Emotional Support Provided Problem Solving /Task Center strategies reviewed Reviewed medications and discussed potential side effects of medications such as dizziness and frequent urination Advised patient of importance of notifying provider of falls Assessed for falls since last encounter Assessed patients knowledge of fall risk prevention secondary to previously provided education Evaluation of current treatment plan related to hypotension self management and patient's adherence to plan as established by provider Reviewed medications with patient and discussed importance of compliance Discussed plans with patient for ongoing care management follow up and provided patient with direct contact information for care management team I had a conversation with Jose Li this morning, and he informed me that he is doing well. He has begun physical therapy and speech therapy. During his therapy session, his blood pressure was checked, and it was within normal range, but he could not recall the exact value. I suggested he ask the medical staff to record it for him. He confirmed that he understood my suggestion. He has not experienced any headaches, chest pains, or flushing, and he is adhering to his medication regimen.        Our next appointment is by telephone on 12/07/22 at 10 am  Please call the care guide team at [phone] if you need to cancel or reschedule your appointment.   If you are experiencing a Mental Health or Behavioral Health Crisis or need someone to talk to, please call 1-800-273-TALK (toll free, 24 hour  hotline)  Patient verbalizes understanding of instructions and care plan provided today and agrees to view in MyChart. Active MyChart status and patient understanding of how to access instructions and care plan via MyChart confirmed with patient.     Jose Fairly RN, BSN, [facility] Care Coordinator [facility]   Phone: [phone]

## 2022-11-04 ENCOUNTER — Telehealth: Payer: Self-pay

## 2022-11-04 ENCOUNTER — Encounter: Payer: Self-pay | Admitting: Internal Medicine

## 2022-11-04 NOTE — Telephone Encounter (Signed)
Christy SLP calls nurse line to request verbal orders for speech as follows.   1x a week for 8 weeks.   Verbal order given.

## 2022-11-09 DIAGNOSIS — E785 Hyperlipidemia, unspecified: Secondary | ICD-10-CM | POA: Diagnosis not present

## 2022-11-09 DIAGNOSIS — M6282 Rhabdomyolysis: Secondary | ICD-10-CM | POA: Diagnosis not present

## 2022-11-09 DIAGNOSIS — R1310 Dysphagia, unspecified: Secondary | ICD-10-CM | POA: Diagnosis not present

## 2022-11-09 DIAGNOSIS — Z7982 Long term (current) use of aspirin: Secondary | ICD-10-CM | POA: Diagnosis not present

## 2022-11-09 DIAGNOSIS — J302 Other seasonal allergic rhinitis: Secondary | ICD-10-CM | POA: Diagnosis not present

## 2022-11-09 DIAGNOSIS — I951 Orthostatic hypotension: Secondary | ICD-10-CM | POA: Diagnosis not present

## 2022-11-09 DIAGNOSIS — D649 Anemia, unspecified: Secondary | ICD-10-CM | POA: Diagnosis not present

## 2022-11-09 DIAGNOSIS — Z87891 Personal history of nicotine dependence: Secondary | ICD-10-CM | POA: Diagnosis not present

## 2022-11-09 DIAGNOSIS — I10 Essential (primary) hypertension: Secondary | ICD-10-CM | POA: Diagnosis not present

## 2022-11-09 DIAGNOSIS — Z9181 History of falling: Secondary | ICD-10-CM | POA: Diagnosis not present

## 2022-11-09 DIAGNOSIS — E876 Hypokalemia: Secondary | ICD-10-CM | POA: Diagnosis not present

## 2022-11-09 DIAGNOSIS — Z9981 Dependence on supplemental oxygen: Secondary | ICD-10-CM | POA: Diagnosis not present

## 2022-11-09 DIAGNOSIS — Z8669 Personal history of other diseases of the nervous system and sense organs: Secondary | ICD-10-CM | POA: Diagnosis not present

## 2022-11-09 DIAGNOSIS — G4733 Obstructive sleep apnea (adult) (pediatric): Secondary | ICD-10-CM | POA: Diagnosis not present

## 2022-11-10 ENCOUNTER — Telehealth: Payer: Self-pay

## 2022-11-10 DIAGNOSIS — Z9181 History of falling: Secondary | ICD-10-CM | POA: Diagnosis not present

## 2022-11-10 DIAGNOSIS — Z7982 Long term (current) use of aspirin: Secondary | ICD-10-CM | POA: Diagnosis not present

## 2022-11-10 DIAGNOSIS — Z8669 Personal history of other diseases of the nervous system and sense organs: Secondary | ICD-10-CM | POA: Diagnosis not present

## 2022-11-10 DIAGNOSIS — I951 Orthostatic hypotension: Secondary | ICD-10-CM | POA: Diagnosis not present

## 2022-11-10 DIAGNOSIS — J302 Other seasonal allergic rhinitis: Secondary | ICD-10-CM | POA: Diagnosis not present

## 2022-11-10 DIAGNOSIS — Z9981 Dependence on supplemental oxygen: Secondary | ICD-10-CM | POA: Diagnosis not present

## 2022-11-10 DIAGNOSIS — E785 Hyperlipidemia, unspecified: Secondary | ICD-10-CM | POA: Diagnosis not present

## 2022-11-10 DIAGNOSIS — R1310 Dysphagia, unspecified: Secondary | ICD-10-CM | POA: Diagnosis not present

## 2022-11-10 DIAGNOSIS — M6282 Rhabdomyolysis: Secondary | ICD-10-CM | POA: Diagnosis not present

## 2022-11-10 DIAGNOSIS — E876 Hypokalemia: Secondary | ICD-10-CM | POA: Diagnosis not present

## 2022-11-10 DIAGNOSIS — G4733 Obstructive sleep apnea (adult) (pediatric): Secondary | ICD-10-CM | POA: Diagnosis not present

## 2022-11-10 DIAGNOSIS — D649 Anemia, unspecified: Secondary | ICD-10-CM | POA: Diagnosis not present

## 2022-11-10 DIAGNOSIS — Z87891 Personal history of nicotine dependence: Secondary | ICD-10-CM | POA: Diagnosis not present

## 2022-11-10 DIAGNOSIS — I10 Essential (primary) hypertension: Secondary | ICD-10-CM | POA: Diagnosis not present

## 2022-11-10 NOTE — Telephone Encounter (Signed)
Jose Li calls nurse line requesting verbal orders for continued [facility] PT.   2x a week for 3 weeks  1x a week for 2 weeks  Verbal order given to [name] to continue [facility] PT.   [name] is also requesting an orthopedic referral. She reports he has been having continued left knee pain. She reports she has exhausted her efforts as PT to help with this.   Will forward to PCP to place Ortho referral or if a visit is necessary beforehand.

## 2022-11-11 DIAGNOSIS — Z9181 History of falling: Secondary | ICD-10-CM | POA: Diagnosis not present

## 2022-11-11 DIAGNOSIS — G4733 Obstructive sleep apnea (adult) (pediatric): Secondary | ICD-10-CM | POA: Diagnosis not present

## 2022-11-11 DIAGNOSIS — J302 Other seasonal allergic rhinitis: Secondary | ICD-10-CM | POA: Diagnosis not present

## 2022-11-11 DIAGNOSIS — R1310 Dysphagia, unspecified: Secondary | ICD-10-CM | POA: Diagnosis not present

## 2022-11-11 DIAGNOSIS — I10 Essential (primary) hypertension: Secondary | ICD-10-CM | POA: Diagnosis not present

## 2022-11-11 DIAGNOSIS — I951 Orthostatic hypotension: Secondary | ICD-10-CM | POA: Diagnosis not present

## 2022-11-11 DIAGNOSIS — E876 Hypokalemia: Secondary | ICD-10-CM | POA: Diagnosis not present

## 2022-11-11 DIAGNOSIS — M6282 Rhabdomyolysis: Secondary | ICD-10-CM | POA: Diagnosis not present

## 2022-11-11 DIAGNOSIS — Z8669 Personal history of other diseases of the nervous system and sense organs: Secondary | ICD-10-CM | POA: Diagnosis not present

## 2022-11-11 DIAGNOSIS — D649 Anemia, unspecified: Secondary | ICD-10-CM | POA: Diagnosis not present

## 2022-11-11 DIAGNOSIS — Z7982 Long term (current) use of aspirin: Secondary | ICD-10-CM | POA: Diagnosis not present

## 2022-11-11 DIAGNOSIS — Z87891 Personal history of nicotine dependence: Secondary | ICD-10-CM | POA: Diagnosis not present

## 2022-11-11 DIAGNOSIS — Z9981 Dependence on supplemental oxygen: Secondary | ICD-10-CM | POA: Diagnosis not present

## 2022-11-11 DIAGNOSIS — E785 Hyperlipidemia, unspecified: Secondary | ICD-10-CM | POA: Diagnosis not present

## 2022-11-11 NOTE — Telephone Encounter (Signed)
Hey!  Patient will need to make an appointment to be seen for his left knee pain. I'm not familiar with this, and don't see it noted in his chart.   Thank you!  -BS

## 2022-11-12 DIAGNOSIS — I10 Essential (primary) hypertension: Secondary | ICD-10-CM | POA: Diagnosis not present

## 2022-11-12 DIAGNOSIS — Z7982 Long term (current) use of aspirin: Secondary | ICD-10-CM | POA: Diagnosis not present

## 2022-11-12 DIAGNOSIS — G4733 Obstructive sleep apnea (adult) (pediatric): Secondary | ICD-10-CM | POA: Diagnosis not present

## 2022-11-12 DIAGNOSIS — E876 Hypokalemia: Secondary | ICD-10-CM | POA: Diagnosis not present

## 2022-11-12 DIAGNOSIS — I951 Orthostatic hypotension: Secondary | ICD-10-CM | POA: Diagnosis not present

## 2022-11-12 DIAGNOSIS — M6282 Rhabdomyolysis: Secondary | ICD-10-CM | POA: Diagnosis not present

## 2022-11-12 DIAGNOSIS — D649 Anemia, unspecified: Secondary | ICD-10-CM | POA: Diagnosis not present

## 2022-11-12 DIAGNOSIS — Z8669 Personal history of other diseases of the nervous system and sense organs: Secondary | ICD-10-CM | POA: Diagnosis not present

## 2022-11-12 DIAGNOSIS — Z87891 Personal history of nicotine dependence: Secondary | ICD-10-CM | POA: Diagnosis not present

## 2022-11-12 DIAGNOSIS — Z9181 History of falling: Secondary | ICD-10-CM | POA: Diagnosis not present

## 2022-11-12 DIAGNOSIS — J302 Other seasonal allergic rhinitis: Secondary | ICD-10-CM | POA: Diagnosis not present

## 2022-11-12 DIAGNOSIS — E785 Hyperlipidemia, unspecified: Secondary | ICD-10-CM | POA: Diagnosis not present

## 2022-11-12 DIAGNOSIS — R1310 Dysphagia, unspecified: Secondary | ICD-10-CM | POA: Diagnosis not present

## 2022-11-12 DIAGNOSIS — Z9981 Dependence on supplemental oxygen: Secondary | ICD-10-CM | POA: Diagnosis not present

## 2022-11-13 ENCOUNTER — Other Ambulatory Visit: Payer: Self-pay | Admitting: Internal Medicine

## 2022-11-16 DIAGNOSIS — E876 Hypokalemia: Secondary | ICD-10-CM | POA: Diagnosis not present

## 2022-11-16 DIAGNOSIS — Z7982 Long term (current) use of aspirin: Secondary | ICD-10-CM | POA: Diagnosis not present

## 2022-11-16 DIAGNOSIS — E785 Hyperlipidemia, unspecified: Secondary | ICD-10-CM | POA: Diagnosis not present

## 2022-11-16 DIAGNOSIS — Z9181 History of falling: Secondary | ICD-10-CM | POA: Diagnosis not present

## 2022-11-16 DIAGNOSIS — G4733 Obstructive sleep apnea (adult) (pediatric): Secondary | ICD-10-CM | POA: Diagnosis not present

## 2022-11-16 DIAGNOSIS — R1310 Dysphagia, unspecified: Secondary | ICD-10-CM | POA: Diagnosis not present

## 2022-11-16 DIAGNOSIS — J302 Other seasonal allergic rhinitis: Secondary | ICD-10-CM | POA: Diagnosis not present

## 2022-11-16 DIAGNOSIS — Z87891 Personal history of nicotine dependence: Secondary | ICD-10-CM | POA: Diagnosis not present

## 2022-11-16 DIAGNOSIS — I10 Essential (primary) hypertension: Secondary | ICD-10-CM | POA: Diagnosis not present

## 2022-11-16 DIAGNOSIS — Z8669 Personal history of other diseases of the nervous system and sense organs: Secondary | ICD-10-CM | POA: Diagnosis not present

## 2022-11-16 DIAGNOSIS — M6282 Rhabdomyolysis: Secondary | ICD-10-CM | POA: Diagnosis not present

## 2022-11-16 DIAGNOSIS — D649 Anemia, unspecified: Secondary | ICD-10-CM | POA: Diagnosis not present

## 2022-11-16 DIAGNOSIS — I951 Orthostatic hypotension: Secondary | ICD-10-CM | POA: Diagnosis not present

## 2022-11-16 DIAGNOSIS — Z9981 Dependence on supplemental oxygen: Secondary | ICD-10-CM | POA: Diagnosis not present

## 2022-11-17 DIAGNOSIS — E785 Hyperlipidemia, unspecified: Secondary | ICD-10-CM | POA: Diagnosis not present

## 2022-11-17 DIAGNOSIS — E876 Hypokalemia: Secondary | ICD-10-CM | POA: Diagnosis not present

## 2022-11-17 DIAGNOSIS — R1310 Dysphagia, unspecified: Secondary | ICD-10-CM | POA: Diagnosis not present

## 2022-11-17 DIAGNOSIS — M6282 Rhabdomyolysis: Secondary | ICD-10-CM | POA: Diagnosis not present

## 2022-11-17 DIAGNOSIS — D649 Anemia, unspecified: Secondary | ICD-10-CM | POA: Diagnosis not present

## 2022-11-17 DIAGNOSIS — Z9981 Dependence on supplemental oxygen: Secondary | ICD-10-CM | POA: Diagnosis not present

## 2022-11-17 DIAGNOSIS — Z8669 Personal history of other diseases of the nervous system and sense organs: Secondary | ICD-10-CM | POA: Diagnosis not present

## 2022-11-17 DIAGNOSIS — I951 Orthostatic hypotension: Secondary | ICD-10-CM | POA: Diagnosis not present

## 2022-11-17 DIAGNOSIS — Z87891 Personal history of nicotine dependence: Secondary | ICD-10-CM | POA: Diagnosis not present

## 2022-11-17 DIAGNOSIS — Z9181 History of falling: Secondary | ICD-10-CM | POA: Diagnosis not present

## 2022-11-17 DIAGNOSIS — I10 Essential (primary) hypertension: Secondary | ICD-10-CM | POA: Diagnosis not present

## 2022-11-17 DIAGNOSIS — J302 Other seasonal allergic rhinitis: Secondary | ICD-10-CM | POA: Diagnosis not present

## 2022-11-17 DIAGNOSIS — Z7982 Long term (current) use of aspirin: Secondary | ICD-10-CM | POA: Diagnosis not present

## 2022-11-17 DIAGNOSIS — G4733 Obstructive sleep apnea (adult) (pediatric): Secondary | ICD-10-CM | POA: Diagnosis not present

## 2022-11-18 DIAGNOSIS — Z9181 History of falling: Secondary | ICD-10-CM | POA: Diagnosis not present

## 2022-11-18 DIAGNOSIS — Z7982 Long term (current) use of aspirin: Secondary | ICD-10-CM | POA: Diagnosis not present

## 2022-11-18 DIAGNOSIS — M6282 Rhabdomyolysis: Secondary | ICD-10-CM | POA: Diagnosis not present

## 2022-11-18 DIAGNOSIS — J302 Other seasonal allergic rhinitis: Secondary | ICD-10-CM | POA: Diagnosis not present

## 2022-11-18 DIAGNOSIS — G4733 Obstructive sleep apnea (adult) (pediatric): Secondary | ICD-10-CM | POA: Diagnosis not present

## 2022-11-18 DIAGNOSIS — I10 Essential (primary) hypertension: Secondary | ICD-10-CM | POA: Diagnosis not present

## 2022-11-18 DIAGNOSIS — R1310 Dysphagia, unspecified: Secondary | ICD-10-CM | POA: Diagnosis not present

## 2022-11-18 DIAGNOSIS — Z8669 Personal history of other diseases of the nervous system and sense organs: Secondary | ICD-10-CM | POA: Diagnosis not present

## 2022-11-18 DIAGNOSIS — E876 Hypokalemia: Secondary | ICD-10-CM | POA: Diagnosis not present

## 2022-11-18 DIAGNOSIS — E785 Hyperlipidemia, unspecified: Secondary | ICD-10-CM | POA: Diagnosis not present

## 2022-11-18 DIAGNOSIS — D649 Anemia, unspecified: Secondary | ICD-10-CM | POA: Diagnosis not present

## 2022-11-18 DIAGNOSIS — Z9981 Dependence on supplemental oxygen: Secondary | ICD-10-CM | POA: Diagnosis not present

## 2022-11-18 DIAGNOSIS — I951 Orthostatic hypotension: Secondary | ICD-10-CM | POA: Diagnosis not present

## 2022-11-18 DIAGNOSIS — Z87891 Personal history of nicotine dependence: Secondary | ICD-10-CM | POA: Diagnosis not present

## 2022-11-19 DIAGNOSIS — I951 Orthostatic hypotension: Secondary | ICD-10-CM | POA: Diagnosis not present

## 2022-11-19 DIAGNOSIS — E876 Hypokalemia: Secondary | ICD-10-CM | POA: Diagnosis not present

## 2022-11-19 DIAGNOSIS — Z7982 Long term (current) use of aspirin: Secondary | ICD-10-CM | POA: Diagnosis not present

## 2022-11-19 DIAGNOSIS — D649 Anemia, unspecified: Secondary | ICD-10-CM | POA: Diagnosis not present

## 2022-11-19 DIAGNOSIS — G4733 Obstructive sleep apnea (adult) (pediatric): Secondary | ICD-10-CM | POA: Diagnosis not present

## 2022-11-19 DIAGNOSIS — E785 Hyperlipidemia, unspecified: Secondary | ICD-10-CM | POA: Diagnosis not present

## 2022-11-19 DIAGNOSIS — Z9981 Dependence on supplemental oxygen: Secondary | ICD-10-CM | POA: Diagnosis not present

## 2022-11-19 DIAGNOSIS — Z9181 History of falling: Secondary | ICD-10-CM | POA: Diagnosis not present

## 2022-11-19 DIAGNOSIS — I10 Essential (primary) hypertension: Secondary | ICD-10-CM | POA: Diagnosis not present

## 2022-11-19 DIAGNOSIS — J302 Other seasonal allergic rhinitis: Secondary | ICD-10-CM | POA: Diagnosis not present

## 2022-11-19 DIAGNOSIS — R1310 Dysphagia, unspecified: Secondary | ICD-10-CM | POA: Diagnosis not present

## 2022-11-19 DIAGNOSIS — Z87891 Personal history of nicotine dependence: Secondary | ICD-10-CM | POA: Diagnosis not present

## 2022-11-19 DIAGNOSIS — M6282 Rhabdomyolysis: Secondary | ICD-10-CM | POA: Diagnosis not present

## 2022-11-19 DIAGNOSIS — Z8669 Personal history of other diseases of the nervous system and sense organs: Secondary | ICD-10-CM | POA: Diagnosis not present

## 2022-11-22 DIAGNOSIS — E876 Hypokalemia: Secondary | ICD-10-CM | POA: Diagnosis not present

## 2022-11-22 DIAGNOSIS — M6282 Rhabdomyolysis: Secondary | ICD-10-CM | POA: Diagnosis not present

## 2022-11-22 DIAGNOSIS — Z9181 History of falling: Secondary | ICD-10-CM | POA: Diagnosis not present

## 2022-11-22 DIAGNOSIS — D649 Anemia, unspecified: Secondary | ICD-10-CM | POA: Diagnosis not present

## 2022-11-22 DIAGNOSIS — Z8669 Personal history of other diseases of the nervous system and sense organs: Secondary | ICD-10-CM | POA: Diagnosis not present

## 2022-11-22 DIAGNOSIS — J302 Other seasonal allergic rhinitis: Secondary | ICD-10-CM | POA: Diagnosis not present

## 2022-11-22 DIAGNOSIS — Z87891 Personal history of nicotine dependence: Secondary | ICD-10-CM | POA: Diagnosis not present

## 2022-11-22 DIAGNOSIS — G4733 Obstructive sleep apnea (adult) (pediatric): Secondary | ICD-10-CM | POA: Diagnosis not present

## 2022-11-22 DIAGNOSIS — I951 Orthostatic hypotension: Secondary | ICD-10-CM | POA: Diagnosis not present

## 2022-11-22 DIAGNOSIS — E785 Hyperlipidemia, unspecified: Secondary | ICD-10-CM | POA: Diagnosis not present

## 2022-11-22 DIAGNOSIS — Z7982 Long term (current) use of aspirin: Secondary | ICD-10-CM | POA: Diagnosis not present

## 2022-11-22 DIAGNOSIS — I10 Essential (primary) hypertension: Secondary | ICD-10-CM | POA: Diagnosis not present

## 2022-11-22 DIAGNOSIS — Z9981 Dependence on supplemental oxygen: Secondary | ICD-10-CM | POA: Diagnosis not present

## 2022-11-22 DIAGNOSIS — R1310 Dysphagia, unspecified: Secondary | ICD-10-CM | POA: Diagnosis not present

## 2022-11-22 NOTE — Telephone Encounter (Signed)
LVM with Barnetta Chapel to advise patient to make an apt.

## 2022-11-23 DIAGNOSIS — D649 Anemia, unspecified: Secondary | ICD-10-CM | POA: Diagnosis not present

## 2022-11-23 DIAGNOSIS — M6282 Rhabdomyolysis: Secondary | ICD-10-CM | POA: Diagnosis not present

## 2022-11-23 DIAGNOSIS — E876 Hypokalemia: Secondary | ICD-10-CM | POA: Diagnosis not present

## 2022-11-23 DIAGNOSIS — Z7982 Long term (current) use of aspirin: Secondary | ICD-10-CM | POA: Diagnosis not present

## 2022-11-23 DIAGNOSIS — I10 Essential (primary) hypertension: Secondary | ICD-10-CM | POA: Diagnosis not present

## 2022-11-23 DIAGNOSIS — Z87891 Personal history of nicotine dependence: Secondary | ICD-10-CM | POA: Diagnosis not present

## 2022-11-23 DIAGNOSIS — Z9181 History of falling: Secondary | ICD-10-CM | POA: Diagnosis not present

## 2022-11-23 DIAGNOSIS — I951 Orthostatic hypotension: Secondary | ICD-10-CM | POA: Diagnosis not present

## 2022-11-23 DIAGNOSIS — R1310 Dysphagia, unspecified: Secondary | ICD-10-CM | POA: Diagnosis not present

## 2022-11-23 DIAGNOSIS — Z9981 Dependence on supplemental oxygen: Secondary | ICD-10-CM | POA: Diagnosis not present

## 2022-11-23 DIAGNOSIS — G4733 Obstructive sleep apnea (adult) (pediatric): Secondary | ICD-10-CM | POA: Diagnosis not present

## 2022-11-23 DIAGNOSIS — Z8669 Personal history of other diseases of the nervous system and sense organs: Secondary | ICD-10-CM | POA: Diagnosis not present

## 2022-11-23 DIAGNOSIS — J302 Other seasonal allergic rhinitis: Secondary | ICD-10-CM | POA: Diagnosis not present

## 2022-11-23 DIAGNOSIS — E785 Hyperlipidemia, unspecified: Secondary | ICD-10-CM | POA: Diagnosis not present

## 2022-11-24 DIAGNOSIS — I951 Orthostatic hypotension: Secondary | ICD-10-CM | POA: Diagnosis not present

## 2022-11-24 DIAGNOSIS — Z7982 Long term (current) use of aspirin: Secondary | ICD-10-CM | POA: Diagnosis not present

## 2022-11-24 DIAGNOSIS — I10 Essential (primary) hypertension: Secondary | ICD-10-CM | POA: Diagnosis not present

## 2022-11-24 DIAGNOSIS — Z9181 History of falling: Secondary | ICD-10-CM | POA: Diagnosis not present

## 2022-11-24 DIAGNOSIS — G4733 Obstructive sleep apnea (adult) (pediatric): Secondary | ICD-10-CM | POA: Diagnosis not present

## 2022-11-24 DIAGNOSIS — R1310 Dysphagia, unspecified: Secondary | ICD-10-CM | POA: Diagnosis not present

## 2022-11-24 DIAGNOSIS — Z9981 Dependence on supplemental oxygen: Secondary | ICD-10-CM | POA: Diagnosis not present

## 2022-11-24 DIAGNOSIS — M6282 Rhabdomyolysis: Secondary | ICD-10-CM | POA: Diagnosis not present

## 2022-11-24 DIAGNOSIS — Z8669 Personal history of other diseases of the nervous system and sense organs: Secondary | ICD-10-CM | POA: Diagnosis not present

## 2022-11-24 DIAGNOSIS — D649 Anemia, unspecified: Secondary | ICD-10-CM | POA: Diagnosis not present

## 2022-11-24 DIAGNOSIS — Z87891 Personal history of nicotine dependence: Secondary | ICD-10-CM | POA: Diagnosis not present

## 2022-11-24 DIAGNOSIS — J302 Other seasonal allergic rhinitis: Secondary | ICD-10-CM | POA: Diagnosis not present

## 2022-11-24 DIAGNOSIS — E876 Hypokalemia: Secondary | ICD-10-CM | POA: Diagnosis not present

## 2022-11-24 DIAGNOSIS — E785 Hyperlipidemia, unspecified: Secondary | ICD-10-CM | POA: Diagnosis not present

## 2022-11-29 DIAGNOSIS — I10 Essential (primary) hypertension: Secondary | ICD-10-CM | POA: Diagnosis not present

## 2022-11-29 DIAGNOSIS — R1310 Dysphagia, unspecified: Secondary | ICD-10-CM | POA: Diagnosis not present

## 2022-11-29 DIAGNOSIS — I951 Orthostatic hypotension: Secondary | ICD-10-CM | POA: Diagnosis not present

## 2022-11-29 DIAGNOSIS — Z7982 Long term (current) use of aspirin: Secondary | ICD-10-CM | POA: Diagnosis not present

## 2022-11-29 DIAGNOSIS — Z8669 Personal history of other diseases of the nervous system and sense organs: Secondary | ICD-10-CM | POA: Diagnosis not present

## 2022-11-29 DIAGNOSIS — J302 Other seasonal allergic rhinitis: Secondary | ICD-10-CM | POA: Diagnosis not present

## 2022-11-29 DIAGNOSIS — Z9981 Dependence on supplemental oxygen: Secondary | ICD-10-CM | POA: Diagnosis not present

## 2022-11-29 DIAGNOSIS — D649 Anemia, unspecified: Secondary | ICD-10-CM | POA: Diagnosis not present

## 2022-11-29 DIAGNOSIS — E785 Hyperlipidemia, unspecified: Secondary | ICD-10-CM | POA: Diagnosis not present

## 2022-11-29 DIAGNOSIS — M6282 Rhabdomyolysis: Secondary | ICD-10-CM | POA: Diagnosis not present

## 2022-11-29 DIAGNOSIS — G4733 Obstructive sleep apnea (adult) (pediatric): Secondary | ICD-10-CM | POA: Diagnosis not present

## 2022-11-29 DIAGNOSIS — E876 Hypokalemia: Secondary | ICD-10-CM | POA: Diagnosis not present

## 2022-11-29 DIAGNOSIS — Z87891 Personal history of nicotine dependence: Secondary | ICD-10-CM | POA: Diagnosis not present

## 2022-11-29 DIAGNOSIS — Z9181 History of falling: Secondary | ICD-10-CM | POA: Diagnosis not present

## 2022-12-01 ENCOUNTER — Ambulatory Visit (AMBULATORY_SURGERY_CENTER): Payer: 59 | Admitting: *Deleted

## 2022-12-01 ENCOUNTER — Ambulatory Visit: Payer: 59 | Admitting: Internal Medicine

## 2022-12-01 VITALS — Ht 71.0 in | Wt 160.0 lb

## 2022-12-01 DIAGNOSIS — D649 Anemia, unspecified: Secondary | ICD-10-CM | POA: Diagnosis not present

## 2022-12-01 DIAGNOSIS — I10 Essential (primary) hypertension: Secondary | ICD-10-CM | POA: Diagnosis not present

## 2022-12-01 DIAGNOSIS — R1310 Dysphagia, unspecified: Secondary | ICD-10-CM | POA: Diagnosis not present

## 2022-12-01 DIAGNOSIS — I951 Orthostatic hypotension: Secondary | ICD-10-CM | POA: Diagnosis not present

## 2022-12-01 DIAGNOSIS — E876 Hypokalemia: Secondary | ICD-10-CM | POA: Diagnosis not present

## 2022-12-01 DIAGNOSIS — Z9181 History of falling: Secondary | ICD-10-CM | POA: Diagnosis not present

## 2022-12-01 DIAGNOSIS — E785 Hyperlipidemia, unspecified: Secondary | ICD-10-CM | POA: Diagnosis not present

## 2022-12-01 DIAGNOSIS — M6282 Rhabdomyolysis: Secondary | ICD-10-CM | POA: Diagnosis not present

## 2022-12-01 DIAGNOSIS — Z9981 Dependence on supplemental oxygen: Secondary | ICD-10-CM | POA: Diagnosis not present

## 2022-12-01 DIAGNOSIS — Z1211 Encounter for screening for malignant neoplasm of colon: Secondary | ICD-10-CM

## 2022-12-01 DIAGNOSIS — Z87891 Personal history of nicotine dependence: Secondary | ICD-10-CM | POA: Diagnosis not present

## 2022-12-01 DIAGNOSIS — G4733 Obstructive sleep apnea (adult) (pediatric): Secondary | ICD-10-CM | POA: Diagnosis not present

## 2022-12-01 DIAGNOSIS — Z8669 Personal history of other diseases of the nervous system and sense organs: Secondary | ICD-10-CM | POA: Diagnosis not present

## 2022-12-01 DIAGNOSIS — J302 Other seasonal allergic rhinitis: Secondary | ICD-10-CM | POA: Diagnosis not present

## 2022-12-01 DIAGNOSIS — Z7982 Long term (current) use of aspirin: Secondary | ICD-10-CM | POA: Diagnosis not present

## 2022-12-01 MED ORDER — NA SULFATE-K SULFATE-MG SULF 17.5-3.13-1.6 GM/177ML PO SOLN: 1.0000 | Freq: Once | ORAL | 0 refills | Status: AC

## 2022-12-01 NOTE — Progress Notes (Signed)

## 2022-12-06 DIAGNOSIS — Z87891 Personal history of nicotine dependence: Secondary | ICD-10-CM | POA: Diagnosis not present

## 2022-12-06 DIAGNOSIS — I10 Essential (primary) hypertension: Secondary | ICD-10-CM | POA: Diagnosis not present

## 2022-12-06 DIAGNOSIS — Z9981 Dependence on supplemental oxygen: Secondary | ICD-10-CM | POA: Diagnosis not present

## 2022-12-06 DIAGNOSIS — R1310 Dysphagia, unspecified: Secondary | ICD-10-CM | POA: Diagnosis not present

## 2022-12-06 DIAGNOSIS — I951 Orthostatic hypotension: Secondary | ICD-10-CM | POA: Diagnosis not present

## 2022-12-06 DIAGNOSIS — Z8669 Personal history of other diseases of the nervous system and sense organs: Secondary | ICD-10-CM | POA: Diagnosis not present

## 2022-12-06 DIAGNOSIS — E785 Hyperlipidemia, unspecified: Secondary | ICD-10-CM | POA: Diagnosis not present

## 2022-12-06 DIAGNOSIS — M6282 Rhabdomyolysis: Secondary | ICD-10-CM | POA: Diagnosis not present

## 2022-12-06 DIAGNOSIS — Z7982 Long term (current) use of aspirin: Secondary | ICD-10-CM | POA: Diagnosis not present

## 2022-12-06 DIAGNOSIS — D649 Anemia, unspecified: Secondary | ICD-10-CM | POA: Diagnosis not present

## 2022-12-06 DIAGNOSIS — Z9181 History of falling: Secondary | ICD-10-CM | POA: Diagnosis not present

## 2022-12-06 DIAGNOSIS — E876 Hypokalemia: Secondary | ICD-10-CM | POA: Diagnosis not present

## 2022-12-06 DIAGNOSIS — J302 Other seasonal allergic rhinitis: Secondary | ICD-10-CM | POA: Diagnosis not present

## 2022-12-06 DIAGNOSIS — G4733 Obstructive sleep apnea (adult) (pediatric): Secondary | ICD-10-CM | POA: Diagnosis not present

## 2022-12-07 ENCOUNTER — Ambulatory Visit: Payer: Self-pay

## 2022-12-07 NOTE — Patient Outreach (Signed)
  Care Coordination   Follow Up Visit Note   12/07/2022 Name: Jose Li MRN: [id number] DOB: 1956-02-09  [name] is a 68 y.o. year old male who sees Holley Bouche, MD for primary care. I spoke with  [name] by phone today.  What matters to the patients health and wellness today?  I had a fall    Goals Addressed             This Visit's Progress    To manage and monitor his low blood pressure and falls       Care Coordination Interventions: Active listening / Reflection utilized  Emotional Support Provided Problem [place] strategies reviewed Reviewed medications and discussed potential side effects of medications such as dizziness and frequent urination Advised patient of importance of notifying provider of falls Assessed for falls since last encounter Evaluation of current treatment plan related to hypotension self management and patient's adherence to plan as established by provider Reviewed medications with patient and discussed importance of compliance Discussed plans with patient for ongoing care management follow up and provided patient with direct contact information for care management team Mr. Whack has fallen twice recently which resulted in bruising his back. He is experiencing weakness in his legs, but he has started physical therapy, which is helping to regain strength in his legs. During our conversation, we discussed fall precautions, and I also suggested that he use a walker to maintain stability while walking. Additionally, he has started speech therapy. Yesterday, he checked his blood pressure and it was 112/64. He indicated that he understands the instructions given to him.        SDOH assessments and interventions completed:  No      Care Coordination Interventions:  Yes, provided   Follow up plan: Follow up call scheduled for 01/07/23  @ 1130 am    Encounter Outcome:  Pt. Visit Completed   Lazaro Arms RN, BSN, [facility]   Phone: [phone]

## 2022-12-07 NOTE — Patient Instructions (Signed)
Visit Information  Thank you for taking time to visit with me today. Please don't hesitate to contact me if I can be of assistance to you.   Following are the goals we discussed today:   Goals Addressed             This Visit's Progress    To manage and monitor his low blood pressure and falls       Care Coordination Interventions: Active listening / Reflection utilized  Emotional Support Provided Problem [place] strategies reviewed Reviewed medications and discussed potential side effects of medications such as dizziness and frequent urination Advised patient of importance of notifying provider of falls Assessed for falls since last encounter Evaluation of current treatment plan related to hypotension self management and patient's adherence to plan as established by provider Reviewed medications with patient and discussed importance of compliance Discussed plans with patient for ongoing care management follow up and provided patient with direct contact information for care management team Jose Li has fallen twice recently which resulted in bruising his back. He is experiencing weakness in his legs, but he has started physical therapy, which is helping to regain strength in his legs. During our conversation, we discussed fall precautions, and I also suggested that he use a walker to maintain stability while walking. Additionally, he has started speech therapy. Yesterday, he checked his blood pressure and it was 112/64. He indicated that he understands the instructions given to him.        Our next appointment is by telephone on 2/233/24 at 1130 am  Please call the care guide team at [phone] if you need to cancel or reschedule your appointment.   If you are experiencing a Mental Health or [place] or need someone to talk to, please call 1-800-273-TALK (toll free, 24 hour hotline)  Patient verbalizes understanding of instructions and care plan  provided today and agrees to view in [place]. Active MyChart status and patient understanding of how to access instructions and care plan via MyChart confirmed with patient.     Lazaro Arms RN, BSN, [facility]   Phone: [phone]

## 2022-12-08 DIAGNOSIS — Z9981 Dependence on supplemental oxygen: Secondary | ICD-10-CM | POA: Diagnosis not present

## 2022-12-08 DIAGNOSIS — E876 Hypokalemia: Secondary | ICD-10-CM | POA: Diagnosis not present

## 2022-12-08 DIAGNOSIS — Z7982 Long term (current) use of aspirin: Secondary | ICD-10-CM | POA: Diagnosis not present

## 2022-12-08 DIAGNOSIS — E785 Hyperlipidemia, unspecified: Secondary | ICD-10-CM | POA: Diagnosis not present

## 2022-12-08 DIAGNOSIS — J302 Other seasonal allergic rhinitis: Secondary | ICD-10-CM | POA: Diagnosis not present

## 2022-12-08 DIAGNOSIS — D649 Anemia, unspecified: Secondary | ICD-10-CM | POA: Diagnosis not present

## 2022-12-08 DIAGNOSIS — Z8669 Personal history of other diseases of the nervous system and sense organs: Secondary | ICD-10-CM | POA: Diagnosis not present

## 2022-12-08 DIAGNOSIS — I10 Essential (primary) hypertension: Secondary | ICD-10-CM | POA: Diagnosis not present

## 2022-12-08 DIAGNOSIS — Z9181 History of falling: Secondary | ICD-10-CM | POA: Diagnosis not present

## 2022-12-08 DIAGNOSIS — G4733 Obstructive sleep apnea (adult) (pediatric): Secondary | ICD-10-CM | POA: Diagnosis not present

## 2022-12-08 DIAGNOSIS — R1310 Dysphagia, unspecified: Secondary | ICD-10-CM | POA: Diagnosis not present

## 2022-12-08 DIAGNOSIS — I951 Orthostatic hypotension: Secondary | ICD-10-CM | POA: Diagnosis not present

## 2022-12-08 DIAGNOSIS — M6282 Rhabdomyolysis: Secondary | ICD-10-CM | POA: Diagnosis not present

## 2022-12-08 DIAGNOSIS — Z87891 Personal history of nicotine dependence: Secondary | ICD-10-CM | POA: Diagnosis not present

## 2022-12-12 NOTE — Progress Notes (Unsigned)
Cardiology Office Note   Date:  12/13/2022   ID:  VICTORMANUEL Li, DOB [birth year], MRN [id number]  PCP:  Holley Bouche, MD  Cardiologist:   Dorris Carnes, MD   F/U of dizziness, orthostatic intolerance    History of Present Illness: Jose Li is a 67 y.o. male with a history of dizziness, syncope, autonomic dysfuncton   I saw the pt in Srping 2022    SInce seen he says he has had a few episodes of dizziness  One was pretty bad   But no syncope   Others were minor  The patient says he is drinking plenty of fluids   NO changes in meds    I saw the pt in Oct 2022  Allergies:   Cymbalta [duloxetine hcl], Erythromycin, Hctz [hydrochlorothiazide], and Inderal [propranolol]   Past Medical History:  Diagnosis Date   Acute kidney injury ([place]) 05/26/2018   Allergy    Anemia    Anxiety    Arthritis    LEGS,BACK   Cataract    BILATERAL   Depression    Hyperlipidemia    Hypertension, essential, benign 08/02/2012   Orthostatic hypotension 10/09/2013   OSA (obstructive sleep apnea) 10/01/2015   Severe OSA with an AHI of 86/hr now on BiPAP at 23/19cm H2O   Rhabdomyolysis 08/24/2022   Seizures ([place])    none for last 6-71months   Sleep apnea    Substance abuse ([place])     Past Surgical History:  Procedure Laterality Date   COLONOSCOPY     CRANIOTOMY Left 06/08/2022   Procedure: CRANIOTOMY HEMATOMA EVACUATION SUBDURAL;  Surgeon: Earnie Larsson, MD;  Location: [place];  Service: Neurosurgery;  Laterality: Left;   FRACTURE SURGERY  1990   right hip and femur   HERNIA REPAIR  50/9326   umbilical hernia/ got infected   JOINT REPLACEMENT Right 1990   right hip   TRACHEOSTOMY  08/1999   Had pneumoniaand was in a coma 10 days     Social History:  The patient  reports that he has been smoking cigarettes. He has a 30.00 pack-year smoking history. He has been exposed to tobacco smoke. He has never used smokeless tobacco. He reports that he does not currently use drugs. He reports that he  does not drink alcohol.   Family History:  The patient's family history includes Diabetes in his father; Heart disease in his brother and mother.    ROS:  Please see the history of present illness. All other systems are reviewed and  Negative to the above problem except as noted.    PHYSICAL EXAM: VS:  BP (!) 140/80 (BP Location: Right Arm, Cuff Size: Normal)   Pulse 86   Wt 170 lb (77.1 kg)   SpO2 95%   BMI 23.71 kg/m   GEN: Pt is a 67 yo in NAD  HEENT: normal  Neck:   JVP is d    No  carotid bruits,  Cardiac: RRR; no murmurs.  No LE  edema    Respiratory:  clear to auscultation bilaterally, GI: soft, No masses , nondistended, + BS  No hepatomegaly  MS: no deformity Moving all extremities   Skin: warm and dry, no rash    EKG:  EKG is not ordered today.    Lipid Panel    Component Value Date/Time   CHOL 114 09/14/2021 1102   TRIG 147 09/14/2021 1102   HDL 36 (L) 09/14/2021 1102   CHOLHDL 3.2 09/14/2021 1102  CHOLHDL 5.3 (H) 12/29/2015 1146   VLDL 79 (H) 12/29/2015 1146   LDLCALC 53 09/14/2021 1102      Wt Readings from Last 3 Encounters:  12/13/22 170 lb (77.1 kg)  12/01/22 160 lb (72.6 kg)  09/20/22 163 lb 12.8 oz (74.3 kg)      ASSESSMENT AND PLAN: 1  Autonomic dysfunction Pt doing well on current regimen Occasional spells   Stays hydartd      2 CAD.  Mild plaqing on CT   Follow  No symptoms   Control risks  Lipdis good in December    3 dyslipidemia ON statin Follow in spring     4  Cr Labile   WIll check BMET today  Will check CBC as well    5  Hypokalemia  Check     F/U next summer    Current medicines are reviewed at length with the patient today.  The patient does not have concerns regarding medicines.  Signed, Dorris Carnes, MD  12/13/2022 12:28 PM    [address], [place], [place]  [address] Phone: [phone]; Fax: [phone]

## 2022-12-13 ENCOUNTER — Other Ambulatory Visit: Payer: Self-pay | Admitting: Student

## 2022-12-13 ENCOUNTER — Telehealth: Payer: Self-pay

## 2022-12-13 ENCOUNTER — Encounter: Payer: Self-pay | Admitting: Internal Medicine

## 2022-12-13 ENCOUNTER — Ambulatory Visit: Payer: 59 | Attending: Internal Medicine | Admitting: Internal Medicine

## 2022-12-13 VITALS — BP 140/80 | HR 86 | Wt 170.0 lb

## 2022-12-13 DIAGNOSIS — Z7982 Long term (current) use of aspirin: Secondary | ICD-10-CM | POA: Diagnosis not present

## 2022-12-13 DIAGNOSIS — I951 Orthostatic hypotension: Secondary | ICD-10-CM

## 2022-12-13 DIAGNOSIS — Z8669 Personal history of other diseases of the nervous system and sense organs: Secondary | ICD-10-CM | POA: Diagnosis not present

## 2022-12-13 DIAGNOSIS — S069XAS Unspecified intracranial injury with loss of consciousness status unknown, sequela: Secondary | ICD-10-CM

## 2022-12-13 DIAGNOSIS — R1310 Dysphagia, unspecified: Secondary | ICD-10-CM | POA: Diagnosis not present

## 2022-12-13 DIAGNOSIS — Z9981 Dependence on supplemental oxygen: Secondary | ICD-10-CM | POA: Diagnosis not present

## 2022-12-13 DIAGNOSIS — D649 Anemia, unspecified: Secondary | ICD-10-CM | POA: Diagnosis not present

## 2022-12-13 DIAGNOSIS — I10 Essential (primary) hypertension: Secondary | ICD-10-CM

## 2022-12-13 DIAGNOSIS — J302 Other seasonal allergic rhinitis: Secondary | ICD-10-CM | POA: Diagnosis not present

## 2022-12-13 DIAGNOSIS — E876 Hypokalemia: Secondary | ICD-10-CM | POA: Diagnosis not present

## 2022-12-13 DIAGNOSIS — M6282 Rhabdomyolysis: Secondary | ICD-10-CM | POA: Diagnosis not present

## 2022-12-13 DIAGNOSIS — G4733 Obstructive sleep apnea (adult) (pediatric): Secondary | ICD-10-CM | POA: Diagnosis not present

## 2022-12-13 DIAGNOSIS — Z87891 Personal history of nicotine dependence: Secondary | ICD-10-CM | POA: Diagnosis not present

## 2022-12-13 DIAGNOSIS — Z9181 History of falling: Secondary | ICD-10-CM | POA: Diagnosis not present

## 2022-12-13 DIAGNOSIS — E785 Hyperlipidemia, unspecified: Secondary | ICD-10-CM | POA: Diagnosis not present

## 2022-12-13 NOTE — Telephone Encounter (Signed)
Order placed

## 2022-12-13 NOTE — Telephone Encounter (Signed)
Anna Genre SLP calls nurse line requesting verbal orders for speech.  1x a week for 4 weeks   Verbal order given.

## 2022-12-13 NOTE — Patient Instructions (Signed)
Medication Instructions:   *If you need a refill on your cardiac medications before your next appointment, please call your pharmacy*   Lab Work: Lipid, bmet, UA today  If you have labs (blood work) drawn today and your tests are completely normal, you will receive your results only by: [place] (if you have MyChart) OR A paper copy in the mail If you have any lab test that is abnormal or we need to change your treatment, we will call you to review the results.   Testing/Procedures:    Follow-Up: At [facility], you and your health needs are our priority.  As part of our continuing mission to provide you with exceptional heart care, we have created designated Provider Care Teams.  These Care Teams include your primary Cardiologist (physician) and Advanced Practice Providers (APPs -  Physician Assistants and Nurse Practitioners) who all work together to provide you with the care you need, when you need it.  We recommend signing up for the patient portal called "MyChart".  Sign up information is provided on this After Visit Summary.  MyChart is used to connect with patients for Virtual Visits (Telemedicine).  Patients are able to view lab/test results, encounter notes, upcoming appointments, etc.  Non-urgent messages can be sent to your provider as well.   To learn more about what you can do with MyChart, go to [email or web address].    Your next appointment:   3 month(s)  Provider:   Dorris Carnes, MD     Other Instructions

## 2022-12-13 NOTE — Telephone Encounter (Signed)
Received VM from Anna Genre, [place] with [facility].   She is requesting order for modified barium swallow study.   Please place order if appropriate.   Talbot Grumbling, RN

## 2022-12-14 ENCOUNTER — Other Ambulatory Visit: Payer: Self-pay

## 2022-12-14 ENCOUNTER — Encounter: Payer: Self-pay | Admitting: Internal Medicine

## 2022-12-14 ENCOUNTER — Telehealth (HOSPITAL_COMMUNITY): Payer: Self-pay

## 2022-12-14 DIAGNOSIS — Z9981 Dependence on supplemental oxygen: Secondary | ICD-10-CM | POA: Diagnosis not present

## 2022-12-14 DIAGNOSIS — I10 Essential (primary) hypertension: Secondary | ICD-10-CM | POA: Diagnosis not present

## 2022-12-14 DIAGNOSIS — Z8669 Personal history of other diseases of the nervous system and sense organs: Secondary | ICD-10-CM | POA: Diagnosis not present

## 2022-12-14 DIAGNOSIS — D649 Anemia, unspecified: Secondary | ICD-10-CM | POA: Diagnosis not present

## 2022-12-14 DIAGNOSIS — Z87891 Personal history of nicotine dependence: Secondary | ICD-10-CM | POA: Diagnosis not present

## 2022-12-14 DIAGNOSIS — R1310 Dysphagia, unspecified: Secondary | ICD-10-CM | POA: Diagnosis not present

## 2022-12-14 DIAGNOSIS — J302 Other seasonal allergic rhinitis: Secondary | ICD-10-CM | POA: Diagnosis not present

## 2022-12-14 DIAGNOSIS — E785 Hyperlipidemia, unspecified: Secondary | ICD-10-CM | POA: Diagnosis not present

## 2022-12-14 DIAGNOSIS — M6282 Rhabdomyolysis: Secondary | ICD-10-CM | POA: Diagnosis not present

## 2022-12-14 DIAGNOSIS — I951 Orthostatic hypotension: Secondary | ICD-10-CM | POA: Diagnosis not present

## 2022-12-14 DIAGNOSIS — E876 Hypokalemia: Secondary | ICD-10-CM | POA: Diagnosis not present

## 2022-12-14 DIAGNOSIS — R8290 Unspecified abnormal findings in urine: Secondary | ICD-10-CM

## 2022-12-14 DIAGNOSIS — G4733 Obstructive sleep apnea (adult) (pediatric): Secondary | ICD-10-CM | POA: Diagnosis not present

## 2022-12-14 DIAGNOSIS — Z9181 History of falling: Secondary | ICD-10-CM | POA: Diagnosis not present

## 2022-12-14 DIAGNOSIS — Z7982 Long term (current) use of aspirin: Secondary | ICD-10-CM | POA: Diagnosis not present

## 2022-12-14 LAB — URINALYSIS, ROUTINE W REFLEX MICROSCOPIC
Glucose, UA: NEGATIVE
Ketones, UA: NEGATIVE
Nitrite, UA: NEGATIVE
Specific Gravity, UA: >=1.03 — AB (ref 1.005–1.030)
Urobilinogen, Ur: 0.2 mg/dL (ref 0.2–1.0)
pH, UA: 5 (ref 5.0–7.5)

## 2022-12-14 LAB — MICROSCOPIC EXAMINATION
Bacteria, UA: NONE SEEN
Casts: NONE SEEN /lpf
Epithelial Cells (non renal): NONE SEEN /hpf (ref 0–10)
RBC, Urine: >30 /hpf — AB (ref 0–2)

## 2022-12-14 LAB — LIPID PANEL
Chol/HDL Ratio: 3.7 ratio (ref 0.0–5.0)
Cholesterol, Total: 134 mg/dL (ref 100–199)
HDL: 36 mg/dL — ABNORMAL LOW (ref >39)
LDL Chol Calc (NIH): 72 mg/dL (ref 0–99)
Triglycerides: 146 mg/dL (ref 0–149)
VLDL Cholesterol Cal: 26 mg/dL (ref 5–40)

## 2022-12-14 LAB — BASIC METABOLIC PANEL
BUN/Creatinine Ratio: 9 — ABNORMAL LOW (ref 10–24)
BUN: 11 mg/dL (ref 8–27)
CO2: 19 mmol/L — ABNORMAL LOW (ref 20–29)
Calcium: 9.5 mg/dL (ref 8.6–10.2)
Chloride: 106 mmol/L (ref 96–106)
Creatinine, Ser: 1.23 mg/dL (ref 0.76–1.27)
Glucose: 105 mg/dL — ABNORMAL HIGH (ref 70–99)
Potassium: 4.6 mmol/L (ref 3.5–5.2)
Sodium: 140 mmol/L (ref 134–144)
eGFR: 66 mL/min/{1.73_m2} (ref >59)

## 2022-12-14 NOTE — Telephone Encounter (Signed)
Called and spoke with patient to schedule Modified Barium Swallow - patient stated he is able to swallow again and does not wish to schedule.   Ordered cancelled.

## 2022-12-14 NOTE — Telephone Encounter (Signed)
Returned call to [place] regarding order. She will reach out to scheduling at [facility]. If they need order faxed, she will return call to our office with fax number.   Talbot Grumbling, RN

## 2022-12-15 ENCOUNTER — Telehealth: Payer: Self-pay

## 2022-12-15 DIAGNOSIS — R8290 Unspecified abnormal findings in urine: Secondary | ICD-10-CM

## 2022-12-15 NOTE — Telephone Encounter (Signed)
Referral to [facility].  

## 2022-12-17 ENCOUNTER — Telehealth: Payer: Self-pay

## 2022-12-17 NOTE — Telephone Encounter (Signed)
Catherine from [facility] calling for PT verbal orders as follows:  1 time(s) weekly for 6 week(s)  Verbal orders given per [facility] protocol  Talbot Grumbling, RN

## 2022-12-20 DIAGNOSIS — Z7982 Long term (current) use of aspirin: Secondary | ICD-10-CM | POA: Diagnosis not present

## 2022-12-20 DIAGNOSIS — F0154 Vascular dementia, unspecified severity, with anxiety: Secondary | ICD-10-CM | POA: Diagnosis not present

## 2022-12-20 DIAGNOSIS — Z9981 Dependence on supplemental oxygen: Secondary | ICD-10-CM | POA: Diagnosis not present

## 2022-12-20 DIAGNOSIS — J302 Other seasonal allergic rhinitis: Secondary | ICD-10-CM | POA: Diagnosis not present

## 2022-12-20 DIAGNOSIS — R1310 Dysphagia, unspecified: Secondary | ICD-10-CM | POA: Diagnosis not present

## 2022-12-20 DIAGNOSIS — I951 Orthostatic hypotension: Secondary | ICD-10-CM | POA: Diagnosis not present

## 2022-12-20 DIAGNOSIS — Z466 Encounter for fitting and adjustment of urinary device: Secondary | ICD-10-CM | POA: Diagnosis not present

## 2022-12-20 DIAGNOSIS — M6282 Rhabdomyolysis: Secondary | ICD-10-CM | POA: Diagnosis not present

## 2022-12-20 DIAGNOSIS — E46 Unspecified protein-calorie malnutrition: Secondary | ICD-10-CM | POA: Diagnosis not present

## 2022-12-20 DIAGNOSIS — Z931 Gastrostomy status: Secondary | ICD-10-CM | POA: Diagnosis not present

## 2022-12-20 DIAGNOSIS — Z8669 Personal history of other diseases of the nervous system and sense organs: Secondary | ICD-10-CM | POA: Diagnosis not present

## 2022-12-20 DIAGNOSIS — N189 Chronic kidney disease, unspecified: Secondary | ICD-10-CM | POA: Diagnosis not present

## 2022-12-20 DIAGNOSIS — I129 Hypertensive chronic kidney disease with stage 1 through stage 4 chronic kidney disease, or unspecified chronic kidney disease: Secondary | ICD-10-CM | POA: Diagnosis not present

## 2022-12-20 DIAGNOSIS — D631 Anemia in chronic kidney disease: Secondary | ICD-10-CM | POA: Diagnosis not present

## 2022-12-20 DIAGNOSIS — E785 Hyperlipidemia, unspecified: Secondary | ICD-10-CM | POA: Diagnosis not present

## 2022-12-20 DIAGNOSIS — G2581 Restless legs syndrome: Secondary | ICD-10-CM | POA: Diagnosis not present

## 2022-12-20 DIAGNOSIS — F0153 Vascular dementia, unspecified severity, with mood disturbance: Secondary | ICD-10-CM | POA: Diagnosis not present

## 2022-12-20 DIAGNOSIS — E1122 Type 2 diabetes mellitus with diabetic chronic kidney disease: Secondary | ICD-10-CM | POA: Diagnosis not present

## 2022-12-20 DIAGNOSIS — E876 Hypokalemia: Secondary | ICD-10-CM | POA: Diagnosis not present

## 2022-12-20 DIAGNOSIS — G4733 Obstructive sleep apnea (adult) (pediatric): Secondary | ICD-10-CM | POA: Diagnosis not present

## 2022-12-22 ENCOUNTER — Encounter: Payer: Self-pay | Admitting: Internal Medicine

## 2022-12-22 ENCOUNTER — Ambulatory Visit (AMBULATORY_SURGERY_CENTER): Payer: 59 | Admitting: Internal Medicine

## 2022-12-22 VITALS — BP 128/71 | HR 78 | Temp 98.2°F | Resp 20 | Ht 71.0 in | Wt 160.0 lb

## 2022-12-22 DIAGNOSIS — Z1211 Encounter for screening for malignant neoplasm of colon: Secondary | ICD-10-CM | POA: Diagnosis not present

## 2022-12-22 MED ORDER — SODIUM CHLORIDE 0.9 % IV SOLN: 500.0000 mL | Freq: Once | INTRAVENOUS | Status: DC

## 2022-12-22 NOTE — Patient Instructions (Signed)
Handout on diverticulosis given to patient.  Resume previous diet and continue present medications. Repeat colonoscopy in 10 years for surveillance!  YOU HAD AN ENDOSCOPIC PROCEDURE TODAY AT [facility]:   Refer to the procedure report that was given to you for any specific questions about what was found during the examination.  If the procedure report does not answer your questions, please call your gastroenterologist to clarify.  If you requested that your care partner not be given the details of your procedure findings, then the procedure report has been included in a sealed envelope for you to review at your convenience later.  YOU SHOULD EXPECT: Some feelings of bloating in the abdomen. Passage of more gas than usual.  Walking can help get rid of the air that was put into your GI tract during the procedure and reduce the bloating. If you had a lower endoscopy (such as a colonoscopy or flexible sigmoidoscopy) you may notice spotting of blood in your stool or on the toilet paper. If you underwent a bowel prep for your procedure, you may not have a normal bowel movement for a few days.  Please Note:  You might notice some irritation and congestion in your nose or some drainage.  This is from the oxygen used during your procedure.  There is no need for concern and it should clear up in a day or so.  SYMPTOMS TO REPORT IMMEDIATELY:  Following lower endoscopy (colonoscopy or flexible sigmoidoscopy):  Excessive amounts of blood in the stool  Significant tenderness or worsening of abdominal pains  Swelling of the abdomen that is new, acute  Fever of 100F or higher  For urgent or emergent issues, a gastroenterologist can be reached at any hour by calling [phone]. Do not use MyChart messaging for urgent concerns.    DIET:  We do recommend a small meal at first, but then you may proceed to your regular diet.  Drink plenty of fluids but you should avoid alcoholic beverages for  24 hours.  ACTIVITY:  You should plan to take it easy for the rest of today and you should NOT DRIVE or use heavy machinery until tomorrow (because of the sedation medicines used during the test).    FOLLOW UP: Our staff will call the number listed on your records the next business day following your procedure.  We will call around 7:15- 8:00 am to check on you and address any questions or concerns that you may have regarding the information given to you following your procedure. If we do not reach you, we will leave a message.     If any biopsies were taken you will be contacted by phone or by letter within the next 1-3 weeks.  Please call us at [phone] if you have not heard about the biopsies in 3 weeks.    SIGNATURES/CONFIDENTIALITY: You and/or your care partner have signed paperwork which will be entered into your electronic medical record.  These signatures attest to the fact that that the information above on your After Visit Summary has been reviewed and is understood.  Full responsibility of the confidentiality of this discharge information lies with you and/or your care-partner.

## 2022-12-22 NOTE — Progress Notes (Signed)
Pt's states no medical or surgical changes since previsit or office visit. 

## 2022-12-22 NOTE — Op Note (Signed)
[place] Patient Name: Jose Li Procedure Date: 12/22/2022 10:37 AM MRN: [id number] Endoscopist: Docia Chuck. Henrene Pastor , MD, [id number] Age: 67 Referring MD:  Date of Birth: [birth year] Gender: Male Account #: [id number] Procedure:                Colonoscopy Indications:              Screening for colorectal malignant neoplasm.                            Negative index exam 2012 Medicines:                Monitored Anesthesia Care Procedure:                Pre-Anesthesia Assessment:                           - Prior to the procedure, a History and Physical                            was performed, and patient medications and                            allergies were reviewed. The patient's tolerance of                            previous anesthesia was also reviewed. The risks                            and benefits of the procedure and the sedation                            options and risks were discussed with the patient.                            All questions were answered, and informed consent                            was obtained. Prior Anticoagulants: The patient has                            taken no anticoagulant or antiplatelet agents. ASA                            Grade Assessment: II - A patient with mild systemic                            disease. After reviewing the risks and benefits,                            the patient was deemed in satisfactory condition to                            undergo the procedure.  After obtaining informed consent, the colonoscope                            was passed under direct vision. Throughout the                            procedure, the patient's blood pressure, pulse, and                            oxygen saturations were monitored continuously. The                            CF [id number] #[id number] was introduced through the anus                            and advanced to the the cecum, identified  by                            appendiceal orifice and ileocecal valve. The                            ileocecal valve, appendiceal orifice, and rectum                            were photographed. The quality of the bowel                            preparation was good. The colonoscopy was performed                            without difficulty. The patient tolerated the                            procedure well. The bowel preparation used was                            SUPREP via split dose instruction. Scope In: 11:04:37 AM Scope Out: 11:18:29 AM Scope Withdrawal Time: 0 hours 11 minutes 22 seconds  Total Procedure Duration: 0 hours 13 minutes 52 seconds  Findings:                 Multiple diverticula were found in the sigmoid                            colon.                           The exam was otherwise without abnormality on                            direct and retroflexion views. Complications:            No immediate complications. Estimated blood loss:  None. Estimated Blood Loss:     Estimated blood loss: none. Impression:               - Diverticulosis in the sigmoid colon.                           - The examination was otherwise normal on direct                            and retroflexion views.                           - No specimens collected. Recommendation:           - Repeat colonoscopy in 10 years for screening                            purposes.                           - Patient has a contact number available for                            emergencies. The signs and symptoms of potential                            delayed complications were discussed with the                            patient. Return to normal activities tomorrow.                            Written discharge instructions were provided to the                            patient.                           - Resume previous diet.                           - Continue  present medications. Docia Chuck. Henrene Pastor, MD 12/22/2022 11:22:10 AM This report has been signed electronically.

## 2022-12-22 NOTE — Progress Notes (Signed)
HISTORY OF PRESENT ILLNESS:  Jose Li is a 67 y.o. male who presents today for screening colonoscopy.  Index exam 2012 revealed diverticulosis.  No neoplasia.  REVIEW OF SYSTEMS:  All non-GI ROS negative except for  Past Medical History:  Diagnosis Date   Acute kidney injury ([place]) 05/26/2018   Allergy    Anemia    Anxiety    Arthritis    LEGS,BACK   Cataract    BILATERAL   Depression    Hyperlipidemia    Hypertension, essential, benign 08/02/2012   Orthostatic hypotension 10/09/2013   OSA (obstructive sleep apnea) 10/01/2015   Severe OSA with an AHI of 86/hr now on BiPAP at 23/19cm H2O   Rhabdomyolysis 08/24/2022   Seizures ([place])    none for last 6-38months   Sleep apnea    Substance abuse ([place])     Past Surgical History:  Procedure Laterality Date   COLONOSCOPY     CRANIOTOMY Left 06/08/2022   Procedure: CRANIOTOMY HEMATOMA EVACUATION SUBDURAL;  Surgeon: Earnie Larsson, MD;  Location: [facility];  Service: Neurosurgery;  Laterality: Left;   FRACTURE SURGERY  1990   right hip and femur   HERNIA REPAIR  29/2446   umbilical hernia/ got infected   JOINT REPLACEMENT Right 1990   right hip   TRACHEOSTOMY  08/1999   Had pneumoniaand was in a coma 10 days    Social History Jose Li  reports that he has been smoking cigarettes. He has a 30.00 pack-year smoking history. He has been exposed to tobacco smoke. He has never used smokeless tobacco. He reports that he does not currently use drugs. He reports that he does not drink alcohol.  family history includes Diabetes in his father; Heart disease in his brother and mother.  Allergies  Allergen Reactions   Cymbalta [Duloxetine Hcl] Other (See Comments)    Hyponatremia    Erythromycin Other (See Comments)    Unknown reation   Hctz [Hydrochlorothiazide] Other (See Comments)    Syncope   Inderal [Propranolol] Other (See Comments)    Syncope        PHYSICAL EXAMINATION: Vital signs: BP 129/73   Pulse 96   Temp  98.2 F (36.8 C)   Ht 5\' 11"  (1.803 m)   Wt 160 lb (72.6 kg)   SpO2 99%   BMI 22.32 kg/m  General: Well-developed, well-nourished, no acute distress HEENT: Sclerae are anicteric, conjunctiva pink. Oral mucosa intact Lungs: Clear Heart: Regular Abdomen: soft, nontender, nondistended, no obvious ascites, no peritoneal signs, normal bowel sounds. No organomegaly. Extremities: No edema Psychiatric: alert and oriented x3. Cooperative     ASSESSMENT:   Colon cancer screening  PLAN:  Screening colonoscopy

## 2022-12-22 NOTE — Progress Notes (Signed)
Vss nad trans to pacu °

## 2022-12-23 ENCOUNTER — Telehealth: Payer: Self-pay | Admitting: *Deleted

## 2022-12-23 NOTE — Telephone Encounter (Signed)
  Follow up Call-     12/22/2022   10:10 AM  Call back number  Post procedure Call Back phone  # [phone]  Permission to leave phone message Yes     Patient questions:  Do you have a fever, pain , or abdominal swelling? No. Pain Score  0 *  Have you tolerated food without any problems? Yes.    Have you been able to return to your normal activities? Yes.    Do you have any questions about your discharge instructions: Diet   No. Medications  No. Follow up visit  No.  Do you have questions or concerns about your Care? No.  Actions: * If pain score is 4 or above: No action needed, pain <4.

## 2022-12-24 DIAGNOSIS — D631 Anemia in chronic kidney disease: Secondary | ICD-10-CM | POA: Diagnosis not present

## 2022-12-24 DIAGNOSIS — E1122 Type 2 diabetes mellitus with diabetic chronic kidney disease: Secondary | ICD-10-CM | POA: Diagnosis not present

## 2022-12-24 DIAGNOSIS — N189 Chronic kidney disease, unspecified: Secondary | ICD-10-CM | POA: Diagnosis not present

## 2022-12-24 DIAGNOSIS — Z8669 Personal history of other diseases of the nervous system and sense organs: Secondary | ICD-10-CM | POA: Diagnosis not present

## 2022-12-24 DIAGNOSIS — Z9981 Dependence on supplemental oxygen: Secondary | ICD-10-CM | POA: Diagnosis not present

## 2022-12-24 DIAGNOSIS — Z466 Encounter for fitting and adjustment of urinary device: Secondary | ICD-10-CM | POA: Diagnosis not present

## 2022-12-24 DIAGNOSIS — J302 Other seasonal allergic rhinitis: Secondary | ICD-10-CM | POA: Diagnosis not present

## 2022-12-24 DIAGNOSIS — Z931 Gastrostomy status: Secondary | ICD-10-CM | POA: Diagnosis not present

## 2022-12-24 DIAGNOSIS — Z7982 Long term (current) use of aspirin: Secondary | ICD-10-CM | POA: Diagnosis not present

## 2022-12-24 DIAGNOSIS — E46 Unspecified protein-calorie malnutrition: Secondary | ICD-10-CM | POA: Diagnosis not present

## 2022-12-24 DIAGNOSIS — G2581 Restless legs syndrome: Secondary | ICD-10-CM | POA: Diagnosis not present

## 2022-12-24 DIAGNOSIS — I951 Orthostatic hypotension: Secondary | ICD-10-CM | POA: Diagnosis not present

## 2022-12-24 DIAGNOSIS — R1310 Dysphagia, unspecified: Secondary | ICD-10-CM | POA: Diagnosis not present

## 2022-12-24 DIAGNOSIS — F0153 Vascular dementia, unspecified severity, with mood disturbance: Secondary | ICD-10-CM | POA: Diagnosis not present

## 2022-12-24 DIAGNOSIS — M6282 Rhabdomyolysis: Secondary | ICD-10-CM | POA: Diagnosis not present

## 2022-12-24 DIAGNOSIS — I129 Hypertensive chronic kidney disease with stage 1 through stage 4 chronic kidney disease, or unspecified chronic kidney disease: Secondary | ICD-10-CM | POA: Diagnosis not present

## 2022-12-24 DIAGNOSIS — G4733 Obstructive sleep apnea (adult) (pediatric): Secondary | ICD-10-CM | POA: Diagnosis not present

## 2022-12-24 DIAGNOSIS — F0154 Vascular dementia, unspecified severity, with anxiety: Secondary | ICD-10-CM | POA: Diagnosis not present

## 2022-12-24 DIAGNOSIS — E876 Hypokalemia: Secondary | ICD-10-CM | POA: Diagnosis not present

## 2022-12-24 DIAGNOSIS — E785 Hyperlipidemia, unspecified: Secondary | ICD-10-CM | POA: Diagnosis not present

## 2022-12-27 DIAGNOSIS — I951 Orthostatic hypotension: Secondary | ICD-10-CM | POA: Diagnosis not present

## 2022-12-27 DIAGNOSIS — D631 Anemia in chronic kidney disease: Secondary | ICD-10-CM | POA: Diagnosis not present

## 2022-12-27 DIAGNOSIS — R1310 Dysphagia, unspecified: Secondary | ICD-10-CM | POA: Diagnosis not present

## 2022-12-27 DIAGNOSIS — Z931 Gastrostomy status: Secondary | ICD-10-CM | POA: Diagnosis not present

## 2022-12-27 DIAGNOSIS — G4733 Obstructive sleep apnea (adult) (pediatric): Secondary | ICD-10-CM | POA: Diagnosis not present

## 2022-12-27 DIAGNOSIS — J302 Other seasonal allergic rhinitis: Secondary | ICD-10-CM | POA: Diagnosis not present

## 2022-12-27 DIAGNOSIS — Z466 Encounter for fitting and adjustment of urinary device: Secondary | ICD-10-CM | POA: Diagnosis not present

## 2022-12-27 DIAGNOSIS — I129 Hypertensive chronic kidney disease with stage 1 through stage 4 chronic kidney disease, or unspecified chronic kidney disease: Secondary | ICD-10-CM | POA: Diagnosis not present

## 2022-12-27 DIAGNOSIS — F0153 Vascular dementia, unspecified severity, with mood disturbance: Secondary | ICD-10-CM | POA: Diagnosis not present

## 2022-12-27 DIAGNOSIS — Z9981 Dependence on supplemental oxygen: Secondary | ICD-10-CM | POA: Diagnosis not present

## 2022-12-27 DIAGNOSIS — G2581 Restless legs syndrome: Secondary | ICD-10-CM | POA: Diagnosis not present

## 2022-12-27 DIAGNOSIS — Z7982 Long term (current) use of aspirin: Secondary | ICD-10-CM | POA: Diagnosis not present

## 2022-12-27 DIAGNOSIS — M6282 Rhabdomyolysis: Secondary | ICD-10-CM | POA: Diagnosis not present

## 2022-12-27 DIAGNOSIS — E46 Unspecified protein-calorie malnutrition: Secondary | ICD-10-CM | POA: Diagnosis not present

## 2022-12-27 DIAGNOSIS — Z8669 Personal history of other diseases of the nervous system and sense organs: Secondary | ICD-10-CM | POA: Diagnosis not present

## 2022-12-27 DIAGNOSIS — F0154 Vascular dementia, unspecified severity, with anxiety: Secondary | ICD-10-CM | POA: Diagnosis not present

## 2022-12-27 DIAGNOSIS — N189 Chronic kidney disease, unspecified: Secondary | ICD-10-CM | POA: Diagnosis not present

## 2022-12-27 DIAGNOSIS — E876 Hypokalemia: Secondary | ICD-10-CM | POA: Diagnosis not present

## 2022-12-27 DIAGNOSIS — E785 Hyperlipidemia, unspecified: Secondary | ICD-10-CM | POA: Diagnosis not present

## 2022-12-27 DIAGNOSIS — E1122 Type 2 diabetes mellitus with diabetic chronic kidney disease: Secondary | ICD-10-CM | POA: Diagnosis not present

## 2022-12-30 ENCOUNTER — Ambulatory Visit (INDEPENDENT_AMBULATORY_CARE_PROVIDER_SITE_OTHER): Payer: 59 | Admitting: Neurology

## 2022-12-30 ENCOUNTER — Encounter: Payer: Self-pay | Admitting: Neurology

## 2022-12-30 VITALS — BP 115/69 | HR 94 | Ht 71.0 in | Wt 173.0 lb

## 2022-12-30 DIAGNOSIS — S065XAA Traumatic subdural hemorrhage with loss of consciousness status unknown, initial encounter: Secondary | ICD-10-CM | POA: Diagnosis not present

## 2022-12-30 DIAGNOSIS — G40909 Epilepsy, unspecified, not intractable, without status epilepticus: Secondary | ICD-10-CM

## 2022-12-30 MED ORDER — LEVETIRACETAM 750 MG PO TABS: 1500.0000 mg | ORAL_TABLET | Freq: Two times a day (BID) | ORAL | 4 refills | Status: DC

## 2022-12-30 NOTE — Progress Notes (Signed)
PATIENT: Jose Li DOB: 10-Jan-1956  REASON FOR VISIT: follow up for seizures HISTORY FROM: patient PRIMARY NEUROLOGIST: Dr. April Manson  HISTORY OF PRESENT ILLNESS: Today 12/30/22 Here today for follow-up, admitted 06/07/2022 for left-sided subdural hematoma with craniotomy, his hospitalization was complicated by delirium.  Discharged to skilled nursing facility.  Admitted in October for orthostatic hypotension.  Follows with cardiology for autonomic dysfunction. Alone today, back at his home. He mentions the fall he had in July, he thinks he fell down the stairs, his girlfriend found him, went to the ER, found subdural hematoma. Remains on Keppra 1500 mg twice daily, Lyrica 75 mg twice daily. He wonders if his fall was due to seizure, he thinks he was missing doses of his medication at that time. He didn't have an ID to get the Lyrica at the time. He doesn't drive. He claims he is able to manage his own medications. Also seems cardiology for passing out spells. He is currently in PT/ST at his home. Under a lot of stress, managing money, financial. He denies any alcohol use. He goes to [facility] for his Bipolar Disorder. He does smack his lips, he says due to dentures slipping.   Update 12/29/21 SS: [name] is here today for follow-up for seizures. His father passed away last week at age of 65. Armahni is going to move to an apartment. He is under a lot of stress. Remains on Lyrica and Keppra. At times he might feel a seizure coming on, he will lay down. In the past has had grand mal seizures, can't remember the last time. Doesn't have a drivers license. No major health issues. Had a passing out spell back in November, December, with rib injury. On midodrine from cardiology.   Update 12/29/2020 SS: [name] is a 67 year old male with history of alcohol abuse and seizures.  Also history of orthostatic hypotension and OSA on BiPAP.  He remains on Keppra and Lyrica. No recurrent seizure.  He does not drive, he  lives with his father who is 94.  He is followed by cardiology for orthostatic hypotension, denies any recent syncopal spells.  He has no complaints today.  His father drove him today, he is on disability, is not very active during the day, mostly stays home, takes care of cats.  Reports only occasional alcohol use.  Here today for evaluation unaccompanied.  HISTORY  11/13/2019 SS: [name] is a 67 year old male with history of alcohol abuse and history of seizures.  He also has history of orthostatic hypotension and OSA treated with BiPAP.  He remains on Keppra 750 mg tablets, 2 tablets twice a day.  He is also taking Lyrica 75 mg twice daily.  He reports he has not had recurrent seizure.  He says he has had 2 episodes of dizziness, likely related to his autonomic dysfunction, but no blackouts.  He says he is no longer drinking alcohol.  He does not drive a car.  He lives with his father who is 30 years old.  He says he is able to manage his medications.  He indicates his overall health has been well.  He is on disability, he does not do much during the day.  He presents today for evaluation unaccompanied.  REVIEW OF SYSTEMS: Out of a complete 14 system review of symptoms, the patient complains only of the following symptoms, and all other reviewed systems are negative.  See HPI  ALLERGIES: Allergies  Allergen Reactions   Cymbalta [Duloxetine Hcl] Other (See  Comments)    Hyponatremia    Erythromycin Other (See Comments)    Unknown reation   Hctz [Hydrochlorothiazide] Other (See Comments)    Syncope   Inderal [Propranolol] Other (See Comments)    Syncope     HOME MEDICATIONS: Outpatient Medications Prior to Visit  Medication Sig Dispense Refill   acetaminophen (TYLENOL) 325 MG tablet Take 2 tablets (650 mg total) by mouth every 4 (four) hours as needed for mild pain (temp > 100.5).     aspirin 81 MG EC tablet Take 81 mg by mouth daily.     atorvastatin (LIPITOR) 20 MG tablet Take 1  tablet by mouth once daily (Patient taking differently: Take 20 mg by mouth daily.) 90 tablet 0   benztropine (COGENTIN) 1 MG tablet Take 1 mg by mouth at bedtime.     busPIRone (BUSPAR) 5 MG tablet Take 5 mg by mouth 2 (two) times daily.      cetirizine (ZYRTEC) 10 MG tablet Take 10 mg by mouth daily.     citalopram (CELEXA) 20 MG tablet Take 20 mg by mouth daily.     Ensure (ENSURE) Take 237 mLs by mouth 2 (two) times daily as needed (low apetite). Chocolate     midodrine (PROAMATINE) 5 MG tablet TAKE 2 TABLETS BY MOUTH ONCE DAILY AT 6 AM, THEN TAKE 1 TABLET BY MOUTH AT 10 AM, THEN TAKE 1 TABLET AT 2 PM 360 tablet 0   Multiple Vitamin (MULTIVITAMIN) tablet Take 1 tablet by mouth daily.     potassium chloride SA (KLOR-CON) 20 MEQ tablet Take 20 mEq by mouth daily.     pregabalin (LYRICA) 75 MG capsule Take 1 capsule (75 mg total) by mouth 2 (two) times daily. 180 capsule 1   risperiDONE (RISPERDAL) 2 MG tablet Take 2 mg by mouth at bedtime.      tamsulosin (FLOMAX) 0.4 MG CAPS capsule Take 0.4 mg by mouth daily after supper.     thiamine 250 MG tablet Take 250 mg by mouth daily.     traZODone (DESYREL) 150 MG tablet Take 150 mg by mouth at bedtime.      levETIRAcetam (KEPPRA) 750 MG tablet Take 2 tablets (1,500 mg total) by mouth 2 (two) times daily. 360 tablet 4   No facility-administered medications prior to visit.    PAST MEDICAL HISTORY: Past Medical History:  Diagnosis Date   Acute kidney injury ([place]) 05/26/2018   Allergy    Anemia    Anxiety    Arthritis    LEGS,BACK   Cataract    BILATERAL   Depression    Hyperlipidemia    Hypertension, essential, benign 08/02/2012   Orthostatic hypotension 10/09/2013   OSA (obstructive sleep apnea) 10/01/2015   Severe OSA with an AHI of 86/hr now on BiPAP at 23/19cm H2O   Rhabdomyolysis 08/24/2022   Seizures ([place])    none for last 6-67month   Sleep apnea    Substance abuse ([place]     PAST SURGICAL HISTORY: Past Surgical History:   Procedure Laterality Date   COLONOSCOPY     CRANIOTOMY Left 06/08/2022   Procedure: CRANIOTOMY HEMATOMA EVACUATION SUBDURAL;  Surgeon: PEarnie Larsson MD;  [facility] [facility]  Service: Neurosurgery;  Laterality: Left;   FRACTURE SURGERY  1990   right hip and femur   HERNIA REPAIR  [id number]  umbilical hernia/ got infected   JOINT REPLACEMENT Right 1990   right hip   TRACHEOSTOMY  08/1999   Had pneumoniaand was in a  coma 10 days    FAMILY HISTORY: Family History  Problem Relation Age of Onset   Heart disease Mother    Diabetes Father    Heart disease Brother    Hypertension Neg Hx    Stroke Neg Hx    Colon cancer Neg Hx    Colon polyps Neg Hx    Crohn's disease Neg Hx    Esophageal cancer Neg Hx    Rectal cancer Neg Hx    Stomach cancer Neg Hx    Ulcerative colitis Neg Hx     SOCIAL HISTORY: Social History   Socioeconomic History   Marital status: Divorced    Spouse name: Not on file   Number of children: 1   Years of education: 14   Highest education level: Associate degree: occupational, Hotel manager, or vocational program  Occupational History   Occupation: diability  Tobacco Use   Smoking status: Every Day    Packs/day: 1.00    Years: 30.00    Total pack years: 30.00    Types: Cigarettes    Passive exposure: Current   Smokeless tobacco: Never  Vaping Use   Vaping Use: Never used  Substance and Sexual Activity   Alcohol use: No    Comment: history of alcohol abuse.   Drug use: Not Currently    Comment: Hx of cocaine use "years ago"   Sexual activity: Not Currently  Other Topics Concern   Not on file  Social History Narrative   Patient lives alone. His father recently passed away.    A lot of stressors are coming from his passing.    Patient can not drive. Utilizes friends, taxis or medicare transportation.    Enjoys watching tv and working in the yard.    Social Determinants of Health   Financial Resource Strain: Low Risk  (01/20/2022)   Overall Financial  Resource Strain (CARDIA)    Difficulty of Paying Living Expenses: Not hard at all  Food Insecurity: No Food Insecurity (08/24/2022)   Hunger Vital Sign    Worried About Running Out of Food in the Last Year: Never true    Ran Out of Food in the Last Year: Never true  Transportation Needs: Unmet Transportation Needs (08/24/2022)   PRAPARE - Hydrologist (Medical): Yes    Lack of Transportation (Non-Medical): Yes  Physical Activity: Sufficiently Active (01/20/2022)   Exercise Vital Sign    Days of Exercise per Week: 5 days    Minutes of Exercise per Session: 30 min  Stress: Stress Concern Present (01/20/2022)   [place]    Feeling of Stress : Very much  Social Connections: Socially Isolated (01/20/2022)   Social Connection and Isolation Panel [NHANES]    Frequency of Communication with Friends and Family: More than three times a week    Frequency of Social Gatherings with Friends and Family: More than three times a week    Attends Religious Services: Never    Marine scientist or Organizations: No    Attends Archivist Meetings: Never    Marital Status: Divorced  Human resources officer Violence: Not At Risk (08/24/2022)   Humiliation, Afraid, Rape, and Kick questionnaire    Fear of Current or Ex-Partner: No    Emotionally Abused: No    Physically Abused: No    Sexually Abused: No   PHYSICAL EXAM  Vitals:   12/30/22 1023  BP: 115/69  Pulse: 94  Weight:  173 lb (78.5 kg)  Height: 5' 11"$  (1.803 m)    Body mass index is 24.13 kg/m.  Generalized: Well developed, in no acute distress  Neurological examination  Mentation: Alert oriented to time, place, history taking. Follows all commands speech and language fluent. Quiet, speech has slight slur  Cranial nerve II-XII: Pupils were equal round reactive to light. Extraocular movements were full, visual field were full on  confrontational test. Facial sensation and strength were normal. Head turning and shoulder shrug  were normal and symmetric. Noted to be smacking his lips claims to keep dentures in place  Motor: The motor testing reveals 5 over 5 strength of all 4 extremities. Good symmetric motor tone is noted throughout.  Sensory: Sensory testing is intact to soft touch on all 4 extremities. No evidence of extinction is noted.  Coordination: Cerebellar testing reveals good finger-nose-finger and heel-to-shin bilaterally.  Gait and station: Gait is normal. Tandem gait is unsteady Reflexes: Deep tendon reflexes are symmetric and normal bilaterally.   DIAGNOSTIC DATA (LABS, IMAGING, TESTING) - I reviewed patient records, labs, notes, testing and imaging myself where available.  Lab Results  Component Value Date   WBC 9.4 08/25/2022   HGB 11.2 (L) 08/25/2022   HCT 34.6 (L) 08/25/2022   MCV 84.4 08/25/2022   PLT 330 08/25/2022      Component Value Date/Time   NA 140 12/13/2022 1246   K 4.6 12/13/2022 1246   CL 106 12/13/2022 1246   CO2 19 (L) 12/13/2022 1246   GLUCOSE 105 (H) 12/13/2022 1246   GLUCOSE 103 (H) 08/27/2022 0507   BUN 11 12/13/2022 1246   CREATININE 1.23 12/13/2022 1246   CREATININE 0.94 06/10/2016 1613   CALCIUM 9.5 12/13/2022 1246   PROT 5.2 (L) 08/25/2022 0500   PROT 6.6 01/23/2018 1022   ALBUMIN 2.7 (L) 08/25/2022 0500   ALBUMIN 4.3 01/23/2018 1022   AST 26 08/25/2022 0500   ALT 19 08/25/2022 0500   ALKPHOS 75 08/25/2022 0500   BILITOT 0.4 08/25/2022 0500   BILITOT 0.3 01/23/2018 1022   GFRNONAA >60 08/27/2022 0507   GFRNONAA 84 02/17/2016 1011   GFRAA 65 10/17/2020 1013   GFRAA >89 02/17/2016 1011   Lab Results  Component Value Date   CHOL 134 12/13/2022   HDL 36 (L) 12/13/2022   LDLCALC 72 12/13/2022   TRIG 146 12/13/2022   CHOLHDL 3.7 12/13/2022   Lab Results  Component Value Date   HGBA1C 4.6 (L) 06/12/2022   Lab Results  Component Value Date   VITAMINB12  473 02/06/2016   Lab Results  Component Value Date   TSH 1.518 02/06/2016   ASSESSMENT AND PLAN 67 y.o. year old male  has a past medical history of Acute kidney injury ([place]) (05/26/2018), Allergy, Anemia, Anxiety, Arthritis, Cataract, Depression, Hyperlipidemia, Hypertension, essential, benign (08/02/2012), Orthostatic hypotension (10/09/2013), OSA (obstructive sleep apnea) (10/01/2015), Rhabdomyolysis (08/24/2022), Seizures ([place]), Sleep apnea, and Substance abuse ([place]). here with:  1.  History of seizures 2.  Orthostatic hypotension 3.  Subdural hematoma post craniotomy July 2023 4.  Bipolar disorder  Ranbir's 26 year old father was his caregiver, who passed away last year, over the last year it appears Acy has had some hospitalizations, including a fall in July with subdural hematoma requiring craniotomy, he spent some time at a skilled nursing facility.  He had another hospitalization for orthostatic hypotension.  He is now back home.  Has in-home PT/OT.  He is a poor historian, has some difficulty managing his stress and  independent living. I see he has a care coordinator checking on him.  He denies any recent seizure events, but potentially the fall he suffered in July may have been due to missing doses of his seizure medication, thus resulting in a seizure?  I going to update routine labs including a Keppra level.  We discussed the importance of taking medication as prescribed, not to miss any doses.  I will check an EEG, I do not see any prior EEGs available for review.  He reports his seizures have been generalized in the past.  He does not drive a car.  I will have him follow-up in 6 months with Dr. April Manson to establish care since Dr. Jannifer Franklin has retired.  I refilled his Keppra.  Just picked up Lyrica 12/11/22 # 180 capsules for 53-monthsupply, will be due for refill after that.   Meds ordered this encounter  Medications   levETIRAcetam (KEPPRA) 750 MG tablet    Sig: Take 2 tablets (1,500 mg  total) by mouth 2 (two) times daily.    Dispense:  360 tablet    Refill:  4   Orders Placed This Encounter  Procedures   CBC with Differential/Platelet   CMP   Levetiracetam level   EEG adult   SButler Denmark AGNP-C, DNP 12/30/2022, 11:05 AM [facility] [address] [place] [phone]

## 2022-12-30 NOTE — Patient Instructions (Signed)
We will continue current medications, check labs today  Make sure not to miss any doses of medication Check EEG Call for seizures  See you back in 6 months

## 2022-12-31 ENCOUNTER — Other Ambulatory Visit: Payer: Self-pay | Admitting: *Deleted

## 2022-12-31 LAB — CBC WITH DIFFERENTIAL/PLATELET
Basophils Absolute: 0.1 10*3/uL (ref 0.0–0.2)
Basos: 1 %
EOS (ABSOLUTE): 0.1 10*3/uL (ref 0.0–0.4)
Eos: 1 %
Hematocrit: 42.7 % (ref 37.5–51.0)
Hemoglobin: 14.2 g/dL (ref 13.0–17.7)
Immature Grans (Abs): 0 10*3/uL (ref 0.0–0.1)
Immature Granulocytes: 0 %
Lymphocytes Absolute: 1.6 10*3/uL (ref 0.7–3.1)
Lymphs: 20 %
MCH: 27.7 pg (ref 26.6–33.0)
MCHC: 33.3 g/dL (ref 31.5–35.7)
MCV: 83 fL (ref 79–97)
Monocytes Absolute: 0.6 10*3/uL (ref 0.1–0.9)
Monocytes: 8 %
Neutrophils Absolute: 5.5 10*3/uL (ref 1.4–7.0)
Neutrophils: 70 %
Platelets: 323 10*3/uL (ref 150–450)
RBC: 5.13 x10E6/uL (ref 4.14–5.80)
RDW: 14.1 % (ref 11.6–15.4)
WBC: 7.8 10*3/uL (ref 3.4–10.8)

## 2022-12-31 LAB — COMPREHENSIVE METABOLIC PANEL
ALT: 14 IU/L (ref 0–44)
AST: 17 IU/L (ref 0–40)
Albumin/Globulin Ratio: 1.8 (ref 1.2–2.2)
Albumin: 4.5 g/dL (ref 3.9–4.9)
Alkaline Phosphatase: 105 IU/L (ref 44–121)
BUN/Creatinine Ratio: 5 — ABNORMAL LOW (ref 10–24)
BUN: 6 mg/dL — ABNORMAL LOW (ref 8–27)
Bilirubin Total: 0.4 mg/dL (ref 0.0–1.2)
CO2: 21 mmol/L (ref 20–29)
Calcium: 9.5 mg/dL (ref 8.6–10.2)
Chloride: 103 mmol/L (ref 96–106)
Creatinine, Ser: 1.17 mg/dL (ref 0.76–1.27)
Globulin, Total: 2.5 g/dL (ref 1.5–4.5)
Glucose: 92 mg/dL (ref 70–99)
Potassium: 4.9 mmol/L (ref 3.5–5.2)
Sodium: 140 mmol/L (ref 134–144)
Total Protein: 7 g/dL (ref 6.0–8.5)
eGFR: 70 mL/min/{1.73_m2} (ref >59)

## 2022-12-31 LAB — LEVETIRACETAM LEVEL: Levetiracetam Lvl: 63.5 ug/mL — ABNORMAL HIGH (ref 10.0–40.0)

## 2022-12-31 MED ORDER — MIDODRINE HCL 5 MG PO TABS: ORAL_TABLET | ORAL | 3 refills | Status: DC

## 2023-01-03 ENCOUNTER — Other Ambulatory Visit: Payer: Self-pay

## 2023-01-03 DIAGNOSIS — Z931 Gastrostomy status: Secondary | ICD-10-CM | POA: Diagnosis not present

## 2023-01-03 DIAGNOSIS — F0154 Vascular dementia, unspecified severity, with anxiety: Secondary | ICD-10-CM | POA: Diagnosis not present

## 2023-01-03 DIAGNOSIS — E876 Hypokalemia: Secondary | ICD-10-CM | POA: Diagnosis not present

## 2023-01-03 DIAGNOSIS — J302 Other seasonal allergic rhinitis: Secondary | ICD-10-CM | POA: Diagnosis not present

## 2023-01-03 DIAGNOSIS — D631 Anemia in chronic kidney disease: Secondary | ICD-10-CM | POA: Diagnosis not present

## 2023-01-03 DIAGNOSIS — Z9981 Dependence on supplemental oxygen: Secondary | ICD-10-CM | POA: Diagnosis not present

## 2023-01-03 DIAGNOSIS — Z7982 Long term (current) use of aspirin: Secondary | ICD-10-CM | POA: Diagnosis not present

## 2023-01-03 DIAGNOSIS — Z466 Encounter for fitting and adjustment of urinary device: Secondary | ICD-10-CM | POA: Diagnosis not present

## 2023-01-03 DIAGNOSIS — I129 Hypertensive chronic kidney disease with stage 1 through stage 4 chronic kidney disease, or unspecified chronic kidney disease: Secondary | ICD-10-CM | POA: Diagnosis not present

## 2023-01-03 DIAGNOSIS — G4733 Obstructive sleep apnea (adult) (pediatric): Secondary | ICD-10-CM | POA: Diagnosis not present

## 2023-01-03 DIAGNOSIS — E785 Hyperlipidemia, unspecified: Secondary | ICD-10-CM | POA: Diagnosis not present

## 2023-01-03 DIAGNOSIS — M6282 Rhabdomyolysis: Secondary | ICD-10-CM | POA: Diagnosis not present

## 2023-01-03 DIAGNOSIS — N189 Chronic kidney disease, unspecified: Secondary | ICD-10-CM | POA: Diagnosis not present

## 2023-01-03 DIAGNOSIS — Z8669 Personal history of other diseases of the nervous system and sense organs: Secondary | ICD-10-CM | POA: Diagnosis not present

## 2023-01-03 DIAGNOSIS — F0153 Vascular dementia, unspecified severity, with mood disturbance: Secondary | ICD-10-CM | POA: Diagnosis not present

## 2023-01-03 DIAGNOSIS — R1310 Dysphagia, unspecified: Secondary | ICD-10-CM | POA: Diagnosis not present

## 2023-01-03 DIAGNOSIS — E46 Unspecified protein-calorie malnutrition: Secondary | ICD-10-CM | POA: Diagnosis not present

## 2023-01-03 DIAGNOSIS — G2581 Restless legs syndrome: Secondary | ICD-10-CM | POA: Diagnosis not present

## 2023-01-03 DIAGNOSIS — E1122 Type 2 diabetes mellitus with diabetic chronic kidney disease: Secondary | ICD-10-CM | POA: Diagnosis not present

## 2023-01-03 DIAGNOSIS — I951 Orthostatic hypotension: Secondary | ICD-10-CM | POA: Diagnosis not present

## 2023-01-03 NOTE — Progress Notes (Signed)
Looks like per drug registry, Lyrica # 180 refilled 12/11/22 for 90 day supply.

## 2023-01-07 ENCOUNTER — Ambulatory Visit: Payer: Self-pay

## 2023-01-07 NOTE — Patient Outreach (Signed)
  Care Coordination   Follow Up Visit Note   01/07/2023 Name: Jose Li MRN: [id number] DOB: [birth year]  Jose Li is a 67 y.o. year old male who sees Holley Bouche, MD for primary care. I spoke with  Jose Li by phone today.  What matters to the patients health and wellness today?  According to Mr. Stile, he is doing well and has not experienced any headaches, chest pain, or flushing. His blood pressure has ranged between 112/64 and 138/76. Although he has not had any falls in the past three weeks, he did have a couple of fainting spells. As per Mr. Vankleeck' request, I helped him schedule a check-up appointment with his provider on 01/24/23.    Goals Addressed             This Visit's Progress    To manage and monitor his low blood pressure and falls       Patient Goals/Self Care Activities: -Patient/Caregiver will self-administer medications as prescribed as evidenced by self-report/primary caregiver report  -Patient/Caregiver will attend all scheduled provider appointments as evidenced by clinician review of documented attendance to scheduled appointments and patient/caregiver report -Patient/Caregiver will call pharmacy for medication refills as evidenced by patient report and review of pharmacy fill history as appropriate -Patient/Caregiver will call provider office for new concerns or questions as evidenced by review of documented incoming telephone call notes and patient report -Patient/Caregiver verbalizes understanding of plan -Patient/Caregiver will focus on medication adherence by taking medications as prescribed  -Calls provider office for new concerns, questions, or BP outside discussed parameters -Checks BP and records as discussed -Follows a low sodium diet/DASH diet          SDOH assessments and interventions completed:  No     Care Coordination Interventions:  Yes, provided   Interventions Today    Flowsheet Row Most Recent Value  Chronic Disease    Chronic disease during today's visit Hypertension (HTN)  General Interventions   General Interventions Discussed/Reviewed General Interventions Discussed, General Interventions Reviewed  Nutrition Interventions   Nutrition Discussed/Reviewed Nutrition Discussed  Safety Interventions   Safety Discussed/Reviewed Safety Discussed        Follow up plan: Follow up call scheduled for 02/09/23 1130 am    Encounter Outcome:  Pt. Visit Completed   Lazaro Arms RN, BSN, [facility]   Phone: [phone]   .

## 2023-01-07 NOTE — Patient Instructions (Signed)
Visit Information  Thank you for taking time to visit with me today. Please don't hesitate to contact me if I can be of assistance to you.   Following are the goals we discussed today:   Goals Addressed             This Visit's Progress    To manage and monitor his low blood pressure and falls       Patient Goals/Self Care Activities: -Patient/Caregiver will self-administer medications as prescribed as evidenced by self-report/primary caregiver report  -Patient/Caregiver will attend all scheduled provider appointments as evidenced by clinician review of documented attendance to scheduled appointments and patient/caregiver report -Patient/Caregiver will call pharmacy for medication refills as evidenced by patient report and review of pharmacy fill history as appropriate -Patient/Caregiver will call provider office for new concerns or questions as evidenced by review of documented incoming telephone call notes and patient report -Patient/Caregiver verbalizes understanding of plan -Patient/Caregiver will focus on medication adherence by taking medications as prescribed  -Calls provider office for new concerns, questions, or BP outside discussed parameters -Checks BP and records as discussed -Follows a low sodium diet/DASH diet          Our next appointment is by telephone on 02/09/23 at 1130 am  Please call the care guide team at [phone] if you need to cancel or reschedule your appointment.   If you are experiencing a Mental Health or [place] or need someone to talk to, please call 1-800-273-TALK (toll free, 24 hour hotline)  Patient verbalizes understanding of instructions and care plan provided today and agrees to view in [place]. Active MyChart status and patient understanding of how to access instructions and care plan via MyChart confirmed with patient.     Lazaro Arms RN, BSN, [facility]   Phone: [phone]

## 2023-01-10 DIAGNOSIS — E1122 Type 2 diabetes mellitus with diabetic chronic kidney disease: Secondary | ICD-10-CM | POA: Diagnosis not present

## 2023-01-10 DIAGNOSIS — G2581 Restless legs syndrome: Secondary | ICD-10-CM | POA: Diagnosis not present

## 2023-01-10 DIAGNOSIS — G4733 Obstructive sleep apnea (adult) (pediatric): Secondary | ICD-10-CM | POA: Diagnosis not present

## 2023-01-10 DIAGNOSIS — E785 Hyperlipidemia, unspecified: Secondary | ICD-10-CM | POA: Diagnosis not present

## 2023-01-10 DIAGNOSIS — I129 Hypertensive chronic kidney disease with stage 1 through stage 4 chronic kidney disease, or unspecified chronic kidney disease: Secondary | ICD-10-CM | POA: Diagnosis not present

## 2023-01-10 DIAGNOSIS — N189 Chronic kidney disease, unspecified: Secondary | ICD-10-CM | POA: Diagnosis not present

## 2023-01-10 DIAGNOSIS — R1310 Dysphagia, unspecified: Secondary | ICD-10-CM | POA: Diagnosis not present

## 2023-01-10 DIAGNOSIS — F0153 Vascular dementia, unspecified severity, with mood disturbance: Secondary | ICD-10-CM | POA: Diagnosis not present

## 2023-01-10 DIAGNOSIS — Z9981 Dependence on supplemental oxygen: Secondary | ICD-10-CM | POA: Diagnosis not present

## 2023-01-10 DIAGNOSIS — Z7982 Long term (current) use of aspirin: Secondary | ICD-10-CM | POA: Diagnosis not present

## 2023-01-10 DIAGNOSIS — E46 Unspecified protein-calorie malnutrition: Secondary | ICD-10-CM | POA: Diagnosis not present

## 2023-01-10 DIAGNOSIS — I951 Orthostatic hypotension: Secondary | ICD-10-CM | POA: Diagnosis not present

## 2023-01-10 DIAGNOSIS — D631 Anemia in chronic kidney disease: Secondary | ICD-10-CM | POA: Diagnosis not present

## 2023-01-10 DIAGNOSIS — Z8669 Personal history of other diseases of the nervous system and sense organs: Secondary | ICD-10-CM | POA: Diagnosis not present

## 2023-01-10 DIAGNOSIS — J302 Other seasonal allergic rhinitis: Secondary | ICD-10-CM | POA: Diagnosis not present

## 2023-01-10 DIAGNOSIS — Z466 Encounter for fitting and adjustment of urinary device: Secondary | ICD-10-CM | POA: Diagnosis not present

## 2023-01-10 DIAGNOSIS — F0154 Vascular dementia, unspecified severity, with anxiety: Secondary | ICD-10-CM | POA: Diagnosis not present

## 2023-01-10 DIAGNOSIS — Z931 Gastrostomy status: Secondary | ICD-10-CM | POA: Diagnosis not present

## 2023-01-10 DIAGNOSIS — E876 Hypokalemia: Secondary | ICD-10-CM | POA: Diagnosis not present

## 2023-01-10 DIAGNOSIS — M6282 Rhabdomyolysis: Secondary | ICD-10-CM | POA: Diagnosis not present

## 2023-01-11 ENCOUNTER — Other Ambulatory Visit: Payer: Self-pay

## 2023-01-12 ENCOUNTER — Telehealth: Payer: Self-pay

## 2023-01-12 DIAGNOSIS — M6282 Rhabdomyolysis: Secondary | ICD-10-CM | POA: Diagnosis not present

## 2023-01-12 DIAGNOSIS — E785 Hyperlipidemia, unspecified: Secondary | ICD-10-CM | POA: Diagnosis not present

## 2023-01-12 DIAGNOSIS — R1310 Dysphagia, unspecified: Secondary | ICD-10-CM | POA: Diagnosis not present

## 2023-01-12 DIAGNOSIS — Z7982 Long term (current) use of aspirin: Secondary | ICD-10-CM | POA: Diagnosis not present

## 2023-01-12 DIAGNOSIS — G2581 Restless legs syndrome: Secondary | ICD-10-CM | POA: Diagnosis not present

## 2023-01-12 DIAGNOSIS — N189 Chronic kidney disease, unspecified: Secondary | ICD-10-CM | POA: Diagnosis not present

## 2023-01-12 DIAGNOSIS — Z8669 Personal history of other diseases of the nervous system and sense organs: Secondary | ICD-10-CM | POA: Diagnosis not present

## 2023-01-12 DIAGNOSIS — I129 Hypertensive chronic kidney disease with stage 1 through stage 4 chronic kidney disease, or unspecified chronic kidney disease: Secondary | ICD-10-CM | POA: Diagnosis not present

## 2023-01-12 DIAGNOSIS — G4733 Obstructive sleep apnea (adult) (pediatric): Secondary | ICD-10-CM | POA: Diagnosis not present

## 2023-01-12 DIAGNOSIS — E46 Unspecified protein-calorie malnutrition: Secondary | ICD-10-CM | POA: Diagnosis not present

## 2023-01-12 DIAGNOSIS — E1122 Type 2 diabetes mellitus with diabetic chronic kidney disease: Secondary | ICD-10-CM | POA: Diagnosis not present

## 2023-01-12 DIAGNOSIS — F0153 Vascular dementia, unspecified severity, with mood disturbance: Secondary | ICD-10-CM | POA: Diagnosis not present

## 2023-01-12 DIAGNOSIS — F0154 Vascular dementia, unspecified severity, with anxiety: Secondary | ICD-10-CM | POA: Diagnosis not present

## 2023-01-12 DIAGNOSIS — I951 Orthostatic hypotension: Secondary | ICD-10-CM | POA: Diagnosis not present

## 2023-01-12 DIAGNOSIS — Z931 Gastrostomy status: Secondary | ICD-10-CM | POA: Diagnosis not present

## 2023-01-12 DIAGNOSIS — E876 Hypokalemia: Secondary | ICD-10-CM | POA: Diagnosis not present

## 2023-01-12 DIAGNOSIS — D631 Anemia in chronic kidney disease: Secondary | ICD-10-CM | POA: Diagnosis not present

## 2023-01-12 DIAGNOSIS — Z9981 Dependence on supplemental oxygen: Secondary | ICD-10-CM | POA: Diagnosis not present

## 2023-01-12 DIAGNOSIS — J302 Other seasonal allergic rhinitis: Secondary | ICD-10-CM | POA: Diagnosis not present

## 2023-01-12 DIAGNOSIS — Z466 Encounter for fitting and adjustment of urinary device: Secondary | ICD-10-CM | POA: Diagnosis not present

## 2023-01-12 NOTE — Telephone Encounter (Signed)
Katherine from [employer] for PT verbal orders as follows:  1 time(s) weekly for 5 week(s) Verbal orders given per [facility] protocol  Talbot Grumbling, RN

## 2023-01-17 ENCOUNTER — Ambulatory Visit (INDEPENDENT_AMBULATORY_CARE_PROVIDER_SITE_OTHER): Payer: 59 | Admitting: Neurology

## 2023-01-17 DIAGNOSIS — I129 Hypertensive chronic kidney disease with stage 1 through stage 4 chronic kidney disease, or unspecified chronic kidney disease: Secondary | ICD-10-CM | POA: Diagnosis not present

## 2023-01-17 DIAGNOSIS — E876 Hypokalemia: Secondary | ICD-10-CM | POA: Diagnosis not present

## 2023-01-17 DIAGNOSIS — Z7982 Long term (current) use of aspirin: Secondary | ICD-10-CM | POA: Diagnosis not present

## 2023-01-17 DIAGNOSIS — G40909 Epilepsy, unspecified, not intractable, without status epilepticus: Secondary | ICD-10-CM | POA: Diagnosis not present

## 2023-01-17 DIAGNOSIS — G2581 Restless legs syndrome: Secondary | ICD-10-CM | POA: Diagnosis not present

## 2023-01-17 DIAGNOSIS — F0153 Vascular dementia, unspecified severity, with mood disturbance: Secondary | ICD-10-CM | POA: Diagnosis not present

## 2023-01-17 DIAGNOSIS — Z931 Gastrostomy status: Secondary | ICD-10-CM | POA: Diagnosis not present

## 2023-01-17 DIAGNOSIS — J302 Other seasonal allergic rhinitis: Secondary | ICD-10-CM | POA: Diagnosis not present

## 2023-01-17 DIAGNOSIS — E46 Unspecified protein-calorie malnutrition: Secondary | ICD-10-CM | POA: Diagnosis not present

## 2023-01-17 DIAGNOSIS — I951 Orthostatic hypotension: Secondary | ICD-10-CM | POA: Diagnosis not present

## 2023-01-17 DIAGNOSIS — F0154 Vascular dementia, unspecified severity, with anxiety: Secondary | ICD-10-CM | POA: Diagnosis not present

## 2023-01-17 DIAGNOSIS — E1122 Type 2 diabetes mellitus with diabetic chronic kidney disease: Secondary | ICD-10-CM | POA: Diagnosis not present

## 2023-01-17 DIAGNOSIS — Z466 Encounter for fitting and adjustment of urinary device: Secondary | ICD-10-CM | POA: Diagnosis not present

## 2023-01-17 DIAGNOSIS — Z8669 Personal history of other diseases of the nervous system and sense organs: Secondary | ICD-10-CM | POA: Diagnosis not present

## 2023-01-17 DIAGNOSIS — R1310 Dysphagia, unspecified: Secondary | ICD-10-CM | POA: Diagnosis not present

## 2023-01-17 DIAGNOSIS — Z9981 Dependence on supplemental oxygen: Secondary | ICD-10-CM | POA: Diagnosis not present

## 2023-01-17 DIAGNOSIS — N189 Chronic kidney disease, unspecified: Secondary | ICD-10-CM | POA: Diagnosis not present

## 2023-01-17 DIAGNOSIS — D631 Anemia in chronic kidney disease: Secondary | ICD-10-CM | POA: Diagnosis not present

## 2023-01-17 DIAGNOSIS — E785 Hyperlipidemia, unspecified: Secondary | ICD-10-CM | POA: Diagnosis not present

## 2023-01-17 DIAGNOSIS — G4733 Obstructive sleep apnea (adult) (pediatric): Secondary | ICD-10-CM | POA: Diagnosis not present

## 2023-01-17 DIAGNOSIS — M6282 Rhabdomyolysis: Secondary | ICD-10-CM | POA: Diagnosis not present

## 2023-01-17 NOTE — Procedures (Signed)
    History:  67 year old man with epilepsy   EEG classification: Awake and drowsy  Description of the recording: The background rhythms of this recording consists of a fairly well modulated medium amplitude alpha rhythm of 9 Hz that is reactive to eye opening and closure. Present in the anterior head region is a 15-20 Hz beta activity. Photic stimulation was performed, did not show any abnormalities. Hyperventilation was also performed, did not show any abnormalities. Drowsiness was manifested by background fragmentation. There was presence of frequent left temporal abnormal epileptiform discharges seen during this recording. There was left temporal focal slowing. There were no electrographic seizure identified.   Abnormality:  Frequent left temporal sharps  Left temporal slowing   Impression: This is an abnormal EEG recorded while drowsy and awake due to presence of left temporal sharps and focal slowing. This is consistent with an area of increase epileptogenic potential and neuronal dysfunction in the left temporal region.       Alric Ran, MD [facility]

## 2023-01-24 ENCOUNTER — Other Ambulatory Visit: Payer: Self-pay

## 2023-01-24 ENCOUNTER — Encounter: Payer: Self-pay | Admitting: Student

## 2023-01-24 ENCOUNTER — Ambulatory Visit (INDEPENDENT_AMBULATORY_CARE_PROVIDER_SITE_OTHER): Payer: 59 | Admitting: Student

## 2023-01-24 VITALS — BP 112/62 | HR 88 | Wt 172.0 lb

## 2023-01-24 DIAGNOSIS — N189 Chronic kidney disease, unspecified: Secondary | ICD-10-CM | POA: Diagnosis not present

## 2023-01-24 DIAGNOSIS — F0154 Vascular dementia, unspecified severity, with anxiety: Secondary | ICD-10-CM | POA: Diagnosis not present

## 2023-01-24 DIAGNOSIS — E785 Hyperlipidemia, unspecified: Secondary | ICD-10-CM | POA: Diagnosis not present

## 2023-01-24 DIAGNOSIS — E876 Hypokalemia: Secondary | ICD-10-CM | POA: Diagnosis not present

## 2023-01-24 DIAGNOSIS — D631 Anemia in chronic kidney disease: Secondary | ICD-10-CM | POA: Diagnosis not present

## 2023-01-24 DIAGNOSIS — Z7982 Long term (current) use of aspirin: Secondary | ICD-10-CM | POA: Diagnosis not present

## 2023-01-24 DIAGNOSIS — Z8669 Personal history of other diseases of the nervous system and sense organs: Secondary | ICD-10-CM | POA: Diagnosis not present

## 2023-01-24 DIAGNOSIS — G4733 Obstructive sleep apnea (adult) (pediatric): Secondary | ICD-10-CM | POA: Diagnosis not present

## 2023-01-24 DIAGNOSIS — E1122 Type 2 diabetes mellitus with diabetic chronic kidney disease: Secondary | ICD-10-CM | POA: Diagnosis not present

## 2023-01-24 DIAGNOSIS — R109 Unspecified abdominal pain: Secondary | ICD-10-CM | POA: Diagnosis not present

## 2023-01-24 DIAGNOSIS — R300 Dysuria: Secondary | ICD-10-CM

## 2023-01-24 DIAGNOSIS — I951 Orthostatic hypotension: Secondary | ICD-10-CM | POA: Diagnosis not present

## 2023-01-24 DIAGNOSIS — J302 Other seasonal allergic rhinitis: Secondary | ICD-10-CM | POA: Diagnosis not present

## 2023-01-24 DIAGNOSIS — Z9981 Dependence on supplemental oxygen: Secondary | ICD-10-CM | POA: Diagnosis not present

## 2023-01-24 DIAGNOSIS — F0153 Vascular dementia, unspecified severity, with mood disturbance: Secondary | ICD-10-CM | POA: Diagnosis not present

## 2023-01-24 DIAGNOSIS — Z931 Gastrostomy status: Secondary | ICD-10-CM | POA: Diagnosis not present

## 2023-01-24 DIAGNOSIS — I129 Hypertensive chronic kidney disease with stage 1 through stage 4 chronic kidney disease, or unspecified chronic kidney disease: Secondary | ICD-10-CM | POA: Diagnosis not present

## 2023-01-24 DIAGNOSIS — M6282 Rhabdomyolysis: Secondary | ICD-10-CM | POA: Diagnosis not present

## 2023-01-24 DIAGNOSIS — Z466 Encounter for fitting and adjustment of urinary device: Secondary | ICD-10-CM | POA: Diagnosis not present

## 2023-01-24 DIAGNOSIS — E46 Unspecified protein-calorie malnutrition: Secondary | ICD-10-CM | POA: Diagnosis not present

## 2023-01-24 DIAGNOSIS — G2581 Restless legs syndrome: Secondary | ICD-10-CM | POA: Diagnosis not present

## 2023-01-24 DIAGNOSIS — R1310 Dysphagia, unspecified: Secondary | ICD-10-CM | POA: Diagnosis not present

## 2023-01-24 NOTE — Progress Notes (Unsigned)
[facility] is requesting a refill of pregabalin for patient.

## 2023-01-24 NOTE — Patient Instructions (Addendum)
It was great to see you! Thank you for allowing me to participate in your care!  Our plans for today:  - Your abdominal pain could be coming from diverticulosis, than can cause some constipation/diarrhea. We'll have you increases the amount of fiber you take, by taking a fiber supplement. Look for Metamucil in the pharmacy    - For your urinary symptoms we are going to send you to Urology to be evaluated. We will also collect urine for retesting for UTI.   We are checking some labs today, I will call you if they are abnormal will send you a MyChart message or a letter if they are normal.  If you do not hear about your labs in the next 2 weeks please let us know.  Take care and seek immediate care sooner if you develop any concerns.   Dr. Holley Bouche, MD [place]

## 2023-01-24 NOTE — Progress Notes (Unsigned)
SUBJECTIVE:   CHIEF COMPLAINT / HPI:   Urination: Has been an issue for a couple of months. Has pain with urination 3-4 times a week, dull pain. Is not peeing to frequently but notices that sometimes he has to pee and it's only a little bit of urine. Pain with urination has been going on a month or two. Denies any fevers and is otherwise feeling well. Says he hasn't had sex in years. UA from 1/29 w/ > 30 RBC, 6-10 WBC,    Abdominal pain Has been an issue for last couple months. Located on side of abdomen (left), dull pain. Take's tylenol for the pain sometimes, and notes pain last a few hours at a time. Notes that bowel movements make the pain better. Also notes he get's constipation 1-2 times a week and get's diarrhea 3-4 times a week. Denies any blood or mucous in the stool. Notes that sometimes peptobismal helps with diarrhea. Notes that he will go from constipated to having diarrhea, and it is not food dependant.    PERTINENT  PMH / PSH:     Patient Care Team: Holley Bouche, MD as PCP - General (Family Medicine) Fay Records, MD as PCP - Cardiology (Cardiology) Suzzanne Cloud, NP as Nurse Practitioner (Neurology) Bethann Punches, MD as Referring Physician (Urology) Lazaro Arms, RN as [facility] OBJECTIVE:  BP 112/62   Pulse 88   Wt 172 lb (78 kg)   SpO2 98%   BMI 23.99 kg/m  Physical Exam Exam conducted with a chaperone present.  Constitutional:      General: He is not in acute distress.    Appearance: Normal appearance. He is not ill-appearing.  Abdominal:     General: Bowel sounds are normal. There is no distension.     Palpations: Abdomen is soft.     Tenderness: There is abdominal tenderness (TTP in epigastric and lateral abdomen).  Genitourinary:    Pubic Area: No rash.      Penis: Normal and circumcised. No erythema, tenderness, discharge, swelling or lesions.      Testes: Normal.        Right: Mass or tenderness not present.         Left: Mass or tenderness not present.     Epididymis:     Right: Not inflamed or enlarged.     Left: Not inflamed or enlarged.     Prostate: Normal. Not tender.     Rectum: Normal. No tenderness.  Neurological:     Mental Status: He is alert.      ASSESSMENT/PLAN:  Dysuria Assessment & Plan: Patient notes intermittent dysuria 3-4 times a week, pain is dull and only happens w/ urination. He denies frequent urination but notes sometimes it's only a small void, denies any blood in urine, fever or systemic symptoms and otherwise has been in usual state of health. Also notes that he had a UA done by cardiology that showed blood and they placed a referral to nephrology. Given his symptoms and confirmed abnormal UA, will repeat today and place referral to Urology. Patient could be suffering from UTI given symptoms, also concerned for epididymitis or prostatitis given age and hx, both exam's were normal, low concern. Given presence of hematuria, most concerned for malignant process and will send to Urology. Patient weight has remained stable.  -UA & Urine Culture -Ambulatory referral to Urology  Orders: -     Urinalysis -     Urine Culture -  Ambulatory referral to Urology  Abdominal pain, unspecified abdominal location Assessment & Plan: Patient notes abdominal pain that has been present for last couple of months and is associated with constipation/diarrhea. Pain is dull ache that last a few hours at a time. Nothing seems to make the abdominal pain better/worse and it's not brought on by eating. Patient notes he has constipation 1-2 times a week, followed by diarrhea 3-4 times a week. Patient denies any blood in stool, or systemic symptoms.  Patient notes that sometimes peptobismal helps w/ diarrhea. Patient had colonoscopy that was normal and showed diverticulosis in sigmoid colon. Abdominal pain and constipation/diarrhea symptoms could be coming from diverticulosis. Will recommend increasing  fiber. Also considered IBS but less likely given age of patient, low concern. Also considered  IBD, but less likely given lack of blood/mucous in stools, and relief w/ peptobismal.  -Increase fiber (metamucil) daily    No follow-ups on file. Holley Bouche, MD 01/25/2023, 8:43 AM PGY-2, [place]

## 2023-01-25 DIAGNOSIS — R300 Dysuria: Secondary | ICD-10-CM | POA: Insufficient documentation

## 2023-01-25 LAB — URINALYSIS
Bilirubin, UA: NEGATIVE
Glucose, UA: NEGATIVE
Ketones, UA: NEGATIVE
Leukocytes,UA: NEGATIVE
Nitrite, UA: NEGATIVE
Protein,UA: NEGATIVE
RBC, UA: NEGATIVE
Specific Gravity, UA: 1.023 (ref 1.005–1.030)
Urobilinogen, Ur: 0.2 mg/dL (ref 0.2–1.0)
pH, UA: 6.5 (ref 5.0–7.5)

## 2023-01-25 MED ORDER — PREGABALIN 75 MG PO CAPS: 75.0000 mg | ORAL_CAPSULE | Freq: Two times a day (BID) | ORAL | 1 refills | Status: DC

## 2023-01-25 NOTE — Assessment & Plan Note (Addendum)
Patient notes intermittent dysuria 3-4 times a week, pain is dull and only happens w/ urination. He denies frequent urination but notes sometimes it's only a small void, denies any blood in urine, fever or systemic symptoms and otherwise has been in usual state of health. Also notes that he had a UA done by cardiology that showed blood and they placed a referral to nephrology. Given his symptoms and confirmed abnormal UA, will repeat today and place referral to Urology. Patient could be suffering from UTI given symptoms, also concerned for epididymitis or prostatitis given age and hx, both exam's were normal, low concern. Given presence of hematuria, most concerned for malignant process and will send to Urology. Patient weight has remained stable.  -UA & Urine Culture -Ambulatory referral to Urology

## 2023-01-25 NOTE — Assessment & Plan Note (Signed)
Patient notes abdominal pain that has been present for last couple of months and is associated with constipation/diarrhea. Pain is dull ache that last a few hours at a time. Nothing seems to make the abdominal pain better/worse and it's not brought on by eating. Patient notes he has constipation 1-2 times a week, followed by diarrhea 3-4 times a week. Patient denies any blood in stool, or systemic symptoms.  Patient notes that sometimes peptobismal helps w/ diarrhea. Patient had colonoscopy that was normal and showed diverticulosis in sigmoid colon. Abdominal pain and constipation/diarrhea symptoms could be coming from diverticulosis. Will recommend increasing fiber. Also considered IBS but less likely given age of patient, low concern. Also considered  IBD, but less likely given lack of blood/mucous in stools, and relief w/ peptobismal.  -Increase fiber (metamucil) daily

## 2023-01-26 LAB — URINE CULTURE

## 2023-01-31 DIAGNOSIS — G4733 Obstructive sleep apnea (adult) (pediatric): Secondary | ICD-10-CM | POA: Diagnosis not present

## 2023-01-31 DIAGNOSIS — Z8669 Personal history of other diseases of the nervous system and sense organs: Secondary | ICD-10-CM | POA: Diagnosis not present

## 2023-01-31 DIAGNOSIS — F0153 Vascular dementia, unspecified severity, with mood disturbance: Secondary | ICD-10-CM | POA: Diagnosis not present

## 2023-01-31 DIAGNOSIS — Z9981 Dependence on supplemental oxygen: Secondary | ICD-10-CM | POA: Diagnosis not present

## 2023-01-31 DIAGNOSIS — E876 Hypokalemia: Secondary | ICD-10-CM | POA: Diagnosis not present

## 2023-01-31 DIAGNOSIS — E46 Unspecified protein-calorie malnutrition: Secondary | ICD-10-CM | POA: Diagnosis not present

## 2023-01-31 DIAGNOSIS — Z466 Encounter for fitting and adjustment of urinary device: Secondary | ICD-10-CM | POA: Diagnosis not present

## 2023-01-31 DIAGNOSIS — E785 Hyperlipidemia, unspecified: Secondary | ICD-10-CM | POA: Diagnosis not present

## 2023-01-31 DIAGNOSIS — I129 Hypertensive chronic kidney disease with stage 1 through stage 4 chronic kidney disease, or unspecified chronic kidney disease: Secondary | ICD-10-CM | POA: Diagnosis not present

## 2023-01-31 DIAGNOSIS — N189 Chronic kidney disease, unspecified: Secondary | ICD-10-CM | POA: Diagnosis not present

## 2023-01-31 DIAGNOSIS — R1310 Dysphagia, unspecified: Secondary | ICD-10-CM | POA: Diagnosis not present

## 2023-01-31 DIAGNOSIS — D631 Anemia in chronic kidney disease: Secondary | ICD-10-CM | POA: Diagnosis not present

## 2023-01-31 DIAGNOSIS — M6282 Rhabdomyolysis: Secondary | ICD-10-CM | POA: Diagnosis not present

## 2023-01-31 DIAGNOSIS — Z7982 Long term (current) use of aspirin: Secondary | ICD-10-CM | POA: Diagnosis not present

## 2023-01-31 DIAGNOSIS — E1122 Type 2 diabetes mellitus with diabetic chronic kidney disease: Secondary | ICD-10-CM | POA: Diagnosis not present

## 2023-01-31 DIAGNOSIS — G2581 Restless legs syndrome: Secondary | ICD-10-CM | POA: Diagnosis not present

## 2023-01-31 DIAGNOSIS — I951 Orthostatic hypotension: Secondary | ICD-10-CM | POA: Diagnosis not present

## 2023-01-31 DIAGNOSIS — J302 Other seasonal allergic rhinitis: Secondary | ICD-10-CM | POA: Diagnosis not present

## 2023-01-31 DIAGNOSIS — Z931 Gastrostomy status: Secondary | ICD-10-CM | POA: Diagnosis not present

## 2023-01-31 DIAGNOSIS — F0154 Vascular dementia, unspecified severity, with anxiety: Secondary | ICD-10-CM | POA: Diagnosis not present

## 2023-02-07 DIAGNOSIS — I129 Hypertensive chronic kidney disease with stage 1 through stage 4 chronic kidney disease, or unspecified chronic kidney disease: Secondary | ICD-10-CM | POA: Diagnosis not present

## 2023-02-07 DIAGNOSIS — E1122 Type 2 diabetes mellitus with diabetic chronic kidney disease: Secondary | ICD-10-CM | POA: Diagnosis not present

## 2023-02-07 DIAGNOSIS — Z7982 Long term (current) use of aspirin: Secondary | ICD-10-CM | POA: Diagnosis not present

## 2023-02-07 DIAGNOSIS — D631 Anemia in chronic kidney disease: Secondary | ICD-10-CM | POA: Diagnosis not present

## 2023-02-07 DIAGNOSIS — N189 Chronic kidney disease, unspecified: Secondary | ICD-10-CM | POA: Diagnosis not present

## 2023-02-07 DIAGNOSIS — R1310 Dysphagia, unspecified: Secondary | ICD-10-CM | POA: Diagnosis not present

## 2023-02-07 DIAGNOSIS — G4733 Obstructive sleep apnea (adult) (pediatric): Secondary | ICD-10-CM | POA: Diagnosis not present

## 2023-02-07 DIAGNOSIS — F0153 Vascular dementia, unspecified severity, with mood disturbance: Secondary | ICD-10-CM | POA: Diagnosis not present

## 2023-02-07 DIAGNOSIS — Z931 Gastrostomy status: Secondary | ICD-10-CM | POA: Diagnosis not present

## 2023-02-07 DIAGNOSIS — J302 Other seasonal allergic rhinitis: Secondary | ICD-10-CM | POA: Diagnosis not present

## 2023-02-07 DIAGNOSIS — E46 Unspecified protein-calorie malnutrition: Secondary | ICD-10-CM | POA: Diagnosis not present

## 2023-02-07 DIAGNOSIS — Z466 Encounter for fitting and adjustment of urinary device: Secondary | ICD-10-CM | POA: Diagnosis not present

## 2023-02-07 DIAGNOSIS — M6282 Rhabdomyolysis: Secondary | ICD-10-CM | POA: Diagnosis not present

## 2023-02-07 DIAGNOSIS — Z9981 Dependence on supplemental oxygen: Secondary | ICD-10-CM | POA: Diagnosis not present

## 2023-02-07 DIAGNOSIS — Z8669 Personal history of other diseases of the nervous system and sense organs: Secondary | ICD-10-CM | POA: Diagnosis not present

## 2023-02-07 DIAGNOSIS — F0154 Vascular dementia, unspecified severity, with anxiety: Secondary | ICD-10-CM | POA: Diagnosis not present

## 2023-02-07 DIAGNOSIS — E785 Hyperlipidemia, unspecified: Secondary | ICD-10-CM | POA: Diagnosis not present

## 2023-02-07 DIAGNOSIS — G2581 Restless legs syndrome: Secondary | ICD-10-CM | POA: Diagnosis not present

## 2023-02-07 DIAGNOSIS — E876 Hypokalemia: Secondary | ICD-10-CM | POA: Diagnosis not present

## 2023-02-07 DIAGNOSIS — I951 Orthostatic hypotension: Secondary | ICD-10-CM | POA: Diagnosis not present

## 2023-02-08 ENCOUNTER — Telehealth (HOSPITAL_BASED_OUTPATIENT_CLINIC_OR_DEPARTMENT_OTHER): Payer: Self-pay

## 2023-02-08 ENCOUNTER — Telehealth: Payer: Self-pay

## 2023-02-08 ENCOUNTER — Ambulatory Visit (INDEPENDENT_AMBULATORY_CARE_PROVIDER_SITE_OTHER): Payer: 59 | Admitting: Urology

## 2023-02-08 ENCOUNTER — Telehealth: Payer: Self-pay | Admitting: Urology

## 2023-02-08 ENCOUNTER — Encounter: Payer: Self-pay | Admitting: Urology

## 2023-02-08 VITALS — BP 103/69 | HR 94 | Ht 71.0 in | Wt 165.0 lb

## 2023-02-08 DIAGNOSIS — N401 Enlarged prostate with lower urinary tract symptoms: Secondary | ICD-10-CM | POA: Diagnosis not present

## 2023-02-08 DIAGNOSIS — R300 Dysuria: Secondary | ICD-10-CM | POA: Diagnosis not present

## 2023-02-08 DIAGNOSIS — R3129 Other microscopic hematuria: Secondary | ICD-10-CM | POA: Diagnosis not present

## 2023-02-08 LAB — MICROSCOPIC EXAMINATION
Crystal Type: NONE SEEN
Crystals: NONE SEEN
Trichomonas, UA: NONE SEEN
Yeast, UA: NONE SEEN

## 2023-02-08 LAB — URINALYSIS, ROUTINE W REFLEX MICROSCOPIC
Bilirubin, UA: NEGATIVE
Glucose, UA: NEGATIVE
Ketones, UA: NEGATIVE
Nitrite, UA: NEGATIVE
Protein,UA: NEGATIVE
RBC, UA: NEGATIVE
Specific Gravity, UA: 1.025 (ref 1.005–1.030)
Urobilinogen, Ur: 0.2 mg/dL (ref 0.2–1.0)
pH, UA: 5.5 (ref 5.0–7.5)

## 2023-02-08 NOTE — Progress Notes (Signed)
Assessment: 1. Benign localized prostatic hyperplasia with lower urinary tract symptoms (LUTS)   2. Microscopic hematuria      Plan: Today I discussed with the patient recommendations for hematuria evaluation including upper tract imaging and cystoscopy.   BMP and PSA today The patient did have a contrasted CT 07/2022 and given this we will just obtain a CT stone study and avoid the contrast.  The patient will follow-up following CT scan for cystoscopy. PVR and DRE on follow-up  Chief Complaint:  Chief Complaint  Patient presents with   Dysuria    History of Present Illness:  Jose Li is a 67 y.o. male who is seen in consultation from Holley Bouche, MD for evaluation of LUTS. Patient has a complex past medical history including syncope, autonomic dysfunction, bipolar disorder OSA (on BiPAP), seizures due to EtOH withdrawal., HL, tobacco use, EtOH abuse, HTN, BPH and hypokalemia.  Patient has a long history of lower urinary tract symptoms and was previously started on tamsulosin several years ago.  The patient actually reports that his symptoms are stable however over the past few months he has had occasional dysuria which she states is just a mild burning when he urinates.  The patient also has become aware that it typically occurs when he is not drinking much water and his urine is quite concentrated. Current IPSS = 18  Patient also reports that he has seen some blood in his urine previously although he states it has been several months.  He did have significant microscopic hematuria on urinalysis in January along with some pyuria as well.  Culture was negative.  Past Medical History:  Past Medical History:  Diagnosis Date   Acute kidney injury ([place]) 05/26/2018   Allergy    Anemia    Anxiety    Arthritis    LEGS,BACK   Cataract    BILATERAL   Depression    Hyperlipidemia    Hypertension, essential, benign 08/02/2012   Orthostatic hypotension 10/09/2013   OSA  (obstructive sleep apnea) 10/01/2015   Severe OSA with an AHI of 86/hr now on BiPAP at 23/19cm H2O   Rhabdomyolysis 08/24/2022   Seizures ([place])    none for last 6-61months   Sleep apnea    Substance abuse ([place])     Past Surgical History:  Past Surgical History:  Procedure Laterality Date   COLONOSCOPY     CRANIOTOMY Left 06/08/2022   Procedure: CRANIOTOMY HEMATOMA EVACUATION SUBDURAL;  Surgeon: Earnie Larsson, MD;  Location: [facility];  Service: Neurosurgery;  Laterality: Left;   FRACTURE SURGERY  1990   right hip and femur   HERNIA REPAIR  [id number]   umbilical hernia/ got infected   JOINT REPLACEMENT Right 1990   right hip   TRACHEOSTOMY  08/1999   Had pneumoniaand was in a coma 10 days    Allergies:  Allergies  Allergen Reactions   Cymbalta [Duloxetine Hcl] Other (See Comments)    Hyponatremia    Erythromycin Other (See Comments)    Unknown reation   Hctz [Hydrochlorothiazide] Other (See Comments)    Syncope   Inderal [Propranolol] Other (See Comments)    Syncope     Family History:  Family History  Problem Relation Age of Onset   Heart disease Mother    Diabetes Father    Heart disease Brother    Hypertension Neg Hx    Stroke Neg Hx    Colon cancer Neg Hx    Colon polyps Neg Hx  Crohn's disease Neg Hx    Esophageal cancer Neg Hx    Rectal cancer Neg Hx    Stomach cancer Neg Hx    Ulcerative colitis Neg Hx     Social History:  Social History   Tobacco Use   Smoking status: Every Day    Packs/day: 1.00    Years: 30.00    Additional pack years: 0.00    Total pack years: 30.00    Types: Cigarettes    Passive exposure: Current   Smokeless tobacco: Never  Vaping Use   Vaping Use: Never used  Substance Use Topics   Alcohol use: No    Comment: history of alcohol abuse.   Drug use: Not Currently    Comment: Hx of cocaine use "years ago"    Review of symptoms:  Constitutional:  Negative for unexplained weight loss, night sweats, fever, chills ENT:   Negative for nose bleeds, sinus pain, painful swallowing CV:  Negative for chest pain, shortness of breath, exercise intolerance, palpitations, loss of consciousness Resp:  Negative for cough, wheezing, shortness of breath GI:  Negative for nausea, vomiting, diarrhea, bloody stools GU:  Positives noted in HPI; otherwise negative for gross hematuria, dysuria, urinary incontinence Neuro:  Positivefor seizures, poor balance, limb weakness, slurred speech Psych:  Negative for lack of energy, depression, anxiety Endocrine:  Negative for polydipsia, polyuria, symptoms of hypoglycemia (dizziness, hunger, sweating) Hematologic:  Negative for anemia, purpura, petechia, prolonged or excessive bleeding, use of anticoagulants  Allergic:  Negative for difficulty breathing or choking as a result of exposure to anything; no shellfish allergy; no allergic response (rash/itch) to materials, foods  Physical exam: BP 103/69   Pulse 94   Ht 5\' 11"  (1.803 m)   Wt 165 lb (74.8 kg)   BMI 23.01 kg/m  GENERAL APPEARANCE:  Well appearing, well developed, well nourished, NAD

## 2023-02-08 NOTE — Telephone Encounter (Signed)
Patient called multiple times after his appt to ask that his follow up appt be in [place] as he does not drive and he cannot find transportation. He asked for the imaging and the cysto by in [place]. Told him that we do not have an office in [place] and that we would have to refer him to a different office for this.

## 2023-02-08 NOTE — Telephone Encounter (Signed)
Patient called in regards to a CT Scan and wanted a call back from [facility] [place] to discuss this.  Patients callback #: [phone]

## 2023-02-09 ENCOUNTER — Ambulatory Visit: Payer: Self-pay

## 2023-02-09 NOTE — Patient Outreach (Unsigned)
  Care Coordination   Follow Up Visit Note   02/10/2023 Name: Jose Li MRN: [id number] DOB: April 18, 1956  Jose Li is a 67 y.o. year old male who sees Holley Bouche, MD for primary care. I spoke with  Jose Li by phone today.  What matters to the patients health and wellness today?  Mr. Jose Li is doing well. He was released from physical therapy and feels stronger. Although he couldn't provide his blood pressure readings at the moment, they have been consistently good. He shared that he has been feeling lonely and would appreciate someone to talk to. I suggested that I could refer him to my co-worker, Eduard Clos SW, who could give him a call to chat with some suggestion. He agreed with my suggestion.       Goals Addressed             This Visit's Progress    To manage and monitor his low blood pressure and falls       Patient Goals/Self Care Activities: -Patient/Caregiver will self-administer medications as prescribed as evidenced by self-report/primary caregiver report  -Patient/Caregiver will attend all scheduled provider appointments as evidenced by clinician review of documented attendance to scheduled appointments and patient/caregiver report -Patient/Caregiver will call pharmacy for medication refills as evidenced by patient report and review of pharmacy fill history as appropriate -Patient/Caregiver will call provider office for new concerns or questions as evidenced by review of documented incoming telephone call notes and patient report -Patient/Caregiver verbalizes understanding of plan -Patient/Caregiver will focus on medication adherence by taking medications as prescribed  -Calls provider office for new concerns, questions, or BP outside discussed parameters -Checks BP and records as discussed -Follows a low sodium diet/DASH diet  -continue to stand still to get his bearing before walking and use a cane if needed         SDOH assessments and  interventions completed:  No     Care Coordination Interventions:  Yes, provided   Interventions Today    Flowsheet Row Most Recent Value  Chronic Disease   Chronic disease during today's visit Hypertension (HTN), Other  [FAll Precautions]  General Interventions   General Interventions Discussed/Reviewed General Interventions Discussed, General Interventions Reviewed  Education Interventions   Education Provided Provided Education  Safety Interventions   Safety Discussed/Reviewed Safety Discussed, Safety Reviewed        Follow up plan: Follow up call scheduled for 03/10/23  11 am    Encounter Outcome:  Pt. Visit Completed   Lazaro Arms RN, BSN, [facility]   Phone: [phone]

## 2023-02-10 NOTE — Patient Outreach (Deleted)
  Care Coordination   Follow Up Visit Note   02/10/2023 Name: DALMER DOLINSKI MRN: [id number] DOB: 1956/01/01  [name] is a 67 y.o. year old male who sees Holley Bouche, MD for primary care. I spoke with  [name] by phone today.  What matters to the patients health and wellness today?  ***    Goals Addressed             This Visit's Progress    To manage and monitor his low blood pressure and falls       Patient Goals/Self Care Activities: -Patient/Caregiver will self-administer medications as prescribed as evidenced by self-report/primary caregiver report  -Patient/Caregiver will attend all scheduled provider appointments as evidenced by clinician review of documented attendance to scheduled appointments and patient/caregiver report -Patient/Caregiver will call pharmacy for medication refills as evidenced by patient report and review of pharmacy fill history as appropriate -Patient/Caregiver will call provider office for new concerns or questions as evidenced by review of documented incoming telephone call notes and patient report -Patient/Caregiver verbalizes understanding of plan -Patient/Caregiver will focus on medication adherence by taking medications as prescribed  -Calls provider office for new concerns, questions, or BP outside discussed parameters -Checks BP and records as discussed -Follows a low sodium diet/DASH diet  -continue to stand still to get his bearing before walking and use a cane if needed         SDOH assessments and interventions completed:  {yes/no:20286}{THN Tip this will not be part of the note when signed-REQUIRED REPORT FIELD DO NOT DELETE (Optional):27901}     Care Coordination Interventions:  {INTERVENTIONS:27767} {THN Tip this will not be part of the note when signed-REQUIRED REPORT FIELD DO NOT DELETE (Optional):27901}  Follow up plan: {CCFOLLOWUP:27768}   Encounter Outcome:  {ENCOUTCOME:27770} {THN Tip this will not be part of the  note when signed-REQUIRED REPORT FIELD DO NOT DELETE (Optional):27901}

## 2023-02-10 NOTE — Telephone Encounter (Signed)
Patient given the number to [facility] so he can schedule his CT at his convenience.

## 2023-02-10 NOTE — Patient Instructions (Signed)
Visit Information  Thank you for taking time to visit with me today. Please don't hesitate to contact me if I can be of assistance to you.   Following are the goals we discussed today:   Goals Addressed             This Visit's Progress    To manage and monitor his low blood pressure and falls       Patient Goals/Self Care Activities: -Patient/Caregiver will self-administer medications as prescribed as evidenced by self-report/primary caregiver report  -Patient/Caregiver will attend all scheduled provider appointments as evidenced by clinician review of documented attendance to scheduled appointments and patient/caregiver report -Patient/Caregiver will call pharmacy for medication refills as evidenced by patient report and review of pharmacy fill history as appropriate -Patient/Caregiver will call provider office for new concerns or questions as evidenced by review of documented incoming telephone call notes and patient report -Patient/Caregiver verbalizes understanding of plan -Patient/Caregiver will focus on medication adherence by taking medications as prescribed  -Calls provider office for new concerns, questions, or BP outside discussed parameters -Checks BP and records as discussed -Follows a low sodium diet/DASH diet  -continue to stand still to get his bearing before walking and use a cane if needed         Our next appointment is by telephone on 03/10/23 at 11 am  Please call the care guide team at [phone] if you need to cancel or reschedule your appointment.   If you are experiencing a Mental Health or [place] or need someone to talk to, please call 1-800-273-TALK (toll free, 24 hour hotline)  Patient verbalizes understanding of instructions and care plan provided today and agrees to view in [place]. Active MyChart status and patient understanding of how to access instructions and care plan via MyChart confirmed with patient.     Lazaro Arms RN,  BSN, [facility]   Phone: [phone]

## 2023-02-12 ENCOUNTER — Other Ambulatory Visit: Payer: Self-pay | Admitting: Internal Medicine

## 2023-02-14 ENCOUNTER — Other Ambulatory Visit: Payer: Self-pay

## 2023-02-14 MED ORDER — ATORVASTATIN CALCIUM 20 MG PO TABS: 20.0000 mg | ORAL_TABLET | Freq: Every day | ORAL | 3 refills | Status: DC

## 2023-02-15 ENCOUNTER — Ambulatory Visit: Payer: Self-pay | Admitting: *Deleted

## 2023-02-15 ENCOUNTER — Encounter: Payer: Self-pay | Admitting: *Deleted

## 2023-02-15 DIAGNOSIS — R3129 Other microscopic hematuria: Secondary | ICD-10-CM | POA: Diagnosis not present

## 2023-02-15 NOTE — Patient Instructions (Signed)
Visit Information  Thank you for taking time to visit with me today. Please don't hesitate to contact me if I can be of assistance to you.   Following are the goals we discussed today:   Goals Addressed             This Visit's Progress    Seek opportunities to enhance quality of life       Care Coordination Interventions: Assessed Social Determinants of Health Depression screen reviewed  PHQ2/ PHQ9 completed Solution-Focused Strategies employed:  Active listening / Reflection utilized  Problem [place] strategies reviewed Provided general psycho-education for mental health needs  Reviewed mental health medications and discussed importance of compliance:   Participation in counseling encouraged : pt declines at this time Participation in support group encouraged : declines at this time Consider volunteer opportunities   Transportation provided by insurance provider ( )  [employer] transportation (will mail you an application to complete your portion and to take to PCP to complete MD part and fax for you) Solution-Focused Strategies employed:  Problem [place] strategies reviewed Continue to remain alcohol free- great job         Our next appointment is by telephone on 03/07/23   Please call the care guide team at [phone] if you need to cancel or reschedule your appointment.   If you are experiencing a Mental Health or [place] or need someone to talk to, please call the Suicide and Crisis Lifeline: 988 call 911   The patient verbalized understanding of instructions, educational materials, and care plan provided today and DECLINED offer to receive copy of patient instructions, educational materials, and care plan.   Telephone follow up appointment with care management team member scheduled for: 03/07/23  .jcs

## 2023-02-15 NOTE — Patient Outreach (Addendum)
  Care Coordination   Initial Visit Note   02/15/2023 Name: Jose Li MRN: [id number] DOB: July 18, 1956  Jose Li is a 67 y.o. year old male who sees Holley Bouche, MD for primary care. I spoke with  Jose Li by phone today.  What matters to the patients health and wellness today?  Lonely; wants to get connected.    Goals Addressed             This Visit's Progress    Seek opportunities to enhance quality of life       Care Coordination Interventions: Assessed Social Determinants of Health Depression screen reviewed  PHQ2/ PHQ9 completed Solution-Focused Strategies employed:  Active listening / Reflection utilized  Problem [place] strategies reviewed Provided general psycho-education for mental health needs  Reviewed mental health medications and discussed importance of compliance:   Participation in counseling encouraged : pt declines at this time Participation in support group encouraged : declines at this time Consider volunteer opportunities   Transportation provided by insurance provider ( )  [employer] transportation (will mail you an application to complete your portion and to take to PCP to complete MD part and fax for you) Solution-Focused Strategies employed:  Problem [place] strategies reviewed Continue to remain alcohol free- great job         SDOH assessments and interventions completed:  Yes  SDOH Interventions Today    Flowsheet Row Most Recent Value  SDOH Interventions   Food Insecurity Interventions Intervention Not Indicated  Housing Interventions Intervention Not Indicated  Transportation Interventions [employer] ([employer])  Utilities Interventions Intervention Not Indicated  Alcohol Usage Interventions Intervention Not Indicated (Score <7)  [pt reports sober x 5 months]  Depression Interventions/Treatment  Currently on Treatment, Medication  [[facility] patient]  Financial Strain Interventions  Intervention Not Indicated        Care Coordination Interventions:  Yes, provided  Interventions Today    Flowsheet Row Most Recent Value  Chronic Disease   Chronic disease during today's visit Hypertension (HTN)  General Interventions   General Interventions Discussed/Reviewed General Interventions Discussed, General Interventions Reviewed, Community Resources  Mental Health Interventions   Mental Health Discussed/Reviewed Substance Abuse  [Pt reports bipolar d/o depression. History of alcohol abuse- sober x 43months after rehab stay]  Nutrition Interventions   Nutrition Discussed/Reviewed Nutrition Discussed  [Receives $22 food stamps/UCARD $326]  Pharmacy Interventions   Pharmacy Dicussed/Reviewed Pharmacy Topics Discussed, Medication Adherence  Advanced Directive Interventions   Advanced Directives Discussed/Reviewed Advanced Directives Discussed  [Pt thinks his HCPOA is complete and friend has copy- [place]]       Follow up plan: Follow up call scheduled for 03/07/23    Encounter Outcome:  Pt. Visit Completed

## 2023-02-16 LAB — BASIC METABOLIC PANEL
BUN/Creatinine Ratio: 9 — ABNORMAL LOW (ref 10–24)
BUN: 11 mg/dL (ref 8–27)
CO2: 23 mmol/L (ref 20–29)
Calcium: 9.5 mg/dL (ref 8.6–10.2)
Chloride: 107 mmol/L — ABNORMAL HIGH (ref 96–106)
Creatinine, Ser: 1.24 mg/dL (ref 0.76–1.27)
Glucose: 84 mg/dL (ref 70–99)
Potassium: 5.4 mmol/L — ABNORMAL HIGH (ref 3.5–5.2)
Sodium: 142 mmol/L (ref 134–144)
eGFR: 65 mL/min/{1.73_m2} (ref >59)

## 2023-02-16 LAB — PSA: Prostate Specific Ag, Serum: 0.6 ng/mL (ref 0.0–4.0)

## 2023-02-22 ENCOUNTER — Other Ambulatory Visit: Payer: 59 | Admitting: Urology

## 2023-03-01 DIAGNOSIS — I129 Hypertensive chronic kidney disease with stage 1 through stage 4 chronic kidney disease, or unspecified chronic kidney disease: Secondary | ICD-10-CM | POA: Diagnosis not present

## 2023-03-01 DIAGNOSIS — N281 Cyst of kidney, acquired: Secondary | ICD-10-CM | POA: Diagnosis not present

## 2023-03-01 DIAGNOSIS — N1831 Chronic kidney disease, stage 3a: Secondary | ICD-10-CM | POA: Diagnosis not present

## 2023-03-01 DIAGNOSIS — G909 Disorder of the autonomic nervous system, unspecified: Secondary | ICD-10-CM | POA: Diagnosis not present

## 2023-03-01 DIAGNOSIS — E876 Hypokalemia: Secondary | ICD-10-CM | POA: Diagnosis not present

## 2023-03-10 ENCOUNTER — Ambulatory Visit
Admission: RE | Admit: 2023-03-10 | Discharge: 2023-03-10 | Disposition: A | Discharge status: Home / Self Care | Payer: 59 | Source: Ambulatory Visit | Attending: Urology | Admitting: Urology

## 2023-03-10 ENCOUNTER — Ambulatory Visit: Payer: Self-pay

## 2023-03-10 DIAGNOSIS — R300 Dysuria: Secondary | ICD-10-CM | POA: Diagnosis not present

## 2023-03-10 DIAGNOSIS — R109 Unspecified abdominal pain: Secondary | ICD-10-CM | POA: Diagnosis not present

## 2023-03-10 DIAGNOSIS — N401 Enlarged prostate with lower urinary tract symptoms: Secondary | ICD-10-CM

## 2023-03-11 ENCOUNTER — Ambulatory Visit: Payer: Self-pay | Admitting: *Deleted

## 2023-03-11 NOTE — Patient Instructions (Signed)
Visit Information  Thank you for taking time to visit with me today. Please don't hesitate to contact me if I can be of assistance to you.   Following are the goals we discussed today:   Goals Addressed             This Visit's Progress    To manage and monitor his low blood pressure and falls       Patient Goals/Self Care Activities: -Patient/Caregiver will self-administer medications as prescribed as evidenced by self-report/primary caregiver report  -Patient/Caregiver will attend all scheduled provider appointments as evidenced by clinician review of documented attendance to scheduled appointments and patient/caregiver report -Patient/Caregiver will call pharmacy for medication refills as evidenced by patient report and review of pharmacy fill history as appropriate -Patient/Caregiver will call provider office for new concerns or questions as evidenced by review of documented incoming telephone call notes and patient report -Patient/Caregiver verbalizes understanding of plan -Patient/Caregiver will focus on medication adherence by taking medications as prescribed  -Calls provider office for new concerns, questions, or BP outside discussed parameters -Checks BP and records as discussed --continue to stand still to get his bearing before walking and use a cane if needed -follow his learned exercises from physical therapy to maintain what he has learned and built strength         Our next appointment is by telephone on 04/12/23 at 1130 am  Please call the care guide team at [phone] if you need to cancel or reschedule your appointment.   If you are experiencing a Mental Health or Behavioral Health Crisis or need someone to talk to, please call 1-800-273-TALK (toll free, 24 hour hotline)  Patient verbalizes understanding of instructions and care plan provided today and agrees to view in MyChart. Active MyChart status and patient understanding of how to access instructions and  care plan via MyChart confirmed with patient.     Juanell Fairly RN, BSN, [facility] Care Coordinator [facility]   Phone: [phone]

## 2023-03-11 NOTE — Patient Outreach (Signed)
  Care Coordination   Follow Up Visit Note   03/09/2023 Name: Jose Li MRN: [id number] DOB: 02-06-1956  Jose Li is a 67 y.o. year old male who sees Bess Kinds, MD for primary care. I spoke with  Jose Li by phone today.  What matters to the patients health and wellness today?  It's great news to report that Mr. Kuenzel is making good progress in his recovery. He has been experiencing stable blood pressure readings consistently around 130/80, which is a positive indication of his health improving. Additionally, he has successfully completed his physical therapy. He has expressed an interest in speaking with Marylu Lund to discover opportunities for social activities where he can meet new people.    Goals Addressed             This Visit's Progress    To manage and monitor his low blood pressure and falls       Patient Goals/Self Care Activities: -Patient/Caregiver will self-administer medications as prescribed as evidenced by self-report/primary caregiver report  -Patient/Caregiver will attend all scheduled provider appointments as evidenced by clinician review of documented attendance to scheduled appointments and patient/caregiver report -Patient/Caregiver will call pharmacy for medication refills as evidenced by patient report and review of pharmacy fill history as appropriate -Patient/Caregiver will call provider office for new concerns or questions as evidenced by review of documented incoming telephone call notes and patient report -Patient/Caregiver verbalizes understanding of plan -Patient/Caregiver will focus on medication adherence by taking medications as prescribed  -Calls provider office for new concerns, questions, or BP outside discussed parameters -Checks BP and records as discussed --continue to stand still to get his bearing before walking and use a cane if needed -follow his learned exercises from physical therapy to maintain what he has learned and built  strength         SDOH assessments and interventions completed:  No     Care Coordination Interventions:  Yes, provided   Interventions Today    Flowsheet Row Most Recent Value  Chronic Disease   Chronic disease during today's visit Hypertension (HTN)  General Interventions   General Interventions Discussed/Reviewed General Interventions Discussed  Exercise Interventions   Exercise Discussed/Reviewed Physical Activity  Physical Activity Discussed/Reviewed Physical Activity Discussed  Pharmacy Interventions   Pharmacy Dicussed/Reviewed Pharmacy Topics Discussed  Safety Interventions   Safety Discussed/Reviewed Safety Discussed       Follow up plan: Follow up call scheduled for 04/12/23 1130 am    Encounter Outcome:  Pt. Visit Completed   Juanell Fairly RN, BSN, [facility] Care Coordinator [facility]   Phone: [phone]

## 2023-03-14 ENCOUNTER — Telehealth: Payer: Self-pay | Admitting: *Deleted

## 2023-03-14 NOTE — Patient Outreach (Signed)
  Care Coordination   Follow Up Visit Note   03/14/2023 Name: Jose Li MRN: [id number] DOB: 28-May-1956  Jose Li is a 67 y.o. year old male who sees Bess Kinds, MD for primary care. I spoke with  Jose Li by phone today.  What matters to the patients health and wellness today?  Needs transportation support.    Goals Addressed             This Visit's Progress    Seek opportunities to enhance quality of life       Care Coordination Interventions: Assessed Social Determinants of Health Depression screen reviewed  PHQ2/ PHQ9 completed Solution-Focused Strategies employed:  Active listening / Reflection utilized  Problem Solving /Task Center strategies reviewed Provided general psycho-education for mental health needs  Reviewed mental health medications and discussed importance of compliance:  Participation in counseling encouraged : pt declines at this time Participation in support group encouraged : declines at this time Consider volunteer opportunities   Transportation provided by insurance provider ( )  [employer] transportation (will send an application to  PCP to complete and fax to [facility] for you) Solution-Focused Strategies employed:  Problem Solving /Task Center strategies reviewed Continue to remain alcohol free- great job         SDOH assessments and interventions completed:  Yes     Care Coordination Interventions:  Yes, provided  Interventions Today    Flowsheet Row Most Recent Value  Chronic Disease   Chronic disease during today's visit Hypertension (HTN)  General Interventions   General Interventions Discussed/Reviewed [facility]  [SCAT application to be completed by PCP office]       Follow up plan: Follow up call scheduled for 04/07/23    Encounter Outcome:  Pt. Visit Completed

## 2023-03-14 NOTE — Telephone Encounter (Signed)
Received an email from [place] with [facility] with a scat form that needs to be completed for this patient by his PCP.  Clinic staff portion of form completed and placed in providers box.  Will ask provider to return to me when done and I will send it back to [place].  Marzell Allemand,CMA

## 2023-03-14 NOTE — Patient Instructions (Signed)
Visit Information  Thank you for taking time to visit with me today. Please don't hesitate to contact me if I can be of assistance to you.   Following are the goals we discussed today:   Goals Addressed             This Visit's Progress    Seek opportunities to enhance quality of life       Care Coordination Interventions: Assessed Social Determinants of Health Depression screen reviewed  PHQ2/ PHQ9 completed Solution-Focused Strategies employed:  Active listening / Reflection utilized  Problem Solving /Task Center strategies reviewed Provided general psycho-education for mental health needs  Reviewed mental health medications and discussed importance of compliance:  Participation in counseling encouraged : pt declines at this time Participation in support group encouraged : declines at this time Consider volunteer opportunities   Transportation provided by insurance provider ( )  [employer] transportation (will send an application to  PCP to complete and fax to [facility] for you) Solution-Focused Strategies employed:  Problem Solving /Task Center strategies reviewed Continue to remain alcohol free- great job         Our next appointment is by telephone on 04/07/23   Please call the care guide team at [phone] if you need to cancel or reschedule your appointment.   If you are experiencing a Mental Health or Behavioral Health Crisis or need someone to talk to, please call the Suicide and Crisis Lifeline: 988 call 911   The patient verbalized understanding of instructions, educational materials, and care plan provided today and DECLINED offer to receive copy of patient instructions, educational materials, and care plan.   Telephone follow up appointment with care management team member scheduled for:04/07/23  Reece Levy, MSW, LCSW Clinical Social Worker [facility] [phone]

## 2023-03-15 ENCOUNTER — Other Ambulatory Visit: Payer: Self-pay | Admitting: Urology

## 2023-03-15 DIAGNOSIS — N200 Calculus of kidney: Secondary | ICD-10-CM

## 2023-03-17 ENCOUNTER — Encounter: Payer: Self-pay | Admitting: Urology

## 2023-03-17 ENCOUNTER — Ambulatory Visit (HOSPITAL_BASED_OUTPATIENT_CLINIC_OR_DEPARTMENT_OTHER)
Admission: RE | Admit: 2023-03-17 | Discharge: 2023-03-17 | Disposition: A | Discharge status: Home / Self Care | Payer: 59 | Source: Ambulatory Visit | Attending: Urology | Admitting: Urology

## 2023-03-17 ENCOUNTER — Other Ambulatory Visit: Payer: 59 | Admitting: Urology

## 2023-03-17 ENCOUNTER — Telehealth: Payer: Self-pay | Admitting: Urology

## 2023-03-17 ENCOUNTER — Ambulatory Visit (INDEPENDENT_AMBULATORY_CARE_PROVIDER_SITE_OTHER): Payer: 59 | Admitting: Urology

## 2023-03-17 ENCOUNTER — Ambulatory Visit: Payer: Self-pay | Admitting: Urology

## 2023-03-17 VITALS — BP 148/79 | HR 76

## 2023-03-17 DIAGNOSIS — N401 Enlarged prostate with lower urinary tract symptoms: Secondary | ICD-10-CM | POA: Diagnosis not present

## 2023-03-17 DIAGNOSIS — N200 Calculus of kidney: Secondary | ICD-10-CM

## 2023-03-17 DIAGNOSIS — R3129 Other microscopic hematuria: Secondary | ICD-10-CM

## 2023-03-17 DIAGNOSIS — R143 Flatulence: Secondary | ICD-10-CM | POA: Diagnosis not present

## 2023-03-17 MED ORDER — FINASTERIDE 5 MG PO TABS
5.0000 mg | ORAL_TABLET | Freq: Every day | ORAL | 3 refills | Status: DC
Start: 2023-03-17 — End: 2024-01-31

## 2023-03-17 MED ORDER — FINASTERIDE 5 MG PO TABS
5.0000 mg | ORAL_TABLET | Freq: Every day | ORAL | 3 refills | Status: DC
Start: 2023-03-17 — End: 2023-03-17

## 2023-03-17 NOTE — Telephone Encounter (Signed)
Patient called and LVM that his pain is getting worse and he said as soon as he can get in the hospital, please get him in to have the surgery done. He said he can do it any day except Monday.  Patients callback #: [phone]

## 2023-03-17 NOTE — H&P (View-Only) (Signed)
 Assessment: 1. Nephrolithiasis   2. Benign localized prostatic hyperplasia with lower urinary tract symptoms (LUTS)   3. Microscopic hematuria     Plan: Will add finasteride to tamsulosin for comb med tx.  Today I had a long discussion with the patient regarding the finding of a 6 mm right UPJ stone.  It is not well-visualized on scout/plain film therefore I have recommended endoscopic management.  Today I discussed proceeding with cystoscopy, right retrograde pyelogram, ureteroscopy with laser lithotripsy and double-J stent placement.  I emphasized to the patient the potential possibility of not being able to access the stone at the initial procedure and thus may need prestenting.  He is expresses understanding of these issues and agrees to proceed.  Will schedule next available.  Addendum:  on careful review of his kub with radiologist, it appears that the right ureteral stone is visible.  Discussed option of ESL.  Patient would prefer this approach.  Will schedule for ESL.  If unable to localize he is on the schedule for URS the following day.  Chief Complaint: Hematuria/luts  HPI: Jose Li is a 67 y.o. male who presents for continued evaluation of LUTS and hematuria. Patient reports continued intermittent right-sided flank pain.  No recent gross hematuria.  Lower urinary tract symptoms are stable on tamsulosin.  PSA 02/2023= 0.6  CT stone study 03/10/23 IMPRESSION: 6 mm proximal right ureteral calculus at the L3 level. Mild right hydroureteronephrosis. Additional bilateral nonobstructing renal calculi measuring up to 3 mm. Cholelithiasis, without associated inflammatory changes.  Portions of the above documentation were copied from a prior visit for review purposes only.  Allergies: Allergies  Allergen Reactions   Cymbalta [Duloxetine Hcl] Other (See Comments)    Hyponatremia    Erythromycin Other (See Comments)    Unknown reation   Hctz [Hydrochlorothiazide] Other  (See Comments)    Syncope   Inderal [Propranolol] Other (See Comments)    Syncope     PMH: Past Medical History:  Diagnosis Date   Acute kidney injury (HCC) 05/26/2018   Allergy    Anemia    Anxiety    Arthritis    LEGS,BACK   Cataract    BILATERAL   Depression    Hyperlipidemia    Hypertension, essential, benign 08/02/2012   Orthostatic hypotension 10/09/2013   OSA (obstructive sleep apnea) 10/01/2015   Severe OSA with an AHI of 86/hr now on BiPAP at 23/19cm H2O   Rhabdomyolysis 08/24/2022   Seizures (HCC)    none for last 6-7months   Sleep apnea    Substance abuse (HCC)     PSH: Past Surgical History:  Procedure Laterality Date   COLONOSCOPY     CRANIOTOMY Left 06/08/2022   Procedure: CRANIOTOMY HEMATOMA EVACUATION SUBDURAL;  Surgeon: Pool, Henry, MD;  Location: [facility] OR;  Service: Neurosurgery;  Laterality: Left;   FRACTURE SURGERY  1990   right hip and femur   HERNIA REPAIR  07/2009   umbilical hernia/ got infected   JOINT REPLACEMENT Right 1990   right hip   TRACHEOSTOMY  08/1999   Had pneumoniaand was in a coma 10 days    SH: Social History   Tobacco Use   Smoking status: Every Day    Packs/day: 1.00    Years: 30.00    Additional pack years: 0.00    Total pack years: 30.00    Types: Cigarettes    Passive exposure: Current   Smokeless tobacco: Never  Vaping Use   Vaping Use: Never   used  Substance Use Topics   Alcohol use: No    Comment: history of alcohol abuse.   Drug use: Not Currently    Comment: Hx of cocaine use "years ago"    ROS: Constitutional:  Negative for fever, chills, weight loss CV: Negative for chest pain, previous MI, hypertension Respiratory:  Negative for shortness of breath, wheezing, sleep apnea, frequent cough GI:  Negative for nausea, vomiting, bloody stool, GERD  PE: There were no vitals taken for this visit. GENERAL APPEARANCE:  Well appearing, well developed, well nourished, NAD HEENT:  Atraumatic,  normocephalic COR:  RR LUNGS:  CTA ABDOMEN:  Soft, non-tender, no masses EXTREMITIES:  Moves all extremities well NEUROLOGIC:  Alert and oriented x 3 MENTAL STATUS:  appropriate SKIN:  Warm, dry, and intact   Results: UA neg for infection 

## 2023-03-17 NOTE — Progress Notes (Addendum)
Assessment: 1. Nephrolithiasis   2. Benign localized prostatic hyperplasia with lower urinary tract symptoms (LUTS)   3. Microscopic hematuria     Plan: Will add finasteride to tamsulosin for comb med tx.  Today I had a long discussion with the patient regarding the finding of a 6 mm right UPJ stone.  It is not well-visualized on scout/plain film therefore I have recommended endoscopic management.  Today I discussed proceeding with cystoscopy, right retrograde pyelogram, ureteroscopy with laser lithotripsy and double-J stent placement.  I emphasized to the patient the potential possibility of not being able to access the stone at the initial procedure and thus may need prestenting.  He is expresses understanding of these issues and agrees to proceed.  Will schedule next available.  Addendum:  on careful review of his kub with radiologist, it appears that the right ureteral stone is visible.  Discussed option of ESL.  Patient would prefer this approach.  Will schedule for ESL.  If unable to localize he is on the schedule for URS the following day.  Chief Complaint: Hematuria/luts  HPI: Jose Li is a 67 y.o. male who presents for continued evaluation of LUTS and hematuria. Patient reports continued intermittent right-sided flank pain.  No recent gross hematuria.  Lower urinary tract symptoms are stable on tamsulosin.  PSA 02/2023= 0.6  CT stone study 03/10/23 IMPRESSION: 6 mm proximal right ureteral calculus at the L3 level. Mild right hydroureteronephrosis. Additional bilateral nonobstructing renal calculi measuring up to 3 mm. Cholelithiasis, without associated inflammatory changes.  Portions of the above documentation were copied from a prior visit for review purposes only.  Allergies: Allergies  Allergen Reactions   Cymbalta [Duloxetine Hcl] Other (See Comments)    Hyponatremia    Erythromycin Other (See Comments)    Unknown reation   Hctz [Hydrochlorothiazide] Other  (See Comments)    Syncope   Inderal [Propranolol] Other (See Comments)    Syncope     PMH: Past Medical History:  Diagnosis Date   Acute kidney injury (HCC) 05/26/2018   Allergy    Anemia    Anxiety    Arthritis    LEGS,BACK   Cataract    BILATERAL   Depression    Hyperlipidemia    Hypertension, essential, benign 08/02/2012   Orthostatic hypotension 10/09/2013   OSA (obstructive sleep apnea) 10/01/2015   Severe OSA with an AHI of 86/hr now on BiPAP at 23/19cm H2O   Rhabdomyolysis 08/24/2022   Seizures (HCC)    none for last 6-39months   Sleep apnea    Substance abuse (HCC)     PSH: Past Surgical History:  Procedure Laterality Date   COLONOSCOPY     CRANIOTOMY Left 06/08/2022   Procedure: CRANIOTOMY HEMATOMA EVACUATION SUBDURAL;  Surgeon: Julio Sicks, MD;  Location: [facility] OR;  Service: Neurosurgery;  Laterality: Left;   FRACTURE SURGERY  1990   right hip and femur   HERNIA REPAIR  07/2009   umbilical hernia/ got infected   JOINT REPLACEMENT Right 1990   right hip   TRACHEOSTOMY  08/1999   Had pneumoniaand was in a coma 10 days    SH: Social History   Tobacco Use   Smoking status: Every Day    Packs/day: 1.00    Years: 30.00    Additional pack years: 0.00    Total pack years: 30.00    Types: Cigarettes    Passive exposure: Current   Smokeless tobacco: Never  Vaping Use   Vaping Use: Never  used  Substance Use Topics   Alcohol use: No    Comment: history of alcohol abuse.   Drug use: Not Currently    Comment: Hx of cocaine use "years ago"    ROS: Constitutional:  Negative for fever, chills, weight loss CV: Negative for chest pain, previous MI, hypertension Respiratory:  Negative for shortness of breath, wheezing, sleep apnea, frequent cough GI:  Negative for nausea, vomiting, bloody stool, GERD  PE: There were no vitals taken for this visit. GENERAL APPEARANCE:  Well appearing, well developed, well nourished, NAD HEENT:  Atraumatic,  normocephalic COR:  RR LUNGS:  CTA ABDOMEN:  Soft, non-tender, no masses EXTREMITIES:  Moves all extremities well NEUROLOGIC:  Alert and oriented x 3 MENTAL STATUS:  appropriate SKIN:  Warm, dry, and intact   Results: UA neg for infection

## 2023-03-18 LAB — URINALYSIS, ROUTINE W REFLEX MICROSCOPIC
Bilirubin, UA: NEGATIVE
Glucose, UA: NEGATIVE
Ketones, UA: NEGATIVE
Nitrite, UA: NEGATIVE
Protein,UA: NEGATIVE
Specific Gravity, UA: 1.01 (ref 1.005–1.030)
Urobilinogen, Ur: 0.2 mg/dL (ref 0.2–1.0)
pH, UA: 6 (ref 5.0–7.5)

## 2023-03-18 LAB — MICROSCOPIC EXAMINATION
Cast Type: NONE SEEN
Casts: NONE SEEN /lpf
Crystal Type: NONE SEEN
Crystals: NONE SEEN
Epithelial Cells (non renal): NONE SEEN /hpf (ref 0–10)
Mucus, UA: NONE SEEN
Trichomonas, UA: NONE SEEN
Yeast, UA: NONE SEEN

## 2023-03-18 NOTE — Patient Instructions (Signed)
SURGICAL WAITING ROOM VISITATION  Patients having surgery or a procedure may have no more than 2 support people in the waiting area - these visitors may rotate.    Children under the age of 34 must have an adult with them who is not the patient.  Due to an increase in RSV and influenza rates and associated hospitalizations, children ages 84 and under may not visit patients in [facility] hospitals.  If the patient needs to stay at the hospital during part of their recovery, the visitor guidelines for inpatient rooms apply. Pre-op nurse will coordinate an appropriate time for 1 support person to accompany patient in pre-op.  This support person may not rotate.    Please refer to the [facility] website for the visitor guidelines for Inpatients (after your surgery is over and you are in a regular room).    Your procedure is scheduled on: 03/29/23   Report to [facility] Main Entrance    Report to admitting at 1:15 PM   Call this number if you have problems the morning of surgery [phone]   Do not eat food or drink liquids :After Midnight.  FOLLOW BOWEL PREP AND ANY ADDITIONAL PRE OP INSTRUCTIONS YOU RECEIVED FROM YOUR SURGEON'S OFFICE!!!     Oral Hygiene is also important to reduce your risk of infection.                                    Remember - BRUSH YOUR TEETH THE MORNING OF SURGERY WITH YOUR REGULAR TOOTHPASTE  DENTURES WILL BE REMOVED PRIOR TO SURGERY PLEASE DO NOT APPLY "Poly grip" OR ADHESIVES!!!   Do NOT smoke after Midnight   Take these medicines the morning of surgery with A SIP OF WATER:   Tylenol  Atorvastatin  Buspirone  Cetrizine  Citaprolam  Finasteride  Keppra  Midodrine  Pregabalin   Bring CPAP mask and tubing day of surgery.                              You may not have any metal on your body including hair pins, jewelry, and body piercing             Do not wear make-up, lotions, powders ,cologne, or deodorant              Men may  shave face and neck.   Do not bring valuables to the hospital. [facility] IS NOT             RESPONSIBLE   FOR VALUABLES.   Contacts, glasses, dentures or bridgework may not be worn into surgery.   DO NOT BRING YOUR HOME MEDICATIONS TO THE HOSPITAL. PHARMACY WILL DISPENSE MEDICATIONS LISTED ON YOUR MEDICATION LIST TO YOU DURING YOUR ADMISSION IN THE HOSPITAL!    Patients discharged on the day of surgery will not be allowed to drive home.  Someone NEEDS to stay with you for the first 24 hours after anesthesia.   Special Instructions: Bring a copy of your healthcare power of attorney and living will documents the day of surgery if you haven't scanned them before.              Please read over the following fact sheets you were given: IF YOU HAVE QUESTIONS ABOUT YOUR PRE-OP INSTRUCTIONS PLEASE CALL [name]    If you received a  COVID test during your pre-op visit  it is requested that you wear a mask when out in public, stay away from anyone that may not be feeling well and notify your surgeon if you develop symptoms. If you test positive for Covid or have been in contact with anyone that has tested positive in the last 10 days please notify you surgeon.    [place] - Preparing for Surgery Before surgery, you can play an important role.  Because skin is not sterile, your skin needs to be as free of germs as possible.  You can reduce the number of germs on your skin by washing with CHG (chlorahexidine gluconate) soap before surgery.  CHG is an antiseptic cleaner which kills germs and bonds with the skin to continue killing germs even after washing. Please DO NOT use if you have an allergy to CHG or antibacterial soaps.  If your skin becomes reddened/irritated stop using the CHG and inform your nurse when you arrive at Short Stay. Do not shave (including legs and underarms) for at least 48 hours prior to the first CHG shower.  You may shave your face/neck.  Please follow these  instructions carefully:  1.  Shower with CHG Soap the night before surgery and the  morning of surgery.  2.  If you choose to wash your hair, wash your hair first as usual with your normal  shampoo.  3.  After you shampoo, rinse your hair and body thoroughly to remove the shampoo.                             4.  Use CHG as you would any other liquid soap.  You can apply chg directly to the skin and wash.  Gently with a scrungie or clean washcloth.  5.  Apply the CHG Soap to your body ONLY FROM THE NECK DOWN.   Do   not use on face/ open                           Wound or open sores. Avoid contact with eyes, ears mouth and   genitals (private parts).                       Wash face,  Genitals (private parts) with your normal soap.             6.  Wash thoroughly, paying special attention to the area where your    surgery  will be performed.  7.  Thoroughly rinse your body with warm water from the neck down.  8.  DO NOT shower/wash with your normal soap after using and rinsing off the CHG Soap.                9.  Pat yourself dry with a clean towel.            10.  Wear clean pajamas.            11.  Place clean sheets on your bed the night of your first shower and do not  sleep with pets. Day of Surgery : Do not apply any lotions/deodorants the morning of surgery.  Please wear clean clothes to the hospital/surgery center.  FAILURE TO FOLLOW THESE INSTRUCTIONS MAY RESULT IN THE CANCELLATION OF YOUR SURGERY  PATIENT SIGNATURE_________________________________  NURSE SIGNATURE__________________________________  ________________________________________________________________________

## 2023-03-18 NOTE — Telephone Encounter (Signed)
Patient called back and left a voicemail at 5:20pm stating he is still in severe pain and will try to make it through the night but needs a call back regarding this.   Patients callback # [phone]

## 2023-03-18 NOTE — Telephone Encounter (Signed)
Spoke with patient, he is aware that if he gets into severe pain, he should go to the ER for treatment. Otherwise, his surgery is scheduled for 03/29/23. Pt verbalized understanding.

## 2023-03-18 NOTE — Progress Notes (Addendum)
COVID Vaccine received:  []  No [x]  Yes Date of any COVID positive Test in last 90 days:  PCP - Bess Kinds Cardiologist - Dietrich Pates  Chest x-ray - 08/24/22  Epic EKG -  08/24/22  Epic Stress Test -  ECHO - 02/08/16  Epic Cardiac Cath -   Bowel Prep - []  No  []   Yes ______  Pacemaker / ICD device []  No []  Yes   Spinal Cord Stimulator:[]  No []  Yes       History of Sleep Apnea? []  No [x]  Yes   CPAP used?- []  No []  Yes    Does the patient monitor blood sugar?          []  No []  Yes  [x]  N/A  Patient has: [x]  NO Hx DM   []  Pre-DM                 []  DM1  []   DM2 Does patient have a [employer] or [employer]? []  No []  Yes   Fasting Blood Sugar Ranges-  Checks Blood Sugar _____ times a day  GLP1 agonist / usual dose -  GLP1 instructions:  SGLT-2 inhibitors / usual dose -  SGLT-2 instructions:   Blood Thinner / Instructions: Aspirin Instructions:  Comments: ASA 81mg   Activity level: Patient is able / unable to climb a flight of stairs without difficulty; []  No CP  []  No SOB, but would have ___   Patient can / can not perform ADLs without assistance.   Anesthesia review:   Patient denies shortness of breath, fever, cough and chest pain at PAT appointment.  Patient verbalized understanding and agreement to the Pre-Surgical Instructions that were given to them at this PAT appointment. Patient was also educated of the need to review these PAT instructions again prior to his/her surgery.I reviewed the appropriate phone numbers to call if they have any and questions or concerns.

## 2023-03-21 NOTE — Progress Notes (Signed)
COVID Vaccine received:  []  No [x]  Yes Date of any COVID positive Test in last 90 days:   PCP - Bess Kinds Cardiologist - Dietrich Pates, MD   Chest x-ray - 08/24/22  Epic EKG -  08/24/22  Epic Stress Test -  ECHO - 02/08/16  Epic Cardiac Cath -    Bowel Prep - []  No  []   Yes ______   Pacemaker / ICD device []  No []  Yes   Spinal Cord Stimulator:[]  No []  Yes       History of Sleep Apnea? []  No [x]  Yes   CPAP used?- []  No []  Yes     Does the patient monitor blood sugar?          []  No []  Yes  [x]  N/A   Patient has: [x]  NO Hx DM   []  Pre-DM                 []  DM1  []   DM2 Does patient have a [employer] or [employer]? []  No []  Yes   Fasting Blood Sugar Ranges-  Checks Blood Sugar _____ times a day   GLP1 agonist / usual dose -  GLP1 instructions:  SGLT-2 inhibitors / usual dose -  SGLT-2 instructions:    Blood Thinner / Instructions: Aspirin Instructions:   Comments: ASA 81mg    Activity level: Patient is able / unable to climb a flight of stairs without difficulty; []  No CP  []  No SOB, but would have ___   Patient can / can not perform ADLs without assistance.    Anesthesia review: HTN, OSA, hypotension, SDH, seizures, t wave abnormalities    Patient denies shortness of breath, fever, cough and chest pain at PAT appointment.   Patient verbalized understanding and agreement to the Pre-Surgical Instructions that were given to them at this PAT appointment. Patient was also educated of the need to review these PAT instructions again prior to his/her surgery.I reviewed the appropriate phone numbers to call if they have any and questions or concerns.

## 2023-03-21 NOTE — Progress Notes (Signed)
Cardiology Office Note   Date:  03/21/2023   ID:  LAD PLANE, DOB 13-Sep-1956, MRN [id number]  PCP:  Bess Kinds, MD  Cardiologist:   Dietrich Pates, MD   F/U of dizziness, orthostatic intolerance    History of Present Illness: Jose Li is a 67 y.o. male with a history of dizziness, syncope, autonomic dysfunction, mild CAD (on CT scan), bipolar disorder, OSA (on BiPAP),, hypokalemia, seizures due to EtOH withdrawal., HL, tobacco use, EtOH abuse, HTN, BPH and hypokalemia The pt was hospitalized 05/2022-06/2022  for L sided subdural hematoma, s/p craniotomy   Complicated by delirium   Discharged to SNF.   The pt was seen in ER on 08/24/22 with weakness, falls, CK elevation.  Received IV fluids   Discharged to a SNF. Note the pt has had problems with dizziness when psych meds have  been changed   I saw the pt in clinic in Jan 2024    He had some dizziness at the time and BP dropped over 20 points with standing.   Recomm continued midodrine and increased fluid intake  The patient is currently having problems/ pain with kidney stone   Notes pain.  Plan for cystoscopy and possible lithotripsy on Mon 03/29/23  HE denies dizziness   Says his breathing is OK  NO CP   Still smoking 1 ppd     Allergies:   Cymbalta [duloxetine hcl], Erythromycin, Hctz [hydrochlorothiazide], and Inderal [propranolol]   Past Medical History:  Diagnosis Date   Acute kidney injury (HCC) 05/26/2018   Allergy    Anemia    Anxiety    Arthritis    LEGS,BACK   Cataract    BILATERAL   Depression    Hyperlipidemia    Hypertension, essential, benign 08/02/2012   Orthostatic hypotension 10/09/2013   OSA (obstructive sleep apnea) 10/01/2015   Severe OSA with an AHI of 86/hr now on BiPAP at 23/19cm H2O   Rhabdomyolysis 08/24/2022   Seizures (HCC)    none for last 6-43months   Sleep apnea    Substance abuse (HCC)     Past Surgical History:  Procedure Laterality Date   COLONOSCOPY     CRANIOTOMY Left  06/08/2022   Procedure: CRANIOTOMY HEMATOMA EVACUATION SUBDURAL;  Surgeon: Julio Sicks, MD;  Location: [facility];  Service: Neurosurgery;  Laterality: Left;   FRACTURE SURGERY  1990   right hip and femur   HERNIA REPAIR  07/2009   umbilical hernia/ got infected   JOINT REPLACEMENT Right 1990   right hip   TRACHEOSTOMY  08/1999   Had pneumoniaand was in a coma 10 days     Social History:  The patient  reports that he has been smoking cigarettes. He has a 30.00 pack-year smoking history. He has been exposed to tobacco smoke. He has never used smokeless tobacco. He reports that he does not currently use drugs. He reports that he does not drink alcohol.   Family History:  The patient's family history includes Diabetes in his father; Heart disease in his brother and mother.    ROS:  Please see the history of present illness. All other systems are reviewed and  Negative to the above problem except as noted.   BP 124/66.   P 66  W 175.2   Sat 97%   GEN: Pt is a 67 yo in NAD  HEENT: normal  Neck:   JVP is normal  No bruits   Cardiac: RRR; no murmurs.  No lower extremity  edema    Respiratory:  clear to auscultation  GI  Soft  Nontender   No hepatomegaly    EKG:  EKG is not ordered today.    Lipid Panel    Component Value Date/Time   CHOL 134 12/13/2022 1246   TRIG 146 12/13/2022 1246   HDL 36 (L) 12/13/2022 1246   CHOLHDL 3.7 12/13/2022 1246   CHOLHDL 5.3 (H) 12/29/2015 1146   VLDL 79 (H) 12/29/2015 1146   LDLCALC 72 12/13/2022 1246      Wt Readings from Last 3 Encounters:  02/08/23 165 lb (74.8 kg)  01/24/23 172 lb (78 kg)  12/30/22 173 lb (78.5 kg)      ASSESSMENT AND PLAN: 1  Autonomic dysfunction  Patient currently denies dizziness   He is in pain with kidney stones, may be keeping BP up I would  continue midodrine 3x per day    Stay hydrated     2  CAD   Mild plaquing on CT   Follow   No symptoms     4  HL  In Jan 2024 LDL was 72  HDL 36  trig  161  Keep on  lipitor  4 Hx of  Hypokalemia  K 4.1 on 03/22/23   Keep on same regimen    5  Tob abuse   Counselled again on cessation.    6  Preop  Plan for cystoscopy, lithotripsy.    From cardiac standpoint OK to proceed   Hydrate   Current medicines are reviewed at length with the patient today.  The patient does not have concerns regarding medicines.  Signed, Dietrich Pates, MD  03/21/2023 10:42 PM    [facility] [address] [place], [place], [place]  [address] Phone: [phone]; Fax: [phone]

## 2023-03-22 ENCOUNTER — Encounter (HOSPITAL_COMMUNITY)
Admission: RE | Admit: 2023-03-22 | Discharge: 2023-03-22 | Disposition: A | Discharge status: Home / Self Care | Payer: 59 | Source: Ambulatory Visit | Attending: Urology | Admitting: Urology

## 2023-03-22 ENCOUNTER — Other Ambulatory Visit: Payer: Self-pay

## 2023-03-22 ENCOUNTER — Other Ambulatory Visit: Payer: 59 | Admitting: Urology

## 2023-03-22 ENCOUNTER — Encounter (HOSPITAL_COMMUNITY): Payer: Self-pay

## 2023-03-22 VITALS — BP 137/67 | HR 81 | Temp 98.4°F | Resp 18 | Ht 72.0 in | Wt 173.0 lb

## 2023-03-22 DIAGNOSIS — I129 Hypertensive chronic kidney disease with stage 1 through stage 4 chronic kidney disease, or unspecified chronic kidney disease: Secondary | ICD-10-CM | POA: Diagnosis not present

## 2023-03-22 DIAGNOSIS — I10 Essential (primary) hypertension: Secondary | ICD-10-CM

## 2023-03-22 DIAGNOSIS — Z9889 Other specified postprocedural states: Secondary | ICD-10-CM | POA: Diagnosis not present

## 2023-03-22 DIAGNOSIS — N183 Chronic kidney disease, stage 3 unspecified: Secondary | ICD-10-CM | POA: Diagnosis not present

## 2023-03-22 DIAGNOSIS — F319 Bipolar disorder, unspecified: Secondary | ICD-10-CM | POA: Diagnosis not present

## 2023-03-22 DIAGNOSIS — N201 Calculus of ureter: Secondary | ICD-10-CM | POA: Insufficient documentation

## 2023-03-22 DIAGNOSIS — Z01812 Encounter for preprocedural laboratory examination: Secondary | ICD-10-CM | POA: Insufficient documentation

## 2023-03-22 DIAGNOSIS — G40909 Epilepsy, unspecified, not intractable, without status epilepticus: Secondary | ICD-10-CM | POA: Diagnosis not present

## 2023-03-22 DIAGNOSIS — F458 Other somatoform disorders: Secondary | ICD-10-CM | POA: Diagnosis not present

## 2023-03-22 DIAGNOSIS — F17210 Nicotine dependence, cigarettes, uncomplicated: Secondary | ICD-10-CM | POA: Diagnosis not present

## 2023-03-22 DIAGNOSIS — G4733 Obstructive sleep apnea (adult) (pediatric): Secondary | ICD-10-CM | POA: Diagnosis not present

## 2023-03-22 HISTORY — DX: Pneumonia, unspecified organism: J18.9

## 2023-03-22 HISTORY — DX: Chronic kidney disease, stage 3 unspecified: N18.30

## 2023-03-22 HISTORY — DX: Personal history of urinary calculi: Z87.442

## 2023-03-22 LAB — BASIC METABOLIC PANEL
Anion gap: 5 (ref 5–15)
BUN: 14 mg/dL (ref 8–23)
CO2: 23 mmol/L (ref 22–32)
Calcium: 8.5 mg/dL — ABNORMAL LOW (ref 8.9–10.3)
Chloride: 108 mmol/L (ref 98–111)
Creatinine, Ser: 1.23 mg/dL (ref 0.61–1.24)
GFR, Estimated: >60 mL/min (ref >60)
Glucose, Bld: 99 mg/dL (ref 70–99)
Potassium: 4.1 mmol/L (ref 3.5–5.1)
Sodium: 136 mmol/L (ref 135–145)

## 2023-03-22 LAB — CBC
HCT: 37.2 % — ABNORMAL LOW (ref 39.0–52.0)
Hemoglobin: 12 g/dL — ABNORMAL LOW (ref 13.0–17.0)
MCH: 28.2 pg (ref 26.0–34.0)
MCHC: 32.3 g/dL (ref 30.0–36.0)
MCV: 87.5 fL (ref 80.0–100.0)
Platelets: 266 10*3/uL (ref 150–400)
RBC: 4.25 MIL/uL (ref 4.22–5.81)
RDW: 14.2 % (ref 11.5–15.5)
WBC: 6.2 10*3/uL (ref 4.0–10.5)
nRBC: 0 % (ref 0.0–0.2)

## 2023-03-23 ENCOUNTER — Ambulatory Visit: Payer: Self-pay | Admitting: Urology

## 2023-03-23 ENCOUNTER — Encounter (HOSPITAL_BASED_OUTPATIENT_CLINIC_OR_DEPARTMENT_OTHER): Payer: Self-pay | Admitting: Urology

## 2023-03-23 DIAGNOSIS — N201 Calculus of ureter: Secondary | ICD-10-CM

## 2023-03-23 NOTE — Progress Notes (Addendum)
Anesthesia chart review   Case: [id number] Date/Time: 03/29/23 1515   Procedure: CYSTOSCOPY WITH RETROGRADE PYELOGRAM, URETEROSCOPY AND STENT PLACEMENT (Right)   Anesthesia type: General   Pre-op diagnosis: right proximal ureteral stone   Location: [facility] / [facility]   Surgeons: [name], MD       DISCUSSION: 66 year old smoker with history of bipolar disorder, HTN, OSA, seizures, autonomic dysfunction, CKD stage III, right proximal ureteral stone scheduled for above procedure 03/29/2023 with Dr. Marcha Solders.  Patient follows with neurology for seizures, status post craniotomy 06/07/2022 for left-sided subdural hematoma after a fall.  Patient last seen by neurology 12/30/2022.  Denies recent seizures.   Last seen by cardiology 12/13/2022.  No changes made at this visit with 65-month follow-up scheduled.  Follows for dizziness and orthostatic intolerance.  Appointment is 03/24/2023.  Seen by cardiology 03/24/2023.  Per office visit note, "Preop Plan for cystoscopy, lithotripsy. From cardiac standpoint OK to proceed Hydrate" VS: BP 137/67   Pulse 81   Temp 36.9 C (Oral)   Resp 18   Ht 6' (1.829 m)   Wt 78.5 kg   SpO2 99%   BMI 23.46 kg/m   PROVIDERS: Bess Kinds, MD is PCP  Dietrich Pates, MD is cardiologist LABS: Labs reviewed: Acceptable for surgery. (all labs ordered are listed, but only abnormal results are displayed)  Labs Reviewed  BASIC METABOLIC PANEL - Abnormal; Notable for the following components:      Result Value   Calcium 8.5 (*)    All other components within normal limits  CBC - Abnormal; Notable for the following components:   Hemoglobin 12.0 (*)    HCT 37.2 (*)    All other components within normal limits     IMAGES:   EKG:   CV: Echo 02/08/2016 - Left ventricle: The cavity size was normal. Systolic function was    normal. The estimated ejection fraction was in the range of 60%    to 65%. Wall motion was normal; there were no regional  wall    motion abnormalities. There was an increased relative    contribution of atrial contraction to ventricular filling.    Doppler parameters are consistent with abnormal left ventricular    relaxation (grade 1 diastolic dysfunction).  Past Medical History:  Diagnosis Date   Acute kidney injury (HCC) 05/26/2018   Allergy    Anemia    Anxiety    Arthritis    LEGS,BACK   Cataract    BILATERAL   CKD (chronic kidney disease), stage III (HCC)    Depression    History of kidney stones    Hyperlipidemia    Hypertension, essential, benign 08/02/2012   Orthostatic hypotension 10/09/2013   OSA (obstructive sleep apnea) 10/01/2015   Severe OSA with an AHI of 86/hr now on BiPAP at 23/19cm H2O   Pneumonia    Rhabdomyolysis 08/24/2022   Seizures (HCC)    none for last 6-8months   Sleep apnea    Substance abuse (HCC)     Past Surgical History:  Procedure Laterality Date   COLONOSCOPY     CRANIOTOMY Left 06/08/2022   Procedure: CRANIOTOMY HEMATOMA EVACUATION SUBDURAL;  Surgeon: Julio Sicks, MD;  Location: [facility];  Service: Neurosurgery;  Laterality: Left;   FRACTURE SURGERY  1990   right hip and femur   HERNIA REPAIR  07/2009   umbilical hernia/ got infected   JOINT REPLACEMENT Right 1990   right hip   TRACHEOSTOMY  08/1999  Had pneumoniaand was in a coma 10 days   WISDOM TOOTH EXTRACTION      MEDICATIONS:  acetaminophen (TYLENOL) 325 MG tablet   aspirin 81 MG EC tablet   atorvastatin (LIPITOR) 20 MG tablet   benztropine (COGENTIN) 1 MG tablet   Bismuth Subsalicylate (KAOPECTATE) 262 MG TABS   bismuth subsalicylate (PEPTO BISMOL) 262 MG/15ML suspension   Boswellia-Glucosamine-Vit D (OSTEO BI-FLEX ONE PER DAY PO)   busPIRone (BUSPAR) 5 MG tablet   cetirizine (ZYRTEC) 10 MG tablet   citalopram (CELEXA) 20 MG tablet   Ensure (ENSURE)   finasteride (PROSCAR) 5 MG tablet   levETIRAcetam (KEPPRA) 750 MG tablet   midodrine (PROAMATINE) 5 MG tablet   Multiple Vitamin  (MULTIVITAMIN) tablet   potassium chloride (KLOR-CON M) 10 MEQ tablet   pregabalin (LYRICA) 75 MG capsule   risperiDONE (RISPERDAL) 2 MG tablet   tamsulosin (FLOMAX) 0.4 MG CAPS capsule   thiamine 250 MG tablet   traZODone (DESYREL) 100 MG tablet   No current facility-administered medications for this encounter.     Jodell Cipro Ward, PA-C [facility] [phone]

## 2023-03-24 ENCOUNTER — Ambulatory Visit: Payer: 59 | Attending: Internal Medicine | Admitting: Internal Medicine

## 2023-03-24 ENCOUNTER — Encounter: Payer: Self-pay | Admitting: Internal Medicine

## 2023-03-24 VITALS — BP 124/66 | HR 66 | Ht 72.0 in | Wt 175.2 lb

## 2023-03-24 DIAGNOSIS — R42 Dizziness and giddiness: Secondary | ICD-10-CM | POA: Diagnosis not present

## 2023-03-28 ENCOUNTER — Encounter: Payer: Self-pay | Admitting: Urology

## 2023-03-28 ENCOUNTER — Ambulatory Visit (HOSPITAL_COMMUNITY): Payer: 59

## 2023-03-28 ENCOUNTER — Ambulatory Visit (HOSPITAL_BASED_OUTPATIENT_CLINIC_OR_DEPARTMENT_OTHER)
Admission: RE | Admit: 2023-03-28 | Discharge: 2023-03-28 | Disposition: A | Discharge status: Home / Self Care | Payer: 59 | Source: Ambulatory Visit | Attending: Urology | Admitting: Urology

## 2023-03-28 ENCOUNTER — Encounter (HOSPITAL_BASED_OUTPATIENT_CLINIC_OR_DEPARTMENT_OTHER): Payer: Self-pay | Admitting: Urology

## 2023-03-28 ENCOUNTER — Encounter (HOSPITAL_BASED_OUTPATIENT_CLINIC_OR_DEPARTMENT_OTHER)
Admission: RE | Disposition: A | Discharge status: Home / Self Care | Payer: Self-pay | Source: Ambulatory Visit | Attending: Urology

## 2023-03-28 DIAGNOSIS — N201 Calculus of ureter: Secondary | ICD-10-CM

## 2023-03-28 DIAGNOSIS — N200 Calculus of kidney: Secondary | ICD-10-CM | POA: Diagnosis not present

## 2023-03-28 DIAGNOSIS — Z7982 Long term (current) use of aspirin: Secondary | ICD-10-CM | POA: Insufficient documentation

## 2023-03-28 DIAGNOSIS — Z538 Procedure and treatment not carried out for other reasons: Secondary | ICD-10-CM | POA: Insufficient documentation

## 2023-03-28 HISTORY — PX: EXTRACORPOREAL SHOCK WAVE LITHOTRIPSY: SHX1557

## 2023-03-28 SURGERY — LITHOTRIPSY, ESWL
Anesthesia: LOCAL | Laterality: Right

## 2023-03-28 MED ORDER — DIAZEPAM 5 MG PO TABS
10.0000 mg | ORAL_TABLET | ORAL | Status: AC
  Administered 2023-03-28: 10 mg via ORAL

## 2023-03-28 MED ORDER — LEVOFLOXACIN IN D5W 500 MG/100ML IV SOLN
500.0000 mg | INTRAVENOUS | Status: AC
  Administered 2023-03-28: 500 mg via INTRAVENOUS

## 2023-03-28 MED ORDER — DIPHENHYDRAMINE HCL 25 MG PO CAPS
25.0000 mg | ORAL_CAPSULE | ORAL | Status: AC
  Administered 2023-03-28: 25 mg via ORAL

## 2023-03-28 MED ORDER — SODIUM CHLORIDE 0.9 % IV SOLN: INTRAVENOUS | Status: DC

## 2023-03-28 MED ORDER — DIPHENHYDRAMINE HCL 25 MG PO CAPS
ORAL_CAPSULE | ORAL | Status: AC
  Filled 2023-03-28: qty 1

## 2023-03-28 MED ORDER — LEVOFLOXACIN IN D5W 500 MG/100ML IV SOLN
INTRAVENOUS | Status: AC
  Filled 2023-03-28: qty 100

## 2023-03-28 MED ORDER — DIAZEPAM 5 MG PO TABS
ORAL_TABLET | ORAL | Status: AC
  Filled 2023-03-28: qty 2

## 2023-03-28 NOTE — Interval H&P Note (Signed)
History and Physical Interval Note:  03/28/2023 12:37 PM  Jose Li  has presented today for surgery, with the diagnosis of Right ureteral stone.  The various methods of treatment have been discussed with the patient and family. After consideration of risks, benefits and other options for treatment, the patient has consented to  Procedure(s) with comments: EXTRACORPOREAL SHOCK WAVE LITHOTRIPSY (ESWL) (Right) - Local anesthesia as a surgical intervention.  The patient's history has been reviewed, patient examined, no change in status, stable for surgery.  I have reviewed the patient's chart and labs.  Questions were answered to the patient's satisfaction.     [name]

## 2023-03-28 NOTE — Discharge Instructions (Signed)
Follow preop instructions given for lithotripsy prep. No aspirin, pepto bismol, kaopectate, vitamins/supplements until notified for next procedure.

## 2023-03-28 NOTE — Anesthesia Preprocedure Evaluation (Signed)
Anesthesia Evaluation  Patient identified by MRN, date of birth, ID band Patient awake    Reviewed: Allergy & Precautions, NPO status , Patient's Chart, lab work & pertinent test results  History of Anesthesia Complications Negative for: history of anesthetic complications  Airway Mallampati: III  TM Distance: >3 FB Neck ROM: Full    Dental  (+) Edentulous Upper, Edentulous Lower, Dental Advisory Given   Pulmonary sleep apnea , Current Smoker and Patient abstained from smoking.    + decreased breath sounds      Cardiovascular hypertension, Pt. on medications  Rhythm:Regular  Left ventricle: The cavity size was normal. Systolic function was    normal. The estimated ejection fraction was in the range of 60%    to 65%. Wall motion was normal; there were no regional wall    motion abnormalities. There was an increased relative    contribution of atrial contraction to ventricular filling.    Doppler parameters are consistent with abnormal left ventricular    relaxation (grade 1 diastolic dysfunction).     Neuro/Psych Seizures -,  PSYCHIATRIC DISORDERS Anxiety Depression Bipolar Disorder    Neuromuscular disease    GI/Hepatic negative GI ROS, Neg liver ROS,,,  Endo/Other  negative endocrine ROS    Renal/GU Renal diseaseLab Results      Component                Value               Date                      CREATININE               1.23                03/22/2023            Lab Results      Component                Value               Date                      K                        4.1                 03/22/2023                Musculoskeletal  (+) Arthritis ,    Abdominal   Peds  Hematology  (+) Blood dyscrasia, anemia Lab Results      Component                Value               Date                      WBC                      6.2                 03/22/2023                HGB                      12.0 (L)  03/22/2023                HCT                      37.2 (L)            03/22/2023                MCV                      87.5                03/22/2023                PLT                      266                 03/22/2023              Anesthesia Other Findings   Reproductive/Obstetrics                              Anesthesia Physical Anesthesia Plan  ASA: 3  Anesthesia Plan: General   Post-op Pain Management:    Induction: Intravenous  PONV Risk Score and Plan: 1 and Ondansetron and Dexamethasone  Airway Management Planned: LMA  Additional Equipment: None  Intra-op Plan:   Post-operative Plan: Extubation in OR  Informed Consent: I have reviewed the patients History and Physical, chart, labs and discussed the procedure including the risks, benefits and alternatives for the proposed anesthesia with the patient or authorized representative who has indicated his/her understanding and acceptance.     Dental advisory given  Plan Discussed with: CRNA  Anesthesia Plan Comments: (See PAT note 03/22/2023)        Anesthesia Quick Evaluation

## 2023-03-29 ENCOUNTER — Encounter (HOSPITAL_COMMUNITY)
Admission: RE | Disposition: A | Discharge status: Home / Self Care | Payer: Self-pay | Source: Ambulatory Visit | Attending: Urology

## 2023-03-29 ENCOUNTER — Ambulatory Visit (HOSPITAL_COMMUNITY): Payer: 59

## 2023-03-29 ENCOUNTER — Ambulatory Visit (HOSPITAL_BASED_OUTPATIENT_CLINIC_OR_DEPARTMENT_OTHER): Payer: 59 | Admitting: Anesthesiology

## 2023-03-29 ENCOUNTER — Ambulatory Visit (HOSPITAL_COMMUNITY)
Admission: RE | Admit: 2023-03-29 | Discharge: 2023-03-29 | Disposition: A | Discharge status: Home / Self Care | Payer: 59 | Source: Ambulatory Visit | Attending: Urology | Admitting: Urology

## 2023-03-29 ENCOUNTER — Encounter (HOSPITAL_COMMUNITY): Payer: Self-pay | Admitting: Urology

## 2023-03-29 ENCOUNTER — Ambulatory Visit (HOSPITAL_COMMUNITY): Payer: 59 | Admitting: Physician Assistant

## 2023-03-29 ENCOUNTER — Other Ambulatory Visit: Payer: Self-pay

## 2023-03-29 DIAGNOSIS — F17210 Nicotine dependence, cigarettes, uncomplicated: Secondary | ICD-10-CM

## 2023-03-29 DIAGNOSIS — N400 Enlarged prostate without lower urinary tract symptoms: Secondary | ICD-10-CM | POA: Diagnosis not present

## 2023-03-29 DIAGNOSIS — F418 Other specified anxiety disorders: Secondary | ICD-10-CM

## 2023-03-29 DIAGNOSIS — G4733 Obstructive sleep apnea (adult) (pediatric): Secondary | ICD-10-CM | POA: Diagnosis not present

## 2023-03-29 DIAGNOSIS — R3129 Other microscopic hematuria: Secondary | ICD-10-CM | POA: Insufficient documentation

## 2023-03-29 DIAGNOSIS — F17200 Nicotine dependence, unspecified, uncomplicated: Secondary | ICD-10-CM | POA: Diagnosis not present

## 2023-03-29 DIAGNOSIS — I10 Essential (primary) hypertension: Secondary | ICD-10-CM | POA: Diagnosis not present

## 2023-03-29 DIAGNOSIS — N201 Calculus of ureter: Secondary | ICD-10-CM

## 2023-03-29 DIAGNOSIS — N200 Calculus of kidney: Secondary | ICD-10-CM

## 2023-03-29 HISTORY — PX: CYSTOSCOPY WITH RETROGRADE PYELOGRAM, URETEROSCOPY AND STENT PLACEMENT: SHX5789

## 2023-03-29 SURGERY — CYSTOURETEROSCOPY, WITH RETROGRADE PYELOGRAM AND STENT INSERTION
Anesthesia: General | Laterality: Right

## 2023-03-29 MED ORDER — ACETAMINOPHEN 160 MG/5ML PO SOLN: 1000.0000 mg | Freq: Once | ORAL | Status: DC | PRN

## 2023-03-29 MED ORDER — MIDAZOLAM HCL 2 MG/2ML IJ SOLN
INTRAMUSCULAR | Status: AC
  Filled 2023-03-29: qty 2

## 2023-03-29 MED ORDER — IOHEXOL 300 MG/ML  SOLN
INTRAMUSCULAR | Status: DC | PRN
  Administered 2023-03-29: 7 mL via URETHRAL

## 2023-03-29 MED ORDER — DEXAMETHASONE SODIUM PHOSPHATE 10 MG/ML IJ SOLN
INTRAMUSCULAR | Status: DC | PRN
  Administered 2023-03-29: 8 mg via INTRAVENOUS

## 2023-03-29 MED ORDER — GLYCOPYRROLATE 0.2 MG/ML IJ SOLN
INTRAMUSCULAR | Status: DC | PRN
  Administered 2023-03-29: .2 mg via INTRAVENOUS

## 2023-03-29 MED ORDER — SODIUM CHLORIDE 0.9 % IV SOLN
1.0000 g | INTRAVENOUS | Status: AC
  Administered 2023-03-29: 2 g via INTRAVENOUS
  Filled 2023-03-29: qty 10

## 2023-03-29 MED ORDER — ONDANSETRON HCL 4 MG/2ML IJ SOLN
INTRAMUSCULAR | Status: AC
  Filled 2023-03-29: qty 2

## 2023-03-29 MED ORDER — 0.9 % SODIUM CHLORIDE (POUR BTL) OPTIME
TOPICAL | Status: DC | PRN
  Administered 2023-03-29: 1000 mL

## 2023-03-29 MED ORDER — KETOROLAC TROMETHAMINE 15 MG/ML IJ SOLN
INTRAMUSCULAR | Status: DC | PRN
  Administered 2023-03-29: 15 mg via INTRAVENOUS

## 2023-03-29 MED ORDER — ONDANSETRON HCL 4 MG/2ML IJ SOLN
INTRAMUSCULAR | Status: DC | PRN
  Administered 2023-03-29: 4 mg via INTRAVENOUS

## 2023-03-29 MED ORDER — CHLORHEXIDINE GLUCONATE 0.12 % MT SOLN
15.0000 mL | Freq: Once | OROMUCOSAL | Status: AC
  Administered 2023-03-29: 15 mL via OROMUCOSAL

## 2023-03-29 MED ORDER — FENTANYL CITRATE (PF) 250 MCG/5ML IJ SOLN
INTRAMUSCULAR | Status: DC | PRN
  Administered 2023-03-29 (×2): 50 ug via INTRAVENOUS

## 2023-03-29 MED ORDER — FENTANYL CITRATE PF 50 MCG/ML IJ SOSY
25.0000 ug | PREFILLED_SYRINGE | INTRAMUSCULAR | Status: DC | PRN
  Administered 2023-03-29: 50 ug via INTRAVENOUS

## 2023-03-29 MED ORDER — DEXAMETHASONE SODIUM PHOSPHATE 10 MG/ML IJ SOLN
INTRAMUSCULAR | Status: AC
  Filled 2023-03-29: qty 1

## 2023-03-29 MED ORDER — FENTANYL CITRATE PF 50 MCG/ML IJ SOSY
PREFILLED_SYRINGE | INTRAMUSCULAR | Status: AC
  Filled 2023-03-29: qty 1

## 2023-03-29 MED ORDER — ACETAMINOPHEN 10 MG/ML IV SOLN
1000.0000 mg | Freq: Once | INTRAVENOUS | Status: DC | PRN
  Administered 2023-03-29: 1000 mg via INTRAVENOUS

## 2023-03-29 MED ORDER — LIDOCAINE HCL URETHRAL/MUCOSAL 2 % EX GEL
CUTANEOUS | Status: DC | PRN
  Administered 2023-03-29: 1 via TOPICAL

## 2023-03-29 MED ORDER — SODIUM CHLORIDE 0.9 % IR SOLN
Status: DC | PRN
  Administered 2023-03-29: 3000 mL via INTRAVESICAL

## 2023-03-29 MED ORDER — ACETAMINOPHEN 500 MG PO TABS: 1000.0000 mg | ORAL_TABLET | Freq: Once | ORAL | Status: DC | PRN

## 2023-03-29 MED ORDER — ORAL CARE MOUTH RINSE: 15.0000 mL | Freq: Once | OROMUCOSAL | Status: AC

## 2023-03-29 MED ORDER — ACETAMINOPHEN 10 MG/ML IV SOLN
INTRAVENOUS | Status: AC
  Filled 2023-03-29: qty 100

## 2023-03-29 MED ORDER — PHENYLEPHRINE HCL-NACL 20-0.9 MG/250ML-% IV SOLN
INTRAVENOUS | Status: DC | PRN
  Administered 2023-03-29: 50 ug/min via INTRAVENOUS

## 2023-03-29 MED ORDER — PROPOFOL 10 MG/ML IV BOLUS
INTRAVENOUS | Status: DC | PRN
  Administered 2023-03-29: 20 mg via INTRAVENOUS
  Administered 2023-03-29: 160 mg via INTRAVENOUS

## 2023-03-29 MED ORDER — LIDOCAINE HCL URETHRAL/MUCOSAL 2 % EX GEL
CUTANEOUS | Status: AC
  Filled 2023-03-29: qty 30

## 2023-03-29 MED ORDER — FENTANYL CITRATE (PF) 100 MCG/2ML IJ SOLN
INTRAMUSCULAR | Status: AC
  Filled 2023-03-29: qty 2

## 2023-03-29 MED ORDER — LACTATED RINGERS IV SOLN: INTRAVENOUS | Status: DC

## 2023-03-29 SURGICAL SUPPLY — 18 items
BAG URO CATCHER STRL LF (MISCELLANEOUS) ×1 IMPLANT
CATH URETERAL DUAL LUMEN 10F (MISCELLANEOUS) IMPLANT
CATH URETL OPEN END 6FR 70 (CATHETERS) IMPLANT
CLOTH BEACON ORANGE TIMEOUT ST (SAFETY) ×1 IMPLANT
FIBER LASER MOSES 200 DFL (Laser) IMPLANT
GLOVE SURG LX STRL 7.5 STRW (GLOVE) ×1 IMPLANT
GOWN STRL REUS W/ TWL XL LVL3 (GOWN DISPOSABLE) ×1 IMPLANT
GOWN STRL REUS W/TWL XL LVL3 (GOWN DISPOSABLE) ×1
GUIDEWIRE AMPLAZ .035X145 (WIRE) IMPLANT
GUIDEWIRE STR DUAL SENSOR (WIRE) ×1 IMPLANT
GUIDEWIRE ZIPWRE .038 STRAIGHT (WIRE) IMPLANT
KIT TURNOVER KIT A (KITS) IMPLANT
MANIFOLD NEPTUNE II (INSTRUMENTS) ×1 IMPLANT
PACK CYSTO (CUSTOM PROCEDURE TRAY) ×1 IMPLANT
SHEATH NAVIGATOR HD 11/13X36 (SHEATH) IMPLANT
STENT URET 6FRX28 CONTOUR (STENTS) IMPLANT
TUBING CONNECTING 10 (TUBING) ×1 IMPLANT
TUBING UROLOGY SET (TUBING) IMPLANT

## 2023-03-29 NOTE — Op Note (Signed)
OPERATIVE NOTE   Patient Name: Jose Li  MRN: [id number]   Date of Procedure: .today   Preoperative diagnosis:  Right proximal ureteral stone  Postoperative diagnosis:  same  Procedure:  Cystoscopy, right retrograde pyelogram, right ureteroscopy, laser lithotripsy and placement of ureteral stent  Attending: Concepcion Living  Anesthesia: general  Estimated blood loss: minimal  Fluids: Per anesthesia record  Drains: 6x28 dj stent with short tether  Specimens: none  Antibiotics: rocephin  Findings: impacted right proximal stone with marked inflammatory changes, small area of mucosal tear at stone location  Indications:  Right proximal ureteral stone and flank pain  Description of Procedure:  Patient was brought to the operating room where he was correctly identified by providing his full name and date of birth.  He was then positioned supine on the operating room table and underwent induction of general anesthesia.  Subsequently he was repositioned in the modified dorsolithotomy position and then his external genitalia were prepped and draped in usual sterile fashion.  Preprocedural timeout was performed. Cystoscopy was then performed with a 23 [place] panendoscope.  The anterior urethra was normal.  The prostatic urethra revealed some mild lateral lobe enlargement and a moderately elevated bladder neck.  The bladder was then entered and carefully inspected.  There were no mucosal lesions.  The ureteral openings appeared normal.  A right retrograde ureteropyelogram was subsequently performed with a 5 French endhole catheter.  This revealed a normal distal and mid ureter.  At the proximal ureter at the level of L2 there is noted to be a filling defect compatible with stone.  There was moderate proximal dilation.  At this time I tried to place a sensor wire.  Under endoscopic and fluoroscopic control there was initially some difficulty in getting it to go beyond the wire  but with continued manipulation I was able to thread it into the renal collecting system.  At this time, I used a 11/13 [place] ureteral access sheath and was able to easily pass first the inner and then subsequently the combined access sheath up to the level of the proximal ureter without difficulty.  The access sheath was then removed.  I placed a dual-lumen catheter at this time up through the existing wire and placed a second safety wire which was a Super Stiff wire.  Over the Super Stiff wire I then replaced the access sheath and removed the inner portion along with the Super Stiff wire.  At this time I deployed the flexible ureteroscope.  I was able to pass it through the access sheath into the proximal ureter where the stone was identified.  There was noted to be a small mucosal tear at the level of the stone on the medial aspect.  At this time I deployed the [facility] laser initially using the lithotripsy setting and then finally the dusting setting was and was able to break the stone up into multiple tiny fragments.  There were no residual significant fragments.  At this time I visually inspected the entire ureter with the ureteroscope pulling it out along with the access sheath.  There were no other ureteral abnormalities.  Over the remaining safety wire under endoscopic and fluoroscopic control I then placed a 28 x 6 cm double-J stent with a small tether left attached.  Good position was obtained.  The bladder was drained.  The patient was administered 2% lidocaine jelly per urethra and the procedure was terminated.  The patient tolerated the procedure well.  There were  no apparent complications.  Patient was subsequently taken to recovery room in satisfactory condition.  Complications: small mucosal tear at impaction site  Condition: Stable, extubated, transferred to PACU  Plan: DJ stent for 2 weeks

## 2023-03-29 NOTE — Transfer of Care (Signed)
Immediate Anesthesia Transfer of Care Note  Patient: Jose Li  Procedure(s) Performed: CYSTOSCOPY WITH RETROGRADE PYELOGRAM, URETEROSCOPY AND STENT PLACEMENT (Right)  Patient Location: PACU  Anesthesia Type:General  Level of Consciousness: awake, alert , oriented, and patient cooperative  Airway & Oxygen Therapy: Patient Spontanous Breathing and Patient connected to face mask oxygen  Post-op Assessment: Report given to RN and Post -op Vital signs reviewed and stable  Post vital signs: Reviewed and stable  Last Vitals:  Vitals Value Taken Time  BP 137/70 03/29/23 1617  Temp    Pulse 72 03/29/23 1620  Resp 18 03/29/23 1620  SpO2 100 % 03/29/23 1620  Vitals shown include unvalidated device data.  Last Pain:  Vitals:   03/29/23 1326  TempSrc:   PainSc: 7          Complications: No notable events documented.

## 2023-03-29 NOTE — Telephone Encounter (Signed)
Scat form completed by provider and scanned to Marylu Lund C by [employer].  Calden Dorsey,CMA

## 2023-03-29 NOTE — Interval H&P Note (Signed)
History and Physical Interval Note:  03/29/2023 2:14 PM  Jose Li  has presented today for surgery, with the diagnosis of right proximal ureteral stone.  The various methods of treatment have been discussed with the patient and family. After consideration of risks, benefits and other options for treatment, the patient has consented to  Procedure(s): CYSTOSCOPY WITH RETROGRADE PYELOGRAM, URETEROSCOPY AND STENT PLACEMENT (Right) as a surgical intervention.  The patient's history has been reviewed, patient examined, no change in status, stable for surgery.  I have reviewed the patient's chart and labs.  Questions were answered to the patient's satisfaction.     [name]

## 2023-03-30 ENCOUNTER — Encounter (HOSPITAL_BASED_OUTPATIENT_CLINIC_OR_DEPARTMENT_OTHER): Payer: Self-pay | Admitting: Urology

## 2023-03-30 ENCOUNTER — Ambulatory Visit: Payer: Self-pay | Admitting: *Deleted

## 2023-03-30 NOTE — Patient Instructions (Signed)
Visit Information  Thank you for taking time to visit with me today. Please don't hesitate to contact me if I can be of assistance to you.   Following are the goals we discussed today:   Goals Addressed             This Visit's Progress    Seek opportunities to enhance quality of life       Care Coordination Interventions: Pt going for "kidney stone removal 03/29/23 Solution-Focused Strategies employed:  Active listening / Reflection utilized  Problem Solving /Task Center strategies reviewed Provided general psycho-education for mental health needs  Reviewed mental health medications and discussed importance of compliance:  Participation in counseling encouraged : pt declines at this time Participation in support group encouraged : declines at this time Consider volunteer opportunities   Transportation provided by insurance provider ( )  [employer] transportation (PCP has completed application/portion and will submit with your portion to [facility]) Solution-Focused Strategies employed:  Problem Solving /Task Center strategies reviewed Continue to remain alcohol free- great job         Our next appointment is by telephone on 04/22/23    Please call the care guide team at [phone] if you need to cancel or reschedule your appointment.   If you are experiencing a Mental Health or Behavioral Health Crisis or need someone to talk to, please call the Suicide and Crisis Lifeline: 988 call 911   The patient verbalized understanding of instructions, educational materials, and care plan provided today and DECLINED offer to receive copy of patient instructions, educational materials, and care plan.   Telephone follow up appointment with care management team member scheduled for:04/22/23  Reece Levy, MSW, LCSW Clinical Social Worker [employer] [phone]

## 2023-03-30 NOTE — Patient Outreach (Signed)
  Care Coordination   Follow Up Visit Note Late Entry  03/30/2023 Name: XXAVIER Li MRN: [id number] DOB: 1956/01/19  Jose Li is a 67 y.o. year old male who sees Bess Kinds, MD for primary care. I spoke with  Jose Li by phone today.  What matters to the patients health and wellness today?  Going to have kidney stone removed today.    Goals Addressed             This Visit's Progress    Seek opportunities to enhance quality of life       Care Coordination Interventions: Pt going for "kidney stone removal 03/29/23 Solution-Focused Strategies employed:  Active listening / Reflection utilized  Problem Solving /Task Center strategies reviewed Provided general psycho-education for mental health needs  Reviewed mental health medications and discussed importance of compliance:  Participation in counseling encouraged : pt declines at this time Participation in support group encouraged : declines at this time Consider volunteer opportunities   Transportation provided by insurance provider ( )  [employer]AT transportation (PCP has completed application/portion and will submit with your portion to [employer]) Solution-Focused Strategies employed:  Problem Solving /Task Center strategies reviewed Continue to remain alcohol free- great job         SDOH assessments and interventions completed:  Yes     Care Coordination Interventions:  Yes, provided  Interventions Today    Flowsheet Row Most Recent Value  Chronic Disease   Chronic disease during today's visit Hypertension (HTN)  General Interventions   General Interventions Discussed/Reviewed [employer] assisted pt with completing PART A of SCAT applicaition and will fax to [employer] for determination]  Mental Health Interventions   Mental Health Discussed/Reviewed Coping Strategies, Other  [Pt reports plans to go have kidney stone removed today- anxious and eager to resolve]       Follow up plan: Follow up call  scheduled for 04/22/23    Encounter Outcome:  Pt. Visit Completed

## 2023-04-01 ENCOUNTER — Telehealth: Payer: Self-pay | Admitting: Urology

## 2023-04-01 ENCOUNTER — Encounter: Payer: Self-pay | Admitting: Urology

## 2023-04-01 NOTE — Telephone Encounter (Signed)
EDIT: I also let patient know Dr Margo Aye and Christal are out of the office today.

## 2023-04-01 NOTE — Anesthesia Postprocedure Evaluation (Signed)
Anesthesia Post Note  Patient: OZIL WADLER  Procedure(s) Performed: CYSTOSCOPY WITH RETROGRADE PYELOGRAM, URETEROSCOPY AND STENT PLACEMENT (Right)     Patient location during evaluation: PACU Anesthesia Type: General Level of consciousness: awake and alert Pain management: pain level controlled Vital Signs Assessment: post-procedure vital signs reviewed and stable Respiratory status: spontaneous breathing, nonlabored ventilation and respiratory function stable Cardiovascular status: blood pressure returned to baseline and stable Postop Assessment: no apparent nausea or vomiting Anesthetic complications: no   No notable events documented.  Last Vitals:  Vitals:   03/29/23 1726 03/29/23 1730  BP: 131/62 135/73  Pulse: 61 66  Resp: 17   Temp:    SpO2: 96% 96%    Last Pain:  Vitals:   03/29/23 1655  TempSrc:   PainSc: 4                  Sheyla Zaffino

## 2023-04-01 NOTE — Telephone Encounter (Signed)
Patient called back in regards to message below, I let patient know what Dr Pete Glatter had said & recommended but let patient know the nurse should be calling him back soon regarding this.  Patients callback #: [phone]

## 2023-04-01 NOTE — Telephone Encounter (Signed)
Advised pt to push fluids per the message below. Pt states he is burning when he urinates. Advised pt to call back on Monday if he is still having trouble. Pt expressed understanding.

## 2023-04-01 NOTE — Telephone Encounter (Signed)
Patient called and left a voicemail that he is experiencing passing some blood after a procedure that Dr Margo Aye had done. He wanted a call back regarding this, routing to Crysta and Dr Pete Glatter as Valentino Hue and Dr Margo Aye are out of the office.  Patients callback #: [phone]

## 2023-04-06 ENCOUNTER — Encounter: Payer: 59 | Admitting: Urology

## 2023-04-07 ENCOUNTER — Telehealth: Payer: Self-pay | Admitting: Urology

## 2023-04-07 ENCOUNTER — Encounter: Payer: 59 | Admitting: *Deleted

## 2023-04-07 NOTE — Telephone Encounter (Signed)
Patient called and LVM that since his surgery he had quit bleeding but he just started back bleeding today and wondered if this was normal? His f/u appt is not until 5/30. Patient wants a call back regarding this.   Patients callback #: [phone]

## 2023-04-07 NOTE — Telephone Encounter (Signed)
Advised pt that as long as the stent is in place, he could periodically see some blood int he urine.

## 2023-04-12 ENCOUNTER — Ambulatory Visit: Payer: Self-pay

## 2023-04-12 NOTE — Patient Instructions (Signed)
Visit Information  Thank you for taking time to visit with me today. Please don't hesitate to contact me if I can be of assistance to you.   Following are the goals we discussed today:   Goals Addressed             This Visit's Progress    To manage and monitor his low blood pressure and falls       Patient Goals/Self Care Activities: -Patient/Caregiver will self-administer medications as prescribed as evidenced by self-report/primary caregiver report  -Patient/Caregiver will attend all scheduled provider appointments as evidenced by clinician review of documented attendance to scheduled appointments and patient/caregiver report -Patient/Caregiver will call pharmacy for medication refills as evidenced by patient report and review of pharmacy fill history as appropriate -Patient/Caregiver will call provider office for new concerns or questions as evidenced by review of documented incoming telephone call notes and patient report -Patient/Caregiver verbalizes understanding of plan -Patient/Caregiver will focus on medication adherence by taking medications as prescribed  -Calls provider office for new concerns, questions, or BP outside discussed parameters -Checks BP and records as discussed --continue to stand still to get his bearing before walking and use a cane if needed -follow his learned exercises from physical therapy to maintain what he has learned and built strength -Try to eat on a schedule to prevent him from feeling dizzy         Our next appointment is by telephone on 05/13/23 at 10 am  Please call the care guide team at [phone] if you need to cancel or reschedule your appointment.   If you are experiencing a Mental Health or Behavioral Health Crisis or need someone to talk to, please call 1-800-273-TALK (toll free, 24 hour hotline)  Patient verbalizes understanding of instructions and care plan provided today and agrees to view in MyChart. Active MyChart status and  patient understanding of how to access instructions and care plan via MyChart confirmed with patient.     Juanell Fairly RN, BSN, [facility] Care Coordinator [facility]   Phone: [phone]

## 2023-04-12 NOTE — Patient Outreach (Signed)
  Care Coordination   Follow Up Visit Note   04/12/2023 Name: Jose Li MRN: [id number] DOB: 19-Oct-1956  Jose Li is a 67 y.o. year old male who sees Jose Kinds, MD for primary care. I spoke with  Jose Li by phone today.  What matters to the patients health and wellness today?  Jose Li is feeling good, but he had a kidney stone that was removed and they put a stent in. They will take it out on Thursday. The physician started him on two medication Finasteride 5  mg daily and fluconazole  for three days. His blood pressure has been steady, and he has no problems with headaches or chest pain. He felt a little dizzy a couple of times because he didn't eat when he should have. I suggested he eat on a schedule. He also mentioned that in June, he has an appointment at [facility], and in July, he has an appointment with an ophthalmologist.    Goals Addressed             This Visit's Progress    To manage and monitor his low blood pressure and falls       Patient Goals/Self Care Activities: -Patient/Caregiver will self-administer medications as prescribed as evidenced by self-report/primary caregiver report  -Patient/Caregiver will attend all scheduled provider appointments as evidenced by clinician review of documented attendance to scheduled appointments and patient/caregiver report -Patient/Caregiver will call pharmacy for medication refills as evidenced by patient report and review of pharmacy fill history as appropriate -Patient/Caregiver will call provider office for new concerns or questions as evidenced by review of documented incoming telephone call notes and patient report -Patient/Caregiver verbalizes understanding of plan -Patient/Caregiver will focus on medication adherence by taking medications as prescribed  -Calls provider office for new concerns, questions, or BP outside discussed parameters -Checks BP and records as discussed --continue to stand still to get his  bearing before walking and use a cane if needed -follow his learned exercises from physical therapy to maintain what he has learned and built strength -Try to eat on a schedule to prevent him from feeling dizzy         SDOH assessments and interventions completed:  No     Care Coordination Interventions:  Yes, provided   Interventions Today    Flowsheet Row Most Recent Value  Chronic Disease   Chronic disease during today's visit Hypertension (HTN)  General Interventions   General Interventions Discussed/Reviewed General Interventions Discussed  Mental Health Interventions   Mental Health Discussed/Reviewed Mental Health Discussed  Nutrition Interventions   Nutrition Discussed/Reviewed Nutrition Discussed  Pharmacy Interventions   Pharmacy Dicussed/Reviewed Pharmacy Topics Discussed  Safety Interventions   Safety Discussed/Reviewed Safety Discussed, Fall Risk        Follow up plan: Follow up call scheduled for 05/13/23 10am    Encounter Outcome:  Pt. Visit Completed    Juanell Fairly RN, BSN, [facility] Care Coordinator [facility]   Phone: [phone]

## 2023-04-14 ENCOUNTER — Encounter: Payer: Self-pay | Admitting: Urology

## 2023-04-14 ENCOUNTER — Ambulatory Visit (INDEPENDENT_AMBULATORY_CARE_PROVIDER_SITE_OTHER): Payer: 59 | Admitting: Urology

## 2023-04-14 VITALS — BP 112/65 | HR 87

## 2023-04-14 DIAGNOSIS — Z466 Encounter for fitting and adjustment of urinary device: Secondary | ICD-10-CM | POA: Diagnosis not present

## 2023-04-14 DIAGNOSIS — Z2989 Encounter for other specified prophylactic measures: Secondary | ICD-10-CM | POA: Diagnosis not present

## 2023-04-14 DIAGNOSIS — N200 Calculus of kidney: Secondary | ICD-10-CM | POA: Diagnosis not present

## 2023-04-14 LAB — URINALYSIS, ROUTINE W REFLEX MICROSCOPIC
Bilirubin, UA: NEGATIVE
Glucose, UA: NEGATIVE
Nitrite, UA: POSITIVE — AB
Specific Gravity, UA: 1.02 (ref 1.005–1.030)
Urobilinogen, Ur: 4 mg/dL — ABNORMAL HIGH (ref 0.2–1.0)
pH, UA: 5 (ref 5.0–7.5)

## 2023-04-14 LAB — MICROSCOPIC EXAMINATION
Crystal Type: NONE SEEN
Crystals: NONE SEEN
RBC, Urine: >30 /hpf — AB (ref 0–2)
Trichomonas, UA: NONE SEEN
Yeast, UA: NONE SEEN

## 2023-04-14 MED ORDER — CEFDINIR 300 MG PO CAPS: 300.0000 mg | ORAL_CAPSULE | Freq: Two times a day (BID) | ORAL | 0 refills | Status: DC

## 2023-04-14 MED ORDER — CEFTRIAXONE SODIUM 1 G IJ SOLR
1.0000 g | INTRAMUSCULAR | Status: DC
Start: 2023-04-14 — End: 2023-05-03
  Administered 2023-04-14: 1 g via INTRAMUSCULAR

## 2023-04-14 NOTE — H&P (Deleted)
  The note originally documented on this encounter has been moved the the encounter in which it belongs.  

## 2023-04-14 NOTE — H&P (Signed)
UROLOGY NOTE: PATIENT WAS SCHEDULED FOR ESL TODAY. PROCEDURE WAS CANCELLED BECAUSE THE PATIENT HAD NOT DISCONTINUED HIS ASPIRIN FOR THE APPROPRIATE TIME PERIOD AND THE STONE WAS FELT TO BE IN THE PATH OF THE RENAL SHADOW.

## 2023-04-14 NOTE — Progress Notes (Signed)
Assessment: 1. Nephrolithiasis     Plan: Double-J stent removed today Urine for culture today.  Will treat empirically with Rocephin 1 g IM followed by Omnicef 300 twice daily for 5 days.  Patient will return in 6 weeks with renal ultrasound prior to visit  Chief Complaint: Chief Complaint  Patient presents with   Cysto Stent Removal    HPI: Jose Li is a 67 y.o. male who presents for continued evaluation of nephrolithiasis. Patient is status post cystoscopy with ureteroscopy and laser lithotripsy of a proximal right ureteral calculus.  This was performed on 03/29/2023.  The patient was actually scheduled for ESL the day prior but had not stopped his aspirin for the proper duration of time prior to the procedure and he was canceled. At the time of the procedure the stone was found to be impacted and there was noted to be a small mucosal defect after lasering and removing the stone fragments.  This would be expected to heal up with stent placement. Patient is here today for planned cystoscopy and stent removal.  He is actually doing quite well having mild to moderate stent irritation.  Portions of the above documentation were copied from a prior visit for review purposes only.  Allergies: Allergies  Allergen Reactions   Cymbalta [Duloxetine Hcl] Other (See Comments)    Hyponatremia    Erythromycin Other (See Comments)    Unknown reation   Hctz [Hydrochlorothiazide] Other (See Comments)    Syncope   Inderal [Propranolol] Other (See Comments)    Syncope     PMH: Past Medical History:  Diagnosis Date   Acute kidney injury (HCC) 05/26/2018   Allergy    Anemia    Anxiety    Arthritis    LEGS,BACK   Cataract    BILATERAL   CKD (chronic kidney disease), stage III (HCC)    Depression    History of kidney stones    Hyperlipidemia    Hypertension, essential, benign 08/02/2012   Orthostatic hypotension 10/09/2013   OSA (obstructive sleep apnea) 10/01/2015   Severe  OSA with an AHI of 86/hr now on BiPAP at 23/19cm H2O   Pneumonia    Rhabdomyolysis 08/24/2022   Seizures (HCC)    none for last 6-28months   Sleep apnea    Substance abuse (HCC)     PSH: Past Surgical History:  Procedure Laterality Date   COLONOSCOPY     CRANIOTOMY Left 06/08/2022   Procedure: CRANIOTOMY HEMATOMA EVACUATION SUBDURAL;  Surgeon: Julio Sicks, MD;  Location: [facility] OR;  Service: Neurosurgery;  Laterality: Left;   CYSTOSCOPY WITH RETROGRADE PYELOGRAM, URETEROSCOPY AND STENT PLACEMENT Right 03/29/2023   Procedure: CYSTOSCOPY WITH RETROGRADE PYELOGRAM, URETEROSCOPY AND STENT PLACEMENT;  Surgeon: [name], MD;  Location: [facility] OR[facility];  Service: Urology;  Laterality: Right;   EXTRACORPOREAL SHOCK WAVE LITHOTRIPSY Right 03/28/2023   Procedure: EXTRACORPOREAL SHOCK WAVE LITHOTRIPSY (ESWL);  Surgeon: [name], MD;  Location[facility];  Service: Urology;  Laterality: Right;  Local anesthesia   FRACTURE SURGERY  1990   right hip and femur   HERNIA REPAIR  07/2009   umbilical hernia/ got infected   JOINT REPLACEMENT Right 1990   right hip   TRACHEOSTOMY  08/1999   Had pneumoniaand was in a coma 10 days   WISDOM TOOTH EXTRACTION      SH: Social History   Tobacco Use   Smoking status: Every Day    Packs/day: 1.00    Years: 30.00  Additional pack years: 0.00    Total pack years: 30.00    Types: Cigarettes    Passive exposure: Current   Smokeless tobacco: Never  Vaping Use   Vaping Use: Never used  Substance Use Topics   Alcohol use: Not Currently    Comment: history of alcohol abuse.   Drug use: Not Currently    Comment: Hx of cocaine use "years ago"    ROS: Constitutional:  Negative for fever, chills, weight loss CV: Negative for chest pain, previous MI, hypertension Respiratory:  Negative for shortness of breath, wheezing, sleep apnea, frequent cough GI:  Negative for nausea, vomiting, bloody stool, GERD  PE: There were no vitals taken  for this visit. GENERAL APPEARANCE:  Well appearing, well developed, well nourished, NAD    Procedure: Cystoscopy with stent removal  Indication: Nephrolithiasis  Description of procedure: Patient was brought to the procedure room where he was correctly identified.  The procedure was reviewed with the patient and informed consent was obtained.  Preprocedural pause was performed.  Flexible cystoscopy was subsequently performed.  The ureteral stent was seen managing from the right ureteral opening.  The short tether was grasped with a flexible grasping forceps and the stent was removed intact without difficulty.  The procedure was well-tolerated.

## 2023-04-15 ENCOUNTER — Ambulatory Visit: Payer: Self-pay | Admitting: *Deleted

## 2023-04-15 LAB — URINE CULTURE

## 2023-04-15 NOTE — Addendum Note (Signed)
Addended by: Carolin Coy on: 04/15/2023 07:59 AM   Modules accepted: Orders

## 2023-04-18 ENCOUNTER — Other Ambulatory Visit: Payer: Self-pay

## 2023-04-18 DIAGNOSIS — N200 Calculus of kidney: Secondary | ICD-10-CM

## 2023-04-18 NOTE — Patient Instructions (Signed)
Visit Information  Thank you for taking time to visit with me today. Please don't hesitate to contact me if I can be of assistance to you.   Following are the goals we discussed today:   Goals Addressed             This Visit's Progress    Seek opportunities to enhance quality of life       Care Coordination Interventions: Pt relieved to have had kidney stone removal 03/29/23 Solution-Focused Strategies employed:  Active listening / Reflection utilized  Problem Solving /Task Center strategies reviewed Provided general psycho-education for mental health needs  Reviewed mental health medications and discussed importance of compliance:  Participation in counseling encouraged : pt declines at this time Participation in support group encouraged : declines at this time Consider volunteer opportunities   Transportation provided by insurance provider ( )  [employer]AT transportation (I have resent the completed application to [employer]) Solution-Focused Strategies employed:  Problem Solving /Task Center strategies reviewed Continue to remain alcohol free- great job         Our next appointment is by telephone on 05/06/23  Please call the care guide team at [phone] if you need to cancel or reschedule your appointment.   If you are experiencing a Mental Health or Behavioral Health Crisis or need someone to talk to, please call the Suicide and Crisis Lifeline: 988 call 911   The patient verbalized understanding of instructions, educational materials, and care plan provided today and DECLINED offer to receive copy of patient instructions, educational materials, and care plan.   Telephone follow up appointment with care management team member scheduled for:05/06/23  Reece Levy, MSW, LCSW Clinical Social Worker [facility] [phone]

## 2023-04-18 NOTE — Patient Outreach (Addendum)
  Care Coordination   Follow Up Visit Note  Late Entry for 04/15/23 04/18/2023 Name: Jose Li MRN: [id number] DOB: [birth year]  Jose Li is a 67 y.o. year old male who sees Bess Kinds, MD for primary care. I spoke with  Jose Li by phone today.  What matters to the patients health and wellness today?  Got rid of the kidney stone.    Goals Addressed             This Visit's Progress    Seek opportunities to enhance quality of life       Care Coordination Interventions: Pt relieved to have had kidney stone removal 03/29/23 Solution-Focused Strategies employed:  Active listening / Reflection utilized  Problem Solving /Task Center strategies reviewed Provided general psycho-education for mental health needs  Reviewed mental health medications and discussed importance of compliance:  Participation in counseling encouraged : pt declines at this time Participation in support group encouraged : declines at this time Consider volunteer opportunities   Transportation provided by insurance provider ( )  [employer] transportation (I have resent the completed application to [employer]) Solution-Focused Strategies employed:  Problem Solving /Task Center strategies reviewed Continue to remain alcohol free- great job         SDOH assessments and interventions completed:  Yes     Care Coordination Interventions:  Yes, provided  Interventions Today    Flowsheet Row Most Recent Value  Chronic Disease   Chronic disease during today's visit Hypertension (HTN)  General Interventions   General Interventions Discussed/Reviewed [employer] who did not receive faxed SCAT application. CSW has emailed it directly 04/15/23 to Jennetta]       Follow up plan: Follow up call scheduled for 05/06/23    Encounter Outcome:  Pt. Visit Completed

## 2023-05-03 ENCOUNTER — Ambulatory Visit (INDEPENDENT_AMBULATORY_CARE_PROVIDER_SITE_OTHER): Payer: 59

## 2023-05-03 DIAGNOSIS — Z0000 Encounter for general adult medical examination without abnormal findings: Secondary | ICD-10-CM | POA: Diagnosis not present

## 2023-05-03 NOTE — Patient Instructions (Signed)
Health Maintenance, Male Adopting a healthy lifestyle and getting preventive care are important in promoting health and wellness. Ask your health care provider about: The right schedule for you to have regular tests and exams. Things you can do on your own to prevent diseases and keep yourself healthy. What should I know about diet, weight, and exercise? Eat a healthy diet  Eat a diet that includes plenty of vegetables, fruits, low-fat dairy products, and lean protein. Do not eat a lot of foods that are high in solid fats, added sugars, or sodium. Maintain a healthy weight Body mass index (BMI) is a measurement that can be used to identify possible weight problems. It estimates body fat based on height and weight. Your health care provider can help determine your BMI and help you achieve or maintain a healthy weight. Get regular exercise Get regular exercise. This is one of the most important things you can do for your health. Most adults should: Exercise for at least 150 minutes each week. The exercise should increase your heart rate and make you sweat (moderate-intensity exercise). Do strengthening exercises at least twice a week. This is in addition to the moderate-intensity exercise. Spend less time sitting. Even light physical activity can be beneficial. Watch cholesterol and blood lipids Have your blood tested for lipids and cholesterol at 65 years of age, then have this test every 5 years. You may need to have your cholesterol levels checked more often if: Your lipid or cholesterol levels are high. You are older than 65 years of age. You are at high risk for heart disease. What should I know about cancer screening? Many types of cancers can be detected early and may often be prevented. Depending on your health history and family history, you may need to have cancer screening at various ages. This may include screening for: Colorectal cancer. Prostate cancer. Skin cancer. Lung  cancer. What should I know about heart disease, diabetes, and high blood pressure? Blood pressure and heart disease High blood pressure causes heart disease and increases the risk of stroke. This is more likely to develop in people who have high blood pressure readings or are overweight. Talk with your health care provider about your target blood pressure readings. Have your blood pressure checked: Every 3-5 years if you are 18-39 years of age. Every year if you are 40 years old or older. If you are between the ages of 65 and 75 and are a current or former smoker, ask your health care provider if you should have a one-time screening for abdominal aortic aneurysm (AAA). Diabetes Have regular diabetes screenings. This checks your fasting blood sugar level. Have the screening done: Once every three years after age 45 if you are at a normal weight and have a low risk for diabetes. More often and at a younger age if you are overweight or have a high risk for diabetes. What should I know about preventing infection? Hepatitis B If you have a higher risk for hepatitis B, you should be screened for this virus. Talk with your health care provider to find out if you are at risk for hepatitis B infection. Hepatitis C Blood testing is recommended for: Everyone born from 1945 through 1965. Anyone with known risk factors for hepatitis C. Sexually transmitted infections (STIs) You should be screened each year for STIs, including gonorrhea and chlamydia, if: You are sexually active and are younger than 65 years of age. You are older than 65 years of age and your   health care provider tells you that you are at risk for this type of infection. Your sexual activity has changed since you were last screened, and you are at increased risk for chlamydia or gonorrhea. Ask your health care provider if you are at risk. Ask your health care provider about whether you are at high risk for HIV. Your health care provider  may recommend a prescription medicine to help prevent HIV infection. If you choose to take medicine to prevent HIV, you should first get tested for HIV. You should then be tested every 3 months for as long as you are taking the medicine. Follow these instructions at home: Alcohol use Do not drink alcohol if your health care provider tells you not to drink. If you drink alcohol: Limit how much you have to 0-2 drinks a day. Know how much alcohol is in your drink. In the [place] one drink equals one 12 oz bottle of beer (355 mL), one 5 oz glass of wine (148 mL), or one 1 oz glass of hard liquor (44 mL). Lifestyle Do not use any products that contain nicotine or tobacco. These products include cigarettes, chewing tobacco, and vaping devices, such as e-cigarettes. If you need help quitting, ask your health care provider. Do not use street drugs. Do not share needles. Ask your health care provider for help if you need support or information about quitting drugs. General instructions Schedule regular health, dental, and eye exams. Stay current with your vaccines. Tell your health care provider if: You often feel depressed. You have ever been abused or do not feel safe at home. Summary Adopting a healthy lifestyle and getting preventive care are important in promoting health and wellness. Follow your health care provider's instructions about healthy diet, exercising, and getting tested or screened for diseases. Follow your health care provider's instructions on monitoring your cholesterol and blood pressure. This information is not intended to replace advice given to you by your health care provider. Make sure you discuss any questions you have with your health care provider. Document Revised: 03/23/2021 Document Reviewed: 03/23/2021 Elsevier Patient Education  2024 [employer]  

## 2023-05-03 NOTE — Progress Notes (Addendum)
Subjective:   Jose Li is a 67 y.o. male who presents for Medicare Annual/Subsequent preventive examination.  Visit Complete: Virtual  I connected with  Jose Li on 05/03/23 by a audio enabled telemedicine application and verified that I am speaking with the correct person using two identifiers.  Patient Location: Home  Provider Location: Home Office  I discussed the limitations of evaluation and management by telemedicine. The patient expressed understanding and agreed to proceed.    Review of Systems    Per HPI unless specifically indicated below.        Objective:    Today's Vitals   05/03/23 1409  PainSc: 5    There is no height or weight on file to calculate BMI.     05/03/2023    2:14 PM 03/29/2023    1:29 PM 03/28/2023   11:45 AM 03/22/2023    1:20 PM 01/24/2023    2:35 PM 09/20/2022    1:52 PM 08/24/2022    4:00 PM  Advanced Directives  Does Patient Have a Medical Advance Directive? Yes Yes Yes Yes No No No  Type of Estate agent of [place];Living will Healthcare Power of [employer] Power of Attorney     Does patient want to make changes to medical advance directive? No - Patient declined No - Patient declined  No - Patient declined     Copy of Healthcare Power of Attorney in Chart? No - copy requested No - copy requested No - copy requested No - copy requested     Would patient like information on creating a medical advance directive?     No - Patient declined No - Patient declined No - Patient declined    Current Medications (verified) Outpatient Encounter Medications as of 05/03/2023  Medication Sig   acetaminophen (TYLENOL) 325 MG tablet Take 2 tablets (650 mg total) by mouth every 4 (four) hours as needed for mild pain (temp > 100.5). (Patient taking differently: Take 1,300 mg by mouth every 4 (four) hours as needed for mild pain (temp > 100.5). Arthritis strength)   aspirin 81 MG EC tablet Take 81 mg by mouth daily.    atorvastatin (LIPITOR) 20 MG tablet Take 1 tablet (20 mg total) by mouth daily.   benztropine (COGENTIN) 1 MG tablet Take 1 mg by mouth at bedtime.   Bismuth Subsalicylate (KAOPECTATE) 262 MG TABS Take 262 mg by mouth every 6 (six) hours as needed (Diarrhea).   bismuth subsalicylate (PEPTO BISMOL) 262 MG/15ML suspension Take 30 mLs by mouth every 6 (six) hours as needed for diarrhea or loose stools.   Boswellia-Glucosamine-Vit D (OSTEO BI-FLEX ONE PER DAY PO) Take 1 tablet by mouth daily.   busPIRone (BUSPAR) 5 MG tablet Take 5 mg by mouth 2 (two) times daily.    cetirizine (ZYRTEC) 10 MG tablet Take 10 mg by mouth daily.   citalopram (CELEXA) 20 MG tablet Take 7 mg by mouth daily.   Ensure (ENSURE) Take 237 mLs by mouth daily. Chocolate   finasteride (PROSCAR) 5 MG tablet Take 1 tablet (5 mg total) by mouth daily.   levETIRAcetam (KEPPRA) 750 MG tablet Take 2 tablets (1,500 mg total) by mouth 2 (two) times daily.   midodrine (PROAMATINE) 5 MG tablet TAKE 2 TABLETS BY MOUTH ONCE DAILY AT 6 AM, THEN TAKE 1 TABLET BY MOUTH AT 10 AM, THEN TAKE 1 TABLET AT 2 PM   Multiple Vitamin (MULTIVITAMIN) tablet Take 1 tablet by mouth daily.  potassium chloride (KLOR-CON M) 10 MEQ tablet Take 10 mEq by mouth daily.   potassium chloride (KLOR-CON) 10 MEQ tablet Take 10 mEq by mouth daily.   pregabalin (LYRICA) 75 MG capsule Take 1 capsule (75 mg total) by mouth 2 (two) times daily.   risperiDONE (RISPERDAL) 2 MG tablet Take 2 mg by mouth at bedtime.    tamsulosin (FLOMAX) 0.4 MG CAPS capsule Take 0.4 mg by mouth daily after supper.   thiamine 250 MG tablet Take 250 mg by mouth daily.   traZODone (DESYREL) 100 MG tablet Take 200 mg by mouth at bedtime.   [DISCONTINUED] cefdinir (OMNICEF) 300 MG capsule Take 1 capsule (300 mg total) by mouth 2 (two) times daily. (Patient not taking: Reported on 05/03/2023)   [DISCONTINUED] cefTRIAXone (ROCEPHIN) injection 1 g    No facility-administered encounter medications on  file as of 05/03/2023.    Allergies (verified) Cymbalta [duloxetine hcl], Erythromycin, Hctz [hydrochlorothiazide], and Inderal [propranolol]   History: Past Medical History:  Diagnosis Date   Acute kidney injury (HCC) 05/26/2018   Allergy    Anemia    Anxiety    Arthritis    LEGS,BACK   Cataract    BILATERAL   CKD (chronic kidney disease), stage III (HCC)    Depression    History of kidney stones    Hyperlipidemia    Hypertension, essential, benign 08/02/2012   Orthostatic hypotension 10/09/2013   OSA (obstructive sleep apnea) 10/01/2015   Severe OSA with an AHI of 86/hr now on BiPAP at 23/19cm H2O   Pneumonia    Rhabdomyolysis 08/24/2022   Seizures (HCC)    none for last 6-50months   Sleep apnea    Substance abuse (HCC)    Past Surgical History:  Procedure Laterality Date   COLONOSCOPY     CRANIOTOMY Left 06/08/2022   Procedure: CRANIOTOMY HEMATOMA EVACUATION SUBDURAL;  Surgeon: Julio Sicks, MD;  Location: [facility];  Service: Neurosurgery;  Laterality: Left;   CYSTOSCOPY WITH RETROGRADE PYELOGRAM, URETEROSCOPY AND STENT PLACEMENT Right 03/29/2023   Procedure: CYSTOSCOPY WITH RETROGRADE PYELOGRAM, URETEROSCOPY AND STENT PLACEMENT;  Surgeon: [name], MD;  Location: [facility];  Service: Urology;  Laterality: Right;   EXTRACORPOREAL SHOCK WAVE LITHOTRIPSY Right 03/28/2023   Procedure: EXTRACORPOREAL SHOCK WAVE LITHOTRIPSY (ESWL);  Surgeon: [name], MD;  Location: [facility];  Service: Urology;  Laterality: Right;  Local anesthesia   FRACTURE SURGERY  1990   right hip and femur   HERNIA REPAIR  07/2009   umbilical hernia/ got infected   JOINT REPLACEMENT Right 1990   right hip   TRACHEOSTOMY  08/1999   Had pneumoniaand was in a coma 10 days   WISDOM TOOTH EXTRACTION     Family History  Problem Relation Age of Onset   Heart disease Mother    Diabetes Father    Heart disease Brother    Hypertension Neg Hx    Stroke Neg Hx    Colon cancer  Neg Hx    Colon polyps Neg Hx    Crohn's disease Neg Hx    Esophageal cancer Neg Hx    Rectal cancer Neg Hx    Stomach cancer Neg Hx    Ulcerative colitis Neg Hx    Social History   Socioeconomic History   Marital status: Divorced    Spouse name: Not on file   Number of children: 1   Years of education: 14   Highest education level: Associate degree: occupational, Scientist, product/process development, or vocational  program  Occupational History   Occupation: diability  Tobacco Use   Smoking status: Every Day    Packs/day: 1.00    Years: 30.00    Additional pack years: 0.00    Total pack years: 30.00    Types: Cigarettes    Passive exposure: Current   Smokeless tobacco: Never  Vaping Use   Vaping Use: Never used  Substance and Sexual Activity   Alcohol use: Not Currently    Comment: history of alcohol abuse.   Drug use: Not Currently    Comment: Hx of cocaine use "years ago"   Sexual activity: Not Currently  Other Topics Concern   Not on file  Social History Narrative   Patient lives alone. His father recently passed away.    A lot of stressors are coming from his passing.    Patient can not drive. Utilizes friends, taxis or medicare transportation.    Enjoys watching tv and working in the yard.    Social Determinants of Health   Financial Resource Strain: Low Risk  (05/03/2023)   Overall Financial Resource Strain (CARDIA)    Difficulty of Paying Living Expenses: Not hard at all  Food Insecurity: No Food Insecurity (05/03/2023)   Hunger Vital Sign    Worried About Running Out of Food in the Last Year: Never true    Ran Out of Food in the Last Year: Never true  Transportation Needs: No Transportation Needs (05/03/2023)   PRAPARE - Administrator, Civil Service (Medical): No    Lack of Transportation (Non-Medical): No  Recent Concern: Transportation Needs - Unmet Transportation Needs (02/15/2023)   PRAPARE - Transportation    Lack of Transportation (Medical): Yes    Lack of  Transportation (Non-Medical): Yes  Physical Activity: Insufficiently Active (05/03/2023)   Exercise Vital Sign    Days of Exercise per Week: 3 days    Minutes of Exercise per Session: 30 min  Stress: Stress Concern Present (05/03/2023)   [employer] of Occupational Health - Occupational Stress Questionnaire    Feeling of Stress : To some extent  Social Connections: Socially Isolated (05/03/2023)   Social Connection and Isolation Panel [NHANES]    Frequency of Communication with Friends and Family: More than three times a week    Frequency of Social Gatherings with Friends and Family: More than three times a week    Attends Religious Services: Never    Database administrator or Organizations: No    Attends Engineer, structural: Never    Marital Status: Divorced    Tobacco Counseling Ready to quit: Not Answered Counseling given: No   Clinical Intake:     Pain : 0-10 Pain Score: 5  Pain Type: Acute pain Pain Location: Leg Pain Orientation: Other (Comment) (bilateral leg pain, lower back) Pain Descriptors / Indicators: Aching Pain Onset: More than a month ago Pain Frequency: Intermittent     Nutritional Status: BMI of 19-24  Normal Nutritional Risks: Unintentional weight gain Diabetes: No  How often do you need to have someone help you when you read instructions, pamphlets, or other written materials from your doctor or pharmacy?: 1 - Never  Interpreter Needed?: No  Information entered by :: Laurel Dimmer, CMA   Activities of Daily Living    05/03/2023    2:07 PM 03/28/2023   11:52 AM  In your present state of health, do you have any difficulty performing the following activities:  Hearing? 0 0  Vision? 1 0  Comment [facility]   Difficulty concentrating or making decisions? 0 0  Walking or climbing stairs? 1 1  Dressing or bathing? 0 0  Doing errands, shopping? 1     Patient Care Team: Bess Kinds, MD as PCP - General (Family  Medicine) Pricilla Riffle, MD as PCP - Cardiology (Cardiology) Glean Salvo, NP as Nurse Practitioner (Neurology) Coralyn Pear, MD as Referring Physician (Urology) Juanell Fairly, RN as [facility] Care Management  Indicate any recent Medical Services you may have received from other than [facility] providers in the past year (date may be approximate).     Assessment:   This is a routine wellness examination for Yeriel.  Hearing/Vision screen Denies any hearing issues. Denies any vision changes. Annual Eye Exam Dietary issues and exercise activities discussed: Current Exercise Habits: Structured exercise class, Time (Minutes): 30, Frequency (Times/Week): 3, Weekly Exercise (Minutes/Week): 90, Intensity: Moderate, Exercise limited by: None identified   Goals Addressed   None    Depression Screen    05/03/2023    2:05 PM 05/03/2023    2:04 PM 02/15/2023   10:17 AM 01/24/2023    2:39 PM 09/20/2022    1:55 PM 07/26/2022    3:46 PM 04/21/2022    3:25 PM  PHQ 2/9 Scores  PHQ - 2 Score 1 1 6 3 3 2 3   PHQ- 9 Score 7  14 5 13 8 16     Fall Risk    05/03/2023    2:05 PM 01/24/2023    2:39 PM 01/20/2022   10:03 AM 07/13/2019    2:27 PM 04/17/2019    2:18 PM  Fall Risk   Falls in the past year? 1 1 1  0 1  Number falls in past yr: 1 1 1  0 0  Injury with Fall? 1 0 1  1  Risk for fall due to : Impaired balance/gait History of fall(s) History of fall(s);Medication side effect;Mental status change  History of fall(s);Mental status change;Impaired vision  Follow up Falls evaluation completed Falls prevention discussed Falls prevention discussed Falls evaluation completed Falls prevention discussed    MEDICARE RISK AT HOME: No notified home risk factors.    TIMED UP AND GO:  Was the test performed?  No    Cognitive Function:        05/03/2023    2:08 PM 01/20/2022   10:04 AM 04/17/2019    2:24 PM  6CIT Screen  What Year? 0 points 0 points 0 points  What month? 0 points 0 points 0  points  What time? 0 points 0 points 0 points  Count back from 20 0 points 0 points 0 points  Months in reverse 0 points 0 points 0 points  Repeat phrase 0 points 0 points 0 points  Total Score 0 points 0 points 0 points    Immunizations Immunization History  Administered Date(s) Administered   Influenza Split 08/18/2011, 08/02/2012   Influenza Whole 09/06/2007, 08/21/2008, 08/22/2008   Influenza,inj,Quad PF,6+ Mos 09/21/2013, 09/13/2014, 07/07/2016, 01/23/2018, 07/27/2018, 07/13/2019, 09/09/2020   Janssen (J&J) SARS-COV-2 Vaccination 02/25/2020   Moderna SARS-COV2 Booster Vaccination 10/16/2020, 04/13/2021   Pfizer Covid-19 Vaccine Bivalent Booster 34yrs & up 04/07/2022   Pneumococcal Polysaccharide-23 12/29/2015   Td 11/15/2005   Tdap 01/28/2016    TDAP status: Up to date  Flu Vaccine status: Up to date  Pneumococcal vaccine status: Due, Education has been provided regarding the importance of this vaccine. Advised may receive this vaccine at local pharmacy or Health Dept.  Aware to provide a copy of the vaccination record if obtained from local pharmacy or Health Dept. Verbalized acceptance and understanding.  Covid-19 vaccine status: Information provided on how to obtain vaccines.   Qualifies for Shingles Vaccine? Yes   Zostavax completed No   Shingrix Completed?: No.    Education has been provided regarding the importance of this vaccine. Patient has been advised to call insurance company to determine out of pocket expense if they have not yet received this vaccine. Advised may also receive vaccine at local pharmacy or Health Dept. Verbalized acceptance and understanding.  Screening Tests Health Maintenance  Topic Date Due   Zoster Vaccines- Shingrix (1 of 2) Never done   Pneumonia Vaccine 83+ Years old (2 of 2 - PCV) 12/28/2016   COVID-19 Vaccine (5 - 2023-24 season) 07/16/2022   INFLUENZA VACCINE  06/16/2023   Lung Cancer Screening  08/11/2023   Medicare Annual Wellness  (AWV)  05/02/2024   DTaP/Tdap/Td (3 - Td or Tdap) 01/27/2026   Colonoscopy  12/22/2032   Hepatitis C Screening  Completed   HIV Screening  Completed   HPV VACCINES  Aged Out    Health Maintenance  Health Maintenance Due  Topic Date Due   Zoster Vaccines- Shingrix (1 of 2) Never done   Pneumonia Vaccine 77+ Years old (2 of 2 - PCV) 12/28/2016   COVID-19 Vaccine (5 - 2023-24 season) 07/16/2022    Colorectal cancer screening: Type of screening: Colonoscopy. Completed 12/22/2022. Repeat every 10 years  Lung Cancer Screening: (Low Dose CT Chest recommended if Age 70-80 years, 20 pack-year currently smoking OR have quit w/in 15years.) does qualify.   Lung Cancer Screening Referral: no referral placed , next CT scan due 08/11/2023  Additional Screening:  Hepatitis C Screening: does qualify; Completed 07/13/2019  Vision Screening: Recommended annual ophthalmology exams for early detection of glaucoma and other disorders of the eye. Is the patient up to date with their annual eye exam?  Yes  Who is the provider or what is the name of the office in which the patient attends annual eye exams? [facility]  If pt is not established with a provider, would they like to be referred to a provider to establish care? No .   Dental Screening: Recommended annual dental exams for proper oral hygiene    Community Resource Referral / Chronic Care Management: CRR required this visit?  No   CCM required this visit?  No     Plan:     I have personally reviewed and noted the following in the patient's chart:   Medical and social history Use of alcohol, tobacco or illicit drugs  Current medications and supplements including opioid prescriptions. Patient is not currently taking opioid prescriptions. Functional ability and status Nutritional status Physical activity Advanced directives List of other physicians Hospitalizations, surgeries, and ER visits in previous 12  months Vitals Screenings to include cognitive, depression, and falls Referrals and appointments  In addition, I have reviewed and discussed with patient certain preventive protocols, quality metrics, and best practice recommendations. A written personalized care plan for preventive services as well as general preventive health recommendations were provided to patient.    Mr. Keuler , Thank you for taking time to come for your Medicare Wellness Visit. I appreciate your ongoing commitment to your health goals. Please review the following plan we discussed and let me know if I can assist you in the future.   These are the goals we discussed:  Goals  None     This is a list of the screening recommended for you and due dates:  Health Maintenance  Topic Date Due   Zoster (Shingles) Vaccine (1 of 2) Never done   Pneumonia Vaccine (2 of 2 - PCV) 12/28/2016   COVID-19 Vaccine (5 - 2023-24 season) 07/16/2022   Flu Shot  06/16/2023   Screening for Lung Cancer  08/11/2023   Medicare Annual Wellness Visit  05/02/2024   DTaP/Tdap/Td vaccine (3 - Td or Tdap) 01/27/2026   Colon Cancer Screening  12/22/2032   Hepatitis C Screening  Completed   HIV Screening  Completed   HPV Vaccine  Aged Out     [place], [place]   05/03/2023   After Visit Summary: (MyChart) Due to this being a telephonic visit, the after visit summary with patients personalized plan was offered to patient via MyChart   Nurse Notes: Approximately 30 minute Non-Face -To-Face Medicare Wellness Visit     I have reviewed this visit and agree with the documentation.  Terisa Starr, MD  Family Medicine Teaching Service

## 2023-05-06 ENCOUNTER — Ambulatory Visit: Payer: Self-pay | Admitting: *Deleted

## 2023-05-06 NOTE — Patient Outreach (Signed)
  Care Coordination   Follow Up Visit Note   05/06/2023 Name: Jose Li MRN: [id number] DOB: 08/02/1956  Jose Li is a 67 y.o. year old male who sees Bess Kinds, MD for primary care. I spoke with  Jose Li by phone today.  What matters to the patients health and wellness today?  Has not heart from [facility] regarding SCAT application    Goals Addressed             This Visit's Progress    Seek opportunities to enhance quality of life       Care Coordination Interventions and Goals: SCAT application requiring some updates/clarification by PCP- will resubmit to [facility] after revised by PCP Solution-Focused Strategies employed:  Active listening / Reflection utilized  Problem Solving /Task Center strategies reviewed Provided general psycho-education for mental health needs  Reviewed mental health medications and discussed importance of compliance:  Participation in counseling encouraged : pt declines at this time Participation in support group encouraged : declines at this time Consider volunteer opportunities   Transportation provided by insurance provider ( )  Continue to remain alcohol free- great job         SDOH assessments and interventions completed:  Yes     Care Coordination Interventions:  Yes, provided  Interventions Today    Flowsheet Row Most Recent Value  Chronic Disease   Chronic disease during today's visit Other  [TBI/seizures]  General Interventions   General Interventions Discussed/Reviewed Community Resources  [CSW called [facility] who indicates application needing additional clarification from PCP- will assist with revision and faxing from [facility].]  Mental Health Interventions   Mental Health Discussed/Reviewed Coping Strategies       Follow up plan: Follow up call scheduled for 05/26/23    Encounter Outcome:  Pt. Visit Completed

## 2023-05-06 NOTE — Patient Instructions (Signed)
Visit Information  Thank you for taking time to visit with me today. Please don't hesitate to contact me if I can be of assistance to you.   Following are the goals we discussed today:   Goals Addressed             This Visit's Progress    Seek opportunities to enhance quality of life       Care Coordination Interventions and Goals: SCAT application requiring some updates/clarification by PCP- will resubmit to [facility] after revised by PCP Solution-Focused Strategies employed:  Active listening / Reflection utilized  Problem Solving /Task Center strategies reviewed Provided general psycho-education for mental health needs  Reviewed mental health medications and discussed importance of compliance:  Participation in counseling encouraged : pt declines at this time Participation in support group encouraged : declines at this time Consider volunteer opportunities   Transportation provided by insurance provider ( )  Continue to remain alcohol free- great job         Our next appointment is by telephone on 05/26/23  Please call the care guide team at [phone] if you need to cancel or reschedule your appointment.   If you are experiencing a Mental Health or Behavioral Health Crisis or need someone to talk to, please call the Suicide and Crisis Lifeline: 988 call 911   The patient verbalized understanding of instructions, educational materials, and care plan provided today and DECLINED offer to receive copy of patient instructions, educational materials, and care plan.   Telephone follow up appointment with care management team member scheduled for: 05/26/23  Reece Levy, MSW, LCSW Clinical Social Worker [facility] [phone]

## 2023-05-12 ENCOUNTER — Ambulatory Visit
Admission: RE | Admit: 2023-05-12 | Discharge: 2023-05-12 | Disposition: A | Discharge status: Home / Self Care | Payer: 59 | Source: Ambulatory Visit | Attending: Urology | Admitting: Urology

## 2023-05-12 DIAGNOSIS — N200 Calculus of kidney: Secondary | ICD-10-CM

## 2023-05-12 DIAGNOSIS — N281 Cyst of kidney, acquired: Secondary | ICD-10-CM | POA: Diagnosis not present

## 2023-05-13 ENCOUNTER — Ambulatory Visit: Payer: Self-pay

## 2023-05-13 NOTE — Patient Outreach (Addendum)
  Care Coordination   Follow Up Visit Note   05/13/2023 Name: Jose Li MRN: [id number] DOB: [birth year]  Jose Li is a 67 y.o. year old male who sees Bess Kinds, MD for primary care. I spoke with  Jose Li by phone today.  What matters to the patients health and wellness today?  Mr. Embree is doing ok. He has been having pain in his legs. He rates the pain at a 6/10. He thinks it is coming from his knees. I advised him to see about his knees; he is concerned about spending money because he is spending money on kidney stones. He is going to see his primary care for bilateral knee pain. He stated that he fell down the stairs six months ago, and the pain is throbbing. I sent him a communication to the office, and he has an appointment on 05/18/23 at 1110 am.    he said his blood pressure has been tremendous. His last reading was 115/70.    Goals Addressed               This Visit's Progress     Patient wants to promote comfort and mobility for bilateral knee pain (pt-stated)        Patient Goals/Self Care Activities: -Patient/Caregiver will self-administer medications as prescribed as evidenced by self-report/primary caregiver report  -Patient/Caregiver will attend all scheduled provider appointments as evidenced by clinician review of documented attendance to scheduled appointments and patient/caregiver report -Patient/Caregiver will call pharmacy for medication refills as evidenced by patient report and review of pharmacy fill history as appropriate -Patient/Caregiver will call provider office for new concerns or questions as evidenced by review of documented incoming telephone call notes and patient report -Patient/Caregiver verbalizes understanding of plan -Patient/Caregiver will focus on medication adherence by taking medications as prescribed  -rest -elevation -ice the area Helped the patient get an appointment with his PCP on 05/18/23 1110 am         SDOH assessments  and interventions completed:  No     Care Coordination Interventions:  Yes, provided   Interventions Today    Flowsheet Row Most Recent Value  Chronic Disease   Chronic disease during today's visit Other  [Bilateral kneee pain]  General Interventions   General Interventions Discussed/Reviewed General Interventions Discussed  Safety Interventions   Safety Discussed/Reviewed Safety Discussed, Safety Reviewed       Follow up plan: Follow up call scheduled for 06/14/23 1130 am    Encounter Outcome:  Pt. Visit Completed   Juanell Fairly RN, BSN, [facility] Care Coordinator [facility]   Phone: [phone]

## 2023-05-13 NOTE — Patient Instructions (Addendum)
Visit Information  Thank you for taking time to visit with me today. Please don't hesitate to contact me if I can be of assistance to you.   Following are the goals we discussed today:   Goals Addressed               This Visit's Progress     Patient wants to promote comfort and mobility for bilateral knee pain (pt-stated)        Patient Goals/Self Care Activities: -Patient/Caregiver will self-administer medications as prescribed as evidenced by self-report/primary caregiver report  -Patient/Caregiver will attend all scheduled provider appointments as evidenced by clinician review of documented attendance to scheduled appointments and patient/caregiver report -Patient/Caregiver will call pharmacy for medication refills as evidenced by patient report and review of pharmacy fill history as appropriate -Patient/Caregiver will call provider office for new concerns or questions as evidenced by review of documented incoming telephone call notes and patient report -Patient/Caregiver verbalizes understanding of plan -Patient/Caregiver will focus on medication adherence by taking medications as prescribed  -rest -elevation -ice the area Helped the patient get an appointment with his PCP on 05/18/23 1110 am         Our next appointment is by telephone on 06/14/23 at 1130 am  Please call the care guide team at [phone] if you need to cancel or reschedule your appointment.   If you are experiencing a Mental Health or Behavioral Health Crisis or need someone to talk to, please call 1-800-273-TALK (toll free, 24 hour hotline)  Patient verbalizes understanding of instructions and care plan provided today and agrees to view in MyChart. Active MyChart status and patient understanding of how to access instructions and care plan via MyChart confirmed with patient.     Juanell Fairly RN, BSN, [facility] Care Coordinator [facility]   Phone: [phone]

## 2023-05-16 ENCOUNTER — Other Ambulatory Visit (HOSPITAL_BASED_OUTPATIENT_CLINIC_OR_DEPARTMENT_OTHER): Payer: 59

## 2023-05-18 ENCOUNTER — Encounter: Payer: Self-pay | Admitting: Student

## 2023-05-18 ENCOUNTER — Ambulatory Visit (INDEPENDENT_AMBULATORY_CARE_PROVIDER_SITE_OTHER): Payer: 59 | Admitting: Student

## 2023-05-18 ENCOUNTER — Other Ambulatory Visit: Payer: Self-pay

## 2023-05-18 VITALS — BP 117/66 | HR 84 | Ht 72.0 in | Wt 175.0 lb

## 2023-05-18 DIAGNOSIS — G8929 Other chronic pain: Secondary | ICD-10-CM

## 2023-05-18 DIAGNOSIS — M25562 Pain in left knee: Secondary | ICD-10-CM | POA: Diagnosis not present

## 2023-05-18 DIAGNOSIS — M25561 Pain in right knee: Secondary | ICD-10-CM

## 2023-05-18 NOTE — Patient Instructions (Signed)
It was great to see you! Thank you for allowing me to participate in your care!  I recommend that you always bring your medications to each appointment as this makes it easy to ensure we are on the correct medications and helps [place] not miss when refills are needed.  Our plans for today:  - Knee Pain  This sounds a lot like arthritis. We will obtain some imaging of the joints to see  Try using Salonpass (lidocaine patches) on knee daily. Can wear for 12 hours at a time.  Try using Voltaren Gel, can apply to knees when not wearing lidocaine patch       Take care and seek immediate care sooner if you develop any concerns.   Dr. Bess Kinds, MD [facility] Medicine

## 2023-05-18 NOTE — Progress Notes (Addendum)
  SUBJECTIVE:   CHIEF COMPLAINT / HPI:   Bilateral Knee Pain Has been an issue for about a year. Has been worse since he fell down the stairs. Had been working with a PT and the pain and balance got better. But now the pain is bad. Pain in knee is throbbing and happens all the time. Worse with activity and notices his balance is having issues. Notes sometimes he has swelling, redness and warmth in knees. Pain rated a 7/10 and is constant. Note's that a couple times his knees have buckeld underneath him/given out. 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 Patient otherwise feeling normal state of health.   PERTINENT  PMH / PSH:     Patient Care Team: Bess Kinds, MD as PCP - General (Family Medicine) Pricilla Riffle, MD as PCP - Cardiology (Cardiology) Glean Salvo, NP as Nurse Practitioner (Neurology) Coralyn Pear, MD as Referring Physician (Urology) Juanell Fairly, RN as [facility] Management Elijah Birk, Weyman Croon, LCSW as Social Worker OBJECTIVE:  BP 117/66   Pulse 84   Ht 6' (1.829 m)   Wt 175 lb (79.4 kg)   SpO2 100%   BMI 23.73 kg/m  Physical Exam Musculoskeletal:     Right knee: Effusion present. No swelling or erythema. Normal range of motion. Tenderness present over the medial joint line.     Left knee: No effusion or erythema. Normal range of motion. Tenderness present over the medial joint line.      ASSESSMENT/PLAN:  Chronic pain of both knees Assessment & Plan: Patient comes in for bilateral knee pain has been an issue for going on a year.  Patient notes pains been worse since his fall down the stairs, which was remote.  Patient had been working with PT, reports that balance and pain have gotten better, however pain is now worse.  Patient appreciates knee is throbbing, worse with activity, and causing him some balance issues.  Pain rated 7 out of 10.  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  Given that pain is in bilateral  knees low concern for septic joint.  Most likely OA given length of symptoms, work history, and knee exam.  Low concern for a meniscal or ligamentous tear, given physical exam, despite history of falling downstairs in past. Will recommend OTC pain management, and x-ray imaging. - DG complete left and right knee - Voltaren gel, lidocaine patches prn  Orders: -     DG Knee Complete 4 Views Right; Future -     DG Knee Complete 4 Views Left; Future   No follow-ups on file. Bess Kinds, MD 07/12/2023, 8:03 AM PGY-2, [facility]

## 2023-05-20 DIAGNOSIS — H524 Presbyopia: Secondary | ICD-10-CM | POA: Diagnosis not present

## 2023-05-20 DIAGNOSIS — H5203 Hypermetropia, bilateral: Secondary | ICD-10-CM | POA: Diagnosis not present

## 2023-05-20 DIAGNOSIS — H2513 Age-related nuclear cataract, bilateral: Secondary | ICD-10-CM | POA: Diagnosis not present

## 2023-05-23 ENCOUNTER — Ambulatory Visit
Admission: RE | Admit: 2023-05-23 | Discharge: 2023-05-23 | Disposition: A | Discharge status: Home / Self Care | Payer: 59 | Source: Ambulatory Visit | Attending: Family Medicine | Admitting: Family Medicine

## 2023-05-23 DIAGNOSIS — M25562 Pain in left knee: Secondary | ICD-10-CM | POA: Diagnosis not present

## 2023-05-23 DIAGNOSIS — M170 Bilateral primary osteoarthritis of knee: Secondary | ICD-10-CM | POA: Diagnosis not present

## 2023-05-23 DIAGNOSIS — G8929 Other chronic pain: Secondary | ICD-10-CM

## 2023-05-23 DIAGNOSIS — M25561 Pain in right knee: Secondary | ICD-10-CM | POA: Diagnosis not present

## 2023-05-26 ENCOUNTER — Encounter: Payer: Self-pay | Admitting: Urology

## 2023-05-26 ENCOUNTER — Encounter: Payer: Self-pay | Admitting: *Deleted

## 2023-05-26 ENCOUNTER — Ambulatory Visit (INDEPENDENT_AMBULATORY_CARE_PROVIDER_SITE_OTHER): Payer: 59 | Admitting: Urology

## 2023-05-26 VITALS — BP 127/74 | HR 109 | Ht 71.0 in | Wt 170.0 lb

## 2023-05-26 DIAGNOSIS — N200 Calculus of kidney: Secondary | ICD-10-CM

## 2023-05-26 DIAGNOSIS — R3129 Other microscopic hematuria: Secondary | ICD-10-CM | POA: Diagnosis not present

## 2023-05-26 LAB — URINALYSIS, ROUTINE W REFLEX MICROSCOPIC
Bilirubin, UA: NEGATIVE
Glucose, UA: NEGATIVE
Leukocytes,UA: NEGATIVE
Nitrite, UA: NEGATIVE
Protein,UA: NEGATIVE
RBC, UA: NEGATIVE
Specific Gravity, UA: 1.025 (ref 1.005–1.030)
Urobilinogen, Ur: 0.2 mg/dL (ref 0.2–1.0)
pH, UA: 6 (ref 5.0–7.5)

## 2023-05-26 NOTE — Progress Notes (Signed)
Assessment: 1. Nephrolithiasis     Plan: Stone prevention measures reviewed in detail today with the patient including provision of educational material for him. Follow-up as needed  Chief Complaint: Kidney stones  HPI: Jose Li is a 67 y.o. male who presents for continued evaluation of nephrolithiasis. Patient is status post ureteroscopic management of a right proximal ureteral stone 03/29/2023.  He has done very well and presents here today for follow-up.  Today I reviewed his recent follow-up renal ultrasound which shows resolution of hydro and no definite stones.  CT at the time of initial presentation did show small nonobstructing bilateral calculi. Patient reports no current GU complaints.  Portions of the above documentation were copied from a prior visit for review purposes only.  Allergies: Allergies  Allergen Reactions   Cymbalta [Duloxetine Hcl] Other (See Comments)    Hyponatremia    Erythromycin Other (See Comments)    Unknown reation   Hctz [Hydrochlorothiazide] Other (See Comments)    Syncope   Inderal [Propranolol] Other (See Comments)    Syncope     PMH: Past Medical History:  Diagnosis Date   Acute kidney injury (HCC) 05/26/2018   Allergy    Anemia    Anxiety    Arthritis    LEGS,BACK   Cataract    BILATERAL   CKD (chronic kidney disease), stage III (HCC)    Depression    History of kidney stones    Hyperlipidemia    Hypertension, essential, benign 08/02/2012   Orthostatic hypotension 10/09/2013   OSA (obstructive sleep apnea) 10/01/2015   Severe OSA with an AHI of 86/hr now on BiPAP at 23/19cm H2O   Pneumonia    Rhabdomyolysis 08/24/2022   Seizures (HCC)    none for last 6-59months   Sleep apnea    Substance abuse (HCC)     PSH: Past Surgical History:  Procedure Laterality Date   COLONOSCOPY     CRANIOTOMY Left 06/08/2022   Procedure: CRANIOTOMY HEMATOMA EVACUATION SUBDURAL;  Surgeon: Julio Sicks, MD;  Location: [facility] OR;  Service:  Neurosurgery;  Laterality: Left;   CYSTOSCOPY WITH RETROGRADE PYELOGRAM, URETEROSCOPY AND STENT PLACEMENT Right 03/29/2023   Procedure: CYSTOSCOPY WITH RETROGRADE PYELOGRAM, URETEROSCOPY AND STENT PLACEMENT;  Surgeon: [name], MD;  Location: [facility] ORS;  Service: Urology;  Laterality: Right;   EXTRACORPOREAL SHOCK WAVE LITHOTRIPSY Right 03/28/2023   Procedure: EXTRACORPOREAL SHOCK WAVE LITHOTRIPSY (ESWL);  Surgeon: [name], MD;  Location: [facility];  Service: Urology;  Laterality: Right;  Local anesthesia   FRACTURE SURGERY  1990   right hip and femur   HERNIA REPAIR  07/2009   umbilical hernia/ got infected   JOINT REPLACEMENT Right 1990   right hip   TRACHEOSTOMY  08/1999   Had pneumoniaand was in a coma 10 days   WISDOM TOOTH EXTRACTION      SH: Social History   Tobacco Use   Smoking status: Every Day    Current packs/day: 1.00    Average packs/day: 1 pack/day for 30.0 years (30.0 ttl pk-yrs)    Types: Cigarettes    Passive exposure: Current   Smokeless tobacco: Never  Vaping Use   Vaping status: Never Used  Substance Use Topics   Alcohol use: Not Currently    Comment: history of alcohol abuse.   Drug use: Not Currently    Comment: Hx of cocaine use "years ago"    ROS: Constitutional:  Negative for fever, chills, weight loss CV: Negative for chest  pain, previous MI, hypertension Respiratory:  Negative for shortness of breath, wheezing, sleep apnea, frequent cough GI:  Negative for nausea, vomiting, bloody stool, GERD  PE: BP 127/74   Pulse (!) 109   Ht 5\' 11"  (1.803 m)   Wt 170 lb (77.1 kg)   BMI 23.71 kg/m  GENERAL APPEARANCE:  Well appearing, well developed, well nourished, NAD    Results: UA neg

## 2023-06-01 ENCOUNTER — Ambulatory Visit: Payer: Self-pay | Admitting: *Deleted

## 2023-06-01 NOTE — Patient Instructions (Signed)
Visit Information  Thank you for taking time to visit with me today. Please don't hesitate to contact me if I can be of assistance to you.   Following are the goals we discussed today:   Goals Addressed             This Visit's Progress    Seek opportunities to enhance quality of life       Care Coordination Interventions and Goals: SCAT application revised by PCP per request of [place]TA for completion and determination- await final notice Solution-Focused Strategies employed Active listening / Reflection utilized  Problem Solving /Task Center strategies reviewed Provided general psycho-education for mental health needs  Reviewed mental health medications and discussed importance of compliance:  Participation in counseling encouraged : pt declines at this time Participation in support group encouraged : declines at this time Consider volunteer opportunities   Transportation provided by insurance provider ( )  Continue to remain alcohol free- great job         Our next appointment is by telephone on 06/17/23  Please call the care guide team at [phone] if you need to cancel or reschedule your appointment.   If you are experiencing a Mental Health or Behavioral Health Crisis or need someone to talk to, please call the Suicide and Crisis Lifeline: 988 call 911   The patient verbalized understanding of instructions, educational materials, and care plan provided today and DECLINED offer to receive copy of patient instructions, educational materials, and care plan.   Telephone follow up appointment with care management team member scheduled for: 06/17/23  Reece Levy, MSW, LCSW Clinical Social Worker [facility] [phone]

## 2023-06-01 NOTE — Patient Outreach (Signed)
  Care Coordination   Follow Up Visit Note   06/01/2023 Name: SHANNON KIRKENDALL MRN: [id number] DOB: 02-21-1956  [name] is a 67 y.o. year old male who sees Bess Kinds, MD for primary care. I spoke with  [name] by phone today.  What matters to the patients health and wellness today?  SCAT asking for PCP to edit application due to missing pieces     Goals Addressed             This Visit's Progress    Seek opportunities to enhance quality of life       Care Coordination Interventions and Goals: SCAT application revised by PCP per request of [place]TA for completion and determination- await final notice Solution-Focused Strategies employed Active listening / Reflection utilized  Problem Solving /Task Center strategies reviewed Provided general psycho-education for mental health needs  Reviewed mental health medications and discussed importance of compliance:  Participation in counseling encouraged : pt declines at this time Participation in support group encouraged : declines at this time Consider volunteer opportunities   Transportation provided by insurance provider ( )  Continue to remain alcohol free- great job         SDOH assessments and interventions completed:  Yes     Care Coordination Interventions:  Yes, provided  Interventions Today    Flowsheet Row Most Recent Value  Chronic Disease   Chronic disease during today's visit Other  [History of TBI]  General Interventions   General Interventions Discussed/Reviewed [employer] assisting pt with SCAT transportation application process]       Follow up plan: Follow up call scheduled for 06/17/23    Encounter Outcome:  Pt. Visit Completed

## 2023-06-10 DIAGNOSIS — E785 Hyperlipidemia, unspecified: Secondary | ICD-10-CM | POA: Diagnosis not present

## 2023-06-10 DIAGNOSIS — R197 Diarrhea, unspecified: Secondary | ICD-10-CM | POA: Diagnosis not present

## 2023-06-10 DIAGNOSIS — Z0000 Encounter for general adult medical examination without abnormal findings: Secondary | ICD-10-CM | POA: Diagnosis not present

## 2023-06-10 DIAGNOSIS — M1990 Unspecified osteoarthritis, unspecified site: Secondary | ICD-10-CM | POA: Diagnosis not present

## 2023-06-10 DIAGNOSIS — I10 Essential (primary) hypertension: Secondary | ICD-10-CM | POA: Diagnosis not present

## 2023-06-10 DIAGNOSIS — Z79899 Other long term (current) drug therapy: Secondary | ICD-10-CM | POA: Diagnosis not present

## 2023-06-14 ENCOUNTER — Ambulatory Visit: Payer: Self-pay

## 2023-06-14 NOTE — Patient Instructions (Signed)
Visit Information  Thank you for taking time to visit with me today. Please don't hesitate to contact me if I can be of assistance to you.   Following are the goals we discussed today:   Goals Addressed               This Visit's Progress     Care Coordination Activities-The patient needs has dizziness        Patient Goals/Self Care Activities: -Patient/Caregiver will self-administer medications as prescribed as evidenced by self-report/primary caregiver report  -Patient/Caregiver will attend all scheduled provider appointments as evidenced by clinician review of documented attendance to scheduled appointments and patient/caregiver report -Patient/Caregiver will focus on medication adherence by taking medications as prescribed  Care Coordination Interventions: Evaluation of current treatment plan related to dizziness and patient's adherence to plan as established by provider Advised patient to drink more fluids and eat more food.  Try six small meals a day if possible Reviewed medications with patient and discussed importance.       COMPLETED: Patient wants to promote comfort and mobility for bilateral knee pain (pt-stated)        Patient Goals/Self Care Activities: -Patient/Caregiver will self-administer medications as prescribed as evidenced by self-report/primary caregiver report  -Patient/Caregiver will attend all scheduled provider appointments as evidenced by clinician review of documented attendance to scheduled appointments and patient/caregiver report -Patient/Caregiver will call pharmacy for medication refills as evidenced by patient report and review of pharmacy fill history as appropriate -Patient/Caregiver will call provider office for new concerns or questions as evidenced by review of documented incoming telephone call notes and patient report -Patient/Caregiver verbalizes understanding of plan -Patient/Caregiver will focus on medication adherence by taking medications  as prescribed  -rest -elevation -ice the area Helped the patient get an appointment with his PCP on 05/18/23 1110 am         Our next appointment is by telephone on 07/15/23 at 11 am  Please call the care guide team at [phone] if you need to cancel or reschedule your appointment.   If you are experiencing a Mental Health or Behavioral Health Crisis or need someone to talk to, please call 1-800-273-TALK (toll free, 24 hour hotline)  Patient verbalizes understanding of instructions and care plan provided today and agrees to view in MyChart. Active MyChart status and patient understanding of how to access instructions and care plan via MyChart confirmed with patient.     Juanell Fairly RN, BSN, [facility] Care Coordinator [facility]   Phone: [phone]

## 2023-06-14 NOTE — Patient Outreach (Signed)
  Care Coordination   Follow Up Visit Note   06/14/2023 Name: Jose Li MRN: [id number] DOB: 1956/11/01  Jose Li is a 67 y.o. year old male who sees Bess Kinds, MD for primary care. I spoke with  Jose Li by phone today.  What matters to the patients health and wellness today?  Mr. Hundley' health status is satisfactory. His visits to the nephrologist have been concluded until another year, and he is currently free from kidney stones. His recent medical evaluation,  two days ago, indicated positive results. Although he has been experiencing intermittent headaches and dizziness, the correlation is uncertain. The doctor has been notified and recommended an increase in fluid intake and regular meals. Mr. Tumey has ceased using a cane and now rests when experiencing dizziness. His weight has remained stable, with a decrease of one to two pounds.     Goals Addressed               This Visit's Progress     Care Coordination Activities-The patient needs has dizziness        Patient Goals/Self Care Activities: -Patient/Caregiver will self-administer medications as prescribed as evidenced by self-report/primary caregiver report  -Patient/Caregiver will attend all scheduled provider appointments as evidenced by clinician review of documented attendance to scheduled appointments and patient/caregiver report -Patient/Caregiver will focus on medication adherence by taking medications as prescribed  Care Coordination Interventions: Evaluation of current treatment plan related to dizziness and patient's adherence to plan as established by provider Advised patient to drink more fluids and eat more food.  Try six small meals a day if possible Reviewed medications with patient and discussed importance.                      SDOH assessments and interventions completed:  No     Care Coordination Interventions:  Yes, provided   Interventions Today    Flowsheet Row Most Recent  Value  Chronic Disease   Chronic disease during today's visit Other  [Dizziness]  General Interventions   General Interventions Discussed/Reviewed General Interventions Discussed, General Interventions Reviewed  Education Interventions   Education Provided Provided Education  Nutrition Interventions   Nutrition Discussed/Reviewed Nutrition Discussed, Fluid intake  Safety Interventions   Safety Discussed/Reviewed Safety Reviewed          Follow up plan: Follow up call scheduled for 07/15/23  11 am    Encounter Outcome:  Pt. Visit Completed   Juanell Fairly RN, BSN, [facility] Care Coordinator [facility]   Phone: [phone]

## 2023-06-17 ENCOUNTER — Encounter: Payer: Self-pay | Admitting: *Deleted

## 2023-06-28 ENCOUNTER — Encounter: Payer: Self-pay | Admitting: Family Medicine

## 2023-06-28 ENCOUNTER — Telehealth: Payer: Self-pay | Admitting: Family Medicine

## 2023-06-28 NOTE — Telephone Encounter (Signed)
Called with results of bilateral knee x-rays Using Voltaren gel with relief All questions answered Terisa Starr, MD  [facility] Medicine Teaching Service

## 2023-06-28 NOTE — Telephone Encounter (Signed)
Called and discussed with patient when discussed results of xrays Jose Starr, MD  [facility] Medicine Teaching Service

## 2023-07-07 ENCOUNTER — Ambulatory Visit: Payer: Self-pay | Admitting: *Deleted

## 2023-07-07 NOTE — Patient Outreach (Signed)
  Care Coordination   Follow Up Visit Note   07/07/2023 Name: STEDMON HIBDON MRN: [id number] DOB: 19-Jun-1956  [name] is a 67 y.o. year old male who sees Bess Kinds, MD for primary care. I spoke with  [name] by phone today.  What matters to the patients health and wellness today?  Need transportation     Goals Addressed             This Visit's Progress    Seek opportunities to enhance quality of life       Care Coordination Interventions and Goals: SCAT application requested updates to application by July. Will need to restart and reapply. I will assist with getting completed with PCP and sent in to [facility]. Solution-Focused Strategies employed Active listening / Reflection utilized  Problem Solving /Task Center strategies reviewed Provided general psycho-education for mental health needs  Reviewed mental health medications and discussed importance of compliance:  Participation in counseling encouraged : pt declines at this time Participation in support group encouraged : declines at this time Consider volunteer opportunities   Transportation provided by insurance provider ( )  Continue to remain alcohol free- great job         SDOH assessments and interventions completed:  Yes     Care Coordination Interventions:  Yes, provided  Interventions Today    Flowsheet Row Most Recent Value  Chronic Disease   Chronic disease during today's visit Other  [TBI]  General Interventions   General Interventions Discussed/Reviewed [employer]  [Pt rec'd letter from [employer] indicating some updates needed to SCAT application by July. CSW assisting pt with completion of  updated application and will submit.]       Follow up plan: Follow up call scheduled for 07/14/23    Encounter Outcome:  Pt. Visit Completed

## 2023-07-07 NOTE — Patient Instructions (Signed)
Visit Information  Thank you for taking time to visit with me today. Please don't hesitate to contact me if I can be of assistance to you.   Following are the goals we discussed today:   Goals Addressed             This Visit's Progress    Seek opportunities to enhance quality of life       Care Coordination Interventions and Goals: SCAT application requested updates to application by July. Will need to restart and reapply. I will assist with getting completed with PCP and sent in to [facility]. Solution-Focused Strategies employed Active listening / Reflection utilized  Problem Solving /Task Center strategies reviewed Provided general psycho-education for mental health needs  Reviewed mental health medications and discussed importance of compliance:  Participation in counseling encouraged : pt declines at this time Participation in support group encouraged : declines at this time Consider volunteer opportunities   Transportation provided by insurance provider ( )  Continue to remain alcohol free- great job         Our next appointment is by telephone on 07/14/23 a   Please call the care guide team at [phone] if you need to cancel or reschedule your appointment.   If you are experiencing a Mental Health or Behavioral Health Crisis or need someone to talk to, please call the Suicide and Crisis Lifeline: 988 call 911   The patient verbalized understanding of instructions, educational materials, and care plan provided today and DECLINED offer to receive copy of patient instructions, educational materials, and care plan.   Telephone follow up appointment with care management team member scheduled for: 07/14/23  Reece Levy, MSW, LCSW Clinical Social Worker [phone]

## 2023-07-12 DIAGNOSIS — G8929 Other chronic pain: Secondary | ICD-10-CM | POA: Insufficient documentation

## 2023-07-12 NOTE — Assessment & Plan Note (Signed)
Patient comes in for bilateral knee pain has been an issue for going on a year.  Patient notes pains been worse since his fall down the stairs, which was remote.  Patient had been working with PT, reports that balance and pain have gotten better, however pain is now worse.  Patient appreciates knee is throbbing, worse with activity, and causing him some balance issues.  Pain rated 7 out of 10.  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  Given that pain is in bilateral knees low concern for septic joint.  Most likely OA given length of symptoms, work history, and knee exam.  Low concern for a meniscal or ligamentous tear, given physical exam, despite history of falling downstairs in past. Will recommend OTC pain management, and x-ray imaging. - DG complete left and right knee - Voltaren gel, lidocaine patches prn

## 2023-07-14 ENCOUNTER — Ambulatory Visit (INDEPENDENT_AMBULATORY_CARE_PROVIDER_SITE_OTHER): Payer: 59 | Admitting: Neurology

## 2023-07-14 ENCOUNTER — Ambulatory Visit: Payer: Self-pay | Admitting: *Deleted

## 2023-07-14 ENCOUNTER — Encounter: Payer: Self-pay | Admitting: Neurology

## 2023-07-14 ENCOUNTER — Encounter: Payer: Self-pay | Admitting: *Deleted

## 2023-07-14 VITALS — BP 139/64 | HR 93 | Ht 71.0 in | Wt 175.0 lb

## 2023-07-14 DIAGNOSIS — G40909 Epilepsy, unspecified, not intractable, without status epilepticus: Secondary | ICD-10-CM

## 2023-07-14 DIAGNOSIS — S065XAA Traumatic subdural hemorrhage with loss of consciousness status unknown, initial encounter: Secondary | ICD-10-CM | POA: Diagnosis not present

## 2023-07-14 MED ORDER — LACOSAMIDE 100 MG PO TABS: 100.0000 mg | ORAL_TABLET | Freq: Two times a day (BID) | ORAL | 5 refills | Status: DC

## 2023-07-14 NOTE — Patient Outreach (Signed)
  Care Coordination   In Person Provider Office Visit Note   07/14/2023 Name: Jose Li MRN: [id number] DOB: [birth year]  Jose Li is a 67 y.o. year old male who sees Jose Kinds, MD for primary care. I  assisted with SCAT paperwork being completed again as the deadline has passed for the updates from PCP to be submitted.  What matters to the patients health and wellness today?  Transportation    Goals Addressed   None     SDOH assessments and interventions completed:  Yes     Care Coordination Interventions:  Yes, provided   Interventions Today    Flowsheet Row Most Recent Value  Chronic Disease   Chronic disease during today's visit Other  [TBI]  General Interventions   General Interventions Discussed/Reviewed Community Resources  Level of Care Public librarian Other  [SCAT application revised and provided to PCP for his portion/review and signature. CSW will submit to GTA]  Education Interventions   Applications Other  [SCAT application revised and provided to PCP for his portion/review and signature. CSW will submit to GTA]       Follow up plan: Follow up call scheduled for 08/07/23    Encounter Outcome:  Pt. Visit Completed

## 2023-07-14 NOTE — Progress Notes (Signed)
PATIENT: Jose Li DOB: 1956/05/07  REASON FOR VISIT: follow up for seizures HISTORY FROM: patient PRIMARY NEUROLOGIST: Dr. Teresa Li  HISTORY OF PRESENT ILLNESS: Today 07/14/23 Patient presents today for follow-up, he is alone.  Last visit was in February and since then he has been doing well until August 17 when he did have a breakthrough seizure.  He reports compliance with his medication, Keppra 1500 mg twice daily.  He did not go to the hospital after the seizure.  Denies any major injury.  He denies any triggering factor, did not get sick, was sleeping well and was taking this medication. His last Keppra level was within normal limits 63.5.   12/30/2022:  Here today for follow-up, admitted 06/07/2022 for left-sided subdural hematoma with craniotomy, his hospitalization was complicated by delirium.  Discharged to skilled nursing facility.  Admitted in October for orthostatic hypotension.  Follows with cardiology for autonomic dysfunction. Alone today, back at his home. He mentions the fall he had in July, he thinks he fell down the stairs, his girlfriend found him, went to the ER, found subdural hematoma. Remains on Keppra 1500 mg twice daily, Lyrica 75 mg twice daily. He wonders if his fall was due to seizure, he thinks he was missing doses of his medication at that time. He didn't have an ID to get the Lyrica at the time. He doesn't drive. He claims he is able to manage his own medications. Also seems cardiology for passing out spells. He is currently in PT/ST at his home. Under a lot of stress, managing money, financial. He denies any alcohol use. He goes to [facility] for his Bipolar Disorder. He does smack his lips, he says due to dentures slipping.   Update 12/29/21 SS: Jose Li is here today for follow-up for seizures. His father passed away last week at age of 50. Jose Li is going to move to an apartment. He is under a lot of stress. Remains on Lyrica and Keppra. At times he might feel a seizure  coming on, he will lay down. In the past has had grand mal seizures, can't remember the last time. Doesn't have a drivers license. No major health issues. Had a passing out spell back in November, December, with rib injury. On midodrine from cardiology.   Update 12/29/2020 SS: Jose Li is a 67 year old male with history of alcohol abuse and seizures.  Also history of orthostatic hypotension and OSA on BiPAP.  He remains on Keppra and Lyrica. No recurrent seizure.  He does not drive, he lives with his father who is 28.  He is followed by cardiology for orthostatic hypotension, denies any recent syncopal spells.  He has no complaints today.  His father drove him today, he is on disability, is not very active during the day, mostly stays home, takes care of cats.  Reports only occasional alcohol use.  Here today for evaluation unaccompanied.  HISTORY  11/13/2019 SS: Jose Li is a 67 year old male with history of alcohol abuse and history of seizures.  He also has history of orthostatic hypotension and OSA treated with BiPAP.  He remains on Keppra 750 mg tablets, 2 tablets twice a day.  He is also taking Lyrica 75 mg twice daily.  He reports he has not had recurrent seizure.  He says he has had 2 episodes of dizziness, likely related to his autonomic dysfunction, but no blackouts.  He says he is no longer drinking alcohol.  He does not drive a car.  He lives  with his father who is 39 years old.  He says he is able to manage his medications.  He indicates his overall health has been well.  He is on disability, he does not do much during the day.  He presents today for evaluation unaccompanied.  REVIEW OF SYSTEMS: Out of a complete 14 system review of symptoms, the patient complains only of the following symptoms, and all other reviewed systems are negative.  See HPI  ALLERGIES: Allergies  Allergen Reactions   Cymbalta [Duloxetine Hcl] Other (See Comments)    Hyponatremia    Erythromycin Other (See  Comments)    Unknown reation   Hctz [Hydrochlorothiazide] Other (See Comments)    Syncope   Inderal [Propranolol] Other (See Comments)    Syncope     HOME MEDICATIONS: Outpatient Medications Prior to Visit  Medication Sig Dispense Refill   acetaminophen (TYLENOL) 325 MG tablet Take 2 tablets (650 mg total) by mouth every 4 (four) hours as needed for mild pain (temp > 100.5). (Patient taking differently: Take 1,300 mg by mouth every 4 (four) hours as needed for mild pain (temp > 100.5). Arthritis strength)     aspirin 81 MG EC tablet Take 81 mg by mouth daily.     atorvastatin (LIPITOR) 20 MG tablet Take 1 tablet (20 mg total) by mouth daily. 90 tablet 3   benztropine (COGENTIN) 1 MG tablet Take 1 mg by mouth at bedtime.     Bismuth Subsalicylate (KAOPECTATE) 262 MG TABS Take 262 mg by mouth every 6 (six) hours as needed (Diarrhea).     bismuth subsalicylate (PEPTO BISMOL) 262 MG/15ML suspension Take 30 mLs by mouth every 6 (six) hours as needed for diarrhea or loose stools.     Boswellia-Glucosamine-Vit D (OSTEO BI-FLEX ONE PER DAY PO) Take 1 tablet by mouth daily.     busPIRone (BUSPAR) 5 MG tablet Take 5 mg by mouth 2 (two) times daily.      cetirizine (ZYRTEC) 10 MG tablet Take 10 mg by mouth daily.     citalopram (CELEXA) 20 MG tablet Take 7 mg by mouth daily.     Ensure (ENSURE) Take 237 mLs by mouth daily. Chocolate     finasteride (PROSCAR) 5 MG tablet Take 1 tablet (5 mg total) by mouth daily. 90 tablet 3   levETIRAcetam (KEPPRA) 750 MG tablet Take 2 tablets (1,500 mg total) by mouth 2 (two) times daily. 360 tablet 4   midodrine (PROAMATINE) 5 MG tablet TAKE 2 TABLETS BY MOUTH ONCE DAILY AT 6 AM, THEN TAKE 1 TABLET BY MOUTH AT 10 AM, THEN TAKE 1 TABLET AT 2 PM 360 tablet 3   Multiple Vitamin (MULTIVITAMIN) tablet Take 1 tablet by mouth daily.     potassium chloride (KLOR-CON M) 10 MEQ tablet Take 10 mEq by mouth daily.     potassium chloride (KLOR-CON) 10 MEQ tablet Take 10 mEq by  mouth daily.     pregabalin (LYRICA) 75 MG capsule Take 1 capsule (75 mg total) by mouth 2 (two) times daily. 180 capsule 1   risperiDONE (RISPERDAL) 2 MG tablet Take 2 mg by mouth at bedtime.      tamsulosin (FLOMAX) 0.4 MG CAPS capsule Take 0.4 mg by mouth daily after supper.     thiamine 250 MG tablet Take 250 mg by mouth daily.     traZODone (DESYREL) 100 MG tablet Take 200 mg by mouth at bedtime.     No facility-administered medications prior to visit.  PAST MEDICAL HISTORY: Past Medical History:  Diagnosis Date   Acute kidney injury (HCC) 05/26/2018   Allergy    Anemia    Anxiety    Arthritis    LEGS,BACK   Cataract    BILATERAL   CKD (chronic kidney disease), stage III (HCC)    Depression    History of kidney stones    Hyperlipidemia    Hypertension, essential, benign 08/02/2012   Orthostatic hypotension 10/09/2013   OSA (obstructive sleep apnea) 10/01/2015   Severe OSA with an AHI of 86/hr now on BiPAP at 23/19cm H2O   Pneumonia    Rhabdomyolysis 08/24/2022   Seizures (HCC)    none for last 6-31months   Sleep apnea    Substance abuse (HCC)     PAST SURGICAL HISTORY: Past Surgical History:  Procedure Laterality Date   COLONOSCOPY     CRANIOTOMY Left 06/08/2022   Procedure: CRANIOTOMY HEMATOMA EVACUATION SUBDURAL;  Surgeon: Julio Sicks, MD;  Location: [facility] OR;  Service: Neurosurgery;  Laterality: Left;   CYSTOSCOPY WITH RETROGRADE PYELOGRAM, URETEROSCOPY AND STENT PLACEMENT Right 03/29/2023   Procedure: CYSTOSCOPY WITH RETROGRADE PYELOGRAM, URETEROSCOPY AND STENT PLACEMENT;  Surgeon: [name], MD;  Location: [facility];  Service: Urology;  Laterality: Right;   EXTRACORPOREAL SHOCK WAVE LITHOTRIPSY Right 03/28/2023   Procedure: EXTRACORPOREAL SHOCK WAVE LITHOTRIPSY (ESWL);  Surgeon: [name], MD;  Location: [facility];  Service: Urology;  Laterality: Right;  Local anesthesia   FRACTURE SURGERY  1990   right hip and femur   HERNIA REPAIR   07/2009   umbilical hernia/ got infected   JOINT REPLACEMENT Right 1990   right hip   TRACHEOSTOMY  08/1999   Had pneumoniaand was in a coma 10 days   WISDOM TOOTH EXTRACTION      FAMILY HISTORY: Family History  Problem Relation Age of Onset   Heart disease Mother    Diabetes Father    Heart disease Brother    Hypertension Neg Hx    Stroke Neg Hx    Colon cancer Neg Hx    Colon polyps Neg Hx    Crohn's disease Neg Hx    Esophageal cancer Neg Hx    Rectal cancer Neg Hx    Stomach cancer Neg Hx    Ulcerative colitis Neg Hx     SOCIAL HISTORY: Social History   Socioeconomic History   Marital status: Divorced    Spouse name: Not on file   Number of children: 1   Years of education: 14   Highest education level: 12th grade  Occupational History   Occupation: diability  Tobacco Use   Smoking status: Every Day    Current packs/day: 1.00    Average packs/day: 1 pack/day for 30.0 years (30.0 ttl pk-yrs)    Types: Cigarettes    Passive exposure: Current   Smokeless tobacco: Never  Vaping Use   Vaping status: Never Used  Substance and Sexual Activity   Alcohol use: Not Currently    Comment: history of alcohol abuse.   Drug use: Not Currently    Comment: Hx of cocaine use "years ago"   Sexual activity: Not Currently  Other Topics Concern   Not on file  Social History Narrative   Patient lives alone. His father recently passed away.    A lot of stressors are coming from his passing.    Patient can not drive. Utilizes friends, taxis or medicare transportation.    Enjoys watching tv and working in  the yard.    Social Determinants of Health   Financial Resource Strain: High Risk (05/17/2023)   Overall Financial Resource Strain (CARDIA)    Difficulty of Paying Living Expenses: Hard  Food Insecurity: No Food Insecurity (05/17/2023)   Hunger Vital Sign    Worried About Running Out of Food in the Last Year: Never true    Ran Out of Food in the Last Year: Never true   Transportation Needs: No Transportation Needs (05/17/2023)   PRAPARE - Administrator, Civil Service (Medical): No    Lack of Transportation (Non-Medical): No  Physical Activity: Insufficiently Active (05/17/2023)   Exercise Vital Sign    Days of Exercise per Week: 3 days    Minutes of Exercise per Session: 20 min  Stress: Stress Concern Present (05/03/2023)   [employer] of Occupational Health - Occupational Stress Questionnaire    Feeling of Stress : To some extent  Social Connections: Unknown (05/17/2023)   Social Connection and Isolation Panel [NHANES]    Frequency of Communication with Friends and Family: More than three times a week    Frequency of Social Gatherings with Friends and Family: Three times a week    Attends Religious Services: Patient declined    Active Member of Clubs or Organizations: No    Attends Banker Meetings: Never    Marital Status: Divorced  Recent Concern: Social Connections - Socially Isolated (05/03/2023)   Social Connection and Isolation Panel [NHANES]    Frequency of Communication with Friends and Family: More than three times a week    Frequency of Social Gatherings with Friends and Family: More than three times a week    Attends Religious Services: Never    Database administrator or Organizations: No    Attends Banker Meetings: Never    Marital Status: Divorced  Catering manager Violence: Not At Risk (05/03/2023)   Humiliation, Afraid, Rape, and Kick questionnaire    Fear of Current or Ex-Partner: No    Emotionally Abused: No    Physically Abused: No    Sexually Abused: No   PHYSICAL EXAM  Vitals:   07/14/23 0949  BP: 139/64  Pulse: 93  Weight: 175 lb (79.4 kg)  Height: 5\' 11"  (1.803 m)    Body mass index is 24.41 kg/m.  Generalized: Well developed, in no acute distress  Neurological examination  Mentation: Alert oriented to time, place, history taking. Follows all commands speech and language  fluent. Quiet, speech has slight slur  Cranial nerve II-XII: Pupils were equal round reactive to light. Extraocular movements were full, visual field were full on confrontational test. Facial sensation and strength were normal. Head turning and shoulder shrug  were normal and symmetric. Noted to be smacking his lips claims to keep dentures in place  Motor: The motor testing reveals 5 over 5 strength of all 4 extremities. Good symmetric motor tone is noted throughout.  Sensory: Sensory testing is intact to soft touch on all 4 extremities. No evidence of extinction is noted.  Coordination: Cerebellar testing reveals good finger-nose-finger and heel-to-shin bilaterally.  Gait and station: Gait is normal. Tandem gait is unsteady Reflexes: Deep tendon reflexes are symmetric and normal bilaterally.   DIAGNOSTIC DATA (LABS, IMAGING, TESTING) - I reviewed patient records, labs, notes, testing and imaging myself where available.  Lab Results  Component Value Date   WBC 6.2 03/22/2023   HGB 12.0 (L) 03/22/2023   HCT 37.2 (L) 03/22/2023   MCV 87.5  03/22/2023   PLT 266 03/22/2023      Component Value Date/Time   NA 136 03/22/2023 1339   NA 142 02/15/2023 1356   K 4.1 03/22/2023 1339   CL 108 03/22/2023 1339   CO2 23 03/22/2023 1339   GLUCOSE 99 03/22/2023 1339   BUN 14 03/22/2023 1339   BUN 11 02/15/2023 1356   CREATININE 1.23 03/22/2023 1339   CREATININE 0.94 06/10/2016 1613   CALCIUM 8.5 (L) 03/22/2023 1339   PROT 7.0 12/30/2022 1107   ALBUMIN 4.5 12/30/2022 1107   AST 17 12/30/2022 1107   ALT 14 12/30/2022 1107   ALKPHOS 105 12/30/2022 1107   BILITOT 0.4 12/30/2022 1107   GFRNONAA >60 03/22/2023 1339   GFRNONAA 84 02/17/2016 1011   GFRAA 65 10/17/2020 1013   GFRAA >89 02/17/2016 1011   Lab Results  Component Value Date   CHOL 134 12/13/2022   HDL 36 (L) 12/13/2022   LDLCALC 72 12/13/2022   TRIG 146 12/13/2022   CHOLHDL 3.7 12/13/2022   Lab Results  Component Value Date    HGBA1C 4.6 (L) 06/12/2022   Lab Results  Component Value Date   VITAMINB12 473 02/06/2016   Lab Results  Component Value Date   TSH 1.518 02/06/2016   EEG 01/17/2023 Frequent left temporal sharps  Left temporal slowing    ASSESSMENT AND PLAN 67 y.o. year old male  has a past medical history of Acute kidney injury (HCC) (05/26/2018), Allergy, Anemia, Anxiety, Arthritis, Cataract, CKD (chronic kidney disease), stage III (HCC), Depression, History of kidney stones, Hyperlipidemia, Hypertension, essential, benign (08/02/2012), Orthostatic hypotension (10/09/2013), OSA (obstructive sleep apnea) (10/01/2015), Pneumonia, Rhabdomyolysis (08/24/2022), Seizures (HCC), Sleep apnea, and Substance abuse (HCC). here with:  1.  Seizure disorder 2.  Orthostatic hypotension 3.  Subdural hematoma post craniotomy July 2023 4.  Bipolar disorder   Patient Instructions  Continue with Keppra 1500 mg twice daily  Start with Vimpat 100 mg twice daily  Continue your other medications  Please call the office if you do have another seizure Follow with Maralyn Sago in 6 months    Meds ordered this encounter  Medications   Lacosamide (VIMPAT) 100 MG TABS    Sig: Take 1 tablet (100 mg total) by mouth 2 (two) times daily.    Dispense:  60 tablet    Refill:  5   No orders of the defined types were placed in this encounter.  Windell Norfolk, MD 07/14/2023, 11:11 AM [facility] [address], [address] [place], [place] [address] [phone]

## 2023-07-14 NOTE — Patient Instructions (Addendum)
Continue with Keppra 1500 mg twice daily  Start with Vimpat 100 mg twice daily  Continue your other medications  Please call the office if you do have another seizure Follow with Maralyn Sago in 6 months

## 2023-07-15 ENCOUNTER — Ambulatory Visit: Payer: Self-pay

## 2023-07-16 NOTE — Patient Outreach (Signed)
  Care Coordination   Follow Up Visit Note   07/15/23 Name: Jose Li MRN: [id number] DOB: 07-13-1956  Jose Li is a 67 y.o. year old male who sees Bess Kinds, MD for primary care. I spoke with  Jose Li by phone today.  What matters to the patients health and wellness today?  Jose Li is currently in stable condition with a blood pressure reading of 139/64 obtained yesterday. He reported an incident of falling without sustaining any physical injuries but suspects that he may have experienced a seizure. Following his appointment at the Neurology department, he started a new medication regimen, specifically Vimpat 100 mg BID. He has responded well to this medication and has remained free from any complications since taking the medication.      Goals Addressed   None     SDOH assessments and interventions completed:  No     Care Coordination Interventions:  Yes, provided   Interventions Today    Flowsheet Row Most Recent Value  Chronic Disease   Chronic disease during today's visit Hypertension (HTN)  [Seizure]  General Interventions   General Interventions Discussed/Reviewed General Interventions Discussed  Pharmacy Interventions   Pharmacy Dicussed/Reviewed Pharmacy Topics Discussed  Safety Interventions   Safety Discussed/Reviewed Safety Reviewed        Follow up plan: Follow up call scheduled for 08/16/23  1130    Encounter Outcome:  Pt. Visit Completed   Juanell Fairly RN, BSN, [facility]   Care Coordinator Phone: [phone]

## 2023-07-16 NOTE — Patient Instructions (Signed)
Visit Information  Thank you for taking time to visit with me today. Please don't hesitate to contact me if I can be of assistance to you.   Following are the goals we discussed today:   Goals Addressed   None     Our next appointment is by telephone on 08/16/23 at 1130 am  Please call the care guide team at [phone] if you need to cancel or reschedule your appointment.   If you are experiencing a Mental Health or Behavioral Health Crisis or need someone to talk to, please call 1-800-273-TALK (toll free, 24 hour hotline)  Patient verbalizes understanding of instructions and care plan provided today and agrees to view in MyChart. Active MyChart status and patient understanding of how to access instructions and care plan via MyChart confirmed with patient.     Juanell Fairly RN, BSN, [facility] [employer]er/designer Phone: [phone]

## 2023-07-28 ENCOUNTER — Ambulatory Visit: Payer: Self-pay | Admitting: *Deleted

## 2023-07-29 NOTE — Patient Outreach (Signed)
Care Coordination   Follow Up Visit Note   07/29/2023 Name: JORDACHE MCELRAVY MRN: [id number] DOB: 05/16/1956  [name] is a 67 y.o. year old male who sees Bess Kinds, MD for primary care. I spoke with  [name] by phone today.  What matters to the patients health and wellness today?  Updated pt on SCAT application status    Goals Addressed             This Visit's Progress    Seek opportunities to enhance quality of life       Care Coordination Interventions and Goals: SCAT application has been revised and submitted- GTA indicates review later this month for determination Solution-Focused Strategies employed Active listening / Reflection utilized  Problem Solving /Task Center strategies reviewed Provided general psycho-education for mental health needs  Reviewed mental health medications and discussed importance of compliance:  Participation in counseling encouraged : pt declines at this time Participation in support group encouraged : declines at this time Consider volunteer opportunities   Transportation provided by insurance provider ( )  Continue to remain alcohol free- great job         SDOH assessments and interventions completed:  Yes     Care Coordination Interventions:  Yes, provided    Follow up plan: Follow up call scheduled for 08/17/23    Encounter Outcome:  Patient Visit Completed

## 2023-08-16 ENCOUNTER — Ambulatory Visit: Payer: Self-pay

## 2023-08-16 NOTE — Patient Outreach (Signed)
Care Coordination   Follow Up Visit Note   08/16/2023 Name: Jose Li MRN: [id number] DOB: August 06, 1956  Jose Li is a 67 y.o. year old male who sees Bess Kinds, MD for primary care. I spoke with  Jose Li by phone today.  What matters to the patients health and wellness today?  Mr. Cowfer mentioned that he has been having trouble sleeping and is experiencing pain in his right side and kidneys. He suspects that he may have two kidney stones, as he has been unable to urinate a lot. He is scheduled to see his nephrologist tomorrow to determine the next steps. He describes the pain as a six out of ten, and it is very uncomfortable. He has been staying hydrated by drinking plenty of fluids, including water and Gatorade. Since his last seizure, he has been taking his medications as prescribed and has not experienced any further seizures. Additionally, he has not had any issues with his blood pressure and has not been experiencing headaches or chest pain.    Goals Addressed                        I had to seizure and possilble kidney stones       Patient Goals/Self Care Activities: -Patient/Caregiver will take medications as prescribed   -Patient/Caregiver will attend all scheduled provider appointments -Patient/Caregiver will call pharmacy for medication refills 3-7 days in advance of running out of medications -Patient/Caregiver will call provider office for new concerns or questions  -Patient/Caregiver will focus on medication adherence by taking medications as prescribed   Evaluation of current treatment plan related to  patient's adherence to plan as established by provider Reviewed scheduled/upcoming provider appointments  Call 911 Seizure with loss of consciousness longer than 5 minutes, not responding to rescue med if available Repeated seizures longer than 10 minutes, no recovery between  them, not responding to rescue med if available Difficulty breathing after  seizure Serious injury occurs or suspected, seizure in water Call Physician Change in seizure type, number or pattern Person does not return to usual behavior (i.e., confused for a long period) First time seizure that stops on its' own Other medical problems For kidney stone prevention Drinking plenty of water. Limiting animal proteins. Limiting foods high in sugar and sodium. Limiting foods high in oxalates. If you have calcium oxalate stones, your provider might recommend you avoid foods like spinach, rhubarb, wheat bran, tree nuts and peanuts. Maintaining a weight that's healthy for you. Eating foods that are good sources of calcium.         SDOH assessments and interventions completed:  No     Care Coordination Interventions:  Yes, provided   Interventions Today    Flowsheet Row Most Recent Value  Chronic Disease   Chronic disease during today's visit Other  General Interventions   General Interventions Discussed/Reviewed General Interventions Discussed, General Interventions Reviewed  Nutrition Interventions   Nutrition Discussed/Reviewed Fluid intake  Pharmacy Interventions   Pharmacy Dicussed/Reviewed Pharmacy Topics Discussed  Safety Interventions   Safety Discussed/Reviewed Safety Reviewed, Fall Risk        Follow up plan: Follow up call scheduled for 08/20/23  1030 am    Encounter Outcome:  Patient Visit Completed   Juanell Fairly RN, BSN, [facility]   Care Coordinator Phone: [phone]

## 2023-08-16 NOTE — Patient Instructions (Signed)
Visit Information  Thank you for taking time to visit with me today. Please don't hesitate to contact me if I can be of assistance to you.   Following are the goals we discussed today:   Goals Addressed             This Visit's Progress    COMPLETED: Care Coordination Activities-The patient needs has dizziness       Patient Goals/Self Care Activities: -Patient/Caregiver will self-administer medications as prescribed as evidenced by self-report/primary caregiver report  -Patient/Caregiver will attend all scheduled provider appointments as evidenced by clinician review of documented attendance to scheduled appointments and patient/caregiver report -Patient/Caregiver will focus on medication adherence by taking medications as prescribed  Care Coordination Interventions: Evaluation of current treatment plan related to dizziness and patient's adherence to plan as established by provider Advised patient to drink more fluids and eat more food.  Try six small meals a day if possible Reviewed medications with patient and discussed importance.      I had to seizure and possilble kidney stones       Patient Goals/Self Care Activities: -Patient/Caregiver will take medications as prescribed   -Patient/Caregiver will attend all scheduled provider appointments -Patient/Caregiver will call pharmacy for medication refills 3-7 days in advance of running out of medications -Patient/Caregiver will call provider office for new concerns or questions  -Patient/Caregiver will focus on medication adherence by taking medications as prescribed   Evaluation of current treatment plan related to  patient's adherence to plan as established by provider Reviewed scheduled/upcoming provider appointments  Call 911 Seizure with loss of consciousness longer than 5 minutes, not responding to rescue med if available Repeated seizures longer than 10 minutes, no recovery between  them, not responding to rescue med if  available Difficulty breathing after seizure Serious injury occurs or suspected, seizure in water Call Physician Change in seizure type, number or pattern Person does not return to usual behavior (i.e., confused for a long period) First time seizure that stops on its' own Other medical problems For kidney stone prevention Drinking plenty of water. Limiting animal proteins. Limiting foods high in sugar and sodium. Limiting foods high in oxalates. If you have calcium oxalate stones, your provider might recommend you avoid foods like spinach, rhubarb, wheat bran, tree nuts and peanuts. Maintaining a weight that's healthy for you. Eating foods that are good sources of calcium.         Our next appointment is by telephone on 08/20/23 at 1030 am  Please call the care guide team at [phone] if you need to cancel or reschedule your appointment.   If you are experiencing a Mental Health or Behavioral Health Crisis or need someone to talk to, please call 1-800-273-TALK (toll free, 24 hour hotline)  Patient verbalizes understanding of instructions and care plan provided today and agrees to view in MyChart. Active MyChart status and patient understanding of how to access instructions and care plan via MyChart confirmed with patient.     Juanell Fairly RN, BSN, [facility] [employer] Phone: [phone]

## 2023-08-17 ENCOUNTER — Ambulatory Visit (INDEPENDENT_AMBULATORY_CARE_PROVIDER_SITE_OTHER): Payer: 59 | Admitting: Urology

## 2023-08-17 ENCOUNTER — Ambulatory Visit: Payer: Self-pay | Admitting: *Deleted

## 2023-08-17 ENCOUNTER — Encounter: Payer: Self-pay | Admitting: Urology

## 2023-08-17 VITALS — BP 123/74 | HR 73 | Ht 71.0 in | Wt 170.0 lb

## 2023-08-17 DIAGNOSIS — N200 Calculus of kidney: Secondary | ICD-10-CM

## 2023-08-17 DIAGNOSIS — Z87442 Personal history of urinary calculi: Secondary | ICD-10-CM | POA: Diagnosis not present

## 2023-08-17 DIAGNOSIS — N401 Enlarged prostate with lower urinary tract symptoms: Secondary | ICD-10-CM | POA: Diagnosis not present

## 2023-08-17 LAB — URINALYSIS, ROUTINE W REFLEX MICROSCOPIC
Bilirubin, UA: NEGATIVE
Glucose, UA: NEGATIVE
Ketones, UA: NEGATIVE
Leukocytes,UA: NEGATIVE
Nitrite, UA: NEGATIVE
Protein,UA: NEGATIVE
RBC, UA: NEGATIVE
Specific Gravity, UA: 1.02 (ref 1.005–1.030)
Urobilinogen, Ur: 0.2 mg/dL (ref 0.2–1.0)
pH, UA: 6.5 (ref 5.0–7.5)

## 2023-08-17 NOTE — Patient Outreach (Signed)
Care Coordination   Follow Up Visit Note   08/17/2023 Name: Jose Li MRN: [id number] DOB: 07-13-1956  Jose Li is a 67 y.o. year old male who sees Jose Kinds, MD for primary care. I spoke with  Jose Li by phone today.  What matters to the patients health and wellness today?  Got approved for [employer] transportation    Goals Addressed             This Visit's Progress    COMPLETED: Seek opportunities to enhance quality of life       Care Coordination Interventions and Goals: 'Utilize SC[employer] now that you are approved - Solution-Focused Strategies employed Active listening / Reflection utilized  Problem Solving /Task Center strategies reviewed Provided general psycho-education for mental health needs  Reviewed mental health medications and discussed importance of compliance:  Participation in counseling encouraged : pt declines at this time Participation in support group encouraged : declines at this time Consider volunteer opportunities   Transportation provided by insurance provider ( )  Continue to remain alcohol free- great job         SDOH assessments and interventions completed:  Yes     Care Coordination Interventions:  Yes, provided  Interventions Today    Flowsheet Row Most Recent Value  Chronic Disease   Chronic disease during today's visit Other  [Pt reports he went to the kidney MD today- all was good]  General Interventions   General Interventions Discussed/Reviewed [employer] has been approved to use [employer]- encouraged pt to think about fun places to go and GO!]  Mental Health Interventions   Mental Health Discussed/Reviewed Mental Health Discussed       Follow up plan: No further intervention required.   Encounter Outcome:  Patient Visit Completed

## 2023-08-17 NOTE — Patient Instructions (Signed)
Visit Information  Thank you for taking time to visit with me today. Please don't hesitate to contact me if I can be of assistance to you.   Following are the goals we discussed today:   Goals Addressed             This Visit's Progress    COMPLETED: Seek opportunities to enhance quality of life       Care Coordination Interventions and Goals: 'Utilize [employer] now that you are approved - Solution-Focused Strategies employed Active listening / Reflection utilized  Problem Solving /Task Center strategies reviewed Provided general psycho-education for mental health needs  Reviewed mental health medications and discussed importance of compliance:  Participation in counseling encouraged : pt declines at this time Participation in support group encouraged : declines at this time Consider volunteer opportunities   Transportation provided by insurance provider ( )  Continue to remain alcohol free- great job        Reach out if need arise- take care,  Reece Levy, MSW, LCSW Clinical Social Worker [phone]

## 2023-08-17 NOTE — Progress Notes (Signed)
Assessment: 1. Nephrolithiasis   2. Benign localized prostatic hyperplasia with lower urinary tract symptoms (LUTS)     Plan: Patient will continue tamsulosin and finasteride for his BPH.  Follow-up 6 months for recheck  Concerning his recent right sided flank pain which appears to have resolved he may have passed a small stone.  At this time he would like to see how this plays out over the next week or so.  If he has any further right sided pain concerning for stone will obtain CT stone study.  Chief Complaint: nephrolithiasis  HPI: Jose Li is a 67 y.o. male who presents for continued evaluation of BPH and nephrolithiasis. Patient is on combination medical therapy with tamsulosin and finasteride reports stable lower urinary tract symptoms.  Patient states that approximately 2 weeks ago he began having some intermittent right sided colicky type flank pain concerning for possible stone.  He made an appointment for evaluation but has not had any further pain over the last 4 days. Urinalysis today is entirely negative with no microscopic hematuria.   Patient is status post ureteroscopic management of a right proximal ureteral stone 03/29/2023.  This was found incidentally on a CT hematuria protocol for microscopic hematuria evaluation.  Portions of the above documentation were copied from a prior visit for review purposes only.  Allergies: Allergies  Allergen Reactions   Cymbalta [Duloxetine Hcl] Other (See Comments)    Hyponatremia    Erythromycin Other (See Comments)    Unknown reation   Hctz [Hydrochlorothiazide] Other (See Comments)    Syncope   Inderal [Propranolol] Other (See Comments)    Syncope     PMH: Past Medical History:  Diagnosis Date   Acute kidney injury (HCC) 05/26/2018   Allergy    Anemia    Anxiety    Arthritis    LEGS,BACK   Cataract    BILATERAL   CKD (chronic kidney disease), stage III (HCC)    Depression    History of kidney stones     Hyperlipidemia    Hypertension, essential, benign 08/02/2012   Orthostatic hypotension 10/09/2013   OSA (obstructive sleep apnea) 10/01/2015   Severe OSA with an AHI of 86/hr now on BiPAP at 23/19cm H2O   Pneumonia    Rhabdomyolysis 08/24/2022   Seizures (HCC)    none for last 6-23months   Sleep apnea    Substance abuse (HCC)     PSH: Past Surgical History:  Procedure Laterality Date   COLONOSCOPY     CRANIOTOMY Left 06/08/2022   Procedure: CRANIOTOMY HEMATOMA EVACUATION SUBDURAL;  Surgeon: Julio Sicks, MD;  Location: [facility] OR;  Service: Neurosurgery;  Laterality: Left;   CYSTOSCOPY WITH RETROGRADE PYELOGRAM, URETEROSCOPY AND STENT PLACEMENT Right 03/29/2023   Procedure: CYSTOSCOPY WITH RETROGRADE PYELOGRAM, URETEROSCOPY AND STENT PLACEMENT;  Surgeon: [name], MD;  Location: [facility];  Service: Urology;  Laterality: Right;   EXTRACORPOREAL SHOCK WAVE LITHOTRIPSY Right 03/28/2023   Procedure: EXTRACORPOREAL SHOCK WAVE LITHOTRIPSY (ESWL);  Surgeon: [name], MD;  [facility];  Service: Urology;  Laterality: Right;  Local anesthesia   FRACTURE SURGERY  1990   right hip and femur   HERNIA REPAIR  07/2009   umbilical hernia/ got infected   JOINT REPLACEMENT Right 1990   right hip   TRACHEOSTOMY  08/1999   Had pneumoniaand was in a coma 10 days   WISDOM TOOTH EXTRACTION      SH: Social History   Tobacco Use  Smoking status: Every Day    Current packs/day: 1.00    Average packs/day: 1 pack/day for 30.0 years (30.0 ttl pk-yrs)    Types: Cigarettes    Passive exposure: Current   Smokeless tobacco: Never  Vaping Use   Vaping status: Never Used  Substance Use Topics   Alcohol use: Not Currently    Comment: history of alcohol abuse.   Drug use: Not Currently    Comment: Hx of cocaine use "years ago"    ROS: Constitutional:  Negative for fever, chills, weight loss CV: Negative for chest pain, previous MI, hypertension Respiratory:   Negative for shortness of breath, wheezing, sleep apnea, frequent cough GI:  Negative for nausea, vomiting, bloody stool, GERD  PE: BP 123/74   Pulse 73   Ht 5\' 11"  (1.803 m)   Wt 170 lb (77.1 kg)   BMI 23.71 kg/m  GENERAL APPEARANCE:  Well appearing, well developed, well nourished, NAD HEENT:  Atraumatic, normocephalic, oropharynx clear ABDOMEN:  Soft, non-tender, no masses EXTREMITIES:  Moves all extremities well, without clubbing, cyanosis, or edema NEUROLOGIC:  Alert and oriented x 3, normal gait, CN II-XII grossly intact MENTAL STATUS:  appropriate BACK:  Non-tender to palpation, No CVAT SKIN:  Warm, dry, and intact   Results: UA is entirely clear

## 2023-08-30 ENCOUNTER — Ambulatory Visit: Payer: Self-pay

## 2023-08-30 NOTE — Patient Instructions (Signed)
Visit Information  Thank you for taking time to visit with me today. Please don't hesitate to contact me if I can be of assistance to you.   Following are the goals we discussed today:   Goals Addressed             This Visit's Progress    COMPLETED: I had a seizure and possilble kidney stones       Patient Goals/Self Care Activities: -Patient/Caregiver will take medications as prescribed   -Patient/Caregiver will attend all scheduled provider appointments -Patient/Caregiver will call pharmacy for medication refills 3-7 days in advance of running out of medications -Patient/Caregiver will call provider office for new concerns or questions  -Patient/Caregiver will focus on medication adherence by taking medications as prescribed   Evaluation of current treatment plan related to  patient's adherence to plan as established by provider Reviewed scheduled/upcoming provider appointments  Call 911 Seizure with loss of consciousness longer than 5 minutes, not responding to rescue med if available Repeated seizures longer than 10 minutes, no recovery between  them, not responding to rescue med if available Difficulty breathing after seizure Serious injury occurs or suspected, seizure in water Call Physician Change in seizure type, number or pattern Person does not return to usual behavior (i.e., confused for a long period) First time seizure that stops on its' own Other medical problems For kidney stone prevention Drinking plenty of water. Limiting animal proteins. Limiting foods high in sugar and sodium. Limiting foods high in oxalates. If you have calcium oxalate stones, your provider might recommend you avoid foods like spinach, rhubarb, wheat bran, tree nuts and peanuts. Maintaining a weight that's healthy for you. Eating foods that are good sources of calcium.         Our next appointment is by telephone on 09/30/23 at 10 am  Please call the care guide team at [phone]  if you need to cancel or reschedule your appointment.   If you are experiencing a Mental Health or Behavioral Health Crisis or need someone to talk to, please call 1-800-273-TALK (toll free, 24 hour hotline)  Patient verbalizes understanding of instructions and care plan provided today and agrees to view in MyChart. Active MyChart status and patient understanding of how to access instructions and care plan via MyChart confirmed with patient.     Juanell Fairly RN, BSN, [facility] [employer] Phone: [phone]

## 2023-08-30 NOTE — Patient Outreach (Signed)
Care Coordination   Follow Up Visit Note   08/30/2023 Name: SANTEE VERAS MRN: [id number] DOB: [birth year]  [name] is a 67 y.o. year old male who sees Bess Kinds, MD for primary care. I spoke with  [name] by phone today.  What matters to the patients health and wellness today?  Mr. Gemberling mentioned that he went to his urology appointment and they found two small kidney stones. The doctors assured him that it should be okay and that he does not have to return for six months. He mentioned that he is experiencing some pain, rating it at 4 out of 10. His blood pressure has been stable at 123/74. He has not experienced any falls, his appetite is fair, and he is taking his medication as prescribed.    Goals Addressed             This Visit's Progress    COMPLETED: I had a seizure and possilble kidney stones       Patient Goals/Self Care Activities: -Patient/Caregiver will take medications as prescribed   -Patient/Caregiver will attend all scheduled provider appointments -Patient/Caregiver will call pharmacy for medication refills 3-7 days in advance of running out of medications -Patient/Caregiver will call provider office for new concerns or questions  -Patient/Caregiver will focus on medication adherence by taking medications as prescribed   Evaluation of current treatment plan related to  patient's adherence to plan as established by provider Reviewed scheduled/upcoming provider appointments  Call 911 Seizure with loss of consciousness longer than 5 minutes, not responding to rescue med if available Repeated seizures longer than 10 minutes, no recovery between  them, not responding to rescue med if available Difficulty breathing after seizure Serious injury occurs or suspected, seizure in water Call Physician Change in seizure type, number or pattern Person does not return to usual behavior (i.e., confused for a long period) First time seizure that stops on its'  own Other medical problems For kidney stone prevention Drinking plenty of water. Limiting animal proteins. Limiting foods high in sugar and sodium. Limiting foods high in oxalates. If you have calcium oxalate stones, your provider might recommend you avoid foods like spinach, rhubarb, wheat bran, tree nuts and peanuts. Maintaining a weight that's healthy for you. Eating foods that are good sources of calcium.         SDOH assessments and interventions completed:  No     Care Coordination Interventions:  Yes, provided   Interventions Today    Flowsheet Row Most Recent Value  Chronic Disease   Chronic disease during today's visit Hypertension (HTN), Other  [Kidney stones]  General Interventions   General Interventions Discussed/Reviewed General Interventions Discussed  Nutrition Interventions   Nutrition Discussed/Reviewed Nutrition Discussed  Pharmacy Interventions   Pharmacy Dicussed/Reviewed Pharmacy Topics Discussed  Safety Interventions   Safety Discussed/Reviewed Safety Discussed, Home Safety        Follow up plan: Follow up call scheduled for 09/30/23 10 am    Encounter Outcome:  Patient Visit Completed   Juanell Fairly RN, BSN, [facility]   Care Coordinator Phone: [phone]

## 2023-09-02 ENCOUNTER — Other Ambulatory Visit: Payer: Self-pay | Admitting: Neurology

## 2023-09-05 NOTE — Telephone Encounter (Signed)
Requested Prescriptions   Pending Prescriptions Disp Refills   pregabalin (LYRICA) 75 MG capsule [Pharmacy Med Name: Pregabalin 75 MG Oral Capsule] 180 capsule     Sig: TAKE 1 CAPSULE BY MOUTH TWICE  DAILY   Last seen 07/14/23 Next appt 02/01/24 Dispenses   Dispensed Days Supply Quantity Provider Pharmacy  PREGABALIN  75 MG CAPS 05/28/2023 90 180 capsule Glean Salvo, NP [facility]  PREGABALIN  75 MG CAPS 02/12/2023 90 180 capsule Glean Salvo, NP [facility] ...  PREGABALIN 75MG  CAP 12/11/2022 90 180 each Glean Salvo, NP [facility] [phone] .Marland KitchenMarland Kitchen

## 2023-09-19 ENCOUNTER — Other Ambulatory Visit: Payer: Self-pay | Admitting: Internal Medicine

## 2023-09-30 ENCOUNTER — Other Ambulatory Visit: Payer: Self-pay | Admitting: *Deleted

## 2023-09-30 ENCOUNTER — Encounter: Payer: Self-pay | Admitting: *Deleted

## 2023-09-30 NOTE — Patient Outreach (Signed)
Care Management   Visit Note  09/30/2023 Name: Jose Li MRN: [id number] DOB: 1956-06-04  Subjective: Jose Li is a 67 y.o. year old male who is a primary care patient of Bess Kinds, MD. The Care Management team was consulted for assistance.      Engaged with patient spoke with patient by telephone.    Goals Addressed             This Visit's Progress    RNCM Care Management Expected Outcome: Monitor, Self-Manage and Reduce Symptoms of: HTN, BPH       Current Barriers:  Knowledge Deficits related to plan of care for management of HTN and BPH  Chronic Disease Management support and education needs related to HTN and BPH  Patient expressed that he has not been feeling well since Monday. States he has been having nausea and diarrhea. He states he thinks he has the "stomach flu" and has been hydrating with Gatorade because it has been difficult to keep anything down. He states he is eating soups to have some type of nutrition. RNCM advised if symptoms worsened, he would need to follow up with his PCP.  RNCM Clinical Goal(s):  Patient will verbalize basic understanding of  HTN and BPH disease process and self health management plan as evidenced by verbal explanation, recognizing symptoms and continued lifestyle modifications  take all medications exactly as prescribed and will call provider for medication related questions as evidenced by compliance with all medications attend all scheduled medical appointments: with primary care provider and specialist as evidenced by keeping all scheduled appointments continue to work with RN Care Manager to address care management and care coordination needs related to  HTN and BPH as evidenced by adherence to CM Team Scheduled appointments through collaboration with RN Care manager, provider, and care team.   Interventions: Evaluation of current treatment plan related to  self management and patient's adherence to plan as established by  provider   BPH  (Status:  New goal. and Goal on track:  Yes.)  Long Term Goal Evaluation of current treatment plan related to BPH, self-management and patient's adherence to plan as established by provider. Patient states the passed a kidney stone approximately a month ago and has no current issues at this time with voiding or pain. Discussed plans with patient for ongoing care management follow up and provided patient with direct contact information for care management team Discussed the importance of regular follow-ups Encouraged a diet rich in fruits, vegetables, and whole grains.Limit caffeine and alcohol intake. Advised patient to manage fluid intake Promote regular physical activity to improve overall health   Hypertension Interventions:  (Status:  New goal. and Goal on track:  Yes.) Long Term Goal Last practice recorded BP readings:  BP Readings from Last 3 Encounters:  08/17/23 123/74  07/14/23 139/64  05/26/23 127/74   Most recent eGFR/CrCl:  Lab Results  Component Value Date   EGFR 65 02/15/2023    No components found for: "CRCL"  Evaluation of current treatment plan related to hypertension self management and patient's adherence to plan as established by provider. Patient does not regularly check his blood pressure and states he is aware he should check it more frequently. RNCM advised to try to start out with 3x/week but daily would be ideal. He does endorse dizziness with position changes and verbalized full understanding that he should change positions slowly. He states he fell about a month ago due to changing positions quickly but attributed the  fall to his knee pain. He states he did not have an injury with the fall. Provided education to patient re: stroke prevention, s/s of heart attack and stroke Reviewed medications with patient and discussed importance of compliance Counseled on adverse effects of illicit drug and excessive alcohol use in patients with high blood  pressure  Counseled on the importance of exercise goals with target of 150 minutes per week Discussed plans with patient for ongoing care management follow up and provided patient with direct contact information for care management team Advised patient, providing education and rationale, to monitor blood pressure daily and record, calling PCP for findings outside established parameters Reviewed scheduled/upcoming provider appointments including:  Discussed complications of poorly controlled blood pressure such as heart disease, stroke, circulatory complications, vision complications, kidney impairment, sexual dysfunction Screening for signs and symptoms of depression related to chronic disease state   Patient Goals/Self-Care Activities: Take all medications as prescribed Attend all scheduled provider appointments Call pharmacy for medication refills 3-7 days in advance of running out of medications Attend church or other social activities Call provider office for new concerns or questions  check blood pressure 3 times per week write blood pressure results in a log or diary call doctor for signs and symptoms of high blood pressure begin an exercise program eat more whole grains, fruits and vegetables, lean meats and healthy fats  Follow Up Plan:  Telephone follow up appointment with care management team member scheduled for:  10-28-2023 at 10:00 am           Consent to Services:  Patient was given information about care management services, agreed to services, and gave verbal consent to participate.   Plan: Telephone follow up appointment with care management team member scheduled for: 10-28-2023 at 10:00 am  Danise Edge, BSN RN RN Care Manager  [facility]  Ambulatory Care Management  Direct Number: [phone]

## 2023-09-30 NOTE — Patient Instructions (Signed)
Visit Information  Thank you for taking time to visit with me today. Please don't hesitate to contact me if I can be of assistance to you before our next scheduled telephone appointment.  Following are the goals we discussed today:   Goals Addressed             This Visit's Progress    RNCM Care Management Expected Outcome: Monitor, Self-Manage and Reduce Symptoms of: HTN, BPH       Current Barriers:  Knowledge Deficits related to plan of care for management of HTN and BPH  Chronic Disease Management support and education needs related to HTN and BPH  Patient expressed that he has not been feeling well since Monday. States he has been having nausea and diarrhea. He states he thinks he has the "stomach flu" and has been hydrating with Gatorade because it has been difficult to keep anything down. He states he is eating soups to have some type of nutrition. RNCM advised if symptoms worsened, he would need to follow up with his PCP.  RNCM Clinical Goal(s):  Patient will verbalize basic understanding of  HTN and BPH disease process and self health management plan as evidenced by verbal explanation, recognizing symptoms and continued lifestyle modifications  take all medications exactly as prescribed and will call provider for medication related questions as evidenced by compliance with all medications attend all scheduled medical appointments: with primary care provider and specialist as evidenced by keeping all scheduled appointments continue to work with RN Care Manager to address care management and care coordination needs related to  HTN and BPH as evidenced by adherence to CM Team Scheduled appointments through collaboration with RN Care manager, provider, and care team.   Interventions: Evaluation of current treatment plan related to  self management and patient's adherence to plan as established by provider   BPH  (Status:  New goal. and Goal on track:  Yes.)  Long Term Goal Evaluation  of current treatment plan related to BPH, self-management and patient's adherence to plan as established by provider. Patient states the passed a kidney stone approximately a month ago and has no current issues at this time with voiding or pain. Discussed plans with patient for ongoing care management follow up and provided patient with direct contact information for care management team Discussed the importance of regular follow-ups Encouraged a diet rich in fruits, vegetables, and whole grains.Limit caffeine and alcohol intake. Advised patient to manage fluid intake Promote regular physical activity to improve overall health   Hypertension Interventions:  (Status:  New goal. and Goal on track:  Yes.) Long Term Goal Last practice recorded BP readings:  BP Readings from Last 3 Encounters:  08/17/23 123/74  07/14/23 139/64  05/26/23 127/74   Most recent eGFR/CrCl:  Lab Results  Component Value Date   EGFR 65 02/15/2023    No components found for: "CRCL"  Evaluation of current treatment plan related to hypertension self management and patient's adherence to plan as established by provider. Patient does not regularly check his blood pressure and states he is aware he should check it more frequently. RNCM advised to try to start out with 3x/week but daily would be ideal. He does endorse dizziness with position changes and verbalized full understanding that he should change positions slowly. He states he fell about a month ago due to changing positions quickly but attributed the fall to his knee pain. He states he did not have an injury with the fall. Provided education to  patient re: stroke prevention, s/s of heart attack and stroke Reviewed medications with patient and discussed importance of compliance Counseled on adverse effects of illicit drug and excessive alcohol use in patients with high blood pressure  Counseled on the importance of exercise goals with target of 150 minutes per  week Discussed plans with patient for ongoing care management follow up and provided patient with direct contact information for care management team Advised patient, providing education and rationale, to monitor blood pressure daily and record, calling PCP for findings outside established parameters Reviewed scheduled/upcoming provider appointments including:  Discussed complications of poorly controlled blood pressure such as heart disease, stroke, circulatory complications, vision complications, kidney impairment, sexual dysfunction Screening for signs and symptoms of depression related to chronic disease state   Patient Goals/Self-Care Activities: Take all medications as prescribed Attend all scheduled provider appointments Call pharmacy for medication refills 3-7 days in advance of running out of medications Attend church or other social activities Call provider office for new concerns or questions  check blood pressure 3 times per week write blood pressure results in a log or diary call doctor for signs and symptoms of high blood pressure begin an exercise program eat more whole grains, fruits and vegetables, lean meats and healthy fats  Follow Up Plan:  Telephone follow up appointment with care management team member scheduled for:  10-28-2023 at 10:00 am           Our next appointment is by telephone on 10-28-2023 at 10:00 am  Please call the care guide team at [phone] if you need to cancel or reschedule your appointment.   If you are experiencing a Mental Health or Behavioral Health Crisis or need someone to talk to, please call the Suicide and Crisis Lifeline: 988 call the [place] National Suicide Prevention Lifeline: (308)866-5047 or TTY: 626-794-2058 TTY (212) 068-4793) to talk to a trained counselor call 1-800-273-TALK (toll free, 24 hour hotline) go to [facility] [address] [place] [phone]) call 911   Patient  verbalizes understanding of instructions and care plan provided today and agrees to view in MyChart. Active MyChart status and patient understanding of how to access instructions and care plan via MyChart confirmed with patient.     Telephone follow up appointment with care management team member scheduled for: 10-28-2023 at 10:00 am  Danise Edge, BSN RN RN Care Manager  [facility]  Ambulatory Care Management  Direct Number: [phone]

## 2023-10-19 ENCOUNTER — Encounter: Payer: Self-pay | Admitting: Internal Medicine

## 2023-10-19 ENCOUNTER — Ambulatory Visit: Payer: 59 | Attending: Internal Medicine | Admitting: Internal Medicine

## 2023-10-19 VITALS — BP 110/70 | HR 69 | Ht 71.0 in | Wt 169.8 lb

## 2023-10-19 DIAGNOSIS — R42 Dizziness and giddiness: Secondary | ICD-10-CM | POA: Diagnosis not present

## 2023-10-19 DIAGNOSIS — I10 Essential (primary) hypertension: Secondary | ICD-10-CM | POA: Diagnosis not present

## 2023-10-19 DIAGNOSIS — I951 Orthostatic hypotension: Secondary | ICD-10-CM

## 2023-10-19 NOTE — Patient Instructions (Signed)
Medication Instructions:   *If you need a refill on your cardiac medications before your next appointment, please call your pharmacy*   Lab Work: Cbc, nmr, tsh, cmet, hgba1c If you have labs (blood work) drawn today and your tests are completely normal, you will receive your results only by: MyChart Message (if you have MyChart) OR A paper copy in the mail If you have any lab test that is abnormal or we need to change your treatment, we will call you to review the results.   Testing/Procedures:    Follow-Up: At [facility], you and your health needs are our priority.  As part of our continuing mission to provide you with exceptional heart care, we have created designated Provider Care Teams.  These Care Teams include your primary Cardiologist (physician) and Advanced Practice Providers (APPs -  Physician Assistants and Nurse Practitioners) who all work together to provide you with the care you need, when you need it.  We recommend signing up for the patient portal called "MyChart".  Sign up information is provided on this After Visit Summary.  MyChart is used to connect with patients for Virtual Visits (Telemedicine).  Patients are able to view lab/test results, encounter notes, upcoming appointments, etc.  Non-urgent messages can be sent to your provider as well.   To learn more about what you can do with MyChart, go to [email or web address].    Your next appointment:  9 months

## 2023-10-19 NOTE — Progress Notes (Signed)
Cardiology Office Note   Date:  10/19/2023   ID:  Jose Li, DOB 06-26-1956, MRN [id number]  PCP:  Bess Kinds, MD  Cardiologist:   Dietrich Pates, MD   F/U of dizziness, orthostatic intolerance    History of Present Illness: Jose Li is a 67 y.o. male with a history of dizziness, syncope, autonomic dysfunction, mild CAD (on CT scan), bipolar disorder, OSA (on BiPAP),, hypokalemia, seizures due to EtOH withdrawal., HL, tobacco use, EtOH abuse, HTN, BPH and hypokalemia The pt was hospitalized 05/2022-06/2022  for L sided subdural hematoma, s/p craniotomy   Complicated by delirium   Discharged to SNF.   The pt was seen in ER on 08/24/22 with weakness, falls, CK elevation.  Received IV fluids   Discharged to a SNF. Note the pt has had problems with dizziness when psych meds have  been changed   I last saw the pt in May 2024 Since I saw him in clinic he says he gets dizzy occasionally  Denies syncope Also notes occasional palpitations     Denies CP  No SOB     Still smoking 1 ppd     Allergies:   Cymbalta [duloxetine hcl], Erythromycin, Hctz [hydrochlorothiazide], and Inderal [propranolol]   Past Medical History:  Diagnosis Date   Acute kidney injury (HCC) 05/26/2018   Allergy    Anemia    Anxiety    Arthritis    LEGS,BACK   Cataract    BILATERAL   CKD (chronic kidney disease), stage III (HCC)    Depression    History of kidney stones    Hyperlipidemia    Hypertension, essential, benign 08/02/2012   Orthostatic hypotension 10/09/2013   OSA (obstructive sleep apnea) 10/01/2015   Severe OSA with an AHI of 86/hr now on BiPAP at 23/19cm H2O   Pneumonia    Rhabdomyolysis 08/24/2022   Seizures (HCC)    none for last 6-46months   Sleep apnea    Substance abuse (HCC)     Past Surgical History:  Procedure Laterality Date   COLONOSCOPY     CRANIOTOMY Left 06/08/2022   Procedure: CRANIOTOMY HEMATOMA EVACUATION SUBDURAL;  Surgeon: Julio Sicks, MD;  Location: [facility] OR;   Service: Neurosurgery;  Laterality: Left;   CYSTOSCOPY WITH RETROGRADE PYELOGRAM, URETEROSCOPY AND STENT PLACEMENT Right 03/29/2023   Procedure: CYSTOSCOPY WITH RETROGRADE PYELOGRAM, URETEROSCOPY AND STENT PLACEMENT;  Surgeon: [name], MD;  Location: [facility]S;  Service: Urology;  Laterality: Right;   EXTRACORPOREAL SHOCK WAVE LITHOTRIPSY Right 03/28/2023   Procedure: EXTRACORPOREAL SHOCK WAVE LITHOTRIPSY (ESWL);  Surgeon: [name], MD;  Location: [facility];  Service: Urology;  Laterality: Right;  Local anesthesia   FRACTURE SURGERY  1990   right hip and femur   HERNIA REPAIR  07/2009   umbilical hernia/ got infected   JOINT REPLACEMENT Right 1990   right hip   TRACHEOSTOMY  08/1999   Had pneumoniaand was in a coma 10 days   WISDOM TOOTH EXTRACTION       Social History:  The patient  reports that he has been smoking cigarettes. He has a 30 pack-year smoking history. He has been exposed to tobacco smoke. He has never used smokeless tobacco. He reports that he does not currently use alcohol. He reports that he does not currently use drugs.   Family History:  The patient's family history includes Diabetes in his father; Heart disease in his brother and mother.    ROS:  Please see  the history of present illness. All other systems are reviewed and  Negative to the above problem except as noted.  BP 110/70   P 69   Wt 169    GEN: Pt is a 67 yo in NAD  HEENT: normal  Neck:   JVP is normal  Cardiac: RRR; no murmurs.    Respiratory:  clear to auscultation  GI  Soft  Nontender   No hepatomegaly    EKG:  EKG shows NSR     Lipid Panel    Component Value Date/Time   CHOL 134 12/13/2022 1246   TRIG 146 12/13/2022 1246   HDL 36 (L) 12/13/2022 1246   CHOLHDL 3.7 12/13/2022 1246   CHOLHDL 5.3 (H) 12/29/2015 1146   VLDL 79 (H) 12/29/2015 1146   LDLCALC 72 12/13/2022 1246      Wt Readings from Last 3 Encounters:  08/17/23 170 lb (77.1 kg)  07/14/23 175 lb  (79.4 kg)  05/26/23 170 lb (77.1 kg)      ASSESSMENT AND PLAN: 1  Autonomic dysfunction  PT overall doing OK   Still with some dizziness, no syncope STay hydrated    Keep on midodrine10/5/5  2  CAD   Mild plaquing on CT Pt denies CP   4  HL  Will check lipomed  Keep on statin  Check BMET, A1C, TSH    Follow up in 9 months   4 Hx of  Hypokalemia  K 4.1 on 03/22/23   Keep on same regimen    5  Tob abuse   Counselled again on cessation.    6  Preop  Plan for cystoscopy, lithotripsy.    From cardiac standpoint OK to proceed   Hydrate   Current medicines are reviewed at length with the patient today.  The patient does not have concerns regarding medicines.  Signed, Dietrich Pates, MD  10/19/2023 3:47 PM    [facility] [address] [place], [place], [place]  [address] Phone: [phone]; Fax: [phone]

## 2023-10-20 LAB — CBC
Hematocrit: 44.3 % (ref 37.5–51.0)
Hemoglobin: 14.3 g/dL (ref 13.0–17.7)
MCH: 29.1 pg (ref 26.6–33.0)
MCHC: 32.3 g/dL (ref 31.5–35.7)
MCV: 90 fL (ref 79–97)
Platelets: 362 10*3/uL (ref 150–450)
RBC: 4.92 x10E6/uL (ref 4.14–5.80)
RDW: 13 % (ref 11.6–15.4)
WBC: 8.2 10*3/uL (ref 3.4–10.8)

## 2023-10-20 LAB — HEMOGLOBIN A1C
Est. average glucose Bld gHb Est-mCnc: 123 mg/dL
Hgb A1c MFr Bld: 5.9 % — ABNORMAL HIGH (ref 4.8–5.6)

## 2023-10-20 LAB — NMR, LIPOPROFILE
Cholesterol, Total: 194 mg/dL (ref 100–199)
HDL Particle Number: 38.1 umol/L (ref >=30.5)
HDL-C: 46 mg/dL (ref >39)
LDL Particle Number: 1295 nmol/L — ABNORMAL HIGH (ref <1000)
LDL Size: 20.4 nmol — ABNORMAL LOW (ref >20.5)
LDL-C (NIH Calc): 94 mg/dL (ref 0–99)
LP-IR Score: 89 — ABNORMAL HIGH (ref <=45)
Small LDL Particle Number: 632 nmol/L — ABNORMAL HIGH (ref <=527)
Triglycerides: 328 mg/dL — ABNORMAL HIGH (ref 0–149)

## 2023-10-20 LAB — COMPREHENSIVE METABOLIC PANEL
ALT: 16 [IU]/L (ref 0–44)
AST: 18 [IU]/L (ref 0–40)
Albumin: 4.7 g/dL (ref 3.9–4.9)
Alkaline Phosphatase: 86 [IU]/L (ref 44–121)
BUN/Creatinine Ratio: 12 (ref 10–24)
BUN: 15 mg/dL (ref 8–27)
Bilirubin Total: <0.2 mg/dL (ref 0.0–1.2)
CO2: 24 mmol/L (ref 20–29)
Calcium: 10 mg/dL (ref 8.6–10.2)
Chloride: 104 mmol/L (ref 96–106)
Creatinine, Ser: 1.25 mg/dL (ref 0.76–1.27)
Globulin, Total: 2.8 g/dL (ref 1.5–4.5)
Glucose: 82 mg/dL (ref 70–99)
Potassium: 4.7 mmol/L (ref 3.5–5.2)
Sodium: 141 mmol/L (ref 134–144)
Total Protein: 7.5 g/dL (ref 6.0–8.5)
eGFR: 64 mL/min/{1.73_m2} (ref >59)

## 2023-10-20 LAB — TSH: TSH: 1.59 u[IU]/mL (ref 0.450–4.500)

## 2023-10-25 ENCOUNTER — Other Ambulatory Visit: Payer: Self-pay

## 2023-10-25 DIAGNOSIS — E782 Mixed hyperlipidemia: Secondary | ICD-10-CM

## 2023-10-25 MED ORDER — ATORVASTATIN CALCIUM 40 MG PO TABS: 40.0000 mg | ORAL_TABLET | Freq: Every day | ORAL | 3 refills | Status: DC

## 2023-10-28 ENCOUNTER — Other Ambulatory Visit: Payer: Self-pay | Admitting: *Deleted

## 2023-10-28 NOTE — Patient Outreach (Signed)
Care Management   Visit Note  10/28/2023 Name: Jose Li MRN: [id number] DOB: May 10, 1956  Subjective: Jose Li is a 67 y.o. year old male who is a primary care patient of Bess Kinds, MD. The Care Management team was consulted for assistance.      Engaged with patient spoke with patient by telephone.    Goals Addressed             This Visit's Progress    RNCM Care Management Expected Outcome: Monitor, Self-Manage and Reduce Symptoms of: HTN, HLD, BPH       Current Barriers:  Knowledge Deficits related to plan of care for management of HTN, HLD, and BPH  Chronic Disease Management support and education needs related to HTN, HLD, and BPH   RNCM Clinical Goal(s):  Patient will verbalize basic understanding of  HTN, HLD, and BPH disease process and self health management plan as evidenced by verbal explanation, recognizing symptoms and continued lifestyle modifications  take all medications exactly as prescribed and will call provider for medication related questions as evidenced by compliance with all medications attend all scheduled medical appointments: with primary care provider and specialist as evidenced by keeping all scheduled appointments continue to work with RN Care Manager to address care management and care coordination needs related to  HTN, HLD, and BPH as evidenced by adherence to CM Team Scheduled appointments through collaboration with RN Care manager, provider, and care team.   Interventions: Evaluation of current treatment plan related to  self management and patient's adherence to plan as established by provider   BPH  (Status:  Goal on track:  Yes. and Condition stable.  Not addressed this visit.)  Long Term Goal Evaluation of current treatment plan related to BPH, self-management and patient's adherence to plan as established by provider.  Discussed plans with patient for ongoing care management follow up and provided patient with direct contact  information for care management team Discussed the importance of regular follow-ups. Urology follow up 02-15-2024 Encouraged a diet rich in fruits, vegetables, and whole grains.Limit caffeine and alcohol intake. Advised patient to manage fluid intake Promote regular physical activity to improve overall health   Hypertension Interventions:  (Status:  New goal. and Goal on track:  Yes.) Long Term Goal Last practice recorded BP readings:  BP Readings from Last 3 Encounters:  10/19/23 110/70  08/17/23 123/74  07/14/23 139/64   Most recent eGFR/CrCl:  Lab Results  Component Value Date   EGFR 64 10/19/2023    No components found for: "CRCL"  Evaluation of current treatment plan related to hypertension self management and patient's adherence to plan as established by provider. Patient does not regularly check his blood pressure and states that he has a machine but does not know how to properly use it. RNCM asked if he had anyone that could help him use it and he states he will ask a friend when they come to visit him. He does endorse some dizziness-has upcoming neurology appointment scheduled. No falls reported. Provided education to patient re: stroke prevention, s/s of heart attack and stroke Reviewed medications with patient and discussed importance of compliance Counseled on adverse effects of illicit drug and excessive alcohol use in patients with high blood pressure  Counseled on the importance of exercise goals with target of 150 minutes per week Discussed plans with patient for ongoing care management follow up and provided patient with direct contact information for care management team Advised patient, providing education and rationale,  to monitor blood pressure daily and record, calling PCP for findings outside established parameters Reviewed scheduled/upcoming provider appointments including: Neurology: 02-01-2024 Discussed complications of poorly controlled blood pressure such as heart  disease, stroke, circulatory complications, vision complications, kidney impairment, sexual dysfunction Screening for signs and symptoms of depression related to chronic disease state    Hyperlipidemia Interventions:  (Status:  New goal. and Goal on track:  NO.) Long Term Goal Medication review performed; medication list updated in electronic medical record.  Provider established cholesterol goals reviewed Counseled on importance of regular laboratory monitoring as prescribed. Will have repeat labs in a few months due to increase. Provided HLD educational materials Reviewed role and benefits of statin for ASCVD risk reduction. Lipitor increased.  Discussed strategies to manage statin-induced myalgias. No myalgias reported. Reviewed importance of limiting foods high in cholesterol. States he primarily eats frozen prepackaged foods such as [employer] or [employer] and he will occasionally eat out. Per cardiology, they recommended that he cub back on his carb intake. RNCM advised to be mindful of prepackaged meals and to choose healthier options. A1C noted to be trended upwards. Reviewed exercise goals and target of 150 minutes per week Screening for signs and symptoms of depression related to chronic disease state Assessed social determinant of health barriers    Patient Goals/Self-Care Activities: Take all medications as prescribed Attend all scheduled provider appointments Call pharmacy for medication refills 3-7 days in advance of running out of medications Attend church or other social activities Call provider office for new concerns or questions  check blood pressure 3 times per week write blood pressure results in a log or diary call doctor for signs and symptoms of high blood pressure begin an exercise program eat more whole grains, fruits and vegetables, lean meats and healthy fats  Follow Up Plan:  Telephone follow up appointment with care management team member scheduled for:   11-29-2023 at 10:30 am             Consent to Services:  Patient was given information about care management services, agreed to services, and gave verbal consent to participate.   Plan: Telephone follow up appointment with care management team member scheduled for:11-29-2023 at 10:30 am  Danise Edge, BSN RN RN Care Manager  [facility]  Ambulatory Care Management  Direct Number: [phone]

## 2023-10-28 NOTE — Patient Instructions (Signed)
Visit Information  Thank you for taking time to visit with me today. Please don't hesitate to contact me if I can be of assistance to you before our next scheduled telephone appointment.  Following are the goals we discussed today:   Goals Addressed             This Visit's Progress    RNCM Care Management Expected Outcome: Monitor, Self-Manage and Reduce Symptoms of: HTN, HLD, BPH       Current Barriers:  Knowledge Deficits related to plan of care for management of HTN, HLD, and BPH  Chronic Disease Management support and education needs related to HTN, HLD, and BPH   RNCM Clinical Goal(s):  Patient will verbalize basic understanding of  HTN, HLD, and BPH disease process and self health management plan as evidenced by verbal explanation, recognizing symptoms and continued lifestyle modifications  take all medications exactly as prescribed and will call provider for medication related questions as evidenced by compliance with all medications attend all scheduled medical appointments: with primary care provider and specialist as evidenced by keeping all scheduled appointments continue to work with RN Care Manager to address care management and care coordination needs related to  HTN, HLD, and BPH as evidenced by adherence to CM Team Scheduled appointments through collaboration with RN Care manager, provider, and care team.   Interventions: Evaluation of current treatment plan related to  self management and patient's adherence to plan as established by provider   BPH  (Status:  Goal on track:  Yes. and Condition stable.  Not addressed this visit.)  Long Term Goal Evaluation of current treatment plan related to BPH, self-management and patient's adherence to plan as established by provider.  Discussed plans with patient for ongoing care management follow up and provided patient with direct contact information for care management team Discussed the importance of regular follow-ups. Urology  follow up 02-15-2024 Encouraged a diet rich in fruits, vegetables, and whole grains.Limit caffeine and alcohol intake. Advised patient to manage fluid intake Promote regular physical activity to improve overall health   Hypertension Interventions:  (Status:  New goal. and Goal on track:  Yes.) Long Term Goal Last practice recorded BP readings:  BP Readings from Last 3 Encounters:  10/19/23 110/70  08/17/23 123/74  07/14/23 139/64   Most recent eGFR/CrCl:  Lab Results  Component Value Date   EGFR 64 10/19/2023    No components found for: "CRCL"  Evaluation of current treatment plan related to hypertension self management and patient's adherence to plan as established by provider. Patient does not regularly check his blood pressure and states that he has a machine but does not know how to properly use it. RNCM asked if he had anyone that could help him use it and he states he will ask a friend when they come to visit him. He does endorse some dizziness-has upcoming neurology appointment scheduled. No falls reported. Provided education to patient re: stroke prevention, s/s of heart attack and stroke Reviewed medications with patient and discussed importance of compliance Counseled on adverse effects of illicit drug and excessive alcohol use in patients with high blood pressure  Counseled on the importance of exercise goals with target of 150 minutes per week Discussed plans with patient for ongoing care management follow up and provided patient with direct contact information for care management team Advised patient, providing education and rationale, to monitor blood pressure daily and record, calling PCP for findings outside established parameters Reviewed scheduled/upcoming provider appointments including:  Neurology: 02-01-2024 Discussed complications of poorly controlled blood pressure such as heart disease, stroke, circulatory complications, vision complications, kidney impairment, sexual  dysfunction Screening for signs and symptoms of depression related to chronic disease state    Hyperlipidemia Interventions:  (Status:  New goal. and Goal on track:  NO.) Long Term Goal Medication review performed; medication list updated in electronic medical record.  Provider established cholesterol goals reviewed Counseled on importance of regular laboratory monitoring as prescribed. Will have repeat labs in a few months due to increase. Provided HLD educational materials Reviewed role and benefits of statin for ASCVD risk reduction. Lipitor increased.  Discussed strategies to manage statin-induced myalgias. No myalgias reported. Reviewed importance of limiting foods high in cholesterol. States he primarily eats frozen prepackaged foods such as [employer] or [employer] and he will occasionally eat out. Per cardiology, they recommended that he cub back on his carb intake. RNCM advised to be mindful of prepackaged meals and to choose healthier options. A1C noted to be trended upwards. Reviewed exercise goals and target of 150 minutes per week Screening for signs and symptoms of depression related to chronic disease state Assessed social determinant of health barriers    Patient Goals/Self-Care Activities: Take all medications as prescribed Attend all scheduled provider appointments Call pharmacy for medication refills 3-7 days in advance of running out of medications Attend church or other social activities Call provider office for new concerns or questions  check blood pressure 3 times per week write blood pressure results in a log or diary call doctor for signs and symptoms of high blood pressure begin an exercise program eat more whole grains, fruits and vegetables, lean meats and healthy fats  Follow Up Plan:  Telephone follow up appointment with care management team member scheduled for:  11-29-2023 at 10:30 am           Our next appointment is by telephone on 11-29-2023 at  10:30 am  Please call the care guide team at [phone] if you need to cancel or reschedule your appointment.   If you are experiencing a Mental Health or Behavioral Health Crisis or need someone to talk to, please call the Suicide and Crisis Lifeline: 988 call the [place] National Suicide Prevention Lifeline: 702-212-2896 or TTY: 763-452-6969 TTY (786)142-0457) to talk to a trained counselor call 1-800-273-TALK (toll free, 24 hour hotline) go to [facility] [address], [place] [phone])   Patient verbalizes understanding of instructions and care plan provided today and agrees to view in MyChart. Active MyChart status and patient understanding of how to access instructions and care plan via MyChart confirmed with patient.     Telephone follow up appointment with care management team member scheduled for:11-29-2023 at 10:30 am  Danise Edge, BSN RN RN Care Manager  [facility]  Ambulatory Care Management  Direct Number: [phone]

## 2023-11-29 ENCOUNTER — Telehealth: Payer: Self-pay | Admitting: *Deleted

## 2023-11-29 ENCOUNTER — Other Ambulatory Visit: Payer: Self-pay | Admitting: *Deleted

## 2023-11-29 NOTE — Patient Instructions (Signed)
 Visit Information  Thank you for taking time to visit with me today. Please don't hesitate to contact me if I can be of assistance to you before our next scheduled telephone appointment.  Following are the goals we discussed today:   Goals Addressed             This Visit's Progress    RNCM Care Management Expected Outcome: Monitor, Self-Manage and Reduce Symptoms of: HTN, HLD, BPH       Current Barriers:  Knowledge Deficits related to plan of care for management of HTN, HLD, and BPH  Chronic Disease Management support and education needs related to HTN, HLD, and BPH   RNCM Clinical Goal(s):  Patient will verbalize basic understanding of  HTN, HLD, and BPH disease process and self health management plan as evidenced by verbal explanation, recognizing symptoms and continued lifestyle modifications  take all medications exactly as prescribed and will call provider for medication related questions as evidenced by compliance with all medications attend all scheduled medical appointments: with primary care provider and specialist as evidenced by keeping all scheduled appointments continue to work with RN Care Manager to address care management and care coordination needs related to  HTN, HLD, and BPH as evidenced by adherence to CM Team Scheduled appointments through collaboration with RN Care manager, provider, and care team.   Interventions: Evaluation of current treatment plan related to  self management and patient's adherence to plan as established by provider   BPH  (Status:  Condition stable.  Not addressed this visit.)  Long Term Goal Evaluation of current treatment plan related to BPH, self-management and patient's adherence to plan as established by provider.  Discussed plans with patient for ongoing care management follow up and provided patient with direct contact information for care management team Discussed the importance of regular follow-ups. Urology follow up  02-15-2024 Encouraged a diet rich in fruits, vegetables, and whole grains.Limit caffeine and alcohol intake. Advised patient to manage fluid intake Promote regular physical activity to improve overall health   Hypertension Interventions:  (Status:  Goal on track:  Yes.) Long Term Goal Last practice recorded BP readings:  BP Readings from Last 3 Encounters:  10/19/23 110/70  08/17/23 123/74  07/14/23 139/64   Most recent eGFR/CrCl:  Lab Results  Component Value Date   EGFR 64 10/19/2023    No components found for: CRCL  Evaluation of current treatment plan related to hypertension self management and patient's adherence to plan as established by provider. Reports that BP has been stable and home readings are in the 130s/70s. Provided education to patient re: stroke prevention, s/s of heart attack and stroke Reviewed medications with patient and discussed importance of compliance Counseled on adverse effects of illicit drug and excessive alcohol use in patients with high blood pressure  Counseled on the importance of exercise goals with target of 150 minutes per week Discussed plans with patient for ongoing care management follow up and provided patient with direct contact information for care management team Advised patient, providing education and rationale, to monitor blood pressure daily and record, calling PCP for findings outside established parameters Reviewed scheduled/upcoming provider appointments including: Neurology: 02-01-2024 Discussed complications of poorly controlled blood pressure such as heart disease, stroke, circulatory complications, vision complications, kidney impairment, sexual dysfunction Screening for signs and symptoms of depression related to chronic disease state    Hyperlipidemia Interventions:  (Status:  Goal on track:  Yes.) Long Term Goal Medication review performed; medication list updated in electronic  medical record.  Provider established cholesterol  goals reviewed Counseled on importance of regular laboratory monitoring as prescribed. Will have repeat labs in a few months due to increase. Provided HLD educational materials Reviewed role and benefits of statin for ASCVD risk reduction. Reports compliance with Lipitor.  Discussed strategies to manage statin-induced myalgias. No myalgias reported. Reviewed importance of limiting foods high in cholesterol. States he has cut back on is prepackaged/frozen meals. RNCM advised to continue being mindful of diet Reviewed exercise goals and target of 150 minutes per week Screening for signs and symptoms of depression related to chronic disease state Assessed social determinant of health barriers    Patient Goals/Self-Care Activities: Take all medications as prescribed Attend all scheduled provider appointments Call pharmacy for medication refills 3-7 days in advance of running out of medications Attend church or other social activities Call provider office for new concerns or questions  check blood pressure 3 times per week write blood pressure results in a log or diary call doctor for signs and symptoms of high blood pressure begin an exercise program eat more whole grains, fruits and vegetables, lean meats and healthy fats  Follow Up Plan:  Telephone follow up appointment with care management team member scheduled for:  02-02-2024 at 10:00 am           Our next appointment is by telephone on 02-02-2024 at 10:00 am  Please call the care guide team at [phone] if you need to cancel or reschedule your appointment.   If you are experiencing a Mental Health or Behavioral Health Crisis or need someone to talk to, please call the Suicide and Crisis Lifeline: 988 call the [place]  National Suicide Prevention Lifeline: 631 225 9615 or TTY: 970-135-8698 TTY 248-803-0628) to talk to a trained counselor call 1-800-273-TALK (toll free, 24 hour hotline) go to [facility]  Urgent [facility] [address], [place] [phone])   Patient verbalizes understanding of instructions and care plan provided today and agrees to view in MyChart. Active MyChart status and patient understanding of how to access instructions and care plan via MyChart confirmed with patient.     Telephone follow up appointment with care management team member scheduled for:02-02-2024 at 10:00 am  Rosina Forte, BSN RN RN Care Manager  [facility]  Ambulatory Care Management  Direct Number: [phone]

## 2023-11-29 NOTE — Patient Outreach (Signed)
  Care Management   Follow Up Note   11/29/2023 Name: Jose Li MRN: [id number] DOB: [birth year]   Referred by: Jennelle Riis, MD Reason for referral : Care Management (RNCM: ATTEMPT #1 Follow Up For Chronic Disease Management & Care Coordination Needs)   An unsuccessful telephone outreach was attempted today. The patient was referred to the case management team for assistance with care management and care coordination.   Follow Up Plan: The care management team will reach out to the patient again over the next 30 days.   Rosina Forte, BSN RN RN Care Manager  [facility]  Ambulatory Care Management  Direct Number: [phone]

## 2023-11-29 NOTE — Patient Outreach (Signed)
 Care Management   Visit Note  11/29/2023 Name: Jose Li MRN: [id number] DOB: 1956-11-10  Subjective: Jose Li is a 68 y.o. year old male who is a primary care patient of Jennelle Riis, MD. The Care Management team was consulted for assistance.      Engaged with patient spoke with patient by telephone.    Goals Addressed             This Visit's Progress    RNCM Care Management Expected Outcome: Monitor, Self-Manage and Reduce Symptoms of: HTN, HLD, BPH       Current Barriers:  Knowledge Deficits related to plan of care for management of HTN, HLD, and BPH  Chronic Disease Management support and education needs related to HTN, HLD, and BPH   RNCM Clinical Goal(s):  Patient will verbalize basic understanding of  HTN, HLD, and BPH disease process and self health management plan as evidenced by verbal explanation, recognizing symptoms and continued lifestyle modifications  take all medications exactly as prescribed and will call provider for medication related questions as evidenced by compliance with all medications attend all scheduled medical appointments: with primary care provider and specialist as evidenced by keeping all scheduled appointments continue to work with RN Care Manager to address care management and care coordination needs related to  HTN, HLD, and BPH as evidenced by adherence to CM Team Scheduled appointments through collaboration with RN Care manager, provider, and care team.   Interventions: Evaluation of current treatment plan related to  self management and patient's adherence to plan as established by provider   BPH  (Status:  Condition stable.  Not addressed this visit.)  Long Term Goal Evaluation of current treatment plan related to BPH, self-management and patient's adherence to plan as established by provider.  Discussed plans with patient for ongoing care management follow up and provided patient with direct contact information for care  management team Discussed the importance of regular follow-ups. Urology follow up 02-15-2024 Encouraged a diet rich in fruits, vegetables, and whole grains.Limit caffeine and alcohol intake. Advised patient to manage fluid intake Promote regular physical activity to improve overall health   Hypertension Interventions:  (Status:  Goal on track:  Yes.) Long Term Goal Last practice recorded BP readings:  BP Readings from Last 3 Encounters:  10/19/23 110/70  08/17/23 123/74  07/14/23 139/64   Most recent eGFR/CrCl:  Lab Results  Component Value Date   EGFR 64 10/19/2023    No components found for: CRCL  Evaluation of current treatment plan related to hypertension self management and patient's adherence to plan as established by provider. Reports that BP has been stable and home readings are in the 130s/70s. Provided education to patient re: stroke prevention, s/s of heart attack and stroke Reviewed medications with patient and discussed importance of compliance Counseled on adverse effects of illicit drug and excessive alcohol use in patients with high blood pressure  Counseled on the importance of exercise goals with target of 150 minutes per week Discussed plans with patient for ongoing care management follow up and provided patient with direct contact information for care management team Advised patient, providing education and rationale, to monitor blood pressure daily and record, calling PCP for findings outside established parameters Reviewed scheduled/upcoming provider appointments including: Neurology: 02-01-2024 Discussed complications of poorly controlled blood pressure such as heart disease, stroke, circulatory complications, vision complications, kidney impairment, sexual dysfunction Screening for signs and symptoms of depression related to chronic disease state    Hyperlipidemia Interventions:  (  Status:  Goal on track:  Yes.) Long Term Goal Medication review performed;  medication list updated in electronic medical record.  Provider established cholesterol goals reviewed Counseled on importance of regular laboratory monitoring as prescribed. Will have repeat labs in a few months due to increase. Provided HLD educational materials Reviewed role and benefits of statin for ASCVD risk reduction. Reports compliance with Lipitor.  Discussed strategies to manage statin-induced myalgias. No myalgias reported. Reviewed importance of limiting foods high in cholesterol. States he has cut back on is prepackaged/frozen meals. RNCM advised to continue being mindful of diet Reviewed exercise goals and target of 150 minutes per week Screening for signs and symptoms of depression related to chronic disease state Assessed social determinant of health barriers    Patient Goals/Self-Care Activities: Take all medications as prescribed Attend all scheduled provider appointments Call pharmacy for medication refills 3-7 days in advance of running out of medications Attend church or other social activities Call provider office for new concerns or questions  check blood pressure 3 times per week write blood pressure results in a log or diary call doctor for signs and symptoms of high blood pressure begin an exercise program eat more whole grains, fruits and vegetables, lean meats and healthy fats  Follow Up Plan:  Telephone follow up appointment with care management team member scheduled for:  02-02-2024 at 10:00 am              Consent to Services:  Patient was given information about care management services, agreed to services, and gave verbal consent to participate.   Plan: Telephone follow up appointment with care management team member scheduled for:02-02-2024 at 10:00 am  Rosina Forte, BSN RN RN Care Manager  [facility]  Ambulatory Care Management  Direct Number: [phone]

## 2023-12-14 ENCOUNTER — Other Ambulatory Visit: Payer: Self-pay | Admitting: Neurology

## 2024-01-13 ENCOUNTER — Other Ambulatory Visit: Payer: Self-pay | Admitting: Neurology

## 2024-01-13 NOTE — Telephone Encounter (Signed)
 Last seen on 07/14/23 Follow up scheduled on 02/01/24 Last filled on 12/12/23 #60 tablets (30 day supply) Rx pending to be signed

## 2024-01-27 DIAGNOSIS — E782 Mixed hyperlipidemia: Secondary | ICD-10-CM | POA: Diagnosis not present

## 2024-01-28 LAB — NMR, LIPOPROFILE
Cholesterol, Total: 122 mg/dL (ref 100–199)
HDL Particle Number: 30.3 umol/L — ABNORMAL LOW (ref >=30.5)
HDL-C: 35 mg/dL — ABNORMAL LOW (ref >39)
LDL Particle Number: 770 nmol/L (ref <1000)
LDL Size: 19.9 nm — ABNORMAL LOW (ref >20.5)
LDL-C (NIH Calc): 58 mg/dL (ref 0–99)
LP-IR Score: 60 — ABNORMAL HIGH (ref <=45)
Small LDL Particle Number: 582 nmol/L — ABNORMAL HIGH (ref <=527)
Triglycerides: 174 mg/dL — ABNORMAL HIGH (ref 0–149)

## 2024-01-31 ENCOUNTER — Other Ambulatory Visit: Payer: Self-pay | Admitting: Urology

## 2024-01-31 DIAGNOSIS — N401 Enlarged prostate with lower urinary tract symptoms: Secondary | ICD-10-CM

## 2024-01-31 NOTE — Progress Notes (Unsigned)
 PATIENT: Jose Li DOB: Aug 16, 1956  REASON FOR VISIT: follow up for seizures HISTORY FROM: patient PRIMARY NEUROLOGIST: Dr. Teresa Coombs  HISTORY OF PRESENT ILLNESS: Today 02/01/24 Last Visit with Dr. Teresa Coombs, continue Keppra 1500 mg BID, Start Vimpat 100 mg BID. Reports 2 week ago, he had a "mild" seizure. Was at home alone, got weak, had loss on consciousness,  woke up fallen out of bed around 2 AM. No incontinence or oral injury. Also on Lyrica 75 mg twice daily. Denies missing any doses. No known triggers for seizures. Doesn't drive. Friends helps him run errands. Health has been good. Continues to see psych.  07/14/23 Dr. Teresa Coombs: Patient presents today for follow-up, he is alone.  Last visit was in February and since then he has been doing well until August 17 when he did have a breakthrough seizure.  He reports compliance with his medication, Keppra 1500 mg twice daily.  He did not go to the hospital after the seizure.  Denies any major injury.  He denies any triggering factor, did not get sick, was sleeping well and was taking this medication. His last Keppra level was within normal limits 63.5.  12/30/2022:  Here today for follow-up, admitted 06/07/2022 for left-sided subdural hematoma with craniotomy, his hospitalization was complicated by delirium.  Discharged to skilled nursing facility.  Admitted in October for orthostatic hypotension.  Follows with cardiology for autonomic dysfunction. Alone today, back at his home. He mentions the fall he had in July, he thinks he fell down the stairs, his girlfriend found him, went to the ER, found subdural hematoma. Remains on Keppra 1500 mg twice daily, Lyrica 75 mg twice daily. He wonders if his fall was due to seizure, he thinks he was missing doses of his medication at that time. He didn't have an ID to get the Lyrica at the time. He doesn't drive. He claims he is able to manage his own medications. Also seems cardiology for passing out spells. He is  currently in PT/ST at his home. Under a lot of stress, managing money, financial. He denies any alcohol use. He goes to [facility] for his Bipolar Disorder. He does smack his lips, he says due to dentures slipping.   Update 12/29/21 SS: [name] is here today for follow-up for seizures. His father passed away last week at age of 46. Tawfiq is going to move to an apartment. He is under a lot of stress. Remains on Lyrica and Keppra. At times he might feel a seizure coming on, he will lay down. In the past has had grand mal seizures, can't remember the last time. Doesn't have a drivers license. No major health issues. Had a passing out spell back in November, December, with rib injury. On midodrine from cardiology.   Update 12/29/2020 SS: [name] is a 68 year old male with history of alcohol abuse and seizures.  Also history of orthostatic hypotension and OSA on BiPAP.  He remains on Keppra and Lyrica. No recurrent seizure.  He does not drive, he lives with his father who is 73.  He is followed by cardiology for orthostatic hypotension, denies any recent syncopal spells.  He has no complaints today.  His father drove him today, he is on disability, is not very active during the day, mostly stays home, takes care of cats.  Reports only occasional alcohol use.  Here today for evaluation unaccompanied.  HISTORY  11/13/2019 SS: [name] is a 68 year old male with history of alcohol abuse and history of seizures.  He also has history of orthostatic hypotension and OSA treated with BiPAP.  He remains on Keppra 750 mg tablets, 2 tablets twice a day.  He is also taking Lyrica 75 mg twice daily.  He reports he has not had recurrent seizure.  He says he has had 2 episodes of dizziness, likely related to his autonomic dysfunction, but no blackouts.  He says he is no longer drinking alcohol.  He does not drive a car.  He lives with his father who is 66 years old.  He says he is able to manage his medications.  He indicates his  overall health has been well.  He is on disability, he does not do much during the day.  He presents today for evaluation unaccompanied.  REVIEW OF SYSTEMS: Out of a complete 14 system review of symptoms, the patient complains only of the following symptoms, and all other reviewed systems are negative.  See HPI  ALLERGIES: Allergies  Allergen Reactions   Cymbalta [Duloxetine Hcl] Other (See Comments)    Hyponatremia    Erythromycin Other (See Comments)    Unknown reation   Hctz [Hydrochlorothiazide] Other (See Comments)    Syncope   Inderal [Propranolol] Other (See Comments)    Syncope     HOME MEDICATIONS: Outpatient Medications Prior to Visit  Medication Sig Dispense Refill   acetaminophen (TYLENOL) 325 MG tablet Take 2 tablets (650 mg total) by mouth every 4 (four) hours as needed for mild pain (temp > 100.5). (Patient taking differently: Take 1,300 mg by mouth every 4 (four) hours as needed for mild pain (pain score 1-3) (temp > 100.5). Arthritis strength)     aspirin 81 MG EC tablet Take 81 mg by mouth daily.     atorvastatin (LIPITOR) 40 MG tablet Take 1 tablet (40 mg total) by mouth daily. 90 tablet 3   benztropine (COGENTIN) 1 MG tablet Take 1 mg by mouth at bedtime.     Bismuth Subsalicylate (KAOPECTATE) 262 MG TABS Take 262 mg by mouth every 6 (six) hours as needed (Diarrhea).     bismuth subsalicylate (PEPTO BISMOL) 262 MG/15ML suspension Take 30 mLs by mouth every 6 (six) hours as needed for diarrhea or loose stools.     Boswellia-Glucosamine-Vit D (OSTEO BI-FLEX ONE PER DAY PO) Take 1 tablet by mouth daily.     busPIRone (BUSPAR) 5 MG tablet Take 5 mg by mouth 2 (two) times daily.      cetirizine (ZYRTEC) 10 MG tablet Take 10 mg by mouth daily.     citalopram (CELEXA) 20 MG tablet Take 7 mg by mouth daily.     Ensure (ENSURE) Take 237 mLs by mouth daily. Chocolate     finasteride (PROSCAR) 5 MG tablet TAKE 1 TABLET BY MOUTH DAILY 100 tablet 2   Lacosamide 100 MG TABS  Take 1 tablet by mouth twice daily 60 tablet 5   levETIRAcetam (KEPPRA) 750 MG tablet TAKE 2 TABLETS BY MOUTH TWICE  DAILY 400 tablet 2   midodrine (PROAMATINE) 5 MG tablet TAKE 2 TABLETS BY MOUTH ONCE DAILY AT 6 AM, THEN TAKE 1 TABLET BY MOUTH AT 10 AM, THEN TAKE 1 TABLET AT 2 PM 360 tablet 3   Multiple Vitamin (MULTIVITAMIN) tablet Take 1 tablet by mouth daily.     potassium chloride (KLOR-CON) 10 MEQ tablet Take 10 mEq by mouth daily.     pregabalin (LYRICA) 75 MG capsule TAKE 1 CAPSULE BY MOUTH TWICE  DAILY 180 capsule 1   risperiDONE (  RISPERDAL) 2 MG tablet Take 2 mg by mouth at bedtime.      tamsulosin (FLOMAX) 0.4 MG CAPS capsule Take 0.4 mg by mouth daily after supper.     thiamine 250 MG tablet Take 250 mg by mouth daily.     traZODone (DESYREL) 100 MG tablet Take 200 mg by mouth at bedtime.     No facility-administered medications prior to visit.    PAST MEDICAL HISTORY: Past Medical History:  Diagnosis Date   Acute kidney injury (HCC) 05/26/2018   Allergy    Anemia    Anxiety    Arthritis    LEGS,BACK   Cataract    BILATERAL   CKD (chronic kidney disease), stage III (HCC)    Depression    History of kidney stones    Hyperlipidemia    Hypertension, essential, benign 08/02/2012   Orthostatic hypotension 10/09/2013   OSA (obstructive sleep apnea) 10/01/2015   Severe OSA with an AHI of 86/hr now on BiPAP at 23/19cm H2O   Pneumonia    Rhabdomyolysis 08/24/2022   Seizures (HCC)    none for last 6-55months   Sleep apnea    Substance abuse (HCC)     PAST SURGICAL HISTORY: Past Surgical History:  Procedure Laterality Date   COLONOSCOPY     CRANIOTOMY Left 06/08/2022   Procedure: CRANIOTOMY HEMATOMA EVACUATION SUBDURAL;  Surgeon: Julio Sicks, MD;  Location: [facility] OR;  Service: Neurosurgery;  Laterality: Left;   CYSTOSCOPY WITH RETROGRADE PYELOGRAM, URETEROSCOPY AND STENT PLACEMENT Right 03/29/2023   Procedure: CYSTOSCOPY WITH RETROGRADE PYELOGRAM, URETEROSCOPY AND STENT  PLACEMENT;  Surgeon: [name], MD;  Location: [facility];  Service: Urology;  Laterality: Right;   EXTRACORPOREAL SHOCK WAVE LITHOTRIPSY Right 03/28/2023   Procedure: EXTRACORPOREAL SHOCK WAVE LITHOTRIPSY (ESWL);  Surgeon: [name], MD;  Location: [facility];  Service: Urology;  Laterality: Right;  Local anesthesia   FRACTURE SURGERY  1990   right hip and femur   HERNIA REPAIR  07/2009   umbilical hernia/ got infected   JOINT REPLACEMENT Right 1990   right hip   TRACHEOSTOMY  08/1999   Had pneumoniaand was in a coma 10 days   WISDOM TOOTH EXTRACTION      FAMILY HISTORY: Family History  Problem Relation Age of Onset   Heart disease Mother    Diabetes Father    Heart disease Brother    Hypertension Neg Hx    Stroke Neg Hx    Colon cancer Neg Hx    Colon polyps Neg Hx    Crohn's disease Neg Hx    Esophageal cancer Neg Hx    Rectal cancer Neg Hx    Stomach cancer Neg Hx    Ulcerative colitis Neg Hx     SOCIAL HISTORY: Social History   Socioeconomic History   Marital status: Divorced    Spouse name: Not on file   Number of children: 1   Years of education: 14   Highest education level: 12th grade  Occupational History   Occupation: diability  Tobacco Use   Smoking status: Every Day    Current packs/day: 1.00    Average packs/day: 1 pack/day for 30.0 years (30.0 ttl pk-yrs)    Types: Cigarettes    Passive exposure: Current   Smokeless tobacco: Never  Vaping Use   Vaping status: Never Used  Substance and Sexual Activity   Alcohol use: Not Currently    Comment: history of alcohol abuse.   Drug use: Not Currently  Comment: Hx of cocaine use "years ago"   Sexual activity: Not Currently  Other Topics Concern   Not on file  Social History Narrative   Patient lives alone. His father recently passed away.    A lot of stressors are coming from his passing.    Patient can not drive. Utilizes friends, taxis or medicare transportation.     Enjoys watching tv and working in the yard.    Social Drivers of Corporate investment banker Strain: Low Risk  (09/30/2023)   Overall Financial Resource Strain (CARDIA)    Difficulty of Paying Living Expenses: Not hard at all  Food Insecurity: No Food Insecurity (05/17/2023)   Hunger Vital Sign    Worried About Running Out of Food in the Last Year: Never true    Ran Out of Food in the Last Year: Never true  Transportation Needs: No Transportation Needs (05/17/2023)   PRAPARE - Administrator, Civil Service (Medical): No    Lack of Transportation (Non-Medical): No  Physical Activity: Insufficiently Active (09/30/2023)   Exercise Vital Sign    Days of Exercise per Week: 2 days    Minutes of Exercise per Session: 20 min  Stress: Stress Concern Present (09/30/2023)   [employer] of Occupational Health - Occupational Stress Questionnaire    Feeling of Stress : To some extent  Social Connections: Socially Isolated (09/30/2023)   Social Connection and Isolation Panel [NHANES]    Frequency of Communication with Friends and Family: Twice a week    Frequency of Social Gatherings with Friends and Family: Twice a week    Attends Religious Services: Never    Database administrator or Organizations: No    Attends Banker Meetings: Never    Marital Status: Divorced  Catering manager Violence: Not At Risk (05/03/2023)   Humiliation, Afraid, Rape, and Kick questionnaire    Fear of Current or Ex-Partner: No    Emotionally Abused: No    Physically Abused: No    Sexually Abused: No   PHYSICAL EXAM  Vitals:   02/01/24 0832  BP: 126/74  Pulse: 88  Weight: 175 lb (79.4 kg)  Height: 5\' 11"  (1.803 m)   Body mass index is 24.41 kg/m.  Generalized: Well developed, in no acute distress  Neurological examination  Mentation: Alert oriented to time, place, history taking. Follows all commands speech and language fluent. Quiet, speech has slight slur  Cranial nerve  II-XII: Pupils were equal round reactive to light. Extraocular movements were full, visual field were full on confrontational test. Facial sensation and strength were normal. Head turning and shoulder shrug  were normal and symmetric. Noted to be smacking his lips claims to keep dentures in place  Motor: The motor testing reveals 5 over 5 strength of all 4 extremities. Good symmetric motor tone is noted throughout.  Sensory: Sensory testing is intact to soft touch on all 4 extremities. No evidence of extinction is noted.  Coordination: Cerebellar testing reveals good finger-nose-finger and heel-to-shin bilaterally.  Gait and station: Gait is normal. Tandem gait is unsteady Reflexes: Deep tendon reflexes are symmetric and normal bilaterally.   DIAGNOSTIC DATA (LABS, IMAGING, TESTING) - I reviewed patient records, labs, notes, testing and imaging myself where available.  Lab Results  Component Value Date   WBC 8.2 10/19/2023   HGB 14.3 10/19/2023   HCT 44.3 10/19/2023   MCV 90 10/19/2023   PLT 362 10/19/2023      Component Value Date/Time  NA 141 10/19/2023 1635   K 4.7 10/19/2023 1635   CL 104 10/19/2023 1635   CO2 24 10/19/2023 1635   GLUCOSE 82 10/19/2023 1635   GLUCOSE 99 03/22/2023 1339   BUN 15 10/19/2023 1635   CREATININE 1.25 10/19/2023 1635   CREATININE 0.94 06/10/2016 1613   CALCIUM 10.0 10/19/2023 1635   PROT 7.5 10/19/2023 1635   ALBUMIN 4.7 10/19/2023 1635   AST 18 10/19/2023 1635   ALT 16 10/19/2023 1635   ALKPHOS 86 10/19/2023 1635   BILITOT <0.2 10/19/2023 1635   GFRNONAA >60 03/22/2023 1339   GFRNONAA 84 02/17/2016 1011   GFRAA 65 10/17/2020 1013   GFRAA >89 02/17/2016 1011   Lab Results  Component Value Date   CHOL 134 12/13/2022   HDL 36 (L) 12/13/2022   LDLCALC 72 12/13/2022   TRIG 146 12/13/2022   CHOLHDL 3.7 12/13/2022   Lab Results  Component Value Date   HGBA1C 5.9 (H) 10/19/2023   Lab Results  Component Value Date   VITAMINB12 473  02/06/2016   Lab Results  Component Value Date   TSH 1.590 10/19/2023   EEG 01/17/2023 Frequent left temporal sharps  Left temporal slowing    ASSESSMENT AND PLAN 68 y.o. year old male  has a past medical history of Acute kidney injury (HCC) (05/26/2018), Allergy, Anemia, Anxiety, Arthritis, Cataract, CKD (chronic kidney disease), stage III (HCC), Depression, History of kidney stones, Hyperlipidemia, Hypertension, essential, benign (08/02/2012), Orthostatic hypotension (10/09/2013), OSA (obstructive sleep apnea) (10/01/2015), Pneumonia, Rhabdomyolysis (08/24/2022), Seizures (HCC), Sleep apnea, and Substance abuse (HCC). here with:  1.  Seizure disorder 2.  Orthostatic hypotension 3.  Subdural hematoma post craniotomy July 2023 4.  Bipolar disorder  -Breakthrough seizure 2 weeks ago -Increase Vimpat 150 mg twice daily  -Continue Keppra 750 mg 2 tablets twice daily  -Continue Lyrica 75 mg twice a day, should be due for refill in April  -Check routine labs today -Call for seizures, follow-up in 6 months with Dr. Teresa Coombs to rotate visits with me  Meds ordered this encounter  Medications   Lacosamide (VIMPAT) 150 MG TABS    Sig: Take 1 tablet (150 mg total) by mouth in the morning and at bedtime.    Dispense:  180 tablet    Refill:  1    This is new dosing   levETIRAcetam (KEPPRA) 750 MG tablet    Sig: Take 2 tablets (1,500 mg total) by mouth 2 (two) times daily.    Dispense:  400 tablet    Refill:  2    Please send a replace/new response with 100-Day Supply if appropriate to maximize member benefit. Requesting 1 year supply.   Otila Kluver, DNP  [facility] [address], [address] [place], [place] [address] [phone]

## 2024-02-01 ENCOUNTER — Ambulatory Visit: Payer: 59 | Admitting: Neurology

## 2024-02-01 ENCOUNTER — Encounter: Payer: Self-pay | Admitting: Neurology

## 2024-02-01 VITALS — BP 126/74 | HR 88 | Ht 71.0 in | Wt 175.0 lb

## 2024-02-01 DIAGNOSIS — F319 Bipolar disorder, unspecified: Secondary | ICD-10-CM | POA: Diagnosis not present

## 2024-02-01 DIAGNOSIS — G40909 Epilepsy, unspecified, not intractable, without status epilepticus: Secondary | ICD-10-CM

## 2024-02-01 MED ORDER — LEVETIRACETAM 750 MG PO TABS: 1500.0000 mg | ORAL_TABLET | Freq: Two times a day (BID) | ORAL | 2 refills | Status: DC

## 2024-02-01 MED ORDER — LACOSAMIDE 150 MG PO TABS: 150.0000 mg | ORAL_TABLET | Freq: Two times a day (BID) | ORAL | 1 refills | Status: DC

## 2024-02-01 NOTE — Patient Instructions (Addendum)
 Increase the Vimpat (Lacosamide) to 150 mg twice daily, continue the Keppra 750 mg 2 tablets twice daily, Lyrica 75 mg twice daily. Check labs today. Call for seizures   Meds ordered this encounter  Medications   Lacosamide (VIMPAT) 150 MG TABS    Sig: Take 1 tablet (150 mg total) by mouth in the morning and at bedtime.    Dispense:  180 tablet    Refill:  1    This is new dosing   levETIRAcetam (KEPPRA) 750 MG tablet    Sig: Take 2 tablets (1,500 mg total) by mouth 2 (two) times daily.    Dispense:  400 tablet    Refill:  2    Please send a replace/new response with 100-Day Supply if appropriate to maximize member benefit. Requesting 1 year supply.

## 2024-02-02 ENCOUNTER — Other Ambulatory Visit: Payer: Self-pay | Admitting: *Deleted

## 2024-02-02 NOTE — Patient Instructions (Signed)
 Visit Information  Thank you for taking time to visit with me today. Please don't hesitate to contact me if I can be of assistance to you before our next scheduled telephone appointment.  Following are the goals we discussed today:   Goals Addressed             This Visit's Progress    RNCM Care Management Expected Outcome: Monitor, Self-Manage and Reduce Symptoms of: HTN, HLD, BPH       Current Barriers:  Knowledge Deficits related to plan of care for management of HTN, HLD, and BPH  Chronic Disease Management support and education needs related to HTN, HLD, and BPH   RNCM Clinical Goal(s):  Patient will verbalize basic understanding of  HTN, HLD, and BPH disease process and self health management plan as evidenced by verbal explanation, recognizing symptoms and continued lifestyle modifications  take all medications exactly as prescribed and will call provider for medication related questions as evidenced by compliance with all medications attend all scheduled medical appointments: with primary care provider and specialist as evidenced by keeping all scheduled appointments continue to work with RN Care Manager to address care management and care coordination needs related to  HTN, HLD, and BPH as evidenced by adherence to CM Team Scheduled appointments through collaboration with RN Care manager, provider, and care team.   Interventions: Evaluation of current treatment plan related to  self management and patient's adherence to plan as established by provider   BPH  (Status:  Condition stable.  Not addressed this visit.)  Long Term Goal Evaluation of current treatment plan related to BPH, self-management and patient's adherence to plan as established by provider.  Discussed plans with patient for ongoing care management follow up and provided patient with direct contact information for care management team Discussed the importance of regular follow-ups. Urology follow up  02-15-2024 Encouraged a diet rich in fruits, vegetables, and whole grains.Limit caffeine and alcohol intake. Advised patient to manage fluid intake Promote regular physical activity to improve overall health   Hypertension Interventions:  (Status:  Goal on track:  Yes.) Long Term Goal Last practice recorded BP readings:  BP Readings from Last 3 Encounters:  02/01/24 126/74  10/19/23 110/70  08/17/23 123/74   Most recent eGFR/CrCl:  Lab Results  Component Value Date   EGFR 74 02/01/2024    No components found for: "CRCL"  Evaluation of current treatment plan related to hypertension self management and patient's adherence to plan as established by provider. Reports stable BP within normal limits. Recently checked in office. Denies any chest pain, headaches, dizziness or swelling Provided education to patient re: stroke prevention, s/s of heart attack and stroke. Education and support provided Reviewed medications with patient and discussed importance of compliance. Reports compliance with all medications Counseled on adverse effects of illicit drug and excessive alcohol use in patients with high blood pressure  Counseled on the importance of exercise goals with target of 150 minutes per week. Plans to start walking when the weather improves Discussed plans with patient for ongoing care management follow up and provided patient with direct contact information for care management team Advised patient, providing education and rationale, to monitor blood pressure daily and record, calling PCP for findings outside established parameters Reviewed scheduled/upcoming provider appointments including:  Discussed complications of poorly controlled blood pressure such as heart disease, stroke, circulatory complications, vision complications, kidney impairment, sexual dysfunction Screening for signs and symptoms of depression related to chronic disease state  Hyperlipidemia Interventions:  (Status:   Goal on track:  Yes.) Long Term Goal Medication review performed; medication list updated in electronic medical record.  Provider established cholesterol goals reviewed Counseled on importance of regular laboratory monitoring as prescribed. Will have repeat labs in a few months due to increase. Provided HLD educational materials Reviewed role and benefits of statin for ASCVD risk reduction. Reports compliance with Lipitor.  Discussed strategies to manage statin-induced myalgias. No myalgias reported. Reviewed importance of limiting foods high in cholesterol. RNCM provided reminder to continue working on decreasing overly processed/prepackaged foods. Reviewed exercise goals and target of 150 minutes per week Screening for signs and symptoms of depression related to chronic disease state Assessed social determinant of health barriers   Lab Results  Component Value Date   CHOL 134 12/13/2022   HDL 36 (L) 12/13/2022   LDLCALC 72 12/13/2022   TRIG 146 12/13/2022   CHOLHDL 3.7 12/13/2022     Seizures Interventions  (Status:  Goal on track:  Yes.)  Long Term Goal Discussed plans with patient for ongoing care management follow up and provided patient with direct contact information for care management team Evaluation of current treatment plan related to seizures and patient's adherence to plan as established by provider Reviewed medications with patient and discussed compliance with the treatment plan as it relates to his increase in dosage Reviewed scheduled/upcoming provider appointments including 07-31-2024 with Neurology Discussed plans with patient for ongoing care management follow up and provided patient with direct contact information for care management team Advised patient to discuss frequency of seizures with provider Screening for signs and symptoms of depression related to chronic disease state    Patient Goals/Self-Care Activities: Take all medications as prescribed Attend all  scheduled provider appointments Call pharmacy for medication refills 3-7 days in advance of running out of medications Attend church or other social activities Call provider office for new concerns or questions  check blood pressure 3 times per week write blood pressure results in a log or diary call doctor for signs and symptoms of high blood pressure begin an exercise program eat more whole grains, fruits and vegetables, lean meats and healthy fats  Follow Up Plan:  Telephone follow up appointment with care management team member scheduled for:  03-06-2024 at 10:00 am           Our next appointment is by telephone on 03-06-2024 at 10:00 am  Please call the care guide team at [phone] if you need to cancel or reschedule your appointment.   If you are experiencing a Mental Health or Behavioral Health Crisis or need someone to talk to, please call the Suicide and Crisis Lifeline: 988 call the [place] National Suicide Prevention Lifeline: 361-587-2047 or TTY: 6827918459 TTY 959 744 6172) to talk to a trained counselor call 1-800-273-TALK (toll free, 24 hour hotline) go to [facility] [address] [place] [phone])   Patient verbalizes understanding of instructions and care plan provided today and agrees to view in MyChart. Active MyChart status and patient understanding of how to access instructions and care plan via MyChart confirmed with patient.     Telephone follow up appointment with care management team member scheduled for:03-06-2024 at 10:00 am  Larey Brick, BSN RN [facility], [facility] RN Care Manager Direct Dial: [phone]  Fax: [phone]

## 2024-02-02 NOTE — Patient Outreach (Signed)
 Care Management   Visit Note  02/02/2024 Name: Jose Li MRN: [id number] DOB: [birth year]  Subjective: Jose Li is a 68 y.o. year old male who is a primary care patient of Jose Kinds, MD. The Care Management team was consulted for assistance.      Engaged with patient spoke with patient by telephone.    Goals Addressed             This Visit's Progress    RNCM Care Management Expected Outcome: Monitor, Self-Manage and Reduce Symptoms of: HTN, HLD, BPH       Current Barriers:  Knowledge Deficits related to plan of care for management of HTN, HLD, and BPH  Chronic Disease Management support and education needs related to HTN, HLD, and BPH   RNCM Clinical Goal(s):  Patient will verbalize basic understanding of  HTN, HLD, and BPH disease process and self health management plan as evidenced by verbal explanation, recognizing symptoms and continued lifestyle modifications  take all medications exactly as prescribed and will call provider for medication related questions as evidenced by compliance with all medications attend all scheduled medical appointments: with primary care provider and specialist as evidenced by keeping all scheduled appointments continue to work with RN Care Manager to address care management and care coordination needs related to  HTN, HLD, and BPH as evidenced by adherence to CM Team Scheduled appointments through collaboration with RN Care manager, provider, and care team.   Interventions: Evaluation of current treatment plan related to  self management and patient's adherence to plan as established by provider   BPH  (Status:  Condition stable.  Not addressed this visit.)  Long Term Goal Evaluation of current treatment plan related to BPH, self-management and patient's adherence to plan as established by provider.  Discussed plans with patient for ongoing care management follow up and provided patient with direct contact information for care  management team Discussed the importance of regular follow-ups. Urology follow up 02-15-2024 Encouraged a diet rich in fruits, vegetables, and whole grains.Limit caffeine and alcohol intake. Advised patient to manage fluid intake Promote regular physical activity to improve overall health   Hypertension Interventions:  (Status:  Goal on track:  Yes.) Long Term Goal Last practice recorded BP readings:  BP Readings from Last 3 Encounters:  02/01/24 126/74  10/19/23 110/70  08/17/23 123/74   Most recent eGFR/CrCl:  Lab Results  Component Value Date   EGFR 74 02/01/2024    No components found for: "CRCL"  Evaluation of current treatment plan related to hypertension self management and patient's adherence to plan as established by provider. Reports stable BP within normal limits. Recently checked in office. Denies any chest pain, headaches, dizziness or swelling Provided education to patient re: stroke prevention, s/s of heart attack and stroke. Education and support provided Reviewed medications with patient and discussed importance of compliance. Reports compliance with all medications Counseled on adverse effects of illicit drug and excessive alcohol use in patients with high blood pressure  Counseled on the importance of exercise goals with target of 150 minutes per week. Plans to start walking when the weather improves Discussed plans with patient for ongoing care management follow up and provided patient with direct contact information for care management team Advised patient, providing education and rationale, to monitor blood pressure daily and record, calling PCP for findings outside established parameters Reviewed scheduled/upcoming provider appointments including:  Discussed complications of poorly controlled blood pressure such as heart disease, stroke, circulatory complications, vision complications,  kidney impairment, sexual dysfunction Screening for signs and symptoms of  depression related to chronic disease state    Hyperlipidemia Interventions:  (Status:  Goal on track:  Yes.) Long Term Goal Medication review performed; medication list updated in electronic medical record.  Provider established cholesterol goals reviewed Counseled on importance of regular laboratory monitoring as prescribed. Will have repeat labs in a few months due to increase. Provided HLD educational materials Reviewed role and benefits of statin for ASCVD risk reduction. Reports compliance with Lipitor.  Discussed strategies to manage statin-induced myalgias. No myalgias reported. Reviewed importance of limiting foods high in cholesterol. RNCM provided reminder to continue working on decreasing overly processed/prepackaged foods. Reviewed exercise goals and target of 150 minutes per week Screening for signs and symptoms of depression related to chronic disease state Assessed social determinant of health barriers   Lab Results  Component Value Date   CHOL 134 12/13/2022   HDL 36 (L) 12/13/2022   LDLCALC 72 12/13/2022   TRIG 146 12/13/2022   CHOLHDL 3.7 12/13/2022     Seizures Interventions  (Status:  Goal on track:  Yes.)  Long Term Goal Discussed plans with patient for ongoing care management follow up and provided patient with direct contact information for care management team Evaluation of current treatment plan related to seizures and patient's adherence to plan as established by provider Reviewed medications with patient and discussed compliance with the treatment plan as it relates to his increase in dosage Reviewed scheduled/upcoming provider appointments including 07-31-2024 with Neurology Discussed plans with patient for ongoing care management follow up and provided patient with direct contact information for care management team Advised patient to discuss frequency of seizures with provider Screening for signs and symptoms of depression related to chronic disease state     Patient Goals/Self-Care Activities: Take all medications as prescribed Attend all scheduled provider appointments Call pharmacy for medication refills 3-7 days in advance of running out of medications Attend church or other social activities Call provider office for new concerns or questions  check blood pressure 3 times per week write blood pressure results in a log or diary call doctor for signs and symptoms of high blood pressure begin an exercise program eat more whole grains, fruits and vegetables, lean meats and healthy fats  Follow Up Plan:  Telephone follow up appointment with care management team member scheduled for:  03-06-2024 at 10:00 am              Consent to Services:  Patient was given information about care management services, agreed to services, and gave verbal consent to participate.   Plan: Telephone follow up appointment with care management team member scheduled for:03-06-2024 at 10:00 am  Larey Brick, BSN RN [facility], [facility] RN Care Manager Direct Dial: [phone]  Fax: [phone]

## 2024-02-03 ENCOUNTER — Encounter: Payer: Self-pay | Admitting: Neurology

## 2024-02-03 LAB — LEVETIRACETAM LEVEL: Levetiracetam Lvl: 61.8 ug/mL — ABNORMAL HIGH (ref 10.0–40.0)

## 2024-02-03 LAB — CBC WITH DIFFERENTIAL/PLATELET
Basophils Absolute: 0.1 10*3/uL (ref 0.0–0.2)
Basos: 1 %
EOS (ABSOLUTE): 0.1 10*3/uL (ref 0.0–0.4)
Eos: 2 %
Hematocrit: 41 % (ref 37.5–51.0)
Hemoglobin: 13.4 g/dL (ref 13.0–17.7)
Immature Grans (Abs): 0 10*3/uL (ref 0.0–0.1)
Immature Granulocytes: 0 %
Lymphocytes Absolute: 1.6 10*3/uL (ref 0.7–3.1)
Lymphs: 21 %
MCH: 29.1 pg (ref 26.6–33.0)
MCHC: 32.7 g/dL (ref 31.5–35.7)
MCV: 89 fL (ref 79–97)
Monocytes Absolute: 0.6 10*3/uL (ref 0.1–0.9)
Monocytes: 8 %
Neutrophils Absolute: 5 10*3/uL (ref 1.4–7.0)
Neutrophils: 68 %
Platelets: 326 10*3/uL (ref 150–450)
RBC: 4.61 x10E6/uL (ref 4.14–5.80)
RDW: 12.4 % (ref 11.6–15.4)
WBC: 7.4 10*3/uL (ref 3.4–10.8)

## 2024-02-03 LAB — COMPREHENSIVE METABOLIC PANEL
ALT: 13 IU/L (ref 0–44)
AST: 14 IU/L (ref 0–40)
Albumin: 4.5 g/dL (ref 3.9–4.9)
Alkaline Phosphatase: 80 IU/L (ref 44–121)
BUN/Creatinine Ratio: 7 — ABNORMAL LOW (ref 10–24)
BUN: 8 mg/dL (ref 8–27)
Bilirubin Total: 0.3 mg/dL (ref 0.0–1.2)
CO2: 21 mmol/L (ref 20–29)
Calcium: 9.4 mg/dL (ref 8.6–10.2)
Chloride: 104 mmol/L (ref 96–106)
Creatinine, Ser: 1.1 mg/dL (ref 0.76–1.27)
Globulin, Total: 2.5 g/dL (ref 1.5–4.5)
Glucose: 104 mg/dL — ABNORMAL HIGH (ref 70–99)
Potassium: 4.3 mmol/L (ref 3.5–5.2)
Sodium: 140 mmol/L (ref 134–144)
Total Protein: 7 g/dL (ref 6.0–8.5)
eGFR: 74 mL/min/{1.73_m2} (ref >59)

## 2024-02-03 LAB — LACOSAMIDE: Lacosamide: 4.6 ug/mL — ABNORMAL LOW (ref 5.0–10.0)

## 2024-02-15 ENCOUNTER — Ambulatory Visit (INDEPENDENT_AMBULATORY_CARE_PROVIDER_SITE_OTHER): Payer: 59 | Admitting: Urology

## 2024-02-15 ENCOUNTER — Encounter: Payer: Self-pay | Admitting: Urology

## 2024-02-15 VITALS — BP 146/67 | HR 97

## 2024-02-15 DIAGNOSIS — N401 Enlarged prostate with lower urinary tract symptoms: Secondary | ICD-10-CM

## 2024-02-15 DIAGNOSIS — N200 Calculus of kidney: Secondary | ICD-10-CM

## 2024-02-15 LAB — URINALYSIS, ROUTINE W REFLEX MICROSCOPIC
Bilirubin, UA: NEGATIVE
Glucose, UA: NEGATIVE
Leukocytes,UA: NEGATIVE
Nitrite, UA: NEGATIVE
Protein,UA: NEGATIVE
RBC, UA: NEGATIVE
Specific Gravity, UA: 1.025 (ref 1.005–1.030)
Urobilinogen, Ur: 0.2 mg/dL (ref 0.2–1.0)
pH, UA: 5.5 (ref 5.0–7.5)

## 2024-02-15 LAB — MICROSCOPIC EXAMINATION

## 2024-02-15 NOTE — Progress Notes (Signed)
 Assessment: 1. Nephrolithiasis   2. Benign localized prostatic hyperplasia with lower urinary tract symptoms (LUTS)     Plan: Continue combination medical therapy for BPH Psa today Follow-up 1 year or sooner if problems arise  Chief Complaint: BPH/kidney stones  HPI: Jose Li is a 68 y.o. male who presents for continued evaluation of BPH and nephrolithiasis. Patient is on combination medical therapy with tamsulosin and finasteride reports stable lower urinary tract symptoms. Patient otherwise doing well without other complaints.  No recent colicky type pain suggestive of recurrent stone disease. Urinalysis today is negative for significant blood or infection     Patient is status post ureteroscopic management of a right proximal ureteral stone 03/29/2023.  This was found incidentally on a CT hematuria protocol for microscopic hematuria evaluation.  Portions of the above documentation were copied from a prior visit for review purposes only.  Allergies: Allergies  Allergen Reactions   Cymbalta [Duloxetine Hcl] Other (See Comments)    Hyponatremia    Erythromycin Other (See Comments)    Unknown reation   Hctz [Hydrochlorothiazide] Other (See Comments)    Syncope   Inderal [Propranolol] Other (See Comments)    Syncope     PMH: Past Medical History:  Diagnosis Date   Acute kidney injury (HCC) 05/26/2018   Allergy    Anemia    Anxiety    Arthritis    LEGS,BACK   Cataract    BILATERAL   CKD (chronic kidney disease), stage III (HCC)    Depression    History of kidney stones    Hyperlipidemia    Hypertension, essential, benign 08/02/2012   Orthostatic hypotension 10/09/2013   OSA (obstructive sleep apnea) 10/01/2015   Severe OSA with an AHI of 86/hr now on BiPAP at 23/19cm H2O   Pneumonia    Rhabdomyolysis 08/24/2022   Seizures (HCC)    none for last 6-8months   Sleep apnea    Substance abuse (HCC)     PSH: Past Surgical History:  Procedure Laterality  Date   COLONOSCOPY     CRANIOTOMY Left 06/08/2022   Procedure: CRANIOTOMY HEMATOMA EVACUATION SUBDURAL;  Surgeon: Julio Sicks, MD;  Location: [facility] OR;  Service: Neurosurgery;  Laterality: Left;   CYSTOSCOPY WITH RETROGRADE PYELOGRAM, URETEROSCOPY AND STENT PLACEMENT Right 03/29/2023   Procedure: CYSTOSCOPY WITH RETROGRADE PYELOGRAM, URETEROSCOPY AND STENT PLACEMENT;  Surgeon: [name], MD;  Location: [facility];  Service: Urology;  Laterality: Right;   EXTRACORPOREAL SHOCK WAVE LITHOTRIPSY Right 03/28/2023   Procedure: EXTRACORPOREAL SHOCK WAVE LITHOTRIPSY (ESWL);  Surgeon: [name], MD;  Location: [facility];  Service: Urology;  Laterality: Right;  Local anesthesia   FRACTURE SURGERY  1990   right hip and femur   HERNIA REPAIR  07/2009   umbilical hernia/ got infected   JOINT REPLACEMENT Right 1990   right hip   TRACHEOSTOMY  08/1999   Had pneumoniaand was in a coma 10 days   WISDOM TOOTH EXTRACTION      SH: Social History   Tobacco Use   Smoking status: Every Day    Current packs/day: 1.00    Average packs/day: 1 pack/day for 30.0 years (30.0 ttl pk-yrs)    Types: Cigarettes    Passive exposure: Current   Smokeless tobacco: Never  Vaping Use   Vaping status: Never Used  Substance Use Topics   Alcohol use: Not Currently    Comment: history of alcohol abuse.   Drug use: Not Currently    Comment:  Hx of cocaine use "years ago"    ROS: Constitutional:  Negative for fever, chills, weight loss CV: Negative for chest pain, previous MI, hypertension Respiratory:  Negative for shortness of breath, wheezing, sleep apnea, frequent cough GI:  Negative for nausea, vomiting, bloody stool, GERD  PE: Vitals:   02/15/24 1035  BP: (!) 146/67  Pulse: 97    GENERAL APPEARANCE:  Well appearing, well developed, well nourished, NAD    Results: UA-negative for significant blood or infection

## 2024-02-16 LAB — PSA: Prostate Specific Ag, Serum: 0.3 ng/mL (ref 0.0–4.0)

## 2024-03-02 ENCOUNTER — Other Ambulatory Visit: Payer: Self-pay | Admitting: Internal Medicine

## 2024-03-05 ENCOUNTER — Other Ambulatory Visit: Payer: Self-pay | Admitting: Pharmacist Clinician (PhC)/ Clinical Pharmacy Specialist

## 2024-03-05 DIAGNOSIS — N1832 Chronic kidney disease, stage 3b: Secondary | ICD-10-CM | POA: Diagnosis not present

## 2024-03-05 MED ORDER — MIDODRINE HCL 5 MG PO TABS: ORAL_TABLET | ORAL | 3 refills | Status: DC

## 2024-03-06 ENCOUNTER — Other Ambulatory Visit: Payer: Self-pay

## 2024-03-06 NOTE — Patient Instructions (Signed)
 Visit Information  Thank you for taking time to visit with me today. Please don't hesitate to contact me if I can be of assistance to you before our next scheduled appointment.  Your next care management appointment is by telephone on Tuesday, May 6th  at 10:00am  REMEMBER: (1) report any worsening pain or blood with urination to Urologist.  (2) Slowly INCREASE your fluid intake, drinking non-sugar drinks, to keep your urine light yellow - staying hydrated can POSSIBLY help prevent kidney stones, gives you more energy, and may help ease dizziness/lightheadedness.     Please call the care guide team at [phone] if you need to cancel, schedule, or reschedule an appointment.   Please  if you are experiencing a Mental Health or Behavioral Health Crisis or need someone to talk to.  Jose Li BSN, CCM [place]  [facility] Population [facility] RN Care Manager Direct Dial: [phone]  Fax: [phone]

## 2024-03-06 NOTE — Patient Outreach (Signed)
 Complex Care Management   Visit Note  03/06/2024  Name:  Jose Li MRN: [id number] DOB: March 01, 1956  Situation: Referral received for Complex Care Management related to  HTN, HLD, BPH  I obtained verbal consent from Patient.  Visit completed with patient  on the phone  Background:   Past Medical History:  Diagnosis Date   Acute kidney injury (HCC) 05/26/2018   Allergy    Anemia    Anxiety    Arthritis    LEGS,BACK   Cataract    BILATERAL   CKD (chronic kidney disease), stage III (HCC)    Depression    History of kidney stones    Hyperlipidemia    Hypertension, essential, benign 08/02/2012   Orthostatic hypotension 10/09/2013   OSA (obstructive sleep apnea) 10/01/2015   Severe OSA with an AHI of 86/hr now on BiPAP at 23/19cm H2O   Pneumonia    Rhabdomyolysis 08/24/2022   Seizures (HCC)    none for last 6-34months   Sleep apnea    Substance abuse (HCC)     Assessment: Patient Reported Symptoms:  Cognitive Cognitive Status: Alert and oriented to person, place, and time      Neurological Neurological Review of Symptoms: Dizziness (Occasional dizziness but at baseline for him, he takes midodrine  as ordered for low BP's.)    HEENT HEENT Symptoms Reported: No symptoms reported (See eye MD once  a year to monitor cataracts)      Cardiovascular Cardiovascular Symptoms Reported: Dizziness (Hypotension, takes midodrine  as ordered.) Does patient have uncontrolled Hypertension?: No Cardiovascular Management Strategies: Medication therapy Cardiovascular Self-Management Outcome: 4 (good)  Respiratory Respiratory Symptoms Reported: No symptoms reported    Endocrine Patient reports the following symptoms related to hypoglycemia or hyperglycemia : No symptoms reported    Gastrointestinal Gastrointestinal Symptoms Reported: No symptoms reported      Genitourinary Genitourinary Symptoms Reported: Pain with urination (Urologist aware, patient will report worsening pain or if pain  doesn't improve by appointment with nephrologist on 03/12/24) Genitourinary Conditions: Neurogenic bladder  Integumentary Integumentary Symptoms Reported: No symptoms reported    Musculoskeletal Musculoskelatal Symptoms Reviewed: Unsteady gait (Uses 'Hurry Cane" most of the time with ambulation.) Musculoskeletal Management Strategies: Medical device Musculoskeletal Self-Management Outcome: 4 (good) Falls in the past year?: Yes Number of falls in past year: 1 or less Was there an injury with Fall?: No Fall Risk Category Calculator: 1 Patient Fall Risk Level: Low Fall Risk Fall risk Follow up: Falls prevention discussed, Education provided  Psychosocial Psychosocial Symptoms Reported: No symptoms reported     Quality of Family Relationships: helpful, supportive      03/06/2024   10:38 AM  Depression screen PHQ 2/9  Decreased Interest 0  Down, Depressed, Hopeless 1  PHQ - 2 Score 1    There were no vitals filed for this visit.  Medications Reviewed Today     Reviewed by Isadore Marble, RN (Registered Nurse) on 03/06/24 at 1032  Med List Status: <None>   Medication Order Taking? Sig Documenting Provider Last Dose Status Informant  acetaminophen  (TYLENOL ) 325 MG tablet [id number] Yes Take 2 tablets (650 mg total) by mouth every 4 (four) hours as needed for mild pain (temp > 100.5).  Patient taking differently: Take 1,300 mg by mouth every 4 (four) hours as needed for mild pain (pain score 1-3) (temp > 100.5). Arthritis strength   Henreitta Locus D, NP Taking Active Self  aspirin  81 MG EC tablet [id number] Yes Take 81 mg by mouth daily. [provider] Taking Active Self  atorvastatin  (LIPITOR) 40 MG tablet [id number] Yes Take 1 tablet (40 mg total) by mouth daily. Elmyra Haggard, MD Taking Active   benztropine  (COGENTIN ) 1 MG tablet [id number] Yes Take 1 mg by mouth at bedtime. [provider] Taking Active Self  Bismuth Subsalicylate (KAOPECTATE) 262 MG TABS [id number]  Yes Take 262 mg by mouth every 6 (six) hours as needed (Diarrhea). [provider] Taking Active Self  bismuth subsalicylate (PEPTO BISMOL) 262 MG/15ML suspension [id number] Yes Take 30 mLs by mouth every 6 (six) hours as needed for diarrhea or loose stools. [provider] Taking Active Self  Boswellia-Glucosamine-Vit D (OSTEO BI-FLEX ONE PER DAY PO) [id number] Yes Take 1 tablet by mouth daily. [provider] Taking Active Self  busPIRone  (BUSPAR ) 5 MG tablet [id number] Yes Take 5 mg by mouth 2 (two) times daily.  [provider] Taking Active Self  cetirizine  (ZYRTEC ) 10 MG tablet [id number] Yes Take 10 mg by mouth daily. [provider] Taking Active Self  citalopram  (CELEXA ) 20 MG tablet [id number] Yes Take 7 mg by mouth daily. [provider] Taking Active Self  Ensure [facility]) [id number] Yes Take 237 mLs by mouth daily. Chocolate [provider] Taking Active Self  finasteride  (PROSCAR ) 5 MG tablet [id number] Yes TAKE 1 TABLET BY MOUTH DAILY Scarlet Curly, MD Taking Active   Lacosamide  (VIMPAT ) 150 MG TABS [id number] Yes Take 1 tablet (150 mg total) by mouth in the morning and at bedtime. Wess Hammed, NP Taking Active   levETIRAcetam  (KEPPRA ) 750 MG tablet [id number] Yes Take 2 tablets (1,500 mg total) by mouth 2 (two) times daily. Wess Hammed, NP Taking Active   midodrine  (PROAMATINE ) 5 MG tablet [id number] Yes TAKE 2 TABLETS BY MOUTH DAILY AT 6AM, 1 TABLET AT 10 AM, THEN 1 TABLET AT Elayne Greaser, MD Taking Active   Multiple Vitamin (MULTIVITAMIN) tablet [id number] Yes Take 1 tablet by mouth daily. [provider] Taking Active Self  potassium chloride  (KLOR-CON ) 10 MEQ tablet [id number] Yes Take 10 mEq by mouth daily. [provider] Taking Active   pregabalin  (LYRICA ) 75 MG capsule [id number] Yes TAKE 1 CAPSULE BY MOUTH TWICE  DAILY Wess Hammed, NP Taking Active   risperiDONE  (RISPERDAL ) 2 MG tablet [id number]  Yes Take 2 mg by mouth at bedtime.  [provider] Taking Active Self  tamsulosin  (FLOMAX ) 0.4 MG CAPS capsule [id number] Yes Take 0.4 mg by mouth daily after supper. [provider] Taking Active Self  thiamine  250 MG tablet [id number] Yes Take 250 mg by mouth daily. [provider] Taking Active Self  traZODone  (DESYREL ) 100 MG tablet [id number] Yes Take 200 mg by mouth at bedtime. [provider] Taking Active Self           Med Note Arby Beam   Tue Jan 27, 2016  8:59 PM)              Recommendation:   Specialty provider follow-up Nephrology 03/12/24, PCP 05/07/24, Neurology 07/31/24, Cardiology around September 2025,  Educated patient on staying hydrated throughout the day, hydration may help prevent kidney stones. Neurology started him on Vimpat  150mg  BID due to seizure breakthrough, denies unpleasant side effects, denies seizure since starting. He will report seizure activity to Neuro.    Follow Up Plan:   Telephone follow up appointment date/time:  Tuesday, May 6th at 10:00am.   Jurline Olmsted BSN, CCM [place]  [facility] Population Health RN  Care Manager Direct Dial: [phone]  Fax: [phone]

## 2024-03-12 DIAGNOSIS — I129 Hypertensive chronic kidney disease with stage 1 through stage 4 chronic kidney disease, or unspecified chronic kidney disease: Secondary | ICD-10-CM | POA: Diagnosis not present

## 2024-03-12 DIAGNOSIS — E876 Hypokalemia: Secondary | ICD-10-CM | POA: Diagnosis not present

## 2024-03-12 DIAGNOSIS — N1831 Chronic kidney disease, stage 3a: Secondary | ICD-10-CM | POA: Diagnosis not present

## 2024-03-12 DIAGNOSIS — G909 Disorder of the autonomic nervous system, unspecified: Secondary | ICD-10-CM | POA: Diagnosis not present

## 2024-03-17 ENCOUNTER — Other Ambulatory Visit: Payer: Self-pay | Admitting: Neurology

## 2024-03-19 ENCOUNTER — Telehealth: Payer: Self-pay | Admitting: Neurology

## 2024-03-19 NOTE — Telephone Encounter (Signed)
 Pt called in regards to refill for pregabalin  (LYRICA ) 75 MG capsule Informed patient refill was sent today at 10:58 am

## 2024-03-19 NOTE — Telephone Encounter (Signed)
 Requested Prescriptions   Pending Prescriptions Disp Refills   pregabalin  (LYRICA ) 75 MG capsule [Pharmacy Med Name: Pregabalin  75 MG Oral Capsule] 180 capsule     Sig: TAKE 1 CAPSULE BY MOUTH TWICE  DAILY   Last seen 02/01/24 Next appt 07/31/24  Dispenses   Dispensed Days Supply Quantity Provider Pharmacy  PREGABALIN   75 MG CAPS 12/08/2023 90 180 capsule  [facility]  PREGABALIN   75 MG CAPS 09/05/2023 90 180 capsule Wess Hammed, NP [facility]  PREGABALIN   75 MG CAPS 05/28/2023 90 180 capsule Wess Hammed, NP [facility]

## 2024-03-20 ENCOUNTER — Telehealth: Payer: Self-pay

## 2024-03-20 ENCOUNTER — Other Ambulatory Visit: Payer: Self-pay

## 2024-04-03 ENCOUNTER — Telehealth: Payer: Self-pay

## 2024-04-03 ENCOUNTER — Ambulatory Visit

## 2024-04-03 NOTE — Telephone Encounter (Signed)
 Patient calls nurse line requesting an apt.   He reports he has been having nausea, vomiting and diarrhea since Saturday.   He reports RQ abdominal pain. He denies any tenderness or distention to the area. He denies any blood in his stool or vomit. He denies any fevers. He reports 99.1 was his most recent.   He reports he has minimal appetite, however has been able to keep fluids down.   Patient advised evaluation today. He reports transportation issues, however he will call around for a ride.   Patient scheduled for today at 2:10pm.  Patient advised to go to [facility] or ED if unable to be seen today.

## 2024-04-06 ENCOUNTER — Ambulatory Visit (INDEPENDENT_AMBULATORY_CARE_PROVIDER_SITE_OTHER): Admitting: Student

## 2024-04-06 VITALS — BP 127/69 | HR 105 | Temp 98.2°F | Ht 71.0 in | Wt 170.2 lb

## 2024-04-06 DIAGNOSIS — K529 Noninfective gastroenteritis and colitis, unspecified: Secondary | ICD-10-CM | POA: Diagnosis not present

## 2024-04-06 MED ORDER — MIDODRINE HCL 10 MG PO TABS: 10.0000 mg | ORAL_TABLET | Freq: Three times a day (TID) | ORAL | 0 refills | Status: DC

## 2024-04-06 NOTE — Patient Instructions (Addendum)
 It was great to see you! Thank you for allowing me to participate in your care!  I recommend that you always bring your medications to each appointment as this makes it easy to ensure we are on the correct medications and helps us  not miss when refills are needed.  Our plans for today:  - GI symptoms It looks like you are having a gastroenteritis, a GI bug that is causing you to have vomiting and diarrhea.  This should get better on its own over the next week.   Drink fluids and stay hydrated at home, stay off your feet if you can avoid it.   Make follow up appointment for Monday   Take care and seek immediate care sooner if you develop any concerns.   Dr. Wilhemena Harbour, MD [facility] Medicine

## 2024-04-06 NOTE — Progress Notes (Signed)
  SUBJECTIVE:   CHIEF COMPLAINT / HPI:   GI symptoms He has been experiencing gastrointestinal symptoms for the past week, including vomiting and diarrhea occurring two to three times daily. The diarrhea is watery without a foul smell, and there is no blood in the vomit or diarrhea. He notes associated abdominal pain occurring between episodes of vomiting and diarrhea.  He has experienced no fevers, with temperatures reaching 68F on two occasions. He denies any recent changes in diet or fluid intake that could have triggered these symptoms. He has been using Imodium to manage the diarrhea, which provides some relief. He reports difficulty keeping down fluids and solids, with a significant loss of appetite.  He experiences occasional lightheadedness, particularly when standing up quickly, which is a recent development.  PERTINENT  PMH / PSH:   OBJECTIVE:  BP (!) 142/78   Pulse (!) 115   Temp 98.2 F (36.8 C) (Oral)   Ht 5\' 11"  (1.803 m)   Wt 170 lb 3.2 oz (77.2 kg)   SpO2 99%   BMI 23.74 kg/m  Physical Exam Abdominal:     General: Abdomen is flat. There is no distension.     Palpations: Abdomen is soft. There is no mass.     Tenderness: There is abdominal tenderness. There is no guarding or rebound.     Hernia: No hernia is present.  Skin:    Capillary Refill: Capillary refill takes 2 to 3 seconds.  Psychiatric:        Mood and Affect: Mood normal.        Behavior: Behavior normal.      ASSESSMENT/PLAN:   Assessment & Plan Gastroenteritis Patient comes in with concern of nausea, vomiting, diarrhea for the last 10 days or so, no blood in either, also not having fevers, or bodyaches.  Patient reports some dizziness with changing position, orthostatic vitals were but patient felt okay to go home for PO rehydration. Patient appreciates ability to continue oral hydration, but has lost most of his appetite.  Will recommend patient continue to use Pepto-Bismol, and Imodium as  needed, until symptoms improve.  Low concern for bacterial infection given lack of bloody diarrhea, or foul-smelling diarrhea.  Will recommend follow-up Monday. - Encouraged p.o. fluids - Continue Imodium/Pepto-Bismol as needed -Increase midodrine  dose to 2 in the morning, 1 in the afternoon, 2 in the evening x 2 days - Follow up Monday No follow-ups on file. Wilhemena Harbour, MD 04/06/2024, 3:32 PM PGY-3, [facility]

## 2024-04-06 NOTE — Assessment & Plan Note (Addendum)
 Patient comes in with concern of nausea, vomiting, diarrhea for the last 10 days or so, no blood in either, also not having fevers, or bodyaches.  Patient reports some dizziness with changing position, orthostatic vitals were but patient felt okay to go home for PO rehydration. Patient appreciates ability to continue oral hydration, but has lost most of his appetite.  Will recommend patient continue to use Pepto-Bismol, and Imodium as needed, until symptoms improve.  Low concern for bacterial infection given lack of bloody diarrhea, or foul-smelling diarrhea.  Will recommend follow-up Monday. - Encouraged p.o. fluids - Continue Imodium/Pepto-Bismol as needed -Increase midodrine  dose to 2 in the morning, 1 in the afternoon, 2 in the evening x 2 days - Follow up Monday

## 2024-04-20 ENCOUNTER — Other Ambulatory Visit: Payer: Self-pay | Admitting: Internal Medicine

## 2024-04-20 ENCOUNTER — Other Ambulatory Visit

## 2024-04-23 ENCOUNTER — Telehealth: Payer: Self-pay

## 2024-04-23 NOTE — Patient Outreach (Addendum)
 Complex Care Management   Visit Note  04/23/2024  Name:  Jose Li MRN: [id number] DOB: 08-Mar-1956  Situation: Referral received for Complex Care Management related to Hypotension, BPH I obtained verbal consent from Patient.  Visit completed with Jose Li  on the phone.  Report MD decreased midodrine  to twice daily, he denies symptoms of low BP today. He is experiencing neck pain, rates at 8/10, feels he strained his neck since having "stomach flu" where he had nausea/vomiting/diarrhea.    Background:   Past Medical History:  Diagnosis Date   Acute kidney injury (HCC) 05/26/2018   Allergy    Anemia    Anxiety    Arthritis    LEGS,BACK   Cataract    BILATERAL   CKD (chronic kidney disease), stage III (HCC)    Depression    History of kidney stones    Hyperlipidemia    Hypertension, essential, benign 08/02/2012   Orthostatic hypotension 10/09/2013   OSA (obstructive sleep apnea) 10/01/2015   Severe OSA with an AHI of 86/hr now on BiPAP at 23/19cm H2O   Pneumonia    Rhabdomyolysis 08/24/2022   Seizures (HCC)    none for last 6-54months   Sleep apnea    Substance abuse (HCC)     Assessment: Patient Reported Symptoms:  Cognitive Cognitive Status: Alert and oriented to person, place, and time, Normal speech and language skills, Insightful and able to interpret abstract concepts      Neurological Neurological Review of Symptoms: Dizziness (Occasional dizziness with sudden position changes, resolves after a few minutes.) Neurological Management Strategies: Medication therapy (Takes midodrine  twice a day) Neurological Self-Management Outcome: 4 (good)  HEENT HEENT Symptoms Reported: No symptoms reported      Cardiovascular Cardiovascular Symptoms Reported: Dizziness    Respiratory Respiratory Symptoms Reported: No symptoms reported    Endocrine Patient reports the following symptoms related to hypoglycemia or hyperglycemia : Not assessed    Gastrointestinal  Gastrointestinal Symptoms Reported: No symptoms reported      Genitourinary Genitourinary Symptoms Reported: Pain with urination (Urology aware, patient will report worsening pain with urination/blood in urine/odorous urine.)    Integumentary Integumentary Symptoms Reported: Not assessed    Musculoskeletal Musculoskelatal Symptoms Reviewed: Not assessed        Psychosocial Psychosocial Symptoms Reported: Not assessed            04/06/2024    3:31 PM  Depression screen PHQ 2/9  Decreased Interest 2  Down, Depressed, Hopeless 1  PHQ - 2 Score 3  Altered sleeping 2  Tired, decreased energy 2  Change in appetite 3  Feeling bad or failure about yourself  0  Trouble concentrating 0  Moving slowly or fidgety/restless 2  Suicidal thoughts 0  PHQ-9 Score 12    There were no vitals filed for this visit.  Medications Reviewed Today     Reviewed by Isadore Marble, RN (Registered Nurse) on 04/23/24 at 1348  Med List Status: <None>   Medication Order Taking? Sig Documenting Provider Last Dose Status Informant  acetaminophen  (TYLENOL ) 325 MG tablet [id number]  Take 2 tablets (650 mg total) by mouth every 4 (four) hours as needed for mild pain (temp > 100.5).  Patient taking differently: Take 1,300 mg by mouth every 4 (four) hours as needed for mild pain (pain score 1-3) (temp > 100.5). Arthritis strength   Henreitta Locus D, NP  Active Self  aspirin  81 MG EC tablet [id number]  Take 81 mg by mouth daily. [provider]  Active Self  atorvastatin  (LIPITOR) 40 MG tablet [id number]  TAKE 1 TABLET BY MOUTH DAILY Ross, Paula V, MD  Active   benztropine  (COGENTIN ) 1 MG tablet [id number]  Take 1 mg by mouth at bedtime. [provider]  Active Self  Bismuth Subsalicylate (KAOPECTATE) 262 MG TABS [id number]  Take 262 mg by mouth every 6 (six) hours as needed (Diarrhea). [provider]  Active Self  bismuth subsalicylate (PEPTO BISMOL) 262 MG/15ML suspension [id number]  Take  30 mLs by mouth every 6 (six) hours as needed for diarrhea or loose stools. [provider]  Active Self  Boswellia-Glucosamine-Vit D (OSTEO BI-FLEX ONE PER DAY PO) [id number]  Take 1 tablet by mouth daily. [provider]  Active Self  busPIRone  (BUSPAR ) 5 MG tablet [id number]  Take 5 mg by mouth 2 (two) times daily.  [provider]  Active Self  cetirizine  (ZYRTEC ) 10 MG tablet [id number]  Take 10 mg by mouth daily. [provider]  Active Self  citalopram  (CELEXA ) 20 MG tablet [id number]  Take 7 mg by mouth daily. [provider]  Active Self  Ensure (ENSURE) [id number]  Take 237 mLs by mouth daily. Chocolate [provider]  Active Self  finasteride  (PROSCAR ) 5 MG tablet [id number]  TAKE 1 TABLET BY MOUTH DAILY Scarlet Curly, MD  Active   Lacosamide  (VIMPAT ) 150 MG TABS [id number]  Take 1 tablet (150 mg total) by mouth in the morning and at bedtime. Wess Hammed, NP  Active   levETIRAcetam  (KEPPRA ) 750 MG tablet [id number]  Take 2 tablets (1,500 mg total) by mouth 2 (two) times daily. Wess Hammed, NP  Active   midodrine  (PROAMATINE ) 10 MG tablet [id number] Yes Take 1 tablet (10 mg total) by mouth 3 (three) times daily with meals. TAKE 2 TABLETS BY MOUTH DAILY AT 6AM, 1 TABLET AT 10 AM, THEN 1 TABLET AT Yevette Hem, MD Taking Active            Med Note Gomez Lathe, [place] A   Mon Apr 23, 2024  1:35 PM) Two tablets in AM, one tablet at lunch.    Multiple Vitamin (MULTIVITAMIN) tablet [id number]  Take 1 tablet by mouth daily. [provider]  Active Self  potassium chloride  (KLOR-CON ) 10 MEQ tablet [id number]  Take 10 mEq by mouth daily. [provider]  Active   pregabalin  (LYRICA ) 75 MG capsule [id number]  TAKE 1 CAPSULE BY MOUTH TWICE  DAILY Wess Hammed, NP  Active   risperiDONE  (RISPERDAL ) 2 MG tablet [id number]  Take 2 mg by mouth at bedtime.  [provider]  Active Self  tamsulosin  (FLOMAX ) 0.4 MG CAPS capsule  [id number]  Take 0.4 mg by mouth daily after supper. [provider]  Active Self  thiamine  250 MG tablet [id number]  Take 250 mg by mouth daily. [provider]  Active Self  traZODone  (DESYREL ) 100 MG tablet [id number]  Take 200 mg by mouth at bedtime. [provider]  Active Self           Med Note Lanis Pitcher, Juan Noel   Tue Jan 27, 2016  8:59 PM)              Recommendation:   Continue Current Plan of Care -Patient will monitor BP and report low BP's  if experiencing lightheadedness/dizziness due to MD decreased midodrine  to twice daily instead of three times a day.   -Patient will call Urology IF he experiences increased pain with  urination/blood in urine/odorous urine/difficulty starting stream/difficulty emptying bladder.  -Patient will use heat/ice for acute neck pain, discussed performing light stretches.  Will report to PCP if pain doesn't improve over the next week.  -He plans on attending upcoming PCP appt 05/07/24.   Follow Up Plan:   Telephone follow-up in 1 month  Jurline Olmsted BSN, CCM [place]  [facility] Population Health RN Care Manager Direct Dial: [phone]  Fax: [phone]

## 2024-04-23 NOTE — Patient Instructions (Signed)
 Visit Information  Thank you for taking time to visit with me today. Please don't hesitate to contact me if I can be of assistance to you before our next scheduled appointment.  Your next care management appointment is by telephone on 05/21/24 at 1:00pm  Please call the care guide team at [phone] if you need to cancel, schedule, or reschedule an appointment.   A reminder to ALL patients/family/friends, please call the [place]  National Suicide Prevention Lifeline: 3671057309 or TTY: 9798356058 TTY [phone]) to talk to a trained counselor if you are experiencing a Mental Health or Behavioral Health Crisis or need someone to talk to.  Jurline Olmsted BSN, CCM [place]  VBCI Population Health RN Care Manager Direct Dial: [phone]  Fax: [phone]

## 2024-04-24 ENCOUNTER — Encounter: Payer: Self-pay | Admitting: *Deleted

## 2024-05-07 ENCOUNTER — Ambulatory Visit: Payer: 59

## 2024-05-07 VITALS — Ht 71.0 in | Wt 170.0 lb

## 2024-05-07 DIAGNOSIS — Z0000 Encounter for general adult medical examination without abnormal findings: Secondary | ICD-10-CM | POA: Diagnosis not present

## 2024-05-07 NOTE — Progress Notes (Signed)
 Subjective:   Jose Li is a 67 y.o. who presents for a Medicare Wellness preventive visit.  As a reminder, Annual Wellness Visits don't include a physical exam, and some assessments may be limited, especially if this visit is performed virtually. We may recommend an in-person follow-up visit with your provider if needed.  Visit Complete: Virtual I connected with  Jose Li on 05/07/24 by a audio enabled telemedicine application and verified that I am speaking with the correct person using two identifiers.  Patient Location: Home  Provider Location: Home Office  I discussed the limitations of evaluation and management by telemedicine. The patient expressed understanding and agreed to proceed.  Vital Signs: Because this visit was a virtual/telehealth visit, some criteria may be missing or patient reported. Any vitals not documented were not able to be obtained and vitals that have been documented are patient reported.  VideoDeclined- This patient declined Librarian, academic. Therefore the visit was completed with audio only.  Persons Participating in Visit: Patient.  AWV Questionnaire: No: Patient Medicare AWV questionnaire was not completed prior to this visit.  Cardiac Risk Factors include: advanced age (>40men, >73 women);male gender;smoking/ tobacco exposure;hypertension;dyslipidemia     Objective:    Today's Vitals   05/07/24 1351  Weight: 170 lb (77.1 kg)  Height: 5' 11 (1.803 m)   Body mass index is 23.71 kg/m.     05/07/2024    1:59 PM 05/03/2023    2:14 PM 03/29/2023    1:29 PM 03/28/2023   11:45 AM 03/22/2023    1:20 PM 01/24/2023    2:35 PM 09/20/2022    1:52 PM  Advanced Directives  Does Patient Have a Medical Advance Directive? Yes Yes Yes Yes Yes No No  Type of Estate agent of [place];Living will Healthcare Power of [place];Living will Healthcare Power of [employer] Power of Attorney    Does  patient want to make changes to medical advance directive? No - Patient declined No - Patient declined No - Patient declined  No - Patient declined    Copy of Healthcare Power of Attorney in Chart? Yes - validated most recent copy scanned in chart (See row information) No - copy requested No - copy requested No - copy requested No - copy requested    Would patient like information on creating a medical advance directive?      No - Patient declined No - Patient declined    Current Medications (verified) Outpatient Encounter Medications as of 05/07/2024  Medication Sig   acetaminophen  (TYLENOL ) 325 MG tablet Take 2 tablets (650 mg total) by mouth every 4 (four) hours as needed for mild pain (temp > 100.5). (Patient taking differently: Take 1,300 mg by mouth every 4 (four) hours as needed for mild pain (pain score 1-3) (temp > 100.5). Arthritis strength)   aspirin  81 MG EC tablet Take 81 mg by mouth daily.   atorvastatin  (LIPITOR) 40 MG tablet TAKE 1 TABLET BY MOUTH DAILY   benztropine  (COGENTIN ) 1 MG tablet Take 1 mg by mouth at bedtime.   Bismuth Subsalicylate (KAOPECTATE) 262 MG TABS Take 262 mg by mouth every 6 (six) hours as needed (Diarrhea).   bismuth subsalicylate (PEPTO BISMOL) 262 MG/15ML suspension Take 30 mLs by mouth every 6 (six) hours as needed for diarrhea or loose stools.   Boswellia-Glucosamine-Vit D (OSTEO BI-FLEX ONE PER DAY PO) Take 1 tablet by mouth daily.   busPIRone  (BUSPAR ) 5 MG tablet Take 5 mg  by mouth 2 (two) times daily.    cetirizine  (ZYRTEC ) 10 MG tablet Take 10 mg by mouth daily.   citalopram  (CELEXA ) 20 MG tablet Take 7 mg by mouth daily.   Ensure (ENSURE) Take 237 mLs by mouth daily. Chocolate   finasteride  (PROSCAR ) 5 MG tablet TAKE 1 TABLET BY MOUTH DAILY   Lacosamide  (VIMPAT ) 150 MG TABS Take 1 tablet (150 mg total) by mouth in the morning and at bedtime.   levETIRAcetam  (KEPPRA ) 750 MG tablet Take 2 tablets (1,500 mg total) by mouth 2 (two) times daily.    midodrine  (PROAMATINE ) 10 MG tablet Take 1 tablet (10 mg total) by mouth 3 (three) times daily with meals. TAKE 2 TABLETS BY MOUTH DAILY AT 6AM, 1 TABLET AT 10 AM, THEN 1 TABLET AT 2PM   Multiple Vitamin (MULTIVITAMIN) tablet Take 1 tablet by mouth daily.   potassium chloride  (KLOR-CON ) 10 MEQ tablet Take 10 mEq by mouth daily.   pregabalin  (LYRICA ) 75 MG capsule TAKE 1 CAPSULE BY MOUTH TWICE  DAILY   risperiDONE  (RISPERDAL ) 2 MG tablet Take 2 mg by mouth at bedtime.    tamsulosin  (FLOMAX ) 0.4 MG CAPS capsule Take 0.4 mg by mouth daily after supper.   thiamine  250 MG tablet Take 250 mg by mouth daily.   traZODone  (DESYREL ) 100 MG tablet Take 200 mg by mouth at bedtime.   No facility-administered encounter medications on file as of 05/07/2024.    Allergies (verified) Cymbalta  [duloxetine  hcl], Erythromycin, Hctz [hydrochlorothiazide ], and Inderal  [propranolol ]   History: Past Medical History:  Diagnosis Date   Acute kidney injury (HCC) 05/26/2018   Allergy    Anemia    Anxiety    Arthritis    LEGS,BACK   Cataract    BILATERAL   CKD (chronic kidney disease), stage III (HCC)    Depression    History of kidney stones    Hyperlipidemia    Hypertension, essential, benign 08/02/2012   Orthostatic hypotension 10/09/2013   OSA (obstructive sleep apnea) 10/01/2015   Severe OSA with an AHI of 86/hr now on BiPAP at 23/19cm H2O   Pneumonia    Rhabdomyolysis 08/24/2022   Seizures (HCC)    none for last 6-22months   Sleep apnea    Substance abuse (HCC)    Past Surgical History:  Procedure Laterality Date   COLONOSCOPY     CRANIOTOMY Left 06/08/2022   Procedure: CRANIOTOMY HEMATOMA EVACUATION SUBDURAL;  Surgeon: Louis Shove, MD;  Location: [facility];  Service: Neurosurgery;  Laterality: Left;   CYSTOSCOPY WITH RETROGRADE PYELOGRAM, URETEROSCOPY AND STENT PLACEMENT Right 03/29/2023   Procedure: CYSTOSCOPY WITH RETROGRADE PYELOGRAM, URETEROSCOPY AND STENT PLACEMENT;  Surgeon: Shona Layman BROCKS,  MD;  Location: [facility];  Service: Urology;  Laterality: Right;   EXTRACORPOREAL SHOCK WAVE LITHOTRIPSY Right 03/28/2023   Procedure: EXTRACORPOREAL SHOCK WAVE LITHOTRIPSY (ESWL);  Surgeon: Shona Layman BROCKS, MD;  Location: [facility];  Service: Urology;  Laterality: Right;  Local anesthesia   FRACTURE SURGERY  1990   right hip and femur   HERNIA REPAIR  07/2009   umbilical hernia/ got infected   JOINT REPLACEMENT Right 1990   right hip   TRACHEOSTOMY  08/1999   Had pneumoniaand was in a coma 10 days   WISDOM TOOTH EXTRACTION     Family History  Problem Relation Age of Onset   Heart disease Mother    Diabetes Father    Heart disease Brother    Hypertension Neg Hx    Stroke  Neg Hx    Colon cancer Neg Hx    Colon polyps Neg Hx    Crohn's disease Neg Hx    Esophageal cancer Neg Hx    Rectal cancer Neg Hx    Stomach cancer Neg Hx    Ulcerative colitis Neg Hx    Social History   Socioeconomic History   Marital status: Divorced    Spouse name: Not on file   Number of children: 1   Years of education: 14   Highest education level: 12th grade  Occupational History   Occupation: diability  Tobacco Use   Smoking status: Every Day    Current packs/day: 1.00    Average packs/day: 1 pack/day for 30.0 years (30.0 ttl pk-yrs)    Types: Cigarettes    Passive exposure: Current   Smokeless tobacco: Never  Vaping Use   Vaping status: Never Used  Substance and Sexual Activity   Alcohol use: Not Currently    Comment: history of alcohol abuse.   Drug use: Not Currently    Comment: Hx of cocaine use years ago   Sexual activity: Not Currently  Other Topics Concern   Not on file  Social History Narrative   Patient lives alone. His father recently passed away.    A lot of stressors are coming from his passing.    Patient can not drive. Utilizes friends, taxis or medicare transportation.    Enjoys watching tv and working in the yard.    Social Drivers of Manufacturing engineer Strain: Low Risk  (05/07/2024)   Overall Financial Resource Strain (CARDIA)    Difficulty of Paying Living Expenses: Not hard at all  Food Insecurity: No Food Insecurity (05/07/2024)   Hunger Vital Sign    Worried About Running Out of Food in the Last Year: Never true    Ran Out of Food in the Last Year: Never true  Transportation Needs: Unmet Transportation Needs (05/07/2024)   PRAPARE - Administrator, Civil Service (Medical): Yes    Lack of Transportation (Non-Medical): No  Physical Activity: Inactive (05/07/2024)   Exercise Vital Sign    Days of Exercise per Week: 0 days    Minutes of Exercise per Session: 0 min  Stress: Stress Concern Present (05/07/2024)   [employer] of Occupational Health - Occupational Stress Questionnaire    Feeling of Stress: Rather much  Social Connections: Socially Isolated (05/07/2024)   Social Connection and Isolation Panel    Frequency of Communication with Friends and Family: Twice a week    Frequency of Social Gatherings with Friends and Family: Twice a week    Attends Religious Services: Never    Database administrator or Organizations: No    Attends Engineer, structural: Never    Marital Status: Divorced    Tobacco Counseling Ready to quit: Not Answered Counseling given: Not Answered    Clinical Intake:  Pre-visit preparation completed: Yes  Pain : No/denies pain     Diabetes: No  Lab Results  Component Value Date   HGBA1C 5.9 (H) 10/19/2023   HGBA1C 4.6 (L) 06/12/2022   HGBA1C 5.6 12/29/2015     How often do you need to have someone help you when you read instructions, pamphlets, or other written materials from your doctor or pharmacy?: 1 - Never  Interpreter Needed?: No  Information entered by :: Charmaine Bloodgood LPN   Activities of Daily Living     05/07/2024    1:51  PM  In your present state of health, do you have any difficulty performing the following activities:  Hearing?  0  Vision? 0  Difficulty concentrating or making decisions? 1  Walking or climbing stairs? 0  Dressing or bathing? 0  Doing errands, shopping? 1  Preparing Food and eating ? N  Using the Toilet? N  In the past six months, have you accidently leaked urine? N  Do you have problems with loss of bowel control? N  Managing your Medications? N  Managing your Finances? N  Housekeeping or managing your Housekeeping? N    Patient Care Team: Jennelle Riis, MD as PCP - General (Family Medicine) Okey Vina GAILS, MD as PCP - Cardiology (Cardiology) Gayland Lauraine PARAS, NP as Nurse Practitioner (Neurology) Jerrye Doffing, MD as Referring Physician (Urology) Slusher, Santana LABOR, RN as Registered Nurse [place]  I have updated your Care Teams any recent Medical Services you may have received from other providers in the past year.     Assessment:   This is a routine wellness examination for Jose Li.  Hearing/Vision screen Hearing Screening - Comments:: Denies hearing difficulties   Vision Screening - Comments:: No vision problems; will schedule routine eye exam soon     Goals Addressed             This Visit's Progress    Maintain health and independence   On track      Depression Screen     05/07/2024    2:01 PM 04/06/2024    3:31 PM 03/06/2024   10:38 AM 09/30/2023   10:08 AM 05/18/2023   11:43 AM 05/03/2023    2:05 PM 05/03/2023    2:04 PM  PHQ 2/9 Scores  PHQ - 2 Score 3 3 1 2 2 1 1   PHQ- 9 Score 12 12  7 7 7      Fall Risk     04/06/2024    3:28 PM 03/06/2024   11:53 AM 07/15/2023   12:01 PM 05/03/2023    2:05 PM 01/24/2023    2:39 PM  Fall Risk   Falls in the past year? 0 1 1 1 1   Number falls in past yr: 0 0 0 1 1  Injury with Fall? 0 0 0 1 0  Risk for fall due to :   Other (Comment) Impaired balance/gait History of fall(s)  Risk for fall due to: Comment   seizure    Follow up  Falls prevention discussed;Education provided Falls evaluation completed Falls evaluation completed  Falls prevention discussed    MEDICARE RISK AT HOME:  Medicare Risk at Home Any stairs in or around the home?: No If so, are there any without handrails?: No Home free of loose throw rugs in walkways, pet beds, electrical cords, etc?: Yes Adequate lighting in your home to reduce risk of falls?: Yes Life alert?: No Use of a cane, walker or w/c?: No Grab bars in the bathroom?: Yes Shower chair or bench in shower?: No Elevated toilet seat or a handicapped toilet?: Yes  TIMED UP AND GO:  Was the test performed?  No  Cognitive Function: Declined/Normal: No cognitive concerns noted by patient or family. Patient alert, oriented, able to answer questions appropriately and recall recent events. No signs of memory loss or confusion.        05/03/2023    2:08 PM 01/20/2022   10:04 AM 04/17/2019    2:24 PM  6CIT Screen  What Year? 0 points 0 points 0 points  What month? 0 points 0 points 0 points  What time? 0 points 0 points 0 points  Count back from 20 0 points 0 points 0 points  Months in reverse 0 points 0 points 0 points  Repeat phrase 0 points 0 points 0 points  Total Score 0 points 0 points 0 points    Immunizations Immunization History  Administered Date(s) Administered   Influenza Inj Mdck Quad With Preservative 08/11/2021   Influenza Split 08/18/2011, 08/02/2012   Influenza Whole 09/06/2007, 08/21/2008, 08/22/2008   Influenza,inj,Quad PF,6+ Mos 09/21/2013, 09/13/2014, 07/07/2016, 09/02/2017, 01/23/2018, 07/27/2018, 07/13/2019, 09/09/2020   Janssen (J&J) SARS-COV-2 Vaccination 02/25/2020   Moderna SARS-COV2 Booster Vaccination 10/16/2020, 04/13/2021   Pfizer Covid-19 Vaccine Bivalent Booster 14yrs & up 04/07/2022   Pfizer(Comirnaty)Fall Seasonal Vaccine 12 years and older 02/18/2024   Pneumococcal Conjugate Pcv21, Polysaccharide Crm197 Conjugaf 03/12/2024   Pneumococcal Polysaccharide-23 12/29/2015   Pneumococcal-Unspecified 03/12/2024   Td 11/15/2005   Tdap 01/28/2016     Screening Tests Health Maintenance  Topic Date Due   Zoster Vaccines- Shingrix (1 of 2) Never done   Lung Cancer Screening  08/11/2023   INFLUENZA VACCINE  06/15/2024   COVID-19 Vaccine (4 - 2024-25 season) 08/19/2024   Pneumococcal Vaccine: 50+ Years (2 of 2 - PCV) 03/12/2025   Medicare Annual Wellness (AWV)  05/07/2025   DTaP/Tdap/Td (3 - Td or Tdap) 01/27/2026   Colonoscopy  12/22/2032   Hepatitis C Screening  Completed   HPV VACCINES  Aged Out   Meningococcal B Vaccine  Aged Out    Health Maintenance  Health Maintenance Due  Topic Date Due   Zoster Vaccines- Shingrix (1 of 2) Never done   Lung Cancer Screening  08/11/2023   Health Maintenance Items Addressed: Declines lung cancer screening at this time   Additional Screening:  Vision Screening: Recommended annual ophthalmology exams for early detection of glaucoma and other disorders of the eye. Would you like a referral to an eye doctor? No    Dental Screening: Recommended annual dental exams for proper oral hygiene  Community Resource Referral / Chronic Care Management: CRR required this visit?  No   CCM required this visit?  No   Plan:    I have personally reviewed and noted the following in the patient's chart:   Medical and social history Use of alcohol, tobacco or illicit drugs  Current medications and supplements including opioid prescriptions. Patient is not currently taking opioid prescriptions. Functional ability and status Nutritional status Physical activity Advanced directives List of other physicians Hospitalizations, surgeries, and ER visits in previous 12 months Vitals Screenings to include cognitive, depression, and falls Referrals and appointments  In addition, I have reviewed and discussed with patient certain preventive protocols, quality metrics, and best practice recommendations. A written personalized care plan for preventive services as well as general preventive health  recommendations were provided to patient.   Jose Li [place], [place]   [id number]/[birth year]/7974   After Visit Summary: (MyChart) Due to this being a telephonic visit, the after visit summary with patients personalized plan was offered to patient via MyChart   Notes: Nothing significant to report at this time.

## 2024-05-07 NOTE — Patient Instructions (Signed)
 Mr. Courington , Thank you for taking time out of your busy schedule to complete your Annual Wellness Visit with me. I enjoyed our conversation and look forward to speaking with you again next year. I, as well as your care team,  appreciate your ongoing commitment to your health goals. Please review the following plan we discussed and let me know if I can assist you in the future. Your Game plan/ To Do List    Follow up Visits: Next Medicare AWV with our clinical staff: In 1 year    Have you seen your provider in the last 6 months (3 months if uncontrolled diabetes)? Yes Next Office Visit with your provider: To be scheduled  Clinician Recommendations:  Aim for 30 minutes of exercise or brisk walking, 6-8 glasses of water, and 5 servings of fruits and vegetables each day.       This is a list of the screening recommended for you and due dates:  Health Maintenance  Topic Date Due   Zoster (Shingles) Vaccine (1 of 2) Never done   Screening for Lung Cancer  08/11/2023   Flu Shot  06/15/2024   COVID-19 Vaccine (4 - 2024-25 season) 08/19/2024   Pneumococcal Vaccine for age over 94 (2 of 2 - PCV) 03/12/2025   Medicare Annual Wellness Visit  05/07/2025   DTaP/Tdap/Td vaccine (3 - Td or Tdap) 01/27/2026   Colon Cancer Screening  12/22/2032   Hepatitis C Screening  Completed   HPV Vaccine  Aged Out   Meningitis B Vaccine  Aged Out    Advanced directives: (In Chart) A copy of your advanced directives are scanned into your chart should your provider ever need it.  Advance Care Planning is important because it:  [x]  Makes sure you receive the medical care that is consistent with your values, goals, and preferences  [x]  It provides guidance to your family and loved ones and reduces their decisional burden about whether or not they are making the right decisions based on your wishes.  Follow the link provided in your after visit summary or read over the paperwork we have mailed to you to help you  started getting your Advance Directives in place. If you need assistance in completing these, please reach out to us  so that we can help you!  See attachments for Preventive Care and Fall Prevention Tips.

## 2024-05-21 ENCOUNTER — Other Ambulatory Visit: Payer: Self-pay

## 2024-05-22 ENCOUNTER — Telehealth: Payer: Self-pay

## 2024-05-22 NOTE — Patient Instructions (Signed)
 Visit Information  Thank you for taking time to visit with me today. Please don't hesitate to contact me if I can be of assistance to you before our next scheduled appointment.  Your next care management appointment is by telephone on Wednesday, August 6th at 10:00am.    Please call the care guide team at [phone] if you need to cancel, schedule, or reschedule an appointment.   A reminder to ALL patients/family/friends, please call the [place]  National Suicide Prevention Lifeline: 662-198-6151 or TTY: (812) 511-4690 TTY [phone]) to talk to a trained counselor if you are experiencing a Mental Health or Behavioral Health Crisis or need someone to talk to.  Santana Stamp BSN, CCM [place]  VBCI Population Health RN Care Manager Direct Dial: [phone]  Fax: [phone]

## 2024-05-22 NOTE — Patient Outreach (Signed)
 Complex Care Management   Visit Note  05/22/2024  Name:  Jose Li MRN: [id number] DOB: September 29, 1956  Situation: Referral received for Complex Care Management related to BPH I obtained verbal consent from Patient.  Visit completed with Jose Li   on the phone. Main concern today: having diarrhea for the last couple of days, states he can manage with Kaopectate, using sparingly.  Denies nausea/vomiting.    Background:   Past Medical History:  Diagnosis Date   Acute kidney injury (HCC) 05/26/2018   Allergy    Anemia    Anxiety    Arthritis    LEGS,BACK   Cataract    BILATERAL   CKD (chronic kidney disease), stage III (HCC)    Depression    History of kidney stones    Hyperlipidemia    Hypertension, essential, benign 08/02/2012   Orthostatic hypotension 10/09/2013   OSA (obstructive sleep apnea) 10/01/2015   Severe OSA with an AHI of 86/hr now on BiPAP at 23/19cm H2O   Pneumonia    Rhabdomyolysis 08/24/2022   Seizures (HCC)    none for last 6-45months   Sleep apnea    Substance abuse (HCC)     Assessment: Patient Reported Symptoms:  Cognitive Cognitive Status: Alert and oriented to person, place, and time, Insightful and able to interpret abstract concepts, Normal speech and language skills      Neurological Neurological Review of Symptoms: Dizziness (Occasional dizziness with position changes, he is aware to slow down his movements to avoid falls.) Neurological Management Strategies: Medication therapy Neurological Self-Management Outcome: 4 (good)  HEENT HEENT Symptoms Reported: No symptoms reported      Cardiovascular Cardiovascular Symptoms Reported: No symptoms reported    Respiratory Respiratory Symptoms Reported: No symptoms reported    Endocrine Endocrine Symptoms Reported: Not assessed    Gastrointestinal Gastrointestinal Symptoms Reported: Diarrhea Additional Gastrointestinal Details: Diarrhea started a couple of days ago MD aware, states MD told him to  take Kaopectate as needed.  Will drink more fluids to stay hydrated, and follow BRAT diet for a couple of day.      Genitourinary Genitourinary Symptoms Reported: Pain/burning with urination Additional Genitourinary Details: Reports occasional pain with urination, this has greatly improved.    Integumentary Integumentary Symptoms Reported: No symptoms reported    Musculoskeletal Musculoskelatal Symptoms Reviewed: Unsteady gait Additional Musculoskeletal Details: Will use cane.   Falls in the past year?: No    Psychosocial Psychosocial Symptoms Reported: Not assessed            05/07/2024    2:01 PM  Depression screen PHQ 2/9  Decreased Interest 2  Down, Depressed, Hopeless 1  PHQ - 2 Score 3  Altered sleeping 2  Tired, decreased energy 2  Change in appetite 3  Feeling bad or failure about yourself  0  Trouble concentrating 0  Moving slowly or fidgety/restless 2  Suicidal thoughts 0  PHQ-9 Score 12    There were no vitals filed for this visit.  Medications Reviewed Today   Medications were not reviewed in this encounter     Recommendation:   Specialty provider follow-up : Cardiology 07/20/24, Neurology 07/31/24 -Jose Li will report worsening diarrhea to PCP - states he will take Kaopectate sparingly as needed, will drink more fluids to stay hydrated, and follow BRAT diet if he feels any nausea.  -States he is taking Vimpat  due to breakthrough seizures - he has had NO seizures since prescribed, he will call Neurology for any seizure activity.  Follow Up Plan:   Telephone follow-up in 1 month  Santana Stamp BSN, CCM [place]  VBCI Population Health RN Care Manager Direct Dial: [phone]  Fax: [phone]

## 2024-06-20 ENCOUNTER — Telehealth: Payer: Self-pay

## 2024-07-16 NOTE — Progress Notes (Unsigned)
 Cardiology Office Note   Date:  07/16/2024   ID:  Jose Li, DOB Feb 25, 1956, MRN [id number]  PCP:  Diona Perkins, MD  Cardiologist:   Vina Gull, MD   F/U of dizziness, orthostatic intolerance    History of Present Illness: Jose Li is a 68 y.o. male with a history of dizziness, syncope, autonomic dysfunction, mild CAD (on CT scan), bipolar disorder, OSA (on BiPAP),, hypokalemia, seizures due to EtOH withdrawal., HL, tobacco use, EtOH abuse, HTN, BPH and hypokalemia The pt was hospitalized 05/2022-06/2022  for L sided subdural hematoma, s/p craniotomy   Complicated by delirium   Discharged to SNF.   The pt was seen in ER on 08/24/22 with weakness, falls, CK elevation.  Received IV fluids   Discharged to a SNF. Note the pt has had problems with dizziness when psych meds have  been changed   I last saw the pt in May 2024 Since I saw him in clinic he says he gets dizzy occasionally  Denies syncope Also notes occasional palpitations     Denies CP  No SOB     Still smoking 1 ppd    I saw the pt in Dec 2024.  Since he was last seen, he says he is overall doing fairly well.  He had 2 syncopal spells in the interval, both related to quick standing.  He is taking his time.  He is followed by [facility].  Midodrine  dose is now 20 mg early a.m., 10 mg at 11 AM and 10 mg at 2 PM. He denies chest pain.  No palpitations.    Allergies:   Cymbalta  [duloxetine  hcl], Erythromycin, Hctz [hydrochlorothiazide ], and Inderal  [propranolol ]   Past Medical History:  Diagnosis Date   Acute kidney injury (HCC) 05/26/2018   Allergy    Anemia    Anxiety    Arthritis    LEGS,BACK   Cataract    BILATERAL   CKD (chronic kidney disease), stage III (HCC)    Depression    History of kidney stones    Hyperlipidemia    Hypertension, essential, benign 08/02/2012   Orthostatic hypotension 10/09/2013   OSA (obstructive sleep apnea) 10/01/2015   Severe OSA with an AHI of 86/hr now on BiPAP at 23/19cm H2O    Pneumonia    Rhabdomyolysis 08/24/2022   Seizures (HCC)    none for last 6-68months   Sleep apnea    Substance abuse (HCC)     Past Surgical History:  Procedure Laterality Date   COLONOSCOPY     CRANIOTOMY Left 06/08/2022   Procedure: CRANIOTOMY HEMATOMA EVACUATION SUBDURAL;  Surgeon: Louis Shove, MD;  Location: [facility];  Service: Neurosurgery;  Laterality: Left;   CYSTOSCOPY WITH RETROGRADE PYELOGRAM, URETEROSCOPY AND STENT PLACEMENT Right 03/29/2023   Procedure: CYSTOSCOPY WITH RETROGRADE PYELOGRAM, URETEROSCOPY AND STENT PLACEMENT;  Surgeon: Shona Layman BROCKS, MD;  Location: [facility];  Service: Urology;  Laterality: Right;   EXTRACORPOREAL SHOCK WAVE LITHOTRIPSY Right 03/28/2023   Procedure: EXTRACORPOREAL SHOCK WAVE LITHOTRIPSY (ESWL);  Surgeon: Shona Layman BROCKS, MD;  Location: [facility];  Service: Urology;  Laterality: Right;  Local anesthesia   FRACTURE SURGERY  1990   right hip and femur   HERNIA REPAIR  07/2009   umbilical hernia/ got infected   JOINT REPLACEMENT Right 1990   right hip   TRACHEOSTOMY  08/1999   Had pneumoniaand was in a coma 10 days   WISDOM TOOTH EXTRACTION       Social History:  The patient  reports that he has been smoking cigarettes. He has a 30 pack-year smoking history. He has been exposed to tobacco smoke. He has never used smokeless tobacco. He reports that he does not currently use alcohol. He reports that he does not currently use drugs.   Family History:  The patient's family history includes Diabetes in his father; Heart disease in his brother and mother.    ROS:  Please see the history of present illness. All other systems are reviewed and  Negative to the above problem except as noted.   BP 120/70  P 100  Sat 98% GEN: Pt is a 68 yo in NAD  HEENT: normal  Neck:   JVP is not elevated  No bruits  Cardiac: RRR; no murmurs.    Respiratory:  clear to auscultation  GI  Soft  Nontender   No hepatomegaly  Ext  No LE edema  2+ Post  tibial pulse   EKG:  EKG shows NSR     Lipid Panel    Component Value Date/Time   CHOL 134 12/13/2022 1246   TRIG 146 12/13/2022 1246   HDL 36 (L) 12/13/2022 1246   CHOLHDL 3.7 12/13/2022 1246   CHOLHDL 5.3 (H) 12/29/2015 1146   VLDL 79 (H) 12/29/2015 1146   LDLCALC 72 12/13/2022 1246      Wt Readings from Last 3 Encounters:  05/07/24 170 lb (77.1 kg)  04/06/24 170 lb 3.2 oz (77.2 kg)  02/01/24 175 lb (79.4 kg)      ASSESSMENT AND PLAN: 1  Autonomic dysfunction  Patient currently is on midodrine  20/10/10.  Tolerating.  Blood pressure is good. Encouraged him to stay hydrated, urine should be pale.  Avoid quick standing, quick changes in position  2  CAD   Mild plaquing on CT scan.  Patient asymptomatic  3 HL last lipid panel in March, LDL was 58; HDL 35; triglycerides 174.  I have asked that he cut back on simple carbs, sugars.  His A1c was elevated at 5.9. Continue statin  4  Hypokalemia.  Last potassium in March was 4.3.  5.  Tob abuse   Counselled .    6  Hx seizures.   Followed in neuro     7 Hx bipolar disorder   Need to watch BP with changes in meds   Follow up in Jun 2026 Current medicines are reviewed at length with the patient today.  The patient does not have concerns regarding medicines.  Signed, Vina Gull, MD  07/16/2024 10:20 PM    [facility] [address] [place], [place], [place]  [address] Phone: [phone]; Fax: [phone]

## 2024-07-20 ENCOUNTER — Ambulatory Visit: Attending: Internal Medicine | Admitting: Internal Medicine

## 2024-07-20 ENCOUNTER — Encounter: Payer: Self-pay | Admitting: Internal Medicine

## 2024-07-20 VITALS — BP 120/70 | HR 100 | Ht 71.0 in | Wt 172.7 lb

## 2024-07-20 DIAGNOSIS — R55 Syncope and collapse: Secondary | ICD-10-CM

## 2024-07-20 NOTE — Patient Instructions (Signed)
 Medication Instructions:  Your physician recommends that you continue on your current medications as directed. Please refer to the Current Medication list given to you today.  *If you need a refill on your cardiac medications before your next appointment, please call your pharmacy*  Follow-Up: At [facility], you and your health needs are our priority.  As part of our continuing mission to provide you with exceptional heart care, our providers are all part of one team.  This team includes your primary Cardiologist (physician) and Advanced Practice Providers or APPs (Physician Assistants and Nurse Practitioners) who all work together to provide you with the care you need, when you need it.  Your next appointment:   9 month(s)  Provider:   Vina Gull, MD   We recommend signing up for the patient portal called MyChart.  Sign up information is provided on this After Visit Summary.  MyChart is used to connect with patients for Virtual Visits (Telemedicine).  Patients are able to view lab/test results, encounter notes, upcoming appointments, etc.  Non-urgent messages can be sent to your provider as well.    To learn more about what you can do with MyChart, go to [email or web address].

## 2024-07-29 ENCOUNTER — Other Ambulatory Visit: Payer: Self-pay | Admitting: Neurology

## 2024-07-30 ENCOUNTER — Other Ambulatory Visit: Payer: Self-pay

## 2024-07-30 ENCOUNTER — Encounter (HOSPITAL_COMMUNITY): Payer: Self-pay

## 2024-07-30 ENCOUNTER — Observation Stay (HOSPITAL_COMMUNITY)
Admission: EM | Admit: 2024-07-30 | Discharge: 2024-08-01 | Disposition: A | Discharge status: Home / Self Care | Attending: Internal Medicine | Admitting: Internal Medicine

## 2024-07-30 DIAGNOSIS — R197 Diarrhea, unspecified: Secondary | ICD-10-CM | POA: Insufficient documentation

## 2024-07-30 DIAGNOSIS — N183 Chronic kidney disease, stage 3 unspecified: Secondary | ICD-10-CM | POA: Diagnosis not present

## 2024-07-30 DIAGNOSIS — N400 Enlarged prostate without lower urinary tract symptoms: Secondary | ICD-10-CM | POA: Diagnosis not present

## 2024-07-30 DIAGNOSIS — R2689 Other abnormalities of gait and mobility: Secondary | ICD-10-CM | POA: Insufficient documentation

## 2024-07-30 DIAGNOSIS — Z8669 Personal history of other diseases of the nervous system and sense organs: Secondary | ICD-10-CM | POA: Diagnosis not present

## 2024-07-30 DIAGNOSIS — F319 Bipolar disorder, unspecified: Secondary | ICD-10-CM | POA: Insufficient documentation

## 2024-07-30 DIAGNOSIS — F17210 Nicotine dependence, cigarettes, uncomplicated: Secondary | ICD-10-CM | POA: Diagnosis not present

## 2024-07-30 DIAGNOSIS — R1110 Vomiting, unspecified: Secondary | ICD-10-CM | POA: Diagnosis not present

## 2024-07-30 DIAGNOSIS — E860 Dehydration: Secondary | ICD-10-CM | POA: Insufficient documentation

## 2024-07-30 DIAGNOSIS — Z7982 Long term (current) use of aspirin: Secondary | ICD-10-CM | POA: Diagnosis not present

## 2024-07-30 DIAGNOSIS — R2681 Unsteadiness on feet: Secondary | ICD-10-CM | POA: Insufficient documentation

## 2024-07-30 DIAGNOSIS — G909 Disorder of the autonomic nervous system, unspecified: Secondary | ICD-10-CM | POA: Insufficient documentation

## 2024-07-30 DIAGNOSIS — R112 Nausea with vomiting, unspecified: Principal | ICD-10-CM | POA: Insufficient documentation

## 2024-07-30 DIAGNOSIS — Z79899 Other long term (current) drug therapy: Secondary | ICD-10-CM | POA: Diagnosis not present

## 2024-07-30 DIAGNOSIS — I129 Hypertensive chronic kidney disease with stage 1 through stage 4 chronic kidney disease, or unspecified chronic kidney disease: Secondary | ICD-10-CM | POA: Insufficient documentation

## 2024-07-30 DIAGNOSIS — E876 Hypokalemia: Secondary | ICD-10-CM | POA: Diagnosis not present

## 2024-07-30 LAB — CBC WITH DIFFERENTIAL/PLATELET
Abs Immature Granulocytes: 0.05 K/uL (ref 0.00–0.07)
Basophils Absolute: 0 K/uL (ref 0.0–0.1)
Basophils Relative: 0 %
Eosinophils Absolute: 0 K/uL (ref 0.0–0.5)
Eosinophils Relative: 0 %
HCT: 36.6 % — ABNORMAL LOW (ref 39.0–52.0)
Hemoglobin: 13 g/dL (ref 13.0–17.0)
Immature Granulocytes: 0 %
Lymphocytes Relative: 7 %
Lymphs Abs: 0.9 K/uL (ref 0.7–4.0)
MCH: 30.2 pg (ref 26.0–34.0)
MCHC: 35.5 g/dL (ref 30.0–36.0)
MCV: 84.9 fL (ref 80.0–100.0)
Monocytes Absolute: 1 K/uL (ref 0.1–1.0)
Monocytes Relative: 7 %
Neutro Abs: 12.3 K/uL — ABNORMAL HIGH (ref 1.7–7.7)
Neutrophils Relative %: 86 %
Platelets: 429 K/uL — ABNORMAL HIGH (ref 150–400)
RBC: 4.31 MIL/uL (ref 4.22–5.81)
RDW: 14 % (ref 11.5–15.5)
WBC: 14.3 K/uL — ABNORMAL HIGH (ref 4.0–10.5)
nRBC: 0 % (ref 0.0–0.2)

## 2024-07-30 LAB — BASIC METABOLIC PANEL WITH GFR
Anion gap: 11 (ref 5–15)
BUN: 6 mg/dL — ABNORMAL LOW (ref 8–23)
CO2: 23 mmol/L (ref 22–32)
Calcium: 8.6 mg/dL — ABNORMAL LOW (ref 8.9–10.3)
Chloride: 106 mmol/L (ref 98–111)
Creatinine, Ser: 0.89 mg/dL (ref 0.61–1.24)
GFR, Estimated: >60 mL/min (ref >60)
Glucose, Bld: 107 mg/dL — ABNORMAL HIGH (ref 70–99)
Potassium: 2.7 mmol/L — CL (ref 3.5–5.1)
Sodium: 141 mmol/L (ref 135–145)

## 2024-07-30 LAB — COMPREHENSIVE METABOLIC PANEL WITH GFR
ALT: 15 U/L (ref 0–44)
AST: 33 U/L (ref 15–41)
Albumin: 4.2 g/dL (ref 3.5–5.0)
Alkaline Phosphatase: 95 U/L (ref 38–126)
Anion gap: 17 — ABNORMAL HIGH (ref 5–15)
BUN: 5 mg/dL — ABNORMAL LOW (ref 8–23)
CO2: 21 mmol/L — ABNORMAL LOW (ref 22–32)
Calcium: 9.6 mg/dL (ref 8.9–10.3)
Chloride: 97 mmol/L — ABNORMAL LOW (ref 98–111)
Creatinine, Ser: 1.02 mg/dL (ref 0.61–1.24)
GFR, Estimated: >60 mL/min (ref >60)
Glucose, Bld: 136 mg/dL — ABNORMAL HIGH (ref 70–99)
Potassium: 2.2 mmol/L — CL (ref 3.5–5.1)
Sodium: 135 mmol/L (ref 135–145)
Total Bilirubin: 0.3 mg/dL (ref 0.0–1.2)
Total Protein: 7 g/dL (ref 6.5–8.1)

## 2024-07-30 LAB — C DIFFICILE QUICK SCREEN W PCR REFLEX
C Diff antigen: NEGATIVE
C Diff interpretation: NOT DETECTED
C Diff toxin: NEGATIVE

## 2024-07-30 LAB — MAGNESIUM: Magnesium: 1.8 mg/dL (ref 1.7–2.4)

## 2024-07-30 LAB — LIPASE, BLOOD: Lipase: 26 U/L (ref 11–51)

## 2024-07-30 LAB — ETHANOL: Alcohol, Ethyl (B): <15 mg/dL (ref <15)

## 2024-07-30 MED ORDER — BENZTROPINE MESYLATE 1 MG PO TABS
1.0000 mg | ORAL_TABLET | Freq: Every day | ORAL | Status: DC
  Administered 2024-07-30 – 2024-07-31 (×2): 1 mg via ORAL
  Filled 2024-07-30 (×2): qty 1

## 2024-07-30 MED ORDER — POTASSIUM CHLORIDE 10 MEQ/100ML IV SOLN
10.0000 meq | INTRAVENOUS | Status: AC
  Administered 2024-07-30 (×4): 10 meq via INTRAVENOUS
  Filled 2024-07-30 (×4): qty 100

## 2024-07-30 MED ORDER — RISPERIDONE 1 MG PO TABS
2.0000 mg | ORAL_TABLET | Freq: Every day | ORAL | Status: DC
  Administered 2024-07-30 – 2024-07-31 (×2): 2 mg via ORAL
  Filled 2024-07-30 (×2): qty 2

## 2024-07-30 MED ORDER — SODIUM CHLORIDE 0.9 % IV BOLUS (SEPSIS)
1000.0000 mL | Freq: Once | INTRAVENOUS | Status: AC
  Administered 2024-07-30: 1000 mL via INTRAVENOUS

## 2024-07-30 MED ORDER — LACOSAMIDE 50 MG PO TABS
150.0000 mg | ORAL_TABLET | Freq: Two times a day (BID) | ORAL | Status: DC
  Administered 2024-07-30 – 2024-08-01 (×4): 150 mg via ORAL
  Filled 2024-07-30 (×4): qty 3

## 2024-07-30 MED ORDER — ONDANSETRON HCL 4 MG/2ML IJ SOLN: 4.0000 mg | Freq: Four times a day (QID) | INTRAMUSCULAR | Status: DC | PRN

## 2024-07-30 MED ORDER — LORAZEPAM 2 MG/ML IJ SOLN: 1.0000 mg | INTRAMUSCULAR | Status: DC | PRN

## 2024-07-30 MED ORDER — LORAZEPAM 1 MG PO TABS: 1.0000 mg | ORAL_TABLET | ORAL | Status: DC | PRN

## 2024-07-30 MED ORDER — POTASSIUM CHLORIDE 10 MEQ/100ML IV SOLN
10.0000 meq | INTRAVENOUS | Status: AC
  Administered 2024-07-30 (×2): 10 meq via INTRAVENOUS
  Filled 2024-07-30 (×2): qty 100

## 2024-07-30 MED ORDER — ALBUTEROL SULFATE (2.5 MG/3ML) 0.083% IN NEBU: 2.5000 mg | INHALATION_SOLUTION | RESPIRATORY_TRACT | Status: DC | PRN

## 2024-07-30 MED ORDER — TRAZODONE HCL 100 MG PO TABS
300.0000 mg | ORAL_TABLET | Freq: Every day | ORAL | Status: DC
  Administered 2024-07-30 – 2024-07-31 (×2): 300 mg via ORAL
  Filled 2024-07-30 (×2): qty 3

## 2024-07-30 MED ORDER — TRAZODONE HCL 50 MG PO TABS: 25.0000 mg | ORAL_TABLET | Freq: Every evening | ORAL | Status: DC | PRN

## 2024-07-30 MED ORDER — ONDANSETRON HCL 4 MG PO TABS
4.0000 mg | ORAL_TABLET | Freq: Four times a day (QID) | ORAL | Status: DC | PRN
  Administered 2024-07-30: 4 mg via ORAL
  Filled 2024-07-30: qty 1

## 2024-07-30 MED ORDER — ENOXAPARIN SODIUM 40 MG/0.4ML IJ SOSY
40.0000 mg | PREFILLED_SYRINGE | INTRAMUSCULAR | Status: DC
  Administered 2024-07-30 – 2024-07-31 (×2): 40 mg via SUBCUTANEOUS
  Filled 2024-07-30 (×2): qty 0.4

## 2024-07-30 MED ORDER — ACETAMINOPHEN 650 MG RE SUPP: 650.0000 mg | Freq: Four times a day (QID) | RECTAL | Status: DC | PRN

## 2024-07-30 MED ORDER — ATORVASTATIN CALCIUM 40 MG PO TABS
40.0000 mg | ORAL_TABLET | Freq: Every day | ORAL | Status: DC
  Administered 2024-07-30 – 2024-08-01 (×3): 40 mg via ORAL
  Filled 2024-07-30 (×3): qty 1

## 2024-07-30 MED ORDER — THIAMINE MONONITRATE 100 MG PO TABS
100.0000 mg | ORAL_TABLET | Freq: Every day | ORAL | Status: DC
  Administered 2024-07-31 – 2024-08-01 (×2): 100 mg via ORAL
  Filled 2024-07-30 (×3): qty 1

## 2024-07-30 MED ORDER — THIAMINE HCL 100 MG/ML IJ SOLN
100.0000 mg | Freq: Every day | INTRAMUSCULAR | Status: DC
  Administered 2024-07-30: 100 mg via INTRAVENOUS
  Filled 2024-07-30 (×2): qty 2

## 2024-07-30 MED ORDER — TAMSULOSIN HCL 0.4 MG PO CAPS
0.4000 mg | ORAL_CAPSULE | Freq: Every day | ORAL | Status: DC
  Administered 2024-07-30 – 2024-07-31 (×2): 0.4 mg via ORAL
  Filled 2024-07-30 (×2): qty 1

## 2024-07-30 MED ORDER — LORATADINE 10 MG PO TABS
10.0000 mg | ORAL_TABLET | Freq: Every day | ORAL | Status: DC
  Administered 2024-07-30 – 2024-08-01 (×3): 10 mg via ORAL
  Filled 2024-07-30 (×3): qty 1

## 2024-07-30 MED ORDER — PREGABALIN 75 MG PO CAPS
75.0000 mg | ORAL_CAPSULE | Freq: Two times a day (BID) | ORAL | Status: DC
  Administered 2024-07-30 – 2024-08-01 (×4): 75 mg via ORAL
  Filled 2024-07-30 (×4): qty 1

## 2024-07-30 MED ORDER — MIDODRINE HCL 5 MG PO TABS
10.0000 mg | ORAL_TABLET | Freq: Three times a day (TID) | ORAL | Status: DC
  Administered 2024-07-30 – 2024-08-01 (×6): 10 mg via ORAL
  Filled 2024-07-30 (×8): qty 2

## 2024-07-30 MED ORDER — POTASSIUM CHLORIDE CRYS ER 20 MEQ PO TBCR
40.0000 meq | EXTENDED_RELEASE_TABLET | Freq: Once | ORAL | Status: AC
  Administered 2024-07-30: 40 meq via ORAL
  Filled 2024-07-30: qty 2

## 2024-07-30 MED ORDER — FOLIC ACID 1 MG PO TABS
1.0000 mg | ORAL_TABLET | Freq: Every day | ORAL | Status: DC
  Administered 2024-07-30 – 2024-08-01 (×3): 1 mg via ORAL
  Filled 2024-07-30 (×3): qty 1

## 2024-07-30 MED ORDER — CITALOPRAM HYDROBROMIDE 20 MG PO TABS
20.0000 mg | ORAL_TABLET | Freq: Every day | ORAL | Status: DC
  Administered 2024-07-30 – 2024-08-01 (×3): 20 mg via ORAL
  Filled 2024-07-30 (×3): qty 1

## 2024-07-30 MED ORDER — LEVETIRACETAM 500 MG PO TABS
1500.0000 mg | ORAL_TABLET | Freq: Two times a day (BID) | ORAL | Status: DC
  Administered 2024-07-30 – 2024-08-01 (×4): 1500 mg via ORAL
  Filled 2024-07-30 (×4): qty 3

## 2024-07-30 MED ORDER — FINASTERIDE 5 MG PO TABS
5.0000 mg | ORAL_TABLET | Freq: Every day | ORAL | Status: DC
  Administered 2024-07-30 – 2024-08-01 (×3): 5 mg via ORAL
  Filled 2024-07-30 (×3): qty 1

## 2024-07-30 MED ORDER — ASPIRIN 81 MG PO TBEC
81.0000 mg | DELAYED_RELEASE_TABLET | Freq: Every day | ORAL | Status: DC
  Administered 2024-07-30 – 2024-08-01 (×3): 81 mg via ORAL
  Filled 2024-07-30 (×3): qty 1

## 2024-07-30 MED ORDER — ADULT MULTIVITAMIN W/MINERALS CH
1.0000 | ORAL_TABLET | Freq: Every day | ORAL | Status: DC
  Administered 2024-07-30 – 2024-08-01 (×3): 1 via ORAL
  Filled 2024-07-30 (×3): qty 1

## 2024-07-30 MED ORDER — BUSPIRONE HCL 5 MG PO TABS
5.0000 mg | ORAL_TABLET | Freq: Two times a day (BID) | ORAL | Status: DC
  Administered 2024-07-30 – 2024-08-01 (×4): 5 mg via ORAL
  Filled 2024-07-30 (×4): qty 1

## 2024-07-30 MED ORDER — ONDANSETRON HCL 4 MG/2ML IJ SOLN
4.0000 mg | Freq: Once | INTRAMUSCULAR | Status: AC
  Administered 2024-07-30: 4 mg via INTRAVENOUS
  Filled 2024-07-30: qty 2

## 2024-07-30 MED ORDER — ACETAMINOPHEN 325 MG PO TABS
650.0000 mg | ORAL_TABLET | Freq: Four times a day (QID) | ORAL | Status: DC | PRN
  Administered 2024-07-30 – 2024-08-01 (×6): 650 mg via ORAL
  Filled 2024-07-30 (×6): qty 2

## 2024-07-30 MED ORDER — LACTATED RINGERS IV SOLN: INTRAVENOUS | Status: AC

## 2024-07-30 NOTE — H&P (Signed)
 History and Physical  Jose Li FMW:[id number] DOB: 14-Jul-1956 DOA: 07/30/2024  PCP: Diona Perkins, MD   Chief Complaint: Vomiting, diarrhea  HPI: Jose Li is a 68 y.o. male with medical history significant for CKD stage III, hypertension, hyperlipidemia, orthostatic hypotension due to autonomic dysfunction, seizure disorder and history of subdural hematoma requiring craniotomy who is being admitted to the hospital with 3 days of subjective fever, nausea vomiting and diarrhea.  Patient states he lives on his own, has not been around anybody sick and has been having intractable vomiting and diarrhea for the last 3 days.  States he has never had this issue before.  States that he has been able to keep down his medications.  On evaluation in the ER, he is hemodynamically stable, has leukocytosis, hypokalemia.  Denies any recent medication changes, no recent antibiotics in the last 3 months.  Review of Systems: Please see HPI for pertinent positives and negatives. A complete 10 system review of systems are otherwise negative.  Past Medical History:  Diagnosis Date   Acute kidney injury (HCC) 05/26/2018   Allergy    Anemia    Anxiety    Arthritis    LEGS,BACK   Cataract    BILATERAL   CKD (chronic kidney disease), stage III (HCC)    Depression    History of kidney stones    Hyperlipidemia    Hypertension, essential, benign 08/02/2012   Orthostatic hypotension 10/09/2013   OSA (obstructive sleep apnea) 10/01/2015   Severe OSA with an AHI of 86/hr now on BiPAP at 23/19cm H2O   Pneumonia    Rhabdomyolysis 08/24/2022   Seizures (HCC)    none for last 6-45months   Sleep apnea    Substance abuse (HCC)    Past Surgical History:  Procedure Laterality Date   COLONOSCOPY     CRANIOTOMY Left 06/08/2022   Procedure: CRANIOTOMY HEMATOMA EVACUATION SUBDURAL;  Surgeon: Louis Shove, MD;  Location: [facility];  Service: Neurosurgery;  Laterality: Left;   CYSTOSCOPY WITH RETROGRADE PYELOGRAM,  URETEROSCOPY AND STENT PLACEMENT Right 03/29/2023   Procedure: CYSTOSCOPY WITH RETROGRADE PYELOGRAM, URETEROSCOPY AND STENT PLACEMENT;  Surgeon: Shona Layman BROCKS, MD;  Location: [facility];  Service: Urology;  Laterality: Right;   EXTRACORPOREAL SHOCK WAVE LITHOTRIPSY Right 03/28/2023   Procedure: EXTRACORPOREAL SHOCK WAVE LITHOTRIPSY (ESWL);  Surgeon: Shona Layman BROCKS, MD;  Location[facility];  Service: Urology;  Laterality: Right;  Local anesthesia   FRACTURE SURGERY  1990   right hip and femur   HERNIA REPAIR  07/2009   umbilical hernia/ got infected   JOINT REPLACEMENT Right 1990   right hip   TRACHEOSTOMY  08/1999   Had pneumoniaand was in a coma 10 days   WISDOM TOOTH EXTRACTION     Social History:  reports that he has been smoking cigarettes. He has a 30 pack-year smoking history. He has been exposed to tobacco smoke. He has never used smokeless tobacco. He reports that he does not currently use alcohol. He reports that he does not currently use drugs.  Allergies  Allergen Reactions   Cymbalta  [Duloxetine  Hcl] Other (See Comments)    Hyponatremia    Erythromycin Other (See Comments)    Unknown reation   Hctz [Hydrochlorothiazide ] Other (See Comments)    Syncope   Inderal  [Propranolol ] Other (See Comments)    Syncope     Family History  Problem Relation Age of Onset   Heart disease Mother    Diabetes Father  Heart disease Brother    Hypertension Neg Hx    Stroke Neg Hx    Colon cancer Neg Hx    Colon polyps Neg Hx    Crohn's disease Neg Hx    Esophageal cancer Neg Hx    Rectal cancer Neg Hx    Stomach cancer Neg Hx    Ulcerative colitis Neg Hx      Prior to Admission medications   Medication Sig Start Date End Date Taking? Authorizing Provider  acetaminophen  (TYLENOL ) 325 MG tablet Take 2 tablets (650 mg total) by mouth every 4 (four) hours as needed for mild pain (temp > 100.5). 06/29/22   Bergman, Meghan D, NP  aspirin  81 MG EC tablet Take 81 mg by  mouth daily.    [provider]  atorvastatin  (LIPITOR) 40 MG tablet TAKE 1 TABLET BY MOUTH DAILY 04/23/24   Okey Vina GAILS, MD  benztropine  (COGENTIN ) 1 MG tablet Take 1 mg by mouth at bedtime.    [provider]  Bismuth Subsalicylate (KAOPECTATE) 262 MG TABS Take 262 mg by mouth every 6 (six) hours as needed (Diarrhea).    [provider]  bismuth subsalicylate (PEPTO BISMOL) 262 MG/15ML suspension Take 30 mLs by mouth every 6 (six) hours as needed for diarrhea or loose stools.    [provider]  Boswellia-Glucosamine-Vit D (OSTEO BI-FLEX ONE PER DAY PO) Take 1 tablet by mouth daily.    [provider]  busPIRone  (BUSPAR ) 5 MG tablet Take 5 mg by mouth 2 (two) times daily.  07/06/17   [provider]  cetirizine  (ZYRTEC ) 10 MG tablet Take 10 mg by mouth daily.    [provider]  citalopram  (CELEXA ) 20 MG tablet Take 7 mg by mouth daily.    [provider]  Ensure (ENSURE) Take 237 mLs by mouth daily. Chocolate    [provider]  finasteride  (PROSCAR ) 5 MG tablet TAKE 1 TABLET BY MOUTH DAILY 01/31/24   Shona Layman BROCKS, MD  Lacosamide  (VIMPAT ) 150 MG TABS Take 1 tablet (150 mg total) by mouth in the morning and at bedtime. 02/01/24   Gayland Lauraine PARAS, NP  levETIRAcetam  (KEPPRA ) 750 MG tablet Take 2 tablets (1,500 mg total) by mouth 2 (two) times daily. 02/01/24   Gayland Lauraine PARAS, NP  midodrine  (PROAMATINE ) 10 MG tablet Take 1 tablet (10 mg total) by mouth 3 (three) times daily with meals. TAKE 2 TABLETS BY MOUTH DAILY AT 6AM, 1 TABLET AT 10 AM, THEN 1 TABLET AT 2PM 04/06/24   Jennelle Riis, MD  Multiple Vitamin (MULTIVITAMIN) tablet Take 1 tablet by mouth daily.    [provider]  potassium chloride  (KLOR-CON ) 10 MEQ tablet Take 10 mEq by mouth daily. 03/24/23   [provider]  pregabalin  (LYRICA ) 75 MG capsule TAKE 1 CAPSULE BY MOUTH TWICE  DAILY 03/19/24   Gayland Lauraine PARAS, NP  risperiDONE  (RISPERDAL ) 2 MG  tablet Take 2 mg by mouth at bedtime.     [provider]  tamsulosin  (FLOMAX ) 0.4 MG CAPS capsule Take 0.4 mg by mouth daily after supper. 08/20/20   [provider]  thiamine  250 MG tablet Take 250 mg by mouth daily.    [provider]  traZODone  (DESYREL ) 100 MG tablet Take 200 mg by mouth at bedtime. 09/24/15   [provider]    Physical Exam: BP (!) 166/72 (BP Location: Left Arm)   Pulse (!) 118   Temp 98.7 F (37.1 C) (Oral)  Resp 18   SpO2 97%  General:  Alert, oriented, calm, in no acute distress, looks slightly disheveled but not acutely ill Cardiovascular: RRR, no murmurs or rubs, no peripheral edema  Respiratory: clear to auscultation bilaterally, no wheezes, no crackles  Abdomen: soft, nontender, nondistended, normal bowel tones heard  Skin: dry, no rashes  Musculoskeletal: no joint effusions, normal range of motion  Psychiatric: appropriate affect, normal speech  Neurologic: extraocular muscles intact, clear speech, moving all extremities with intact sensorium         Labs on Admission:  Basic Metabolic Panel: Recent Labs  Lab 07/30/24 1025  NA 135  K 2.2*  CL 97*  CO2 21*  GLUCOSE 136*  BUN 5*  CREATININE 1.02  CALCIUM  9.6   Liver Function Tests: Recent Labs  Lab 07/30/24 1025  AST 33  ALT 15  ALKPHOS 95  BILITOT 0.3  PROT 7.0  ALBUMIN 4.2   Recent Labs  Lab 07/30/24 1025  LIPASE 26   No results for input(s): AMMONIA in the last 168 hours. CBC: Recent Labs  Lab 07/30/24 1025  WBC 14.3*  NEUTROABS 12.3*  HGB 13.0  HCT 36.6*  MCV 84.9  PLT 429*   Cardiac Enzymes: No results for input(s): CKTOTAL, CKMB, CKMBINDEX, TROPONINI in the last 168 hours. BNP (last 3 results) No results for input(s): BNP in the last 8760 hours.  ProBNP (last 3 results) No results for input(s): PROBNP in the last 8760 hours.  CBG: No results for input(s): GLUCAP in the last 168 hours.  Radiological Exams  on Admission: No results found. Assessment/Plan Jose Li is a 68 y.o. male with medical history significant for CKD stage III, hypertension, hyperlipidemia, orthostatic hypotension due to autonomic dysfunction, seizure disorder and history of subdural hematoma requiring craniotomy who is being admitted to the hospital with 3 days of subjective fever, nausea vomiting and diarrhea.   Nausea vomiting and diarrhea-likely viral process, no evidence of acute bacterial infection.  Abdomen is soft and nontender, nonfocal.  No indication for imaging or stool studies at this time. -Observation admission -Clear liquid diet -IV fluids -Supportive care with pain and nausea medication as needed  Hypokalemia-she does have chronic hypokalemia, takes potassium daily.  Presents with hypokalemia now due to GI losses. -Check magnesium  level -Replace potassium via p.o. and IV route -Recheck potassium level this afternoon  History of seizure disorder-continue Vimpat , Keppra   Bipolar disorder-continue BuSpar , Cogentin , Celexa   BPH-Proscar   Autonomic dysfunction-continue midodrine  3 times daily  CKD stage III-renal function appears to be at baseline  DVT prophylaxis: Lovenox      Code Status: Full Code  Consults called: None  Admission status: Observation  Time spent: 49 minutes  Akash Winski CHRISTELLA Gail MD [facility] Pager [phone]  If 7PM-7AM, please contact night-coverage [email or web address] Password [email or web address]  07/30/2024, 11:47 AM

## 2024-07-30 NOTE — Plan of Care (Signed)
   Problem: Clinical Measurements: Goal: Cardiovascular complication will be avoided Outcome: Progressing   Problem: Activity: Goal: Risk for activity intolerance will decrease Outcome: Progressing   Problem: Safety: Goal: Ability to remain free from injury will improve Outcome: Progressing

## 2024-07-30 NOTE — ED Provider Notes (Signed)
 [place] EMERGENCY DEPARTMENT AT [facility] Provider Note   CSN: [id number] Arrival date & time: 07/30/24  9047     Patient presents with: Emesis   Jose Li is a 68 y.o. male.   The history is provided by the patient.  Patient with extensive history including hypertension, orthostatic hypotension, alcohol use disorder, previous subdural hematoma requiring craniotomy, autonomic dysfunction presents with nausea vomiting diarrhea for the past 3 days with decreased oral intake He reports multiple episodes of both vomiting and diarrhea Patient also reports abdominal cramping. No fevers, no chest pain or shortness of breath.  Denies any blood in his vomit or stool Reports his only abdominal surgery was lithotripsy and ureteral stent He denies any recent alcohol use   Past Medical History:  Diagnosis Date   Acute kidney injury (HCC) 05/26/2018   Allergy    Anemia    Anxiety    Arthritis    LEGS,BACK   Cataract    BILATERAL   CKD (chronic kidney disease), stage III (HCC)    Depression    History of kidney stones    Hyperlipidemia    Hypertension, essential, benign 08/02/2012   Orthostatic hypotension 10/09/2013   OSA (obstructive sleep apnea) 10/01/2015   Severe OSA with an AHI of 86/hr now on BiPAP at 23/19cm H2O   Pneumonia    Rhabdomyolysis 08/24/2022   Seizures (HCC)    none for last 6-9months   Sleep apnea    Substance abuse (HCC)     Prior to Admission medications   Medication Sig Start Date End Date Taking? Authorizing Provider  acetaminophen  (TYLENOL ) 325 MG tablet Take 2 tablets (650 mg total) by mouth every 4 (four) hours as needed for mild pain (temp > 100.5). 06/29/22   Bergman, Meghan D, NP  aspirin  81 MG EC tablet Take 81 mg by mouth daily.    [provider]  atorvastatin  (LIPITOR) 40 MG tablet TAKE 1 TABLET BY MOUTH DAILY 04/23/24   Okey Vina GAILS, MD  benztropine  (COGENTIN ) 1 MG tablet Take 1 mg by mouth at bedtime.    [provider]  Bismuth Subsalicylate (KAOPECTATE) 262 MG TABS Take 262 mg by mouth every 6 (six) hours as needed (Diarrhea).    [provider]  bismuth subsalicylate (PEPTO BISMOL) 262 MG/15ML suspension Take 30 mLs by mouth every 6 (six) hours as needed for diarrhea or loose stools.    [provider]  Boswellia-Glucosamine-Vit D (OSTEO BI-FLEX ONE PER DAY PO) Take 1 tablet by mouth daily.    [provider]  busPIRone  (BUSPAR ) 5 MG tablet Take 5 mg by mouth 2 (two) times daily.  07/06/17   [provider]  cetirizine  (ZYRTEC ) 10 MG tablet Take 10 mg by mouth daily.    [provider]  citalopram  (CELEXA ) 20 MG tablet Take 7 mg by mouth daily.    [provider]  Ensure (ENSURE) Take 237 mLs by mouth daily. Chocolate    [provider]  finasteride  (PROSCAR ) 5 MG tablet TAKE 1 TABLET BY MOUTH DAILY 01/31/24   Shona Layman BROCKS, MD  Lacosamide  (VIMPAT ) 150 MG TABS Take 1 tablet (150 mg total) by mouth in the morning and at bedtime. 02/01/24   Gayland Lauraine PARAS, NP  levETIRAcetam  (KEPPRA ) 750 MG tablet Take 2 tablets (1,500 mg total) by mouth 2 (two) times daily. 02/01/24   Gayland Lauraine PARAS, NP  midodrine  (PROAMATINE ) 10 MG tablet Take 1 tablet (10 mg total) by  mouth 3 (three) times daily with meals. TAKE 2 TABLETS BY MOUTH DAILY AT 6AM, 1 TABLET AT 10 AM, THEN 1 TABLET AT 2PM 04/06/24   Jennelle Riis, MD  Multiple Vitamin (MULTIVITAMIN) tablet Take 1 tablet by mouth daily.    [provider]  potassium chloride  (KLOR-CON ) 10 MEQ tablet Take 10 mEq by mouth daily. 03/24/23   [provider]  pregabalin  (LYRICA ) 75 MG capsule TAKE 1 CAPSULE BY MOUTH TWICE  DAILY 03/19/24   Gayland Lauraine PARAS, NP  risperiDONE  (RISPERDAL ) 2 MG tablet Take 2 mg by mouth at bedtime.     [provider]  tamsulosin  (FLOMAX ) 0.4 MG CAPS capsule Take 0.4 mg by mouth daily after supper. 08/20/20   [provider]  thiamine  250 MG tablet Take  250 mg by mouth daily.    [provider]  traZODone  (DESYREL ) 100 MG tablet Take 200 mg by mouth at bedtime. 09/24/15   [provider]    Allergies: Cymbalta  [duloxetine  hcl], Erythromycin, Hctz [hydrochlorothiazide ], and Inderal  [propranolol ]    Review of Systems  Constitutional:  Negative for fever.  Gastrointestinal:  Positive for abdominal pain, diarrhea, nausea and vomiting. Negative for blood in stool.    Updated Vital Signs BP (!) 166/72 (BP Location: Left Arm)   Pulse (!) 118   Temp 98.7 F (37.1 C) (Oral)   Resp 18   SpO2 97%   Physical Exam CONSTITUTIONAL: Disheveled, chronically ill-appearing HEAD: Normocephalic/atraumatic, no evidence of trauma EYES: EOMI/PERRL, no icterus, conjunctiva pink ENMT: Mucous membranes dry NECK: supple no meningeal signs Spine no tenderness noted CV: S1/S2 noted, tachycardic LUNGS: Lungs are clear to auscultation bilaterally, no apparent distress ABDOMEN: soft, nontender, no rebound or guarding, bowel sounds noted throughout abdomen GU:no cva tenderness NEURO: Pt is awake/alert/appropriate, moves all extremitiesx4.  No facial droop.   EXTREMITIES: pulses normal/equal, full ROM SKIN: warm, color normal PSYCH: Anxious (all labs ordered are listed, but only abnormal results are displayed) Labs Reviewed  COMPREHENSIVE METABOLIC PANEL WITH GFR - Abnormal; Notable for the following components:      Result Value   Potassium 2.2 (*)    Chloride 97 (*)    CO2 21 (*)    Glucose, Bld 136 (*)    BUN 5 (*)    Anion gap 17 (*)    All other components within normal limits  CBC WITH DIFFERENTIAL/PLATELET - Abnormal; Notable for the following components:   WBC 14.3 (*)    HCT 36.6 (*)    Platelets 429 (*)    Neutro Abs 12.3 (*)    All other components within normal limits  LIPASE, BLOOD  ETHANOL  MAGNESIUM     EKG: EKG Interpretation Date/Time:  Monday July 30 2024 11:06:20 EDT Ventricular Rate:  89 PR  Interval:    QRS Duration:  101 QT Interval:  500 QTC Calculation: 609 R Axis:   65  Text Interpretation: indeterminate, suspect sinus rhythm Abnormal R-wave progression, early transition Minimal ST depression, lateral leads Interpretation limited secondary to artifact Confirmed by Midge Golas ([id number]) on 07/30/2024 11:21:34 AM  Radiology: No results found.   .Critical Care  Performed by: Midge Golas, MD Authorized by: Midge Golas, MD   Critical care provider statement:    Critical care time (minutes):  40   Critical care start time:  07/30/2024 11:00 AM   Critical care end time:  07/30/2024 11:40 AM   Critical care time was exclusive of:  Separately billable procedures and treating other patients  Critical care was necessary to treat or prevent imminent or life-threatening deterioration of the following conditions:  Shock, metabolic crisis and dehydration   Critical care was time spent personally by me on the following activities:  Obtaining history from patient or surrogate, examination of patient, development of treatment plan with patient or surrogate, re-evaluation of patient's condition, ordering and review of laboratory studies, ordering and performing treatments and interventions, pulse oximetry and evaluation of patient's response to treatment   I assumed direction of critical care for this patient from another provider in my specialty: no     Care discussed with: admitting provider      Medications Ordered in the ED  potassium chloride  10 mEq in 100 mL IVPB (10 mEq Intravenous New Bag/Given 07/30/24 1145)  LORazepam  (ATIVAN ) tablet 1-4 mg (has no administration in time range)    Or  LORazepam  (ATIVAN ) injection 1-4 mg (has no administration in time range)  thiamine  (VITAMIN B1) tablet 100 mg (has no administration in time range)    Or  thiamine  (VITAMIN B1) injection 100 mg (has no administration in time range)  folic acid  (FOLVITE ) tablet 1 mg (has no  administration in time range)  multivitamin with minerals tablet 1 tablet (has no administration in time range)  potassium chloride  10 mEq in 100 mL IVPB (has no administration in time range)  sodium chloride  0.9 % bolus 1,000 mL (1,000 mLs Intravenous New Bag/Given 07/30/24 1037)  ondansetron  (ZOFRAN ) injection 4 mg (4 mg Intravenous Given 07/30/24 1050)    Clinical Course as of 07/30/24 1147  Mon Jul 30, 2024  1051 WBC(!): 14.3 Moderate leukocytosis [DW]  1144 Potassium(!!): 2.2 Significant hypokalemia [DW]  1144 Patient with extensive medical history presents with nausea vomiting/diarrhea and decreased oral intake for the past 3 days.  Patient is tachycardic and appears dehydrated.  He had no focal abdominal tenderness, will defer CT imaging  Patient found to have significant hypokalemia likely due to his recent volume loss.  Will plan for admission [DW]  1146 I have discussed the case with Dr. Roxane for admission.  Patient would benefit from IV rehydration and replenishment of his potassium. Will continue to defer CT imaging  Patient denies any recent alcohol use, though will need to be monitored for alcohol withdrawal [DW]    Clinical Course User Index [DW] Midge Golas, MD                                 Medical Decision Making Amount and/or Complexity of Data Reviewed Labs: ordered. Decision-making details documented in ED Course. ECG/medicine tests: ordered.  Risk Prescription drug management. Decision regarding hospitalization.   This patient presents to the ED for concern of abdominal pain vomiting/diarrhea, this involves an extensive number of treatment options, and is a complaint that carries with it a high risk of complications and morbidity.  The differential diagnosis includes but is not limited to cholecystitis, cholelithiasis, pancreatitis, gastritis, peptic ulcer disease, appendicitis, bowel obstruction, bowel perforation, diverticulitis, AAA, ischemic bowel,  gastroenteritis   Comorbidities that complicate the patient evaluation: Patient's presentation is complicated by their history of hypertension, previous subdural  Social Determinants of Health: Patient's previous history of substance use disorder  increases the complexity of managing their presentation  Additional history obtained: Records reviewed previous admission documents  Lab Tests: I Ordered, and personally interpreted labs.  The pertinent results include: deHydration, hyperkalemia   Cardiac Monitoring: The patient was maintained  on a cardiac monitor.  I personally viewed and interpreted the cardiac monitor which showed an underlying rhythm of:  sinus tachycardia  Medicines ordered and prescription drug management: I ordered medication including Zofran  for nausea Reevaluation of the patient after these medicines showed that the patient    improved  Test Considered: I considered CT imaging, but since patient is improving without focal tenderness, will defer for now  Critical Interventions:   fluids, IV potassium  Consultations Obtained: I requested consultation with the admitting physician [facility], and discussed  findings as well as pertinent plan - they recommend: Admit  Reevaluation: After the interventions noted above, I reevaluated the patient and found that they have :improved  Complexity of problems addressed: Patient's presentation is most consistent with  acute presentation with potential threat to life or bodily function  Disposition: After consideration of the diagnostic results and the patient's response to treatment,  I feel that the patent would benefit from admission  .        Final diagnoses:  Nausea vomiting and diarrhea  Dehydration  Hypokalemia    ED Discharge Orders     None          Midge Golas, MD 07/30/24 1148

## 2024-07-30 NOTE — Telephone Encounter (Signed)
 Requested Prescriptions   Pending Prescriptions Disp Refills   pregabalin  (LYRICA ) 75 MG capsule [Pharmacy Med Name: Pregabalin  75 MG Oral Capsule] 180 capsule     Sig: TAKE 1 CAPSULE BY MOUTH TWICE  DAILY   Last seen 02/01/24 Next appt not scheduled and was supposed to be tomorrow but cancelled by the pt  Dispenses   Dispensed Days Supply Quantity Provider Pharmacy  PREGABALIN   75 MG CAPS 06/08/2024 90 180 capsule Gayland Lauraine PARAS, NP [facility]  PREGABALIN   75 MG CAPS 03/19/2024 90 180 capsule Gayland Lauraine PARAS, NP [facility]  PREGABALIN   75 MG CAPS 12/08/2023 90 180 capsule  [facility]  PREGABALIN   75 MG CAPS 09/05/2023 90 180 capsule Gayland Lauraine PARAS, NP [facility]

## 2024-07-30 NOTE — ED Triage Notes (Signed)
 Pt presents to ED from home C/O n/v/d X 3 days.

## 2024-07-30 NOTE — Progress Notes (Signed)
 MEWS Progress Note  Patient Details Name: LEWIN PELLOW MRN: [id number] DOB: [birth year] Today's Date: 07/30/2024   MEWS Flowsheet Documentation:  Assess: MEWS Score Temp: 98.7 F (37.1 C) BP: (!) 159/69 MAP (mmHg): 88 Pulse Rate: 95 ECG Heart Rate: (!) 119 Resp: 20 Level of Consciousness: Alert SpO2: 98 % O2 Device: Room Air Assess: MEWS Score MEWS Temp: 0 MEWS Systolic: 0 MEWS Pulse: 0 MEWS RR: 0 MEWS LOC: 0 MEWS Score: 0 MEWS Score Color: Green Assess: SIRS CRITERIA SIRS Temperature : 0 SIRS Respirations : 0 SIRS Pulse: 1 SIRS WBC: 0 SIRS Score Sum : 1 Assess: if the MEWS score is Yellow or Red Were vital signs accurate and taken at a resting state?: Yes Does the patient meet 2 or more of the SIRS criteria?: No MEWS guidelines implemented : Yes, yellow Treat MEWS Interventions: Considered administering scheduled or prn medications/treatments as ordered Take Vital Signs Increase Vital Sign Frequency : Yellow: Q2hr x1, continue Q4hrs until patient remains green for 12hrs Escalate MEWS: Escalate: Yellow: Discuss with charge nurse and consider notifying provider and/or RRT        Blane KANDICE Baller 07/30/2024, 6:11 PM

## 2024-07-31 ENCOUNTER — Ambulatory Visit: Admitting: Neurology

## 2024-07-31 DIAGNOSIS — R197 Diarrhea, unspecified: Secondary | ICD-10-CM

## 2024-07-31 DIAGNOSIS — R1110 Vomiting, unspecified: Secondary | ICD-10-CM | POA: Diagnosis not present

## 2024-07-31 LAB — BASIC METABOLIC PANEL WITH GFR
Anion gap: 11 (ref 5–15)
BUN: 7 mg/dL — ABNORMAL LOW (ref 8–23)
CO2: 21 mmol/L — ABNORMAL LOW (ref 22–32)
Calcium: 8.2 mg/dL — ABNORMAL LOW (ref 8.9–10.3)
Chloride: 111 mmol/L (ref 98–111)
Creatinine, Ser: 0.84 mg/dL (ref 0.61–1.24)
GFR, Estimated: >60 mL/min (ref >60)
Glucose, Bld: 91 mg/dL (ref 70–99)
Potassium: 3.2 mmol/L — ABNORMAL LOW (ref 3.5–5.1)
Sodium: 142 mmol/L (ref 135–145)

## 2024-07-31 LAB — HEPATIC FUNCTION PANEL
ALT: 14 U/L (ref 0–44)
AST: 30 U/L (ref 15–41)
Albumin: 3.2 g/dL — ABNORMAL LOW (ref 3.5–5.0)
Alkaline Phosphatase: 72 U/L (ref 38–126)
Bilirubin, Direct: 0.2 mg/dL (ref 0.0–0.2)
Indirect Bilirubin: 0.2 mg/dL — ABNORMAL LOW (ref 0.3–0.9)
Total Bilirubin: 0.4 mg/dL (ref 0.0–1.2)
Total Protein: 5.2 g/dL — ABNORMAL LOW (ref 6.5–8.1)

## 2024-07-31 LAB — CBC
HCT: 34.5 % — ABNORMAL LOW (ref 39.0–52.0)
Hemoglobin: 11.6 g/dL — ABNORMAL LOW (ref 13.0–17.0)
MCH: 30 pg (ref 26.0–34.0)
MCHC: 33.6 g/dL (ref 30.0–36.0)
MCV: 89.1 fL (ref 80.0–100.0)
Platelets: 306 K/uL (ref 150–400)
RBC: 3.87 MIL/uL — ABNORMAL LOW (ref 4.22–5.81)
RDW: 14.6 % (ref 11.5–15.5)
WBC: 8.1 K/uL (ref 4.0–10.5)
nRBC: 0 % (ref 0.0–0.2)

## 2024-07-31 LAB — HIV ANTIBODY (ROUTINE TESTING W REFLEX): HIV Screen 4th Generation wRfx: NONREACTIVE

## 2024-07-31 LAB — PHOSPHORUS: Phosphorus: 2.5 mg/dL (ref 2.5–4.6)

## 2024-07-31 LAB — MAGNESIUM: Magnesium: 2 mg/dL (ref 1.7–2.4)

## 2024-07-31 MED ORDER — POTASSIUM CHLORIDE CRYS ER 20 MEQ PO TBCR
40.0000 meq | EXTENDED_RELEASE_TABLET | Freq: Two times a day (BID) | ORAL | Status: AC
  Administered 2024-07-31 (×2): 40 meq via ORAL
  Filled 2024-07-31 (×2): qty 2

## 2024-07-31 NOTE — Hospital Course (Addendum)
 Jose Li is a 68 y.o. male with medical history significant for CKD stage III, hypertension, hyperlipidemia, orthostatic hypotension due to autonomic dysfunction, seizure disorder and history of subdural hematoma requiring craniotomy who is being admitted to the hospital with 3 days of subjective fever, nausea vomiting and diarrhea.  Patient states he lives on his own, has not been around anybody sick and has been having intractable vomiting and diarrhea for the last 3 days.  This is subsequently improving and will continue supportive care and Anticipate D/C in the next 24 hours.  Assessment and Plan:  Nausea vomiting and diarrhea: likely viral process, no evidence of acute bacterial infection but will evaluate for it as patient stated he last ate at [employer].  Abdomen is soft and nontender, nonfocal.  No indication for imaging. C Diff Negative. GI Pathogen Panel pending. CLD advanced to Soft. IVF now stopped. C/w Supportive care with pain and antiemetic medications as needed. Anticipate D/C in the next 24 hours as diarrhea is slowing down.    Hypokalemia: He does have chronic hypokalemia, takes potassium daily.  Presents with hypokalemia now due to GI losses. Mag Level was 2.0. K+ Trend:  Recent Labs  Lab 07/30/24 1025 07/30/24 1636 07/31/24 0501 08/01/24 0541  K 2.2* 2.7* 3.2* 4.6  -Replete w/ po KCL 40 mEQ BID x2. CTM and replete as necessary.    History of Seizure Disorder: C/w Lacosamide  150 mg po BID and Levetiracetam  1500 mg po BID    Bipolar Disorder/Depression/Anxeity/Tardive Dyskinesia: Continue Benztropine  1 mg po at bedtime, Buspirone  5 mg po BID, Citalopram  20 mg po Daily, Risperidone  2 mg po at bedtime and Trazodone  300 mg po qHS   BPH: C/w Tamsulosin  0.4 mg po at bedtime and  Finasteride  5 mg po Daily    Autonomic Dysfunction: Continue Midodrine  10 mg 3 times daily; Check Orthostatic VS in the AM. Obtain PT/OT evaluation   CKD stage IIIa / Metabolic Acidosis: Renal  function appears to be at baseline. BUN/Cr Trend: Recent Labs  Lab 07/30/24 1025 07/30/24 1636 07/31/24 0501 08/01/24 0541  BUN 5* 6* 7* 10  CREATININE 1.02 0.89 0.84 0.82  -S/p IVF. Avoid Nephrotoxic Medications, Contrast Dyes, Hypotension and Dehydration to Ensure Adequate Renal Perfusion and will need to Renally Adjust Meds. CTM and Trend Renal Function carefully and repeat CMP in the AM   Normocytic Anemia: Hgb/Hct Trend:  Recent Labs  Lab 07/30/24 1025 07/31/24 0711 08/01/24 0541  HGB 13.0 11.6* 12.5*  HCT 36.6* 34.5* 37.1*  MCV 84.9 89.1 89.0  -Check Anemia Panel in the AM. CTM for S/Sx of Bleeding; No overt bleeding noted. Repeat CBC in the AM  Hypoalbuminemia: Patient's Albumin Lvl went from 4.2 -> 3.2. CTM and Trend and repeat CMP in the AM

## 2024-07-31 NOTE — Care Management Obs Status (Signed)
 MEDICARE OBSERVATION STATUS NOTIFICATION   Patient Details  Name: Jose Li MRN: [id number] Date of Birth: 01/31/1956   Medicare Observation Status Notification Given:  Yes    Kailani Brass A Esma Kilts, LCSW 07/31/2024, 2:20 PM

## 2024-07-31 NOTE — Progress Notes (Signed)
   07/30/24 2132  BiPAP/CPAP/SIPAP  Reason BIPAP/CPAP not in use Nausea/Vomiting

## 2024-07-31 NOTE — TOC Initial Note (Signed)
 Transition of Care [facility]) - Initial/Assessment Note    Patient Details  Name: Jose Li MRN: [id number] Date of Birth: 09-Oct-1956  Transition of Care [facility]) CM/SW Contact:    Heather DELENA Saltness, LCSW Phone Number: 07/31/2024, 3:50 PM  Clinical Narrative:                 TOC consulted for substance abuse counseling/education. Pt denies current alcohol or drug use. Pt reports history of smoking cigarettes. Smoking cessation resources added to AVS. No further TOC needs at this time.  Expected Discharge Plan: Home/Self Care Barriers to Discharge: Continued Medical Work up   Patient Goals and CMS Choice Patient states their goals for this hospitalization and ongoing recovery are:: To return home        Expected Discharge Plan and Services In-house Referral: Clinical Social Work Discharge Planning Services: NA Post Acute Care Choice: NA Living arrangements for the past 2 months: Single Family Home                 DME Arranged: N/A DME Agency: NA       HH Arranged: NA HH Agency: NA        Prior Living Arrangements/Services Living arrangements for the past 2 months: Single Family Home Lives with:: Self Patient language and need for interpreter reviewed:: Yes Do you feel safe going back to the place where you live?: Yes      Need for Family Participation in Patient Care: No (Comment) Care giver support system in place?: No (comment)   Criminal Activity/Legal Involvement Pertinent to Current Situation/Hospitalization: No - Comment as needed  Activities of Daily Living   ADL Screening (condition at time of admission) Independently performs ADLs?: Yes (appropriate for developmental age) Is the patient deaf or have difficulty hearing?: No Does the patient have difficulty seeing, even when wearing glasses/contacts?: Yes Does the patient have difficulty concentrating, remembering, or making decisions?: No  Permission Sought/Granted   Permission granted to share information  with : No     Emotional Assessment Appearance:: Appears stated age, Well-Groomed Attitude/Demeanor/Rapport: Engaged Affect (typically observed): Pleasant, Appropriate Orientation: : Oriented to Self, Oriented to Place, Oriented to  Time, Oriented to Situation Alcohol / Substance Use: Tobacco Use Psych Involvement: No (comment)  Admission diagnosis:  Dehydration [E86.0] Hypokalemia [E87.6] Intractable vomiting [R11.10] Nausea vomiting and diarrhea [R11.2, R19.7] Patient Active Problem List   Diagnosis Date Noted   Intractable vomiting 07/30/2024   Gastroenteritis 04/06/2024   Chronic pain of both knees 07/12/2023   Dysuria 01/25/2023   Hospital discharge follow-up 09/23/2022   Orthostatic hypotension 08/24/2022   Rhabdomyolysis 08/24/2022   Hypomagnesemia 08/24/2022   Elevated troponin 08/24/2022   Dizziness 07/26/2022   Tachycardia 07/26/2022   Pressure injury of skin 06/21/2022   Malnutrition of moderate degree 06/10/2022   Status post craniotomy 06/08/2022   COVID-19 virus infection 04/14/2022   Anterior chest wall pain 12/06/2021   Need for immunization against influenza 09/11/2020   Pain of paraspinal muscle 01/14/2020   History of BPH 09/24/2019   Chronic kidney disease (CKD), stage III (moderate) (HCC) 07/13/2019   Varicose veins of right lower extremity with complications 01/24/2017   TBI (traumatic brain injury) (HCC)    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 [place]  [address] Phone: [phone] Fax: [phone]     Social Drivers of Health (SDOH) Social History: SDOH Screenings   Food Insecurity: No Food Insecurity (07/30/2024)  Housing: Low Risk  (07/30/2024)  Transportation Needs: Unmet Transportation Needs (07/30/2024)  Utilities: Not At Risk (07/30/2024)  Alcohol Screen: Low Risk  (05/07/2024)  Depression (PHQ2-9): High Risk (05/07/2024)  Financial Resource Strain: Low Risk  (05/07/2024)  Physical Activity: Inactive (05/07/2024)  Social Connections: Socially Isolated (07/30/2024)  Stress: Stress Concern Present (05/07/2024)  Tobacco Use: High Risk (07/30/2024)  Health Literacy: Adequate Health Literacy (05/07/2024)   SDOH Interventions: None indicated     Readmission Risk Interventions    08/25/2022    1:50 PM  Readmission Risk Prevention Plan  Transportation Screening Complete  PCP or Specialist Appt within 3-5 Days Complete  HRI or Home Care Consult Complete  Social Work Consult for Recovery Care Planning/Counseling Complete  Palliative Care Screening Not Applicable  Medication Review [employer]) Complete    Signed: Heather Saltness, MSW, LCSW Clinical Social Worker Inpatient Care Management 07/31/2024 3:54 PM

## 2024-07-31 NOTE — Plan of Care (Signed)
  Problem: Activity: Goal: Risk for activity intolerance will decrease Outcome: Progressing   Problem: Nutrition: Goal: Adequate nutrition will be maintained Outcome: Progressing   Problem: Coping: Goal: Level of anxiety will decrease Outcome: Progressing   Problem: Clinical Measurements: Goal: Cardiovascular complication will be avoided Outcome: Progressing   Problem: Pain Managment: Goal: General experience of comfort will improve and/or be controlled Outcome: Progressing

## 2024-07-31 NOTE — Progress Notes (Signed)
 PROGRESS NOTE    Jose Li  FMW:[id number] DOB: 08-Aug-1956 DOA: 07/30/2024 PCP: Diona Perkins, MD   Brief Narrative:  Jose Li is a 68 y.o. male with medical history significant for CKD stage III, hypertension, hyperlipidemia, orthostatic hypotension due to autonomic dysfunction, seizure disorder and history of subdural hematoma requiring craniotomy who is being admitted to the hospital with 3 days of subjective fever, nausea vomiting and diarrhea.  Patient states he lives on his own, has not been around anybody sick and has been having intractable vomiting and diarrhea for the last 3 days.  This is subsequently improving and will continue supportive care and Anticipate D/C in the next 24 hours.  Assessment and Plan:  Nausea vomiting and diarrhea: likely viral process, no evidence of acute bacterial infection but will evaluate for it as patient stated he last ate at [employer].  Abdomen is soft and nontender, nonfocal.  No indication for imaging. C Diff Negative. GI Pathogen Panel pending. CLD advanced to Soft. IVF now stopped. C/w Supportive care with pain and antiemetic medications as needed. Anticipate D/C in the next 24 hours as diarrhea is slowing down.    Hypokalemia: He does have chronic hypokalemia, takes potassium daily.  Presents with hypokalemia now due to GI losses. Mag Level was 2.0. K+ Trend:  Recent Labs  Lab 07/30/24 1025 07/30/24 1636 07/31/24 0501  K 2.2* 2.7* 3.2*  -Replete w/ po KCL 40 mEQ BID x2. CTM and replete as necessary.    History of Seizure Disorder: C/w Lacosamide  150 mg po BID and Levetiracetam  1500 mg po BID    Bipolar Disorder/Depression/Anxeity/Tardive Dyskinesia: Continue Benztropine  1 mg po at bedtime, Buspirone  5 mg po BID, Citalopram  20 mg po Daily, Risperidone  2 mg po at bedtime and Trazodone  300 mg po qHS   BPH: C/w Tamsulosin  0.4 mg po at bedtime and  Finasteride  5 mg po Daily    Autonomic Dysfunction: Continue Midodrine  10 mg 3 times daily;  Check Orthostatic VS in the AM. Obtain PT/OT evaluation   CKD stage IIIa: Renal function appears to be at baseline. BUN/Cr Trend: Recent Labs  Lab 07/30/24 1025 07/30/24 1636 07/31/24 0501  BUN 5* 6* 7*  CREATININE 1.02 0.89 0.84  -S/p IVF. Avoid Nephrotoxic Medications, Contrast Dyes, Hypotension and Dehydration to Ensure Adequate Renal Perfusion and will need to Renally Adjust Meds. CTM and Trend Renal Function carefully and repeat CMP in the AM   Normocytic Anemia: Hgb/Hct Trend:  Recent Labs  Lab 07/30/24 1025 07/31/24 0711  HGB 13.0 11.6*  HCT 36.6* 34.5*  MCV 84.9 89.1  -Check Anemia Panel in the AM. CTM for S/Sx of Bleeding; No overt bleeding noted. Repeat CBC in the AM  Hypoalbuminemia: Patient's Albumin Lvl went from 4.2 -> 3.2. CTM and Trend and repeat CMP in the AM    DVT prophylaxis: enoxaparin  (LOVENOX ) injection 40 mg Start: 07/30/24 2200    Code Status: Full Code Family Communication: No family present @ bedside  Disposition Plan:  Level of care: Telemetry Status is: Observation The patient remains OBS appropriate and will d/c before 2 midnights.   Consultants:  None  Procedures:  None  Antimicrobials:  Anti-infectives (From admission, onward)    None       Subjective: Seen and examined at bedside and he thinks his diarrhea is slowing down.  No nausea or vomiting now.  Feels better and denies any chest pain or shortness of breath.  No other concerns or complaints at this time.  Objective: Vitals:   07/30/24 2147 07/31/24 0047 07/31/24 0526 07/31/24 1543  BP: (!) 146/65  124/60 (!) 146/74  Pulse: 74  63 83  Resp: 16  16 18   Temp: 98.9 F (37.2 C)  99 F (37.2 C) 99.1 F (37.3 C)  TempSrc: Oral  Oral Oral  SpO2: 95%  98% 96%  Weight:  76.7 kg    Height:  5' 11 (1.803 m)      Intake/Output Summary (Last 24 hours) at 07/31/2024 1732 Last data filed at 07/30/2024 1800 Gross per 24 hour  Intake 700.91 ml  Output --  Net 700.91 ml    Filed Weights   07/31/24 0047  Weight: 76.7 kg   Examination: Physical Exam:  Constitutional: WN/WD Caucasian male in no acute distress Respiratory: Diminished to auscultation bilaterally, no wheezing, rales, rhonchi or crackles. Normal respiratory effort and patient is not tachypenic. No accessory muscle use.  Cardiovascular: RRR, no murmurs / rubs / gallops. S1 and S2 auscultated.  Mild extremity edema. Abdomen: Soft, non-tender, non-distended. Bowel sounds positive.  GU: Deferred. Musculoskeletal: No clubbing / cyanosis of digits/nails. No joint deformity upper and lower extremities. Skin: No rashes, lesions, ulcers on limited skin evaluation. No induration; Warm and dry.  Neurologic: CN 2-12 grossly intact with no focal deficits. Romberg sign and cerebellar reflexes not assessed.  Psychiatric: Normal judgment and insight. Alert and oriented x 3. Normal mood and appropriate affect.   Data Reviewed: I have personally reviewed following labs and imaging studies  CBC: Recent Labs  Lab 07/30/24 1025 07/31/24 0711  WBC 14.3* 8.1  NEUTROABS 12.3*  --   HGB 13.0 11.6*  HCT 36.6* 34.5*  MCV 84.9 89.1  PLT 429* 306   Basic Metabolic Panel: Recent Labs  Lab 07/30/24 1025 07/30/24 1636 07/31/24 0501  NA 135 141 142  K 2.2* 2.7* 3.2*  CL 97* 106 111  CO2 21* 23 21*  GLUCOSE 136* 107* 91  BUN 5* 6* 7*  CREATININE 1.02 0.89 0.84  CALCIUM  9.6 8.6* 8.2*  MG 1.8  --  2.0  PHOS  --   --  2.5   GFR: Estimated Creatinine Clearance: 92.1 mL/min (by C-G formula based on SCr of 0.84 mg/dL). Liver Function Tests: Recent Labs  Lab 07/30/24 1025 07/31/24 0501  AST 33 30  ALT 15 14  ALKPHOS 95 72  BILITOT 0.3 0.4  PROT 7.0 5.2*  ALBUMIN 4.2 3.2*   Recent Labs  Lab 07/30/24 1025  LIPASE 26   No results for input(s): AMMONIA in the last 168 hours. Coagulation Profile: No results for input(s): INR, PROTIME in the last 168 hours. Cardiac Enzymes: No results for  input(s): CKTOTAL, CKMB, CKMBINDEX, TROPONINI in the last 168 hours. BNP (last 3 results) No results for input(s): PROBNP in the last 8760 hours. HbA1C: No results for input(s): HGBA1C in the last 72 hours. CBG: No results for input(s): GLUCAP in the last 168 hours. Lipid Profile: No results for input(s): CHOL, HDL, LDLCALC, TRIG, CHOLHDL, LDLDIRECT in the last 72 hours. Thyroid  Function Tests: No results for input(s): TSH, T4TOTAL, FREET4, T3FREE, THYROIDAB in the last 72 hours. Anemia Panel: No results for input(s): VITAMINB12, FOLATE, FERRITIN, TIBC, IRON, RETICCTPCT in the last 72 hours. Sepsis Labs: No results for input(s): PROCALCITON, LATICACIDVEN in the last 168 hours.  Recent Results (from the past 240 hours)  C Difficile Quick Screen w PCR reflex     Status: None   Collection Time: 07/30/24  4:31 PM  Specimen: STOOL  Result Value Ref Range Status   C Diff antigen NEGATIVE NEGATIVE Final   C Diff toxin NEGATIVE NEGATIVE Final   C Diff interpretation No C. difficile detected.  Final    Comment: Performed at [facility], [address], [place], [place] [address]    Radiology Studies: No results found.  Scheduled Meds:  aspirin  EC  81 mg Oral Daily   atorvastatin   40 mg Oral Daily   benztropine   1 mg Oral QHS   busPIRone   5 mg Oral BID   citalopram   20 mg Oral Daily   enoxaparin  (LOVENOX ) injection  40 mg Subcutaneous Q24H   finasteride   5 mg Oral Daily   folic acid   1 mg Oral Daily   lacosamide   150 mg Oral BID   levETIRAcetam   1,500 mg Oral BID   loratadine   10 mg Oral Daily   midodrine   10 mg Oral TID WC   multivitamin with minerals  1 tablet Oral Daily   potassium chloride   40 mEq Oral BID   pregabalin   75 mg Oral BID   risperiDONE   2 mg Oral QHS   tamsulosin   0.4 mg Oral QHS   thiamine   100 mg Oral Daily   Or   thiamine   100 mg Intravenous Daily   traZODone   300 mg Oral QHS    Continuous Infusions:   LOS: 0 days   Alejandro Marker, DO [facility] Available via Epic secure chat 7am-7pm After these hours, please refer to coverage provider listed on [email or web address] 07/31/2024, 5:32 PM

## 2024-07-31 NOTE — Plan of Care (Signed)

## 2024-08-01 DIAGNOSIS — R109 Unspecified abdominal pain: Secondary | ICD-10-CM

## 2024-08-01 DIAGNOSIS — R1110 Vomiting, unspecified: Secondary | ICD-10-CM | POA: Diagnosis not present

## 2024-08-01 LAB — CBC WITH DIFFERENTIAL/PLATELET
Abs Immature Granulocytes: 0.24 K/uL — ABNORMAL HIGH (ref 0.00–0.07)
Basophils Absolute: 0.1 K/uL (ref 0.0–0.1)
Basophils Relative: 2 %
Eosinophils Absolute: 0.2 K/uL (ref 0.0–0.5)
Eosinophils Relative: 3 %
HCT: 37.1 % — ABNORMAL LOW (ref 39.0–52.0)
Hemoglobin: 12.5 g/dL — ABNORMAL LOW (ref 13.0–17.0)
Immature Granulocytes: 4 %
Lymphocytes Relative: 33 %
Lymphs Abs: 2.1 K/uL (ref 0.7–4.0)
MCH: 30 pg (ref 26.0–34.0)
MCHC: 33.7 g/dL (ref 30.0–36.0)
MCV: 89 fL (ref 80.0–100.0)
Monocytes Absolute: 0.7 K/uL (ref 0.1–1.0)
Monocytes Relative: 10 %
Neutro Abs: 3.2 K/uL (ref 1.7–7.7)
Neutrophils Relative %: 48 %
Platelets: 302 K/uL (ref 150–400)
RBC: 4.17 MIL/uL — ABNORMAL LOW (ref 4.22–5.81)
RDW: 14.4 % (ref 11.5–15.5)
WBC: 6.6 K/uL (ref 4.0–10.5)
nRBC: 0 % (ref 0.0–0.2)

## 2024-08-01 LAB — COMPREHENSIVE METABOLIC PANEL WITH GFR
ALT: 15 U/L (ref 0–44)
AST: 29 U/L (ref 15–41)
Albumin: 3.3 g/dL — ABNORMAL LOW (ref 3.5–5.0)
Alkaline Phosphatase: 74 U/L (ref 38–126)
Anion gap: 12 (ref 5–15)
BUN: 10 mg/dL (ref 8–23)
CO2: 20 mmol/L — ABNORMAL LOW (ref 22–32)
Calcium: 8.7 mg/dL — ABNORMAL LOW (ref 8.9–10.3)
Chloride: 108 mmol/L (ref 98–111)
Creatinine, Ser: 0.82 mg/dL (ref 0.61–1.24)
GFR, Estimated: >60 mL/min (ref >60)
Glucose, Bld: 105 mg/dL — ABNORMAL HIGH (ref 70–99)
Potassium: 4.6 mmol/L (ref 3.5–5.1)
Sodium: 141 mmol/L (ref 135–145)
Total Bilirubin: 0.5 mg/dL (ref 0.0–1.2)
Total Protein: 5.6 g/dL — ABNORMAL LOW (ref 6.5–8.1)

## 2024-08-01 LAB — PHOSPHORUS: Phosphorus: 2.4 mg/dL — ABNORMAL LOW (ref 2.5–4.6)

## 2024-08-01 LAB — MAGNESIUM: Magnesium: 2.2 mg/dL (ref 1.7–2.4)

## 2024-08-01 MED ORDER — ONDANSETRON HCL 4 MG PO TABS: 4.0000 mg | ORAL_TABLET | Freq: Four times a day (QID) | ORAL | 0 refills | Status: DC | PRN

## 2024-08-01 MED ORDER — FOLIC ACID 1 MG PO TABS: 1.0000 mg | ORAL_TABLET | Freq: Every day | ORAL | 0 refills | Status: AC

## 2024-08-01 MED ORDER — K PHOS MONO-SOD PHOS DI & MONO 155-852-130 MG PO TABS
500.0000 mg | ORAL_TABLET | Freq: Once | ORAL | Status: AC
  Administered 2024-08-01: 500 mg via ORAL

## 2024-08-01 NOTE — Progress Notes (Signed)
 Per tele, pt had a 4 beat run of vtach non sustained . However, pt is resting comfortably, no signs of distress noted . Monitoring continues. SABRA SABRA

## 2024-08-01 NOTE — Evaluation (Signed)
 Physical Therapy Evaluation Patient Details Name: Jose Li MRN: [id number] DOB: [birth year] Today's Date: 08/01/2024  History of Present Illness  Pt is a 68 yo male admitted to [facility]ital on 07/30/2024 with nausea/vomiting and diarrhea.  Pt with norovirus vs food poisoning.  PMH: crani for subdural, CKD III, autonomic dysfunction with orthostatic hypotension, bipolar d/o, sz do.  Clinical Impression      Pt admitted with above diagnosis.  Pt currently with functional limitations due to the deficits listed below (see PT Problem List). Pt in bed when PT arrived. Pt agreeable to therapy. Pt reports feeling a little better now because he was able to void bowels. Pt reports some abdominal pain. Pt has hx of frequent falls, pt states has phone with him but does not always have the life alert, pt ed provided on use of RW in home vs [facility] for safety and stability, pt then reported a majority of his falls are OOB, PT reviewed via verbal and imaging bed bolsters and bumpers that can be added to EOB to allow pt to be more aware and locate EOB. Pt is mod I with bed mobility, S for sit to stand  and for gait tasks with RW and CGA progressing to close S min cues for posture, safety, Rw management  for 500 feet and no overt LOB. Pt left in bed, all needs in place. Pt will benefit from acute skilled PT to increase their independence and safety with mobility to allow discharge.       If plan is discharge home, recommend the following: A little help with walking and/or transfers;A little help with bathing/dressing/bathroom;Assistance with cooking/housework;Assist for transportation;Help with stairs or ramp for entrance   Can travel by private vehicle        Equipment Recommendations None recommended by PT  Recommendations for Other Services       Functional Status Assessment Patient has had a recent decline in their functional status and demonstrates the ability to make significant improvements in function in a  reasonable and predictable amount of time.     Precautions / Restrictions Precautions Precautions: Fall Recall of Precautions/Restrictions: Intact Precaution/Restrictions Comments: Pt states he falls about 4x a week at home. Restrictions Weight Bearing Restrictions Per Provider Order: No      Mobility  Bed Mobility Overal bed mobility: Modified Independent             General bed mobility comments: used bedrails    Transfers Overall transfer level: Needs assistance Equipment used: Rolling walker (2 wheels) Transfers: Sit to/from Stand Sit to Stand: Supervision           General transfer comment: cues for hand placement with encouragement to use Rw    Ambulation/Gait Ambulation/Gait assistance: Supervision Gait Distance (Feet): 500 Feet Assistive device: Rolling walker (2 wheels) Gait Pattern/deviations: Step-through pattern Gait velocity: slightly decreased     General Gait Details: pt demonstrated good reciprocal pattern, min cues for posture, proper distance from Rw and RW management with turn and approach to sitting surfaces, no reports of increased pain and pt reported mild fatigue from exertion, no overt LOB  pt ed provided on importance of use of Rw for safety and stabiltiy once returning home and having HH PT guide pt through steps to resume use of SPC  pt verbalized understanding  Stairs            Wheelchair Mobility     Tilt Bed    Modified Rankin (Stroke Patients Only)  Balance Overall balance assessment: Mild deficits observed, not formally tested, History of Falls                                           Pertinent Vitals/Pain Pain Assessment Pain Assessment: 0-10 Pain Score: 4  Breathing: normal Negative Vocalization: none Facial Expression: smiling or inexpressive Body Language: relaxed Consolability: no need to console PAINAD Score: 0 Pain Location: abdomen Pain Descriptors / Indicators: Aching,  Sore Pain Intervention(s): Monitored during session, Repositioned    Home Living Family/patient expects to be discharged to:: Private residence Living Arrangements: Alone Available Help at Discharge: Friend(s);Available PRN/intermittently Type of Home: House Home Access: Stairs to enter Entrance Stairs-Rails: None Entrance Stairs-Number of Steps: 3 Alternate Level Stairs-Number of Steps: 10 Home Layout: Two level Home Equipment: Agricultural consultant (2 wheels);Cane - single point      Prior Function Prior Level of Function : Independent/Modified Independent;History of Falls (last six months)             Mobility Comments: Pt uses can most of time but also has walker. Frequent falls ADLs Comments: Mod I outside of driving, pt has social support for IADLs     Extremity/Trunk Assessment        Lower Extremity Assessment Lower Extremity Assessment: Overall WFL for tasks assessed    Cervical / Trunk Assessment Cervical / Trunk Assessment: Normal  Communication   Communication Communication: No apparent difficulties    Cognition Arousal: Alert Behavior During Therapy: WFL for tasks assessed/performed   PT - Cognitive impairments: No apparent impairments                         Following commands: Intact       Cueing       General Comments      Exercises     Assessment/Plan    PT Assessment Patient needs continued PT services  PT Problem List Decreased activity tolerance;Decreased balance;Decreased mobility;Decreased coordination;Decreased knowledge of use of DME;Pain       PT Treatment Interventions DME instruction;Gait training;Stair training;Functional mobility training;Therapeutic activities;Therapeutic exercise;Balance training;Neuromuscular re-education;Patient/family education    PT Goals (Current goals can be found in the Care Plan section)  Acute Rehab PT Goals Patient Stated Goal: to get stronger and not fall PT Goal Formulation: With  patient Time For Goal Achievement: 08/15/24 Potential to Achieve Goals: Good    Frequency Min 3X/week     Co-evaluation               AM-PAC PT 6 Clicks Mobility  Outcome Measure Help needed turning from your back to your side while in a flat bed without using bedrails?: None Help needed moving from lying on your back to sitting on the side of a flat bed without using bedrails?: None Help needed moving to and from a bed to a chair (including a wheelchair)?: A Little Help needed standing up from a chair using your arms (e.g., wheelchair or bedside chair)?: A Little Help needed to walk in hospital room?: A Little Help needed climbing 3-5 steps with a railing? : A Lot 6 Click Score: 19    End of Session Equipment Utilized During Treatment: Gait belt Activity Tolerance: Patient tolerated treatment well;No increased pain Patient left: in bed;with call bell/phone within reach Nurse Communication: Mobility status PT Visit Diagnosis: Unsteadiness on feet (R26.81);Repeated falls (R29.6);Other abnormalities  of gait and mobility (R26.89);Pain Pain - part of body:  (abdomen)    Time: 8865-8846 PT Time Calculation (min) (ACUTE ONLY): 19 min   Charges:   PT Evaluation $PT Eval Low Complexity: 1 Low   PT General Charges $$ ACUTE PT VISIT: 1 Visit         [place], PT Acute Rehab   [place] VEAR Drone 08/01/2024, 1:57 PM

## 2024-08-01 NOTE — Evaluation (Signed)
 Occupational Therapy Evaluation Patient Details Name: Jose Li MRN: [id number] DOB: 29-Jan-1956 Today's Date: 08/01/2024   History of Present Illness   Pt is a 68 yo male admitted with nausea/vomiting and diarrhea.  Pt with norovirus vs food poisoning.  PMH: crani for subdural, CKD III, autonomic dysfunction with orthostatic hypotension, bipolar d/o, sz do.     Clinical Impressions Pt admitted with the above diagnosis and has the deficits outlined below. Pt would benefit from cont OT to increase independence and safety with basic adls and adl transfers. Pt lives at home alone with friends that check on him and bring groceries. Pt states he falls about 4x a week and usually gets himself back up. Pt has cell phone with him at all times. Pt states he has life alert but unsure if reliability on this. He states he does not use it. Feel pt is probably close to his baseline level of functioning and would benefit from [facility] at d/c to work on fall prevention.  Will continue to see acutely with that goal.     If plan is discharge home, recommend the following:   Assistance with cooking/housework     Functional Status Assessment   Patient has had a recent decline in their functional status and demonstrates the ability to make significant improvements in function in a reasonable and predictable amount of time.     Equipment Recommendations   BSC/3in1;Other (comment) (only if pt agreeable)     Recommendations for Other Services         Precautions/Restrictions   Precautions Precautions: Fall Recall of Precautions/Restrictions: Intact Precaution/Restrictions Comments: Pt states he falls about 4x a week at home. Restrictions Weight Bearing Restrictions Per Provider Order: No     Mobility Bed Mobility Overal bed mobility: Modified Independent             General bed mobility comments: used bedrails    Transfers Overall transfer level: Needs assistance Equipment used:  Rolling walker (2 wheels) Transfers: Sit to/from Stand Sit to Stand: Contact guard assist           General transfer comment: cues for hand placement      Balance Overall balance assessment: Mild deficits observed, not formally tested                                         ADL either performed or assessed with clinical judgement   ADL Overall ADL's : Needs assistance/impaired Eating/Feeding: Independent;Sitting   Grooming: Wash/dry hands;Wash/dry face;Oral care;Supervision/safety;Standing   Upper Body Bathing: Set up;Sitting   Lower Body Bathing: Supervison/ safety;Sit to/from stand;Cueing for compensatory techniques Lower Body Bathing Details (indicate cue type and reason): crossed legs to access feet Upper Body Dressing : Set up;Sitting   Lower Body Dressing: Contact guard assist;Sit to/from stand;Cueing for compensatory techniques   Toilet Transfer: Contact guard assist;Ambulation;Rolling walker (2 wheels);Comfort height toilet Toilet Transfer Details (indicate cue type and reason): Pt walked to bathroom with walker and cues to stand before walking due to h/o OH Toileting- Clothing Manipulation and Hygiene: Supervision/safety;Sit to/from stand       Functional mobility during ADLs: Contact guard assist;Rolling walker (2 wheels) General ADL Comments: Pt did not require hands on assist more than CG for balance.     Vision Baseline Vision/History: 1 Wears glasses Ability to See in Adequate Light: 0 Adequate Patient Visual Report: No change from baseline  Vision Assessment?: Yes Eye Alignment: Within Functional Limits Ocular Range of Motion: Within Functional Limits Alignment/Gaze Preference: Within Defined Limits Tracking/Visual Pursuits: Able to track stimulus in all quads without difficulty Saccades: Within functional limits Convergence: Within functional limits Visual Fields: No apparent deficits     Perception Perception: Within Functional  Limits       Praxis Praxis: WFL       Pertinent Vitals/Pain Pain Assessment Pain Assessment: 0-10 Pain Score: 6  Pain Location: abdomen Pain Descriptors / Indicators: Aching Pain Intervention(s): Monitored during session, Repositioned, Patient requesting pain meds-RN notified     Extremity/Trunk Assessment Upper Extremity Assessment Upper Extremity Assessment: Overall WFL for tasks assessed   Lower Extremity Assessment Lower Extremity Assessment: Defer to PT evaluation   Cervical / Trunk Assessment Cervical / Trunk Assessment: Normal   Communication Communication Communication: No apparent difficulties   Cognition Arousal: Alert Behavior During Therapy: Flat affect Cognition: No family/caregiver present to determine baseline, Cognition impaired     Awareness: Online awareness impaired, Intellectual awareness impaired Memory impairment (select all impairments): Short-term memory Attention impairment (select first level of impairment): Sustained attention Executive functioning impairment (select all impairments): Problem solving, Reasoning                   Following commands: Intact       Cueing  General Comments   Cueing Techniques: Verbal cues  Pt limited due to decresaed balance and h/o falls. Pt reports falling at least 4x a week at home. Usually gets up on own. Has cell phone with him and states he has a life alert but doesnt wear it.  Encouraged pt to use life alert and possibly walker more than cane.  PT to evaluate.   Exercises     Shoulder Instructions      Home Living Family/patient expects to be discharged to:: Private residence Living Arrangements: Alone Available Help at Discharge: Friend(s);Available PRN/intermittently Type of Home: House Home Access: Stairs to enter [employer] of Steps: 3 Entrance Stairs-Rails: None Home Layout: Two level Alternate Level Stairs-Number of Steps: 10 Alternate Level Stairs-Rails:  Left Bathroom Shower/Tub: Tub/shower unit;Curtain   Bathroom Toilet: Standard     Home Equipment: Agricultural consultant (2 wheels);Cane - single point          Prior Functioning/Environment Prior Level of Function : Independent/Modified Independent;History of Falls (last six months)             Mobility Comments: Pt uses can most of time but also has walker. Frequent falls ADLs Comments: Mod I outside of driving.    OT Problem List: Impaired balance (sitting and/or standing);Decreased cognition;Decreased safety awareness;Decreased knowledge of use of DME or AE;Decreased knowledge of precautions;Pain   OT Treatment/Interventions: Self-care/ADL training;Balance training;DME and/or AE instruction;Therapeutic activities      OT Goals(Current goals can be found in the care plan section)   Acute Rehab OT Goals Patient Stated Goal: to go home OT Goal Formulation: With patient Time For Goal Achievement: 08/15/24 Potential to Achieve Goals: Good ADL Goals Pt Will Perform Lower Body Bathing: with modified independence;sit to/from stand Pt Will Perform Lower Body Dressing: with modified independence;sit to/from stand Pt Will Perform Tub/Shower Transfer: Tub transfer;with modified independence;rolling walker;ambulating Additional ADL Goal #1: Pt will walk to bathroom and complete all toileting with rolling walker and mod I Additional ADL Goal #2: Pt will state 3 things he can do at home to be safer and avoid falls without cues.   OT Frequency:  Min 2X/week  Co-evaluation              AM-PAC OT 6 Clicks Daily Activity     Outcome Measure Help from another person eating meals?: None Help from another person taking care of personal grooming?: A Little Help from another person toileting, which includes using toliet, bedpan, or urinal?: A Little Help from another person bathing (including washing, rinsing, drying)?: A Little Help from another person to put on and taking off  regular upper body clothing?: A Little Help from another person to put on and taking off regular lower body clothing?: A Little 6 Click Score: 19   End of Session Equipment Utilized During Treatment: Rolling walker (2 wheels) Nurse Communication: Mobility status  Activity Tolerance: Patient tolerated treatment well Patient left: in chair;with call bell/phone within reach;with chair alarm set  OT Visit Diagnosis: Unsteadiness on feet (R26.81)                Time: 9156-9095 OT Time Calculation (min): 21 min Charges:  OT General Charges $OT Visit: 1 Visit OT Evaluation $OT Eval Moderate Complexity: 1 Mod  Joshua Silvano Dragon 08/01/2024, 9:22 AM

## 2024-08-02 ENCOUNTER — Telehealth: Payer: Self-pay

## 2024-08-02 NOTE — Transitions of Care (Post Inpatient/ED Visit) (Unsigned)
   08/02/2024  Name: Jose Li MRN: [id number] DOB: [birth year]  Today's TOC FU Call Status: Today's TOC FU Call Status:: Unsuccessful Call (1st Attempt) Unsuccessful Call (1st Attempt) Date: 08/02/24  Attempted to reach the patient regarding the most recent Inpatient/ED visit.  Follow Up Plan: Additional outreach attempts will be made to reach the patient to complete the Transitions of Care (Post Inpatient/ED visit) call.   Signature Julian Lemmings, LPN [facility] Nurse Health Advisor Direct Dial [phone]

## 2024-08-03 NOTE — Discharge Summary (Signed)
 Physician Discharge Summary   Patient: Jose Li MRN: [id number] DOB: [birth year]  Admit date:     07/30/2024  Discharge date: 08/01/2024  Discharge Physician: Alejandro Marker, DO   PCP: Diona Perkins, MD   Recommendations at discharge:   Follow-up with PCP within 1 to 2 weeks repeat CBC, CMP, mag, Phos within 1 week  Discharge Diagnoses: Principal Problem:   Intractable vomiting  Resolved Problems:   * No resolved hospital problems. *  Hospital Course: Jose Li is a 68 y.o. male with medical history significant for CKD stage III, hypertension, hyperlipidemia, orthostatic hypotension due to autonomic dysfunction, seizure disorder and history of subdural hematoma requiring craniotomy who is being admitted to the hospital with 3 days of subjective fever, nausea vomiting and diarrhea.  Patient states he lives on his own, has not been around anybody sick and has been having intractable vomiting and diarrhea for the last 3 days.  His symptoms subsequently improved and supportive care was continued.  He is medically stable and improved to his baseline and will need to follow-up with PCP within 1 to 2 weeks  Assessment and Plan:  Nausea vomiting and diarrhea: likely viral process, no evidence of acute bacterial infection but will evaluate for it as patient stated he last ate at [employer].  Abdomen is soft and nontender, nonfocal.  No indication for imaging. C Diff Negative. GI Pathogen Panel pending. CLD advanced to Soft. IVF now stopped. C/w Supportive care with pain and antiemetic medications as needed.  He is improved and diarrhea has improved and he is medically stable for discharge at this time.   Hypokalemia: He does have chronic hypokalemia, takes potassium daily.  Presents with hypokalemia now due to GI losses. Mag Level was 2.0. K+ Trend:  Recent Labs  Lab 07/30/24 1025 07/30/24 1636 07/31/24 0501 08/01/24 0541  K 2.2* 2.7* 3.2* 4.6  -Replete w/ po KCL 40 mEQ BID x2. CTM and  replete as necessary.  Repeat CMP within 1 week   History of Seizure Disorder: C/w Lacosamide  150 mg po BID and Levetiracetam  1500 mg po BID; continue with seizure precautions   Bipolar Disorder/Depression/Anxeity/Tardive Dyskinesia: Continue Benztropine  1 mg po at bedtime, Buspirone  5 mg po BID, Citalopram  20 mg po Daily, Risperidone  2 mg po at bedtime and Trazodone  300 mg po qHS   BPH: C/w Tamsulosin  0.4 mg po at bedtime and  Finasteride  5 mg po Daily    Autonomic Dysfunction: Continue Midodrine  10 mg 3 times daily; Check Orthostatic VS in the AM.  PT OT and recommended to follow-up   CKD stage IIIa / Metabolic Acidosis: Renal function appears to be at baseline. BUN/Cr Trend: Recent Labs  Lab 07/30/24 1025 07/30/24 1636 07/31/24 0501 08/01/24 0541  BUN 5* 6* 7* 10  CREATININE 1.02 0.89 0.84 0.82  -S/p IVF. Avoid Nephrotoxic Medications, Contrast Dyes, Hypotension and Dehydration to Ensure Adequate Renal Perfusion and will need to Renally Adjust Meds. CTM and Trend Renal Function carefully and repeat CMP within 1 week  Normocytic Anemia: Hgb/Hct Trend:  Recent Labs  Lab 07/30/24 1025 07/31/24 0711 08/01/24 0541  HGB 13.0 11.6* 12.5*  HCT 36.6* 34.5* 37.1*  MCV 84.9 89.1 89.0  -Check Anemia Panel in the AM. CTM for S/Sx of Bleeding; No overt bleeding noted. Repeat CBC in the AM  Hypoalbuminemia: Patient's Albumin Lvl went from 4.2 -> 3.2 is now 3.3. CTM and Trend and repeat CMP in the AM  Consultants: None Procedures performed: As delineated as above  Disposition: Home Diet recommendation:  Discharge Diet Orders (From admission, onward)     Start     Ordered   08/01/24 0000  Diet - low sodium heart healthy        08/01/24 1234           Cardiac and Carb modified diet DISCHARGE MEDICATION: Allergies as of 08/01/2024       Reactions   Bee Venom Other (See Comments)   Exact reaction not recalled, but is allergic   Cymbalta  [duloxetine  Hcl] Other (See Comments)    Hyponatremia    Erythromycin Other (See Comments)   Reaction happened during childhood- not recalled   Hctz [hydrochlorothiazide ] Other (See Comments)   Syncope   Inderal  [propranolol ] Other (See Comments)   Syncope        Medication List     TAKE these medications    aspirin  EC 81 MG tablet Take 81 mg by mouth in the morning.   atorvastatin  40 MG tablet Commonly known as: LIPITOR TAKE 1 TABLET BY MOUTH DAILY   benztropine  1 MG tablet Commonly known as: COGENTIN  Take 1 mg by mouth at bedtime.   bismuth subsalicylate 262 MG/15ML suspension Commonly known as: PEPTO BISMOL Take 30 mLs by mouth every 6 (six) hours as needed for diarrhea or loose stools.   Pepto Bismol Ultra 525 MG Tabs Generic drug: Bismuth Subsalicylate Take 525 mg by mouth every 6 (six) hours as needed (for diarrhea- if not taking the liquid version).   busPIRone  5 MG tablet Commonly known as: BUSPAR  Take 5 mg by mouth See admin instructions. Take 5 mg by mouth in the morning and afternoon   cetirizine  10 MG tablet Commonly known as: ZYRTEC  Take 10 mg by mouth daily.   citalopram  20 MG tablet Commonly known as: CELEXA  Take 20 mg by mouth in the morning.   feeding supplement Liqd Take 1 Container by mouth See admin instructions. Drink 1 container of CHOCOLATE Boost High Calorie formula liquid by mouth one to three times a day between meals as needed for nutritional supplementation   finasteride  5 MG tablet Commonly known as: PROSCAR  TAKE 1 TABLET BY MOUTH DAILY What changed: when to take this   folic acid  1 MG tablet Commonly known as: FOLVITE  Take 1 tablet (1 mg total) by mouth daily.   Lacosamide  150 MG Tabs Commonly known as: Vimpat  Take 1 tablet (150 mg total) by mouth in the morning and at bedtime.   levETIRAcetam  750 MG tablet Commonly known as: KEPPRA  Take 2 tablets (1,500 mg total) by mouth 2 (two) times daily. What changed: when to take this   midodrine  5 MG tablet Commonly  known as: PROAMATINE  Take 5-10 mg by mouth See admin instructions. Take 10 mg by mouth in the morning and 5 mg at 2 PM- WITH MEALS What changed: Another medication with the same name was removed. Continue taking this medication, and follow the directions you see here.   multivitamin tablet Take 1 tablet by mouth daily with breakfast.   ondansetron  4 MG tablet Commonly known as: ZOFRAN  Take 1 tablet (4 mg total) by mouth every 6 (six) hours as needed for nausea.   OSTEO BI-FLEX ONE PER DAY PO Take 1 tablet by mouth every other day.   potassium chloride  10 MEQ tablet Commonly known as: KLOR-CON  Take 10 mEq by mouth daily.   pregabalin  75 MG capsule Commonly known as: LYRICA  TAKE 1 CAPSULE BY MOUTH TWICE  DAILY What changed: when to take  this   risperiDONE  2 MG tablet Commonly known as: RISPERDAL  Take 2 mg by mouth at bedtime.   tamsulosin  0.4 MG Caps capsule Commonly known as: FLOMAX  Take 0.4 mg by mouth at bedtime.   thiamine  250 MG tablet Take 250 mg by mouth daily.   traZODone  100 MG tablet Commonly known as: DESYREL  Take 300 mg by mouth at bedtime.   Tylenol  8 Hour Arthritis Pain 650 MG CR tablet Generic drug: acetaminophen  Take 650 mg by mouth every 8 (eight) hours as needed for pain (WHEN NOT TAKING THE 500 MG STRENGTH). What changed: Another medication with the same name was removed. Continue taking this medication, and follow the directions you see here.   TYLENOL  500 MG tablet Generic drug: acetaminophen  Take 500 mg by mouth every 8 (eight) hours as needed (for pain- WHEN NOT TAKING THE 650 MG STRENGTH). What changed: Another medication with the same name was removed. Continue taking this medication, and follow the directions you see here.   VICKS VAPOR INHALER IN Place 1 Inhalation into both nostrils as needed (for congestion).        Discharge Exam: Filed Weights   07/31/24 0047  Weight: 76.7 kg   Vitals:   08/01/24 0503 08/01/24 1210  BP: (!)  142/69 (!) 169/83  Pulse: 72 74  Resp: 20   Temp: 98.3 F (36.8 C) 98 F (36.7 C)  SpO2: 94% 99%   Examination: Physical Exam:  Constitutional: Caucasian male in no acute distress Respiratory: Diminished to auscultation bilaterally, no wheezing, rales, rhonchi or crackles. Normal respiratory effort and patient is not tachypenic. No accessory muscle use.  Cardiovascular: RRR, no murmurs / rubs / gallops. S1 and S2 auscultated. No extremity edema. Abdomen: Soft, non-tender, non-distended. Bowel sounds positive.  GU: Deferred. Musculoskeletal: No clubbing / cyanosis of digits/nails. No joint deformity upper and lower extremities.  Skin: No rashes, lesions, ulcers on limited skin evaluation. No induration; Warm and dry.  Neurologic: CN 2-12 grossly intact with no focal deficits. Romberg sign and cerebellar reflexes not assessed.  Psychiatric: Is awake and alert and oriented  Condition at discharge: stable  The results of significant diagnostics from this hospitalization (including imaging, microbiology, ancillary and laboratory) are listed below for reference.   Imaging Studies: No results found.  Microbiology: Results for orders placed or performed during the hospital encounter of 07/30/24  C Difficile Quick Screen w PCR reflex     Status: None   Collection Time: 07/30/24  4:31 PM   Specimen: STOOL  Result Value Ref Range Status   C Diff antigen NEGATIVE NEGATIVE Final   C Diff toxin NEGATIVE NEGATIVE Final   C Diff interpretation No C. difficile detected.  Final    Comment: Performed at [facility], [address] [place], [place] [address]   Labs: CBC: Recent Labs  Lab 07/30/24 1025 07/31/24 0711 08/01/24 0541  WBC 14.3* 8.1 6.6  NEUTROABS 12.3*  --  3.2  HGB 13.0 11.6* 12.5*  HCT 36.6* 34.5* 37.1*  MCV 84.9 89.1 89.0  PLT 429* 306 302   Basic Metabolic Panel: Recent Labs  Lab 07/30/24 1025 07/30/24 1636 07/31/24 0501 08/01/24 0541  NA  135 141 142 141  K 2.2* 2.7* 3.2* 4.6  CL 97* 106 111 108  CO2 21* 23 21* 20*  GLUCOSE 136* 107* 91 105*  BUN 5* 6* 7* 10  CREATININE 1.02 0.89 0.84 0.82  CALCIUM  9.6 8.6* 8.2* 8.7*  MG 1.8  --  2.0  2.2  PHOS  --   --  2.5 2.4*   Liver Function Tests: Recent Labs  Lab 07/30/24 1025 07/31/24 0501 08/01/24 0541  AST 33 30 29  ALT 15 14 15   ALKPHOS 95 72 74  BILITOT 0.3 0.4 0.5  PROT 7.0 5.2* 5.6*  ALBUMIN 4.2 3.2* 3.3*   CBG: No results for input(s): GLUCAP in the last 168 hours.  Discharge time spent: greater than 30 minutes.  Signed: Alejandro Marker, DO [facility] 08/03/2024

## 2024-08-03 NOTE — Transitions of Care (Post Inpatient/ED Visit) (Signed)
 08/03/2024  Name: Jose Li MRN: [id number] DOB: 1956/03/17  Today's TOC FU Call Status: Today's TOC FU Call Status:: Successful TOC FU Call Completed Unsuccessful Call (1st Attempt) Date: 08/02/24 [facility] FU Call Complete Date: 08/03/24 Patient's Name and Date of Birth confirmed.  Transition Care Management Follow-up Telephone Call Date of Discharge: 08/01/24 Discharge Facility: [facility]) Type of Discharge: Inpatient Admission Primary Inpatient Discharge Diagnosis:: N/V How have you been since you were released from the hospital?: Better Any questions or concerns?: No  Items Reviewed: Did you receive and understand the discharge instructions provided?: Yes Medications obtained,verified, and reconciled?: Yes (Medications Reviewed) Any new allergies since your discharge?: No Dietary orders reviewed?: Yes Do you have support at home?: No  Medications Reviewed Today: Medications Reviewed Today     Reviewed by Emmitt Pan, LPN (Licensed Practical Nurse) on 08/03/24 at 1118  Med List Status: <None>   Medication Order Taking? Sig Documenting Provider Last Dose Status Informant  Aromatic Inhalants (VICKS VAPOR INHALER IN) [id number] Yes Place 1 Inhalation into both nostrils as needed (for congestion). [provider]  Active Self  aspirin  81 MG EC tablet [id number] Yes Take 81 mg by mouth in the morning. [provider]  Active Self  atorvastatin  (LIPITOR) 40 MG tablet [id number] Yes TAKE 1 TABLET BY MOUTH DAILY Ross, Paula V, MD  Active Self  benztropine  (COGENTIN ) 1 MG tablet [id number] Yes Take 1 mg by mouth at bedtime. [provider]  Active Self  bismuth subsalicylate (PEPTO BISMOL) 262 MG/15ML suspension [id number] Yes Take 30 mLs by mouth every 6 (six) hours as needed for diarrhea or loose stools. [provider]  Active Self  Boswellia-Glucosamine-Vit D (OSTEO BI-FLEX ONE PER DAY PO) [id number] Yes Take 1 tablet by mouth every other day.  [provider]  Active Self  busPIRone  (BUSPAR ) 5 MG tablet [id number] Yes Take 5 mg by mouth See admin instructions. Take 5 mg by mouth in the morning and afternoon [provider]  Active Self  cetirizine  (ZYRTEC ) 10 MG tablet [id number] Yes Take 10 mg by mouth daily. [provider]  Active Self  citalopram  (CELEXA ) 20 MG tablet [id number] Yes Take 20 mg by mouth in the morning. [provider]  Active Self  feeding supplement (BOOST HIGH PROTEIN) LIQD [id number] Yes Take 1 Container by mouth See admin instructions. Drink 1 container of CHOCOLATE Boost High Calorie formula liquid by mouth one to three times a day between meals as needed for nutritional supplementation [provider]  Active Self  finasteride  (PROSCAR ) 5 MG tablet [id number] Yes TAKE 1 TABLET BY MOUTH DAILY  Patient taking differently: Take 5 mg by mouth in the morning.   Shona Layman BROCKS, MD  Active Self  folic acid  (FOLVITE ) 1 MG tablet [id number] Yes Take 1 tablet (1 mg total) by mouth daily. Sheikh, Omair London, DO  Active   Lacosamide  (VIMPAT ) 150 MG TABS [id number] Yes Take 1 tablet (150 mg total) by mouth in the morning and at bedtime. Gayland Lauraine PARAS, NP  Active Self  levETIRAcetam  (KEPPRA ) 750 MG tablet [id number] Yes Take 2 tablets (1,500 mg total) by mouth 2 (two) times daily.  Patient taking differently: Take 1,500 mg by mouth in the morning and at bedtime.   Gayland Lauraine PARAS, NP  Active Self  midodrine  (PROAMATINE ) 5 MG tablet [id number] Yes Take 5-10 mg by mouth See admin instructions. Take 10 mg by mouth in the morning and 5 mg at 2 PM- WITH  MEALS [provider]  Active Self  Multiple Vitamin (MULTIVITAMIN) tablet [id number] Yes Take 1 tablet by mouth daily with breakfast. [provider]  Active Self  ondansetron  (ZOFRAN ) 4 MG tablet [id number] Yes Take 1 tablet (4 mg total) by mouth every 6 (six) hours as needed for nausea. Sheikh, Omair Latif, DO  Active   PEPTO  BISMOL ULTRA 525 MG TABS [id number] Yes Take 525 mg by mouth every 6 (six) hours as needed (for diarrhea- if not taking the liquid version). [provider]  Active Self  potassium chloride  (KLOR-CON ) 10 MEQ tablet [id number] Yes Take 10 mEq by mouth daily. [provider]  Active Self  pregabalin  (LYRICA ) 75 MG capsule [id number] Yes TAKE 1 CAPSULE BY MOUTH TWICE  DAILY  Patient taking differently: Take 75 mg by mouth in the morning and at bedtime.   Gayland Lauraine PARAS, NP  Active Self  risperiDONE  (RISPERDAL ) 2 MG tablet [id number] Yes Take 2 mg by mouth at bedtime.  [provider]  Active Self  tamsulosin  (FLOMAX ) 0.4 MG CAPS capsule [id number] Yes Take 0.4 mg by mouth at bedtime. [provider]  Active Self  thiamine  250 MG tablet [id number] Yes Take 250 mg by mouth daily. [provider]  Active Self  traZODone  (DESYREL ) 100 MG tablet [id number] Yes Take 300 mg by mouth at bedtime. [provider]  Active Self           Med Note VIVIANN TILLMAN HERO   Tue Jan 27, 2016  8:59 PM)    TYLENOL  500 MG tablet [id number] Yes Take 500 mg by mouth every 8 (eight) hours as needed (for pain- WHEN NOT TAKING THE 650 MG STRENGTH). [provider]  Active Self  TYLENOL  8 HOUR ARTHRITIS PAIN 650 MG CR tablet [id number] Yes Take 650 mg by mouth every 8 (eight) hours as needed for pain (WHEN NOT TAKING THE 500 MG STRENGTH). [provider]  Active Self            Home Care and Equipment/Supplies: Were Home Health Services Ordered?: Yes Name of Home Health Agency:: unknown Has Agency set up a time to come to your home?: No Any new equipment or medical supplies ordered?: NA  Functional Questionnaire: Do you need assistance with bathing/showering or dressing?: No Do you need assistance with meal preparation?: No Do you need assistance with eating?: No Do you have difficulty maintaining continence: No Do you need assistance with getting out of  bed/getting out of a chair/moving?: No Do you have difficulty managing or taking your medications?: No  Follow up appointments reviewed: PCP Follow-up appointment confirmed?: No (sent message to staff to schedule) MD Provider Line Number:[phone] Given: No Specialist Hospital Follow-up appointment confirmed?: NA Do you need transportation to your follow-up appointment?: No Do you understand care options if your condition(s) worsen?: Yes-patient verbalized understanding    SIGNATURE Julian Lemmings, LPN [facility] Nurse Health Advisor Direct Dial [phone]

## 2024-08-09 ENCOUNTER — Telehealth: Payer: Self-pay | Admitting: Family Medicine

## 2024-08-09 NOTE — Telephone Encounter (Signed)
 Called patient to schedule-hospital follow up, no answer LVM to call back. Thanks!

## 2024-08-10 NOTE — Progress Notes (Signed)
 FAIZAAN FALLS                                          MRN: [id number]   08/10/2024   The [facility] Quality Team Specialist reviewed this patient medical record for the purposes of chart review for care gap closure. The following were reviewed: chart review for care gap closure-diabetic eye exam.    [facility] Quality Team

## 2024-08-15 ENCOUNTER — Other Ambulatory Visit: Payer: Self-pay | Admitting: Urology

## 2024-08-15 DIAGNOSIS — N401 Enlarged prostate with lower urinary tract symptoms: Secondary | ICD-10-CM

## 2024-08-16 ENCOUNTER — Other Ambulatory Visit: Payer: Self-pay

## 2024-08-16 MED ORDER — LACOSAMIDE 150 MG PO TABS: 150.0000 mg | ORAL_TABLET | Freq: Two times a day (BID) | ORAL | 1 refills | Status: DC

## 2024-08-16 NOTE — Telephone Encounter (Signed)
 Pt Last Seen 02/01/2024 No Upcoming Appointment  Lacosamide  150 mg 07/27/2024

## 2024-08-17 ENCOUNTER — Ambulatory Visit (INDEPENDENT_AMBULATORY_CARE_PROVIDER_SITE_OTHER): Admitting: Family Medicine

## 2024-08-17 ENCOUNTER — Encounter: Payer: Self-pay | Admitting: Family Medicine

## 2024-08-17 VITALS — BP 131/76 | HR 80 | Temp 97.9°F | Wt 173.4 lb

## 2024-08-17 DIAGNOSIS — K529 Noninfective gastroenteritis and colitis, unspecified: Secondary | ICD-10-CM | POA: Diagnosis not present

## 2024-08-17 MED ORDER — ONDANSETRON 4 MG PO TBDP: 4.0000 mg | ORAL_TABLET | Freq: Three times a day (TID) | ORAL | 0 refills | Status: DC | PRN

## 2024-08-17 NOTE — Assessment & Plan Note (Signed)
 Recently admitted (9/15-9/17) for intractable vomiting/diarrhea with electrolyte disturbance, received supportive care with improvement. C diff negative. Has been doing overall well since discharge. Vitals and exam stable today.  - Continue medications as is  - Refill Zofran   - CBC, CMP, Mag, Phos today as advised by hospital team

## 2024-08-17 NOTE — Progress Notes (Signed)
    SUBJECTIVE:   CHIEF COMPLAINT / HPI:   Intractable vomiting/diarrhea -Recently discharge from hospital for intractable vomiting/diarrhea  -Has since been feeling better -Has some continued nausea and abdominal pain - overall improving  -A couple episodes of vomiting, diarrhea - no blood, improving  -No fever -Taking all medications as prescribed at hospital discharge including potassium   PERTINENT  PMH / PSH: chronic hypokalemia, TBI, Seizures, CKD3, Bipolar  OBJECTIVE:   BP 131/76   Pulse 80   Temp 97.9 F (36.6 C) (Oral)   Wt 173 lb 6 oz (78.6 kg)   SpO2 100%   BMI 24.18 kg/m   General: Well-appearing. Resting comfortably in room. CV: Normal S1/S2. No extra heart sounds. Warm and well-perfused. Pulm: Breathing comfortably on room air. CTAB. No increased WOB. Abd: Normoactive BS. Soft, non-tender, non-distended. Psych: Pleasant and appropriate.    ASSESSMENT/PLAN:   Assessment & Plan Gastroenteritis Recently admitted (9/15-9/17) for intractable vomiting/diarrhea with electrolyte disturbance, received supportive care with improvement. C diff negative. Has been doing overall well since discharge. Vitals and exam stable today.  - Continue medications as is  - Refill Zofran   - CBC, CMP, Mag, Phos today as advised by hospital team    RTC as needed.   Damien Cassis, MD [facility]

## 2024-08-17 NOTE — Patient Instructions (Addendum)
 Thank you for visiting clinic today and allowing us  to participate in your care!  We are so glad you have been feeling better! We will check in on your labs today and let you know of the results. Please continue taking your medications as prescribed.   Please schedule an appointment as needed.   Reach out any time with any questions or concerns you may have - we are here for you!  Damien Cassis, MD [facility] [phone]

## 2024-08-18 LAB — COMPREHENSIVE METABOLIC PANEL WITH GFR
ALT: 12 IU/L (ref 0–44)
AST: 13 IU/L (ref 0–40)
Albumin: 4 g/dL (ref 3.9–4.9)
Alkaline Phosphatase: 81 IU/L (ref 47–123)
BUN/Creatinine Ratio: 14 (ref 10–24)
BUN: 13 mg/dL (ref 8–27)
Bilirubin Total: 0.3 mg/dL (ref 0.0–1.2)
CO2: 19 mmol/L — ABNORMAL LOW (ref 20–29)
Calcium: 9.1 mg/dL (ref 8.6–10.2)
Chloride: 106 mmol/L (ref 96–106)
Creatinine, Ser: 0.92 mg/dL (ref 0.76–1.27)
Globulin, Total: 2.4 g/dL (ref 1.5–4.5)
Glucose: 97 mg/dL (ref 70–99)
Potassium: 4 mmol/L (ref 3.5–5.2)
Sodium: 144 mmol/L (ref 134–144)
Total Protein: 6.4 g/dL (ref 6.0–8.5)
eGFR: 92 mL/min/1.73 (ref >59)

## 2024-08-18 LAB — PHOSPHORUS: Phosphorus: 4 mg/dL (ref 2.8–4.1)

## 2024-08-18 LAB — CBC
Hematocrit: 37.7 % (ref 37.5–51.0)
Hemoglobin: 12.2 g/dL — ABNORMAL LOW (ref 13.0–17.7)
MCH: 30.3 pg (ref 26.6–33.0)
MCHC: 32.4 g/dL (ref 31.5–35.7)
MCV: 94 fL (ref 79–97)
Platelets: 331 x10E3/uL (ref 150–450)
RBC: 4.03 x10E6/uL — ABNORMAL LOW (ref 4.14–5.80)
RDW: 13.1 % (ref 11.6–15.4)
WBC: 8.1 x10E3/uL (ref 3.4–10.8)

## 2024-08-18 LAB — MAGNESIUM: Magnesium: 2 mg/dL (ref 1.6–2.3)

## 2024-08-20 ENCOUNTER — Telehealth: Payer: Self-pay | Admitting: Neurology

## 2024-08-20 ENCOUNTER — Ambulatory Visit (HOSPITAL_COMMUNITY): Payer: Self-pay | Admitting: Family Medicine

## 2024-08-20 MED ORDER — LACOSAMIDE 150 MG PO TABS: 150.0000 mg | ORAL_TABLET | Freq: Two times a day (BID) | ORAL | 1 refills | Status: DC

## 2024-08-20 NOTE — Telephone Encounter (Signed)
 Meds ordered this encounter  Medications   Lacosamide  (VIMPAT ) 150 MG TABS    Sig: Take 1 tablet (150 mg total) by mouth in the morning and at bedtime.    Dispense:  180 tablet    Refill:  1

## 2024-08-27 NOTE — Progress Notes (Signed)
 Jose Li                                          MRN: [id number]   08/27/2024   The [facility] Quality Team Specialist reviewed this patient medical record for the purposes of chart review for care gap closure. The following were reviewed: chart review for care gap closure-glycemic status assessment.    [facility] Quality Team

## 2024-08-30 ENCOUNTER — Emergency Department (HOSPITAL_COMMUNITY)
Admission: EM | Admit: 2024-08-30 | Discharge: 2024-08-30 | Disposition: A | Discharge status: Home / Self Care | Attending: Emergency Medicine | Admitting: Emergency Medicine

## 2024-08-30 ENCOUNTER — Emergency Department (HOSPITAL_COMMUNITY)

## 2024-08-30 ENCOUNTER — Other Ambulatory Visit: Payer: Self-pay

## 2024-08-30 DIAGNOSIS — W19XXXA Unspecified fall, initial encounter: Secondary | ICD-10-CM

## 2024-08-30 DIAGNOSIS — R55 Syncope and collapse: Secondary | ICD-10-CM | POA: Diagnosis not present

## 2024-08-30 DIAGNOSIS — Y9301 Activity, walking, marching and hiking: Secondary | ICD-10-CM | POA: Insufficient documentation

## 2024-08-30 DIAGNOSIS — M542 Cervicalgia: Secondary | ICD-10-CM | POA: Diagnosis not present

## 2024-08-30 DIAGNOSIS — M25562 Pain in left knee: Secondary | ICD-10-CM | POA: Diagnosis not present

## 2024-08-30 DIAGNOSIS — M5030 Other cervical disc degeneration, unspecified cervical region: Secondary | ICD-10-CM | POA: Diagnosis not present

## 2024-08-30 DIAGNOSIS — S0081XA Abrasion of other part of head, initial encounter: Secondary | ICD-10-CM | POA: Diagnosis not present

## 2024-08-30 DIAGNOSIS — W108XXA Fall (on) (from) other stairs and steps, initial encounter: Secondary | ICD-10-CM | POA: Diagnosis not present

## 2024-08-30 DIAGNOSIS — M25552 Pain in left hip: Secondary | ICD-10-CM | POA: Diagnosis not present

## 2024-08-30 DIAGNOSIS — G9389 Other specified disorders of brain: Secondary | ICD-10-CM | POA: Diagnosis not present

## 2024-08-30 DIAGNOSIS — Z7982 Long term (current) use of aspirin: Secondary | ICD-10-CM | POA: Insufficient documentation

## 2024-08-30 DIAGNOSIS — I10 Essential (primary) hypertension: Secondary | ICD-10-CM | POA: Diagnosis not present

## 2024-08-30 DIAGNOSIS — S0990XA Unspecified injury of head, initial encounter: Secondary | ICD-10-CM

## 2024-08-30 DIAGNOSIS — M79605 Pain in left leg: Secondary | ICD-10-CM | POA: Diagnosis not present

## 2024-08-30 DIAGNOSIS — R404 Transient alteration of awareness: Secondary | ICD-10-CM | POA: Diagnosis not present

## 2024-08-30 DIAGNOSIS — N183 Chronic kidney disease, stage 3 unspecified: Secondary | ICD-10-CM | POA: Insufficient documentation

## 2024-08-30 DIAGNOSIS — I129 Hypertensive chronic kidney disease with stage 1 through stage 4 chronic kidney disease, or unspecified chronic kidney disease: Secondary | ICD-10-CM | POA: Insufficient documentation

## 2024-08-30 DIAGNOSIS — M1612 Unilateral primary osteoarthritis, left hip: Secondary | ICD-10-CM | POA: Diagnosis not present

## 2024-08-30 DIAGNOSIS — S199XXA Unspecified injury of neck, initial encounter: Secondary | ICD-10-CM | POA: Diagnosis not present

## 2024-08-30 LAB — COMPREHENSIVE METABOLIC PANEL WITH GFR
ALT: 13 U/L (ref 0–44)
AST: 20 U/L (ref 15–41)
Albumin: 4 g/dL (ref 3.5–5.0)
Alkaline Phosphatase: 79 U/L (ref 38–126)
Anion gap: 11 (ref 5–15)
BUN: 11 mg/dL (ref 8–23)
CO2: 21 mmol/L — ABNORMAL LOW (ref 22–32)
Calcium: 8.9 mg/dL (ref 8.9–10.3)
Chloride: 109 mmol/L (ref 98–111)
Creatinine, Ser: 1.21 mg/dL (ref 0.61–1.24)
GFR, Estimated: >60 mL/min (ref >60)
Glucose, Bld: 119 mg/dL — ABNORMAL HIGH (ref 70–99)
Potassium: 3.5 mmol/L (ref 3.5–5.1)
Sodium: 141 mmol/L (ref 135–145)
Total Bilirubin: 0.4 mg/dL (ref 0.0–1.2)
Total Protein: 6.6 g/dL (ref 6.5–8.1)

## 2024-08-30 LAB — CBC WITH DIFFERENTIAL/PLATELET
Abs Immature Granulocytes: 0.06 K/uL (ref 0.00–0.07)
Basophils Absolute: 0.1 K/uL (ref 0.0–0.1)
Basophils Relative: 0 %
Eosinophils Absolute: 0 K/uL (ref 0.0–0.5)
Eosinophils Relative: 0 %
HCT: 40 % (ref 39.0–52.0)
Hemoglobin: 12.5 g/dL — ABNORMAL LOW (ref 13.0–17.0)
Immature Granulocytes: 0 %
Lymphocytes Relative: 9 %
Lymphs Abs: 1.3 K/uL (ref 0.7–4.0)
MCH: 28.7 pg (ref 26.0–34.0)
MCHC: 31.3 g/dL (ref 30.0–36.0)
MCV: 92 fL (ref 80.0–100.0)
Monocytes Absolute: 1.5 K/uL — ABNORMAL HIGH (ref 0.1–1.0)
Monocytes Relative: 10 %
Neutro Abs: 12 K/uL — ABNORMAL HIGH (ref 1.7–7.7)
Neutrophils Relative %: 81 %
Platelets: 378 K/uL (ref 150–400)
RBC: 4.35 MIL/uL (ref 4.22–5.81)
RDW: 13.2 % (ref 11.5–15.5)
WBC: 14.8 K/uL — ABNORMAL HIGH (ref 4.0–10.5)
nRBC: 0 % (ref 0.0–0.2)

## 2024-08-30 LAB — ETHANOL: Alcohol, Ethyl (B): <15 mg/dL

## 2024-08-30 MED ORDER — FENTANYL CITRATE (PF) 50 MCG/ML IJ SOSY
50.0000 ug | PREFILLED_SYRINGE | Freq: Once | INTRAMUSCULAR | Status: AC
  Administered 2024-08-30: 50 ug via INTRAVENOUS
  Filled 2024-08-30: qty 1

## 2024-08-30 MED ORDER — SODIUM CHLORIDE 0.9 % IV BOLUS
1000.0000 mL | Freq: Once | INTRAVENOUS | Status: AC
  Administered 2024-08-30: 1000 mL via INTRAVENOUS

## 2024-08-30 NOTE — ED Provider Triage Note (Signed)
 Emergency Medicine Provider Triage Evaluation Note  HAEDEN HUDOCK , a 68 y.o. male  was evaluated in triage.  Pt complains of dizziness and fall.  Review of Systems  Positive:  Negative:   Physical Exam  BP 136/72 (BP Location: Right Arm)   Pulse (!) 112   Temp 98.5 F (36.9 C) (Oral)   Resp 14   SpO2 99%  Gen:   Awake, no distress   Resp:  Normal effort  MSK:   Moves extremities without difficulty  Other:    Medical Decision Making  Medically screening exam initiated at 3:48 PM.  Appropriate orders placed.  Arley JONETTA Dixons was informed that the remainder of the evaluation will be completed by another provider, this initial triage assessment does not replace that evaluation, and the importance of remaining in the ED until their evaluation is complete.  Pt with TBI, seizures reports he was walking down stairs and suddenly became dizzy on the last 2 steps and fell. He remembers everything, including impact so does not feel he had a seizure. He has not been ambulatory since. C/O left hip and lower extremity pain. Denies head injury. Reports he has had episodes of dizziness in the past but this was different. Dizziness has completely resolved now. No nausea, no chest or abdominal pain or injury. Not anticoagulated.   Odell Balls, PA-C 08/30/24 1552

## 2024-08-30 NOTE — ED Triage Notes (Signed)
 Pt arrives via EMS from. Pt was walking down steps when he became dizzy and fell down. Pt recalls the entire event. Pt has complaints generalized LEFT leg pain. Denies striking head.   Pt reports feeling at baseline leading up to event.

## 2024-08-30 NOTE — ED Notes (Signed)
 This pt was discharged by a different employee who did not remove them from the computer

## 2024-08-30 NOTE — ED Provider Notes (Signed)
 [place] [facility] AT [facility] Provider Note   CSN: [id number] Arrival date & time: 08/30/24  1513     Patient presents with: Fall and Leg Pain   Jose Li is a 68 y.o. male.   Patient is a 68 year old male with a history of seizures, hyperlipidemia, hypertension, substance abuse, chronic kidney disease who presents after a fall.  He said he slipped on some steps and fell down about 4-5 steps.  He is not sure what made him fall.  He told the provider in triage that he had had some dizziness that made him fall but he says he does not really remember if he had dizziness or not.  He said he was awake the whole time and did not have any seizure-like activity.  He did not have any chest pain or shortness of breath.  He has not had any recent illnesses.  No vomiting or diarrhea.  No cough or cold symptoms.  He says he twisted his left leg and complains of pain in his hip and knee.  He did hit his head.  There is no loss of consciousness.  He has some pain in his neck as well.  He is not on anticoagulants.       Prior to Admission medications   Medication Sig Start Date End Date Taking? Authorizing Provider  Aromatic Inhalants (VICKS VAPOR INHALER IN) Place 1 Inhalation into both nostrils as needed (for congestion).    [provider]  aspirin  81 MG EC tablet Take 81 mg by mouth in the morning.    [provider]  atorvastatin  (LIPITOR) 40 MG tablet TAKE 1 TABLET BY MOUTH DAILY 04/23/24   Okey Vina GAILS, MD  benztropine  (COGENTIN ) 1 MG tablet Take 1 mg by mouth at bedtime.    [provider]  bismuth subsalicylate (PEPTO BISMOL) 262 MG/15ML suspension Take 30 mLs by mouth every 6 (six) hours as needed for diarrhea or loose stools.    [provider]  Boswellia-Glucosamine-Vit D (OSTEO BI-FLEX ONE PER DAY PO) Take 1 tablet by mouth every other day.    [provider]  busPIRone  (BUSPAR ) 5 MG tablet Take 5 mg by mouth See  admin instructions. Take 5 mg by mouth in the morning and afternoon 07/06/17   [provider]  cetirizine  (ZYRTEC ) 10 MG tablet Take 10 mg by mouth daily.    [provider]  citalopram  (CELEXA ) 20 MG tablet Take 20 mg by mouth in the morning.    [provider]  feeding supplement (BOOST HIGH PROTEIN) LIQD Take 1 Container by mouth See admin instructions. Drink 1 container of CHOCOLATE Boost High Calorie formula liquid by mouth one to three times a day between meals as needed for nutritional supplementation    [provider]  finasteride  (PROSCAR ) 5 MG tablet TAKE 1 TABLET BY MOUTH DAILY Patient taking differently: Take 5 mg by mouth in the morning. 01/31/24   Shona Layman BROCKS, MD  folic acid  (FOLVITE ) 1 MG tablet Take 1 tablet (1 mg total) by mouth daily. 08/02/24   Sherrill Cable Latif, DO  Lacosamide  (VIMPAT ) 150 MG TABS Take 1 tablet (150 mg total) by mouth in the morning and at bedtime. 08/20/24   Gayland Lauraine PARAS, NP  levETIRAcetam  (KEPPRA ) 750 MG tablet Take 2 tablets (1,500 mg total) by mouth 2 (two) times daily. Patient taking differently: Take 1,500 mg by mouth in the morning and at bedtime. 02/01/24   Gayland Lauraine  J, NP  midodrine  (PROAMATINE ) 5 MG tablet Take 5-10 mg by mouth See admin instructions. Take 10 mg by mouth in the morning and 5 mg at 2 PM- WITH MEALS 07/29/24   [provider]  Multiple Vitamin (MULTIVITAMIN) tablet Take 1 tablet by mouth daily with breakfast.    [provider]  ondansetron  (ZOFRAN -ODT) 4 MG disintegrating tablet Take 1 tablet (4 mg total) by mouth every 8 (eight) hours as needed for nausea or vomiting. 08/17/24   Diona Perkins, MD  PEPTO BISMOL ULTRA 525 MG TABS Take 525 mg by mouth every 6 (six) hours as needed (for diarrhea- if not taking the liquid version).    [provider]  potassium chloride  (KLOR-CON ) 10 MEQ tablet Take 10 mEq by mouth daily. 03/24/23   [provider]  pregabalin  (LYRICA )  75 MG capsule TAKE 1 CAPSULE BY MOUTH TWICE  DAILY Patient taking differently: Take 75 mg by mouth in the morning and at bedtime. 03/19/24   Gayland Lauraine PARAS, NP  risperiDONE  (RISPERDAL ) 2 MG tablet Take 2 mg by mouth at bedtime.     [provider]  tamsulosin  (FLOMAX ) 0.4 MG CAPS capsule Take 0.4 mg by mouth at bedtime. 08/20/20   [provider]  thiamine  250 MG tablet Take 250 mg by mouth daily.    [provider]  traZODone  (DESYREL ) 100 MG tablet Take 300 mg by mouth at bedtime. 09/24/15   [provider]  TYLENOL  500 MG tablet Take 500 mg by mouth every 8 (eight) hours as needed (for pain- WHEN NOT TAKING THE 650 MG STRENGTH).    [provider]  TYLENOL  8 HOUR ARTHRITIS PAIN 650 MG CR tablet Take 650 mg by mouth every 8 (eight) hours as needed for pain (WHEN NOT TAKING THE 500 MG STRENGTH).    [provider]    Allergies: Bee venom, Cymbalta  [duloxetine  hcl], Erythromycin, Hctz [hydrochlorothiazide ], and Inderal  [propranolol ]    Review of Systems  Constitutional:  Negative for chills, diaphoresis, fatigue and fever.  HENT:  Negative for congestion, rhinorrhea and sneezing.   Eyes: Negative.   Respiratory:  Negative for cough, chest tightness and shortness of breath.   Cardiovascular:  Negative for chest pain and leg swelling.  Gastrointestinal:  Negative for abdominal pain, diarrhea, nausea and vomiting.  Genitourinary:  Negative for difficulty urinating, flank pain and frequency.  Musculoskeletal:  Positive for arthralgias and neck pain. Negative for back pain.  Skin:  Negative for rash.  Neurological:  Negative for dizziness, speech difficulty, weakness, numbness and headaches.    Updated Vital Signs BP 138/70   Pulse 74   Temp 98.7 F (37.1 C) (Oral)   Resp (!) 22   SpO2 96%   Physical Exam Constitutional:      Appearance: He is well-developed.  HENT:     Head: Normocephalic.     Comments: Small abrasion to his right  forehead Eyes:     Pupils: Pupils are equal, round, and reactive to light.  Neck:     Comments: Positive tenderness to the cervical spine.  No pain to the thoracic or lumbosacral spine Cardiovascular:     Rate and Rhythm: Normal rate and regular rhythm.     Heart sounds: Normal heart sounds.  Pulmonary:     Effort: Pulmonary effort is normal. No respiratory distress.     Breath sounds: Normal breath sounds. No wheezing or rales.  Chest:     Chest wall: No tenderness.  Abdominal:  General: Bowel sounds are normal.     Palpations: Abdomen is soft.     Tenderness: There is no abdominal tenderness. There is no guarding or rebound.  Musculoskeletal:        General: Normal range of motion.     Comments: Positive tenderness on palpation of his left hip and left knee.  He is able to move it normally without significant pain.  No gross ligament instability of his knee.  No significant swelling.  No pain to the ankle.  Pedal pulses intact.  Lymphadenopathy:     Cervical: No cervical adenopathy.  Skin:    General: Skin is warm and dry.     Findings: No rash.  Neurological:     General: No focal deficit present.     Mental Status: He is alert and oriented to person, place, and time.     (all labs ordered are listed, but only abnormal results are displayed) Labs Reviewed  CBC WITH DIFFERENTIAL/PLATELET - Abnormal; Notable for the following components:      Result Value   WBC 14.8 (*)    Hemoglobin 12.5 (*)    Neutro Abs 12.0 (*)    Monocytes Absolute 1.5 (*)    All other components within normal limits  COMPREHENSIVE METABOLIC PANEL WITH GFR - Abnormal; Notable for the following components:   CO2 21 (*)    Glucose, Bld 119 (*)    All other components within normal limits  ETHANOL    EKG: EKG Interpretation Date/Time:  Thursday August 30 2024 20:04:26 EDT Ventricular Rate:  85 PR Interval:  154 QRS Duration:  87 QT Interval:  381 QTC Calculation: 453 R Axis:   69  Text  Interpretation: Sinus rhythm Probable left atrial enlargement Confirmed by Lenor Hollering [phone]) on 08/30/2024 8:08:17 PM  Radiology: CT Cervical Spine Wo Contrast Result Date: 08/30/2024 CLINICAL DATA:  Neck trauma (Age >= 65y), fell down steps. EXAM: CT CERVICAL SPINE WITHOUT CONTRAST TECHNIQUE: Multidetector CT imaging of the cervical spine was performed without intravenous contrast. Multiplanar CT image reconstructions were also generated. RADIATION DOSE REDUCTION: This exam was performed according to the departmental dose-optimization program which includes automated exposure control, adjustment of the mA and/or kV according to patient size and/or use of iterative reconstruction technique. COMPARISON:  08/24/2022 FINDINGS: Alignment: Normal Skull base and vertebrae: No acute fracture. No primary bone lesion or focal pathologic process. Soft tissues and spinal canal: No prevertebral fluid or swelling. No visible canal hematoma. Disc levels: Multilevel degenerative disc and facet disease. Partial fusion across the C5-6 and C6-7 disc spaces. Fusion across the left C6-7 facet Upper chest: 3 mm posterior left apical nodule, stable since 08/10/2022 chest CT Other: None IMPRESSION: Multilevel degenerative disc and facet disease. No acute bony abnormality. Electronically Signed   By: Franky Crease M.D.   On: 08/30/2024 21:30   DG Knee Complete 4 Views Left Result Date: 08/30/2024 EXAM: 4 OR MORE VIEW(S) XRAY OF THE LEFT KNEE 08/30/2024 07:10:00 PM COMPARISON: None available. CLINICAL HISTORY: fall, knee pain. Pt. Had fall earlier today. Best obtainable images due to pt ability. FINDINGS: BONES AND JOINTS: No acute fracture. No focal osseous lesion. No joint dislocation. No significant joint effusion. There is mild patellofemoral compartment joint space narrowing and osteophyte formation compatible with degenerative change. SOFT TISSUES: There is medial soft tissue swelling. IMPRESSION: 1. No acute fracture or  dislocation. 2. Medial soft tissue swelling. Electronically signed by: Greig Pique MD 08/30/2024 07:40 PM EDT RP Workstation: [id number]  CT Head Wo Contrast Result Date: 08/30/2024 CLINICAL DATA:  Status post fall. EXAM: CT HEAD WITHOUT CONTRAST TECHNIQUE: Contiguous axial images were obtained from the base of the skull through the vertex without intravenous contrast. RADIATION DOSE REDUCTION: This exam was performed according to the departmental dose-optimization program which includes automated exposure control, adjustment of the mA and/or kV according to patient size and/or use of iterative reconstruction technique. COMPARISON:  August 24, 2022 FINDINGS: Brain: There is generalized cerebral atrophy with widening of the extra-axial spaces and ventricular dilatation. There are areas of decreased attenuation within the white matter tracts of the supratentorial brain, consistent with microvascular disease changes. Vascular: No hyperdense vessel or unexpected calcification. Skull: Left pterional and left frontoparietal craniotomy defects are seen. Sinuses/Orbits: No acute finding. Other: None. IMPRESSION: 1. Generalized cerebral atrophy and microvascular disease changes of the supratentorial brain. 2. No acute intracranial abnormality. 3. 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 Electronically Signed   By: Suzen Dials M.D.   On: 08/30/2024 17:48   DG Hip Unilat W or Wo Pelvis 2-3 Views Left Result Date: 08/30/2024 EXAM: 2 or 3 VIEW(S) XRAY OF THE LEFT HIP 08/30/2024 04:06:00 PM COMPARISON: None available. CLINICAL HISTORY: fall. Per chart Pt arrives via EMS from. Pt was walking down steps when he became dizzy and fell down. Pt recalls the entire event. Pt has complaints generalized LEFT leg pain. FINDINGS: BONES AND JOINTS: No acute fracture or focal osseous lesion. The hip joint is maintained. Minimal osteophyte formation of left hip is noted. SOFT TISSUES: The soft tissues are  unremarkable. IMPRESSION: 1. No acute fracture or dislocation of the left hip or visualized pelvis. 2. Mild osteoarthrosis of the left hip. Electronically signed by: Lynwood Seip MD 08/30/2024 04:22 PM EDT RP Workstation: [id number]     Procedures   Medications Ordered in the ED  fentaNYL  (SUBLIMAZE ) injection 50 mcg (50 mcg Intravenous Given 08/30/24 1955)  sodium chloride  0.9 % bolus 1,000 mL (0 mLs Intravenous Stopped 08/30/24 2220)                                    Medical Decision Making Amount and/or Complexity of Data Reviewed Labs: ordered. Radiology: ordered.   This patient presents to the ED for concern of fall, leg pain, this involves an extensive number of treatment options, and is a complaint that carries with it a high risk of complications and morbidity.  I considered the following differential and admission for this acute, potentially life threatening condition.  The differential diagnosis includes syncope, seizure, vertigo, stroke, vasovagal episode, mechanical fall  MDM:    Patient is a 68 year old male who presents after a fall down a few stairs.  He is not sure what made him fall.  He initially had indicated that he had some dizziness although he denies that to me.  He does not have any focal neurologic deficits.  He did not have symptoms that sound more concerning for seizure activity.  He did not have any evident loss of consciousness.  He denies any recent alcohol use.  He says he has not had any alcohol in about a year.  His EKG does not show any arrhythmias.  His labs are nonconcerning.  He did not get a urine here in the ED but he is not complaining of any urinary symptoms.  His white count was mildly elevated but he does not have a fever.  No cough or cold symptoms.  No abdominal pain.  No urinary symptoms.  No evidence of skin infection/cellulitis.  Suspect may be reactive.  He had x-rays of the left hip and left knee which do not show any evidence of fractures.   CT scan of the head and cervical spine did not show any acute abnormality and he is able to ambulate without dizziness or ataxia.  He is able to put weight on the leg which would make an occult fracture less likely.  He was discharged home in good condition.  Return precautions were given.  He was encouraged to have close follow-up with his PCP.  (Labs, imaging, consults)  Labs: I Ordered, and personally interpreted labs.  The pertinent results include: Elevated WBC count, mildly elevated glucose  Imaging Studies ordered: I ordered imaging studies including CT head, CT cervical spine, left hip x-ray, left knee x-ray I independently visualized and interpreted imaging. I agree with the radiologist interpretation  Additional history obtained from chart.  External records from outside source obtained and reviewed including prior notes  Cardiac Monitoring: The patient was maintained on a cardiac monitor.  If on the cardiac monitor, I personally viewed and interpreted the cardiac monitored which showed an underlying rhythm of: Sinus rhythm  Reevaluation: After the interventions noted above, I reevaluated the patient and found that they have :improved  Social Determinants of Health:    Disposition: Discharged home  Co morbidities that complicate the patient evaluation  Past Medical History:  Diagnosis Date   Acute kidney injury 05/26/2018   Allergy    Anemia    Anxiety    Arthritis    LEGS,BACK   Cataract    BILATERAL   CKD (chronic kidney disease), stage III (HCC)    Depression    History of kidney stones    Hyperlipidemia    Hypertension, essential, benign 08/02/2012   Orthostatic hypotension 10/09/2013   OSA (obstructive sleep apnea) 10/01/2015   Severe OSA with an AHI of 86/hr now on BiPAP at 23/19cm H2O   Pneumonia    Rhabdomyolysis 08/24/2022   Seizures (HCC)    none for last 6-65months   Sleep apnea    Substance abuse (HCC)      Medicines Meds ordered this  encounter  Medications   fentaNYL  (SUBLIMAZE ) injection 50 mcg   sodium chloride  0.9 % bolus 1,000 mL    I have reviewed the patients home medicines and have made adjustments as needed  Problem List / ED Course: Problem List Items Addressed This Visit   None Visit Diagnoses       Fall, initial encounter    -  Primary     Left leg pain         Injury of head, initial encounter                    Final diagnoses:  Fall, initial encounter  Left leg pain  Injury of head, initial encounter    ED Discharge Orders     None          Lenor Hollering, MD 08/30/24 2254

## 2024-08-30 NOTE — Discharge Instructions (Addendum)
Make an appointment to have close follow-up with your primary care doctor.  Return to the emergency room if you have any worsening symptoms. 

## 2024-09-01 ENCOUNTER — Other Ambulatory Visit: Payer: Self-pay | Admitting: Family Medicine

## 2024-09-06 ENCOUNTER — Other Ambulatory Visit: Payer: Self-pay

## 2024-09-11 ENCOUNTER — Other Ambulatory Visit: Payer: Self-pay

## 2024-09-11 ENCOUNTER — Emergency Department (HOSPITAL_COMMUNITY)

## 2024-09-11 ENCOUNTER — Emergency Department (HOSPITAL_COMMUNITY): Admission: EM | Admit: 2024-09-11 | Discharge: 2024-09-11 | Disposition: A | Discharge status: Home / Self Care

## 2024-09-11 ENCOUNTER — Encounter (HOSPITAL_COMMUNITY): Payer: Self-pay | Admitting: Emergency Medicine

## 2024-09-11 DIAGNOSIS — D72829 Elevated white blood cell count, unspecified: Secondary | ICD-10-CM | POA: Insufficient documentation

## 2024-09-11 DIAGNOSIS — R1031 Right lower quadrant pain: Secondary | ICD-10-CM | POA: Diagnosis not present

## 2024-09-11 DIAGNOSIS — E876 Hypokalemia: Secondary | ICD-10-CM | POA: Diagnosis not present

## 2024-09-11 DIAGNOSIS — R1032 Left lower quadrant pain: Secondary | ICD-10-CM | POA: Insufficient documentation

## 2024-09-11 DIAGNOSIS — R197 Diarrhea, unspecified: Secondary | ICD-10-CM | POA: Diagnosis not present

## 2024-09-11 DIAGNOSIS — Z7982 Long term (current) use of aspirin: Secondary | ICD-10-CM | POA: Diagnosis not present

## 2024-09-11 DIAGNOSIS — R000 Tachycardia, unspecified: Secondary | ICD-10-CM | POA: Diagnosis not present

## 2024-09-11 DIAGNOSIS — R1110 Vomiting, unspecified: Secondary | ICD-10-CM

## 2024-09-11 DIAGNOSIS — R112 Nausea with vomiting, unspecified: Secondary | ICD-10-CM | POA: Diagnosis not present

## 2024-09-11 DIAGNOSIS — R109 Unspecified abdominal pain: Secondary | ICD-10-CM | POA: Diagnosis not present

## 2024-09-11 DIAGNOSIS — N200 Calculus of kidney: Secondary | ICD-10-CM | POA: Diagnosis not present

## 2024-09-11 DIAGNOSIS — N281 Cyst of kidney, acquired: Secondary | ICD-10-CM | POA: Diagnosis not present

## 2024-09-11 LAB — COMPREHENSIVE METABOLIC PANEL WITH GFR
ALT: 14 U/L (ref 0–44)
AST: 21 U/L (ref 15–41)
Albumin: 4.3 g/dL (ref 3.5–5.0)
Alkaline Phosphatase: 97 U/L (ref 38–126)
Anion gap: 14 (ref 5–15)
BUN: <5 mg/dL — ABNORMAL LOW (ref 8–23)
CO2: 20 mmol/L — ABNORMAL LOW (ref 22–32)
Calcium: 9.2 mg/dL (ref 8.9–10.3)
Chloride: 102 mmol/L (ref 98–111)
Creatinine, Ser: 1.08 mg/dL (ref 0.61–1.24)
GFR, Estimated: >60 mL/min (ref >60)
Glucose, Bld: 102 mg/dL — ABNORMAL HIGH (ref 70–99)
Potassium: 3.3 mmol/L — ABNORMAL LOW (ref 3.5–5.1)
Sodium: 136 mmol/L (ref 135–145)
Total Bilirubin: 0.4 mg/dL (ref 0.0–1.2)
Total Protein: 7.4 g/dL (ref 6.5–8.1)

## 2024-09-11 LAB — CBC
HCT: 37.9 % — ABNORMAL LOW (ref 39.0–52.0)
Hemoglobin: 12.8 g/dL — ABNORMAL LOW (ref 13.0–17.0)
MCH: 29.7 pg (ref 26.0–34.0)
MCHC: 33.8 g/dL (ref 30.0–36.0)
MCV: 87.9 fL (ref 80.0–100.0)
Platelets: 399 K/uL (ref 150–400)
RBC: 4.31 MIL/uL (ref 4.22–5.81)
RDW: 12.6 % (ref 11.5–15.5)
WBC: 11.5 K/uL — ABNORMAL HIGH (ref 4.0–10.5)
nRBC: 0 % (ref 0.0–0.2)

## 2024-09-11 LAB — URINALYSIS, ROUTINE W REFLEX MICROSCOPIC
Bacteria, UA: NONE SEEN
Bilirubin Urine: NEGATIVE
Glucose, UA: NEGATIVE mg/dL
Hgb urine dipstick: NEGATIVE
Ketones, ur: NEGATIVE mg/dL
Leukocytes,Ua: NEGATIVE
Nitrite: NEGATIVE
Protein, ur: NEGATIVE mg/dL
Specific Gravity, Urine: 1.006 (ref 1.005–1.030)
pH: 5 (ref 5.0–8.0)

## 2024-09-11 LAB — LIPASE, BLOOD: Lipase: 43 U/L (ref 11–51)

## 2024-09-11 MED ORDER — SODIUM CHLORIDE (PF) 0.9 % IJ SOLN
INTRAMUSCULAR | Status: AC
  Filled 2024-09-11: qty 50

## 2024-09-11 MED ORDER — ONDANSETRON HCL 4 MG/2ML IJ SOLN
4.0000 mg | Freq: Once | INTRAMUSCULAR | Status: AC
  Administered 2024-09-11: 4 mg via INTRAVENOUS
  Filled 2024-09-11: qty 2

## 2024-09-11 MED ORDER — LACTATED RINGERS IV BOLUS
1000.0000 mL | Freq: Once | INTRAVENOUS | Status: AC
  Administered 2024-09-11: 1000 mL via INTRAVENOUS

## 2024-09-11 MED ORDER — ONDANSETRON HCL 4 MG PO TABS: 4.0000 mg | ORAL_TABLET | Freq: Three times a day (TID) | ORAL | 0 refills | Status: AC | PRN

## 2024-09-11 MED ORDER — IOHEXOL 300 MG/ML  SOLN
100.0000 mL | Freq: Once | INTRAMUSCULAR | Status: AC | PRN
  Administered 2024-09-11: 100 mL via INTRAVENOUS

## 2024-09-11 MED ORDER — POTASSIUM CHLORIDE CRYS ER 20 MEQ PO TBCR
40.0000 meq | EXTENDED_RELEASE_TABLET | Freq: Once | ORAL | Status: AC
Start: 2024-09-11 — End: 2024-09-11
  Administered 2024-09-11: 40 meq via ORAL
  Filled 2024-09-11: qty 2

## 2024-09-11 NOTE — ED Triage Notes (Signed)
 Pt reports abdominal pain, vomiting, and weakness. Denies fevers.

## 2024-09-11 NOTE — ED Provider Notes (Signed)
 [place] EMERGENCY DEPARTMENT AT [facility] Provider Note   CSN: [id number] Arrival date & time: 09/11/24  1312     Patient presents with: Abdominal Pain   ALANDO COLLERAN is a 68 y.o. male.  {Add pertinent medical, surgical, social history, OB history to HPI:4178} 68 year old male presents for evaluation of abdominal pain.  States he started having nausea vomiting and diarrhea today and crampy bilateral abdominal pain.  States he feels very weak and dehydrated.  Denies any other symptoms or concerns.   Abdominal Pain Associated symptoms: diarrhea, nausea and vomiting   Associated symptoms: no chest pain, no chills, no cough, no dysuria, no fever, no hematuria, no shortness of breath and no sore throat        Prior to Admission medications   Medication Sig Start Date End Date Taking? Authorizing Provider  Aromatic Inhalants (VICKS VAPOR INHALER IN) Place 1 Inhalation into both nostrils as needed (for congestion).    [provider]  aspirin  81 MG EC tablet Take 81 mg by mouth in the morning.    [provider]  atorvastatin  (LIPITOR) 40 MG tablet TAKE 1 TABLET BY MOUTH DAILY 04/23/24   Okey Vina GAILS, MD  benztropine  (COGENTIN ) 1 MG tablet Take 1 mg by mouth at bedtime.    [provider]  bismuth subsalicylate (PEPTO BISMOL) 262 MG/15ML suspension Take 30 mLs by mouth every 6 (six) hours as needed for diarrhea or loose stools.    [provider]  Boswellia-Glucosamine-Vit D (OSTEO BI-FLEX ONE PER DAY PO) Take 1 tablet by mouth every other day.    [provider]  busPIRone  (BUSPAR ) 5 MG tablet Take 5 mg by mouth See admin instructions. Take 5 mg by mouth in the morning and afternoon 07/06/17   [provider]  cetirizine  (ZYRTEC ) 10 MG tablet Take 10 mg by mouth daily.    [provider]  citalopram  (CELEXA ) 20 MG tablet Take 20 mg by mouth in the morning.    [provider]  feeding supplement (BOOST  HIGH PROTEIN) LIQD Take 1 Container by mouth See admin instructions. Drink 1 container of CHOCOLATE Boost High Calorie formula liquid by mouth one to three times a day between meals as needed for nutritional supplementation    [provider]  finasteride  (PROSCAR ) 5 MG tablet TAKE 1 TABLET BY MOUTH DAILY Patient taking differently: Take 5 mg by mouth in the morning. 01/31/24   Shona Layman BROCKS, MD  folic acid  (FOLVITE ) 1 MG tablet Take 1 tablet (1 mg total) by mouth daily. 08/02/24   Sherrill Cable Latif, DO  Lacosamide  (VIMPAT ) 150 MG TABS Take 1 tablet (150 mg total) by mouth in the morning and at bedtime. 08/20/24   Gayland Lauraine PARAS, NP  levETIRAcetam  (KEPPRA ) 750 MG tablet Take 2 tablets (1,500 mg total) by mouth 2 (two) times daily. Patient taking differently: Take 1,500 mg by mouth in the morning and at bedtime. 02/01/24   Gayland Lauraine PARAS, NP  midodrine  (PROAMATINE ) 5 MG tablet Take 5-10 mg by mouth See admin instructions. Take 10 mg by mouth in the morning and 5 mg at 2 PM- WITH MEALS 07/29/24   [provider]  Multiple Vitamin (MULTIVITAMIN) tablet Take 1 tablet by mouth daily with breakfast.    [provider]  ondansetron  (ZOFRAN -ODT) 4 MG disintegrating tablet Take 1 tablet (4 mg total) by mouth every 8 (eight) hours as needed for nausea or vomiting. 08/17/24   Diona Perkins, MD  PEPTO BISMOL ULTRA 525 MG TABS Take 525 mg by mouth every 6 (six) hours as needed (for diarrhea- if not taking the liquid version).    [provider]  potassium chloride  (KLOR-CON ) 10 MEQ tablet Take 10 mEq by mouth daily. 03/24/23   [provider]  pregabalin  (LYRICA ) 75 MG capsule TAKE 1 CAPSULE BY MOUTH TWICE  DAILY Patient taking differently: Take 75 mg by mouth in the morning and at bedtime. 03/19/24   Gayland Lauraine PARAS, NP  risperiDONE  (RISPERDAL ) 2 MG tablet Take 2 mg by mouth at bedtime.     [provider]  tamsulosin  (FLOMAX ) 0.4 MG CAPS capsule Take 0.4 mg by mouth  at bedtime. 08/20/20   [provider]  thiamine  250 MG tablet Take 250 mg by mouth daily.    [provider]  traZODone  (DESYREL ) 100 MG tablet Take 300 mg by mouth at bedtime. 09/24/15   [provider]  TYLENOL  500 MG tablet Take 500 mg by mouth every 8 (eight) hours as needed (for pain- WHEN NOT TAKING THE 650 MG STRENGTH).    [provider]  TYLENOL  8 HOUR ARTHRITIS PAIN 650 MG CR tablet Take 650 mg by mouth every 8 (eight) hours as needed for pain (WHEN NOT TAKING THE 500 MG STRENGTH).    [provider]    Allergies: Bee venom, Cymbalta  [duloxetine  hcl], Erythromycin, Hctz [hydrochlorothiazide ], and Inderal  [propranolol ]    Review of Systems  Constitutional:  Negative for chills and fever.  HENT:  Negative for ear pain and sore throat.   Eyes:  Negative for pain and visual disturbance.  Respiratory:  Negative for cough and shortness of breath.   Cardiovascular:  Negative for chest pain and palpitations.  Gastrointestinal:  Positive for abdominal pain, diarrhea, nausea and vomiting.  Genitourinary:  Negative for dysuria and hematuria.  Musculoskeletal:  Negative for arthralgias and back pain.  Skin:  Negative for color change and rash.  Neurological:  Negative for seizures and syncope.  All other systems reviewed and are negative.   Updated Vital Signs BP (!) 142/68 (BP Location: Left Arm)   Pulse (!) 108   Temp 98.6 F (37 C) (Oral)   Resp 16   SpO2 99%   Physical Exam Vitals and nursing note reviewed.  Constitutional:      General: He is not in acute distress.    Appearance: He is well-developed. He is not ill-appearing.  HENT:     Head: Normocephalic and atraumatic.  Eyes:     Conjunctiva/sclera: Conjunctivae normal.  Cardiovascular:     Rate and Rhythm: Normal rate and regular rhythm.     Heart sounds: No murmur heard. Pulmonary:     Effort: Pulmonary effort is normal. No respiratory distress.     Breath sounds: Normal  breath sounds.  Abdominal:     Palpations: Abdomen is soft.     Tenderness: There is abdominal tenderness in the right lower quadrant and left lower quadrant.  Musculoskeletal:        General: No swelling.     Cervical back: Neck supple.  Skin:    General: Skin is warm and dry.     Capillary Refill: Capillary refill takes less than 2 seconds.  Neurological:     Mental Status: He is alert.  Psychiatric:        Mood and Affect: Mood normal.     (all labs ordered are listed, but only abnormal results are displayed) Labs Reviewed  COMPREHENSIVE METABOLIC  PANEL WITH GFR - Abnormal; Notable for the following components:      Result Value   Potassium 3.3 (*)    CO2 20 (*)    Glucose, Bld 102 (*)    BUN <5 (*)    All other components within normal limits  CBC - Abnormal; Notable for the following components:   WBC 11.5 (*)    Hemoglobin 12.8 (*)    HCT 37.9 (*)    All other components within normal limits  LIPASE, BLOOD  URINALYSIS, ROUTINE W REFLEX MICROSCOPIC    EKG: EKG Interpretation Date/Time:  Tuesday September 11 2024 13:23:09 EDT Ventricular Rate:  108 PR Interval:  146 QRS Duration:  87 QT Interval:  351 QTC Calculation: 471 R Axis:   63  Text Interpretation: Sinus tachycardia Borderline T abnormalities, inferior leads Compared with prior EKG from 08/30/2024 Confirmed by Gennaro Bouchard ([id number]) on 09/11/2024 1:25:45 PM  Radiology: No results found.  {Document cardiac monitor, telemetry assessment procedure when appropriate:32947} Procedures   Medications Ordered in the ED  ondansetron  (ZOFRAN ) injection 4 mg (has no administration in time range)  lactated ringers  bolus 1,000 mL (has no administration in time range)  potassium chloride  SA (KLOR-CON  M) CR tablet 40 mEq (has no administration in time range)      {Click here for ABCD2, HEART and other calculators REFRESH Note before signing:1}                              Medical Decision Making Amount and/or  Complexity of Data Reviewed Labs: ordered. Radiology: ordered.  Risk Prescription drug management.   ***  {Document critical care time when appropriate  Document review of labs and clinical decision tools ie CHADS2VASC2, etc  Document your independent review of radiology images and any outside records  Document your discussion with family members, caretakers and with consultants  Document social determinants of health affecting pt's care  Document your decision making why or why not admission, treatments were needed:32947:::1}   Final diagnoses:  None    ED Discharge Orders     None

## 2024-09-11 NOTE — Discharge Instructions (Addendum)
 You can use Zofran  as needed for nausea and vomiting.  Pick up Imodium, it is over-the-counter, take it as needed for diarrhea.  Increase your fluid intake and eat a bland diet for the next 24 hours.

## 2024-09-18 ENCOUNTER — Other Ambulatory Visit: Payer: Self-pay | Admitting: Urology

## 2024-09-18 ENCOUNTER — Other Ambulatory Visit: Payer: Self-pay | Admitting: Family Medicine

## 2024-09-18 ENCOUNTER — Telehealth: Payer: Self-pay

## 2024-09-18 ENCOUNTER — Telehealth: Payer: Self-pay | Admitting: Neurology

## 2024-09-18 DIAGNOSIS — N401 Enlarged prostate with lower urinary tract symptoms: Secondary | ICD-10-CM

## 2024-09-18 MED ORDER — FINASTERIDE 5 MG PO TABS: 5.0000 mg | ORAL_TABLET | Freq: Every morning | ORAL | 2 refills | Status: DC

## 2024-09-18 NOTE — Telephone Encounter (Signed)
 Last seen on 02/01/24 No follow up scheduled    Dispensed Days Supply Quantity Provider Pharmacy  PREGABALIN   75 MG CAPS 06/08/2024 90 180 capsule Gayland Lauraine PARAS, NP [facility], [facility]      Rx pending to be signed

## 2024-09-18 NOTE — Telephone Encounter (Signed)
 Pharmacy sent a refill of Finasteride . Pt last saw Dr. Shona 02/2024 and has a 66yr f/u with you 02/2025. I see where 100 day supply was sent in, March 2025. Wasn't sure if that was 100 days total or 100 days with 2 refills. Please advise.

## 2024-09-19 NOTE — Telephone Encounter (Signed)
 I will refill Lyrica . He needs follow up visit. I saw in March, recommended 6 months with Dr. Gregg. Patient cancelled his appointment in Sept with Camara. Can we get him scheduled? Thanks   Meds ordered this encounter  Medications   pregabalin  (LYRICA ) 75 MG capsule    Sig: TAKE 1 CAPSULE BY MOUTH TWICE  DAILY    Dispense:  180 capsule    Refill:  0    Needs to schedule follow up

## 2024-09-20 NOTE — Telephone Encounter (Signed)
 Chart reviewed. Appears EDP planned for zofran  for patient upon ED discharge 10/28. Rx refilled.

## 2024-09-29 ENCOUNTER — Other Ambulatory Visit: Payer: Self-pay | Admitting: Neurology

## 2024-10-01 ENCOUNTER — Other Ambulatory Visit: Payer: Self-pay | Admitting: Neurology

## 2024-10-01 NOTE — Telephone Encounter (Signed)
 Requested Prescriptions   Pending Prescriptions Disp Refills   Lacosamide  (VIMPAT ) 150 MG TABS 180 tablet 1    Sig: Take 1 tablet (150 mg total) by mouth in the morning and at bedtime.   Last seen 02/01/24 Next appt 11/21/24 Dispenses   Dispensed Days Supply Quantity Provider Pharmacy  LACOSAMIDE  150MG     TAB 08/20/2024 30 60 each Gayland Lauraine PARAS, NP [facility] [phone] ...  LACOSAMIDE   150 MG TABS 07/27/2024 30 60 tablet Gayland Lauraine PARAS, NP [facility]  LACOSAMIDE   150 MG TABS 06/23/2024 30 60 tablet Gayland Lauraine PARAS, NP [facility]  LACOSAMIDE   150 MG TABS 05/19/2024 30 60 tablet Gayland Lauraine PARAS, NP [facility]  LACOSAMIDE   150 MG TABS 04/20/2024 30 60 tablet Gayland Lauraine PARAS, NP [facility]  LACOSAMIDE   150 MG TABS 02/29/2024 30 60 tablet Gayland Lauraine PARAS, NP [facility]  LACOSAMIDE   150 MG TABS 02/01/2024 30 60 tablet Gayland Lauraine PARAS, NP [facility]  LACOSAMIDE  100MG     TAB 01/13/2024 30 60 each Gregg Lek, MD [facility] [phone] ...  LACOSAMIDE  100MG     TAB 12/12/2023 30 60 each Gregg Lek, MD [facility] [phone] ...  LACOSAMIDE  100MG     TAB 11/10/2023 30 60 each Gregg Lek, MD [facility] [phone] ...  LACOSAMIDE  100MG     TAB 10/14/2023 30 60 each Gregg Lek, MD [facility] [phone] .SABRASABRA

## 2024-10-01 NOTE — Telephone Encounter (Signed)
 Requested Prescriptions   Pending Prescriptions Disp Refills   pregabalin  (LYRICA ) 75 MG capsule [Pharmacy Med Name: Pregabalin  75 MG Oral Capsule] 180 capsule     Sig: TAKE 1 CAPSULE BY MOUTH TWICE  DAILY   levETIRAcetam  (KEPPRA ) 750 MG tablet [Pharmacy Med Name: levETIRAcetam  750 MG Oral Tablet] 400 tablet 2    Sig: TAKE 2 TABLETS BY MOUTH TWICE  DAILY   Refused Prescriptions Disp Refills   Lacosamide  150 MG TABS [Pharmacy Med Name: LACOSAMIDE   150MG   TAB] 60 tablet     Sig: TAKE 1 TABLET BY MOUTH IN THE  MORNING AND AT BEDTIME    Refused By: ONEITA HOIST E    Reason for Refusal: Request already responded to by other means (e.g. phone or fax)   Last seen 02/01/24 Next appt 11/21/24   Dispenses   Dispensed Days Supply Quantity Provider Pharmacy  PREGABALIN   75 MG CAPS 09/19/2024 90 180 capsule Gayland Lauraine PARAS, NP [facility]  PREGABALIN   75 MG CAPS 06/08/2024 90 180 capsule Gayland Lauraine PARAS, NP [facility]  PREGABALIN   75 MG CAPS 03/19/2024 90 180 capsule Gayland Lauraine PARAS, NP [facility]  PREGABALIN   75 MG CAPS 12/08/2023 90 180 capsule  [facility]

## 2024-10-01 NOTE — Telephone Encounter (Signed)
 Patient request refill for Lacosamide  (VIMPAT ) 150 MG TABS sent to  [facility] [phone]

## 2024-10-02 NOTE — Progress Notes (Signed)
 JAHAN FRIEDLANDER                                          MRN: [id number]   10/02/2024   The VBCI Quality Team Specialist reviewed this patient medical record for the purposes of chart review for care gap closure. The following were reviewed: chart review for care gap closure-glycemic status assessment.    VBCI Quality Team

## 2024-10-22 NOTE — Progress Notes (Signed)
 Jose Li                                          MRN: [id number]   10/22/2024   The VBCI Quality Team Specialist reviewed this patient medical record for the purposes of chart review for care gap closure. The following were reviewed: chart review for care gap closure-glycemic status assessment and kidney health evaluation for diabetes:eGFR  and uACR.    VBCI Quality Team

## 2024-10-27 ENCOUNTER — Other Ambulatory Visit: Payer: Self-pay | Admitting: Neurology

## 2024-10-27 MED ORDER — LACOSAMIDE 150 MG PO TABS: 150.0000 mg | ORAL_TABLET | Freq: Two times a day (BID) | ORAL | 5 refills | Status: AC

## 2024-10-31 ENCOUNTER — Other Ambulatory Visit: Payer: Self-pay

## 2024-10-31 DIAGNOSIS — N401 Enlarged prostate with lower urinary tract symptoms: Secondary | ICD-10-CM

## 2024-10-31 MED ORDER — FINASTERIDE 5 MG PO TABS: 5.0000 mg | ORAL_TABLET | Freq: Every morning | ORAL | 1 refills | Status: AC

## 2024-11-21 ENCOUNTER — Ambulatory Visit: Admitting: Neurology

## 2024-11-25 ENCOUNTER — Other Ambulatory Visit: Payer: Self-pay

## 2024-11-25 ENCOUNTER — Inpatient Hospital Stay (HOSPITAL_COMMUNITY)
Admission: EM | Admit: 2024-11-25 | Discharge: 2024-11-29 | DRG: 603 | Disposition: A | Discharge status: Home / Self Care | Attending: Internal Medicine | Admitting: Internal Medicine

## 2024-11-25 ENCOUNTER — Encounter (HOSPITAL_COMMUNITY): Payer: Self-pay

## 2024-11-25 ENCOUNTER — Emergency Department (HOSPITAL_COMMUNITY)

## 2024-11-25 DIAGNOSIS — F419 Anxiety disorder, unspecified: Secondary | ICD-10-CM | POA: Diagnosis present

## 2024-11-25 DIAGNOSIS — R7303 Prediabetes: Secondary | ICD-10-CM | POA: Diagnosis present

## 2024-11-25 DIAGNOSIS — W5501XA Bitten by cat, initial encounter: Principal | ICD-10-CM

## 2024-11-25 DIAGNOSIS — G40909 Epilepsy, unspecified, not intractable, without status epilepticus: Secondary | ICD-10-CM | POA: Diagnosis present

## 2024-11-25 DIAGNOSIS — I129 Hypertensive chronic kidney disease with stage 1 through stage 4 chronic kidney disease, or unspecified chronic kidney disease: Secondary | ICD-10-CM | POA: Diagnosis present

## 2024-11-25 DIAGNOSIS — S61451A Open bite of right hand, initial encounter: Secondary | ICD-10-CM | POA: Diagnosis present

## 2024-11-25 DIAGNOSIS — L03113 Cellulitis of right upper limb: Principal | ICD-10-CM | POA: Diagnosis present

## 2024-11-25 DIAGNOSIS — Z87438 Personal history of other diseases of male genital organs: Secondary | ICD-10-CM | POA: Diagnosis present

## 2024-11-25 DIAGNOSIS — E876 Hypokalemia: Secondary | ICD-10-CM | POA: Diagnosis present

## 2024-11-25 DIAGNOSIS — N183 Chronic kidney disease, stage 3 unspecified: Secondary | ICD-10-CM | POA: Diagnosis present

## 2024-11-25 DIAGNOSIS — I10 Essential (primary) hypertension: Secondary | ICD-10-CM | POA: Diagnosis present

## 2024-11-25 DIAGNOSIS — F319 Bipolar disorder, unspecified: Secondary | ICD-10-CM | POA: Diagnosis present

## 2024-11-25 DIAGNOSIS — Z8249 Family history of ischemic heart disease and other diseases of the circulatory system: Secondary | ICD-10-CM

## 2024-11-25 DIAGNOSIS — N400 Enlarged prostate without lower urinary tract symptoms: Secondary | ICD-10-CM | POA: Diagnosis present

## 2024-11-25 DIAGNOSIS — Z7982 Long term (current) use of aspirin: Secondary | ICD-10-CM

## 2024-11-25 DIAGNOSIS — Z96641 Presence of right artificial hip joint: Secondary | ICD-10-CM | POA: Diagnosis present

## 2024-11-25 DIAGNOSIS — Z87891 Personal history of nicotine dependence: Secondary | ICD-10-CM

## 2024-11-25 DIAGNOSIS — E785 Hyperlipidemia, unspecified: Secondary | ICD-10-CM | POA: Diagnosis present

## 2024-11-25 DIAGNOSIS — T148XXA Other injury of unspecified body region, initial encounter: Secondary | ICD-10-CM | POA: Diagnosis present

## 2024-11-25 DIAGNOSIS — G4733 Obstructive sleep apnea (adult) (pediatric): Secondary | ICD-10-CM | POA: Diagnosis present

## 2024-11-25 LAB — CBC WITH DIFFERENTIAL/PLATELET
Abs Immature Granulocytes: 0.02 K/uL (ref 0.00–0.07)
Basophils Absolute: 0 K/uL (ref 0.0–0.1)
Basophils Relative: 0 %
Eosinophils Absolute: 0.1 K/uL (ref 0.0–0.5)
Eosinophils Relative: 1 %
HCT: 32.2 % — ABNORMAL LOW (ref 39.0–52.0)
Hemoglobin: 10.5 g/dL — ABNORMAL LOW (ref 13.0–17.0)
Immature Granulocytes: 0 %
Lymphocytes Relative: 16 %
Lymphs Abs: 1.4 K/uL (ref 0.7–4.0)
MCH: 28.5 pg (ref 26.0–34.0)
MCHC: 32.6 g/dL (ref 30.0–36.0)
MCV: 87.3 fL (ref 80.0–100.0)
Monocytes Absolute: 1.1 K/uL — ABNORMAL HIGH (ref 0.1–1.0)
Monocytes Relative: 13 %
Neutro Abs: 5.8 K/uL (ref 1.7–7.7)
Neutrophils Relative %: 70 %
Platelets: 244 K/uL (ref 150–400)
RBC: 3.69 MIL/uL — ABNORMAL LOW (ref 4.22–5.81)
RDW: 13.7 % (ref 11.5–15.5)
WBC: 8.4 K/uL (ref 4.0–10.5)
nRBC: 0 % (ref 0.0–0.2)

## 2024-11-25 LAB — BASIC METABOLIC PANEL WITH GFR
Anion gap: 11 (ref 5–15)
BUN: 9 mg/dL (ref 8–23)
CO2: 21 mmol/L — ABNORMAL LOW (ref 22–32)
Calcium: 8.7 mg/dL — ABNORMAL LOW (ref 8.9–10.3)
Chloride: 109 mmol/L (ref 98–111)
Creatinine, Ser: 0.88 mg/dL (ref 0.61–1.24)
GFR, Estimated: >60 mL/min
Glucose, Bld: 114 mg/dL — ABNORMAL HIGH (ref 70–99)
Potassium: 2.8 mmol/L — ABNORMAL LOW (ref 3.5–5.1)
Sodium: 141 mmol/L (ref 135–145)

## 2024-11-25 LAB — PHOSPHORUS: Phosphorus: 3.2 mg/dL (ref 2.5–4.6)

## 2024-11-25 LAB — MAGNESIUM: Magnesium: 1.8 mg/dL (ref 1.7–2.4)

## 2024-11-25 LAB — POTASSIUM: Potassium: 3.3 mmol/L — ABNORMAL LOW (ref 3.5–5.1)

## 2024-11-25 MED ORDER — OXYCODONE HCL 5 MG PO TABS
5.0000 mg | ORAL_TABLET | ORAL | Status: DC | PRN
  Administered 2024-11-25 – 2024-11-28 (×10): 5 mg via ORAL
  Filled 2024-11-25 (×10): qty 1

## 2024-11-25 MED ORDER — FOLIC ACID 1 MG PO TABS: 1.0000 mg | ORAL_TABLET | Freq: Every day | ORAL | Status: DC

## 2024-11-25 MED ORDER — POTASSIUM CHLORIDE CRYS ER 20 MEQ PO TBCR
20.0000 meq | EXTENDED_RELEASE_TABLET | Freq: Once | ORAL | Status: AC
  Administered 2024-11-25: 20 meq via ORAL
  Filled 2024-11-25: qty 1

## 2024-11-25 MED ORDER — SODIUM CHLORIDE 0.9 % IV SOLN: INTRAVENOUS | Status: AC

## 2024-11-25 MED ORDER — TRAZODONE HCL 100 MG PO TABS
200.0000 mg | ORAL_TABLET | Freq: Every day | ORAL | Status: DC
  Administered 2024-11-25 – 2024-11-28 (×4): 200 mg via ORAL
  Filled 2024-11-25 (×4): qty 2

## 2024-11-25 MED ORDER — BENZTROPINE MESYLATE 1 MG PO TABS
1.0000 mg | ORAL_TABLET | Freq: Every day | ORAL | Status: DC
  Administered 2024-11-25 – 2024-11-28 (×4): 1 mg via ORAL
  Filled 2024-11-25 (×4): qty 1

## 2024-11-25 MED ORDER — BUSPIRONE HCL 10 MG PO TABS
5.0000 mg | ORAL_TABLET | Freq: Two times a day (BID) | ORAL | Status: DC
  Administered 2024-11-25 – 2024-11-29 (×9): 5 mg via ORAL
  Filled 2024-11-25 (×9): qty 1

## 2024-11-25 MED ORDER — RISPERIDONE 2 MG PO TABS
2.0000 mg | ORAL_TABLET | Freq: Every day | ORAL | Status: DC
  Administered 2024-11-25 – 2024-11-28 (×4): 2 mg via ORAL
  Filled 2024-11-25 (×4): qty 1

## 2024-11-25 MED ORDER — SODIUM CHLORIDE 0.9% FLUSH
3.0000 mL | Freq: Two times a day (BID) | INTRAVENOUS | Status: DC
  Administered 2024-11-25 – 2024-11-29 (×6): 3 mL via INTRAVENOUS

## 2024-11-25 MED ORDER — ONDANSETRON HCL 4 MG PO TABS
4.0000 mg | ORAL_TABLET | Freq: Four times a day (QID) | ORAL | Status: DC | PRN
  Administered 2024-11-27 – 2024-11-28 (×2): 4 mg via ORAL
  Filled 2024-11-25 (×2): qty 1

## 2024-11-25 MED ORDER — VANCOMYCIN HCL 1500 MG/300ML IV SOLN
1500.0000 mg | Freq: Once | INTRAVENOUS | Status: AC
  Administered 2024-11-25: 1500 mg via INTRAVENOUS
  Filled 2024-11-25: qty 300

## 2024-11-25 MED ORDER — SODIUM CHLORIDE 0.9 % IV SOLN
3.0000 g | Freq: Four times a day (QID) | INTRAVENOUS | Status: DC
  Administered 2024-11-25 – 2024-11-29 (×17): 3 g via INTRAVENOUS
  Filled 2024-11-25 (×19): qty 8

## 2024-11-25 MED ORDER — PREGABALIN 50 MG PO CAPS
75.0000 mg | ORAL_CAPSULE | Freq: Two times a day (BID) | ORAL | Status: DC
  Administered 2024-11-25 – 2024-11-29 (×9): 75 mg via ORAL
  Filled 2024-11-25 (×9): qty 1

## 2024-11-25 MED ORDER — FINASTERIDE 5 MG PO TABS
5.0000 mg | ORAL_TABLET | Freq: Every day | ORAL | Status: DC
  Administered 2024-11-25 – 2024-11-29 (×5): 5 mg via ORAL
  Filled 2024-11-25 (×5): qty 1

## 2024-11-25 MED ORDER — THIAMINE MONONITRATE 100 MG PO TABS
250.0000 mg | ORAL_TABLET | Freq: Every day | ORAL | Status: DC
  Administered 2024-11-25 – 2024-11-29 (×5): 250 mg via ORAL
  Filled 2024-11-25 (×5): qty 3

## 2024-11-25 MED ORDER — VANCOMYCIN HCL 750 MG/150ML IV SOLN
750.0000 mg | Freq: Three times a day (TID) | INTRAVENOUS | Status: DC
  Administered 2024-11-25 – 2024-11-26 (×3): 750 mg via INTRAVENOUS
  Filled 2024-11-25 (×4): qty 150

## 2024-11-25 MED ORDER — LEVETIRACETAM 500 MG PO TABS
1500.0000 mg | ORAL_TABLET | Freq: Two times a day (BID) | ORAL | Status: DC
  Administered 2024-11-25 – 2024-11-29 (×9): 1500 mg via ORAL
  Filled 2024-11-25 (×9): qty 3

## 2024-11-25 MED ORDER — HEPARIN SODIUM (PORCINE) 5000 UNIT/ML IJ SOLN
5000.0000 [IU] | Freq: Three times a day (TID) | INTRAMUSCULAR | Status: DC
  Administered 2024-11-25 – 2024-11-29 (×13): 5000 [IU] via SUBCUTANEOUS
  Filled 2024-11-25 (×12): qty 1

## 2024-11-25 MED ORDER — TAMSULOSIN HCL 0.4 MG PO CAPS
0.4000 mg | ORAL_CAPSULE | Freq: Every day | ORAL | Status: DC
  Administered 2024-11-25 – 2024-11-28 (×4): 0.4 mg via ORAL
  Filled 2024-11-25 (×5): qty 1

## 2024-11-25 MED ORDER — BISACODYL 5 MG PO TBEC: 5.0000 mg | DELAYED_RELEASE_TABLET | Freq: Every day | ORAL | Status: DC | PRN

## 2024-11-25 MED ORDER — ONDANSETRON HCL 4 MG/2ML IJ SOLN: 4.0000 mg | Freq: Four times a day (QID) | INTRAMUSCULAR | Status: DC | PRN

## 2024-11-25 MED ORDER — HYDRALAZINE HCL 20 MG/ML IJ SOLN: 10.0000 mg | INTRAMUSCULAR | Status: DC | PRN

## 2024-11-25 MED ORDER — ACETAMINOPHEN 650 MG RE SUPP: 650.0000 mg | Freq: Four times a day (QID) | RECTAL | Status: DC | PRN

## 2024-11-25 MED ORDER — ATORVASTATIN CALCIUM 40 MG PO TABS
40.0000 mg | ORAL_TABLET | Freq: Every day | ORAL | Status: DC
  Administered 2024-11-25 – 2024-11-29 (×5): 40 mg via ORAL
  Filled 2024-11-25 (×5): qty 1

## 2024-11-25 MED ORDER — FLEET ENEMA RE ENEM: 1.0000 | ENEMA | Freq: Once | RECTAL | Status: DC | PRN

## 2024-11-25 MED ORDER — ASPIRIN 81 MG PO TBEC
81.0000 mg | DELAYED_RELEASE_TABLET | Freq: Every day | ORAL | Status: DC
  Administered 2024-11-25 – 2024-11-29 (×5): 81 mg via ORAL
  Filled 2024-11-25 (×5): qty 1

## 2024-11-25 MED ORDER — POTASSIUM CHLORIDE 10 MEQ/100ML IV SOLN
10.0000 meq | INTRAVENOUS | Status: AC
  Administered 2024-11-25 (×2): 10 meq via INTRAVENOUS
  Filled 2024-11-25 (×2): qty 100

## 2024-11-25 MED ORDER — ADULT MULTIVITAMIN W/MINERALS CH
1.0000 | ORAL_TABLET | Freq: Every day | ORAL | Status: DC
  Administered 2024-11-26 – 2024-11-29 (×4): 1 via ORAL
  Filled 2024-11-25 (×4): qty 1

## 2024-11-25 MED ORDER — SODIUM CHLORIDE 0.9% FLUSH
3.0000 mL | Freq: Two times a day (BID) | INTRAVENOUS | Status: DC
  Administered 2024-11-25 – 2024-11-28 (×6): 3 mL via INTRAVENOUS

## 2024-11-25 MED ORDER — LACOSAMIDE 50 MG PO TABS
150.0000 mg | ORAL_TABLET | Freq: Two times a day (BID) | ORAL | Status: DC
  Administered 2024-11-25 – 2024-11-29 (×9): 150 mg via ORAL
  Filled 2024-11-25 (×9): qty 3

## 2024-11-25 MED ORDER — IPRATROPIUM BROMIDE 0.02 % IN SOLN: 0.5000 mg | Freq: Four times a day (QID) | RESPIRATORY_TRACT | Status: DC | PRN

## 2024-11-25 MED ORDER — RISPERIDONE 0.25 MG PO TABS
2.0000 mg | ORAL_TABLET | Freq: Every day | ORAL | Status: DC
  Filled 2024-11-25: qty 8

## 2024-11-25 MED ORDER — CITALOPRAM HYDROBROMIDE 20 MG PO TABS
20.0000 mg | ORAL_TABLET | Freq: Every day | ORAL | Status: DC
  Administered 2024-11-25 – 2024-11-29 (×5): 20 mg via ORAL
  Filled 2024-11-25 (×5): qty 1

## 2024-11-25 MED ORDER — POTASSIUM CHLORIDE 10 MEQ/100ML IV SOLN: 10.0000 meq | INTRAVENOUS | Status: DC

## 2024-11-25 MED ORDER — ACETAMINOPHEN 325 MG PO TABS
650.0000 mg | ORAL_TABLET | Freq: Four times a day (QID) | ORAL | Status: DC | PRN
  Administered 2024-11-25 – 2024-11-29 (×9): 650 mg via ORAL
  Filled 2024-11-25 (×10): qty 2

## 2024-11-25 MED ORDER — TRAZODONE HCL 100 MG PO TABS: 200.0000 mg | ORAL_TABLET | Freq: Every evening | ORAL | Status: DC | PRN

## 2024-11-25 MED ORDER — HYDROMORPHONE HCL 1 MG/ML IJ SOLN
0.5000 mg | INTRAMUSCULAR | Status: DC | PRN
  Administered 2024-11-26 – 2024-11-27 (×4): 1 mg via INTRAVENOUS
  Filled 2024-11-25 (×4): qty 1

## 2024-11-25 MED ORDER — SENNOSIDES-DOCUSATE SODIUM 8.6-50 MG PO TABS: 1.0000 | ORAL_TABLET | Freq: Every evening | ORAL | Status: DC | PRN

## 2024-11-25 MED ORDER — TRAZODONE HCL 50 MG PO TABS: 25.0000 mg | ORAL_TABLET | Freq: Every evening | ORAL | Status: DC | PRN

## 2024-11-25 NOTE — Assessment & Plan Note (Signed)
 Monitoring and replating orally

## 2024-11-25 NOTE — Assessment & Plan Note (Signed)
 Mood remains stable, and on multiple medication Need to be verified: Currently listed: BuSpar , Celexa , Lyrica , risperidone ,

## 2024-11-25 NOTE — Assessment & Plan Note (Signed)
 Status post right hand cat bite -Edema erythema subjective fever - Follow-up with blood cultures - Patient has been initiated on broad-spectrum antibiotics of vancomycin  and and Unasyn 

## 2024-11-25 NOTE — ED Provider Notes (Signed)
 " [facility]-EMERGENCY DEPT [facility] Emergency Department Provider Note MRN:  [id number]  Arrival date & time: 11/25/2024     Chief Complaint   Animal Bite (BIBA from home. Per EMS, pt walked out to EMS truck c/o my cat broke my finger..the patient told EMS that he was attempting to get cat (received a week ago by guardian) from behind washer.  Animal control already involved per EMS, pt unsure of vaccination record of the feline.  Pt states has had fevers for days and has been bitten multiple times since Friday.  Last took Tylenol  approx 2 hours PTA.  Right hand edematous and red.)   History of Present Illness   Jose Li is a 69 y.o. year-old male presents to the ED with chief complaint of right hand pain.  States that he has been bitten by his cat multiple times since Friday.  Reports associated pain and swelling.  Reports fever at home.  Per EMS, animal control is monitoring.  Patient took Tylenol  prior to arrival for pain and fever.SABRA  History provided by patient. {RB interpreter (Optional):27221}  Review of Systems  Pertinent positive and negative review of systems noted in HPI.    Physical Exam   Vitals:   11/25/24 0504 11/25/24 0507  BP: (!) 150/70   Pulse: 67   Resp: 18   Temp: 98 F (36.7 C)   SpO2: 95% 98%    CONSTITUTIONAL:  non toxic-appearing, NAD NEURO:  Alert and oriented x 3, CN 3-12 grossly intact EYES:  eyes equal and reactive ENT/NECK:  Supple, no stridor  CARDIO:  normal rate, regular rhythm, appears well-perfused  PULM:  No respiratory distress,  GI/GU:  non-distended,  MSK/SPINE:  No gross deformities, swelling of the posterior right hand with surrounding erythema, slightly decreased flexion, normal extension SKIN:  no rash, multiple small wounds, no obvious abscess   *Additional and/or pertinent findings included in MDM below  Diagnostic and Interventional Summary    EKG Interpretation Date/Time:    Ventricular Rate:    PR Interval:    QRS  Duration:    QT Interval:    QTC Calculation:   R Axis:      Text Interpretation:         Labs Reviewed  CBC WITH DIFFERENTIAL/PLATELET - Abnormal; Notable for the following components:      Result Value   RBC 3.69 (*)    Hemoglobin 10.5 (*)    HCT 32.2 (*)    Monocytes Absolute 1.1 (*)    All other components within normal limits  BASIC METABOLIC PANEL WITH GFR - Abnormal; Notable for the following components:   Potassium 2.8 (*)    CO2 21 (*)    Glucose, Bld 114 (*)    Calcium  8.7 (*)    All other components within normal limits    DG Hand Complete Right  Final Result      Medications  vancomycin  (VANCOREADY) IVPB 1500 mg/300 mL (1,500 mg Intravenous New Bag/Given 11/25/24 0610)  Ampicillin -Sulbactam (UNASYN ) 3 g in sodium chloride  0.9 % 100 mL IVPB (0 g Intravenous Stopped 11/25/24 0606)  potassium chloride  10 mEq in 100 mL IVPB (has no administration in time range)     Procedures  /  Critical Care Procedures  ED Course and Medical Decision Making  I have reviewed the triage vital signs, the nursing notes, and pertinent available records from the EMR.  Social Determinants Affecting Complexity of Care: Patient has no clinically significant social determinants affecting  this chief complaint.. {rbsocialsolutions:27068}  ED Course:    Medical Decision Making Patient here with swelling and erythema from cat bites to the right hand.  No obvious abscess.  Nothing that looks drainable on exam.  No signs or symptoms of flexor tenosynovitis.  Most of the swelling is on the dorsum of the hand, but due to the amount, location, and risk for this worsening, feel that he should be admitted for obs with IV antibiotics.  Patient also noted to be hypokalemic to 2.8.  Will replace with IV K.  Amount and/or Complexity of Data Reviewed Labs: ordered. Radiology: ordered.  Risk Prescription drug management.      {rbcpddx (Optional):29772:::1} {rbabdddx  (Optional):29773:s::1}  Consultants: {rbconsultants:27072}   Treatment and Plan: Patient's exam and diagnostic results are concerning for cellulitis.  Feel that patient will need admission to the hospital for further treatment and evaluation.  {rbattending:27073}  Final Clinical Impressions(s) / ED Diagnoses     ICD-10-CM   1. Cat bite, initial encounter  W55.01XA     2. Cellulitis of right upper extremity  L03.113       ED Discharge Orders     None         Discharge Instructions Discussed with and Provided to Patient:   Discharge Instructions   None    "

## 2024-11-25 NOTE — Progress Notes (Signed)
 Carryover: hand swelling 2/2 animal bite. Admit for in abx

## 2024-11-25 NOTE — Assessment & Plan Note (Signed)
 Monitoring and replating

## 2024-11-25 NOTE — Assessment & Plan Note (Signed)
 History of Hypotension Blood pressure currently elevated, home medication of midodrine  will be discontinued Will monitor BP closely

## 2024-11-25 NOTE — Progress Notes (Signed)
 Pharmacy Antibiotic Note  Jose Li is a 69 y.o. male admitted on 11/25/2024 with multiple cat bites.  Pharmacy has been consulted for Vanco dosing.  ID: Multiple Cat bites Afebrile, WBC 8.4,  Vanco 1/11>> Unasyn  1/11>>  Plan: Unasyn  3g IV q6hr Vanco 1500mg  IV x 1 in ED Vancomycin  750 mg IV Q 8 hrs. Goal AUC 400-550. Expected AUC: 513 SCr used: 0.88    Height: 5' 11 (180.3 cm) Weight: 78.6 kg (173 lb 4.5 oz) IBW/kg (Calculated) : 75.3  Temp (24hrs), Avg:98 F (36.7 C), Min:98 F (36.7 C), Max:98 F (36.7 C)  Recent Labs  Lab 11/25/24 0527  WBC 8.4  CREATININE 0.88    Estimated Creatinine Clearance: 87.9 mL/min (by C-G formula based on SCr of 0.88 mg/dL).    Allergies[1]  Taina Landry Karoline Marina, PharmD, BCPS Clinical Staff Pharmacistv Marina Salines [facility] 11/25/2024 6:40 AM     [1]  Allergies Allergen Reactions   Bee Venom Other (See Comments)    Exact reaction not recalled, but is allergic   Cymbalta  [Duloxetine  Hcl] Other (See Comments)    Hyponatremia    Erythromycin Other (See Comments)    Reaction happened during childhood- not recalled   Hctz [Hydrochlorothiazide ] Other (See Comments)    Syncope   Inderal  [Propranolol ] Other (See Comments)    Syncope

## 2024-11-25 NOTE — Assessment & Plan Note (Signed)
 Continue tamsulosin .

## 2024-11-25 NOTE — Assessment & Plan Note (Addendum)
 Stable continue- Keppra , Vimpat 

## 2024-11-25 NOTE — Hospital Course (Signed)
 Jose Li is a 70 year old male past medical history of hypertension hyperlipidemia seizures, bipolar disorder, BPH... Presenting with a right hand cat bite.  Presented to ED with chief complaint of  my cath broke my finger, apparently was reaching out behind a washer to retrieve the cat where he received multiple bites from the cat on his right hand. Patient is unsure of vaccination record of the feline.  Patient reports now he has been having fever, chills.  Also noted some swelling and redness. Patient sustained an injury last Friday, now developing swelling redness, with subjective fever.

## 2024-11-25 NOTE — Assessment & Plan Note (Signed)
 Creatinine at baseline, monitor closely Continue gentle IV fluid hydration Avoiding nephrotoxin, hypotension Lab Results  Component Value Date   CREATININE 0.88 11/25/2024   CREATININE 1.08 09/11/2024   CREATININE 1.21 08/30/2024

## 2024-11-25 NOTE — Care Management Obs Status (Signed)
 MEDICARE OBSERVATION STATUS NOTIFICATION   Patient Details  Name: STEEL KERNEY MRN: [id number] Date of Birth: March 08, 1956   Medicare Observation Status Notification Given:  Yes    Sonda Manuella Quill, RN 11/25/2024, 3:40 PM

## 2024-11-25 NOTE — Plan of Care (Signed)

## 2024-11-25 NOTE — ED Triage Notes (Signed)
 BIBA from home. Per EMS, pt walked out to EMS truck c/o my cat broke my finger..the patient told EMS that he was attempting to get cat (received a week ago by guardian) from behind washer.  Animal control already involved per EMS, pt unsure of vaccination record of the feline.  Pt states has had fevers for days and has been bitten multiple times since Friday.  Last took Tylenol  approx 2 hours PTA.  Right hand edematous and red.

## 2024-11-25 NOTE — H&P (Signed)
 " History and Physical   Patient: Jose Li                            PCP: Diona Perkins, MD                    DOB: 08-31-1956            DOA: 11/25/2024 FMW:[id number]             DOS: 11/25/2024, 9:10 AM  Diona Perkins, MD  Patient coming from:   HOME  I have personally reviewed patient's medical records, in electronic medical records, including:  [facility], and care everywhere.    Chief Complaint:   Chief Complaint  Patient presents with   Animal Bite    BIBA from home. Per EMS, pt walked out to EMS truck c/o my cat broke my finger..the patient told EMS that he was attempting to get cat (received a week ago by guardian) from behind washer.  Animal control already involved per EMS, pt unsure of vaccination record of the feline.  Pt states has had fevers for days and has been bitten multiple times since Friday.  Last took Tylenol  approx 2 hours PTA.  Right hand edematous and red.    History of present illness:    Jose Li is a 69 year old male past medical history of hypertension hyperlipidemia seizures, bipolar disorder, BPH... Presenting with a right hand cat bite.  Presented to ED with chief complaint of  my cath broke my finger, apparently was reaching out behind a washer to retrieve the cat where he received multiple bites from the cat on his right hand. Patient is unsure of vaccination record of the feline.  Patient reports now he has been having fever, chills.  Also noted some swelling and redness. Patient sustained an injury last Friday, now developing swelling redness, with subjective fever.   Patient Denies having: , Cough, SOB, Chest Pain, Abd pain, N/V/D, headache, dizziness, lightheadedness,  Dysuria, Joint pain, rash, open wounds    Review of Systems: As per HPI, otherwise 10 point review of systems were negative.    ----------------------------------------------------------------------------------------------------------------------  Allergies[1]  Home MEDs:  Prior to Admission medications  Medication Sig Start Date End Date Taking? Authorizing Provider  Aromatic Inhalants (VICKS VAPOR INHALER IN) Place 1 Inhalation into both nostrils as needed (for congestion).    [provider]  aspirin  81 MG EC tablet Take 81 mg by mouth in the morning.    [provider]  atorvastatin  (LIPITOR) 40 MG tablet TAKE 1 TABLET BY MOUTH DAILY 04/23/24   Okey Vina GAILS, MD  benztropine  (COGENTIN ) 1 MG tablet Take 1 mg by mouth at bedtime.    [provider]  bismuth  subsalicylate (PEPTO BISMOL) 262 MG/15ML suspension Take 30 mLs by mouth every 6 (six) hours as needed for diarrhea or loose stools.    [provider]  Boswellia-Glucosamine-Vit D (OSTEO BI-FLEX ONE PER DAY PO) Take 1 tablet by mouth every other day.    [provider]  busPIRone  (BUSPAR ) 5 MG tablet Take 5 mg by mouth See admin instructions. Take 5 mg by mouth in the morning and afternoon 07/06/17   [provider]  cetirizine  (ZYRTEC ) 10 MG tablet Take 10 mg by mouth daily.    [provider]  citalopram  (CELEXA ) 20 MG tablet Take 20 mg by mouth in the morning.    [provider]  feeding supplement (BOOST HIGH PROTEIN) LIQD Take 1 Container by mouth See admin instructions. Drink 1 container of CHOCOLATE Boost High Calorie formula liquid by mouth one to three times a day between meals as needed for nutritional supplementation    [provider]  finasteride  (PROSCAR ) 5 MG tablet Take 1 tablet (5 mg total) by mouth in the morning. 10/31/24   Stoneking, Adine PARAS., MD  folic acid  (FOLVITE ) 1 MG tablet Take 1 tablet (1 mg total) by mouth daily. 08/02/24   Sherrill Cable Latif, DO  Lacosamide  (VIMPAT ) 150 MG TABS Take 1 tablet (150 mg total) by mouth in the morning and at bedtime. 10/27/24    Sater, Charlie LABOR, MD  levETIRAcetam  (KEPPRA ) 750 MG tablet TAKE 2 TABLETS BY MOUTH TWICE  DAILY 10/02/24   Gayland Lauraine PARAS, NP  midodrine  (PROAMATINE ) 5 MG tablet Take 5-10 mg by mouth See admin instructions. Take 10 mg by mouth in the morning and 5 mg at 2 PM- WITH MEALS 07/29/24   [provider]  Multiple Vitamin (MULTIVITAMIN) tablet Take 1 tablet by mouth daily with breakfast.    [provider]  ondansetron  (ZOFRAN -ODT) 4 MG disintegrating tablet DISSOLVE 1 TABLET IN MOUTH EVERY 8 HOURS AS NEEDED FOR NAUSEA OR VOMITING 09/20/24   Diona Perkins, MD  PEPTO BISMOL ULTRA 525 MG TABS Take 525 mg by mouth every 6 (six) hours as needed (for diarrhea- if not taking the liquid version).    [provider]  potassium chloride  (KLOR-CON ) 10 MEQ tablet Take 10 mEq by mouth daily. 03/24/23   [provider]  pregabalin  (LYRICA ) 75 MG capsule TAKE 1 CAPSULE BY MOUTH TWICE  DAILY 09/19/24   Gayland Lauraine PARAS, NP  risperiDONE  (RISPERDAL ) 2 MG tablet Take 2 mg by mouth at bedtime.     [provider]  tamsulosin  (FLOMAX ) 0.4 MG CAPS capsule Take 0.4 mg by mouth at bedtime. 08/20/20   [provider]  thiamine  250 MG tablet Take 250 mg by mouth daily.    [provider]  traZODone  (DESYREL ) 100 MG tablet Take 300 mg by mouth at bedtime. 09/24/15   [provider]  TYLENOL  500 MG tablet Take 500 mg by mouth every 8 (eight) hours as needed (for pain- WHEN NOT TAKING THE 650 MG STRENGTH).    [provider]  TYLENOL  8 HOUR ARTHRITIS PAIN 650 MG CR tablet Take 650 mg by mouth every 8 (eight) hours as needed for pain (WHEN NOT TAKING THE 500 MG STRENGTH).    [provider]    PRN MEDs: acetaminophen  **OR** acetaminophen , hydrALAZINE , HYDROmorphone  (DILAUDID ) injection, ipratropium, ondansetron  **OR** ondansetron  (ZOFRAN ) IV, oxyCODONE , senna-docusate  Past Medical History:  Diagnosis Date   Acute kidney injury 05/26/2018   Allergy     Anemia    Anxiety    Arthritis    LEGS,BACK   Cataract    BILATERAL   CKD (chronic kidney disease), stage III (HCC)    Depression    History of kidney stones    Hyperlipidemia    Hypertension, essential, benign 08/02/2012   Orthostatic hypotension 10/09/2013   OSA (obstructive sleep apnea) 10/01/2015   Severe OSA with an AHI of 86/hr now on BiPAP at 23/19cm H2O   Pneumonia    Rhabdomyolysis 08/24/2022   Seizures (HCC)    none for last 6-43months   Sleep apnea    Substance abuse (HCC)     Past Surgical History:  Procedure Laterality Date   COLONOSCOPY  CRANIOTOMY Left 06/08/2022   Procedure: CRANIOTOMY HEMATOMA EVACUATION SUBDURAL;  Surgeon: Louis Shove, MD;  [facility];  Service: Neurosurgery;  Laterality: Left;   CYSTOSCOPY WITH RETROGRADE PYELOGRAM, URETEROSCOPY AND STENT PLACEMENT Right 03/29/2023   Procedure: CYSTOSCOPY WITH RETROGRADE PYELOGRAM, URETEROSCOPY AND STENT PLACEMENT;  Surgeon: Shona Layman BROCKS, MD;  Location: [facility] ORS;  Service: Urology;  Laterality: Right;   EXTRACORPOREAL SHOCK WAVE LITHOTRIPSY Right 03/28/2023   Procedure: EXTRACORPOREAL SHOCK WAVE LITHOTRIPSY (ESWL);  Surgeon: Shona Layman BROCKS, MD;  Location: [facility];  Service: Urology;  Laterality: Right;  Local anesthesia   FRACTURE SURGERY  1990   right hip and femur   HERNIA REPAIR  07/2009   umbilical hernia/ got infected   JOINT REPLACEMENT Right 1990   right hip   TRACHEOSTOMY  08/1999   Had pneumoniaand was in a coma 10 days   WISDOM TOOTH EXTRACTION       reports that he has been smoking cigarettes. He has a 30 pack-year smoking history. He has been exposed to tobacco smoke. He has never used smokeless tobacco. He reports that he does not currently use alcohol. He reports that he does not currently use drugs.   Family History  Problem Relation Age of Onset   Heart disease Mother    Diabetes Father    Heart disease Brother    Hypertension Neg Hx    Stroke Neg Hx     Colon cancer Neg Hx    Colon polyps Neg Hx    Crohn's disease Neg Hx    Esophageal cancer Neg Hx    Rectal cancer Neg Hx    Stomach cancer Neg Hx    Ulcerative colitis Neg Hx     Physical Exam:   Vitals:   11/25/24 0504 11/25/24 0507 11/25/24 0513 11/25/24 0819  BP: (!) 150/70   (!) 145/64  Pulse: 67   62  Resp: 18   17  Temp: 98 F (36.7 C)   (!) 97.5 F (36.4 C)  TempSrc: Oral   Oral  SpO2: 95% 98%  100%  Weight:   78.6 kg   Height:   5' 11 (1.803 m)    Constitutional: NAD, calm, comfortable Eyes: PERRL, lids and conjunctivae normal ENMT: Mucous membranes are moist. Posterior pharynx clear of any exudate or lesions.Normal dentition.  Neck: normal, supple, no masses, no thyromegaly Respiratory: clear to auscultation bilaterally, no wheezing, no crackles. Normal respiratory effort. No accessory muscle use.  Cardiovascular: Regular rate and rhythm, no murmurs / rubs / gallops. No extremity edema. 2+ pedal pulses. No carotid bruits.  Abdomen: no tenderness, no masses palpated. No hepatosplenomegaly. Bowel sounds positive.  Musculoskeletal: no clubbing / cyanosis. No joint deformity upper and lower extremities. Good ROM, no contractures. Normal muscle tone.  Neurologic: CN II-XII grossly intact. Sensation intact, DTR normal. Strength 5/5 in all 4.  Psychiatric: Normal judgment and insight. Alert and oriented x 3. Normal mood.  Skin: no rashes, lesions, ulcers. No induration     Labs on admission:    I have personally reviewed following labs and imaging studies  CBC: Recent Labs  Lab 11/25/24 0527  WBC 8.4  NEUTROABS 5.8  HGB 10.5*  HCT 32.2*  MCV 87.3  PLT 244   Basic Metabolic Panel: Recent Labs  Lab 11/25/24 0527  NA 141  K 2.8*  CL 109  CO2 21*  GLUCOSE 114*  BUN 9  CREATININE 0.88  CALCIUM  8.7*    Urine analysis:  Component Value Date/Time   COLORURINE YELLOW 09/11/2024 1323   APPEARANCEUR CLEAR 09/11/2024 1323   APPEARANCEUR Clear  02/15/2024 0000   LABSPEC 1.006 09/11/2024 1323   PHURINE 5.0 09/11/2024 1323   GLUCOSEU NEGATIVE 09/11/2024 1323   GLUCOSEU NEGATIVE 12/03/2013 1140   HGBUR NEGATIVE 09/11/2024 1323   BILIRUBINUR NEGATIVE 09/11/2024 1323   BILIRUBINUR Negative 02/15/2024 0000   KETONESUR NEGATIVE 09/11/2024 1323   PROTEINUR NEGATIVE 09/11/2024 1323   UROBILINOGEN 0.2 12/03/2013 1140   NITRITE NEGATIVE 09/11/2024 1323   LEUKOCYTESUR NEGATIVE 09/11/2024 1323    Last A1C:  Lab Results  Component Value Date   HGBA1C 5.9 (H) 10/19/2023     Radiologic Exams on Admission:   DG Hand Complete Right Result Date: 11/25/2024 EXAM: 3 OR MORE VIEW(S) XRAY OF THE HAND 11/25/2024 06:05:54 AM COMPARISON: Right wrist series 01/28/2016. CLINICAL HISTORY: 69 year old male with pain, swelling, and multiple cat bites. FINDINGS: BONES AND JOINTS: Distal radial styloid fracture appears healed since 2017. No acute fracture. No malalignment. SOFT TISSUES: Generalized soft tissue swelling. And especially swelling of the second digit. No soft tissue gas. No radiopaque foreign body. IMPRESSION: 1. No acute osseous abnormality identified about the right hand. 2. Soft tissue swelling, greatest at the second digit; no gas or radiopaque foreign body. Electronically signed by: Helayne Hurst MD MD 11/25/2024 06:16 AM EST RP Workstation: [id number]    EKG:   Independently reviewed.  Orders placed or performed during the hospital encounter of 11/25/24   EKG 12-Lead   ---------------------------------------------------------------------------------------------------------------------------------------    Assessment / Plan:   Principal Problem:   Animal bite Active Problems:   Hypokalemia   BIPOLAR DISORDER   Epilepsy (HCC)   Essential hypertension   Chronic kidney disease (CKD), stage III (moderate) (HCC)   History of BPH   Hyperlipidemia   Hypomagnesemia   Assessment and Plan: * Animal bite Status post right hand  cat bite -Edema erythema subjective fever - Follow-up with blood cultures - Patient has been initiated on broad-spectrum antibiotics of vancomycin  and and Unasyn    Hypokalemia Monitoring and replating orally  History of BPH Continue tamsulosin   Chronic kidney disease (CKD), stage III (moderate) (HCC) Creatinine at baseline, monitor closely Continue gentle IV fluid hydration Avoiding nephrotoxin, hypotension Lab Results  Component Value Date   CREATININE 0.88 11/25/2024   CREATININE 1.08 09/11/2024   CREATININE 1.21 08/30/2024     Essential hypertension History of Hypotension Blood pressure currently elevated, home medication of midodrine  will be discontinued Will monitor BP closely  Epilepsy (HCC) Stable continue- Keppra , Vimpat   BIPOLAR DISORDER Mood remains stable, and on multiple medication Need to be verified: Currently listed: BuSpar , Celexa , Lyrica , risperidone ,   Hypomagnesemia Monitoring and replating  Hyperlipidemia Continue statins   Prediabetic -Last A1c of 5.9 -       Consults called:  None -------------------------------------------------------------------------------------------------------------------------------------------- DVT prophylaxis:  heparin  injection 5,000 Units Start: 11/25/24 0815 SCDs Start: 11/25/24 0805   Code Status:   Code Status: Full Code   Admission status: Patient will be admitted as Observation, with a greater than 2 midnight length of stay. Level of care: Med-Surg   Family Communication:  none at bedside  (The above findings and plan of care has been discussed with patient in detail, the patient expressed understanding and agreement of above plan)  --------------------------------------------------------------------------------------------------------------------------------------------------  Disposition Plan:  Anticipated 1-2 days Status is: Observation The patient remains OBS appropriate and will d/c  before 2 midnights.     ----------------------------------------------------------------------------------------------------------------------------------------------------  Time spent:  24  Min.  Was spent seeing and evaluating the patient, reviewing all medical records, drawn plan of care.  SIGNED: Adriana DELENA Grams, MD, FHM. FAAFP. [place] [facility], Pager  (Please use [email or web address] to page/ or secure chat through epic) If 7PM-7AM, please contact night-coverage [email or web address],  11/25/2024, 9:10 AM     [1]  Allergies Allergen Reactions   Bee Venom Other (See Comments)    Exact reaction not recalled, but is allergic   Cymbalta  [Duloxetine  Hcl] Other (See Comments)    Hyponatremia    Erythromycin Other (See Comments)    Reaction happened during childhood- not recalled   Hctz [Hydrochlorothiazide ] Other (See Comments)    Syncope   Inderal  [Propranolol ] Other (See Comments)    Syncope    "

## 2024-11-25 NOTE — Assessment & Plan Note (Signed)
 Continue statins

## 2024-11-26 DIAGNOSIS — Z87891 Personal history of nicotine dependence: Secondary | ICD-10-CM | POA: Diagnosis not present

## 2024-11-26 DIAGNOSIS — L03113 Cellulitis of right upper limb: Secondary | ICD-10-CM | POA: Diagnosis present

## 2024-11-26 DIAGNOSIS — E785 Hyperlipidemia, unspecified: Secondary | ICD-10-CM | POA: Diagnosis present

## 2024-11-26 DIAGNOSIS — W5501XA Bitten by cat, initial encounter: Secondary | ICD-10-CM | POA: Diagnosis not present

## 2024-11-26 DIAGNOSIS — T148XXA Other injury of unspecified body region, initial encounter: Secondary | ICD-10-CM | POA: Diagnosis not present

## 2024-11-26 DIAGNOSIS — Z96641 Presence of right artificial hip joint: Secondary | ICD-10-CM | POA: Diagnosis present

## 2024-11-26 DIAGNOSIS — I129 Hypertensive chronic kidney disease with stage 1 through stage 4 chronic kidney disease, or unspecified chronic kidney disease: Secondary | ICD-10-CM | POA: Diagnosis present

## 2024-11-26 DIAGNOSIS — Z7982 Long term (current) use of aspirin: Secondary | ICD-10-CM | POA: Diagnosis not present

## 2024-11-26 DIAGNOSIS — R7303 Prediabetes: Secondary | ICD-10-CM | POA: Diagnosis present

## 2024-11-26 DIAGNOSIS — Z8249 Family history of ischemic heart disease and other diseases of the circulatory system: Secondary | ICD-10-CM | POA: Diagnosis not present

## 2024-11-26 DIAGNOSIS — G4733 Obstructive sleep apnea (adult) (pediatric): Secondary | ICD-10-CM | POA: Diagnosis present

## 2024-11-26 DIAGNOSIS — S61451A Open bite of right hand, initial encounter: Secondary | ICD-10-CM | POA: Diagnosis present

## 2024-11-26 DIAGNOSIS — E876 Hypokalemia: Secondary | ICD-10-CM | POA: Diagnosis present

## 2024-11-26 DIAGNOSIS — F319 Bipolar disorder, unspecified: Secondary | ICD-10-CM | POA: Diagnosis present

## 2024-11-26 DIAGNOSIS — G40909 Epilepsy, unspecified, not intractable, without status epilepticus: Secondary | ICD-10-CM | POA: Diagnosis present

## 2024-11-26 DIAGNOSIS — F419 Anxiety disorder, unspecified: Secondary | ICD-10-CM | POA: Diagnosis present

## 2024-11-26 DIAGNOSIS — N400 Enlarged prostate without lower urinary tract symptoms: Secondary | ICD-10-CM | POA: Diagnosis present

## 2024-11-26 DIAGNOSIS — N183 Chronic kidney disease, stage 3 unspecified: Secondary | ICD-10-CM | POA: Diagnosis present

## 2024-11-26 LAB — CBC
HCT: 34.5 % — ABNORMAL LOW (ref 39.0–52.0)
Hemoglobin: 11.5 g/dL — ABNORMAL LOW (ref 13.0–17.0)
MCH: 28.8 pg (ref 26.0–34.0)
MCHC: 33.3 g/dL (ref 30.0–36.0)
MCV: 86.5 fL (ref 80.0–100.0)
Platelets: 280 K/uL (ref 150–400)
RBC: 3.99 MIL/uL — ABNORMAL LOW (ref 4.22–5.81)
RDW: 13.8 % (ref 11.5–15.5)
WBC: 9.7 K/uL (ref 4.0–10.5)
nRBC: 0 % (ref 0.0–0.2)

## 2024-11-26 LAB — BASIC METABOLIC PANEL WITH GFR
Anion gap: 12 (ref 5–15)
BUN: 6 mg/dL — ABNORMAL LOW (ref 8–23)
CO2: 20 mmol/L — ABNORMAL LOW (ref 22–32)
Calcium: 8.6 mg/dL — ABNORMAL LOW (ref 8.9–10.3)
Chloride: 110 mmol/L (ref 98–111)
Creatinine, Ser: 0.79 mg/dL (ref 0.61–1.24)
GFR, Estimated: >60 mL/min
Glucose, Bld: 94 mg/dL (ref 70–99)
Potassium: 3.3 mmol/L — ABNORMAL LOW (ref 3.5–5.1)
Sodium: 141 mmol/L (ref 135–145)

## 2024-11-26 MED ORDER — MAGNESIUM SULFATE 2 GM/50ML IV SOLN
2.0000 g | Freq: Once | INTRAVENOUS | Status: AC
  Administered 2024-11-26: 2 g via INTRAVENOUS
  Filled 2024-11-26: qty 50

## 2024-11-26 MED ORDER — VANCOMYCIN HCL IN DEXTROSE 1-5 GM/200ML-% IV SOLN
1000.0000 mg | Freq: Two times a day (BID) | INTRAVENOUS | Status: DC
  Administered 2024-11-26 – 2024-11-27 (×2): 1000 mg via INTRAVENOUS
  Filled 2024-11-26 (×2): qty 200

## 2024-11-26 MED ORDER — POTASSIUM CHLORIDE 20 MEQ PO PACK
40.0000 meq | PACK | ORAL | Status: AC
  Administered 2024-11-26 (×2): 40 meq via ORAL
  Filled 2024-11-26 (×2): qty 2

## 2024-11-26 NOTE — Evaluation (Signed)
 Occupational Therapy Evaluation Patient Details Name: Jose Li MRN: [id number] DOB: 10/06/1956 Today's Date: 11/26/2024   History of Present Illness   Pt is a 69 y/o male presenting after multiple cat bites and fever. Started on antibiotics to treat cellulitis. PMH:HTN, HLD, seizures, bipolar disorder, BPH     Clinical Impressions PTA, pt lives alone and reports typically Modified Independent with ADLs, basic household IADLs and mobility with intermittent cane use. Pt denies any recent falls (noted some falls reported with Sept '25 admission). Pt presents now near baseline and only limited by R hand edema impacting coordination and grip strength. Pt able to mobilize in hallway without AD with CGA and manage ADLs with overall Modified Independence to Supervision. Encouraged elevation of RUE and provided graded squeeze sponge to progress grasp abilities. Anticipate continued improvements with antibiotics without need for immediate OT follow up. Educated pt that if deficits did persist after antibiotic course that OP OT could be beneficial to address in the future.      If plan is discharge home, recommend the following:   Assist for transportation;Other (comment) (PRN)     Functional Status Assessment   Patient has had a recent decline in their functional status and demonstrates the ability to make significant improvements in function in a reasonable and predictable amount of time.     Equipment Recommendations   None recommended by OT     Recommendations for Other Services         Precautions/Restrictions   Precautions Precautions: Fall Restrictions Weight Bearing Restrictions Per Provider Order: No     Mobility Bed Mobility Overal bed mobility: Modified Independent                  Transfers Overall transfer level: Modified independent Equipment used: None                      Balance Overall balance assessment: Mild deficits observed, not  formally tested                                         ADL either performed or assessed with clinical judgement   ADL Overall ADL's : Needs assistance/impaired Eating/Feeding: Set up Eating/Feeding Details (indicate cue type and reason): assist to open containers but able to self feed using R hand despite impaired grasp Grooming: Set up;Standing   Upper Body Bathing: Modified independent   Lower Body Bathing: Supervison/ safety   Upper Body Dressing : Modified independent   Lower Body Dressing: Supervision/safety;Minimal assistance Lower Body Dressing Details (indicate cue type and reason): will likely need some assist with shoelaces, zippers due to R hand swelling Toilet Transfer: Supervision/safety;Ambulation   Toileting- Clothing Manipulation and Hygiene: Supervision/safety;Sitting/lateral lean;Sit to/from stand       Functional mobility during ADLs: Contact guard assist General ADL Comments: CGA for hallway mobility with minor sway without AD but no overt LOB. Discussed elevation of RUE to decrease edema, provided squeeze sponge to work on gradual composite flexion with pt verbalizing understanding.     Vision Baseline Vision/History: 1 Wears glasses Ability to See in Adequate Light: 0 Adequate Patient Visual Report: No change from baseline Vision Assessment?: No apparent visual deficits     Perception         Praxis         Pertinent Vitals/Pain Pain Assessment Pain Assessment: Faces Faces Pain Scale:  Hurts a little bit Pain Location: R hand Pain Descriptors / Indicators: Grimacing, Guarding Pain Intervention(s): Monitored during session     Extremity/Trunk Assessment Upper Extremity Assessment Upper Extremity Assessment: Right hand dominant;RUE deficits/detail RUE Deficits / Details: R UE with redness and edema. reports unable to make a fist but able to extend digits. able to to demo holding utensils to feed self but did ask for assist to  open some containers. denies sensation changes RUE Coordination: decreased fine motor   Lower Extremity Assessment Lower Extremity Assessment: Defer to PT evaluation   Cervical / Trunk Assessment Cervical / Trunk Assessment: Normal   Communication Communication Communication: No apparent difficulties   Cognition Arousal: Alert Behavior During Therapy: WFL for tasks assessed/performed Cognition: No family/caregiver present to determine baseline             OT - Cognition Comments: hx of bipolar, likely at baseline. appropriate in PLOF recall and awareness of deficits                 Following commands: Intact       Cueing  General Comments   Cueing Techniques: Verbal cues      Exercises     Shoulder Instructions      Home Living Family/patient expects to be discharged to:: Private residence Living Arrangements: Alone Available Help at Discharge: Friend(s);Available PRN/intermittently (friends and girlfriend) Type of Home: House Home Access: Stairs to enter Secretary/administrator of Steps: 1-2 Entrance Stairs-Rails: None Home Layout: Two level Alternate Level Stairs-Number of Steps: 10 Alternate Level Stairs-Rails: Left Bathroom Shower/Tub: Tub/shower unit;Curtain   Bathroom Toilet: Standard     Home Equipment: Agricultural Consultant (2 wheels);Cane - single point          Prior Functioning/Environment Prior Level of Function : Independent/Modified Independent;History of Falls (last six months)             Mobility Comments: uses cane for mobility mostly ADLs Comments: soaks in tub, makes microwave meals, friends assist with transportation and assist with getting groceries. pt reports he has been signed up for transporation assist.    OT Problem List: Decreased strength;Decreased range of motion;Decreased coordination;Impaired UE functional use   OT Treatment/Interventions: Self-care/ADL training;Therapeutic exercise;Energy conservation;DME and/or  AE instruction;Therapeutic activities;Patient/family education;Balance training      OT Goals(Current goals can be found in the care plan section)   Acute Rehab OT Goals Patient Stated Goal: home today OT Goal Formulation: With patient Time For Goal Achievement: 12/10/24 Potential to Achieve Goals: Good ADL Goals Pt/caregiver will Perform Home Exercise Program: Increased ROM;Increased strength;Right Upper extremity;Independently;With written HEP provided Additional ADL Goal #1: Pt to complete all ADLs with MOD I implementing adaptive strategies as needed if R hand deficits persist   OT Frequency:  Min 2X/week    Co-evaluation              AM-PAC OT 6 Clicks Daily Activity     Outcome Measure Help from another person eating meals?: A Little Help from another person taking care of personal grooming?: A Little Help from another person toileting, which includes using toliet, bedpan, or urinal?: A Little Help from another person bathing (including washing, rinsing, drying)?: A Little Help from another person to put on and taking off regular upper body clothing?: A Little Help from another person to put on and taking off regular lower body clothing?: A Little 6 Click Score: 18   End of Session Nurse Communication: Mobility status  Activity Tolerance: Patient tolerated treatment  well Patient left: in bed;with call bell/phone within reach;with bed alarm set  OT Visit Diagnosis: Muscle weakness (generalized) (M62.81)                Time: 9279-9258 OT Time Calculation (min): 21 min Charges:  OT General Charges $OT Visit: 1 Visit OT Evaluation $OT Eval Low Complexity: 1 Low  Mliss NOVAK, OTR/L Acute Rehab Services Office: [phone]   Mliss Fish 11/26/2024, 7:52 AM

## 2024-11-26 NOTE — Plan of Care (Signed)
  Problem: Education: Goal: Knowledge of General Education information will improve Description: Including pain rating scale, medication(s)/side effects and non-pharmacologic comfort measures Outcome: Progressing   Problem: Clinical Measurements: Goal: Ability to maintain clinical measurements within normal limits will improve Outcome: Progressing Goal: Diagnostic test results will improve Outcome: Progressing Goal: Respiratory complications will improve Outcome: Progressing Goal: Cardiovascular complication will be avoided Outcome: Progressing   Problem: Activity: Goal: Risk for activity intolerance will decrease Outcome: Progressing   Problem: Nutrition: Goal: Adequate nutrition will be maintained Outcome: Progressing   Problem: Coping: Goal: Level of anxiety will decrease Outcome: Progressing   Problem: Elimination: Goal: Will not experience complications related to bowel motility Outcome: Progressing Goal: Will not experience complications related to urinary retention Outcome: Progressing   Problem: Pain Managment: Goal: General experience of comfort will improve and/or be controlled Outcome: Progressing   Problem: Safety: Goal: Ability to remain free from injury will improve Outcome: Progressing   Problem: Skin Integrity: Goal: Risk for impaired skin integrity will decrease Outcome: Progressing

## 2024-11-26 NOTE — Progress Notes (Signed)
 Mobility Specialist - Progress Note   11/26/24 0921  Mobility  Activity Ambulated with assistance  Level of Assistance Independent after set-up  Assistive Device None  Distance Ambulated (ft) 350 ft  Range of Motion/Exercises Active  Activity Response Tolerated well  Mobility Referral Yes  Mobility visit 1 Mobility  Mobility Specialist Start Time (ACUTE ONLY) 0907  Mobility Specialist Stop Time (ACUTE ONLY) 0915  Mobility Specialist Time Calculation (min) (ACUTE ONLY) 8 min   Received in door way requesting someone to ambulate with, had no issues throughout session, returned to chair with all needs met.  Cyndee Ada Mobility Specialist

## 2024-11-26 NOTE — TOC Initial Note (Addendum)
 Transition of Care [facility]) - Initial/Assessment Note    Patient Details  Name: Jose Li MRN: [id number] Date of Birth: Jul 27, 1956  Transition of Care [facility]) CM/SW Contact:    Doneta Glenys DASEN, RN Phone Number: 11/26/2024, 4:10 PM  Clinical Narrative:                 PTA lives alone. Patient states he is not interested in have [facility] PT. Plans on calling a taxi when discharged. No additional IP CM needs identified.  Expected Discharge Plan: Home w Home Health Services Barriers to Discharge: Continued Medical Work up   Patient Goals and CMS Choice Patient states their goals for this hospitalization and ongoing recovery are:: Home with [facility] CMS Medicare.gov Compare Post Acute Care list provided to:: Patient Choice offered to / list presented to : Patient [place] ownership interest in [facility].provided to:: Patient    Expected Discharge Plan and Services In-house Referral: NA Discharge Planning Services: CM Consult   Living arrangements for the past 2 months: Single Family Home                 DME Arranged: N/A DME Agency: NA                  Prior Living Arrangements/Services Living arrangements for the past 2 months: Single Family Home Lives with:: Adult Children Patient language and need for interpreter reviewed:: Yes Do you feel safe going back to the place where you live?: Yes      Need for Family Participation in Patient Care: No (Comment) Care giver support system in place?: Yes (comment) Current home services: DME (rollator) Criminal Activity/Legal Involvement Pertinent to Current Situation/Hospitalization: No - Comment as needed  Activities of Daily Living   ADL Screening (condition at time of admission) Independently performs ADLs?: Yes (appropriate for developmental age) Is the patient deaf or have difficulty hearing?: No Does the patient have difficulty seeing, even when wearing glasses/contacts?: No Does the patient have difficulty  concentrating, remembering, or making decisions?: No  Permission Sought/Granted Permission sought to share information with : Case Manager Permission granted to share information with : Yes, Verbal Permission Granted  Share Information with NAME: Katha Vina Benne, Emergency Contact  [phone]           Emotional Assessment Appearance:: Appears stated age Attitude/Demeanor/Rapport: Engaged Affect (typically observed): Appropriate Orientation: : Oriented to Self, Oriented to Place, Oriented to  Time, Oriented to Situation Alcohol / Substance Use: Not Applicable Psych Involvement: No (comment)  Admission diagnosis:  Animal bite [T14.8XXA] Cellulitis of right upper extremity [L03.113] Cat bite, initial encounter [W55.01XA] Patient Active Problem List   Diagnosis Date Noted   Animal bite 11/25/2024   Gastroenteritis 04/06/2024   Rhabdomyolysis 08/24/2022   Hypomagnesemia 08/24/2022   Tachycardia 07/26/2022   Malnutrition of moderate degree 06/10/2022   Status post craniotomy 06/08/2022   COVID-19 virus infection 04/14/2022   Need for immunization against influenza 09/11/2020   History of BPH 09/24/2019   Chronic kidney disease (CKD), stage III (moderate) (HCC) 07/13/2019   Varicose veins of right lower extremity with complications 01/24/2017   TBI (traumatic brain injury) (HCC)    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 [place] [address] Phone: [phone] Fax: [phone]  [facility] - [place],  [place] - [address] [place] [address] Phone: [phone] Fax: [phone]     Social Drivers of Health (SDOH) Social History: SDOH Screenings   Food Insecurity: No Food Insecurity (11/25/2024)  Housing: Low Risk (11/25/2024)  Transportation Needs: Unmet Transportation Needs (11/25/2024)  Utilities: Not At Risk (11/25/2024)  Alcohol Screen: Low Risk (05/07/2024)  Depression (PHQ2-9): Medium Risk (08/17/2024)  Financial Resource Strain: Low Risk (05/07/2024)  Physical Activity: Inactive (05/07/2024)  Social Connections: Socially Isolated (11/25/2024)  Stress: Stress Concern Present (05/07/2024)  Tobacco Use: High Risk (11/25/2024)  Health Literacy: Adequate Health Literacy (05/07/2024)   SDOH Interventions:     Readmission Risk Interventions    11/26/2024    4:08 PM 08/25/2022    1:50 PM  Readmission Risk Prevention Plan  Transportation Screening Complete Complete  PCP or Specialist Appt within 3-5 Days Complete Complete  HRI or Home Care Consult Complete Complete  Social Work Consult for Recovery Care Planning/Counseling Complete Complete  Palliative Care Screening Not Applicable Not Applicable  Medication Review Oceanographer) Complete Complete

## 2024-11-26 NOTE — Progress Notes (Signed)
 " PROGRESS NOTE    ROBINSON BRINKLEY  FMW:[id number] DOB: [birth year] DOA: 11/25/2024 PCP: Diona Perkins, MD  Outpatient Specialists:     Brief Narrative:  Patient is a 69 year old male past medical history significant for bipolar disorder, BPH, hyperlipidemia and seizures.  Patient was admitted with cat bite on right hand.  Patient is currently on IV Unasyn .  11/26/2024: No documented fever.  Potassium of 3.3, and magnesium  of 1.8.  Will replete magnesium  and potassium.   Assessment & Plan:   Principal Problem:   Animal bite Active Problems:   Hyperlipidemia   BIPOLAR DISORDER   Epilepsy (HCC)   Hypokalemia   Essential hypertension   Chronic kidney disease (CKD), stage III (moderate) (HCC)   History of BPH   Hypomagnesemia   Animal bite: -Status post right hand cat bite -Edema, erythema, subjective fever - Follow-up with blood cultures - Continue IV Unasyn  and vancomycin . - Patient is slowly improving.       Hypokalemia -Potassium of 2.8 on presentation, improved to 3.3. - Continue to repeat and monitor.    - Last magnesium  level was 1.8. - IV magnesium  2 g x 1 dose. - Monitor renal function and electrolytes.  History of BPH Continue tamsulosin    Chronic kidney disease (CKD), stage 2:  -As per prior documentation. - Previous records reviewed, renal function is noted to be normal.        Lab Results  Component Value Date    CREATININE 0.88 11/25/2024    CREATININE 1.08 09/11/2024    CREATININE 1.21 08/30/2024        Essential hypertension -Continue to monitor closely and optimize.  Epilepsy (HCC) Stable continue- Keppra , Vimpat    BIPOLAR DISORDER -Stable.  Hypomagnesemia -Last magnesium  level of 1.8   Hyperlipidemia Continue statins     Prediabetic -Last A1c of 5.9  DVT prophylaxis: Subcu heparin  Code Status: Full code Family Communication:  Disposition Plan: This will depend on hospital course   Consultants:  None  Procedures:   None  Antimicrobials:  IV Unasyn  IV vancomycin    Subjective: No new complaints  Objective: Vitals:   11/25/24 1517 11/25/24 2032 11/26/24 0420 11/26/24 0437  BP: (!) 158/71 (!) 151/67 (!) 152/96   Pulse: 63 66 81   Resp: 18 18 17    Temp: 98 F (36.7 C) 99 F (37.2 C) (!) 97.5 F (36.4 C)   TempSrc: Oral Oral Oral   SpO2: 99% 99% 94%   Weight:    71.9 kg  Height:        Intake/Output Summary (Last 24 hours) at 11/26/2024 1100 Last data filed at 11/26/2024 0900 Gross per 24 hour  Intake 3085.97 ml  Output --  Net 3085.97 ml   Filed Weights   11/25/24 0513 11/26/24 0437  Weight: 78.6 kg 71.9 kg    Examination:  General exam: Appears calm and comfortable  Respiratory system: Clear to auscultation. Respiratory effort normal. Cardiovascular system: S1 & S2 heard Gastrointestinal system: Abdomen is soft and nontender.  Central nervous system: Alert and oriented.  . Extremities:        Data Reviewed: I have personally reviewed following labs and imaging studies  CBC: Recent Labs  Lab 11/25/24 0527 11/26/24 0405  WBC 8.4 9.7  NEUTROABS 5.8  --   HGB 10.5* 11.5*  HCT 32.2* 34.5*  MCV 87.3 86.5  PLT 244 280   Basic Metabolic Panel: Recent Labs  Lab 11/25/24 0527 11/25/24 1540 11/26/24 0405  NA 141  --  141  K 2.8* 3.3* 3.3*  CL 109  --  110  CO2 21*  --  20*  GLUCOSE 114*  --  94  BUN 9  --  6*  CREATININE 0.88  --  0.79  CALCIUM  8.7*  --  8.6*  MG 1.8  --   --   PHOS 3.2  --   --    GFR: Estimated Creatinine Clearance: 92.4 mL/min (by C-G formula based on SCr of 0.79 mg/dL). Liver Function Tests: No results for input(s): AST, ALT, ALKPHOS, BILITOT, PROT, ALBUMIN in the last 168 hours. No results for input(s): LIPASE, AMYLASE in the last 168 hours. No results for input(s): AMMONIA in the last 168 hours. Coagulation Profile: No results for input(s): INR, PROTIME in the last 168 hours. Cardiac Enzymes: No results for  input(s): CKTOTAL, CKMB, CKMBINDEX, TROPONINI in the last 168 hours. BNP (last 3 results) No results for input(s): PROBNP in the last 8760 hours. HbA1C: No results for input(s): HGBA1C in the last 72 hours. CBG: No results for input(s): GLUCAP in the last 168 hours. Lipid Profile: No results for input(s): CHOL, HDL, LDLCALC, TRIG, CHOLHDL, LDLDIRECT in the last 72 hours. Thyroid  Function Tests: No results for input(s): TSH, T4TOTAL, FREET4, T3FREE, THYROIDAB in the last 72 hours. Anemia Panel: No results for input(s): VITAMINB12, FOLATE, FERRITIN, TIBC, IRON, RETICCTPCT in the last 72 hours. Urine analysis:    Component Value Date/Time   COLORURINE YELLOW 09/11/2024 1323   APPEARANCEUR CLEAR 09/11/2024 1323   APPEARANCEUR Clear 02/15/2024 0000   LABSPEC 1.006 09/11/2024 1323   PHURINE 5.0 09/11/2024 1323   GLUCOSEU NEGATIVE 09/11/2024 1323   GLUCOSEU NEGATIVE 12/03/2013 1140   HGBUR NEGATIVE 09/11/2024 1323   BILIRUBINUR NEGATIVE 09/11/2024 1323   BILIRUBINUR Negative 02/15/2024 0000   KETONESUR NEGATIVE 09/11/2024 1323   PROTEINUR NEGATIVE 09/11/2024 1323   UROBILINOGEN 0.2 12/03/2013 1140   NITRITE NEGATIVE 09/11/2024 1323   LEUKOCYTESUR NEGATIVE 09/11/2024 1323   Sepsis Labs: @LABRCNTIP (procalcitonin:4,lacticidven:4)  )No results found for this or any previous visit (from the past 240 hours).       Radiology Studies: DG Hand Complete Right Result Date: 11/25/2024 EXAM: 3 OR MORE VIEW(S) XRAY OF THE HAND 11/25/2024 06:05:54 AM COMPARISON: Right wrist series 01/28/2016. CLINICAL HISTORY: 69 year old male with pain, swelling, and multiple cat bites. FINDINGS: BONES AND JOINTS: Distal radial styloid fracture appears healed since 2017. No acute fracture. No malalignment. SOFT TISSUES: Generalized soft tissue swelling. And especially swelling of the second digit. No soft tissue gas. No radiopaque foreign body. IMPRESSION: 1. No  acute osseous abnormality identified about the right hand. 2. Soft tissue swelling, greatest at the second digit; no gas or radiopaque foreign body. Electronically signed by: Helayne Hurst MD MD 11/25/2024 06:16 AM EST RP Workstation: [id number]        Scheduled Meds:  aspirin  EC  81 mg Oral Daily   atorvastatin   40 mg Oral Daily   benztropine   1 mg Oral QHS   busPIRone   5 mg Oral BID   citalopram   20 mg Oral Daily   finasteride   5 mg Oral Daily   heparin   5,000 Units Subcutaneous Q8H   lacosamide   150 mg Oral BID   levETIRAcetam   1,500 mg Oral BID   multivitamin with minerals  1 tablet Oral Q breakfast   pregabalin   75 mg Oral BID   risperiDONE   2 mg Oral QHS   sodium chloride  flush  3 mL Intravenous Q12H   sodium chloride  flush  3 mL Intravenous Q12H   tamsulosin   0.4 mg Oral QHS   thiamine   250 mg Oral Daily   traZODone   200 mg Oral QHS   Continuous Infusions:  ampicillin -sulbactam (UNASYN ) IV 3 g (11/26/24 0434)   vancomycin  750 mg (11/26/24 0553)     LOS: 0 days    Time spent: 55 minutes.    Leatrice Chapel, MD  [facility] 7PM-7AM contact night coverage as above    "

## 2024-11-26 NOTE — Progress Notes (Signed)
 Pharmacy Antibiotic Note  Jose Li is a 69 y.o. male who presented to the ED on 11/25/2024 with c/o multiple cat bites on his right hand.  He is currently on vancomycin  and unasyn  for wound infection.  Today, 11/26/2024: - day #2 abx - wbc wnl - scr 0.79 (crcl~92)   Plan: - unasyn  3gm IV q6h - adjust vancomycin  dose to 1000 mg q12h for est AUC 475  ____________________________________________  Height: 5' 11 (180.3 cm) Weight: 71.9 kg (158 lb 8.2 oz) IBW/kg (Calculated) : 75.3  Temp (24hrs), Avg:98.2 F (36.8 C), Min:97.5 F (36.4 C), Max:99 F (37.2 C)  Recent Labs  Lab 11/25/24 0527 11/26/24 0405  WBC 8.4 9.7  CREATININE 0.88 0.79    Estimated Creatinine Clearance: 92.4 mL/min (by C-G formula based on SCr of 0.79 mg/dL).    Allergies[1]   Thank you for allowing pharmacy to be a part of this patients care.  Jose Li 11/26/2024 12:19 PM     [1]  Allergies Allergen Reactions   Bee Venom Other (See Comments)    Exact reaction not recalled, but is allergic   Cymbalta  [Duloxetine  Hcl] Other (See Comments)    Hyponatremia    Erythromycin Other (See Comments)    Reaction happened during childhood- not recalled   Hctz [Hydrochlorothiazide ] Other (See Comments)    Syncope   Inderal  [Propranolol ] Other (See Comments)    Syncope

## 2024-11-26 NOTE — Evaluation (Signed)
 Physical Therapy Evaluation Patient Details Name: Jose Li MRN: [id number] DOB: 1956/04/13 Today's Date: 11/26/2024  History of Present Illness  Pt is a 69 y/o male presenting after multiple cat bites and fever. Started on antibiotics to treat cellulitis. PMH:HTN, HLD, seizures, bipolar disorder, BPH  Clinical Impression  Pt admitted with above diagnosis. PTA, pt ind with intermittent SPC or RW use, ind with ADLs/IADLs, friends support with transportation, admits to a fall down the stairs in the past so occasionally ascends/descends steps on bottom. On eval, pt mod ind with bed mobility and transfers without AD, good steadiness without LOB, LUE assisting as needed. Pt amb 150 ft with supv, decreased bil arm swing with cadence WFL, rounded shoulder posture. Pt ascends/descends 10 steps with single L handrail (ascending), supv. With descent using same handrail to simulate home setup, pt with increased R hand pain, LOB forward requiring min A to recover, educated to use L hand on handrail as well to assist with improved steadiness and ability to complete stairs. Educated pt on bil handrail use as R hand is painful, pt verbalizes understanding of bil hand use and able to verbalize technique ascend/descend on bottom. Recommend HHPT at d/c. Pt currently with functional limitations due to the deficits listed below (see PT Problem List). Pt will benefit from acute skilled PT to increase their independence and safety with mobility to allow discharge.           If plan is discharge home, recommend the following: A little help with walking and/or transfers;A little help with bathing/dressing/bathroom;Assistance with cooking/housework;Assist for transportation;Help with stairs or ramp for entrance   Can travel by private vehicle        Equipment Recommendations None recommended by PT  Recommendations for Other Services       Functional Status Assessment Patient has had a recent decline in their  functional status and demonstrates the ability to make significant improvements in function in a reasonable and predictable amount of time.     Precautions / Restrictions Precautions Precautions: Fall Restrictions Weight Bearing Restrictions Per Provider Order: No      Mobility  Bed Mobility Overal bed mobility: Modified Independent                  Transfers Overall transfer level: Modified independent Equipment used: None                    Ambulation/Gait Ambulation/Gait assistance: Supervision Gait Distance (Feet): 150 Feet Assistive device: None Gait Pattern/deviations: WFL(Within Functional Limits) Gait velocity: WFL     General Gait Details: step through gait pattern, slight decreased bil arm swing, rounded shoulder posture  Stairs Stairs: Yes Stairs assistance: Min assist, Supervision Stair Management: One rail Left (ascending) Number of Stairs: 10 General stair comments: pt ascending 10 steps with L handrial, supv for safety; pt descends 10 steps with R handrail and increased pain in L hand, slight LOB forward requiring min A to recover, educated to reach across with L hand onto handrial to also steady, able to complete with supv; max education regarding technique and UE support on handrail with stair training to reduce risk for falls and pt able to verbalize back, also reports going on bottom at times  Wheelchair Mobility     Tilt Bed    Modified Rankin (Stroke Patients Only)       Balance Overall balance assessment: Mild deficits observed, not formally tested  Pertinent Vitals/Pain Pain Assessment Pain Assessment: 0-10 Pain Score: 5  Pain Location: R hand Pain Descriptors / Indicators:  (puffy) Pain Intervention(s): Limited activity within patient's tolerance, Monitored during session, Repositioned    Home Living Family/patient expects to be discharged to:: Private  residence Living Arrangements: Alone Available Help at Discharge: Friend(s);Available PRN/intermittently (friends and girlfriend) Type of Home: House Home Access: Stairs to enter Entrance Stairs-Rails: None Entrance Stairs-Number of Steps: 1-2 Alternate Level Stairs-Number of Steps: 10 Home Layout: Two level Home Equipment: Agricultural Consultant (2 wheels);Cane - single point      Prior Function Prior Level of Function : Independent/Modified Independent;History of Falls (last six months)             Mobility Comments: uses cane for mobility mostly ADLs Comments: soaks in tub, makes microwave meals, friends assist with transportation and assist with getting groceries. pt reports he has been signed up for transporation assist.     Extremity/Trunk Assessment   Upper Extremity Assessment Upper Extremity Assessment: Defer to OT evaluation RUE Deficits / Details: R UE with redness and edema. reports unable to make a fist but able to extend digits. able to to demo holding utensils to feed self but did ask for assist to open some containers. denies sensation changes RUE Coordination: decreased fine motor    Lower Extremity Assessment Lower Extremity Assessment: Overall WFL for tasks assessed (AROM WFL, denies numbness/tingling throughout)    Cervical / Trunk Assessment Cervical / Trunk Assessment: Normal  Communication   Communication Communication: No apparent difficulties    Cognition Arousal: Alert Behavior During Therapy: WFL for tasks assessed/performed   PT - Cognitive impairments: No apparent impairments                         Following commands: Intact       Cueing Cueing Techniques: Verbal cues     General Comments      Exercises     Assessment/Plan    PT Assessment Patient needs continued PT services  PT Problem List Decreased strength;Decreased activity tolerance;Decreased balance;Decreased mobility;Pain;Decreased skin integrity       PT  Treatment Interventions Gait training;Stair training;Functional mobility training;Therapeutic activities;Therapeutic exercise;Balance training;Neuromuscular re-education;Patient/family education    PT Goals (Current goals can be found in the Care Plan section)  Acute Rehab PT Goals Patient Stated Goal: return home PT Goal Formulation: With patient Time For Goal Achievement: 12/10/24 Potential to Achieve Goals: Good    Frequency Min 2X/week     Co-evaluation               AM-PAC PT 6 Clicks Mobility  Outcome Measure Help needed turning from your back to your side while in a flat bed without using bedrails?: None Help needed moving from lying on your back to sitting on the side of a flat bed without using bedrails?: None Help needed moving to and from a bed to a chair (including a wheelchair)?: None Help needed standing up from a chair using your arms (e.g., wheelchair or bedside chair)?: None Help needed to walk in hospital room?: A Little Help needed climbing 3-5 steps with a railing? : A Little 6 Click Score: 22    End of Session Equipment Utilized During Treatment: Gait belt Activity Tolerance: Patient tolerated treatment well;Patient limited by pain Patient left: in bed;with call bell/phone within reach;with bed alarm set Nurse Communication: Mobility status PT Visit Diagnosis: Unsteadiness on feet (R26.81);Pain Pain - Right/Left: Right Pain - part  of body: Hand    Time: 0825-0839 PT Time Calculation (min) (ACUTE ONLY): 14 min   Charges:   PT Evaluation $PT Eval Low Complexity: 1 Low   PT General Charges $$ ACUTE PT VISIT: 1 Visit         Tori Joanny Dupree PT, DPT 11/26/2024, 8:54 AM

## 2024-11-27 LAB — BASIC METABOLIC PANEL WITH GFR
Anion gap: 9 (ref 5–15)
BUN: 8 mg/dL (ref 8–23)
CO2: 22 mmol/L (ref 22–32)
Calcium: 8.6 mg/dL — ABNORMAL LOW (ref 8.9–10.3)
Chloride: 109 mmol/L (ref 98–111)
Creatinine, Ser: 0.78 mg/dL (ref 0.61–1.24)
GFR, Estimated: >60 mL/min
Glucose, Bld: 100 mg/dL — ABNORMAL HIGH (ref 70–99)
Potassium: 3.6 mmol/L (ref 3.5–5.1)
Sodium: 139 mmol/L (ref 135–145)

## 2024-11-27 LAB — GLUCOSE, CAPILLARY
Glucose-Capillary: 99 mg/dL (ref 70–99)
Glucose-Capillary: 125 mg/dL — ABNORMAL HIGH (ref 70–99)

## 2024-11-27 LAB — RENAL FUNCTION PANEL
Albumin: 3.4 g/dL — ABNORMAL LOW (ref 3.5–5.0)
Anion gap: 8 (ref 5–15)
BUN: 8 mg/dL (ref 8–23)
CO2: 22 mmol/L (ref 22–32)
Calcium: 8.6 mg/dL — ABNORMAL LOW (ref 8.9–10.3)
Chloride: 108 mmol/L (ref 98–111)
Creatinine, Ser: 0.78 mg/dL (ref 0.61–1.24)
GFR, Estimated: >60 mL/min
Glucose, Bld: 101 mg/dL — ABNORMAL HIGH (ref 70–99)
Phosphorus: 2.6 mg/dL (ref 2.5–4.6)
Potassium: 3.6 mmol/L (ref 3.5–5.1)
Sodium: 138 mmol/L (ref 135–145)

## 2024-11-27 LAB — MAGNESIUM: Magnesium: 2.1 mg/dL (ref 1.7–2.4)

## 2024-11-27 MED ORDER — POTASSIUM CHLORIDE CRYS ER 10 MEQ PO TBCR
10.0000 meq | EXTENDED_RELEASE_TABLET | Freq: Every day | ORAL | Status: DC
  Administered 2024-11-27 – 2024-11-29 (×3): 10 meq via ORAL
  Filled 2024-11-27 (×3): qty 1

## 2024-11-27 MED ORDER — BISMUTH SUBSALICYLATE 262 MG/15ML PO SUSP
30.0000 mL | Freq: Three times a day (TID) | ORAL | Status: DC
  Administered 2024-11-27 – 2024-11-29 (×4): 30 mL via ORAL
  Filled 2024-11-27: qty 236

## 2024-11-27 NOTE — Plan of Care (Signed)
   Problem: Education: Goal: Knowledge of General Education information will improve Description: Including pain rating scale, medication(s)/side effects and non-pharmacologic comfort measures Outcome: Progressing   Problem: Clinical Measurements: Goal: Ability to maintain clinical measurements within normal limits will improve Outcome: Progressing

## 2024-11-27 NOTE — Progress Notes (Signed)
 Mobility Specialist - Progress Note   11/27/24 1115  Mobility  Activity Ambulated with assistance  Level of Assistance Independent after set-up  Assistive Device None  Distance Ambulated (ft) 350 ft  Range of Motion/Exercises Active  Activity Response Tolerated well  Mobility Referral Yes  Mobility visit 1 Mobility  Mobility Specialist Start Time (ACUTE ONLY) 1047  Mobility Specialist Stop Time (ACUTE ONLY) 1058  Mobility Specialist Time Calculation (min) (ACUTE ONLY) 11 min   Received in bed and agreed to mobility, had no issues throughout session, returned to bed with all needs met.  Cyndee Ada Mobility Specialist

## 2024-11-27 NOTE — Progress Notes (Signed)
" °  Progress Note   Patient: Jose Li FMW:[id number] DOB: 12/19/1955 DOA: 11/25/2024     1 DOS: the patient was seen and examined on 11/27/2024   Brief hospital course: Jose Li is a 69 year old male past medical history of hypertension hyperlipidemia seizures, bipolar disorder, BPH... Presenting with a right hand cat bite.  Presented to ED with chief complaint of  my cath broke my finger, apparently was reaching out behind a washer to retrieve the cat where he received multiple bites from the cat on his right hand. Patient is unsure of vaccination record of the feline.  Patient reports now he has been having fever, chills.  Also noted some swelling and redness. Patient sustained an injury last Friday, now developing swelling redness, with subjective fever.     Assessment and Plan:  Cat bite: - Persistent edema, erythema, subjective fever - Follow-up with blood cultures - Continue IV Unasyn  and discontinue vancomycin  1/13. - Patient is slowly improving.       Hypokalemia - Replaced and resolved. - Continue home dose of potassium chloride  10 mEq daily   History of BPH Continue tamsulosin  and finasteride    Chronic kidney disease (CKD), stage 2:  -As per prior documentation. - Previous records reviewed, renal function is noted to be normal.  Plan  History of seizure - Continue Vimpat  and Keppra  at home dose  HLD - Continue atorvastatin   Anxiety/depression Continue buspirone , citalopram , trazodone , risperidone , Cogentin  at home dose                  Subjective: Feels better but he still complains of pain over the cat bite site  Physical Exam: Vitals:   11/26/24 1235 11/26/24 2019 11/27/24 0545 11/27/24 1234  BP: (!) 143/66 (!) 160/76 (!) 159/73 (!) 146/74  Pulse: 75 74 76 82  Resp: 17 16 18 18   Temp: 98.8 F (37.1 C) 98.4 F (36.9 C) 98.5 F (36.9 C) 99.1 F (37.3 C)  TempSrc: Oral   Oral  SpO2: 96% 100% 99% 99%  Weight:      Height:        Constitutional: Alert, awake, calm, comfortable HEENT: Neck supple Respiratory: Clear to auscultation B/L, no wheezing, no rales.  Cardiovascular: Regular rate and rhythm, no murmurs / rubs / gallops. No extremity edema. 2+ pedal pulses. No carotid bruits.  Abdomen: Soft, no tenderness, Bowel sounds positive.  Musculoskeletal: Right hand finger is swollen, tender and erythematous Skin: no rashes, lesions, ulcers. Neurologic: CN 2-12 grossly intact. Sensation intact, No focal deficit identified Psychiatric: Alert and oriented x 3. Normal mood.    Data Reviewed:  Reviewed  Family Communication: None  Disposition: Status is: Inpatient Remains inpatient appropriate because: Ongoing recovery from cat bite.  Planned Discharge Destination: Home    Time spent: 35 minutes  Author: Nena Rebel, MD 11/27/2024 2:17 PM  For on call review [email or web address].  "

## 2024-11-27 NOTE — Plan of Care (Signed)

## 2024-11-28 ENCOUNTER — Inpatient Hospital Stay (HOSPITAL_COMMUNITY)

## 2024-11-28 DIAGNOSIS — T148XXA Other injury of unspecified body region, initial encounter: Secondary | ICD-10-CM

## 2024-11-28 LAB — GLUCOSE, CAPILLARY: Glucose-Capillary: 109 mg/dL — ABNORMAL HIGH (ref 70–99)

## 2024-11-28 MED ORDER — IOHEXOL 300 MG/ML  SOLN
75.0000 mL | Freq: Once | INTRAMUSCULAR | Status: AC | PRN
  Administered 2024-11-28: 75 mL via INTRAVENOUS

## 2024-11-28 NOTE — Plan of Care (Signed)
   Problem: Education: Goal: Knowledge of General Education information will improve Description: Including pain rating scale, medication(s)/side effects and non-pharmacologic comfort measures Outcome: Progressing   Problem: Health Behavior/Discharge Planning: Goal: Ability to manage health-related needs will improve Outcome: Progressing   Problem: Clinical Measurements: Goal: Will remain free from infection Outcome: Progressing

## 2024-11-28 NOTE — Progress Notes (Signed)
 Occupational Therapy Treatment Patient Details Name: Jose Li MRN: [id number] DOB: 15-Mar-1956 Today's Date: 11/28/2024   History of present illness Pt is a 69 yr old male presenting after multiple cat bites to RUE and fever. Started on antibiotics to treat cellulitis. PMH:HTN, HLD, seizures, bipolar disorder, BPH   OT comments  The pt was received standing and ambulating in his room independently. He reported having mild R hand pain. Distal RUE edema and redness noted. OT instructed the pt on performing gentle RUE AROM, specifically shoulder flexion, elbow flexion and extension, wrist flexion and extension, digit opposition, and digits flexion and extension. OT emphasized the importance of performing ROM to help manage edema and to decrease the risk for joint stiffness. Pt performed teach back on the aforementioned with supervision in bed. OT further educated the pt on elevating his RUE periodically to assist with edema management, and also reinforced R hand/grip exercises using a light resistance exercise foam block. Pt presented with good participation and teach back. Continue OT plan of care. No post-acute care OT needs anticipates.       If plan is discharge home, recommend the following:  Assist for transportation   Equipment Recommendations  None recommended by OT    Recommendations for Other Services      Precautions / Restrictions Restrictions Weight Bearing Restrictions Per Provider Order: No RUE Weight Bearing Per Provider Order: Weight bearing as tolerated       Mobility Bed Mobility      General bed mobility comments: pt was received standing and ambulating in his room independently, upon this OT entering his room       Balance Overall balance assessment: No apparent balance deficits (not formally assessed)         ADL either performed or assessed with clinical judgement   ADL   Eating/Feeding: Set up;Sitting Eating/Feeding Details (indicate cue type and  reason): Pt reported having slight difficulty maintaining grasp of standard utensils for feeding, given R hand edema and associated fine motor coordination impairment. As such, OT educated him on using built-up utensils (foam piece placed on fork) to facilitate improved sustained grasp of utensils.          Extremity/Trunk Assessment Upper Extremity Assessment RUE Deficits / Details: distal R UE with redness and edema, unable to form fully closed fist due to edema, digit opposition WFL, denies sensation changes, wrist, elbow and shoulder AROM WFL RUE Coordination: decreased fine motor   Lower Extremity Assessment Lower Extremity Assessment: Overall WFL for tasks assessed        Vision Baseline Vision/History: 1 Wears glasses           Communication Communication Communication: No apparent difficulties   Cognition Arousal: Alert Behavior During Therapy: WFL for tasks assessed/performed        Following commands: Intact        Cueing   Cueing Techniques: Verbal cues             Pertinent Vitals/ Pain       Pain Assessment Pain Assessment: 0-10 Pain Score: 3  Pain Location: R hand Pain Intervention(s): Monitored during session, Limited activity within patient's tolerance   Frequency  Min 2X/week        Progress Toward Goals  OT Goals(current goals can now be found in the care plan section)  Progress towards OT goals: Progressing toward goals  Acute Rehab OT Goals Patient Stated Goal: to return home soon OT Goal Formulation: With patient Time For Goal Achievement:  12/10/24 Potential to Achieve Goals: Good  Plan         AM-PAC OT 6 Clicks Daily Activity     Outcome Measure   Help from another person eating meals?: A Little Help from another person taking care of personal grooming?: A Little Help from another person toileting, which includes using toliet, bedpan, or urinal?: None Help from another person bathing (including washing, rinsing,  drying)?: A Little Help from another person to put on and taking off regular upper body clothing?: None Help from another person to put on and taking off regular lower body clothing?: None 6 Click Score: 21    End of Session Equipment Utilized During Treatment: Other (comment) (foam piece for utensils, foam resistance block)  OT Visit Diagnosis: Pain;Muscle weakness (generalized) (M62.81) Pain - Right/Left: Right Pain - part of body: Hand   Activity Tolerance Patient tolerated treatment well   Patient Left in bed;with call bell/phone within reach;Other (comment) (MD present in room)   Nurse Communication Mobility status        Time: 8995-8985 OT Time Calculation (min): 10 min  Charges: OT General Charges $OT Visit: 1 Visit OT Treatments $Therapeutic Activity: 8-22 mins     Delanna JINNY Lesches, OTR/L 11/28/2024, 12:18 PM

## 2024-11-28 NOTE — Plan of Care (Signed)

## 2024-11-28 NOTE — Progress Notes (Signed)
 " [facility] Progress Note  Patient: Jose Li     FMW:[id number]  DOA: 11/25/2024   PCP: Diona Perkins, MD       Brief hospital course: 69 year old male with hypertension, seizure disorder, bipolar disorder presents to the hospital after cat bite to his right hand.  Admitted for cellulitis.  Subjective:  Right hand continues to be swollen and he has occasional pain in it.  Assessment and Plan: Principal Problem:   Animal bite - CT scan of the hand does not reveal any involvement of the tendons but does show edema - Continue Unasyn -hand is still considerably swollen  Active Problems:   Hypokalemia - Potassium was as low as 2.8 - Replaced  CKD stage II - Stable  Seizure disorder - Continue Vimpat  and Keppra   Bipolar disorder - Continue buspirone , citalopram , trazodone , risperidone  and Cogentin     Code Status: Full Code Total time on patient care: 35 minutes DVT prophylaxis:  heparin  injection 5,000 Units Start: 11/25/24 0815 SCDs Start: 11/25/24 0805     Objective:   Vitals:   11/27/24 1234 11/27/24 1952 11/28/24 0555 11/28/24 1313  BP: (!) 146/74 (!) 164/75 (!) 168/74 139/72  Pulse: 82 76 74 92  Resp: 18 17 17 16   Temp: 99.1 F (37.3 C) 99 F (37.2 C) 98.8 F (37.1 C) 98.3 F (36.8 C)  TempSrc: Oral Oral Oral   SpO2: 99% 96% 93% 95%  Weight:      Height:       Filed Weights   11/25/24 0513 11/26/24 0437  Weight: 78.6 kg 71.9 kg   Exam: General exam: Appears comfortable  HEENT: oral mucosa moist Respiratory system: Clear to auscultation.  Cardiovascular system: S1 & S2 heard  Gastrointestinal system: Abdomen soft, non-tender, nondistended. Normal bowel sounds   Extremities: No cyanosis, clubbing or edema of lower extremities-right hand is very edematous and wrist is as well, numerous abrasions noted on hand-no drainage Psychiatry:  Mood & affect appropriate.      CBC: Recent Labs  Lab 11/25/24 0527 11/26/24 0405  WBC 8.4 9.7   NEUTROABS 5.8  --   HGB 10.5* 11.5*  HCT 32.2* 34.5*  MCV 87.3 86.5  PLT 244 280   Basic Metabolic Panel: Recent Labs  Lab 11/25/24 0527 11/25/24 1540 11/26/24 0405 11/27/24 0049 11/27/24 0051  NA 141  --  141 139 138  K 2.8* 3.3* 3.3* 3.6 3.6  CL 109  --  110 109 108  CO2 21*  --  20* 22 22  GLUCOSE 114*  --  94 100* 101*  BUN 9  --  6* 8 8  CREATININE 0.88  --  0.79 0.78 0.78  CALCIUM  8.7*  --  8.6* 8.6* 8.6*  MG 1.8  --   --  2.1  --   PHOS 3.2  --   --   --  2.6     Scheduled Meds:  aspirin  EC  81 mg Oral Daily   atorvastatin   40 mg Oral Daily   benztropine   1 mg Oral QHS   bismuth  subsalicylate  30 mL Oral TID AC & HS   busPIRone   5 mg Oral BID   citalopram   20 mg Oral Daily   finasteride   5 mg Oral Daily   heparin   5,000 Units Subcutaneous Q8H   lacosamide   150 mg Oral BID   levETIRAcetam   1,500 mg Oral BID   multivitamin with minerals  1 tablet Oral Q breakfast   potassium chloride   10 mEq Oral Daily   pregabalin   75 mg Oral BID   risperiDONE   2 mg Oral QHS   sodium chloride  flush  3 mL Intravenous Q12H   sodium chloride  flush  3 mL Intravenous Q12H   tamsulosin   0.4 mg Oral QHS   thiamine   250 mg Oral Daily   traZODone   200 mg Oral QHS    Imaging and lab data personally reviewed   Author: Yovana Scogin  11/28/2024 6:48 PM  To contact [facility]>   Check the care team in [facility] and look for the attending/consulting [facility] provider listed  Log into [email or web address] and use [place]'s universal password   Go to> [facility]  and find provider  If you still have difficulty reaching the provider, please page the [facility] (Director on Call) for the Hospitalists listed on amion     "

## 2024-11-28 NOTE — Progress Notes (Signed)
 Patient voiced concerns to RN about needing to go home to his responsibilities. RN explained the importance of prioritizing his treatment to heal his hand and keep the infection from spreading up his arm, RN also explained the risks to his arm if that happens .SABRA... patient voiced he understands. RN will report to the following shift to reinforce with him the risks of leaving early.

## 2024-11-28 NOTE — Progress Notes (Signed)
 Physical Therapy Treatment Patient Details Name: Jose Li MRN: [id number] DOB: 12-17-1955 Today's Date: 11/28/2024   History of Present Illness Pt is a 69 y/o male presenting after multiple cat bites and fever. Started on antibiotics to treat cellulitis. PMH:HTN, HLD, seizures, bipolar disorder, BPH    PT Comments  Pt agreeable to session. Ambulates IND around unit x2 with PT managing IV pole. Review RUE exercises provided from OT, pt with no questions regarding exercises. Pt frustrated with still being in hospital, wanting to leave today. PT provided education as to why pt is still admitted and continued management of R hand swelling and medical management; pt continues to be frustrated but appreciates information. PT will continue to follow while he is admitted to work on overall endurance and activity tolerance. No follow up PT needed at this time as patient is now at his functional baseline   If plan is discharge home, recommend the following: A little help with walking and/or transfers;A little help with bathing/dressing/bathroom;Assistance with cooking/housework;Assist for transportation;Help with stairs or ramp for entrance   Can travel by private vehicle        Equipment Recommendations  None recommended by PT    Recommendations for Other Services       Precautions / Restrictions Precautions Precautions: Fall Restrictions Weight Bearing Restrictions Per Provider Order: No RUE Weight Bearing Per Provider Order: Weight bearing as tolerated     Mobility  Bed Mobility               General bed mobility comments: pt seated in recliner upon PT arrival    Transfers Overall transfer level: Independent Equipment used: None                    Ambulation/Gait Ambulation/Gait assistance: Supervision Gait Distance (Feet): 1000 Feet Assistive device: None Gait Pattern/deviations: WFL(Within Functional Limits) Gait velocity: WFL     General Gait Details:  step through gait pattern, slight decreased bil arm swing, rounded shoulder posture, intermittent lateral sway of gait path but no LOB; PT manages IV pole while ambulating   Stairs Stairs:  (declined stair practice, feels confident with steps stating I just have to go slow and be safe)           Wheelchair Mobility     Tilt Bed    Modified Rankin (Stroke Patients Only)       Balance Overall balance assessment: No apparent balance deficits (not formally assessed)                                          Communication Communication Communication: No apparent difficulties  Cognition Arousal: Alert Behavior During Therapy: WFL for tasks assessed/performed   PT - Cognitive impairments: No apparent impairments                         Following commands: Intact      Cueing Cueing Techniques: Verbal cues  Exercises Other Exercises Other Exercises: reviewed hand exercises to help with swelling    General Comments        Pertinent Vitals/Pain Pain Assessment Pain Assessment: 0-10 Pain Score: 4  Pain Location: R hand Pain Descriptors / Indicators: Tightness Pain Intervention(s): Limited activity within patient's tolerance, Monitored during session    Home Living  Prior Function            PT Goals (current goals can now be found in the care plan section) Acute Rehab PT Goals Patient Stated Goal: return home PT Goal Formulation: With patient Time For Goal Achievement: 12/10/24 Potential to Achieve Goals: Good Progress towards PT goals: Progressing toward goals    Frequency    Min 2X/week      PT Plan      Co-evaluation              AM-PAC PT 6 Clicks Mobility   Outcome Measure  Help needed turning from your back to your side while in a flat bed without using bedrails?: None Help needed moving from lying on your back to sitting on the side of a flat bed without using  bedrails?: None Help needed moving to and from a bed to a chair (including a wheelchair)?: None Help needed standing up from a chair using your arms (e.g., wheelchair or bedside chair)?: None Help needed to walk in hospital room?: None Help needed climbing 3-5 steps with a railing? : A Little 6 Click Score: 23    End of Session Equipment Utilized During Treatment: Gait belt Activity Tolerance: Patient tolerated treatment well Patient left: in chair;with call bell/phone within reach Nurse Communication: Mobility status PT Visit Diagnosis: Unsteadiness on feet (R26.81);Pain Pain - Right/Left: Right Pain - part of body: Hand     Time: 8487-8473 PT Time Calculation (min) (ACUTE ONLY): 14 min  Charges:    $Gait Training: 8-22 mins                       Isaiah DEL. Omarie Parcell, PT, DPT   [employer] 11/28/2024, 3:49 PM

## 2024-11-29 ENCOUNTER — Other Ambulatory Visit (HOSPITAL_COMMUNITY): Payer: Self-pay

## 2024-11-29 DIAGNOSIS — T148XXA Other injury of unspecified body region, initial encounter: Secondary | ICD-10-CM | POA: Diagnosis not present

## 2024-11-29 LAB — GLUCOSE, CAPILLARY: Glucose-Capillary: 106 mg/dL — ABNORMAL HIGH (ref 70–99)

## 2024-11-29 MED ORDER — AMOXICILLIN-POT CLAVULANATE 875-125 MG PO TABS
1.0000 | ORAL_TABLET | Freq: Two times a day (BID) | ORAL | 0 refills | Status: AC
  Filled 2024-11-29: qty 14, 7d supply, fill #0

## 2024-11-29 MED ORDER — MELATONIN 5 MG PO TABS
5.0000 mg | ORAL_TABLET | Freq: Once | ORAL | Status: DC
  Filled 2024-11-29: qty 1

## 2024-11-29 MED ORDER — AMOXICILLIN-POT CLAVULANATE 875-125 MG PO TABS: 1.0000 | ORAL_TABLET | Freq: Two times a day (BID) | ORAL | 0 refills | Status: DC

## 2024-11-29 NOTE — Plan of Care (Signed)

## 2024-11-29 NOTE — Discharge Summary (Signed)
 Physician Discharge Summary  Jose Li FMW:[id number] DOB: 05/14/1956 DOA: 11/25/2024  PCP: Diona Perkins, MD  Admit date: 11/25/2024 Discharge date: 11/29/2024     Discharge Diagnoses:   Principal Problem:   Animal bite with cellulitis Active Problems:   Hypokalemia   BIPOLAR DISORDER   Epilepsy [facility])     Brief hospital course: 69 year old male with hypertension, seizure disorder, bipolar disorder presents to the hospital after cat bite to his right hand.  Admitted for cellulitis.    Assessment and Plan: Principal Problem:   Animal bite with cellulitis - CT scan of the hand does not reveal any involvement of the tendons but does show edema - treated with Unasyn - swelling significantly decreased- transition to Augmentin  today   Active Problems:   Hypokalemia - Potassium was as low as 2.8 - Replaced   CKD stage II - Stable   Seizure disorder - Continue Vimpat  and Keppra    Bipolar disorder - Continue buspirone , citalopram , trazodone , risperidone  and Cogentin           Discharge Instructions   Allergies as of 11/29/2024       Reactions   Bee Venom Other (See Comments)   Exact reaction not recalled, but is allergic   Cymbalta  [duloxetine  Hcl] Other (See Comments)   Hyponatremia    Erythromycin Other (See Comments)   Reaction happened during childhood- not recalled   Hctz [hydrochlorothiazide ] Other (See Comments)   Syncope   Inderal  [propranolol ] Other (See Comments)   Syncope        Medication List     TAKE these medications    benztropine  1 MG tablet Commonly known as: COGENTIN  Take 1 mg by mouth at bedtime. The timing of this medication is very important.   amoxicillin -clavulanate 875-125 MG tablet Commonly known as: AUGMENTIN  Take 1 tablet by mouth 2 (two) times daily for 7 days.   aspirin  EC 81 MG tablet Take 81 mg by mouth in the morning.   atorvastatin  40 MG tablet Commonly known as: LIPITOR TAKE 1 TABLET BY MOUTH DAILY   bismuth   subsalicylate 262 MG/15ML suspension Commonly known as: PEPTO BISMOL Take 30 mLs by mouth every 6 (six) hours as needed for diarrhea or loose stools.   busPIRone  5 MG tablet Commonly known as: BUSPAR  Take 5 mg by mouth See admin instructions. Take 5 mg by mouth in the morning and afternoon   cetirizine  10 MG tablet Commonly known as: ZYRTEC  Take 10 mg by mouth daily.   citalopram  20 MG tablet Commonly known as: CELEXA  Take 20 mg by mouth in the morning.   feeding supplement Liqd Take 1 Container by mouth See admin instructions. Drink 1 container of CHOCOLATE Boost High Calorie formula liquid by mouth one to three times a day between meals as needed for nutritional supplementation   finasteride  5 MG tablet Commonly known as: PROSCAR  Take 1 tablet (5 mg total) by mouth in the morning.   folic acid  1 MG tablet Commonly known as: FOLVITE  Take 1 tablet (1 mg total) by mouth daily.   Lacosamide  150 MG Tabs Commonly known as: Vimpat  Take 1 tablet (150 mg total) by mouth in the morning and at bedtime.   levETIRAcetam  750 MG tablet Commonly known as: KEPPRA  TAKE 2 TABLETS BY MOUTH TWICE  DAILY   midodrine  5 MG tablet Commonly known as: PROAMATINE  Take 5-10 mg by mouth See admin instructions. Take 10 mg by mouth in the morning and 5 mg at 2 PM- WITH MEALS   multivitamin  tablet Take 1 tablet by mouth daily with breakfast.   ondansetron  4 MG disintegrating tablet Commonly known as: ZOFRAN -ODT DISSOLVE 1 TABLET IN MOUTH EVERY 8 HOURS AS NEEDED FOR NAUSEA OR VOMITING What changed: See the new instructions.   OSTEO BI-FLEX ONE PER DAY PO Take 1 tablet by mouth every other day.   potassium chloride  10 MEQ tablet Commonly known as: KLOR-CON  Take 10 mEq by mouth daily.   pregabalin  75 MG capsule Commonly known as: LYRICA  TAKE 1 CAPSULE BY MOUTH TWICE  DAILY   risperiDONE  2 MG tablet Commonly known as: RISPERDAL  Take 2 mg by mouth at bedtime.   tamsulosin  0.4 MG Caps  capsule Commonly known as: FLOMAX  Take 0.4 mg by mouth at bedtime.   thiamine  250 MG tablet Take 250 mg by mouth daily.   traZODone  100 MG tablet Commonly known as: DESYREL  Take 300 mg by mouth at bedtime.   Tylenol  8 Hour Arthritis Pain 650 MG CR tablet Generic drug: acetaminophen  Take 650 mg by mouth every 8 (eight) hours as needed for pain (WHEN NOT TAKING THE 500 MG STRENGTH).   TYLENOL  500 MG tablet Generic drug: acetaminophen  Take 500 mg by mouth every 8 (eight) hours as needed (for pain- WHEN NOT TAKING THE 650 MG STRENGTH).   VICKS VAPOR INHALER IN Place 1 Inhalation into both nostrils as needed (for congestion).            The results of significant diagnostics from this hospitalization (including imaging, microbiology, ancillary and laboratory) are listed below for reference.    CT HAND RIGHT W CONTRAST Result Date: 11/28/2024 CLINICAL DATA:  Hand pain, swelling, and redness after recent cat bite. EXAM: CT OF THE UPPER RIGHT EXTREMITY WITH CONTRAST TECHNIQUE: Multidetector CT imaging of the upper right extremity was performed according to the standard protocol following intravenous contrast administration. RADIATION DOSE REDUCTION: This exam was performed according to the departmental dose-optimization program which includes automated exposure control, adjustment of the mA and/or kV according to patient size and/or use of iterative reconstruction technique. CONTRAST:  75mL OMNIPAQUE  IOHEXOL  300 MG/ML  SOLN COMPARISON:  Radiographs of the right hand dated 11/25/2024. FINDINGS: Bones/Joint/Cartilage No acute fracture dislocation. No evidence of acute osteolysis or erosive changes. Remote healed distal radial styloid fracture. Mild radiocarpal joint space narrowing. Mild osteoarthritis of the first carpometacarpal and interphalangeal joints. Mild joint space narrowing of the second through fifth distal interphalangeal joints. Ligaments Ligaments are suboptimally evaluated by CT.  Muscles and Tendons Muscles are unremarkable.  No intramuscular fluid collection. Soft tissue Cutaneous thickening and subcutaneous edema of the second digit. Subcutaneous edema extending along the dorsal hand and wrist. No loculated fluid collection. No soft tissue gas. No radiopaque foreign body. IMPRESSION: 1. Cutaneous thickening and subcutaneous edema of the second digit. Subcutaneous edema extending along the dorsal hand and wrist. These findings are compatible with cellulitis in the appropriate setting. No loculated fluid collection. No soft tissue gas. 2. No acute osseous abnormality. Electronically Signed   By: Harrietta Sherry M.D.   On: 11/28/2024 14:55   DG Hand Complete Right Result Date: 11/25/2024 EXAM: 3 OR MORE VIEW(S) XRAY OF THE HAND 11/25/2024 06:05:54 AM COMPARISON: Right wrist series 01/28/2016. CLINICAL HISTORY: 69 year old male with pain, swelling, and multiple cat bites. FINDINGS: BONES AND JOINTS: Distal radial styloid fracture appears healed since 2017. No acute fracture. No malalignment. SOFT TISSUES: Generalized soft tissue swelling. And especially swelling of the second digit. No soft tissue gas. No radiopaque foreign body. IMPRESSION: 1.  No acute osseous abnormality identified about the right hand. 2. Soft tissue swelling, greatest at the second digit; no gas or radiopaque foreign body. Electronically signed by: Helayne Hurst MD MD 11/25/2024 06:16 AM EST RP Workstation: [id number]   Labs:   Basic Metabolic Panel: Recent Labs  Lab 11/25/24 0527 11/25/24 1540 11/26/24 0405 11/27/24 0049 11/27/24 0051  NA 141  --  141 139 138  K 2.8* 3.3* 3.3* 3.6 3.6  CL 109  --  110 109 108  CO2 21*  --  20* 22 22  GLUCOSE 114*  --  94 100* 101*  BUN 9  --  6* 8 8  CREATININE 0.88  --  0.79 0.78 0.78  CALCIUM  8.7*  --  8.6* 8.6* 8.6*  MG 1.8  --   --  2.1  --   PHOS 3.2  --   --   --  2.6     CBC: Recent Labs  Lab 11/25/24 0527 11/26/24 0405  WBC 8.4 9.7  NEUTROABS 5.8   --   HGB 10.5* 11.5*  HCT 32.2* 34.5*  MCV 87.3 86.5  PLT 244 280         SIGNED:   True Atlas, MD  [facility] 11/29/2024, 8:36 AM Time taking on discharge: 50 minutes

## 2024-11-30 ENCOUNTER — Telehealth: Payer: Self-pay

## 2024-11-30 NOTE — Transitions of Care (Post Inpatient/ED Visit) (Unsigned)
" ° °  11/30/2024  Name: Jose Li MRN: [id number] DOB: [birth year]  Today's TOC FU Call Status: Today's TOC FU Call Status:: Unsuccessful Call (1st Attempt) Unsuccessful Call (1st Attempt) Date: 11/30/24  Attempted to reach the patient regarding the most recent Inpatient/ED visit.  Follow Up Plan: Additional outreach attempts will be made to reach the patient to complete the Transitions of Care (Post Inpatient/ED visit) call.   Signature Julian Lemmings, LPN [facility] Nurse Health Advisor Direct Dial [phone]  "

## 2024-12-03 NOTE — Transitions of Care (Post Inpatient/ED Visit) (Unsigned)
" ° °  12/03/2024  Name: Jose Li MRN: [id number] DOB: 1956/04/05  Today's TOC FU Call Status: Today's TOC FU Call Status:: Unsuccessful Call (2nd Attempt) Unsuccessful Call (1st Attempt) Date: 11/30/24 Unsuccessful Call (2nd Attempt) Date: 12/03/24  Attempted to reach the patient regarding the most recent Inpatient/ED visit.  Follow Up Plan: Additional outreach attempts will be made to reach the patient to complete the Transitions of Care (Post Inpatient/ED visit) call.   Signature Julian Lemmings, LPN [facility] Nurse Health Advisor Direct Dial [phone]  "

## 2024-12-04 NOTE — Transitions of Care (Post Inpatient/ED Visit) (Signed)
" ° °  12/04/2024  Name: Jose Li MRN: [id number] DOB: July 04, 1956  Today's TOC FU Call Status: Today's TOC FU Call Status:: Unsuccessful Call (2nd Attempt) Unsuccessful Call (1st Attempt) Date: 11/30/24 Unsuccessful Call (2nd Attempt) Date: 12/03/24  Attempted to reach the patient regarding the most recent Inpatient/ED visit.  Follow Up Plan: No further outreach attempts will be made at this time. We have been unable to contact the patient.  Signature Julian Lemmings, LPN [facility] Nurse Health Advisor Direct Dial [phone]  "

## 2024-12-06 ENCOUNTER — Telehealth: Payer: Self-pay

## 2024-12-06 ENCOUNTER — Telehealth: Payer: Self-pay | Admitting: *Deleted

## 2024-12-06 DIAGNOSIS — L03113 Cellulitis of right upper limb: Secondary | ICD-10-CM

## 2024-12-06 NOTE — Progress Notes (Unsigned)
 Complex Care Management Note Care Guide Note  12/06/2024 Name: Jose Li MRN: [id number] DOB: [birth year]   Complex Care Management Outreach Attempts: An unsuccessful telephone outreach was attempted today to offer the patient information about available complex care management services.  Follow Up Plan:  Additional outreach attempts will be made to offer the patient complex care management information and services.   Encounter Outcome:  No Answer  Harlene Satterfield  [facility] Health  [facility], [facility] Guide  Direct Dial: [phone]  Fax [phone]

## 2024-12-07 NOTE — Progress Notes (Signed)
 Complex Care Management Note  Care Guide Note 12/07/2024 Name: Jose Li MRN: [id number] DOB: 1956/06/08  Jose Li is a 69 y.o. year old male who sees Diona Perkins, MD for primary care. I reached out to Arley JONETTA Dixons by phone today to offer complex care management services.  Mr. Pavao was given information about Complex Care Management services today including:   The Complex Care Management services include support from the care team which includes your Nurse Care Manager, Clinical Social Worker, or Pharmacist.  The Complex Care Management team is here to help remove barriers to the health concerns and goals most important to you. Complex Care Management services are voluntary, and the patient may decline or stop services at any time by request to their care team member.   Complex Care Management Consent Status: Patient agreed to services and verbal consent obtained.   Follow up plan:  Telephone appointment with complex care management team member scheduled for:  12/14/24  Encounter Outcome:  Patient Scheduled  Harlene Satterfield  [facility] Health  [facility], [facility] Guide  Direct Dial: [phone]  Fax [phone]

## 2024-12-11 ENCOUNTER — Ambulatory Visit: Admitting: Family Medicine

## 2024-12-14 ENCOUNTER — Other Ambulatory Visit: Payer: Self-pay

## 2024-12-14 NOTE — Patient Outreach (Signed)
 LCSW attempted to call patient at scheduled time and call went straight to voicemail. LCSW was unable to leave voicemail because it was full. LCSW will send back to scheduling guide to reschedule.  Olam Ally, MSW, LCSW [place]  [facility], [facility] Licensed Clinical Social Worker Direct Dial: [phone]

## 2024-12-14 NOTE — Patient Instructions (Signed)
 Arley JONETTA Dixons - I am sorry I was unable to reach you today for our scheduled appointment. I work with Diona Perkins, MD and am calling to support your healthcare needs. Please contact me at [phone]  at your earliest convenience. I look forward to speaking with you soon.   Thank you,  Olam Ally, MSW, LCSW [place]  [facility], [facility] Licensed Clinical Social Worker Direct Dial: [phone]

## 2024-12-15 ENCOUNTER — Other Ambulatory Visit: Payer: Self-pay | Admitting: Internal Medicine

## 2024-12-18 ENCOUNTER — Ambulatory Visit: Admitting: Family Medicine

## 2024-12-18 ENCOUNTER — Telehealth: Payer: Self-pay | Admitting: *Deleted

## 2024-12-18 VITALS — BP 127/70 | HR 99 | Ht 71.0 in | Wt 163.1 lb

## 2024-12-18 DIAGNOSIS — F17200 Nicotine dependence, unspecified, uncomplicated: Secondary | ICD-10-CM | POA: Diagnosis not present

## 2024-12-18 DIAGNOSIS — T148XXA Other injury of unspecified body region, initial encounter: Secondary | ICD-10-CM

## 2024-12-18 NOTE — Assessment & Plan Note (Addendum)
 Hospital follow-up for cellulitis due to animal bite. Consistent with resolving cellulitis today. No red flags.  Given extent of swelling still today will have patient follow-up in 1 week to ensure that it is continuing to improve.  In comparison to prior photos it has significantly improved.   - Directed to continue his physical therapy - Discussed return precautions

## 2024-12-18 NOTE — Progress Notes (Unsigned)
 Complex Care Management Care Guide Note  12/18/2024 Name: Jose Li MRN: [id number] DOB: Jan 12, 1956  Jose Li is a 69 y.o. year old male who is a primary care patient of Diona Perkins, MD and is actively engaged with the care management team. I reached out to Arley JONETTA Dixons by phone today to assist with re-scheduling  with the Licensed Clinical Child Psychotherapist.  Follow up plan: Unsuccessful telephone outreach attempt made. A HIPAA compliant phone message was left for the patient providing contact information and requesting a return call.  Harlene Satterfield  [facility]  [facility], [facility] Guide  Direct Dial: [phone]  Fax [phone]

## 2024-12-19 NOTE — Progress Notes (Signed)
 Complex Care Management Care Guide Note  12/19/2024 Name: [name] MRN: [id number] DOB: [birth year]  Jose Li is a 68 y.o. year old male who is a primary care patient of Diona Perkins, MD and is actively engaged with the care management team. I reached out to Arley JONETTA Dixons by phone today to assist with re-scheduling  with the Licensed Clinical Child Psychotherapist.  Follow up plan: Telephone appointment with complex care management team member scheduled for:  12/25/24  Harlene Satterfield  [facility] Health  [facility], [facility] Guide  Direct Dial: [phone]  Fax [phone]

## 2024-12-21 ENCOUNTER — Other Ambulatory Visit: Payer: Self-pay | Admitting: Neurology

## 2024-12-21 NOTE — Telephone Encounter (Signed)
 Requested Prescriptions   Pending Prescriptions Disp Refills   Lacosamide  (VIMPAT ) 150 MG TABS 60 tablet 5    Sig: Take 1 tablet (150 mg total) by mouth in the morning and at bedtime.   Last seen 02/01/24 Next appt not scheduled (needed 6 month follow up) ROUTING TO PHONE ROOM TO SCHEDULE APPTAND TO PROVIDER TO GIVE ONLY ONE MONTH   Dispenses   Dispensed Days Supply Quantity Provider Pharmacy  LACOSAMIDE  150MG     TAB 10/27/2024 30 60 each Sater, Charlie LABOR, MD [facility] [phone] ...  LACOSAMIDE  150MG     TAB 10/01/2024 30 60 each Gayland Lauraine PARAS, NP [facility] [phone] ...  LACOSAMIDE  150MG     TAB 08/20/2024 30 60 each Gayland Lauraine PARAS, NP [facility] [phone] ...  LACOSAMIDE   150 MG TABS 07/27/2024 30 60 tablet Gayland Lauraine PARAS, NP [facility]  LACOSAMIDE   150 MG TABS 06/23/2024 30 60 tablet Gayland Lauraine PARAS, NP [facility]  LACOSAMIDE   150 MG TABS 05/19/2024 30 60 tablet Gayland Lauraine PARAS, NP [facility]  LACOSAMIDE   150 MG TABS 04/20/2024 30 60 tablet Gayland Lauraine PARAS, NP [facility]  LACOSAMIDE   150 MG TABS 02/29/2024 30 60 tablet Gayland Lauraine PARAS, NP [facility]  LACOSAMIDE   150 MG TABS 02/01/2024 30 60 tablet Gayland Lauraine PARAS, NP [facility]  LACOSAMIDE  100MG     TAB 01/13/2024 30 60 each Gregg Lek, MD [facility] [phone] .SABRASABRA

## 2024-12-21 NOTE — Telephone Encounter (Signed)
 Last seen March 2025, will be due for revisit, please have him schedule. Vimpat  was refilled on 10/27/24 with # 5 refills, should not need any new prescription.

## 2024-12-21 NOTE — Telephone Encounter (Signed)
 Pt is requesting a refill for Lacosamide  (VIMPAT ) 150 MG TABS .  Pharmacy: [id number]

## 2024-12-25 ENCOUNTER — Telehealth

## 2024-12-25 ENCOUNTER — Ambulatory Visit: Payer: Self-pay | Admitting: Family Medicine

## 2025-01-07 ENCOUNTER — Ambulatory Visit: Payer: Self-pay | Admitting: Family Medicine

## 2025-02-14 ENCOUNTER — Ambulatory Visit: Admitting: Urology

## 2025-04-26 ENCOUNTER — Ambulatory Visit: Admitting: Internal Medicine

## 2025-05-13 ENCOUNTER — Encounter
